# Patient Record
Sex: Female | Born: 1940 | Race: Black or African American | Hispanic: No | Smoking: Former smoker
Health system: Southern US, Community
[De-identification: ages and names within clinical notes are randomized; demographics above are authoritative.]

## PROBLEM LIST (undated history)

## (undated) DIAGNOSIS — R569 Unspecified convulsions: Secondary | ICD-10-CM

## (undated) DIAGNOSIS — R059 Cough, unspecified: Secondary | ICD-10-CM

## (undated) DIAGNOSIS — N183 Chronic kidney disease, stage 3 unspecified: Secondary | ICD-10-CM

## (undated) DIAGNOSIS — I10 Essential (primary) hypertension: Secondary | ICD-10-CM

## (undated) DIAGNOSIS — J189 Pneumonia, unspecified organism: Secondary | ICD-10-CM

## (undated) DIAGNOSIS — N179 Acute kidney failure, unspecified: Secondary | ICD-10-CM

## (undated) DIAGNOSIS — I639 Cerebral infarction, unspecified: Secondary | ICD-10-CM

## (undated) DIAGNOSIS — I509 Heart failure, unspecified: Secondary | ICD-10-CM

## (undated) DIAGNOSIS — G40909 Epilepsy, unspecified, not intractable, without status epilepticus: Secondary | ICD-10-CM

## (undated) DIAGNOSIS — F17200 Nicotine dependence, unspecified, uncomplicated: Secondary | ICD-10-CM

## (undated) HISTORY — DX: Acute kidney failure, unspecified: N17.9

## (undated) HISTORY — PX: NO PAST SURGERIES: SHX2092

## (undated) HISTORY — DX: Chronic kidney disease, stage 3 unspecified: N18.30

## (undated) HISTORY — DX: Pneumonia, unspecified organism: J18.9

## (undated) HISTORY — DX: Cerebral infarction, unspecified: I63.9

## (undated) HISTORY — DX: Unspecified convulsions: R56.9

## (undated) HISTORY — DX: Cough, unspecified: R05.9

---

## 2001-03-10 ENCOUNTER — Emergency Department (HOSPITAL_COMMUNITY): Admission: EM | Admit: 2001-03-10 | Discharge: 2001-03-10 | Payer: Self-pay | Admitting: Emergency Medicine

## 2005-07-20 ENCOUNTER — Emergency Department (HOSPITAL_COMMUNITY): Admission: EM | Admit: 2005-07-20 | Discharge: 2005-07-20 | Payer: Self-pay | Admitting: Emergency Medicine

## 2005-07-20 ENCOUNTER — Ambulatory Visit: Payer: Self-pay | Admitting: Family Medicine

## 2005-10-31 ENCOUNTER — Encounter: Payer: Self-pay | Admitting: Internal Medicine

## 2005-10-31 ENCOUNTER — Inpatient Hospital Stay (HOSPITAL_COMMUNITY): Admission: EM | Admit: 2005-10-31 | Discharge: 2005-11-03 | Payer: Self-pay | Admitting: Internal Medicine

## 2005-11-01 ENCOUNTER — Encounter (INDEPENDENT_AMBULATORY_CARE_PROVIDER_SITE_OTHER): Payer: Self-pay | Admitting: Interventional Cardiology

## 2005-11-01 ENCOUNTER — Encounter: Payer: Self-pay | Admitting: Vascular Surgery

## 2007-06-28 ENCOUNTER — Other Ambulatory Visit: Admission: RE | Admit: 2007-06-28 | Discharge: 2007-06-28 | Payer: Self-pay | Admitting: Family Medicine

## 2008-06-10 ENCOUNTER — Inpatient Hospital Stay (HOSPITAL_COMMUNITY): Admission: EM | Admit: 2008-06-10 | Discharge: 2008-06-11 | Payer: Self-pay | Admitting: Emergency Medicine

## 2008-06-10 ENCOUNTER — Emergency Department (HOSPITAL_COMMUNITY): Admission: EM | Admit: 2008-06-10 | Discharge: 2008-06-10 | Payer: Self-pay | Admitting: Family Medicine

## 2010-05-11 LAB — CBC
HCT: 32.5 % — ABNORMAL LOW (ref 36.0–46.0)
HCT: 37.8 % (ref 36.0–46.0)
Hemoglobin: 11.2 g/dL — ABNORMAL LOW (ref 12.0–15.0)
Hemoglobin: 12.9 g/dL (ref 12.0–15.0)
MCHC: 34.2 g/dL (ref 30.0–36.0)
MCHC: 34.5 g/dL (ref 30.0–36.0)
MCV: 96.5 fL (ref 78.0–100.0)
MCV: 97.1 fL (ref 78.0–100.0)
Platelets: 250 10*3/uL (ref 150–400)
Platelets: 272 10*3/uL (ref 150–400)
RBC: 3.35 MIL/uL — ABNORMAL LOW (ref 3.87–5.11)
RBC: 3.92 MIL/uL (ref 3.87–5.11)
RDW: 16.7 % — ABNORMAL HIGH (ref 11.5–15.5)
RDW: 16.8 % — ABNORMAL HIGH (ref 11.5–15.5)
WBC: 5.7 10*3/uL (ref 4.0–10.5)
WBC: 7.1 10*3/uL (ref 4.0–10.5)

## 2010-05-11 LAB — DIFFERENTIAL
Basophils Absolute: 0 10*3/uL (ref 0.0–0.1)
Basophils Relative: 1 % (ref 0–1)
Eosinophils Absolute: 0.3 10*3/uL (ref 0.0–0.7)
Eosinophils Relative: 5 % (ref 0–5)
Lymphocytes Relative: 19 % (ref 12–46)
Lymphs Abs: 1.1 10*3/uL (ref 0.7–4.0)
Monocytes Absolute: 0.5 10*3/uL (ref 0.1–1.0)
Monocytes Relative: 8 % (ref 3–12)
Neutro Abs: 3.9 10*3/uL (ref 1.7–7.7)
Neutrophils Relative %: 68 % (ref 43–77)

## 2010-05-11 LAB — BASIC METABOLIC PANEL
BUN: 24 mg/dL — ABNORMAL HIGH (ref 6–23)
CO2: 28 mEq/L (ref 19–32)
Calcium: 9.1 mg/dL (ref 8.4–10.5)
Chloride: 102 mEq/L (ref 96–112)
Creatinine, Ser: 1.07 mg/dL (ref 0.4–1.2)
GFR calc Af Amer: >60 mL/min (ref >60)
GFR calc non Af Amer: 51 mL/min — ABNORMAL LOW (ref >60)
Glucose, Bld: 90 mg/dL (ref 70–99)
Potassium: 4.6 mEq/L (ref 3.5–5.1)
Sodium: 137 mEq/L (ref 135–145)

## 2010-05-11 LAB — LIPASE, BLOOD: Lipase: 36 U/L (ref 11–59)

## 2010-05-11 LAB — POCT I-STAT, CHEM 8
BUN: 26 mg/dL — ABNORMAL HIGH (ref 6–23)
Calcium, Ion: 1.03 mmol/L — ABNORMAL LOW (ref 1.12–1.32)
Chloride: 108 mEq/L (ref 96–112)
Creatinine, Ser: 1.2 mg/dL (ref 0.4–1.2)
Glucose, Bld: 84 mg/dL (ref 70–99)
HCT: 42 % (ref 36.0–46.0)
Hemoglobin: 14.3 g/dL (ref 12.0–15.0)
Potassium: 5.1 mEq/L (ref 3.5–5.1)
Sodium: 136 mEq/L (ref 135–145)
TCO2: 21 mmol/L (ref 0–100)

## 2010-05-11 LAB — POCT CARDIAC MARKERS
CKMB, poc: 2.3 ng/mL (ref 1.0–8.0)
Myoglobin, poc: 146 ng/mL (ref 12–200)
Troponin i, poc: <0.05 ng/mL (ref 0.00–0.09)

## 2010-06-15 NOTE — Discharge Summary (Signed)
[name], [name]               ACCOUNT NO.:  [id number]   MEDICAL RECORD NO.:  [id number]          PATIENT TYPE:  INP   LOCATION:  4743                         FACILITY:  [facility]   PHYSICIAN:  Corky Crafts, MDDATE OF BIRTH:  19-Aug-1940   DATE OF ADMISSION:  06/10/2008  DATE OF DISCHARGE:  06/11/2008                               DISCHARGE SUMMARY   DISCHARGE DIAGNOSES:  1. Chest pain, noncardiac with normal coronaries.  2. Hypertension.  3. Long-term medication use.  4. History of ovarian cyst.  5. History of prior heart surgery as a child.  6. Smoking, smoking cessation counseling.   HOSPITAL COURSE:  [name] is a 70 year old female who awoke yesterday  with severe substernal chest pain and pressure and nausea.  She waited  several hours and went to Urgent Care around 11:30 a.m.  She was then  transferred to [facility].  Her EKG showed anterior Q-waves with ST-  segment elevation in leads V3-V2.  This was worrisome for late  presenting STEMI.  Q-waves were present.  She was taken then emergently  to the Cardiac Catheterization Lab where she had no significant coronary  artery disease.  Her LV function was normal.  She had no abdominal  aortic aneurysm and her hemodynamics were normal.  This was felt to be  noncardiac in nature.  She was kept in the hospital overnight.   LABORATORY DATA:  Her lab studies showed a sodium of 137, potassium 4.6,  BUN 24, creatinine 1.07, and glucose 90.   DISCHARGE MEDICATIONS:  She is to resume her lisinopril as prior to  admission and baby aspirin 81 mg a day.   DISCHARGE INSTRUCTIONS:  Remain on the low-sodium heart-healthy diet.  Clean the groin site gently with soap and water.  Increase her activity  slowly.  No lifting over 10 pounds for 1 week.  No driving for 2 days.  Follow up with Dr. Eldridge Dace on July 02, 2008 at 3:30 p.m.      Guy Franco, P.A.      Corky Crafts, MD  Electronically Signed    LB/MEDQ  D:   06/11/2008  T:  06/12/2008  Job:  [id number]

## 2010-06-15 NOTE — H&P (Signed)
[name], [name]               ACCOUNT NO.:  [id number]   MEDICAL RECORD NO.:  [id number]           PATIENT TYPE:   LOCATION:                                 FACILITY:   PHYSICIAN:  Corky Crafts, MD     DATE OF BIRTH:   DATE OF ADMISSION:  DATE OF DISCHARGE:                              HISTORY & PHYSICAL   PRIMARY CARE PHYSICIAN:  Brett Canales A. Daub, MD   CHIEF COMPLAINT:  Chest discomfort with EKG changes.   HISTORY OF PRESENT ILLNESS:  The patient is a 70 year old woman who woke  up this morning about 4 a.m. with severe chest pressure and nausea.  She  waited several hours despite severe pain and went to an Urgent Care  Clinic around 11:30 this morning.  She was transferred to the [facility]  Emergency Room.  There was a question of ST elevation in the anterior  leads with Q-waves and code STEMI was activated.  She still having about  1/10 chest pain just prior to the catheterization.   PAST MEDICAL HISTORY:  Hypertension.   PAST SURGICAL HISTORY:  Ovarian cyst, prior heart surgery as a child.   ALLERGIES:  No known drug allergies.   MEDICATIONS:  Lisinopril and aspirin 325 mg day.   SOCIAL HISTORY:  She smokes a half-pack per day, but smoked much more in  the past.  She rarely drinks alcohol.  She does drink beer.  She lives  alone.  She works at [employer].   FAMILY HISTORY:  No coronary artery disease.   REVIEW OF SYSTEMS:  Significant for the chest pain and nausea as  mentioned above.  She also has leg cramps on a regular basis.  No recent  swelling, shortness of breath, or syncope.  All other systems negative.   PHYSICAL EXAMINATION:  VITAL SIGNS:  Blood pressure is 155/100, heart  rate of 72.  GENERAL:  She is awake and alert in no apparent distress.  HEAD:  Normocephalic, atraumatic.  EYES:  Extraocular movements intact.  NECK:  No JVD.  CARDIOVASCULAR:  Regular rate and rhythm.  LUNGS:  No audible wheezing.  ABDOMEN:  Soft, nontender.  EXTREMITIES:  2+  right femoral pulse.  No edema.  NEURO:  No focal deficits.  SKIN:  No rash.  PSYCH:  Normal mood and affect.   ECG shows possible right atrial enlargement, anterior Q-waves with poor  R-wave progression, 1-mm ST elevation in leads V2 and V3, prolonged QT  interval.   ASSESSMENT:  A 70 year old with chest pain and EKG changes worrisome for  acute myocardial infarction.   PLAN:  The risks and benefits of cardiac cath were briefly explained to  the patient and she is agreeable to the procedure.  Her course will be  determined by the results of the catheterization regardless she should  stop smoking.  We will continue lisinopril to control her blood  pressure.      Corky Crafts, MD  Electronically Signed     JSV/MEDQ  D:  06/10/2008  T:  06/11/2008  Job:  [id number]

## 2010-06-15 NOTE — Cardiovascular Report (Signed)
[name], [name]               ACCOUNT NO.:  [id number]   MEDICAL RECORD NO.:  [id number]          PATIENT TYPE:  INP   LOCATION:  4743                         FACILITY:  [facility]   PHYSICIAN:  Corky Crafts, MDDATE OF BIRTH:  March 02, 1940   DATE OF PROCEDURE:  DATE OF DISCHARGE:                            CARDIAC CATHETERIZATION   PROCEDURES PERFORMED:  Left heart catheterization, left ventriculogram,  coronary angiogram, abdominal aortogram.   OPERATOR:  Corky Crafts, MD   INDICATIONS:  Suspect anterior STEMI.   PROCEDURE NARRATIVE:  The patient was brought emergently to the cath lab  for angiography.  Her right groin was prepped and draped in the usual  sterile fashion.  A 6-French sheath was placed into the right femoral  artery using the modified Seldinger technique.  Right coronary artery  angiography was performed using a JR-4 pigtail catheter.  The catheter  was advanced to the vessel ostium under fluoroscopic guidance.  Digital  angiography was performed using hand injection of contrast.  Left  coronary artery angiography was performed using JL-4 pigtail catheter.  Catheter was advanced to the vessel ostium under fluoroscopic guidance.  Digital angiography was performed in multiple projections using hand  injection of contrast.  Pigtail catheter was advanced to the ascending  aorta and across the aortic valve under fluoroscopic guidance.  The  catheter was used for a power injection to image the left ventricle.  The catheter was pulled back under continuous hemodynamic pressure  monitoring.  Catheter was then withdrawn to the abdominal aorta and a  power injection of contrast was performed.  The sheath will be removed  using manual compression.   FINDINGS:  1. The right coronary artery is a small nondominant vessel but is      patent.  2. The left main artery is widely patent.  3. The left circumflex is a large-dominant vessel with mild  irregularities.  The OM-1 and OM-2 are small vessels which are      widely patent.  The OM-3 is a medium-sized vessel which is widely      patent.  4. The left anterior descending is a large vessel which wraps around      the apex.  There are mild irregularities in the mid vessel.  There      is a large first diagonal which is angiographically normal.  5. The left ventriculogram shows normal ventricular function with an      LVEF of 55%.  6. Left ventricular pressure 94/0 with an LVEDP of 5 mmHg.  Aortic      pressure 91/57 with a median aortic pressure of 72 mmHg.  7. The abdominal aortogram showed no abdominal aortic aneurysm.  There      is a single left renal artery which is patent.  There is a single      right renal artery which had a 25% proximal stenosis.   IMPRESSION:  1. No significant coronary artery disease.  2. Normal ventricular function.  3. No abdominal aortic aneurysm.  4. Normal hemodynamics.   RECOMMENDATIONS:  We will watch the patient overnight.  If there are no  complications, we will plan to discharge her in the morning.  She needs  aggressive risk factor modification including smoking cessation, blood  pressure control, and lipid control.  Additional history obtained after  the procedure showed that the patient had some type of childhood heart  surgery that may explain why she has a slightly abnormal EKG.      Corky Crafts, MD  Electronically Signed     JSV/MEDQ  D:  06/10/2008  T:  06/11/2008  Job:  [id number]

## 2010-06-18 NOTE — H&P (Signed)
NAMEKIP, KAUTZMAN               ACCOUNT NO.:  [id number]   MEDICAL RECORD NO.:  [id number]          PATIENT TYPE:  INP   LOCATION:  0101                         FACILITY:  [facility]   PHYSICIAN:  Melissa L. Ladona Ridgel, MD  DATE OF BIRTH:  1940-03-11   DATE OF ADMISSION:  10/31/2005  DATE OF DISCHARGE:                                HISTORY & PHYSICAL   CHIEF COMPLAINT:  I could not write and I was dropping stuff.   PRIMARY CARE PHYSICIAN:  Dr. Pollie Friar over at the [facility]gent [facility] on [address].   HISTORY OF PRESENT ILLNESS:  The patient is a 70 year old, African-American  female with a past medical history for hypertension.  She is left-handed.  She states that yesterday evening, she does not recall what time it was, but  she noticed that she was dropping things with her right hand.  She states  that she did not think much of it until she went to bed and, when she got up  this morning, and went to write out her bills, she was unable to write  clearly.  She knew that could mean a stroke and went to the Urgent Care for  evaluation and was requested to come to the emergency room for high blood  pressure.   REVIEW OF SYSTEMS:  The patient says she has had no fever or chills, nausea,  vomiting, or diarrhea, although she has had no pathological weight loss.  She has never had these symptoms before.  All other review of systems were  negative.   PAST MEDICAL HISTORY:  Hypertension.   PAST SURGICAL HISTORY:  Heart surgery at the age of 4.   SOCIAL HISTORY:  She smokes about a pack of cigarettes over a week.  She  drinks a beer every now and again, every week or so.  She denies any illicit  drug use.  She works as a Glass blower/designer in a Secretary/administrator.   FAMILY HISTORY:  Mom is unknown.  Dad is deceased; he had no medical  illnesses that she could recall.  She has 3 children and is divorced.   ALLERGIES:  No known drug allergies.   MEDICATIONS:  Hydrochlorothiazide, which was discontinued in  June because  her potassium was low.  She is currently not taking any medications.   VITAL SIGNS:  Temperature is 97.3.  Blood pressure is 151/92.  Pulse 78.  Respirations 20.  Saturation 98%.  GENERAL:  This is a frail, moderately nourished, African-American female in  no acute distress.  She is normocephalic, atraumatic.  Pupils equal round and reactive to light.  Extraocular muscles are intact.  Mucous membranes are moist.  NECK:  Supple.  There is no JVD.  No lymph nodes.  No carotid bruits.  CHEST:  Clear to auscultation.  There are no rhonchi, rales, or wheezes.  CARDIOVASCULAR:  Regular rate rhythm.  Positive S1, S2.  No S3, S4.  No  murmurs, rubs, or gallops.  ABDOMEN:  Soft, nontender, nondistended with positive bowel sounds.  EXTREMITIES:  Showed no clubbing, cyanosis, or edema.  She did experience  some weakness with keeping her right hand on her shoulder.  She was,  however, able to push it actively out.  The left arm showed pushing  difficulties but she was able to keep that arm up.  The patient had no ocular defects.  She does have some sensory dysesthesia  over her right face and some right facial droop.  Power is otherwise 5/5.  DTRs are 2.  Plantars are downgoing bilaterally.   PERTINENT LABORATORY VALUES:  White count is 4.1, hemoglobin 13.4,  hematocrit 39.4, and platelets of 348.  Her sodium is 132, potassium 4.4,  chloride 97, CO2 27. BUN is 12.  Creatinine is 1.2 and glucose is 94.  Urinalysis is negative.   ASSESSMENT AND PLAN:  A 70 year old, African-American female with a past  medical history for hypertension who states that she used to be on  hydrochlorothiazide but this was discontinued in June.  The patient was in  her usual state of health until last p.m. when she noticed that she was  dropping things, mainly with her right hand, but then this morning, she woke  up and was unable to sign her checks with her left hand.  The patient is  also noted to have  a right facial droop.   1. Cardiovascular hypertension, poorly controlled.  We are going to start      her on an angiotensin-converting enzyme inhibitor and put her on      aspirin.  We will obtain a MRI of the brain to look at the distribution      of possible stroke.  We will do carotid ultrasounds and a 2-D      echocardiogram.  We also will check stroke workup, labs with      homocystine, fasting lipid panel, thyroid study, hemoglobin A1c.  2. Pulmonary factors.  Cessation counseling for her tobacco abuse.  3. Gastrointestinal.  She has no current issues.  4. Genitourinary.  She has a negative urinalysis with no complaints.  5. Endocrine.  Check hemoglobin A1c and a TSH.  6. Deep venous thrombosis prophylaxis.  This will be initially with      ambulation.  If she stays for a protracted course, we can add Lovenox.      Melissa L. Ladona Ridgel, MD  Electronically Signed     MLT/MEDQ  D:  10/31/2005  T:  10/31/2005  Job:  [id number]   cc:   Denzil Magnuson  Fax: [phone]

## 2010-06-18 NOTE — Discharge Summary (Signed)
NAMESHERELL, [name]               ACCOUNT NO.:  [id number]   MEDICAL RECORD NO.:  [id number]          PATIENT TYPE:  INP   LOCATION:  3703                         FACILITY:  [facility]   PHYSICIAN:  Jackie Plum, M.D.DATE OF BIRTH:  08-12-1940   DATE OF ADMISSION:  10/31/2005  DATE OF DISCHARGE:  11/03/2005                                 DISCHARGE SUMMARY   DISCHARGE DIAGNOSES:  1. Cerebrovascular accident.  2. Uncontrolled hypertension.  3. Cigarette smoking.  4. Hyperhomocysteinemia.   DIAGNOSTIC WORKUP:  Of note, MRI and MRA of the brain done, indicated acute  internal capsule and corona radiata stroke.  Carotid Doppler did not reveal  any ICA stenosis.  Two-D echocardiogram revealed EF of 50% without any  cardiac emboli.  Homocysteine level was marginally elevated and Foltx was  started.   Patient was evaluated by PT and OT and found not to need any acute  inpatient.  Patient was also evaluated by cigarette smoking cessation team  and Taylor indicated the willingness to stop smoking cigarette on her own.  Lipid panel was done in the hospital and tested for total cholesterol of  167, HDL of 84, LDL of 55, triglycerides of 90.  TSH was 1.160.  Homocysteine level was 16.2.   DISCHARGE LABS:  WBC count 5.2, hemoglobin 12.5, hematocrit 36.8, MCV 100.6,  platelet count 299.  Sodium 138, potassium 4.0, chloride 105, CO2 23,  glucose 91, BUN 17, creatinine 1.2, calcium 9.7, hemoglobin A1c 5.4%.   CONSULTATIONS:  Patient was seen by stroke team.   PROCEDURE:  Not applicable.   CONDITION ON DISCHARGE:  Improved, satisfactory.   REASON FOR ADMISSION:  Acute CVA.   The patient presented with clumsiness of the left hand, making it difficult  for her to write.  On admission, by Dr. Ladona Ridgel, the patient was said to have  some weakness with her right hand.  There was also to be some sensory  dysphagia over her right face with some right facial droop.  Otherwise,  power was intact.   Her chemistries were unremarkable.  Further evaluation  with a CT scan was negative for acute stroke and MRI confirmed that.  Cholesterol workup was done, which showed the above results.  The patient  stroke is to due to complaints of her hypertension, which is uncontrolled,  as well as cigarette smoking and received expedious discussions and  counseling in this regard.  The patient had a 2D echocardiogram, which did  not reveal any ___________ emboli.  Taylor does not have any cardiac stenosis.   Taylor is going to be discharged on aspirin and Foltx.  We have discharged her  on Prinivil and HCTZ and Taylor will need followup of her blood pressure.  On  rounds, the patient denied any fevers or chills, nausea or vomiting.  Taylor  had been seen by occupational therapy and did very well and has very limited  difficulties with her hand.  Her BP was  105/91.  Pulse 78.  Respirations 20.  Temp 98.9.  O2 saturation of 100% on  room air.  We have added HCTZ  to her medication regimen and Taylor will need to  check up on her blood pressure at [facility] and follow up with her PCP in a  couple of weeks.  The patient was discharged in stable, satisfactory  condition.      Jackie Plum, M.D.  Electronically Signed     GO/MEDQ  D:  11/03/2005  T:  11/03/2005  Job:  [id number]

## 2010-06-18 NOTE — Discharge Summary (Signed)
NAMEBERNADETT, [name]               ACCOUNT NO.:  [id number]   MEDICAL RECORD NO.:  [id number]          PATIENT TYPE:  EMS   LOCATION:  MAJO                         FACILITY:  [facility]   PHYSICIAN:  Broadus John T. Pickard II, MDDATE OF BIRTH:  1940-04-27   DATE OF ADMISSION:  DATE OF DISCHARGE:  07/20/2005                                 DISCHARGE SUMMARY   CHIEF COMPLAINT:  Hyponatremia, weakness, question Addisonian crisis.   HISTORY OF PRESENT ILLNESS:  The patient is a 70 year old African-American  female who [facility] with right-sided musculoskeletal  pain about 1 week ago.  She had an x-ray negative for rib fracture, but a  BMET shows sodium of 123.  She was asked to come back for a repeat BMET but  never did.  Today at work she felt numbness and tingling in her right shin  and calf and also said that she did not feel right and was somewhat confused  and disoriented.  Apparently she was not confused or disoriented to get in  her car to drive over to [facility].  She was evaluated there and  due to her symptoms and laboratory findings there was concern for possible  Addisonian crisis as well as the hyperpigmentation in her face.  She was  sent to the ED.  She was still complaining of right lower rib pain that was  reproduced with palpation of the rib.  She denies pleuritic chest pain,  cough or shortness of breath.  She also denies nausea, vomiting or diarrhea.  She does report poor p.o. intake over the last few days.  In the ED she was  found to have a creatinine of 1.5 and some mildly decreased skin turgor and  dark dry mucous membranes.   PAST MEDICAL HISTORY:  1.  Hypertension.  2.  Tobacco dependence.  3.  Leg cramps.   PAST SURGICAL HISTORY:  Ovarian cyst, removed 30 years ago.   ALLERGIES:  APPARENTLY SHE GETS A RASH WITH ACE INHIBITORS.  HOWEVER, SHE  HAS LISINOPRIL LISTED WITH HER MEDICINES.   MEDICATIONS:  1.  Hydrochlorothiazide 25 mg  daily.  2.  Ultram 50 mg daily started last week.  3.  Lisinopril 10 mg daily.  She is carrying this bottle with her.   SOCIAL HISTORY:  She is divorced.  She works in a [employer]. She has 3 grown  children.  She has a 30+ pack year history of tobacco, occasional alcohol  use but no drug history.   FAMILY HISTORY:  Her father died in his 72's with heart disease.  Mother is  deceased of unknown reasons.   PHYSICAL EXAMINATION:  VITAL SIGNS: Temperature 97.1, pulse 68, blood  pressure 163/95, respirations 20, saturation 98% on room air.  GENERAL: A thin elderly African-American female in no apparent distress.  She is awake, alert and oriented x3. HEENT:  Pupils are equal, round and  reactive to light.  EOMI.  Mucous membranes are somewhat dry.  NECK:  No lymphadenopathy.  No thyromegaly.  No JVD.  No bruits.  CARDIOVASCULAR:  Regular rate,  no murmurs.  PULMONARY:  Clear to auscultation bilaterally.  CHEST:  She has pain to the right inferior lateral ribs with palpation that  is easily reproducible.  ABDOMEN: Soft, non-tender, not distended, positive bowel sounds.  No  guarding, no rebound.  EXTREMITIES:  No cyanosis, clubbing or edema.  NEUROLOGIC:  Cranial nerves 2 through 12  are intact.  Extremity strength  5/5.  Normal reflexes.  Normal sensation in upper and lower extremities.  SKIN:  She has a hypopigmented area in the nasolabial folds on the tip of  her nose.  No hyperpigmentation in her palmar creases.   LABORATORY DATA:  Sodium 127, potassium 4.4, chloride 92, bicarbonate 26,  BUN 26, creatinine 1.5, glucose 93.  LFT's are within normal limits.  CBC:  Hemoglobin 12.2, white blood cell count 4.3, platelets 301.  Urinalysis does  reveal a low specific gravity of 1.004 but is otherwise normal.  TSH,  Cortisol, serum osmolality, urine osmolality, urine sodium and urine  creatinine are all pending at the time of discharge.   Chest x-ray shows nothing acute with mild scarring in  the left base.  Abdominal x-ray is normal with no acute findings.   ASSESSMENT:  A 70 year old African-American female with the following:  1.  Hyponatremia.  Does not appear consistent with Addison's at this time      given she has a normal potassium and has been hypertensive.  She does      appear somewhat dehydrated.  However, her specific gravity does not      coincide with this.  We are going to instruct her to hold her      hydrochlorothiazide as well as her lisinopril for now.  She was given 1      liter of normal saline in the ER and was feeling better.  We will need      to follow up her laboratory data, and she will see Dr. Cleta Alberts as an      outpatient in the next week.  2.  Right rib pain.  Chest x-ray is negative.  This appears to be      musculoskeletal in nature.  She has been given Tramadol in the past.      She was given a prescription for Flexeril 5 mg, #15 with 1 refill to use      for relaxation at night.  3.  Possible acute renal failure.  Again we were holding her      hydrochlorothiazide and giving her fluids. She has been instructed to      avoid all NSAID's, and we are holding her ACE inhibitor as well, and      this will also need to be followed up as an outpatient.  4.  Hypertension.  Again, we are holding her blood pressure meds.  She is      hypertensive in the ED.  This will need to be followed up.  We would      consider Norvasc as a first line agent for her blood pressure if she is      unable to tolerate these other agents.  5.  Lower extremity cramps.  Unknown etiology.  They seem to be resolved      right now.  Her potassium is normal right now as well, but that it      likely playing a roll.  This could also be due to restless leg syndrome.   Dr. McDiarmid was present for evaluation and assessment of  the patient and  is in agreement with the assessment and plan.      Broadus John T. Pamalee Leyden, MD    WTP/MEDQ  D:  07/20/2005  T:  07/20/2005  Job:   [id number]   cc:   Attention:  Dr. Ellis [facility]  [address]

## 2012-04-16 ENCOUNTER — Emergency Department (HOSPITAL_COMMUNITY): Payer: Medicare Other

## 2012-04-16 ENCOUNTER — Encounter (HOSPITAL_COMMUNITY): Payer: Self-pay | Admitting: Emergency Medicine

## 2012-04-16 ENCOUNTER — Emergency Department (HOSPITAL_COMMUNITY)
Admission: EM | Admit: 2012-04-16 | Discharge: 2012-04-16 | Disposition: A | Discharge status: Home / Self Care | Payer: Medicare Other | Attending: Emergency Medicine | Admitting: Emergency Medicine

## 2012-04-16 DIAGNOSIS — I10 Essential (primary) hypertension: Secondary | ICD-10-CM

## 2012-04-16 DIAGNOSIS — S22009A Unspecified fracture of unspecified thoracic vertebra, initial encounter for closed fracture: Secondary | ICD-10-CM | POA: Insufficient documentation

## 2012-04-16 DIAGNOSIS — Y929 Unspecified place or not applicable: Secondary | ICD-10-CM | POA: Insufficient documentation

## 2012-04-16 DIAGNOSIS — Z7982 Long term (current) use of aspirin: Secondary | ICD-10-CM | POA: Insufficient documentation

## 2012-04-16 DIAGNOSIS — S22000A Wedge compression fracture of unspecified thoracic vertebra, initial encounter for closed fracture: Secondary | ICD-10-CM

## 2012-04-16 DIAGNOSIS — Z9889 Other specified postprocedural states: Secondary | ICD-10-CM | POA: Insufficient documentation

## 2012-04-16 DIAGNOSIS — M549 Dorsalgia, unspecified: Secondary | ICD-10-CM | POA: Insufficient documentation

## 2012-04-16 DIAGNOSIS — X58XXXA Exposure to other specified factors, initial encounter: Secondary | ICD-10-CM | POA: Insufficient documentation

## 2012-04-16 DIAGNOSIS — Y939 Activity, unspecified: Secondary | ICD-10-CM | POA: Insufficient documentation

## 2012-04-16 DIAGNOSIS — Z79899 Other long term (current) drug therapy: Secondary | ICD-10-CM | POA: Insufficient documentation

## 2012-04-16 DIAGNOSIS — I252 Old myocardial infarction: Secondary | ICD-10-CM | POA: Insufficient documentation

## 2012-04-16 HISTORY — DX: Essential (primary) hypertension: I10

## 2012-04-16 LAB — COMPREHENSIVE METABOLIC PANEL
ALT: 26 U/L (ref 0–35)
AST: 34 U/L (ref 0–37)
Albumin: 4.6 g/dL (ref 3.5–5.2)
Alkaline Phosphatase: 97 U/L (ref 39–117)
BUN: 20 mg/dL (ref 6–23)
CO2: 23 mEq/L (ref 19–32)
Calcium: 10.1 mg/dL (ref 8.4–10.5)
Chloride: 94 mEq/L — ABNORMAL LOW (ref 96–112)
Creatinine, Ser: 1.23 mg/dL — ABNORMAL HIGH (ref 0.50–1.10)
GFR calc Af Amer: 51 mL/min — ABNORMAL LOW (ref >90)
GFR calc non Af Amer: 44 mL/min — ABNORMAL LOW (ref >90)
Glucose, Bld: 109 mg/dL — ABNORMAL HIGH (ref 70–99)
Potassium: 4.6 mEq/L (ref 3.5–5.1)
Sodium: 131 mEq/L — ABNORMAL LOW (ref 135–145)
Total Bilirubin: 0.5 mg/dL (ref 0.3–1.2)
Total Protein: 9.2 g/dL — ABNORMAL HIGH (ref 6.0–8.3)

## 2012-04-16 LAB — POCT I-STAT, CHEM 8
BUN: 22 mg/dL (ref 6–23)
Calcium, Ion: 1.05 mmol/L — ABNORMAL LOW (ref 1.13–1.30)
Chloride: 103 mEq/L (ref 96–112)
Creatinine, Ser: 1.4 mg/dL — ABNORMAL HIGH (ref 0.50–1.10)
Glucose, Bld: 111 mg/dL — ABNORMAL HIGH (ref 70–99)
HCT: 48 % — ABNORMAL HIGH (ref 36.0–46.0)
Hemoglobin: 16.3 g/dL — ABNORMAL HIGH (ref 12.0–15.0)
Potassium: 4.6 mEq/L (ref 3.5–5.1)
Sodium: 131 mEq/L — ABNORMAL LOW (ref 135–145)
TCO2: 22 mmol/L (ref 0–100)

## 2012-04-16 LAB — URINALYSIS, ROUTINE W REFLEX MICROSCOPIC
Bilirubin Urine: NEGATIVE
Glucose, UA: NEGATIVE mg/dL
Ketones, ur: NEGATIVE mg/dL
Leukocytes, UA: NEGATIVE
Nitrite: NEGATIVE
Protein, ur: 100 mg/dL — AB
Specific Gravity, Urine: 1.012 (ref 1.005–1.030)
Urobilinogen, UA: 0.2 mg/dL (ref 0.0–1.0)
pH: 5.5 (ref 5.0–8.0)

## 2012-04-16 LAB — CBC WITH DIFFERENTIAL/PLATELET
Basophils Absolute: 0 10*3/uL (ref 0.0–0.1)
Basophils Relative: 0 % (ref 0–1)
Eosinophils Absolute: 0 10*3/uL (ref 0.0–0.7)
Eosinophils Relative: 0 % (ref 0–5)
HCT: 40.8 % (ref 36.0–46.0)
Hemoglobin: 13.8 g/dL (ref 12.0–15.0)
Lymphocytes Relative: 7 % — ABNORMAL LOW (ref 12–46)
Lymphs Abs: 0.6 10*3/uL — ABNORMAL LOW (ref 0.7–4.0)
MCH: 31.4 pg (ref 26.0–34.0)
MCHC: 33.8 g/dL (ref 30.0–36.0)
MCV: 92.7 fL (ref 78.0–100.0)
Monocytes Absolute: 0.5 10*3/uL (ref 0.1–1.0)
Monocytes Relative: 6 % (ref 3–12)
Neutro Abs: 6.8 10*3/uL (ref 1.7–7.7)
Neutrophils Relative %: 87 % — ABNORMAL HIGH (ref 43–77)
Platelets: 257 10*3/uL (ref 150–400)
RBC: 4.4 MIL/uL (ref 3.87–5.11)
RDW: 15 % (ref 11.5–15.5)
WBC: 7.9 10*3/uL (ref 4.0–10.5)

## 2012-04-16 LAB — URINE MICROSCOPIC-ADD ON

## 2012-04-16 LAB — TROPONIN I
Troponin I: <0.3 ng/mL (ref <0.30)
Troponin I: <0.3 ng/mL (ref <0.30)

## 2012-04-16 LAB — RAPID URINE DRUG SCREEN, HOSP PERFORMED
Amphetamines: NOT DETECTED
Barbiturates: NOT DETECTED
Benzodiazepines: NOT DETECTED
Cocaine: NOT DETECTED
Opiates: NOT DETECTED
Tetrahydrocannabinol: NOT DETECTED

## 2012-04-16 LAB — D-DIMER, QUANTITATIVE: D-Dimer, Quant: 3.87 ug/mL-FEU — ABNORMAL HIGH (ref 0.00–0.48)

## 2012-04-16 MED ORDER — MORPHINE SULFATE 4 MG/ML IJ SOLN
4.0000 mg | Freq: Once | INTRAMUSCULAR | Status: AC
  Administered 2012-04-16: 4 mg via INTRAVENOUS
  Filled 2012-04-16: qty 1

## 2012-04-16 MED ORDER — GI COCKTAIL ~~LOC~~
30.0000 mL | Freq: Once | ORAL | Status: AC
  Administered 2012-04-16: 30 mL via ORAL
  Filled 2012-04-16: qty 30

## 2012-04-16 MED ORDER — AMLODIPINE BESYLATE 5 MG PO TABS
5.0000 mg | ORAL_TABLET | Freq: Every day | ORAL | Status: DC | PRN
  Administered 2012-04-16: 5 mg via ORAL
  Filled 2012-04-16: qty 1

## 2012-04-16 MED ORDER — ONDANSETRON HCL 4 MG/2ML IJ SOLN
4.0000 mg | Freq: Once | INTRAMUSCULAR | Status: AC
  Administered 2012-04-16: 4 mg via INTRAVENOUS
  Filled 2012-04-16: qty 2

## 2012-04-16 MED ORDER — OXYCODONE-ACETAMINOPHEN 5-325 MG PO TABS: 2.0000 | ORAL_TABLET | ORAL | Status: DC | PRN

## 2012-04-16 MED ORDER — OXYCODONE-ACETAMINOPHEN 5-325 MG PO TABS
2.0000 | ORAL_TABLET | Freq: Once | ORAL | Status: AC
  Administered 2012-04-16: 2 via ORAL
  Filled 2012-04-16: qty 2

## 2012-04-16 MED ORDER — AMLODIPINE BESYLATE 5 MG PO TABS: 5.0000 mg | ORAL_TABLET | Freq: Every day | ORAL | Status: DC | PRN

## 2012-04-16 MED ORDER — IOHEXOL 350 MG/ML SOLN
100.0000 mL | Freq: Once | INTRAVENOUS | Status: AC | PRN
  Administered 2012-04-16: 100 mL via INTRAVENOUS

## 2012-04-16 MED ORDER — LABETALOL HCL 5 MG/ML IV SOLN
20.0000 mg | Freq: Once | INTRAVENOUS | Status: AC
  Administered 2012-04-16: 20 mg via INTRAVENOUS
  Filled 2012-04-16: qty 4

## 2012-04-16 NOTE — ED Notes (Signed)
[phone] Leonette Most friend for ride.

## 2012-04-16 NOTE — ED Provider Notes (Signed)
History     CSN: [id number]  Arrival date & time 04/16/12  7829   First MD Initiated Contact with Patient 04/16/12 [phone]      Chief Complaint  Patient presents with  . Chest Pain    (Consider location/radiation/quality/duration/timing/severity/associated sxs/prior treatment) HPI Comments: Patient presents with substernal chest pain has been constant since 1 AM. It radiates to her mid back. It is worse with palpation and worse with breathing. She has not had pain like this in the past. She denies any nausea, vomiting, shortness of breath or diaphoresis. She reports cardiac surgery as a child and thinks she had an MI several years ago. Denies any other medical problems. Denies any fevers, chills, leg pain or swelling.  The history is provided by the EMS personnel and the patient. The history is limited by the condition of the patient.    Past Medical History  Diagnosis Date  . MI (myocardial infarction)   . Hypertension     No past surgical history on file.  No family history on file.  History  Substance Use Topics  . Smoking status: Not on file  . Smokeless tobacco: Not on file  . Alcohol Use: Not on file    OB History   Grav Para Term Preterm Abortions TAB SAB Ect Mult Living                  Review of Systems  Constitutional: Negative for fever, activity change and appetite change.  HENT: Negative for congestion and rhinorrhea.   Respiratory: Positive for chest tightness. Negative for cough and shortness of breath.   Cardiovascular: Positive for chest pain.  Gastrointestinal: Negative for nausea, vomiting and abdominal pain.  Genitourinary: Negative for dysuria.  Musculoskeletal: Positive for back pain.  Skin: Negative for wound.  Neurological: Negative for dizziness, weakness and headaches.  A complete 10 system review of systems was obtained and all systems are negative except as noted in the HPI and PMH.    Allergies  Review of patient's allergies indicates  no known allergies.  Home Medications   Current Outpatient Rx  Name  Route  Sig  Dispense  Refill  . aspirin EC 81 MG tablet   Oral   Take 81 mg by mouth daily.         Marland Kitchen amLODipine (NORVASC) 5 MG tablet   Oral   Take 1 tablet (5 mg total) by mouth daily as needed (for blood pressure).   30 tablet   0   . oxyCODONE-acetaminophen (PERCOCET/ROXICET) 5-325 MG per tablet   Oral   Take 2 tablets by mouth every 4 (four) hours as needed for pain.   15 tablet   0     BP 169/88  Pulse 64  Resp 16  SpO2 96%  Physical Exam  Constitutional: She is oriented to person, place, and time. She appears well-developed and well-nourished.  Uncomfortable  HENT:  Head: Normocephalic and atraumatic.  Mouth/Throat: Oropharynx is clear and moist. No oropharyngeal exudate.  Eyes: Conjunctivae and EOM are normal. Pupils are equal, round, and reactive to light.  Neck: Normal range of motion. Neck supple.  Cardiovascular: Normal rate, regular rhythm and normal heart sounds.   No murmur heard. Equal femoral, DP and PT pulses  Pulmonary/Chest: Effort normal and breath sounds normal. No respiratory distress. She exhibits tenderness.  Abdominal: Soft. There is no tenderness. There is no rebound and no guarding.  Musculoskeletal: Normal range of motion. She exhibits tenderness.  Paraspinal thoracic back  tenderness and midline tenderness  5/5 strength in bilateral lower extremities. Ankle plantar and dorsiflexion intact. Great toe extension intact bilaterally. +2 DP and PT pulses. +2 patellar reflexes bilaterally. Normal gait.   Neurological: She is alert and oriented to person, place, and time. No cranial nerve deficit. She exhibits normal muscle tone. Coordination normal.  Equal grip strength bilaterally.  Skin: Skin is warm.    ED Course  Procedures (including critical care time)  Labs Reviewed  CBC WITH DIFFERENTIAL - Abnormal; Notable for the following:    Neutrophils Relative 87 (*)     Lymphocytes Relative 7 (*)    Lymphs Abs 0.6 (*)    All other components within normal limits  COMPREHENSIVE METABOLIC PANEL - Abnormal; Notable for the following:    Sodium 131 (*)    Chloride 94 (*)    Glucose, Bld 109 (*)    Creatinine, Ser 1.23 (*)    Total Protein 9.2 (*)    GFR calc non Af Amer 44 (*)    GFR calc Af Amer 51 (*)    All other components within normal limits  URINALYSIS, ROUTINE W REFLEX MICROSCOPIC - Abnormal; Notable for the following:    Hgb urine dipstick SMALL (*)    Protein, ur 100 (*)    All other components within normal limits  D-DIMER, QUANTITATIVE - Abnormal; Notable for the following:    D-Dimer, Quant 3.87 (*)    All other components within normal limits  POCT I-STAT, CHEM 8 - Abnormal; Notable for the following:    Sodium 131 (*)    Creatinine, Ser 1.40 (*)    Glucose, Bld 111 (*)    Calcium, Ion 1.05 (*)    Hemoglobin 16.3 (*)    HCT 48.0 (*)    All other components within normal limits  TROPONIN I  URINE RAPID DRUG SCREEN (HOSP PERFORMED)  URINE MICROSCOPIC-ADD ON  TROPONIN I   Dg Chest 2 View  04/16/2012  *RADIOLOGY REPORT*  Clinical Data: Chest pain.  Short of breath.  Weakness.  CHEST - 2 VIEW  Comparison: 06/10/2008  Findings: Mild cardiomegaly and ectasia thoracic aorta are stable. Decreased lung volumes are seen, however both lungs are clear.  No evidence of pleural effusion.  No mass or lymphadenopathy identified.  IMPRESSION: Stable mild cardiomegaly and aortic ectasia.  No active lung disease.   Original Report Authenticated By: Myles Rosenthal, M.D.    Ct Angio Chest Aortic Dissect W &/or W/o  04/16/2012  *RADIOLOGY REPORT*  Clinical Data:  Right chest pain, mid back pain  CT ANGIOGRAPHY CHEST, ABDOMEN AND PELVIS  Technique:  Multidetector CT imaging through the chest, abdomen and pelvis was performed using the standard protocol during bolus administration of intravenous contrast.  Multiplanar reconstructed images including MIPs were  obtained and reviewed to evaluate the vascular anatomy.  Contrast: , OMNIPAQUE IOHEXOL 350 MG/ML SOLN  Comparison:  Chest radiographs dated 04/16/2012  CTA CHEST  Findings:  No evidence of intramural hematoma.  No evidence of thoracic aortic aneurysm or dissection.  Mild atherosclerotic calcifications of the aortic arch.  No evidence of pulmonary embolism.  Evaluation of the lung parenchyma is constrained by respiratory motion.  Dependent atelectasis in the bilateral lower lobes.  Mild linear scarring versus atelectasis in the left lower lobe. No pleural effusion or pneumothorax.  The visualized thyroid is grossly unremarkable.  The heart is normal in size.  No pericardial effusion.  No suspicious mediastinal, hilar, or axillary lymphadenopathy.  Mild superior endplate  compression deformity at T6 (sagittal image 34), age indeterminate, possibly acute.  Very mild superior endplate changes at T7.   Review of the MIP images confirms the above findings.  IMPRESSION: No evidence of thoracic aortic aneurysm or dissection.  No evidence of pulmonary embolism.  Mild superior endplate compression deformity at T6, age indeterminate, possibly acute.  CTA ABDOMEN AND PELVIS  Findings:  No evidence of abdominal aortic aneurysm or dissection. Atherosclerotic calcifications of the abdominal aorta and branch vessels, including at the origin of the SMA (series 5/image 129). Celiac artery, SMA, and IMA are patent.  Liver, spleen, pancreas, and adrenal glands are unremarkable.  Gallbladder is within normal limits.  No intrahepatic or extrahepatic ductal dilatation.  Kidneys are grossly unremarkable.  No hydronephrosis.  No evidence of bowel obstruction.  No abdominopelvic ascites.  No suspicious abdominopelvic lymphadenopathy.  Uterus is notable for a probable small calcified fibroid in the right fundus (series 5/image 243).  No adnexal masses.  Bladder is within normal limits.  Very mild degenerative changes of the lumbar spine.    Review of the MIP images confirms the above findings.  IMPRESSION: No evidence of abdominal aortic aneurysm or dissection.   Original Report Authenticated By: Charline Bills, M.D.    Ct Angio Abd/pel W/ And/or W/o  04/16/2012  *RADIOLOGY REPORT*  Clinical Data:  Right chest pain, mid back pain  CT ANGIOGRAPHY CHEST, ABDOMEN AND PELVIS  Technique:  Multidetector CT imaging through the chest, abdomen and pelvis was performed using the standard protocol during bolus administration of intravenous contrast.  Multiplanar reconstructed images including MIPs were obtained and reviewed to evaluate the vascular anatomy.  Contrast: , OMNIPAQUE IOHEXOL 350 MG/ML SOLN  Comparison:  Chest radiographs dated 04/16/2012  CTA CHEST  Findings:  No evidence of intramural hematoma.  No evidence of thoracic aortic aneurysm or dissection.  Mild atherosclerotic calcifications of the aortic arch.  No evidence of pulmonary embolism.  Evaluation of the lung parenchyma is constrained by respiratory motion.  Dependent atelectasis in the bilateral lower lobes.  Mild linear scarring versus atelectasis in the left lower lobe. No pleural effusion or pneumothorax.  The visualized thyroid is grossly unremarkable.  The heart is normal in size.  No pericardial effusion.  No suspicious mediastinal, hilar, or axillary lymphadenopathy.  Mild superior endplate compression deformity at T6 (sagittal image 21), age indeterminate, possibly acute.  Very mild superior endplate changes at T7.   Review of the MIP images confirms the above findings.  IMPRESSION: No evidence of thoracic aortic aneurysm or dissection.  No evidence of pulmonary embolism.  Mild superior endplate compression deformity at T6, age indeterminate, possibly acute.  CTA ABDOMEN AND PELVIS  Findings:  No evidence of abdominal aortic aneurysm or dissection. Atherosclerotic calcifications of the abdominal aorta and branch vessels, including at the origin of the SMA (series 5/image  129). Celiac artery, SMA, and IMA are patent.  Liver, spleen, pancreas, and adrenal glands are unremarkable.  Gallbladder is within normal limits.  No intrahepatic or extrahepatic ductal dilatation.  Kidneys are grossly unremarkable.  No hydronephrosis.  No evidence of bowel obstruction.  No abdominopelvic ascites.  No suspicious abdominopelvic lymphadenopathy.  Uterus is notable for a probable small calcified fibroid in the right fundus (series 5/image 243).  No adnexal masses.  Bladder is within normal limits.  Very mild degenerative changes of the lumbar spine.   Review of the MIP images confirms the above findings.  IMPRESSION: No evidence of abdominal aortic aneurysm or dissection.  Original Report Authenticated By: Charline Bills, M.D.      1. Thoracic compression fracture, closed, initial encounter   2. Hypertension       MDM  Acute onset of chest pain and rates to the back of his been constant for 7 hours. Somewhat reproducible. EKG unchanged from previous. Concern for aortic dissection given hypertension description of pain.  Chest x-ray clear with ectatic aorta. CT angiogram of chest shows no evidence of aortic dissection or aneurysm. No evidence of pulmonary embolism.  Blood pressure has improved with treatment. Patient's pain is improved as well with morphine GI cocktail. Clean catheterization in 2010. D/w Dr. Mayford Knife of [facility] cardiology who agrees with outpatient followup and blood pressure control. Delta troponin negative.  BP improved to 160s at discharge. Compliance with medications stressed.  Compressions fracture noted on CT which may be source of patient's pain.     Date: 04/16/2012  Rate: 81  Rhythm: normal sinus rhythm  QRS Axis: left  Intervals: normal  ST/T Wave abnormalities: normal  Conduction Disutrbances:none  Narrative Interpretation: poor R wave progression, 1 mm STE in v2, v3, unchanged from 2010 EKG per Dr. Hoyle Barr dictation.  Old EKG Reviewed: none  available    Glynn Octave, MD 04/16/12 [phone]

## 2012-04-16 NOTE — ED Notes (Signed)
Per ems- pt reports woke up at 1am with cramping cp. Currently R sided cp and mid back pain. Difficult to move around due to pain. Pain increases with breathing. Hx of MI. ems administered 324 asa. 12 lead unremarkable. Decreased lung sounds on right. r- 24. 98% RA. bp- 140/110. Denies nv, sob. Pain 7/10.

## 2012-04-27 ENCOUNTER — Other Ambulatory Visit: Payer: Self-pay | Admitting: Family Medicine

## 2012-04-27 DIAGNOSIS — M549 Dorsalgia, unspecified: Secondary | ICD-10-CM

## 2012-05-01 ENCOUNTER — Ambulatory Visit
Admission: RE | Admit: 2012-05-01 | Discharge: 2012-05-01 | Disposition: A | Discharge status: Home / Self Care | Payer: Medicare Other | Source: Ambulatory Visit | Attending: Family Medicine | Admitting: Family Medicine

## 2012-05-01 DIAGNOSIS — M549 Dorsalgia, unspecified: Secondary | ICD-10-CM

## 2012-12-28 ENCOUNTER — Inpatient Hospital Stay (HOSPITAL_COMMUNITY): Payer: Medicare Other

## 2012-12-28 ENCOUNTER — Encounter (HOSPITAL_COMMUNITY): Payer: Self-pay | Admitting: Emergency Medicine

## 2012-12-28 ENCOUNTER — Emergency Department (HOSPITAL_COMMUNITY): Payer: Medicare Other

## 2012-12-28 ENCOUNTER — Inpatient Hospital Stay (HOSPITAL_COMMUNITY)
Admission: EM | Admit: 2012-12-28 | Discharge: 2013-01-04 | DRG: 065 | Disposition: A | Discharge status: Rehabilitation Facility | Payer: Medicare Other | Attending: Internal Medicine | Admitting: Internal Medicine

## 2012-12-28 DIAGNOSIS — G40909 Epilepsy, unspecified, not intractable, without status epilepticus: Secondary | ICD-10-CM | POA: Diagnosis present

## 2012-12-28 DIAGNOSIS — I6529 Occlusion and stenosis of unspecified carotid artery: Secondary | ICD-10-CM | POA: Diagnosis present

## 2012-12-28 DIAGNOSIS — E872 Acidosis, unspecified: Secondary | ICD-10-CM

## 2012-12-28 DIAGNOSIS — I1310 Hypertensive heart and chronic kidney disease without heart failure, with stage 1 through stage 4 chronic kidney disease, or unspecified chronic kidney disease: Secondary | ICD-10-CM

## 2012-12-28 DIAGNOSIS — R569 Unspecified convulsions: Secondary | ICD-10-CM

## 2012-12-28 DIAGNOSIS — Z8673 Personal history of transient ischemic attack (TIA), and cerebral infarction without residual deficits: Secondary | ICD-10-CM

## 2012-12-28 DIAGNOSIS — I6350 Cerebral infarction due to unspecified occlusion or stenosis of unspecified cerebral artery: Secondary | ICD-10-CM

## 2012-12-28 DIAGNOSIS — I639 Cerebral infarction, unspecified: Secondary | ICD-10-CM

## 2012-12-28 DIAGNOSIS — G9340 Encephalopathy, unspecified: Secondary | ICD-10-CM

## 2012-12-28 DIAGNOSIS — G8190 Hemiplegia, unspecified affecting unspecified side: Secondary | ICD-10-CM | POA: Diagnosis present

## 2012-12-28 DIAGNOSIS — Z7982 Long term (current) use of aspirin: Secondary | ICD-10-CM

## 2012-12-28 DIAGNOSIS — I428 Other cardiomyopathies: Secondary | ICD-10-CM | POA: Diagnosis present

## 2012-12-28 DIAGNOSIS — I10 Essential (primary) hypertension: Secondary | ICD-10-CM

## 2012-12-28 DIAGNOSIS — I252 Old myocardial infarction: Secondary | ICD-10-CM

## 2012-12-28 DIAGNOSIS — K5900 Constipation, unspecified: Secondary | ICD-10-CM | POA: Diagnosis present

## 2012-12-28 DIAGNOSIS — N179 Acute kidney failure, unspecified: Secondary | ICD-10-CM

## 2012-12-28 HISTORY — DX: Hypertensive heart and chronic kidney disease without heart failure, with stage 1 through stage 4 chronic kidney disease, or unspecified chronic kidney disease: I13.10

## 2012-12-28 HISTORY — DX: Encephalopathy, unspecified: G93.40

## 2012-12-28 HISTORY — DX: Acidosis, unspecified: E87.20

## 2012-12-28 HISTORY — DX: Acidosis: E87.2

## 2012-12-28 HISTORY — DX: Unspecified convulsions: R56.9

## 2012-12-28 HISTORY — DX: Acute kidney failure, unspecified: N17.9

## 2012-12-28 LAB — CBC
HCT: 43.2 % (ref 36.0–46.0)
Hemoglobin: 14.4 g/dL (ref 12.0–15.0)
MCH: 33.8 pg (ref 26.0–34.0)
MCHC: 33.3 g/dL (ref 30.0–36.0)
MCV: 101.4 fL — ABNORMAL HIGH (ref 78.0–100.0)
Platelets: 281 10*3/uL (ref 150–400)
RBC: 4.26 MIL/uL (ref 3.87–5.11)
RDW: 16.3 % — ABNORMAL HIGH (ref 11.5–15.5)
WBC: 10.4 10*3/uL (ref 4.0–10.5)

## 2012-12-28 LAB — POCT I-STAT 3, ART BLOOD GAS (G3+)
Acid-base deficit: 4 mmol/L — ABNORMAL HIGH (ref 0.0–2.0)
Bicarbonate: 21.9 mEq/L (ref 20.0–24.0)
O2 Saturation: 99 %
TCO2: 23 mmol/L (ref 0–100)
pCO2 arterial: 40.8 mmHg (ref 35.0–45.0)
pH, Arterial: 7.339 — ABNORMAL LOW (ref 7.350–7.450)
pO2, Arterial: 142 mmHg — ABNORMAL HIGH (ref 80.0–100.0)

## 2012-12-28 LAB — CBC WITH DIFFERENTIAL/PLATELET
Basophils Absolute: 0 10*3/uL (ref 0.0–0.1)
Basophils Relative: 0 % (ref 0–1)
Eosinophils Absolute: 0.3 10*3/uL (ref 0.0–0.7)
Eosinophils Relative: 4 % (ref 0–5)
HCT: 40.7 % (ref 36.0–46.0)
Hemoglobin: 13.5 g/dL (ref 12.0–15.0)
Lymphocytes Relative: 35 % (ref 12–46)
Lymphs Abs: 3.1 10*3/uL (ref 0.7–4.0)
MCH: 33 pg (ref 26.0–34.0)
MCHC: 33.2 g/dL (ref 30.0–36.0)
MCV: 99.5 fL (ref 78.0–100.0)
Monocytes Absolute: 1 10*3/uL (ref 0.1–1.0)
Monocytes Relative: 12 % (ref 3–12)
Neutro Abs: 4.4 10*3/uL (ref 1.7–7.7)
Neutrophils Relative %: 50 % (ref 43–77)
Platelets: 284 10*3/uL (ref 150–400)
RBC: 4.09 MIL/uL (ref 3.87–5.11)
RDW: 16.1 % — ABNORMAL HIGH (ref 11.5–15.5)
WBC: 8.9 10*3/uL (ref 4.0–10.5)

## 2012-12-28 LAB — BASIC METABOLIC PANEL
BUN: 22 mg/dL (ref 6–23)
BUN: 17 mg/dL (ref 6–23)
CO2: 20 mEq/L (ref 19–32)
CO2: 18 mEq/L — ABNORMAL LOW (ref 19–32)
Calcium: 9.3 mg/dL (ref 8.4–10.5)
Calcium: 8.9 mg/dL (ref 8.4–10.5)
Chloride: 98 mEq/L (ref 96–112)
Chloride: 100 mEq/L (ref 96–112)
Creatinine, Ser: 1.16 mg/dL — ABNORMAL HIGH (ref 0.50–1.10)
Creatinine, Ser: 1.14 mg/dL — ABNORMAL HIGH (ref 0.50–1.10)
GFR calc Af Amer: 54 mL/min — ABNORMAL LOW (ref >90)
GFR calc Af Amer: 55 mL/min — ABNORMAL LOW (ref >90)
GFR calc non Af Amer: 47 mL/min — ABNORMAL LOW (ref >90)
GFR calc non Af Amer: 48 mL/min — ABNORMAL LOW (ref >90)
Glucose, Bld: 83 mg/dL (ref 70–99)
Glucose, Bld: 95 mg/dL (ref 70–99)
Potassium: 5.4 mEq/L — ABNORMAL HIGH (ref 3.5–5.1)
Potassium: 4.7 mEq/L (ref 3.5–5.1)
Sodium: 133 mEq/L — ABNORMAL LOW (ref 135–145)
Sodium: 134 mEq/L — ABNORMAL LOW (ref 135–145)

## 2012-12-28 LAB — COMPREHENSIVE METABOLIC PANEL
ALT: 18 U/L (ref 0–35)
AST: 28 U/L (ref 0–37)
Albumin: 4.3 g/dL (ref 3.5–5.2)
Alkaline Phosphatase: 95 U/L (ref 39–117)
BUN: 23 mg/dL (ref 6–23)
CO2: 15 mEq/L — ABNORMAL LOW (ref 19–32)
Calcium: 10 mg/dL (ref 8.4–10.5)
Chloride: 94 mEq/L — ABNORMAL LOW (ref 96–112)
Creatinine, Ser: 1.37 mg/dL — ABNORMAL HIGH (ref 0.50–1.10)
GFR calc Af Amer: 44 mL/min — ABNORMAL LOW (ref >90)
GFR calc non Af Amer: 38 mL/min — ABNORMAL LOW (ref >90)
Glucose, Bld: 140 mg/dL — ABNORMAL HIGH (ref 70–99)
Potassium: 3.5 mEq/L (ref 3.5–5.1)
Sodium: 136 mEq/L (ref 135–145)
Total Bilirubin: 0.5 mg/dL (ref 0.3–1.2)
Total Protein: 8.4 g/dL — ABNORMAL HIGH (ref 6.0–8.3)

## 2012-12-28 LAB — ETHANOL: Alcohol, Ethyl (B): <11 mg/dL (ref 0–11)

## 2012-12-28 LAB — POCT I-STAT TROPONIN I: Troponin i, poc: 0.01 ng/mL (ref 0.00–0.08)

## 2012-12-28 LAB — POCT I-STAT, CHEM 8
BUN: 23 mg/dL (ref 6–23)
Calcium, Ion: 1.23 mmol/L (ref 1.13–1.30)
Chloride: 102 mEq/L (ref 96–112)
Creatinine, Ser: 1.6 mg/dL — ABNORMAL HIGH (ref 0.50–1.10)
Glucose, Bld: 141 mg/dL — ABNORMAL HIGH (ref 70–99)
HCT: 49 % — ABNORMAL HIGH (ref 36.0–46.0)
Hemoglobin: 16.7 g/dL — ABNORMAL HIGH (ref 12.0–15.0)
Potassium: 3.6 mEq/L (ref 3.5–5.1)
Sodium: 139 mEq/L (ref 135–145)
TCO2: 17 mmol/L (ref 0–100)

## 2012-12-28 LAB — RAPID URINE DRUG SCREEN, HOSP PERFORMED
Amphetamines: NOT DETECTED
Barbiturates: NOT DETECTED
Benzodiazepines: NOT DETECTED
Cocaine: NOT DETECTED
Opiates: NOT DETECTED
Tetrahydrocannabinol: NOT DETECTED

## 2012-12-28 LAB — URINALYSIS, ROUTINE W REFLEX MICROSCOPIC
Bilirubin Urine: NEGATIVE
Glucose, UA: NEGATIVE mg/dL
Hgb urine dipstick: NEGATIVE
Ketones, ur: NEGATIVE mg/dL
Leukocytes, UA: NEGATIVE
Nitrite: NEGATIVE
Protein, ur: 100 mg/dL — AB
Specific Gravity, Urine: 1.013 (ref 1.005–1.030)
Urobilinogen, UA: 0.2 mg/dL (ref 0.0–1.0)
pH: 5.5 (ref 5.0–8.0)

## 2012-12-28 LAB — URINE MICROSCOPIC-ADD ON

## 2012-12-28 LAB — CREATININE, SERUM
Creatinine, Ser: 1.4 mg/dL — ABNORMAL HIGH (ref 0.50–1.10)
GFR calc Af Amer: 43 mL/min — ABNORMAL LOW (ref >90)
GFR calc non Af Amer: 37 mL/min — ABNORMAL LOW (ref >90)

## 2012-12-28 LAB — LACTIC ACID, PLASMA: Lactic Acid, Venous: 1.3 mmol/L (ref 0.5–2.2)

## 2012-12-28 LAB — MRSA PCR SCREENING: MRSA by PCR: NEGATIVE

## 2012-12-28 MED ORDER — SODIUM CHLORIDE 0.9 % IV SOLN
20.0000 mg/kg | Freq: Once | INTRAVENOUS | Status: AC
  Administered 2012-12-28: 800 mg via INTRAVENOUS
  Filled 2012-12-28: qty 16

## 2012-12-28 MED ORDER — ONDANSETRON HCL 4 MG/2ML IJ SOLN: 4.0000 mg | Freq: Four times a day (QID) | INTRAMUSCULAR | Status: DC | PRN

## 2012-12-28 MED ORDER — SODIUM CHLORIDE 0.9 % IV SOLN
INTRAVENOUS | Status: DC
  Administered 2012-12-28 – 2012-12-30 (×3): via INTRAVENOUS

## 2012-12-28 MED ORDER — ENOXAPARIN SODIUM 30 MG/0.3ML ~~LOC~~ SOLN
30.0000 mg | SUBCUTANEOUS | Status: DC
Start: 2012-12-28 — End: 2013-01-02
  Administered 2012-12-28 – 2013-01-01 (×5): 30 mg via SUBCUTANEOUS
  Filled 2012-12-28 (×6): qty 0.3

## 2012-12-28 MED ORDER — SODIUM CHLORIDE 0.9 % IV SOLN
1000.0000 mg | INTRAVENOUS | Status: AC
  Administered 2012-12-28: 1000 mg via INTRAVENOUS
  Filled 2012-12-28: qty 10

## 2012-12-28 MED ORDER — PHENYTOIN SODIUM 50 MG/ML IJ SOLN
100.0000 mg | Freq: Three times a day (TID) | INTRAMUSCULAR | Status: DC
  Administered 2012-12-29 – 2013-01-02 (×13): 100 mg via INTRAVENOUS
  Filled 2012-12-28 (×16): qty 2

## 2012-12-28 MED ORDER — BIOTENE DRY MOUTH MT LIQD
15.0000 mL | OROMUCOSAL | Status: DC
  Administered 2012-12-29 – 2013-01-04 (×28): 15 mL via OROMUCOSAL

## 2012-12-28 MED ORDER — HYDROCODONE-ACETAMINOPHEN 5-325 MG PO TABS
1.0000 | ORAL_TABLET | ORAL | Status: DC | PRN
  Administered 2012-12-29 – 2012-12-30 (×3): 2 via ORAL
  Administered 2012-12-31: 1 via ORAL
  Administered 2012-12-31: 2 via ORAL
  Administered 2013-01-01: 1 via ORAL
  Filled 2012-12-28 (×2): qty 2
  Filled 2012-12-28 (×2): qty 1
  Filled 2012-12-28 (×2): qty 2

## 2012-12-28 MED ORDER — ASPIRIN 300 MG RE SUPP
300.0000 mg | Freq: Every day | RECTAL | Status: DC
  Administered 2012-12-28 – 2012-12-29 (×2): 300 mg via RECTAL
  Filled 2012-12-28 (×3): qty 1

## 2012-12-28 MED ORDER — LORAZEPAM 2 MG/ML IJ SOLN
1.0000 mg | Freq: Once | INTRAMUSCULAR | Status: AC
  Administered 2012-12-28: 1 mg via INTRAVENOUS

## 2012-12-28 MED ORDER — SODIUM CHLORIDE 0.9 % IV SOLN
1000.0000 mg | Freq: Two times a day (BID) | INTRAVENOUS | Status: DC
  Administered 2012-12-29 – 2013-01-02 (×9): 1000 mg via INTRAVENOUS
  Filled 2012-12-28 (×11): qty 10

## 2012-12-28 MED ORDER — ONDANSETRON HCL 4 MG PO TABS: 4.0000 mg | ORAL_TABLET | Freq: Four times a day (QID) | ORAL | Status: DC | PRN

## 2012-12-28 MED ORDER — SODIUM CHLORIDE 0.9 % IV SOLN
500.0000 mg | Freq: Two times a day (BID) | INTRAVENOUS | Status: DC
  Administered 2012-12-28: 500 mg via INTRAVENOUS
  Filled 2012-12-28 (×2): qty 5

## 2012-12-28 MED ORDER — HYDRALAZINE HCL 20 MG/ML IJ SOLN
5.0000 mg | Freq: Four times a day (QID) | INTRAMUSCULAR | Status: DC | PRN
  Administered 2012-12-28: 5 mg via INTRAVENOUS
  Administered 2012-12-29: 13:00:00 via INTRAVENOUS
  Filled 2012-12-28 (×2): qty 1

## 2012-12-28 MED ORDER — LORAZEPAM 2 MG/ML IJ SOLN
1.0000 mg | Freq: Once | INTRAMUSCULAR | Status: AC
  Administered 2012-12-28: 1 mg via INTRAVENOUS
  Filled 2012-12-28: qty 1

## 2012-12-28 NOTE — Consult Note (Signed)
Reason for Consult: Generalized seizure.  HPI:                                                                                                                                          [name] is an 72 y.o. female with a history of previous stroke, hypertension and myocardial infarction who was brought to the emergency room following a witnessed generalized seizure at home. Patient has no previous history of seizure activity. Her only complaint within the last week has been back pain. No recurrence of seizure activity has been noted since arriving in the emergency room. CT scan of her head showed no acute intracranial abnormality. She has remained obtunded for the most part. She has been been afebrile. Laboratory studies were unremarkable, including electrolytes. Urine drug screen is pending. Blood glucose was 140. She has not been febrile.  Past Medical History  Diagnosis Date  . MI (myocardial infarction)   . Hypertension     History reviewed. No pertinent past surgical history.  No family history on file.  Social History:  reports that she has never smoked. She does not have any smokeless tobacco history on file. She reports that she does not drink alcohol or use illicit drugs.  No Known Allergies  MEDICATIONS:                                                                                                                     I have reviewed the patient's current medications.   ROS:                                                                                                                                       History obtained from child  General ROS: negative for - chills, fatigue, fever, night sweats, weight gain or  weight loss Psychological ROS: negative for - behavioral disorder, hallucinations, memory difficulties, mood swings or suicidal ideation Ophthalmic ROS: negative for - blurry vision, double vision, eye pain or loss of vision ENT ROS: negative for - epistaxis,  nasal discharge, oral lesions, sore throat, tinnitus or vertigo Allergy and Immunology ROS: negative for - hives or itchy/watery eyes Hematological and Lymphatic ROS: negative for - bleeding problems, bruising or swollen lymph nodes Endocrine ROS: negative for - galactorrhea, hair pattern changes, polydipsia/polyuria or temperature intolerance Respiratory ROS: negative for - cough, hemoptysis, shortness of breath or wheezing Cardiovascular ROS: negative for - chest pain, dyspnea on exertion, edema or irregular heartbeat Gastrointestinal ROS: negative for - abdominal pain, diarrhea, hematemesis, nausea/vomiting or stool incontinence Genito-Urinary ROS: negative for - dysuria, hematuria, incontinence or urinary frequency/urgency Musculoskeletal ROS: Back pain over the past one week Neurological ROS: as noted in HPI Dermatological ROS: negative for rash and skin lesion changes   Blood pressure 129/86, pulse 107, temperature 98.1 F (36.7 C), temperature source Rectal, resp. rate 19, SpO2 93.00%.   Neurologic Examination:                                                                                                      Patient was stuporous and only minimally arousable. She did not follow any verbal commands. She appeared to be in no acute distress. Pupils were 2 mm and equal and reacted minimally to light. Extraocular movements were intact with oculocephalic maneuvers. No facial weakness was noted. Muscle tone was flaccid throughout. Patient had no purposeful movements of her extremities. There was no abnormal posturing. Deep tendon reflexes were 1+ and symmetrical. Plantar responses were flexor bilaterally.  No results found for this basename: cbc, bmp, coags, chol, tri, ldl, hga1c    Results for orders placed during the hospital encounter of 12/28/12 (from the past 48 hour(s))  POCT I-STAT, CHEM 8     Status: Abnormal   Collection Time    12/28/12  1:55 AM      Result Value Range    Sodium 139  135 - 145 mEq/L   Potassium 3.6  3.5 - 5.1 mEq/L   Chloride 102  96 - 112 mEq/L   BUN 23  6 - 23 mg/dL   Creatinine, Ser 1.61 (*) 0.50 - 1.10 mg/dL   Glucose, Bld 096 (*) 70 - 99 mg/dL   Calcium, Ion 0.45  4.09 - 1.30 mmol/L   TCO2 17  0 - 100 mmol/L   Hemoglobin 16.7 (*) 12.0 - 15.0 g/dL   HCT 81.1 (*) 91.4 - 78.2 %  POCT I-STAT TROPONIN I     Status: None   Collection Time    12/28/12  1:57 AM      Result Value Range   Troponin i, poc 0.01  0.00 - 0.08 ng/mL   Comment 3            Comment: Due to the release kinetics of cTnI,     a negative result within the first hours     of the onset of symptoms  does not rule out     myocardial infarction with certainty.     If myocardial infarction is still suspected,     repeat the test at appropriate intervals.  CBC WITH DIFFERENTIAL     Status: Abnormal   Collection Time    12/28/12  2:17 AM      Result Value Range   WBC 8.9  4.0 - 10.5 K/uL   RBC 4.09  3.87 - 5.11 MIL/uL   Hemoglobin 13.5  12.0 - 15.0 g/dL   Comment: DELTA CHECK NOTED     REPEATED TO VERIFY   HCT 40.7  36.0 - 46.0 %   MCV 99.5  78.0 - 100.0 fL   MCH 33.0  26.0 - 34.0 pg   MCHC 33.2  30.0 - 36.0 g/dL   RDW 16.1 (*) 09.6 - 04.5 %   Platelets 284  150 - 400 K/uL   Neutrophils Relative % 50  43 - 77 %   Neutro Abs 4.4  1.7 - 7.7 K/uL   Lymphocytes Relative 35  12 - 46 %   Lymphs Abs 3.1  0.7 - 4.0 K/uL   Monocytes Relative 12  3 - 12 %   Monocytes Absolute 1.0  0.1 - 1.0 K/uL   Eosinophils Relative 4  0 - 5 %   Eosinophils Absolute 0.3  0.0 - 0.7 K/uL   Basophils Relative 0  0 - 1 %   Basophils Absolute 0.0  0.0 - 0.1 K/uL  COMPREHENSIVE METABOLIC PANEL     Status: Abnormal   Collection Time    12/28/12  2:17 AM      Result Value Range   Sodium 136  135 - 145 mEq/L   Potassium 3.5  3.5 - 5.1 mEq/L   Chloride 94 (*) 96 - 112 mEq/L   Comment: DELTA CHECK NOTED   CO2 15 (*) 19 - 32 mEq/L   Glucose, Bld 140 (*) 70 - 99 mg/dL   BUN 23  6 - 23  mg/dL   Creatinine, Ser 4.09 (*) 0.50 - 1.10 mg/dL   Calcium 81.1  8.4 - 91.4 mg/dL   Total Protein 8.4 (*) 6.0 - 8.3 g/dL   Albumin 4.3  3.5 - 5.2 g/dL   AST 28  0 - 37 U/L   ALT 18  0 - 35 U/L   Alkaline Phosphatase 95  39 - 117 U/L   Total Bilirubin 0.5  0.3 - 1.2 mg/dL   GFR calc non Af Amer 38 (*) >90 mL/min   GFR calc Af Amer 44 (*) >90 mL/min   Comment: (NOTE)     The eGFR has been calculated using the CKD EPI equation.     This calculation has not been validated in all clinical situations.     eGFR's persistently <90 mL/min signify possible Chronic Kidney     Disease.  ETHANOL     Status: None   Collection Time    12/28/12  2:17 AM      Result Value Range   Alcohol, Ethyl (B) <11  0 - 11 mg/dL   Comment:            LOWEST DETECTABLE LIMIT FOR     SERUM ALCOHOL IS 11 mg/dL     FOR MEDICAL PURPOSES ONLY    Ct Head Wo Contrast  12/28/2012   CLINICAL DATA:  Seizures.  EXAM: CT HEAD WITHOUT CONTRAST  TECHNIQUE: Contiguous axial images were obtained from the base of the skull  through the vertex without intravenous contrast.  COMPARISON:  CT head and MRI brain 10/31/2005  FINDINGS: Mild cerebral atrophy. No ventricular dilatation. Patchy low-attenuation changes in the deep white matter consistent with small vessel ischemia. No mass effect or midline shift. No abnormal extra-axial fluid collections. Gray-white matter junctions are distinct. Basal cisterns are not effaced. No evidence of acute intracranial hemorrhage. No depressed skull fractures. Opacification of the left sphenoid sinus, similar to previous study. Vascular calcifications. No significant changes since prior study.  IMPRESSION: No acute intracranial abnormalities. Mild chronic atrophy and small vessel ischemic change. Opacification of sphenoid sinus.   Electronically Signed   By: Burman Nieves M.D.   On: 12/28/2012 03:17   Dg Chest Port 1 View  12/28/2012   CLINICAL DATA:  Seizures.  EXAM: PORTABLE CHEST - 1 VIEW   COMPARISON:  04/16/2012  FINDINGS: Mild cardiac enlargement with normal pulmonary vascularity. Linear atelectasis in the lung bases. No focal consolidation. No blunting of costophrenic angles. No pneumothorax. Old right rib fracture.  IMPRESSION: Mild cardiac enlargement.  Atelectasis in the lung bases.   Electronically Signed   By: Burman Nieves M.D.   On: 12/28/2012 02:45   Assessment/Plan: 72 year old lady with a history of hypertension, myocardial infarction and previous stroke, presenting with generalized seizure with no history of previous seizure activity. Etiology for seizure activity is unclear. CT scan showed no acute intracranial abnormality. Recurrent stroke, however, cannot be ruled out at this point. Patient's obtunded status most likely manifestation of postictal state.  Recommendations: 1. MRI of the brain without contrast rule out recurrent acute ischemic stroke. 2. EEG, routine adult study. 3. No long-term anticonvulsant medication unless patient has a recurrent witnessed generalized seizure, or EEG shows indications of increased risk for recurrent seizure activity.  We will continue to follow this patient with you.  C.R. Roseanne Reno, MD [facility] [phone]  12/28/2012, 3:50 AM

## 2012-12-28 NOTE — ED Notes (Signed)
Patient back from MRI and placed back on cardiac monitor, patient in NAD at this time,

## 2012-12-28 NOTE — Care Management Note (Signed)
    Page 1 of 1   12/28/2012     2:42:01 PM   CARE MANAGEMENT NOTE 12/28/2012  Patient:  EVALYSE, STROOPE   Account Number:  [id number]  Date Initiated:  12/28/2012  Documentation initiated by:  Junius Creamer  Subjective/Objective Assessment:   adm w seizure     Action/Plan:   lives alone   Anticipated DC Date:     Anticipated DC Plan:           Choice offered to / List presented to:             Status of service:   Medicare Important Message given?   (If response is "NO", the following Medicare IM given date fields will be blank) Date Medicare IM given:   Date Additional Medicare IM given:    Discharge Disposition:    Per UR Regulation:  Reviewed for med. necessity/level of care/duration of stay  If discussed at Long Length of Stay Meetings, dates discussed:    Comments:

## 2012-12-28 NOTE — H&P (Signed)
[facility] History and Physical  [name] WUJ:[id number] DOB: 06-Sep-1940 DOA: 12/28/2012  Referring physician: Dr Loretha Stapler.  PCP: Default, Provider, MD  Specialists: Dr Roseanne Reno.  Chief Complaint: AMS, seizure.   HPI: [name] is a 72 y.o. female with PMH significant for hypertension, MI who was brought to ED by family member with AMS. History obtain from family. Patient unable to provide history due to AMS. Patient become confuse, not following command, weak. On her way to ED friend witness generalize muscle movement. The patient pass out. During initial evaluation by ED physician patient had eye deviation, and was not responsive. At that time ativan was give. Subsequently patient regain coherence over several hours. Later In the ED patient was notice to have localized movement of her mouth. She received second dose of  ativan and Keppra.  Patient appears sedated. She open eyes to loud voice.   Review of Systems: Negative, except as per HPI.   Past Medical History  Diagnosis Date  . MI (myocardial infarction)   . Hypertension    History reviewed. No pertinent past surgical history. Social History:  reports that she has never smoked. She does not have any smokeless tobacco history on file. She reports that she does not drink alcohol or use illicit drugs.   No Known Allergies Family History; Unable to obtain from patient.   Prior to Admission medications   Medication Sig Start Date End Date Taking? Authorizing Provider  aspirin EC 81 MG tablet Take 81 mg by mouth daily.   Yes Historical Provider, MD  amLODipine (NORVASC) 5 MG tablet Take 1 tablet (5 mg total) by mouth daily as needed (for blood pressure). 04/16/12   Glynn Octave, MD   Physical Exam: Filed Vitals:   12/28/12 0530  BP: 144/90  Pulse: 99  Temp:   Resp: 22   General Appearance:    Sedated, obtunded, respond to stimuli and voice.   Head:    Normocephalic, without obvious abnormality, atraumatic   Eyes:    PERLA.   Ears:    Normal TM's and external ear canals, both ears  Nose:   Nares normal, septum midline, mucosa normal, no drainage    or sinus tenderness  Throat:  unable to evaluate. Does not open mouth.   Neck:   Supple, symmetrical, trachea midline, no adenopathy;    thyroid:  no enlargement/tenderness/nodules; no carotid   bruit or JVD     Lungs:     Clear to auscultation bilaterally, respirations unlabored  Chest Wall:    No tenderness or deformity   Heart:    Regular rate and rhythm, S1 and S2 normal, systolic  murmur, rub or gallop     Abdomen:     Soft, non-tender, bowel sounds active all four quadrants,    no masses, no organomegaly        Extremities:   Extremities normal, atraumatic, no cyanosis or edema  Pulses:   2+ and symmetric all extremities  Skin:   Skin color, texture, turgor normal, no rashes or lesions     Neurologic:  limited neuro exam. Patient obtunded, open eyes to loud voice and stimuli. Produce resistance to movement of upper extremities.       Labs on Admission:  Basic Metabolic Panel:  Recent Labs Lab 12/28/12 0155 12/28/12 0217  NA 139 136  K 3.6 3.5  CL 102 94*  CO2  --  15*  GLUCOSE 141* 140*  BUN 23 23  CREATININE 1.60* 1.37*  CALCIUM  --  10.0   Liver Function Tests:  Recent Labs Lab 12/28/12 0217  AST 28  ALT 18  ALKPHOS 95  BILITOT 0.5  PROT 8.4*  ALBUMIN 4.3   No results found for this basename: LIPASE, AMYLASE,  in the last 168 hours No results found for this basename: AMMONIA,  in the last 168 hours CBC:  Recent Labs Lab 12/28/12 0155 12/28/12 0217  WBC  --  8.9  NEUTROABS  --  4.4  HGB 16.7* 13.5  HCT 49.0* 40.7  MCV  --  99.5  PLT  --  284   Cardiac Enzymes: No results found for this basename: CKTOTAL, CKMB, CKMBINDEX, TROPONINI,  in the last 168 hours  BNP (last 3 results) No results found for this basename: PROBNP,  in the last 8760 hours CBG: No results found for this basename: GLUCAP,  in  the last 168 hours  Radiological Exams on Admission: Ct Head Wo Contrast  12/28/2012   CLINICAL DATA:  Seizures.  EXAM: CT HEAD WITHOUT CONTRAST  TECHNIQUE: Contiguous axial images were obtained from the base of the skull through the vertex without intravenous contrast.  COMPARISON:  CT head and MRI brain 10/31/2005  FINDINGS: Mild cerebral atrophy. No ventricular dilatation. Patchy low-attenuation changes in the deep white matter consistent with small vessel ischemia. No mass effect or midline shift. No abnormal extra-axial fluid collections. Gray-white matter junctions are distinct. Basal cisterns are not effaced. No evidence of acute intracranial hemorrhage. No depressed skull fractures. Opacification of the left sphenoid sinus, similar to previous study. Vascular calcifications. No significant changes since prior study.  IMPRESSION: No acute intracranial abnormalities. Mild chronic atrophy and small vessel ischemic change. Opacification of sphenoid sinus.   Electronically Signed   By: Burman Nieves M.D.   On: 12/28/2012 03:17   Dg Chest Port 1 View  12/28/2012   CLINICAL DATA:  Seizures.  EXAM: PORTABLE CHEST - 1 VIEW  COMPARISON:  04/16/2012  FINDINGS: Mild cardiac enlargement with normal pulmonary vascularity. Linear atelectasis in the lung bases. No focal consolidation. No blunting of costophrenic angles. No pneumothorax. Old right rib fracture.  IMPRESSION: Mild cardiac enlargement.  Atelectasis in the lung bases.   Electronically Signed   By: Burman Nieves M.D.   On: 12/28/2012 02:45    EKG: Independently reviewed. Sinus Tachycardia  Assessment/Plan Active Problems:   Generalized seizure   Hypertension   Seizure   Metabolic acidosis   Encephalopathy   Acute renal failure  1-Encephalopathy/AMS: in setting of seizure and ativan. Admit to step down unit for better monitor. MRI to rule out stroke. UDS negative.  2-Seizure: had a second episode in the ED. Neuro recommend start  patient on  Keppra. EEG. MRI to rule out stroke.  3-Metabolic acidosis; IV fluids. Does not appears septic. Check lactic acid. Repeat b-met this afternoon. Check ABG.  4-Acute renal insufficiency: cr on admission at 1.6. Will continue with IV fluids. Repeat renal function.  5-Hypertension: hold BP medications.  6-DVT prophylaxis: Lovenox.   Code Status: Full Code.  Family Communication: care discussed with family at bedside.  Disposition Plan: admit to inpatient.   Time spent: 75 minutes.   Suhayb Anzalone [facility] Pager [phone]  If 7PM-7AM, please contact night-coverage [email or web address] Password [facility] 12/28/2012, 7:39 AM

## 2012-12-28 NOTE — ED Provider Notes (Signed)
CSN: [id number]     Arrival date & time 12/28/12  0147 History   First MD Initiated Contact with Patient 12/28/12 0222     Chief Complaint  Patient presents with  . Seizures   (Consider location/radiation/quality/duration/timing/severity/associated sxs/prior Treatment) Patient is a 72 y.o. female presenting with seizures.  Seizures Seizure activity on arrival: yes   Seizure type:  Grand mal Preceding symptoms comment:  Confusion Episode characteristics: confusion, eye deviation, generalized shaking and unresponsiveness   Postictal symptoms: confusion and somnolence   Severity:  Severe Duration:  10 minutes Timing:  Once Progression:  Unchanged Context: not fever   History of seizures: no     Past Medical History  Diagnosis Date  . MI (myocardial infarction)   . Hypertension    History reviewed. No pertinent past surgical history. No family history on file. History  Substance Use Topics  . Smoking status: Never Smoker   . Smokeless tobacco: Not on file  . Alcohol Use: No   OB History   Grav Para Term Preterm Abortions TAB SAB Ect Mult Living                 Review of Systems  Unable to perform ROS: Mental status change  Neurological: Positive for seizures.    Allergies  Review of patient's allergies indicates no known allergies.  Home Medications   Current Outpatient Rx  Name  Route  Sig  Dispense  Refill  . amLODipine (NORVASC) 5 MG tablet   Oral   Take 1 tablet (5 mg total) by mouth daily as needed (for blood pressure).   30 tablet   0   . aspirin EC 81 MG tablet   Oral   Take 81 mg by mouth daily.         Marland Kitchen oxyCODONE-acetaminophen (PERCOCET/ROXICET) 5-325 MG per tablet   Oral   Take 2 tablets by mouth every 4 (four) hours as needed for pain.   15 tablet   0    BP 117/78  Pulse 106  Temp(Src) 98.1 F (36.7 C) (Rectal)  Resp 16  SpO2 100% Physical Exam  Nursing note and vitals reviewed. Constitutional: She appears well-developed and  well-nourished. No distress.  HENT:  Head: Normocephalic and atraumatic.  Mouth/Throat: Oropharynx is clear and moist.  Eyes: Conjunctivae are normal. Pupils are equal, round, and reactive to light. No scleral icterus.  Neck: Neck supple.  Cardiovascular: Normal rate, regular rhythm, normal heart sounds and intact distal pulses.   No murmur heard. Pulmonary/Chest: Effort normal and breath sounds normal. No stridor. No respiratory distress. She has no rales.  Abdominal: Soft. Bowel sounds are normal. She exhibits no distension. There is no tenderness.  Musculoskeletal: Normal range of motion.  Neurological: She is unresponsive. GCS eye subscore is 4. GCS verbal subscore is 4.  Eyes deviated to the right  Skin: Skin is warm and dry. No rash noted.  Psychiatric: She has a normal mood and affect. Her behavior is normal.    ED Course  Procedures (including critical care time) Labs Review Labs Reviewed  POCT I-STAT, CHEM 8 - Abnormal; Notable for the following:    Creatinine, Ser 1.60 (*)    Glucose, Bld 141 (*)    Hemoglobin 16.7 (*)    HCT 49.0 (*)    All other components within normal limits  POCT I-STAT TROPONIN I   Imaging Review Ct Head Wo Contrast  12/28/2012   CLINICAL DATA:  Seizures.  EXAM: CT HEAD WITHOUT CONTRAST  TECHNIQUE: Contiguous axial images were obtained from the base of the skull through the vertex without intravenous contrast.  COMPARISON:  CT head and MRI brain 10/31/2005  FINDINGS: Mild cerebral atrophy. No ventricular dilatation. Patchy low-attenuation changes in the deep white matter consistent with small vessel ischemia. No mass effect or midline shift. No abnormal extra-axial fluid collections. Gray-white matter junctions are distinct. Basal cisterns are not effaced. No evidence of acute intracranial hemorrhage. No depressed skull fractures. Opacification of the left sphenoid sinus, similar to previous study. Vascular calcifications. No significant changes since  prior study.  IMPRESSION: No acute intracranial abnormalities. Mild chronic atrophy and small vessel ischemic change. Opacification of sphenoid sinus.   Electronically Signed   By: Burman Nieves M.D.   On: 12/28/2012 03:17   Dg Chest Port 1 View  12/28/2012   CLINICAL DATA:  Seizures.  EXAM: PORTABLE CHEST - 1 VIEW  COMPARISON:  04/16/2012  FINDINGS: Mild cardiac enlargement with normal pulmonary vascularity. Linear atelectasis in the lung bases. No focal consolidation. No blunting of costophrenic angles. No pneumothorax. Old right rib fracture.  IMPRESSION: Mild cardiac enlargement.  Atelectasis in the lung bases.   Electronically Signed   By: Burman Nieves M.D.   On: 12/28/2012 02:45  All radiology studies independently viewed by me.     EKG Interpretation   None       MDM   1. Generalized seizure   2. Hypertension    72 year old female presenting with seizure activity.  She does not have a history of seizures. Her boyfriend reports that she was acting abnormally prior to developing a seizure. He describes that she was not responding to her like she normally does, had generalized weakness, but was alert and had no focal neuro deficits.  He helped her to the car to bring her to the hospital.  En route, she began having a generalized tonic clonic seizure.  On my initial eval, she did not have clonic activity, but had eye deviation and was not responsive.  Given ativan which resolved seizure.  She slowly regained coherence over several hours.  Dr. Roseanne Reno (neurology) evaluated her and felt that she would benefit from admission and MRI/EEG.  On my re-eval, pt had become much more responsive, but still had delayed responses.  She also had right sided mouth twitching, which had not been present earlier.  This was thought to likely to be a focal seizure, so she was given ativan and loaded with keppra, with resolution of twitching.  Of note, her boyfriend said she had been having similar mouth  twitching off and on for over a month.  She will be admitted to the stepdown unit for further workup.    Candyce Churn, MD 12/28/12 [phone]

## 2012-12-28 NOTE — ED Notes (Signed)
Per family patient began seizing at home. Duration of seizure is undetermined. Patient does not have a history of seizure.

## 2012-12-28 NOTE — Progress Notes (Signed)
Seen by colleague Dr. Roseanne Reno this morning. Has continued to have right arm shaking and weakness throughout the day. MRI reveals right sided infarct.   She has woken up and is talking, but has some right sided weakness and intermittently has right arm shaking. She states taht she feels it is doing somewhat better though.   With borderline creatinine, would be hesitant to continue to increase keppra(got 1500mg  this morning) and therefore will add dilantin with load.   1) Dilantin load 20 mg/kg x 1 2) Increase keppra to 1000mg  BID(given that she received 1000mg  this am) 3) Will continue to follow.

## 2012-12-28 NOTE — ED Notes (Signed)
Neurologist at bedside. 

## 2012-12-28 NOTE — ED Notes (Signed)
Please call Mickie Bail with any changes [phone]

## 2012-12-28 NOTE — Progress Notes (Signed)
EEG Completed; Results Pending  

## 2012-12-28 NOTE — Procedures (Signed)
History: 72 year old female who presented with seizures.  Background: The background consists of mild generalized irregular slow activity with more normal activity superimposed. P3, O1 and at times P7 demonstrated persistent polymorphic delta throughout the recording. There is one run of sharply contoured activity is brief occurring in the same distribution. Though no clear allusion was seen, this does have a start and a stop.   Photic stimulation: Physiologic driving is not performed  EEG Abnormalities: 1) irregular left parietal-occipital delta activity 2) Left parieto-occipital sharp waves  Clinical Interpretation: This EEG is consistent with a potential area for epileptogenicity in the left parietal occipital region as well as dysfunction of this area. There is a brief run of sharply contoured activity which could represent a breif seizure, though that is not definite by this recording as there was no clear evolution.   Ritta Slot, MD [facility] [phone]  If 7pm- 7am, please page neurology on call at [phone].

## 2012-12-28 NOTE — Progress Notes (Signed)
Requested by ED for family support. Upon arrival, learned that patient was driven to ED by her partner, [name], and had likely been having seizures. Patient's friend, [name], was sitting in trauma hallway. He spoke very fast and fidgeted a lot, seemingly anxious. Charlie left when patient's daughter arrived. Chaplain escorted daughter to consultation room B and then to trauma bay A to be with her mother. She asked chaplain to pray and expressed that it was difficult to see her mother in hospital. Chaplain provided emotional and spiritual care, crisis support, prayer, acted as Print production planner, and provided nourishment. Followed up with daughter and friend several times during night and early morning. Both expressed thanks for chaplain support.   Guy Sandifer [place], [place] [phone]

## 2012-12-29 ENCOUNTER — Inpatient Hospital Stay (HOSPITAL_COMMUNITY): Payer: Medicare Other

## 2012-12-29 DIAGNOSIS — N179 Acute kidney failure, unspecified: Secondary | ICD-10-CM

## 2012-12-29 DIAGNOSIS — E872 Acidosis: Secondary | ICD-10-CM

## 2012-12-29 DIAGNOSIS — I059 Rheumatic mitral valve disease, unspecified: Secondary | ICD-10-CM

## 2012-12-29 DIAGNOSIS — R569 Unspecified convulsions: Secondary | ICD-10-CM

## 2012-12-29 DIAGNOSIS — G9340 Encephalopathy, unspecified: Secondary | ICD-10-CM

## 2012-12-29 LAB — COMPREHENSIVE METABOLIC PANEL
ALT: 11 U/L (ref 0–35)
AST: 20 U/L (ref 0–37)
Albumin: 3.2 g/dL — ABNORMAL LOW (ref 3.5–5.2)
Alkaline Phosphatase: 72 U/L (ref 39–117)
BUN: 15 mg/dL (ref 6–23)
CO2: 18 mEq/L — ABNORMAL LOW (ref 19–32)
Calcium: 8.6 mg/dL (ref 8.4–10.5)
Chloride: 102 mEq/L (ref 96–112)
Creatinine, Ser: 1.11 mg/dL — ABNORMAL HIGH (ref 0.50–1.10)
GFR calc Af Amer: 57 mL/min — ABNORMAL LOW (ref >90)
GFR calc non Af Amer: 49 mL/min — ABNORMAL LOW (ref >90)
Glucose, Bld: 106 mg/dL — ABNORMAL HIGH (ref 70–99)
Potassium: 4.6 mEq/L (ref 3.5–5.1)
Sodium: 135 mEq/L (ref 135–145)
Total Bilirubin: 0.7 mg/dL (ref 0.3–1.2)
Total Protein: 6.9 g/dL (ref 6.0–8.3)

## 2012-12-29 LAB — LIPASE, BLOOD: Lipase: 18 U/L (ref 11–59)

## 2012-12-29 LAB — CBC
HCT: 39.4 % (ref 36.0–46.0)
Hemoglobin: 13.3 g/dL (ref 12.0–15.0)
MCH: 32.7 pg (ref 26.0–34.0)
MCHC: 33.8 g/dL (ref 30.0–36.0)
MCV: 96.8 fL (ref 78.0–100.0)
Platelets: 225 10*3/uL (ref 150–400)
RBC: 4.07 MIL/uL (ref 3.87–5.11)
RDW: 16.1 % — ABNORMAL HIGH (ref 11.5–15.5)
WBC: 7.6 10*3/uL (ref 4.0–10.5)

## 2012-12-29 LAB — TROPONIN I: Troponin I: <0.3 ng/mL (ref <0.30)

## 2012-12-29 LAB — HEMOGLOBIN A1C
Hgb A1c MFr Bld: 5.2 % (ref <5.7)
Mean Plasma Glucose: 103 mg/dL (ref <117)

## 2012-12-29 LAB — LACTIC ACID, PLASMA: Lactic Acid, Venous: 1.4 mmol/L (ref 0.5–2.2)

## 2012-12-29 LAB — PHENYTOIN LEVEL, TOTAL: Phenytoin Lvl: 20.5 ug/mL — ABNORMAL HIGH (ref 10.0–20.0)

## 2012-12-29 LAB — TSH: TSH: 0.837 u[IU]/mL (ref 0.350–4.500)

## 2012-12-29 MED ORDER — PANTOPRAZOLE SODIUM 40 MG IV SOLR
40.0000 mg | Freq: Two times a day (BID) | INTRAVENOUS | Status: DC
  Administered 2012-12-29 – 2013-01-04 (×13): 40 mg via INTRAVENOUS
  Filled 2012-12-29 (×14): qty 40

## 2012-12-29 MED ORDER — SODIUM CHLORIDE 0.9 % IV SOLN
100.0000 mg | Freq: Two times a day (BID) | INTRAVENOUS | Status: DC
  Administered 2012-12-29: 100 mg via INTRAVENOUS
  Filled 2012-12-29 (×2): qty 10

## 2012-12-29 MED ORDER — SODIUM CHLORIDE 0.9 % IV SOLN
100.0000 mg | Freq: Two times a day (BID) | INTRAVENOUS | Status: DC
  Administered 2012-12-30 – 2012-12-31 (×3): 100 mg via INTRAVENOUS
  Filled 2012-12-29 (×5): qty 10

## 2012-12-29 NOTE — Progress Notes (Signed)
  Echocardiogram 2D Echocardiogram has been performed.  [name] 12/29/2012, 12:06 PM

## 2012-12-29 NOTE — Progress Notes (Signed)
Pt continues to c/o of abd pain, dr Sunnie Nielsen notified.

## 2012-12-29 NOTE — Progress Notes (Signed)
VASCULAR LAB PRELIMINARY  PRELIMINARY  PRELIMINARY  PRELIMINARY  Carotid Dopplers completed.    Preliminary report:  1-39% ICA stenosis.  Vertebral artery flow is antegrade.  Kaladin Noseworthy, RVT 12/29/2012, 10:14 AM

## 2012-12-29 NOTE — Progress Notes (Signed)
Subjective: More awake, thoughh complaining of abdominal pain and back pain.  Exam: Filed Vitals:   12/29/12 0700  BP: 165/103  Pulse: 111  Temp:   Resp: 20   Gen: In bed, NAD MS: Awake, alert, able to identify her companion, she is able to come in that she is at [facility] cone. 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 She continues to have intermittent seizures affecting the right arm and leg.   Recommendations: 1) continue Keppra 1000 g twice a day 2) continue Dilantin 100 mg 3 times a day 3) check Dilantin level today, if therapeutic then will add third AED 4) asa for stroke 5) carotid dopplers, echo, LDL 6) will need therapy evals once seizures better controlled.    Ritta Slot, MD [facility] [phone]  If 7pm- 7am, please page neurology on call at [phone].

## 2012-12-29 NOTE — Progress Notes (Signed)
[facility] PROGRESS NOTE  [name] ZOX:[id number] DOB: 1940-11-02 DOA: 12/28/2012 PCP: Default, Provider, MD  Assessment/Plan: 1-Seizure Disorder: In setting of stroke and chronic hemorrhage. Started on Kepra and dilantin. EEG: irregular left parietal-occipital delta activity, Left parieto-occipital sharp waves.   2-Acute 1 cm right basal ganglia infarct; stroke work up in process. Continue with aspirin. Neuro following. Need PT, OT.   3-Hypertension: PRN hydralazine for SBP more than 190, diastolic more than 100.  4-Metabolic acidosis. Lactic acid was normal. Continue with IV fluids. Improved.  5-Abdominal Pain: epigastric tenderness. Start IV protonix. Check KUB. Check lipase and repeat lactic acid. Need to be cautious with opioids to avoid oversedation.  6-Acute renal insufficiency: cr on admission at 1.6. Will continue with IV fluids. Cr has decreases 1.1. 7-Encephalopathy/AMS; in setting of stroke and seizures.  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  9-Sinus tachycardia; HR 100. Monitor. Check TSH.    Code Status: full code.  Family Communication: care discussed with daughter who was at bedside.  Disposition Plan: remain in the step down.    Consultants:  Neurology.   Procedures:  ECHO;  Doppler:    Antibiotics:  none  HPI/Subjective: Patient wake up to answer question. She is complaining of abdominal pain, 10/10. She has had this abdominal pain before not this severe.   Objective: Filed Vitals:   12/29/12 0700  BP: 165/103  Pulse: 111  Temp:   Resp: 20    Intake/Output Summary (Last 24 hours) at 12/29/12 0821 Last data filed at 12/29/12 0600  Gross per 24 hour  Intake   2531 ml  Output    200 ml  Net   2331 ml   Filed Weights   12/28/12 1915 12/29/12 0500  Weight: 40 kg (88 lb 2.9 oz) 40.7 kg (89 lb 11.6 oz)    Exam:   General:  Patient wake up to  answer question. No acute distress.   Cardiovascular: S 1, S 2 RRR  Respiratory: CTA  Abdomen: BS present, soft, epigastric tenderness. NR  Musculoskeletal: SCD, no edema.   Neuro: not oriented to place, recognized daughter. Right side hemiparesis. Able to move left side.   Data Reviewed: Basic Metabolic Panel:  Recent Labs Lab 12/28/12 0155 12/28/12 0217 12/28/12 0956 12/28/12 1953 12/29/12 0430  NA 139 136 133* 134* 135  K 3.6 3.5 5.4* 4.7 4.6  CL 102 94* 98 100 102  CO2  --  15* 20 18* 18*  GLUCOSE 141* 140* 83 95 106*  BUN 23 23 22 17 15   CREATININE 1.60* 1.37*  1.40* 1.16* 1.14* 1.11*  CALCIUM  --  10.0 9.3 8.9 8.6   Liver Function Tests:  Recent Labs Lab 12/28/12 0217 12/29/12 0430  AST 28 20  ALT 18 11  ALKPHOS 95 72  BILITOT 0.5 0.7  PROT 8.4* 6.9  ALBUMIN 4.3 3.2*   No results found for this basename: LIPASE, AMYLASE,  in the last 168 hours No results found for this basename: AMMONIA,  in the last 168 hours CBC:  Recent Labs Lab 12/28/12 0155 12/28/12 0217 12/29/12 0430  WBC  --  8.9  10.4 7.6  NEUTROABS  --  4.4  --   HGB 16.7* 13.5  14.4 13.3  HCT 49.0* 40.7  43.2 39.4  MCV  --  99.5  101.4* 96.8  PLT  --  284  281 225   Cardiac Enzymes:  Recent Labs Lab 12/29/12 0430  TROPONINI <0.30   BNP (last 3 results) No results found for this basename: PROBNP,  in the last 8760 hours CBG: No results found for this basename: GLUCAP,  in the last 168 hours  Recent Results (from the past 240 hour(s))  MRSA PCR SCREENING     Status: None   Collection Time    12/28/12  1:43 PM      Result Value Range Status   MRSA by PCR NEGATIVE  NEGATIVE Final   Comment:            The GeneXpert MRSA Assay (FDA     approved for NASAL specimens     only), is one component of a     comprehensive MRSA colonization     surveillance program. It is not     intended to diagnose MRSA     infection nor to guide or     monitor treatment for     MRSA  infections.     Studies: Ct Head Wo Contrast  12/28/2012   CLINICAL DATA:  Seizures.  EXAM: CT HEAD WITHOUT CONTRAST  TECHNIQUE: Contiguous axial images were obtained from the base of the skull through the vertex without intravenous contrast.  COMPARISON:  CT head and MRI brain 10/31/2005  FINDINGS: Mild cerebral atrophy. No ventricular dilatation. Patchy low-attenuation changes in the deep white matter consistent with small vessel ischemia. No mass effect or midline shift. No abnormal extra-axial fluid collections. Gray-white matter junctions are distinct. Basal cisterns are not effaced. No evidence of acute intracranial hemorrhage. No depressed skull fractures. Opacification of the left sphenoid sinus, similar to previous study. Vascular calcifications. No significant changes since prior study.  IMPRESSION: No acute intracranial abnormalities. Mild chronic atrophy and small vessel ischemic change. Opacification of sphenoid sinus.   Electronically Signed   By: Burman Nieves M.D.   On: 12/28/2012 03:17   Mr Brain Wo Contrast  12/28/2012   CLINICAL DATA:  Seizure.  EXAM: MRI HEAD WITHOUT CONTRAST  TECHNIQUE: Multiplanar, multiecho pulse sequences of the brain and surrounding structures were obtained without intravenous contrast.  COMPARISON:  Head CT 12/28/2012 and brain MRI 10/31/2005  FINDINGS: Images are moderately degraded by motion artifact. There is a 1.1 cm focus of restricted diffusion involving the right basal ganglia, consistent with acute infarct. There is no other evidence of acute infarct. Patchy T2 hyperintensities within the subcortical and deep cerebral white matter have increased from the prior exam and are compatible with moderate chronic small vessel ischemic disease. Focus of susceptibility artifact in the posterior left cingulate gyrus/left splenium of the corpus callosum appears new from the prior study and is compatible with interval, nonacute hemorrhage. Dedicated thin section  imaging of the medial temporal lobes is limited by motion artifact without gross the hippocampal abnormality identified. There is no evidence of mass, midline shift, or extra-axial fluid collection. Major intracranial flow voids are unremarkable. Prior left cataract surgery is noted. Visualized paranasal sinuses are clear.  IMPRESSION: 1. 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 2. Moderate chronic small vessel ischemic disease, increased from prior. 3. Interval, chronic hemorrhage involving the posterior left cingulate gyrus/left splenium of the corpus callosum.   Electronically Signed   By: Sebastian Ache   On: 12/28/2012 10:23   Dg Chest Port 1 View  12/28/2012   CLINICAL DATA:  Seizures.  EXAM: PORTABLE CHEST - 1 VIEW  COMPARISON:  04/16/2012  FINDINGS: Mild cardiac enlargement with normal pulmonary vascularity. Linear atelectasis in the lung bases. No  focal consolidation. No blunting of costophrenic angles. No pneumothorax. Old right rib fracture.  IMPRESSION: Mild cardiac enlargement.  Atelectasis in the lung bases.   Electronically Signed   By: Burman Nieves M.D.   On: 12/28/2012 02:45    Scheduled Meds: . antiseptic oral rinse  15 mL Mouth Rinse Q4H  . aspirin  300 mg Rectal Daily  . enoxaparin (LOVENOX) injection  30 mg Subcutaneous Q24H  . levETIRAcetam  1,000 mg Intravenous Q12H  . phenytoin (DILANTIN) IV  100 mg Intravenous Q8H   Continuous Infusions: . sodium chloride 125 mL/hr at 12/29/12 0030    Active Problems:   Generalized seizure   Hypertension   Seizure   Metabolic acidosis   Encephalopathy   Acute renal failure    Time spent: 35 minutes.     REGALADO,BELKYS  [facility] Pager [phone]. If 7PM-7AM, please contact night-coverage at [email or web address], password [facility] 12/29/2012, 8:21 AM  LOS: 1 day

## 2012-12-29 NOTE — Progress Notes (Signed)
Pt c/o abdl and back pain notified M lynch.

## 2012-12-30 LAB — BASIC METABOLIC PANEL
BUN: 17 mg/dL (ref 6–23)
CO2: 17 mEq/L — ABNORMAL LOW (ref 19–32)
Calcium: 8.8 mg/dL (ref 8.4–10.5)
Chloride: 103 mEq/L (ref 96–112)
Creatinine, Ser: 1.34 mg/dL — ABNORMAL HIGH (ref 0.50–1.10)
GFR calc Af Amer: 45 mL/min — ABNORMAL LOW (ref >90)
GFR calc non Af Amer: 39 mL/min — ABNORMAL LOW (ref >90)
Glucose, Bld: 72 mg/dL (ref 70–99)
Potassium: 4.3 mEq/L (ref 3.5–5.1)
Sodium: 136 mEq/L (ref 135–145)

## 2012-12-30 LAB — LIPID PANEL
Cholesterol: 141 mg/dL (ref 0–200)
HDL: 75 mg/dL (ref >39)
LDL Cholesterol: 40 mg/dL (ref 0–99)
Total CHOL/HDL Ratio: 1.9 RATIO
Triglycerides: 128 mg/dL (ref <150)
VLDL: 26 mg/dL (ref 0–40)

## 2012-12-30 LAB — CBC
HCT: 35.6 % — ABNORMAL LOW (ref 36.0–46.0)
Hemoglobin: 11.9 g/dL — ABNORMAL LOW (ref 12.0–15.0)
MCH: 32.3 pg (ref 26.0–34.0)
MCHC: 33.4 g/dL (ref 30.0–36.0)
MCV: 96.7 fL (ref 78.0–100.0)
Platelets: 216 10*3/uL (ref 150–400)
RBC: 3.68 MIL/uL — ABNORMAL LOW (ref 3.87–5.11)
RDW: 16.4 % — ABNORMAL HIGH (ref 11.5–15.5)
WBC: 5.9 10*3/uL (ref 4.0–10.5)

## 2012-12-30 MED ORDER — CARVEDILOL 3.125 MG PO TABS
3.1250 mg | ORAL_TABLET | Freq: Two times a day (BID) | ORAL | Status: DC
  Administered 2012-12-30 – 2013-01-04 (×10): 3.125 mg via ORAL
  Filled 2012-12-30 (×14): qty 1

## 2012-12-30 MED ORDER — ASPIRIN EC 325 MG PO TBEC
325.0000 mg | DELAYED_RELEASE_TABLET | Freq: Every day | ORAL | Status: DC
  Administered 2012-12-30 – 2013-01-04 (×6): 325 mg via ORAL
  Filled 2012-12-30 (×6): qty 1

## 2012-12-30 MED ORDER — SODIUM BICARBONATE 650 MG PO TABS
650.0000 mg | ORAL_TABLET | Freq: Two times a day (BID) | ORAL | Status: DC
  Administered 2012-12-30 – 2012-12-31 (×3): 650 mg via ORAL
  Filled 2012-12-30 (×5): qty 1

## 2012-12-30 NOTE — Progress Notes (Signed)
12/30/2012 transfer from 2 heart to 6east at 1100. She is alert, oriented to person, place unsure of time. Patient is weak on right side because of CVA during this admission. Patient skin is dry, but nom breakdown noted. Patient is weak because of stroke  And she is 2 person assist from bed to chair. Patient was place on telemetry and was place on bed alarm. [employer] systems engineer.

## 2012-12-30 NOTE — Progress Notes (Signed)
Subjective: No further seizures  Exam: Filed Vitals:   12/30/12 0800  BP: 159/90  Pulse: 108  Temp: 98.5 F (36.9 C)  Resp: 16   Gen: In bed, NAD MS: Awake, Alert, oriented to [place], btu gets year wrong.  WU:JWJXB, VFF, EOMI Motor: 4/5 on the right, full strength on the left.  Sensory:intact to temp   Impression: 72 yo F presenting with status epilepticus with a likely focus of chronic hemorrhage in the left temporal region. Echo with mildly low EF, but I do not think that this was a source. Dopplers are negative. LDL 40, ok. A1C.  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  Recommendations: 1) continue current AEDs 2) continue ASA  3) Will continue to follow, likely will need PT   Ritta Slot, MD [facility] [phone]  If 7pm- 7am, please page neurology on call at [phone].

## 2012-12-30 NOTE — Progress Notes (Signed)
[facility] PROGRESS NOTE  [name] ZOX:[id number] DOB: Nov 29, 1940 DOA: 12/28/2012 PCP: Default, Provider, MD  Assessment/Plan: 1-Seizure Disorder: In setting of stroke and chronic hemorrhage. Continue with  Kepra and dilantin. EEG: irregular left parietal-occipital delta activity, Left parieto-occipital sharp waves. Patient alert, following command.   2-Acute 1 cm right basal ganglia infarct; ECHO with Ef 35 to 40 %. No need for TEE per neurology recommendation. Carotid doppler: Preliminary report: 1-39% ICA stenosis. Vertebral artery flow is antegrade. LDL 40, Hb A-1c; 5.2 -Continue with aspirin.  -PT, OT , speech consulted.   3-Hypertension: PRN hydralazine for SBP more than 190, diastolic more than 100.   4-Metabolic acidosis. Lactic acid was normal. Start sodium bicarb. Repeat B-met in am.    5-Abdominal Pain: resolved.  Continue with protonix. KUB with Mildly prominent loops of colon in the right lower quadrant,  possibly reflecting adynamic colonic ileus. Pain has resolved. Monitor clinically. Start diet if pass swallow evaluation.  Lipase normal. Lactic acid normal.   6-Acute renal insufficiency: cr on admission at 1.6. Decrease IV fluids due to cardiomyopathy. Cr has decrease to 1.3.   7-Encephalopathy/AMS; in setting of stroke and seizures.   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   9-Sinus tachycardia; HR 100. Monitor.  TSH at 0.83. Start coreg due to cardiomyopathy.   10-Cardiomyopathy EF 35 to 40%, last Ef per Cath 2010 was normal at 55 %. I will start coreg. I will start ACE when renal function allows it. Will discussed results with primary cardiologist Dr Eldridge Dace.   Code Status: full code.  Family Communication: care discussed with patient.  Disposition Plan: Transfer to telemetry.    Consultants:  Neurology.   Procedures:  ECHO;Left ventricle: The cavity size was  normal. Systolic function was moderately reduced. The estimated ejection fraction was in the range of 35% to 40%. Diffuse hypokinesis. - Mitral valve: Mild regurgitation. - Pulmonary arteries: PA peak pressure: 48mm Hg (S).   Doppler: Preliminary report: 1-39% ICA stenosis. Vertebral artery flow is antegrade.   Antibiotics:  none  HPI/Subjective: Patient alert this morning. Oriented to place and person. She was having generalized pain yesterday. Denies pain today.  She denies abdominal pain today. She is passing gas.   Objective: Filed Vitals:   12/30/12 0600  BP: 142/80  Pulse: 95  Temp:   Resp: 17    Intake/Output Summary (Last 24 hours) at 12/30/12 0716 Last data filed at 12/30/12 0400  Gross per 24 hour  Intake 1723.75 ml  Output      0 ml  Net 1723.75 ml   Filed Weights   12/28/12 1915 12/29/12 0500 12/30/12 0400  Weight: 40 kg (88 lb 2.9 oz) 40.7 kg (89 lb 11.6 oz) 42.9 kg (94 lb 9.2 oz)    Exam:   General:  Patient alert, answering questions.   Cardiovascular: S 1, S 2 RRR  Respiratory: CTA  Abdomen: BS present, soft, no tenderness, no rigidity.   Musculoskeletal: SCD, no edema.   Neuro: right side hemiparesis improving. Alert and oriented.   Data Reviewed: Basic Metabolic Panel:  Recent Labs Lab 12/28/12 0155 12/28/12 0217 12/28/12 0956 12/28/12 1953 12/29/12 0430  NA 139 136 133* 134* 135  K 3.6 3.5 5.4* 4.7 4.6  CL 102 94* 98 100 102  CO2  --  15* 20 18* 18*  GLUCOSE 141* 140* 83 95 106*  BUN 23 23 22 17 15   CREATININE 1.60* 1.37*  1.40* 1.16* 1.14* 1.11*  CALCIUM  --  10.0 9.3 8.9 8.6   Liver Function Tests:  Recent Labs Lab 12/28/12 0217 12/29/12 0430  AST 28 20  ALT 18 11  ALKPHOS 95 72  BILITOT 0.5 0.7  PROT 8.4* 6.9  ALBUMIN 4.3 3.2*    Recent Labs Lab 12/29/12 0900  LIPASE 18   No results found for this basename: AMMONIA,  in the last 168 hours CBC:  Recent Labs Lab 12/28/12 0155 12/28/12 0217  12/29/12 0430 12/30/12 0530  WBC  --  8.9  10.4 7.6 5.9  NEUTROABS  --  4.4  --   --   HGB 16.7* 13.5  14.4 13.3 11.9*  HCT 49.0* 40.7  43.2 39.4 35.6*  MCV  --  99.5  101.4* 96.8 96.7  PLT  --  284  281 225 216   Cardiac Enzymes:  Recent Labs Lab 12/29/12 0430  TROPONINI <0.30   BNP (last 3 results) No results found for this basename: PROBNP,  in the last 8760 hours CBG: No results found for this basename: GLUCAP,  in the last 168 hours  Recent Results (from the past 240 hour(s))  MRSA PCR SCREENING     Status: None   Collection Time    12/28/12  1:43 PM      Result Value Range Status   MRSA by PCR NEGATIVE  NEGATIVE Final   Comment:            The GeneXpert MRSA Assay (FDA     approved for NASAL specimens     only), is one component of a     comprehensive MRSA colonization     surveillance program. It is not     intended to diagnose MRSA     infection nor to guide or     monitor treatment for     MRSA infections.     Studies: Mr Brain Wo Contrast  12/28/2012   CLINICAL DATA:  Seizure.  EXAM: MRI HEAD WITHOUT CONTRAST  TECHNIQUE: Multiplanar, multiecho pulse sequences of the brain and surrounding structures were obtained without intravenous contrast.  COMPARISON:  Head CT 12/28/2012 and brain MRI 10/31/2005  FINDINGS: Images are moderately degraded by motion artifact. There is a 1.1 cm focus of restricted diffusion involving the right basal ganglia, consistent with acute infarct. There is no other evidence of acute infarct. Patchy T2 hyperintensities within the subcortical and deep cerebral white matter have increased from the prior exam and are compatible with moderate chronic small vessel ischemic disease. Focus of susceptibility artifact in the posterior left cingulate gyrus/left splenium of the corpus callosum appears new from the prior study and is compatible with interval, nonacute hemorrhage. Dedicated thin section imaging of the medial temporal lobes is  limited by motion artifact without gross the hippocampal abnormality identified. There is no evidence of mass, midline shift, or extra-axial fluid collection. Major intracranial flow voids are unremarkable. Prior left cataract surgery is noted. Visualized paranasal sinuses are clear.  IMPRESSION: 1. 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 2. Moderate chronic small vessel ischemic disease, increased from prior. 3. Interval, chronic hemorrhage involving the posterior left cingulate gyrus/left splenium of the corpus callosum.   Electronically Signed   By: Sebastian Ache   On: 12/28/2012 10:23   Dg Abd Portable 1v  12/29/2012   CLINICAL DATA:  Abdominal pain, epigastric tenderness  EXAM: PORTABLE ABDOMEN - 1 VIEW  COMPARISON:  CT abdomen pelvis dated 04/16/2012  FINDINGS: Mildly prominent  loops of gas and stool fluid colon in the right lower abdomen, possibly reflecting adynamic colonic ileus, but nonobstructive.  Mild lumbar levoscoliosis.  IMPRESSION: Mildly prominent loops of colon in the right lower quadrant, possibly reflecting adynamic colonic ileus.  No evidence of bowel obstruction.   Electronically Signed   By: Charline Bills M.D.   On: 12/29/2012 15:39    Scheduled Meds: . antiseptic oral rinse  15 mL Mouth Rinse Q4H  . aspirin  300 mg Rectal Daily  . enoxaparin (LOVENOX) injection  30 mg Subcutaneous Q24H  . lacosamide (VIMPAT) IV  100 mg Intravenous Q12H  . levETIRAcetam  1,000 mg Intravenous Q12H  . pantoprazole (PROTONIX) IV  40 mg Intravenous Q12H  . phenytoin (DILANTIN) IV  100 mg Intravenous Q8H   Continuous Infusions: . sodium chloride 50 mL/hr at 12/29/12 1800    Active Problems:   Generalized seizure   Hypertension   Seizure   Metabolic acidosis   Encephalopathy   Acute renal failure    Time spent: 35 minutes.     [name]  [facility] Pager [phone]. If 7PM-7AM, please contact night-coverage at [email or web address], password [facility] 12/30/2012, 7:16 AM  LOS:  2 days

## 2012-12-30 NOTE — Evaluation (Signed)
Physical Therapy Evaluation Patient Details Name: [name] MRN: [id number] DOB: 24-Sep-1940 Today's Date: 12/30/2012 Time: 3086-5784 PT Time Calculation (min): 25 min  PT Assessment / Plan / Recommendation History of Present Illness  72 yo F presenting with status epilepticus with a likely focus of chronic hemorrhage in the left temporal region and Right basal ganglia infarct  Clinical Impression  Pt pleasant but confused with right hemiparesis, impaired balance and safety awareness. Pt was previously independent and living alone but family states they could provide 24hr assist as needed. Will follow acutely to maximize strength, balance, function, safety and gait to decrease burden of care. Recommend OOB daily with nursing with RLE blocked for safety. End of session pt drifting to sleep and required cueing to stay awake in order to eat. Pt incontinent on arrival and assist of RN for pericare in standing. Pt with decreased light touch right side and decreased proprioception.     PT Assessment  Patient needs continued PT services    Follow Up Recommendations  CIR;Supervision/Assistance - 24 hour    Does the patient have the potential to tolerate intense rehabilitation      Barriers to Discharge        Equipment Recommendations  Other (comment) (to be determined )    Recommendations for Other Services OT consult;Rehab consult   Frequency Min 4X/week    Precautions / Restrictions Precautions Precautions: Fall Precaution Comments: right hemiparesis   Pertinent Vitals/Pain No pain     Mobility  Bed Mobility Bed Mobility: Sitting - Scoot to Edge of Bed;Rolling Left;Left Sidelying to Sit Rolling Left: 3: Mod assist;With rail Left Sidelying to Sit: 3: Mod assist;HOB elevated;With rails Sitting - Scoot to Edge of Bed: 3: Mod assist Details for Bed Mobility Assistance: cueing with hand over hand assist to use RUE to reach for rail, assist to elevate trunk from surface and use of  pad for reciprocal scooting to edge of bed. Pt with left lean EOB with assist to maintain balance Transfers Transfers: Sit to Stand;Stand to Sit;Stand Pivot Transfers Sit to Stand: 3: Mod assist;From bed;From chair/3-in-1 Stand to Sit: 3: Mod assist;To chair/3-in-1 Stand Pivot Transfers: 3: Mod assist Details for Transfer Assistance: RLE blocked throughout with cueing for hand placement, anterior translation and safety with transfer as pt unable to weight shift to right and only limited advancement of RLE. Stood a second time from chair and attempted standing marching but pt unable. Mod assist to scoot back with assist of pad Ambulation/Gait Ambulation/Gait Assistance: Not tested (comment) Modified Rankin (Stroke Patients Only) Pre-Morbid Rankin Score: No symptoms Modified Rankin: Severe disability    Exercises     PT Diagnosis: Abnormality of gait;Hemiplegia non-dominant side;Altered mental status  PT Problem List: Decreased strength;Decreased cognition;Decreased knowledge of use of DME;Decreased activity tolerance;Decreased safety awareness;Decreased balance;Decreased mobility;Decreased coordination PT Treatment Interventions: Gait training;DME instruction;Balance training;Neuromuscular re-education;Cognitive remediation;Functional mobility training;Patient/family education;Therapeutic activities;Therapeutic exercise     PT Goals(Current goals can be found in the care plan section) Acute Rehab PT Goals Patient Stated Goal: return home PT Goal Formulation: With patient/family Time For Goal Achievement: 01/13/13 Potential to Achieve Goals: Fair  Visit Information  Last PT Received On: 12/30/12 Assistance Needed: +1 History of Present Illness: 71 yo F presenting with status epilepticus with a likely focus of chronic hemorrhage in the left temporal region and Right basal ganglia infarct       Prior Functioning  Home Living Family/patient expects to be discharged to:: Private  residence Living Arrangements: Alone Available  Help at Discharge: Family;Available 24 hours/day Type of Home: House Home Layout: One level Home Equipment: None Prior Function Level of Independence: Independent Communication Communication: No difficulties Dominant Hand: Left    Cognition  Cognition Arousal/Alertness: Awake/alert Behavior During Therapy: Flat affect Overall Cognitive Status: Impaired/Different from baseline Area of Impairment: Memory;Orientation;Following commands;Problem solving;Safety/judgement Orientation Level: Time Memory: Decreased short-term memory;Decreased recall of precautions Following Commands: Follows one step commands with increased time;Follows one step commands consistently Safety/Judgement: Decreased awareness of deficits Problem Solving: Slow processing;Decreased initiation;Difficulty sequencing;Requires verbal cues;Requires tactile cues General Comments: Pt with difficulty recalling family members in room, Pt unaware of incontinent bladder on arrival, pt unaware of right sided deficits and even after mobility stating she would be able to return home alone    Extremity/Trunk Assessment Upper Extremity Assessment Upper Extremity Assessment: Defer to OT evaluation Lower Extremity Assessment Lower Extremity Assessment: RLE deficits/detail;LLE deficits/detail RLE Deficits / Details: pt with 2/5 strength hip flexion, knee extension and dorsiflexion RLE Sensation: decreased light touch RLE Coordination: decreased gross motor;decreased fine motor LLE Deficits / Details: 3+/5 hip flexion, knee extension and dorsiflexion Cervical / Trunk Assessment Cervical / Trunk Assessment: Normal   Balance Balance Balance Assessed: Yes Static Sitting Balance Static Sitting - Balance Support: Bilateral upper extremity supported;Feet supported Static Sitting - Level of Assistance: 4: Min assist Static Sitting - Comment/# of Minutes: 3 Static Standing Balance Static  Standing - Balance Support: Bilateral upper extremity supported Static Standing - Level of Assistance: 4: Min assist Static Standing - Comment/# of Minutes: 1  End of Session PT - End of Session Activity Tolerance: Patient tolerated treatment well Patient left: in chair;with call bell/phone within reach;with nursing/sitter in room;with family/visitor present Nurse Communication: Mobility status;Precautions  GP     Toney Sang Beth 12/30/2012, 1:32 PM Delaney Meigs, PT [phone]

## 2012-12-30 NOTE — Evaluation (Signed)
Clinical/Bedside Swallow Evaluation Patient Details  Name: [name] MRN: [id number] Date of Birth: [birth year]  Today's Date: 12/30/2012 Time: 4098-1191 SLP Time Calculation (min): 16 min  Past Medical History:  Past Medical History  Diagnosis Date  . MI (myocardial infarction)   . Hypertension    Past Surgical History: History reviewed. No pertinent past surgical history. HPI:  72 yo female adm to [facility] with ? seizure, AMS. 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  BSE ordered.    Assessment / Plan / Recommendation Clinical Impression  Pt presents with functional oropharyngeal swallow ability based on clinical evaluation.  No s/s of aspiration with all po observed, peaches, graham cracker and water.  Pt without focal CN deficits and denies dysphagia hx.  SLP educated pt to aspiration precautions and clinical indications of aspiration/dysphagia that would indicate need for further testing.   Rec regular/ chopped meats due to right arm weakness/thin.  All education completed,SLP to sign off.      Aspiration Risk  Mild    Diet Recommendation Regular;Thin liquid   Liquid Administration via: Cup;Straw Medication Administration: Whole meds with liquid Supervision: Patient able to self feed Compensations: Slow rate;Small sips/bites Postural Changes and/or Swallow Maneuvers: Seated upright 90 degrees    Other  Recommendations Oral Care Recommendations: Oral care BID   Follow Up Recommendations       Frequency and Duration        Pertinent Vitals/Pain Afebrile, decreased    SLP Swallow Goals     Swallow Study Prior Functional Status   see hhx    General Date of Onset: 12/30/12 HPI: 72 yo female adm to [facility] with ? seizure, AMS. 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  BSE ordered.  Type of Study: Bedside swallow evaluation Diet Prior to this Study: NPO Temperature Spikes Noted: No Respiratory Status: Room air Behavior/Cognition: Alert;Cooperative;Pleasant mood Oral Cavity -  Dentition: Dentures, top;Adequate natural dentition Self-Feeding Abilities: Able to feed self (pt is left handed) Patient Positioning: Upright in bed Baseline Vocal Quality: Clear Volitional Cough: Strong Volitional Swallow: Able to elicit    Oral/Motor/Sensory Function Overall Oral Motor/Sensory Function: Appears within functional limits for tasks assessed   Ice Chips Ice chips: Not tested   Thin Liquid Thin Liquid: Within functional limits Presentation: Self Fed;Spoon    Nectar Thick Nectar Thick Liquid: Not tested   Honey Thick Honey Thick Liquid: Not tested   Puree Puree: Not tested   Solid   GO    Solid: Within functional limits Presentation: Self Fed;Spoon Other Comments: peaches, graham crackers       Donavan Burnet, MS [facility] SLP [phone]

## 2012-12-31 LAB — BASIC METABOLIC PANEL
BUN: 14 mg/dL (ref 6–23)
CO2: 22 mEq/L (ref 19–32)
Calcium: 9.5 mg/dL (ref 8.4–10.5)
Chloride: 98 mEq/L (ref 96–112)
Creatinine, Ser: 1.04 mg/dL (ref 0.50–1.10)
GFR calc Af Amer: 62 mL/min — ABNORMAL LOW (ref >90)
GFR calc non Af Amer: 53 mL/min — ABNORMAL LOW (ref >90)
Glucose, Bld: 109 mg/dL — ABNORMAL HIGH (ref 70–99)
Potassium: 3.9 mEq/L (ref 3.5–5.1)
Sodium: 137 mEq/L (ref 135–145)

## 2012-12-31 MED ORDER — SODIUM CHLORIDE 0.9 % IV SOLN
150.0000 mg | Freq: Two times a day (BID) | INTRAVENOUS | Status: DC
  Administered 2012-12-31 – 2013-01-01 (×2): 150 mg via INTRAVENOUS
  Filled 2012-12-31 (×3): qty 15

## 2012-12-31 NOTE — Progress Notes (Signed)
Physical Therapy Treatment Patient Details Name: [name] MRN: [id number] DOB: February 16, 1940 Today's Date: 12/31/2012 Time: 1005-1029 PT Time Calculation (min): 24 min  PT Assessment / Plan / Recommendation  History of Present Illness 72 yo F presenting with status epilepticus with a likely focus of chronic hemorrhage in the left temporal region and Right basal ganglia infarct   PT Comments   Pt able to attempt ambulating today. Required 2 person handheld (A) to maintain balance and (A) with facilitating weight shifting. Pt has difficulty WB through Rt LE with transfers and mobility; Rt LE tends to buckle throughout unless blocked. Pt much more alert today; seemed to perseverate on back pain. Family present and providing good caregiver support. Pt continues to be great candidate for CIR due to caregiver support and PLOF. Will cont to follow per POC.   Follow Up Recommendations  CIR;Supervision/Assistance - 24 hour     Does the patient have the potential to tolerate intense rehabilitation     Barriers to Discharge        Equipment Recommendations  Other (comment)    Recommendations for Other Services OT consult;Rehab consult  Frequency Min 4X/week   Progress towards PT Goals Progress towards PT goals: Progressing toward goals  Plan Current plan remains appropriate    Precautions / Restrictions Precautions Precautions: Fall Precaution Comments: right hemiparesis Restrictions Weight Bearing Restrictions: No   Pertinent Vitals/Pain C/o back pain; did not rate. Pt medicated with oral pain medicine by RN. patient repositioned for comfort in chair.      Mobility  Bed Mobility Bed Mobility: Not assessed Details for Bed Mobility Assistance: pt sitting EOB with OT Transfers Transfers: Sit to Stand;Stand to Sit;Stand Pivot Transfers Sit to Stand: 3: Mod assist;From bed;With upper extremity assist Stand to Sit: 3: Mod assist;To chair/3-in-1 Stand Pivot Transfers: 3: Mod  assist Details for Transfer Assistance: pt with increased difficulty achieving upright standing position secondary to Rt sided weakness; Rt LE blocked throughout transfers to prevent buckling; required (A) to maintain balance and maintain standing; pt tends to lean to Rt   Ambulation/Gait Ambulation/Gait Assistance: 1: +2 Total assist Ambulation/Gait: Patient Percentage: 60% Ambulation Distance (Feet): 4 Feet Assistive device: 2 person hand held assist Ambulation/Gait Assistance Details: pt able to advance LEs minimally but with Rt LE continuously buckling; pt with narrow BOS and requires 2 person (A) to maintain balance and (A) facilitating weightshift. pt returned to bed and then performed SPT to chair for safety Gait Pattern: Right flexed knee in stance;Narrow base of support;Trunk flexed;Shuffle;Decreased hip/knee flexion - right;Decreased stride length Gait velocity: very decreased  Stairs: No Wheelchair Mobility Wheelchair Mobility: No Modified Rankin (Stroke Patients Only) Pre-Morbid Rankin Score: No symptoms Modified Rankin: Severe disability    Exercises General Exercises - Lower Extremity Ankle Circles/Pumps: AROM;Both;10 reps;Supine;Other (comment);AAROM (pt with decr ROM and strength in Rt LE )   PT Diagnosis:    PT Problem List:   PT Treatment Interventions:     PT Goals (current goals can now be found in the care plan section) Acute Rehab PT Goals Patient Stated Goal: return home PT Goal Formulation: With patient/family Time For Goal Achievement: 01/13/13 Potential to Achieve Goals: Fair  Visit Information  Last PT Received On: 12/31/12 Assistance Needed: +1 PT/OT Co-Evaluation/Treatment: Yes History of Present Illness: 72 yo F presenting with status epilepticus with a likely focus of chronic hemorrhage in the left temporal region and Right basal ganglia infarct    Subjective Data  Subjective: upon arrival; pt sitting  up EOB with OT; pt agreeable to attempt  ambulation. pt c/o back pain; RN present to medicate Patient Stated Goal: return home   Cognition  Cognition Arousal/Alertness: Awake/alert Behavior During Therapy: Flat affect Overall Cognitive Status: Impaired/Different from baseline Area of Impairment: Memory;Orientation;Following commands;Problem solving;Safety/judgement Orientation Level: Time Memory: Decreased short-term memory;Decreased recall of precautions Following Commands: Follows one step commands with increased time;Follows one step commands consistently Safety/Judgement: Decreased awareness of deficits Problem Solving: Slow processing;Decreased initiation;Difficulty sequencing;Requires verbal cues;Requires tactile cues General Comments: pt unaware of Rt sided weakness; perseverated on back pain; pt was able to recall proper button to push for nursing; unable to complete task when cued to do so; unsure if this is decreased processing vs. generalized weakness     Balance  Balance Balance Assessed: Yes Static Sitting Balance Static Sitting - Balance Support: Feet supported;Bilateral upper extremity supported Static Sitting - Level of Assistance: 4: Min assist;5: Stand by assistance Static Sitting - Comment/# of Minutes: pt was able to progress to SBA when Lt side of body was braced against foot of bed; pt requires bracing at all times to maintain sitting position at EOB; c/o back pain sitting EOB; tolerated sitting ~10 min with PT present   End of Session PT - End of Session Equipment Utilized During Treatment: Gait belt Activity Tolerance: Patient tolerated treatment well Patient left: in chair;with call bell/phone within reach;with family/visitor present Nurse Communication: Mobility status   GP     [place], Gross. PT [phone] 12/31/2012, 11:32 AM

## 2012-12-31 NOTE — Evaluation (Signed)
Occupational Therapy Evaluation Patient Details Name: [name] MRN: [id number] DOB: 08/24/1940 Today's Date: 12/31/2012 Time: 1308-6578 OT Time Calculation (min): 37 min  OT Assessment / Plan / Recommendation History of present illness 72 yo F presenting with status epilepticus with a likely focus of chronic hemorrhage in the left temporal region and Right basal ganglia infarct   Clinical Impression   Pt demos decline in function with ADLs and ADL mobility safety with decreased strength, balance, endurance and R UE ROM/function. Pt requires 2 person A for standing tasks and 1 person A for dynamic sitting tasks at EOB. Pt would benefit from acute OT services to address impairments to increase level of function and safety. Pt would be excellent candidate for CIR to help restore PLOF after acute care d/c    OT Assessment  Patient needs continued OT Services    Follow Up Recommendations  CIR;Supervision/Assistance - 24 hour    Barriers to Discharge Decreased caregiver support pt lives at home alone  Equipment Recommendations  Other (comment) (TBD)    Recommendations for Other Services Rehab consult  Frequency  Min 2X/week    Precautions / Restrictions Precautions Precautions: Fall Precaution Comments: right hemiparesis Restrictions Weight Bearing Restrictions: No   Pertinent Vitals/Pain Did not rate but stated that her back hurt during sup - sit to EOB    ADL  Grooming: Performed;Wash/dry hands;Wash/dry face;Moderate assistance Where Assessed - Grooming: Supported sitting Upper Body Bathing: Maximal assistance Lower Body Bathing: +1 Total assistance Upper Body Dressing: Maximal assistance Lower Body Dressing: +1 Total assistance Toilet Transfer: Simulated;Moderate assistance Toilet Transfer Method: Sit to stand Toileting - Clothing Manipulation and Hygiene: +1 Total assistance Where Assessed - Toileting Clothing Manipulation and Hygiene: Standing Tub/Shower Transfer  Method: Not assessed Equipment Used: Gait belt Transfers/Ambulation Related to ADLs: pt with increased difficulty achieving upright standing position secondary to Rt sided weakness; Rt LE blocked throughout transfers to prevent buckling; required (A) to maintain balance and maintain standing; pt tends to lean to Rt   ADL Comments: pt reluctant o initiate ADLs, slow processing    OT Diagnosis: Cognitive deficits;Generalized weakness;Hemiplegia non-dominant side  OT Problem List: Decreased strength;Decreased knowledge of use of DME or AE;Decreased knowledge of precautions;Decreased range of motion;Decreased activity tolerance;Decreased cognition;Impaired UE functional use;Pain;Decreased safety awareness;Impaired balance (sitting and/or standing) OT Treatment Interventions: Self-care/ADL training;Therapeutic exercise;Patient/family education;Neuromuscular education;Balance training;Therapeutic activities;DME and/or AE instruction   OT Goals(Current goals can be found in the care plan section) Acute Rehab OT Goals Patient Stated Goal: return home OT Goal Formulation: With patient Time For Goal Achievement: 01/07/13 Potential to Achieve Goals: Good ADL Goals Pt Will Perform Grooming: with min assist;sitting Pt Will Perform Upper Body Bathing: with mod assist;sitting Pt Will Perform Lower Body Bathing: with max assist;with mod assist;sitting/lateral leans Pt Will Perform Upper Body Dressing: with mod assist;sitting Pt Will Transfer to Toilet: with min assist;bedside commode Additional ADL Goal #1: Pt will increase sitting balance/tolerance to min A for support for ADLs and grooming tasks Additional ADL Goal #2: P will perform/tolerate AA/PROM of R UE in all planes while seated   Visit Information  Last OT Received On: 12/31/12 Assistance Needed: +1 History of Present Illness: 72 yo F presenting with status epilepticus with a likely focus of chronic hemorrhage in the left temporal region and  Right basal ganglia infarct       Prior Functioning     Home Living Family/patient expects to be discharged to:: Private residence Living Arrangements: Alone Available Help at Discharge: Family;Available  24 hours/day Type of Home: House Home Layout: One level Home Equipment: None Prior Function Level of Independence: Independent Communication Communication: No difficulties Dominant Hand: Left         Vision/Perception Vision - History Baseline Vision: Wears glasses all the time Patient Visual Report: No change from baseline Perception Perception: Within Functional Limits   Cognition  Cognition Arousal/Alertness: Awake/alert Behavior During Therapy: Flat affect Overall Cognitive Status: Impaired/Different from baseline Area of Impairment: Memory;Orientation;Following commands;Problem solving;Safety/judgement Orientation Level: Time Memory: Decreased short-term memory;Decreased recall of precautions Following Commands: Follows one step commands with increased time;Follows one step commands consistently Safety/Judgement: Decreased awareness of deficits Problem Solving: Slow processing;Decreased initiation;Difficulty sequencing;Requires verbal cues;Requires tactile cues General Comments: pt unaware of Rt sided weakness; perseverated on back pain; pt was able to recall proper button to push for nursing; unable to complete task when cued to do so; unsure if this is decreased processing vs. generalized weakness     Extremity/Trunk Assessment Upper Extremity Assessment Upper Extremity Assessment: Overall WFL for tasks assessed;Generalized weakness;RUE deficits/detail RUE Deficits / Details: hemiparesis, weakness. 2/5 grip strength, unable to assess elbow/shoulder RUE Coordination: decreased fine motor;decreased gross motor Lower Extremity Assessment Lower Extremity Assessment: Defer to PT evaluation Cervical / Trunk Assessment Cervical / Trunk Assessment: Normal      Mobility Bed Mobility Bed Mobility: Supine to Sit;Sitting - Scoot to Edge of Bed Supine to Sit: HOB elevated;2: Max assist Sitting - Scoot to [place] of Bed: 2: Max assist Details for Bed Mobility Assistance: pt reluctant to participtate in bed mobility to sit EOB abd would say "ok" when asked to participate but pt not initiating any movement Transfers Transfers: Sit to Stand;Stand to Sit Sit to Stand: 3: Mod assist;From bed;With upper extremity assist Stand to Sit: 3: Mod assist;To chair/3-in-1 Details for Transfer Assistance: pt with increased difficulty achieving upright standing position secondary to Rt sided weakness; Rt LE blocked throughout transfers to prevent buckling; required (A) to maintain balance and maintain standing; pt tends to lean to Rt       Exercise General Exercises - Lower Extremity Ankle Circles/Pumps: AROM;Both;10 reps;Supine;Other (comment);AAROM (pt with decr ROM and strength in Rt LE )   Balance Balance Balance Assessed: Yes Static Sitting Balance Static Sitting - Balance Support: Feet supported;Bilateral upper extremity supported Static Sitting - Level of Assistance: 4: Min assist;5: Stand by assistance Static Sitting - Comment/# of Minutes: pt was able to progress to SBA when Lt side of body was braced against foot of bed; pt requires bracing at all times to maintain sitting position at EOB; c/o back pain sitting EOB; tolerated sitting ~10 min with PT present  Dynamic Sitting Balance Dynamic Sitting - Balance Support: Left upper extremity supported;During functional activity;Feet unsupported Dynamic Sitting - Level of Assistance: 3: Mod assist;2: Max assist   End of Session OT - End of Session Equipment Utilized During Treatment: Gait belt Activity Tolerance: Other (comment);Patient limited by fatigue (slow processing of instructions, agitation) Patient left: in chair;with call bell/phone within reach;with family/visitor present  GO     Galen Manila 12/31/2012, 2:07 PM

## 2012-12-31 NOTE — Progress Notes (Signed)
Rehab Admissions Coordinator Note:  Patient was screened by [name] for appropriateness for an Inpatient Acute Rehab Consult.  At this time, we are recommending Inpatient Rehab consult.  [name] 12/31/2012, 9:02 AM  I can be reached at [phone].

## 2012-12-31 NOTE — Progress Notes (Signed)
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 Continue with  Kepra and dilantin. EEG: irregular left parietal-occipital delta activity, Left parieto-occipital sharp waves.  2-Acute 1 cm right basal ganglia infarct; ECHO with Ef 35 to 40 %. No need for TEE per neurology recommendation. Carotid doppler: Preliminary report: 1-39% ICA stenosis. Vertebral artery flow is antegrade. LDL 40, Hb A-1c; 5.2 -Continue with aspirin.  -CIR consulted.   3-Hypertension: Continue with coreg. Will consider start low dose lisinopril.  PRN hydralazine for SBP more than 190, diastolic more than 100.   4-Metabolic acidosis. Lactic acid was normal. Start sodium bicarb. Resolved.   5-Abdominal Pain: resolved.  Continue with protonix. KUB with Mildly prominent loops of colon in the right lower quadrant,  possibly reflecting adynamic colonic ileus. Pain has resolved. Monitor clinically. Lipase normal. Lactic acid normal.  Tolerating diet.   6-Acute renal insufficiency: cr on admission at 1.6. Decrease IV fluids due to cardiomyopathy. Cr has decrease to 1.04.   7-Encephalopathy/AMS; in setting of stroke and seizures. Improving.   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   9-Sinus tachycardia; HR 100. Monitor.  TSH at 0.83.  coreg due to cardiomyopathy.   10-Cardiomyopathy EF 35 to 40%, last Ef per Cath 2010 was normal at 55 %. Continue with coreg. I will start ACE when renal function allows it. Needs to follow up with Dr Eldridge Dace in 1 week.   Code Status: full code.  Family Communication: care discussed with patient.  Disposition Plan: CIR consulted.    Consultants:  Neurology.   Procedures:  ECHO;Left ventricle: The cavity size was normal. Systolic function was  moderately reduced. The estimated ejection fraction was in the range of 35% to 40%. Diffuse hypokinesis. - Mitral valve: Mild regurgitation. - Pulmonary arteries: PA peak pressure: 48mm Hg (S).   Doppler: Preliminary report: 1-39% ICA stenosis. Vertebral artery flow is antegrade.   Antibiotics:  none  HPI/Subjective: Patient sitting in chair was able to work with PT. She is sleepy but wake up to answer question. Fall sleep in between conversation.  She is answering questions. She just received a dose of Vicodin. She was complaining of pain prior working with PT.   Objective: Filed Vitals:   12/31/12 1019  BP: 149/94  Pulse: 108  Temp: 98.2 F (36.8 C)  Resp: 20    Intake/Output Summary (Last 24 hours) at 12/31/12 1318 Last data filed at 12/30/12 1900  Gross per 24 hour  Intake    265 ml  Output      0 ml  Net    265 ml   Filed Weights   12/29/12 0500 12/30/12 0400 12/30/12 2024  Weight: 40.7 kg (89 lb 11.6 oz) 42.9 kg (94 lb 9.2 oz) 42.23 kg (93 lb 1.6 oz)    Exam:   General:  Patient alert, answering questions.   Cardiovascular: S 1, S 2 RRR  Respiratory: CTA  Abdomen: BS present, soft, no tenderness, no rigidity.   Musculoskeletal: SCD, no edema.   Neuro: right side hemiparesis improving. Alert and oriented.   Data Reviewed: Basic Metabolic Panel:  Recent Labs Lab 12/28/12 0956 12/28/12 1953 12/29/12 0430 12/30/12 0530 12/31/12 0800  NA 133* 134* 135 136 137  K 5.4* 4.7 4.6 4.3 3.9  CL 98 100 102 103 98  CO2 20 18* 18* 17* 22  GLUCOSE 83 95 106* 72 109*  BUN 22 17 15 17 14   CREATININE 1.16* 1.14* 1.11* 1.34* 1.04  CALCIUM 9.3 8.9 8.6 8.8 9.5   Liver Function Tests:  Recent Labs Lab 12/28/12 0217 12/29/12 0430  AST 28 20  ALT 18 11  ALKPHOS 95 72  BILITOT 0.5 0.7  PROT 8.4* 6.9  ALBUMIN 4.3 3.2*    Recent Labs Lab 12/29/12 0900  LIPASE 18   No results found for this basename: AMMONIA,  in the last 168 hours CBC:  Recent  Labs Lab 12/28/12 0155 12/28/12 0217 12/29/12 0430 12/30/12 0530  WBC  --  8.9  10.4 7.6 5.9  NEUTROABS  --  4.4  --   --   HGB 16.7* 13.5  14.4 13.3 11.9*  HCT 49.0* 40.7  43.2 39.4 35.6*  MCV  --  99.5  101.4* 96.8 96.7  PLT  --  284  281 225 216   Cardiac Enzymes:  Recent Labs Lab 12/29/12 0430  TROPONINI <0.30   BNP (last 3 results) No results found for this basename: PROBNP,  in the last 8760 hours CBG: No results found for this basename: GLUCAP,  in the last 168 hours  Recent Results (from the past 240 hour(s))  MRSA PCR SCREENING     Status: None   Collection Time    12/28/12  1:43 PM      Result Value Range Status   MRSA by PCR NEGATIVE  NEGATIVE Final   Comment:            The GeneXpert MRSA Assay (FDA     approved for NASAL specimens     only), is one component of a     comprehensive MRSA colonization     surveillance program. It is not     intended to diagnose MRSA     infection nor to guide or     monitor treatment for     MRSA infections.     Studies: Dg Abd Portable 1v  12/29/2012   CLINICAL DATA:  Abdominal pain, epigastric tenderness  EXAM: PORTABLE ABDOMEN - 1 VIEW  COMPARISON:  CT abdomen pelvis dated 04/16/2012  FINDINGS: Mildly prominent loops of gas and stool fluid colon in the right lower abdomen, possibly reflecting adynamic colonic ileus, but nonobstructive.  Mild lumbar levoscoliosis.  IMPRESSION: Mildly prominent loops of colon in the right lower quadrant, possibly reflecting adynamic colonic ileus.  No evidence of bowel obstruction.   Electronically Signed   By: Charline Bills M.D.   On: 12/29/2012 15:39    Scheduled Meds: . antiseptic oral rinse  15 mL Mouth Rinse Q4H  . aspirin EC  325 mg Oral Daily  . carvedilol  3.125 mg Oral BID WC  . enoxaparin (LOVENOX) injection  30 mg Subcutaneous Q24H  . lacosamide (VIMPAT) IV  150 mg Intravenous Q12H  . levETIRAcetam  1,000 mg Intravenous Q12H  . pantoprazole (PROTONIX) IV  40 mg  Intravenous Q12H  . phenytoin (DILANTIN) IV  100 mg Intravenous Q8H  . sodium bicarbonate  650 mg Oral BID   Continuous Infusions: . sodium chloride 20 mL/hr at 12/30/12 1421    Active Problems:   Generalized seizure   Hypertension   Seizure   Metabolic acidosis   Encephalopathy   Acute renal failure    Time spent: 30 minutes.     REGALADO,BELKYS  [facility] Pager [phone]. If 7PM-7AM, please contact night-coverage at [email or web address], password [facility] 12/31/2012, 1:18 PM  LOS:  3 days

## 2012-12-31 NOTE — Progress Notes (Signed)
Subjective: Slightly more confused today  Exam: Filed Vitals:   12/31/12 1019  BP: 149/94  Pulse: 108  Temp: 98.2 F (36.8 C)  Resp: 20   Gen: In bed, NAD MS: Awake, Sleepy. Thinks she is at home ZO:XWRUE, VFF Motor: does not use her right arm as well as her left, 4/5 but appears weaker than yesterday.  Sensory:responds to touch in all 4 ext.   Impression: 72 yo F presenting with status epilepticus with a likely focus of chronic hemorrhage in the left temporal region. Echo with mildly low EF, but I do not think that this was a source. Dopplers are negative. LDL 40, ok. A1C. 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  Her right hemiparesis is slightly worse, but whether this is due to sedation or unwitnessed seizure I am not sure. I would favor increasing lacosamide slightly.  Recommendations: 1) Continue current Keppra and dilantin 2) Increase vimpat to 150mg  BID  Ritta Slot, MD [facility] [phone]  If 7pm- 7am, please page neurology on call at [phone].

## 2013-01-01 ENCOUNTER — Inpatient Hospital Stay (HOSPITAL_COMMUNITY): Payer: Medicare Other

## 2013-01-01 DIAGNOSIS — I6330 Cerebral infarction due to thrombosis of unspecified cerebral artery: Secondary | ICD-10-CM

## 2013-01-01 DIAGNOSIS — E872 Acidosis, unspecified: Secondary | ICD-10-CM

## 2013-01-01 DIAGNOSIS — R569 Unspecified convulsions: Secondary | ICD-10-CM

## 2013-01-01 LAB — BASIC METABOLIC PANEL
BUN: 22 mg/dL (ref 6–23)
CO2: 24 mEq/L (ref 19–32)
Calcium: 8.9 mg/dL (ref 8.4–10.5)
Chloride: 100 mEq/L (ref 96–112)
Creatinine, Ser: 1.48 mg/dL — ABNORMAL HIGH (ref 0.50–1.10)
GFR calc Af Amer: 40 mL/min — ABNORMAL LOW (ref >90)
GFR calc non Af Amer: 35 mL/min — ABNORMAL LOW (ref >90)
Glucose, Bld: 105 mg/dL — ABNORMAL HIGH (ref 70–99)
Potassium: 4.1 mEq/L (ref 3.5–5.1)
Sodium: 137 mEq/L (ref 135–145)

## 2013-01-01 LAB — PHENYTOIN LEVEL, TOTAL: Phenytoin Lvl: 19.2 ug/mL (ref 10.0–20.0)

## 2013-01-01 MED ORDER — SODIUM CHLORIDE 0.9 % IV SOLN
200.0000 mg | Freq: Two times a day (BID) | INTRAVENOUS | Status: DC
  Administered 2013-01-01 – 2013-01-02 (×2): 200 mg via INTRAVENOUS
  Filled 2013-01-01 (×4): qty 20

## 2013-01-01 MED ORDER — SODIUM CHLORIDE 0.9 % IV SOLN: INTRAVENOUS | Status: DC

## 2013-01-01 NOTE — Progress Notes (Signed)
Utilization review completed.  

## 2013-01-01 NOTE — Progress Notes (Signed)
Subjective: Improved from yesterady, but still not ack to where she was two days ago  Exam: Filed Vitals:   01/01/13 0740  BP: 118/81  Pulse: 93  Temp: 97.9 F (36.6 C)  Resp: 18   Gen: In bed, NAD MS: Awake, alert, knows that she is at [place].  WG:NFAOZ, VFF Motor: does not use her right arm as well as her left, 4/5 but appears weaker than yesterday. She also has to be physically prompted to use it.  Sensory:responds to touch in all 4 ext.   Impression: 72 yo F presenting with status epilepticus with a likely focus of chronic hemorrhage in the left temporal region. She was also found to have a RIGHT thalamic infarct as well as a RIGTH hemiparesis. Echo with mildly low EF, but I do not think that this was a source. Dopplers are negative. LDL 40, ok. A1C. 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  I continue to suspect that she may be having intermittent seizures that are unwitnessed, will increase vimpat.   Recommendations: 1) Continue current Keppra and dilantin 2) Increase vimpat to 200mg  BID 3) If no improvement by tomorrow, consider repeat EEG.   Ritta Slot, MD [facility] [phone]  If 7pm- 7am, please page neurology on call at [phone].

## 2013-01-01 NOTE — Progress Notes (Signed)
Occupational Therapy Treatment Patient Details Name: [name] MRN: [id number] DOB: 16-Dec-1940 Today's Date: 01/01/2013 Time: 0865-7846 OT Time Calculation (min): 25 min  OT Assessment / Plan / Recommendation  History of present illness 72 yo F presenting with status epilepticus with a likely focus of chronic hemorrhage in the left temporal region and Right basal ganglia infarct   OT comments  Pt more alert and agreeable today. Initially pt demonstrate difficulty attending to tasks so pt positioned in upright position in recliner and instructed to scoot forward on chair to work on sitting balance/tolerance. Pt participating more with R UE, however still weak with decreased AROM. Pt making progress with functional goals and should continue with acute OT services to address impairments to increase level of function and safety  Follow Up Recommendations  CIR;Supervision/Assistance - 24 hour    Barriers to Discharge   pt lives at home alone    Equipment Recommendations   TBD   Recommendations for Other Services Rehab consult  Frequency Min 2X/week   Progress towards OT Goals Progress towards OT goals: Progressing toward goals  Plan Discharge plan remains appropriate    Precautions / Restrictions Precautions Precautions: Fall Precaution Comments: right hemiparesis Restrictions Weight Bearing Restrictions: No   Pertinent Vitals/Pain Did not rate, c/o abdominal pain, nrsg aware    ADL  Grooming: Performed;Wash/dry hands;Wash/dry face;Minimal assistance Where Assessed - Grooming: Supported sitting Upper Body Bathing: Moderate assistance;Maximal assistance Where Assessed - Upper Body Bathing: Supported sitting Where Assessed - Lower Body Bathing: Supported sitting Upper Body Dressing: Performed;Moderate assistance;Maximal assistance Toilet Transfer: Performed;Moderate assistance Toilet Transfer Method: Sit to stand Toilet Transfer Equipment: Bedside commode Toileting - Clothing  Manipulation and Hygiene: +1 Total assistance Where Assessed - Toileting Clothing Manipulation and Hygiene: Standing Transfers/Ambulation Related to ADLs: pt required increased time to initiate sit - stand due to c/o abdominal pain, difficulty with upright standing positon due to R side weakness/decreased balance    OT Diagnosis:    OT Problem List:   OT Treatment Interventions:     OT Goals(current goals can now be found in the care plan section) Acute Rehab OT Goals Patient Stated Goal: return home  Visit Information  Last OT Received On: 01/01/13 Assistance Needed: +1 History of Present Illness: 72 yo F presenting with status epilepticus with a likely focus of chronic hemorrhage in the left temporal region and Right basal ganglia infarct    Subjective Data      Prior Functioning       Cognition  Cognition Arousal/Alertness: Awake/alert Behavior During Therapy: WFL for tasks assessed/performed Overall Cognitive Status: Impaired/Different from baseline Area of Impairment: Attention;Following commands Memory: Decreased short-term memory;Decreased recall of precautions Following Commands: Follows one step commands with increased time;Follows one step commands consistently Problem Solving: Slow processing;Decreased initiation;Difficulty sequencing;Requires verbal cues;Requires tactile cues    Mobility  Bed Mobility Bed Mobility: Not assessed Details for Bed Mobility Assistance: pt up in recliner Transfers Transfers: Sit to Stand;Stand to Sit Sit to Stand: From chair/3-in-1;With upper extremity assist;With armrests Stand to Sit: To chair/3-in-1;With upper extremity assist;With armrests Details for Transfer Assistance: pt required increased time to initiate sit - stand due to c/o abdominal pain, difficulty with upright standing positon due to R side weakness/decreased balance    Exercises  Other Exercises Other Exercises: AA/PROM of R UE in all planes x 10, pt demos decreased  difficulty wiht elbow flexion, grasping today   Balance Static Sitting Balance Static Sitting - Balance Support: Feet supported;Bilateral upper extremity supported;Left  upper extremity supported Static Sitting - Level of Assistance: 4: Min assist;5: Stand by assistance Dynamic Sitting Balance Dynamic Sitting - Balance Support: Left upper extremity supported;During functional activity;Feet unsupported Dynamic Sitting - Level of Assistance: 3: Mod assist   End of Session OT - End of Session Equipment Utilized During Treatment: Gait belt;Other (comment) (BSC) Activity Tolerance: Patient limited by fatigue Patient left: in chair;with call bell/phone within reach;with family/visitor present  GO     Galen Manila 01/01/2013, 1:26 PM

## 2013-01-01 NOTE — Progress Notes (Signed)
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 Continue with  Kepra and dilantin. EEG: irregular left parietal-occipital delta activity, Left parieto-occipital sharp waves. Patient had worsening of right side weakness. She was started on Vimpat. Neurology following.   2-Acute 1 cm right basal ganglia infarct; ECHO with Ef 35 to 40 %. No need for TEE per neurology recommendation. Carotid doppler: Preliminary report: 1-39% ICA stenosis. Vertebral artery flow is antegrade. LDL 40, Hb A-1c; 5.2 -Continue with aspirin.  -CIR consulted.   3-Hypertension: Continue with coreg. Will consider start low dose lisinopril if renal function allows it.  PRN hydralazine for SBP more than 190, diastolic more than 100.   4-Metabolic acidosis. Lactic acid was normal. Start sodium bicarb. Resolved.   5-Abdominal Pain: initially resolved.  Continue with protonix. KUB 11-29  with Mildly prominent loops of colon in the right lower quadrant, possibly reflecting adynamic colonic ileus.  Lipase normal. Lactic acid normal.  Tolerating diet. Patient with recurrence of abdominal pain 12-02. Will repeat KUB. Might need laxatives.   6-Acute renal insufficiency: cr on admission at 1.6. Resume IV low rate. Cr mildly increase today.  7-Encephalopathy/AMS; in setting of stroke and seizures. Improving.   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   9-Sinus tachycardia; HR 100. Monitor.  TSH at 0.83.  coreg due to cardiomyopathy.   10-Cardiomyopathy EF 35 to 40%, last Ef per Cath 2010 was normal at 55 %. Continue with coreg. I will start ACE when renal function allows it. Needs to follow up with Dr Eldridge Dace in 1 week.   Code Status: full code.  Family Communication: care discussed with  patient.  Disposition Plan: CIR consulted.    Consultants:  Neurology.   Procedures:  ECHO;Left ventricle: The cavity size was normal. Systolic function was moderately reduced. The estimated ejection fraction was in the range of 35% to 40%. Diffuse hypokinesis. - Mitral valve: Mild regurgitation. - Pulmonary arteries: PA peak pressure: 48mm Hg (S).   Doppler: Preliminary report: 1-39% ICA stenosis. Vertebral artery flow is antegrade.   Antibiotics:  none  HPI/Subjective: Patient  complaining  Of abdominal pain today. No BM in 4 days. No nausea, or vomiting.   Objective: Filed Vitals:   01/01/13 0740  BP: 118/81  Pulse: 93  Temp: 97.9 F (36.6 C)  Resp: 18    Intake/Output Summary (Last 24 hours) at 01/01/13 1214 Last data filed at 01/01/13 0840  Gross per 24 hour  Intake    630 ml  Output      0 ml  Net    630 ml   Filed Weights   12/30/12 0400 12/30/12 2024 12/31/12 2109  Weight: 42.9 kg (94 lb 9.2 oz) 42.23 kg (93 lb 1.6 oz) 41.459 kg (91 lb 6.4 oz)    Exam:   General:  Patient alert, answering questions.   Cardiovascular: S 1, S 2 RRR  Respiratory: CTA  Abdomen: BS present, soft,mild  tenderness, no rigidity.   Musculoskeletal: SCD, no edema.   Neuro: right side hemiparesis . Marland Kitchen Alert and oriented.   Data Reviewed: Basic Metabolic Panel:  Recent Labs Lab 12/28/12 1953 12/29/12 0430 12/30/12 0530 12/31/12 0800 01/01/13 0530  NA 134* 135 136 137 137  K 4.7 4.6 4.3 3.9 4.1  CL 100 102 103 98 100  CO2 18* 18* 17* 22 24  GLUCOSE 95 106* 72 109* 105*  BUN 17 15 17 14 22   CREATININE 1.14* 1.11* 1.34* 1.04 1.48*  CALCIUM 8.9 8.6 8.8 9.5 8.9   Liver Function Tests:  Recent Labs Lab 12/28/12 0217 12/29/12 0430  AST 28 20  ALT 18 11  ALKPHOS 95 72  BILITOT 0.5 0.7  PROT 8.4* 6.9  ALBUMIN 4.3 3.2*    Recent Labs Lab 12/29/12 0900  LIPASE 18   No results found for this basename: AMMONIA,  in the last 168 hours CBC:  Recent  Labs Lab 12/28/12 0155 12/28/12 0217 12/29/12 0430 12/30/12 0530  WBC  --  8.9  10.4 7.6 5.9  NEUTROABS  --  4.4  --   --   HGB 16.7* 13.5  14.4 13.3 11.9*  HCT 49.0* 40.7  43.2 39.4 35.6*  MCV  --  99.5  101.4* 96.8 96.7  PLT  --  284  281 225 216   Cardiac Enzymes:  Recent Labs Lab 12/29/12 0430  TROPONINI <0.30   BNP (last 3 results) No results found for this basename: PROBNP,  in the last 8760 hours CBG: No results found for this basename: GLUCAP,  in the last 168 hours  Recent Results (from the past 240 hour(s))  MRSA PCR SCREENING     Status: None   Collection Time    12/28/12  1:43 PM      Result Value Range Status   MRSA by PCR NEGATIVE  NEGATIVE Final   Comment:            The GeneXpert MRSA Assay (FDA     approved for NASAL specimens     only), is one component of a     comprehensive MRSA colonization     surveillance program. It is not     intended to diagnose MRSA     infection nor to guide or     monitor treatment for     MRSA infections.     Studies: No results found.  Scheduled Meds: . antiseptic oral rinse  15 mL Mouth Rinse Q4H  . aspirin EC  325 mg Oral Daily  . carvedilol  3.125 mg Oral BID WC  . enoxaparin (LOVENOX) injection  30 mg Subcutaneous Q24H  . lacosamide (VIMPAT) IV  200 mg Intravenous Q12H  . levETIRAcetam  1,000 mg Intravenous Q12H  . pantoprazole (PROTONIX) IV  40 mg Intravenous Q12H  . phenytoin (DILANTIN) IV  100 mg Intravenous Q8H   Continuous Infusions: . sodium chloride      Active Problems:   Generalized seizure   Hypertension   Seizure   Metabolic acidosis   Encephalopathy   Acute renal failure    Time spent: 30 minutes.     Cian Costanzo  [facility] Pager [phone]. If 7PM-7AM, please contact night-coverage at [email or web address], password [facility] 01/01/2013, 12:14 PM  LOS: 4 days

## 2013-01-01 NOTE — Consult Note (Signed)
Physical Medicine and Rehabilitation Consult Reason for Consult: CVA/seizure disorder Referring Physician: [facility]   HPI: [name] is a 72 y.o. right-handed female with history of hypertension as well as myocardial infarction maintained on aspirin therapy. Independent prior to admission living alone and driving. Admitted 12/28/2012 with altered mental status as well as seizure. MRI of the brain showed acute 1 cm right basal ganglia infarction as well as chronic hemorrhage left temporal region. Echocardiogram with ejection fraction of 40% with diffuse hypokinesis. Carotid Dopplers with no ICA stenosis. EEG consistent with a potential area for epileptogenicity in the left parietal-occipital region. Patient loaded with Keppra/Dilantin as well as Vimpat. Maintained on aspirin for CVA prophylaxis as well as the addition of subcutaneous Lovenox for DVT prophylaxis. Maintained on a regular consistency diet. Physical occupational therapy evaluations completed an ongoing with recommendations of physical medicine rehabilitation consult to consider inpatient rehabilitation services   Review of Systems  Musculoskeletal: Positive for joint pain and myalgias.  All other systems reviewed and are negative.   Past Medical History  Diagnosis Date  . MI (myocardial infarction)   . Hypertension    History reviewed. No pertinent past surgical history. No family history on file. Social History:  reports that she has never smoked. She does not have any smokeless tobacco history on file. She reports that she does not drink alcohol or use illicit drugs. Allergies: No Known Allergies Medications Prior to Admission  Medication Sig Dispense Refill  . alendronate (FOSAMAX) 70 MG tablet Take 70 mg by mouth once a week. Take with a full glass of water on an empty stomach.      Marland Kitchen amLODipine (NORVASC) 10 MG tablet Take 10 mg by mouth daily.      Marland Kitchen aspirin 325 MG tablet Take 325 mg by mouth daily.        Home: Home  Living Family/patient expects to be discharged to:: Private residence Living Arrangements: Alone Available Help at Discharge: Family;Available 24 hours/day Type of Home: House Home Layout: One level Home Equipment: None  Functional History:   Functional Status:  Mobility: Bed Mobility Bed Mobility: Supine to Sit;Sitting - Scoot to Edge of Bed Rolling Left: 3: Mod assist;With rail Left Sidelying to Sit: 3: Mod assist;HOB elevated;With rails Supine to Sit: HOB elevated;2: Max assist Sitting - Scoot to [place] of Bed: 2: Max assist Transfers Transfers: Sit to Stand;Stand to [employer] Transfers Sit to Stand: 3: Mod assist;From bed;With upper extremity assist Stand to Sit: 3: Mod assist;To chair/3-in-1 Stand Pivot Transfers: 3: Mod assist Ambulation/Gait Ambulation/Gait Assistance: 1: +2 Total assist Ambulation/Gait: Patient Percentage: 60% Ambulation Distance (Feet): 4 Feet Assistive device: 2 person hand held assist Ambulation/Gait Assistance Details: pt able to advance LEs minimally but with Rt LE continuously buckling; pt with narrow BOS and requires 2 person (A) to maintain balance and (A) facilitating weightshift. pt returned to bed and then performed SPT to chair for safety Gait Pattern: Right flexed knee in stance;Narrow base of support;Trunk flexed;Shuffle;Decreased hip/knee flexion - right;Decreased stride length Gait velocity: very decreased  Stairs: No Wheelchair Mobility Wheelchair Mobility: No  ADL: ADL Grooming: Performed;Wash/dry hands;Wash/dry face;Moderate assistance Where Assessed - Grooming: Supported sitting Upper Body Bathing: Maximal assistance Lower Body Bathing: +1 Total assistance Upper Body Dressing: Maximal assistance Lower Body Dressing: +1 Total assistance Toilet Transfer: Simulated;Moderate assistance Toilet Transfer Method: Sit to stand Tub/Shower Transfer Method: Not assessed Equipment Used: Gait belt Transfers/Ambulation Related to ADLs:  pt with increased difficulty achieving upright standing position secondary to  Rt sided weakness; Rt LE blocked throughout transfers to prevent buckling; required (A) to maintain balance and maintain standing; pt tends to lean to Rt   ADL Comments: pt reluctant o initiate ADLs, slow processing  Cognition: Cognition Overall Cognitive Status: Impaired/Different from baseline Orientation Level: Oriented to person;Oriented to place;Oriented to situation Cognition Arousal/Alertness: Awake/alert Behavior During Therapy: Flat affect Overall Cognitive Status: Impaired/Different from baseline Area of Impairment: Memory;Orientation;Following commands;Problem solving;Safety/judgement Orientation Level: Time Memory: Decreased short-term memory;Decreased recall of precautions Following Commands: Follows one step commands with increased time;Follows one step commands consistently Safety/Judgement: Decreased awareness of deficits Problem Solving: Slow processing;Decreased initiation;Difficulty sequencing;Requires verbal cues;Requires tactile cues General Comments: pt unaware of Rt sided weakness; perseverated on back pain; pt was able to recall proper button to push for nursing; unable to complete task when cued to do so; unsure if this is decreased processing vs. generalized weakness   Blood pressure 118/81, pulse 96, temperature 98.8 F (37.1 C), temperature source Oral, resp. rate 18, height 5' (1.524 m), weight 41.459 kg (91 lb 6.4 oz), SpO2 95.00%. Physical Exam  Vitals reviewed. Constitutional: She is oriented to person, place, and time.  HENT:  Head: Normocephalic.  Eyes: EOM are normal.  Neck: Normal range of motion. Neck supple. No thyromegaly present.  Cardiovascular: Normal rate and regular rhythm.   Respiratory: Effort normal and breath sounds normal. No respiratory distress.  GI: Soft. Bowel sounds are normal. She exhibits no distension.  Neurological: She is alert and oriented to person,  place, and time.  Followed simple commands. Mild left facial weakness. Speech slightly dysarthric. Has more difficulties initiating with the right arm and leg. Difficult to assess strength but grossly 2+ to 3+/5 RUE and RLE. LUE and LLE are 4/5 and she moves these more spontaneously. In general very restless. Is able to sustain attention for 10-15 seconds.   Skin: Skin is warm and dry.  Psychiatric:  Flat    Results for orders placed during the hospital encounter of 12/28/12 (from the past 24 hour(s))  BASIC METABOLIC PANEL     Status: Abnormal   Collection Time    12/31/12  8:00 AM      Result Value Range   Sodium 137  135 - 145 mEq/L   Potassium 3.9  3.5 - 5.1 mEq/L   Chloride 98  96 - 112 mEq/L   CO2 22  19 - 32 mEq/L   Glucose, Bld 109 (*) 70 - 99 mg/dL   BUN 14  6 - 23 mg/dL   Creatinine, Ser 4.09  0.50 - 1.10 mg/dL   Calcium 9.5  8.4 - 81.1 mg/dL   GFR calc non Af Amer 53 (*) >90 mL/min   GFR calc Af Amer 62 (*) >90 mL/min   No results found.  Assessment/Plan: Diagnosis: right BG infarct, subacute left temporal hemorrhage,  1. Does the need for close, 24 hr/day medical supervision in concert with the patient's rehab needs make it unreasonable for this patient to be served in a less intensive setting? Yes 2. Co-Morbidities requiring supervision/potential complications: 780.39 3. Due to bladder management, bowel management, safety, skin/wound care, disease management, medication administration, pain management and patient education, does the patient require 24 hr/day rehab nursing? Yes 4. Does the patient require coordinated care of a physician, rehab nurse, PT (1-2 hrs/day, 5 days/week), OT (1-2 hrs/day, 5 days/week) and SLP (1-2 hrs/day, 5 days/week) to address physical and functional deficits in the context of the above medical diagnosis(es)? Yes Addressing deficits in  the following areas: balance, endurance, locomotion, strength, transferring, bowel/bladder control, bathing,  dressing, feeding, grooming, toileting, cognition, speech, language, swallowing and psychosocial support 5. Can the patient actively participate in an intensive therapy program of at least 3 hrs of therapy per day at least 5 days per week? Yes 6. The potential for patient to make measurable gains while on inpatient rehab is excellent 7. Anticipated functional outcomes upon discharge from inpatient rehab are supervision to minimal assist with PT, supervision to minimal assist with OT, supervision to mod I with SLP. 8. Estimated rehab length of stay to reach the above functional goals is: 12-18 days 9. Does the patient have adequate social supports to accommodate these discharge functional goals? Yes? 10. Anticipated D/C setting: Home 11. Anticipated post D/C treatments: HH therapy 12. Overall Rehab/Functional Prognosis: excellent  RECOMMENDATIONS: This patient's condition is appropriate for continued rehabilitative care in the following setting: CIR Patient has agreed to participate in recommended program. Yes Note that insurance prior authorization may be required for reimbursement for recommended care.  Comment: Rehab RN to follow up. ?dispo   Ranelle Oyster, MD, [place] [facility]     01/01/2013

## 2013-01-01 NOTE — Progress Notes (Signed)
I will begin insurance approval for a possible inpt rehab admission pending approval, family assistance at home and bed availability. [phone]

## 2013-01-02 DIAGNOSIS — I6350 Cerebral infarction due to unspecified occlusion or stenosis of unspecified cerebral artery: Principal | ICD-10-CM

## 2013-01-02 LAB — CBC
HCT: 36.1 % (ref 36.0–46.0)
Hemoglobin: 12.2 g/dL (ref 12.0–15.0)
MCH: 32.5 pg (ref 26.0–34.0)
MCHC: 33.8 g/dL (ref 30.0–36.0)
MCV: 96.3 fL (ref 78.0–100.0)
Platelets: 224 10*3/uL (ref 150–400)
RBC: 3.75 MIL/uL — ABNORMAL LOW (ref 3.87–5.11)
RDW: 16 % — ABNORMAL HIGH (ref 11.5–15.5)
WBC: 7.4 10*3/uL (ref 4.0–10.5)

## 2013-01-02 LAB — BASIC METABOLIC PANEL
BUN: 21 mg/dL (ref 6–23)
CO2: 22 mEq/L (ref 19–32)
Calcium: 8.5 mg/dL (ref 8.4–10.5)
Chloride: 105 mEq/L (ref 96–112)
Creatinine, Ser: 1.29 mg/dL — ABNORMAL HIGH (ref 0.50–1.10)
GFR calc Af Amer: 47 mL/min — ABNORMAL LOW (ref >90)
GFR calc non Af Amer: 41 mL/min — ABNORMAL LOW (ref >90)
Glucose, Bld: 99 mg/dL (ref 70–99)
Potassium: 4.1 mEq/L (ref 3.5–5.1)
Sodium: 139 mEq/L (ref 135–145)

## 2013-01-02 MED ORDER — ENOXAPARIN SODIUM 30 MG/0.3ML ~~LOC~~ SOLN
20.0000 mg | SUBCUTANEOUS | Status: DC
  Administered 2013-01-03 – 2013-01-04 (×2): 20 mg via SUBCUTANEOUS
  Filled 2013-01-02 (×5): qty 0.2

## 2013-01-02 MED ORDER — OXYCODONE-ACETAMINOPHEN 5-325 MG PO TABS
1.0000 | ORAL_TABLET | Freq: Once | ORAL | Status: AC
  Administered 2013-01-02: 1 via ORAL
  Filled 2013-01-02: qty 1

## 2013-01-02 MED ORDER — PHENYTOIN 50 MG PO CHEW
100.0000 mg | CHEWABLE_TABLET | Freq: Three times a day (TID) | ORAL | Status: DC
  Administered 2013-01-02 – 2013-01-03 (×3): 100 mg via ORAL
  Filled 2013-01-02 (×6): qty 2

## 2013-01-02 MED ORDER — LACOSAMIDE 200 MG PO TABS
200.0000 mg | ORAL_TABLET | Freq: Two times a day (BID) | ORAL | Status: DC
  Administered 2013-01-02 – 2013-01-03 (×2): 200 mg via ORAL
  Filled 2013-01-02 (×2): qty 1

## 2013-01-02 MED ORDER — LEVETIRACETAM 500 MG PO TABS
1000.0000 mg | ORAL_TABLET | Freq: Two times a day (BID) | ORAL | Status: DC
  Administered 2013-01-02 – 2013-01-04 (×4): 1000 mg via ORAL
  Filled 2013-01-02 (×6): qty 2

## 2013-01-02 MED ORDER — PHENYTOIN 50 MG PO CHEW
100.0000 mg | CHEWABLE_TABLET | Freq: Three times a day (TID) | ORAL | Status: DC
  Filled 2013-01-02 (×3): qty 2

## 2013-01-02 MED ORDER — SENNA 8.6 MG PO TABS
1.0000 | ORAL_TABLET | Freq: Every day | ORAL | Status: DC
  Administered 2013-01-02 – 2013-01-04 (×3): 8.6 mg via ORAL
  Filled 2013-01-02 (×3): qty 1

## 2013-01-02 MED ORDER — POLYETHYLENE GLYCOL 3350 17 G PO PACK
17.0000 g | PACK | Freq: Every day | ORAL | Status: DC
  Administered 2013-01-02 – 2013-01-04 (×3): 17 g via ORAL
  Filled 2013-01-02 (×3): qty 1

## 2013-01-02 MED ORDER — DOCUSATE SODIUM 100 MG PO CAPS
100.0000 mg | ORAL_CAPSULE | Freq: Two times a day (BID) | ORAL | Status: DC
  Administered 2013-01-02 – 2013-01-04 (×5): 100 mg via ORAL
  Filled 2013-01-02 (×6): qty 1

## 2013-01-02 NOTE — Progress Notes (Signed)
Tap water enema given. Pt only tolerated 50% of the bag, before pt was unable to hold anymore. Pt sat on bedside commode for 5 minutes, but was unable to have a BM. Will notify RN and continue to monitor.

## 2013-01-02 NOTE — Progress Notes (Signed)
I have left a message for pt's daughter to call me to discuss what type of assistance pt has avaialble to her once she is discharged. Inpt rehab venue vs SNF pending that discussion. [phone]

## 2013-01-02 NOTE — Progress Notes (Signed)
NEURO HOSPITALIST PROGRESS NOTE   SUBJECTIVE:                                                                                                                        She id feeling much better and right arm has improved strength.   OBJECTIVE:                                                                                                                           Vital signs in last 24 hours: Temp:  [98.1 F (36.7 C)-99.1 F (37.3 C)] 99.1 F (37.3 C) (12/03 0900) Pulse Rate:  [94-119] 110 (12/03 0900) Resp:  [18-19] 19 (12/03 0900) BP: (114-133)/(74-94) 129/88 mmHg (12/03 0900) SpO2:  [93 %-100 %] 94 % (12/03 0900) Weight:  [41.459 kg (91 lb 6.4 oz)] 41.459 kg (91 lb 6.4 oz) (12/02 2110)  Intake/Output from previous day: 12/02 0701 - 12/03 0700 In: 1140 [P.O.:720; IV Piggyback:420] Out: -  Intake/Output this shift: Total I/O In: 120 [P.O.:120] Out: -  Nutritional status: Cardiac  Past Medical History  Diagnosis Date  . MI (myocardial infarction)   . Hypertension      Neurologic Exam:   Mental Status: Alert, oriented, thought content appropriate.  Speech fluent without evidence of aphasia.  Able to follow 3 step commands without difficulty. Cranial Nerves: II: Discs flat bilaterally; Visual fields grossly normal, pupils equal, round, reactive to light and accommodation III,IV, VI: ptosis not present, extra-ocular motions intact bilaterally V,VII: smile symmetric, facial light touch sensation normal bilaterally VIII: hearing normal bilaterally IX,X: gag reflex present XI: bilateral shoulder shrug XII: midline tongue extension without atrophy or fasciculations  Motor: Right : Upper extremity   5/5    Left:     Upper extremity   4+/5  Lower extremity   5/5     Lower extremity   5/5 Tone and bulk:normal tone throughout; no atrophy noted Sensory: Pinprick and light touch intact throughout, bilaterally Deep Tendon Reflexes:  Right:  Upper Extremity   Left: Upper extremity   biceps (C-5 to C-6) 2/4   biceps (C-5 to C-6) 2/4 tricep (C7) 2/4    triceps (C7) 2/4 Brachioradialis (C6) 2/4  Brachioradialis (C6) 2/4  Lower Extremity Lower  Extremity  quadriceps (L-2 to L-4) 2/4   quadriceps (L-2 to L-4) 2/4 Achilles (S1) 1/4   Achilles (S1) 1/4  Plantars: Right: downgoing   Left: downgoing Cerebellar: normal finger-to-nose,  normal heel-to-shin test CV: pulses palpable throughout   Lab Results: Lab Results  Component Value Date/Time   CHOL 141 12/30/2012  5:30 AM   Lipid Panel No results found for this basename: CHOL, TRIG, HDL, CHOLHDL, VLDL, LDLCALC,  in the last 72 hours  Studies/Results: Dg Abd 1 View  01/01/2013   CLINICAL DATA:  Abdominal pain.  Constipation.  EXAM: ABDOMEN - 1 VIEW  COMPARISON:  Radiograph dated 12/29/2012  FINDINGS: There is air throughout the colon with some stool in the ascending and hepatic flexure regions. There is no fecal impaction. No dilated small bowel.  Osseous structures are normal.  IMPRESSION: Prominent gas in the nondistended colon.  No fecal impaction.   Electronically Signed   By: Geanie Cooley M.D.   On: 01/01/2013 18:32    MEDICATIONS                                                                                                                        Scheduled: . antiseptic oral rinse  15 mL Mouth Rinse Q4H  . aspirin EC  325 mg Oral Daily  . carvedilol  3.125 mg Oral BID WC  . docusate sodium  100 mg Oral BID  . enoxaparin (LOVENOX) injection  30 mg Subcutaneous Q24H  . lacosamide  200 mg Oral BID  . levETIRAcetam  1,000 mg Oral BID  . pantoprazole (PROTONIX) IV  40 mg Intravenous Q12H  . phenytoin  100 mg Oral Q8H  . polyethylene glycol  17 g Oral Daily  . senna  1 tablet Oral Daily    ASSESSMENT/PLAN:                                                                                                             Seizures: No further seizures and increased right UE  strength .  Recommend continue current AED regime.  Follow up as out patient ([facility] or [facility] as a out patient. )  Neurology S/O  Assessment and plan discussed with with attending physician and they are in agreement.    Felicie Morn PA-C [facility] [phone]  01/02/2013, 1:29 PM

## 2013-01-02 NOTE — Progress Notes (Signed)
[facility] PROGRESS NOTE  Almina Schul ONG:[id number] DOB: 11-17-1940 DOA: 12/28/2012 PCP: Default, Provider, MD  Assessment/Plan: 72 y.o. female with PMH significant for hypertension, MI who was brought to ED by family member with AMS. History obtain from family. Patient unable to provide history due to AMS. Patient become confuse, not following command, weak. On her way to ED friend witness generalize muscle movement.  1. 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 Continue with Kepra and dilantin. EEG: irregular left parietal-occipital delta activity, Left parieto-occipital sharp waves. Patient had worsening of right side weakness. She was started on Vimpat. Neurology following. changed meds PO  2. 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 No need for TEE per neurology recommendation. Carotid doppler: Preliminary report: 1-39% ICA stenosis. Vertebral artery flow is antegrade. LDL 40, Hb A-1c; 5.2  -Continue with aspirin. CIR consulted.   3. Hypertension: Continue with coreg. Will consider start low dose lisinopril if renal function allows it. PRN hydralazine for SBP more than 190, diastolic more than 100.   4. Metabolic acidosis. Lactic acid was normal. Resolved.   5. Abdominal Pain: initially resolved. Continue with protonix. KUB 11-29 with Mildly prominent loops of colon in the right lower quadrant, possibly reflecting adynamic colonic ileus.  Lipase normal. Lactic acid normal.  Tolerating diet. Patient with recurrence of abdominal pain 12-02. Will repeat KUB. Might need laxatives.   6. Acute renal insufficiency: cr on admission at 1.6. Resume IV low rate. Cr mildly increase today.   7. Encephalopathy/AMS; in setting of stroke and seizures. Improving.   8. 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   9. Sinus tachycardia; HR 100. Monitor. TSH at 0.83. coreg due  to cardiomyopathy.   10. Cardiomyopathy EF 35 to 40%, last Ef per Cath 2010 was normal at 55 %. Continue with coreg. I will start ACE when renal function allows it. Needs to follow up with Dr Eldridge Dace in 1 week.      Code Status: full Family Communication: none at the bedside (indicate person spoken with, relationship, and if by phone, the number) Disposition Plan: pend CIR eval   Consultants:  Neurology   Procedures: ECHO;Left ventricle: The cavity size was normal. Systolic function was moderately reduced. The estimated ejection fraction was in the range of 35% to 40%. Diffuse hypokinesis. - Mitral valve: Mild regurgitation. - Pulmonary arteries: PA peak pressure: 48mm Hg (S).  Doppler: Preliminary report: 1-39% ICA stenosis. Vertebral artery flow is antegrade.   Antibiotics:  None  (indicate start date, and stop date if known)  HPI/Subjective: alert  Objective: Filed Vitals:   01/02/13 0415  BP: 114/74  Pulse: 94  Temp: 98.1 F (36.7 C)  Resp: 18    Intake/Output Summary (Last 24 hours) at 01/02/13 0950 Last data filed at 01/02/13 0606  Gross per 24 hour  Intake   1020 ml  Output      0 ml  Net   1020 ml   Filed Weights   12/30/12 2024 12/31/12 2109 01/01/13 2110  Weight: 42.23 kg (93 lb 1.6 oz) 41.459 kg (91 lb 6.4 oz) 41.459 kg (91 lb 6.4 oz)    Exam:   General:  alert  Cardiovascular: s1,s2 rrr  Respiratory: CTA BL  Abdomen: soft, nt, nd   Musculoskeletal: no LE edema   Data Reviewed: Basic Metabolic Panel:  Recent Labs Lab 12/29/12 0430 12/30/12 0530 12/31/12 0800 01/01/13 0530  01/02/13 0535  NA 135 136 137 137 139  K 4.6 4.3 3.9 4.1 4.1  CL 102 103 98 100 105  CO2 18* 17* 22 24 22   GLUCOSE 106* 72 109* 105* 99  BUN 15 17 14 22 21   CREATININE 1.11* 1.34* 1.04 1.48* 1.29*  CALCIUM 8.6 8.8 9.5 8.9 8.5   Liver Function Tests:  Recent Labs Lab 12/28/12 0217 12/29/12 0430  AST 28 20  ALT 18 11  ALKPHOS 95 72  BILITOT 0.5  0.7  PROT 8.4* 6.9  ALBUMIN 4.3 3.2*    Recent Labs Lab 12/29/12 0900  LIPASE 18   No results found for this basename: AMMONIA,  in the last 168 hours CBC:  Recent Labs Lab 12/28/12 0155 12/28/12 0217 12/29/12 0430 12/30/12 0530 01/02/13 0535  WBC  --  8.9  10.4 7.6 5.9 7.4  NEUTROABS  --  4.4  --   --   --   HGB 16.7* 13.5  14.4 13.3 11.9* 12.2  HCT 49.0* 40.7  43.2 39.4 35.6* 36.1  MCV  --  99.5  101.4* 96.8 96.7 96.3  PLT  --  284  281 225 216 224   Cardiac Enzymes:  Recent Labs Lab 12/29/12 0430  TROPONINI <0.30   BNP (last 3 results) No results found for this basename: PROBNP,  in the last 8760 hours CBG: No results found for this basename: GLUCAP,  in the last 168 hours  Recent Results (from the past 240 hour(s))  MRSA PCR SCREENING     Status: None   Collection Time    12/28/12  1:43 PM      Result Value Range Status   MRSA by PCR NEGATIVE  NEGATIVE Final   Comment:            The GeneXpert MRSA Assay (FDA     approved for NASAL specimens     only), is one component of a     comprehensive MRSA colonization     surveillance program. It is not     intended to diagnose MRSA     infection nor to guide or     monitor treatment for     MRSA infections.     Studies: Dg Abd 1 View  01/01/2013   CLINICAL DATA:  Abdominal pain.  Constipation.  EXAM: ABDOMEN - 1 VIEW  COMPARISON:  Radiograph dated 12/29/2012  FINDINGS: There is air throughout the colon with some stool in the ascending and hepatic flexure regions. There is no fecal impaction. No dilated small bowel.  Osseous structures are normal.  IMPRESSION: Prominent gas in the nondistended colon.  No fecal impaction.   Electronically Signed   By: Geanie Cooley M.D.   On: 01/01/2013 18:32    Scheduled Meds: . antiseptic oral rinse  15 mL Mouth Rinse Q4H  . aspirin EC  325 mg Oral Daily  . carvedilol  3.125 mg Oral BID WC  . enoxaparin (LOVENOX) injection  30 mg Subcutaneous Q24H  . lacosamide  (VIMPAT) IV  200 mg Intravenous Q12H  . levETIRAcetam  1,000 mg Intravenous Q12H  . pantoprazole (PROTONIX) IV  40 mg Intravenous Q12H  . phenytoin (DILANTIN) IV  100 mg Intravenous Q8H   Continuous Infusions: . sodium chloride      Active Problems:   Generalized seizure   Hypertension   Seizure   Metabolic acidosis   Encephalopathy   Acute renal failure    Time spent: >35 minutes  Esperanza Sheets  [facility] Pager [phone]. If 7PM-7AM, please contact night-coverage at [email or web address], password [facility] 01/02/2013, 9:50 AM  LOS: 5 days

## 2013-01-02 NOTE — Progress Notes (Addendum)
PT Treatment 01/01/13 2100  PT Visit Information  Last PT Received On 01/01/13  Assistance Needed +1  History of Present Illness pt limited by back and abdominal pain today  Cognition  Arousal/Alertness Awake/alert  Behavior During Therapy [facility] for tasks assessed/performed  Overall Cognitive Status Impaired/Different from baseline  Area of Impairment Attention;Following commands  Memory Decreased short-term memory;Decreased recall of precautions  Following Commands Follows one step commands with increased time;Follows one step commands consistently  Problem Solving Slow processing;Decreased initiation;Difficulty sequencing;Requires verbal cues;Requires tactile cues  General Comments pt attempted to participate with PT, lilmited by back pain and kept eyes closed during treatment  Bed Mobility  Bed Mobility Rolling Left;Rolling Right;Supine to Sit;Sit to Supine  Rolling Right 3: Mod assist;With rail  Rolling Left 3: Mod assist;With rail  Left Sidelying to Sit 3: Mod assist;HOB elevated;With rails  Supine to Sit HOB elevated;2: Max assist  Details for Bed Mobility Assistance pt c/o back pain with mobiilty  Transfers  Transfers Not assessed  Ambulation/Gait  Ambulation/Gait Assistance Not tested (comment)  Balance  Balance Assessed Yes  Static Sitting Balance  Static Sitting - Balance Support Bilateral upper extremity supported;Feet supported  Static Sitting - Level of Assistance 5: Stand by assistance  Static Sitting - Comment/# of Minutes Pt limited by pain   General Exercises - Lower Extremity  Ankle Circles/Pumps Both;10 reps;AAROM  Short Arc Quad AROM;Both;10 reps;Supine  Heel Slides AAROM;Both;10 reps;Supine  Hip ABduction/ADduction AAROM;Both;10 reps;Supine  PT - End of Session  Activity Tolerance Patient limited by pain  Patient left in bed;with call bell/phone within reach  Nurse Communication Mobility status  PT Plan  PT Frequency Min 4X/week  PT Recommendation  PT  equipment Rolling walker with 5" wheels  PT Time Calculation  PT Start Time 1541  PT Stop Time 1604  PT Time Calculation (min) 23 min    Treatment session conducted by Ebony Hail, PT   Marella Bile PT supervisor pulled into note)

## 2013-01-03 MED ORDER — SENNA 8.6 MG PO TABS: 1.0000 | ORAL_TABLET | Freq: Every day | ORAL | Status: DC

## 2013-01-03 MED ORDER — PHENYTOIN 50 MG PO CHEW: 100.0000 mg | CHEWABLE_TABLET | Freq: Three times a day (TID) | ORAL | Status: DC

## 2013-01-03 MED ORDER — POLYETHYLENE GLYCOL 3350 17 G PO PACK: 17.0000 g | PACK | Freq: Every day | ORAL | Status: DC

## 2013-01-03 MED ORDER — DSS 100 MG PO CAPS: 100.0000 mg | ORAL_CAPSULE | Freq: Two times a day (BID) | ORAL | Status: DC

## 2013-01-03 MED ORDER — BISACODYL 10 MG RE SUPP
10.0000 mg | Freq: Once | RECTAL | Status: AC
  Administered 2013-01-03: 10 mg via RECTAL
  Filled 2013-01-03: qty 1

## 2013-01-03 MED ORDER — LACOSAMIDE 200 MG PO TABS: 200.0000 mg | ORAL_TABLET | Freq: Two times a day (BID) | ORAL | Status: DC

## 2013-01-03 MED ORDER — LEVETIRACETAM 1000 MG PO TABS: 1000.0000 mg | ORAL_TABLET | Freq: Two times a day (BID) | ORAL | Status: DC

## 2013-01-03 MED ORDER — PHENYTOIN 50 MG PO CHEW
100.0000 mg | CHEWABLE_TABLET | Freq: Every day | ORAL | Status: DC
  Administered 2013-01-04: 100 mg via ORAL
  Filled 2013-01-03: qty 2

## 2013-01-03 MED ORDER — PHENYTOIN 50 MG PO CHEW: 100.0000 mg | CHEWABLE_TABLET | Freq: Every day | ORAL | Status: DC

## 2013-01-03 MED ORDER — LACOSAMIDE 200 MG PO TABS: 100.0000 mg | ORAL_TABLET | Freq: Two times a day (BID) | ORAL | Status: DC

## 2013-01-03 MED ORDER — LACOSAMIDE 200 MG PO TABS
100.0000 mg | ORAL_TABLET | Freq: Two times a day (BID) | ORAL | Status: DC
  Administered 2013-01-03 – 2013-01-04 (×2): 100 mg via ORAL
  Filled 2013-01-03 (×2): qty 1

## 2013-01-03 MED ORDER — CARVEDILOL 3.125 MG PO TABS: 3.1250 mg | ORAL_TABLET | Freq: Two times a day (BID) | ORAL | Status: DC

## 2013-01-03 NOTE — Progress Notes (Addendum)
Patient likely not have 24/7 care at home, Await discussion with daughter. Likely SNF. I will follow up with daughter today. [phone] I just spoke with daughter by phone, she will discuss with her brother and call me back today. [phone]

## 2013-01-03 NOTE — Care Management Note (Signed)
   CARE MANAGEMENT NOTE 01/03/2013  Patient:  [name], [name]   Account Number:  [id number]  Date Initiated:  12/28/2012  Documentation initiated by:  Junius Creamer  Subjective/Objective Assessment:   adm w seizure     Action/Plan:   lives alone  01/03/2013 Plan for CIR vs SNF for short term rehab, still awaiting insurance approval.   Anticipated DC Date:     Anticipated DC Plan:  IP REHAB FACILITY         Choice offered to / List presented to:             Status of service:   Medicare Important Message given?   (If response is "NO", the following Medicare IM given date fields will be blank) Date Medicare IM given:   Date Additional Medicare IM given:    Discharge Disposition:    Per UR Regulation:  Reviewed for med. necessity/level of care/duration of stay  If discussed at Long Length of Stay Meetings, dates discussed:    Comments:  01/01/2013  [address] RN, [place] [phone] Patient evaluated by CIR.

## 2013-01-03 NOTE — Discharge Summary (Addendum)
Physician Discharge Summary  Lyrica Mcclarty UJW:[id number] DOB: [birth year] DOA: 12/28/2012  PCP: Default, Provider, MD  Admit date: 12/28/2012 Discharge date: 01/03/2013  Time spent: >35 minutes  Recommendations for Outpatient Follow-up:  F/u with cardiology next week F/u with neurologist in 1-2 weeks F/u with PCP as needed in 1 week   Discharge Diagnoses:  Active Problems:   Generalized seizure   Hypertension   Seizure   Metabolic acidosis   Encephalopathy   Acute renal failure   Discharge Condition: stable   Diet recommendation: heart healthy   Filed Weights   12/31/12 2109 01/01/13 2110 01/02/13 2030  Weight: 41.459 kg (91 lb 6.4 oz) 41.459 kg (91 lb 6.4 oz) 43.591 kg (96 lb 1.6 oz)    History of present illness:  72 y.o. female with PMH significant for hypertension, MI who was brought to ED by family member with AMS. History obtain from family. Patient unable to provide history due to AMS. Patient become confuse, not following command, weak. On her way to ED friend witness generalize muscle movement.   Hospital Course:  1. 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 EEG: irregular left parietal-occipital delta activity, Left parieto-occipital sharp waves. On keppra, dilantin, Vimpat. Neurology following. decrease the dose due to drowsiness;  F/u with neurologist outpatient in 1-2 week for further dose adjustment if needed  2. 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 No need for TEE per neurology recommendation. Carotid doppler: Preliminary report: 1-39% ICA stenosis. Vertebral artery flow is antegrade. LDL 40, Hb A-1c; 5.2  -Continue with aspirin. need rehab, SW involved   3. Hypertension: Continue with coreg. Will consider start low dose lisinopril if renal function allows it.  4. Metabolic acidosis. Lactic acid was normal. Resolved.  5. Abdominal Pain: consi. Continue with protonix. KUB  11-29 with Mildly prominent loops of colon in the right lower quadrant, possibly reflecting adynamic colonic ileus. lipase normal. Lactic acid normal.  Tolerating diet. Ambulate; Started laxatives, titrate as needed; exam no s/s of acute abdomen;   6. Acute renal insufficiency: improved  7. Encephalopathy/AMS; in setting of stroke and seizures. Improving.  8. Sinus tachycardia; HR 100. Monitor. TSH at 0.83. coreg due to cardiomyopathy.  10. Cardiomyopathy EF 35 to 40%, last Ef per Cath 2010 was normal at 55 %. Continue with coreg. Clinically euvolemic; CXR no edema; no diuretics at this time; no ACE due to AKI:  no chest pain; f/u Dr Eldridge Dace in 1 week.   Procedures:  CT head (i.e. Studies not automatically included, echos, thoracentesis, etc; not x-rays)  Consultations:  neurology  Discharge Exam: Filed Vitals:   01/03/13 1000  BP: 126/89  Pulse: 109  Temp: 98.4 F (36.9 C)  Resp: 18    General: alert, confused at baseline  Cardiovascular: s1,s2 rrr Respiratory: CTA BL  Discharge Instructions  Discharge Orders   Future Orders Complete By Expires   Diet - low sodium heart healthy  As directed    Discharge instructions  As directed    Comments:     Please follow up with neurologist in 1-2 weeks Please follow up with cardiologist next week   Increase activity slowly  As directed        Medication List         alendronate 70 MG tablet  Commonly known as:  FOSAMAX  Take 70 mg by mouth once a week. Take with a full glass of  water on an empty stomach.     amLODipine 10 MG tablet  Commonly known as:  NORVASC  Take 10 mg by mouth daily.     aspirin 325 MG tablet  Take 325 mg by mouth daily.     carvedilol 3.125 MG tablet  Commonly known as:  COREG  Take 1 tablet (3.125 mg total) by mouth 2 (two) times daily with a meal.     DSS 100 MG Caps  Take 100 mg by mouth 2 (two) times daily.     lacosamide 200 MG Tabs tablet  Commonly known as:  VIMPAT  Take 0.5  tablets (100 mg total) by mouth 2 (two) times daily.     levETIRAcetam 1000 MG tablet  Commonly known as:  KEPPRA  Take 1 tablet (1,000 mg total) by mouth 2 (two) times daily.     phenytoin 50 MG tablet  Commonly known as:  DILANTIN  Chew 2 tablets (100 mg total) by mouth daily.     polyethylene glycol packet  Commonly known as:  MIRALAX / GLYCOLAX  Take 17 g by mouth daily.     senna 8.6 MG Tabs tablet  Commonly known as:  SENOKOT  Take 1 tablet (8.6 mg total) by mouth daily.       No Known Allergies     Follow-up Information   Follow up with Corky Crafts., MD In 1 week.   Specialty:  Cardiology   Contact information:   [address] [place] [address] [phone]       Follow up with Default, Provider, MD In 1 week.   Contact information:   [address] [place] [address] [phone]       Follow up with Amada Jupiter, MCNEILL, MD In 1 week.   Specialty:  Neurology   Contact information:   [address] [place] [address] [phone]        The results of significant diagnostics from this hospitalization (including imaging, microbiology, ancillary and laboratory) are listed below for reference.    Significant Diagnostic Studies: Dg Abd 1 View  01/01/2013   CLINICAL DATA:  Abdominal pain.  Constipation.  EXAM: ABDOMEN - 1 VIEW  COMPARISON:  Radiograph dated 12/29/2012  FINDINGS: There is air throughout the colon with some stool in the ascending and hepatic flexure regions. There is no fecal impaction. No dilated small bowel.  Osseous structures are normal.  IMPRESSION: Prominent gas in the nondistended colon.  No fecal impaction.   Electronically Signed   By: Geanie Cooley M.D.   On: 01/01/2013 18:32   Ct Head Wo Contrast  12/28/2012   CLINICAL DATA:  Seizures.  EXAM: CT HEAD WITHOUT CONTRAST  TECHNIQUE: Contiguous axial images were obtained from the base of the skull through the vertex without intravenous contrast.   COMPARISON:  CT head and MRI brain 10/31/2005  FINDINGS: Mild cerebral atrophy. No ventricular dilatation. Patchy low-attenuation changes in the deep white matter consistent with small vessel ischemia. No mass effect or midline shift. No abnormal extra-axial fluid collections. Gray-white matter junctions are distinct. Basal cisterns are not effaced. No evidence of acute intracranial hemorrhage. No depressed skull fractures. Opacification of the left sphenoid sinus, similar to previous study. Vascular calcifications. No significant changes since prior study.  IMPRESSION: No acute intracranial abnormalities. Mild chronic atrophy and small vessel ischemic change. Opacification of sphenoid sinus.   Electronically Signed   By: Burman Nieves M.D.   On: 12/28/2012 03:17  Mr Brain Wo Contrast  12/28/2012   CLINICAL DATA:  Seizure.  EXAM: MRI HEAD WITHOUT CONTRAST  TECHNIQUE: Multiplanar, multiecho pulse sequences of the brain and surrounding structures were obtained without intravenous contrast.  COMPARISON:  Head CT 12/28/2012 and brain MRI 10/31/2005  FINDINGS: Images are moderately degraded by motion artifact. There is a 1.1 cm focus of restricted diffusion involving the right basal ganglia, consistent with acute infarct. There is no other evidence of acute infarct. Patchy T2 hyperintensities within the subcortical and deep cerebral white matter have increased from the prior exam and are compatible with moderate chronic small vessel ischemic disease. Focus of susceptibility artifact in the posterior left cingulate gyrus/left splenium of the corpus callosum appears new from the prior study and is compatible with interval, nonacute hemorrhage. Dedicated thin section imaging of the medial temporal lobes is limited by motion artifact without gross the hippocampal abnormality identified. There is no evidence of mass, midline shift, or extra-axial fluid collection. Major intracranial flow voids are unremarkable. Prior  left cataract surgery is noted. Visualized paranasal sinuses are clear.  IMPRESSION: 1. 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 2. Moderate chronic small vessel ischemic disease, increased from prior. 3. Interval, chronic hemorrhage involving the posterior left cingulate gyrus/left splenium of the corpus callosum.   Electronically Signed   By: Sebastian Ache   On: 12/28/2012 10:23   Dg Chest Port 1 View  12/28/2012   CLINICAL DATA:  Seizures.  EXAM: PORTABLE CHEST - 1 VIEW  COMPARISON:  04/16/2012  FINDINGS: Mild cardiac enlargement with normal pulmonary vascularity. Linear atelectasis in the lung bases. No focal consolidation. No blunting of costophrenic angles. No pneumothorax. Old right rib fracture.  IMPRESSION: Mild cardiac enlargement.  Atelectasis in the lung bases.   Electronically Signed   By: Burman Nieves M.D.   On: 12/28/2012 02:45   Dg Abd Portable 1v  12/29/2012   CLINICAL DATA:  Abdominal pain, epigastric tenderness  EXAM: PORTABLE ABDOMEN - 1 VIEW  COMPARISON:  CT abdomen pelvis dated 04/16/2012  FINDINGS: Mildly prominent loops of gas and stool fluid colon in the right lower abdomen, possibly reflecting adynamic colonic ileus, but nonobstructive.  Mild lumbar levoscoliosis.  IMPRESSION: Mildly prominent loops of colon in the right lower quadrant, possibly reflecting adynamic colonic ileus.  No evidence of bowel obstruction.   Electronically Signed   By: Charline Bills M.D.   On: 12/29/2012 15:39    Microbiology: Recent Results (from the past 240 hour(s))  MRSA PCR SCREENING     Status: None   Collection Time    12/28/12  1:43 PM      Result Value Range Status   MRSA by PCR NEGATIVE  NEGATIVE Final   Comment:            The GeneXpert MRSA Assay (FDA     approved for NASAL specimens     only), is one component of a     comprehensive MRSA colonization     surveillance program. It is not     intended to diagnose MRSA     infection nor to guide or     monitor treatment for      MRSA infections.     Labs: Basic Metabolic Panel:  Recent Labs Lab 12/29/12 0430 12/30/12 0530 12/31/12 0800 01/01/13 0530 01/02/13 0535  NA 135 136 137 137 139  K 4.6 4.3 3.9 4.1 4.1  CL 102 103 98 100 105  CO2 18* 17* 22 24 22  GLUCOSE 106* 72 109* 105* 99  BUN 15 17 14 22 21   CREATININE 1.11* 1.34* 1.04 1.48* 1.29*  CALCIUM 8.6 8.8 9.5 8.9 8.5   Liver Function Tests:  Recent Labs Lab 12/28/12 0217 12/29/12 0430  AST 28 20  ALT 18 11  ALKPHOS 95 72  BILITOT 0.5 0.7  PROT 8.4* 6.9  ALBUMIN 4.3 3.2*    Recent Labs Lab 12/29/12 0900  LIPASE 18   No results found for this basename: AMMONIA,  in the last 168 hours CBC:  Recent Labs Lab 12/28/12 0155 12/28/12 0217 12/29/12 0430 12/30/12 0530 01/02/13 0535  WBC  --  8.9  10.4 7.6 5.9 7.4  NEUTROABS  --  4.4  --   --   --   HGB 16.7* 13.5  14.4 13.3 11.9* 12.2  HCT 49.0* 40.7  43.2 39.4 35.6* 36.1  MCV  --  99.5  101.4* 96.8 96.7 96.3  PLT  --  284  281 225 216 224   Cardiac Enzymes:  Recent Labs Lab 12/29/12 0430  TROPONINI <0.30   BNP: BNP (last 3 results) No results found for this basename: PROBNP,  in the last 8760 hours CBG: No results found for this basename: GLUCAP,  in the last 168 hours     Signed:  Jonette Mate N  [facility] 01/03/2013, 11:06 AM

## 2013-01-03 NOTE — Progress Notes (Signed)
Physical Therapy Treatment Patient Details Name: [name] MRN: [id number] DOB: Mar 31, 1940 Today's Date: 01/03/2013 Time: 4098-1191 PT Time Calculation (min): 16 min  PT Assessment / Plan / Recommendation  History of Present Illness pt limited by back and abdominal pain today   PT Comments   Pt highly motivated to return home. Was agreeable to therapy. Able to transfer to chair with max (A) and max cueing. Pt with constant lateral lean to left; correctable with tactile and verbal cues. Continues to be great candidate for CIR. Son present and providing great support.   Follow Up Recommendations  CIR;Supervision/Assistance - 24 hour     Does the patient have the potential to tolerate intense rehabilitation     Barriers to Discharge        Equipment Recommendations  Rolling walker with 5" wheels    Recommendations for Other Services OT consult;Rehab consult  Frequency Min 4X/week   Progress towards PT Goals Progress towards PT goals: Progressing toward goals  Plan Current plan remains appropriate    Precautions / Restrictions Precautions Precautions: Fall Precaution Comments: right hemiparesis Restrictions Weight Bearing Restrictions: No   Pertinent Vitals/Pain C/o pain in back; did not rate pain. patient repositioned for comfort     Mobility  Bed Mobility Bed Mobility: Supine to Sit;Sitting - Scoot to Edge of Bed Supine to Sit: 2: Max assist;HOB elevated;With rails Sitting - Scoot to Edge of Bed: 2: Max assist Details for Bed Mobility Assistance: use of draw pad to bring hips and trunk to sitting position at EOB; pt able to minimaly advance LEs to EOB; required (A) to bring all the way to floor; max cues for sequencing  Transfers Transfers: Sit to Stand;Stand to Sit;Stand Pivot Transfers Sit to Stand: 2: Max assist;From bed;From elevated surface Stand to Sit: 2: Max assist;To chair/3-in-1;With upper extremity assist;With armrests Stand Pivot Transfers: 2: Max  assist;From elevated surface Details for Transfer Assistance: required blocking of bil knees to maintain balance; pt was able to WB through LEs minimally; required (A) to maintain balance and facilitate weightshift into chair; pt attempted to sit prior to reaching chair; requires max cues for sequencing  Ambulation/Gait Ambulation/Gait Assistance: Not tested (comment) Stairs: No Wheelchair Mobility Wheelchair Mobility: No Modified Rankin (Stroke Patients Only) Pre-Morbid Rankin Score: No symptoms Modified Rankin: Severe disability         PT Diagnosis:    PT Problem List:   PT Treatment Interventions:     PT Goals (current goals can now be found in the care plan section) Acute Rehab PT Goals Patient Stated Goal: go home PT Goal Formulation: With patient/family Time For Goal Achievement: 01/13/13 Potential to Achieve Goals: Fair  Visit Information  Last PT Received On: 01/03/13 Assistance Needed: +2 (for ambulation) History of Present Illness: pt limited by back and abdominal pain today    Subjective Data  Subjective: pt lying supine with son and famiy present; pt agreeable to perform OOB activities. states "i want to go home"  Patient Stated Goal: go home   Cognition  Cognition Arousal/Alertness: Awake/alert Behavior During Therapy: WFL for tasks assessed/performed Overall Cognitive Status: Impaired/Different from baseline Area of Impairment: Safety/judgement;Problem solving;Attention Orientation Level: Disoriented to;Time;Situation Safety/Judgement: Decreased awareness of deficits;Decreased awareness of safety Problem Solving: Slow processing;Decreased initiation;Difficulty sequencing;Requires verbal cues;Requires tactile cues    Balance  Balance Balance Assessed: Yes Static Sitting Balance Static Sitting - Balance Support: Bilateral upper extremity supported;Feet supported Static Sitting - Level of Assistance: 5: Stand by assistance;4: Min assist Static Sitting -  Comment/# of Minutes: at times pt requires (A) to return to midline; leans to left; tolerated sitting ~ 6 min prior to transfer   End of Session PT - End of Session Equipment Utilized During Treatment: Gait belt Activity Tolerance: Patient tolerated treatment well Patient left: in chair;with call bell/phone within reach;with family/visitor present Nurse Communication: Mobility status   GP     Donell Sievert, [place] [phone] 01/03/2013, 3:50 PM

## 2013-01-03 NOTE — Progress Notes (Addendum)
I met with pt' son, [name], at bedside. He plans for pt to come to live with he and his family once discharged from inpt rehab. I will begin insurance approval with [employer] medicare for a possible admission tomorrow. I have updated SW. [phone]

## 2013-01-04 ENCOUNTER — Inpatient Hospital Stay (HOSPITAL_COMMUNITY): Payer: Medicare Other

## 2013-01-04 ENCOUNTER — Inpatient Hospital Stay (HOSPITAL_COMMUNITY)
Admission: RE | Admit: 2013-01-04 | Discharge: 2013-01-15 | DRG: 945 | Disposition: A | Discharge status: Home Health | Payer: Medicare Other | Source: Intra-hospital | Attending: Physical Medicine & Rehabilitation | Admitting: Physical Medicine & Rehabilitation

## 2013-01-04 DIAGNOSIS — K5900 Constipation, unspecified: Secondary | ICD-10-CM | POA: Diagnosis present

## 2013-01-04 DIAGNOSIS — I6330 Cerebral infarction due to thrombosis of unspecified cerebral artery: Secondary | ICD-10-CM | POA: Diagnosis present

## 2013-01-04 DIAGNOSIS — I252 Old myocardial infarction: Secondary | ICD-10-CM

## 2013-01-04 DIAGNOSIS — K560 Paralytic ileus: Secondary | ICD-10-CM | POA: Diagnosis present

## 2013-01-04 DIAGNOSIS — I1310 Hypertensive heart and chronic kidney disease without heart failure, with stage 1 through stage 4 chronic kidney disease, or unspecified chronic kidney disease: Secondary | ICD-10-CM | POA: Diagnosis present

## 2013-01-04 DIAGNOSIS — R4182 Altered mental status, unspecified: Secondary | ICD-10-CM | POA: Diagnosis present

## 2013-01-04 DIAGNOSIS — R5381 Other malaise: Secondary | ICD-10-CM | POA: Diagnosis present

## 2013-01-04 DIAGNOSIS — Z7982 Long term (current) use of aspirin: Secondary | ICD-10-CM

## 2013-01-04 DIAGNOSIS — R569 Unspecified convulsions: Secondary | ICD-10-CM | POA: Diagnosis present

## 2013-01-04 DIAGNOSIS — R64 Cachexia: Secondary | ICD-10-CM | POA: Diagnosis present

## 2013-01-04 DIAGNOSIS — Z5189 Encounter for other specified aftercare: Principal | ICD-10-CM

## 2013-01-04 DIAGNOSIS — I639 Cerebral infarction, unspecified: Secondary | ICD-10-CM

## 2013-01-04 DIAGNOSIS — I10 Essential (primary) hypertension: Secondary | ICD-10-CM | POA: Diagnosis present

## 2013-01-04 DIAGNOSIS — N179 Acute kidney failure, unspecified: Secondary | ICD-10-CM | POA: Diagnosis present

## 2013-01-04 DIAGNOSIS — Z8673 Personal history of transient ischemic attack (TIA), and cerebral infarction without residual deficits: Secondary | ICD-10-CM

## 2013-01-04 DIAGNOSIS — G40909 Epilepsy, unspecified, not intractable, without status epilepticus: Secondary | ICD-10-CM | POA: Diagnosis present

## 2013-01-04 HISTORY — DX: Personal history of transient ischemic attack (TIA), and cerebral infarction without residual deficits: Z86.73

## 2013-01-04 LAB — CREATININE, SERUM
Creatinine, Ser: 1.2 mg/dL — ABNORMAL HIGH (ref 0.50–1.10)
GFR calc Af Amer: 52 mL/min — ABNORMAL LOW (ref >90)
GFR calc non Af Amer: 45 mL/min — ABNORMAL LOW (ref >90)

## 2013-01-04 MED ORDER — PANTOPRAZOLE SODIUM 40 MG PO TBEC
40.0000 mg | DELAYED_RELEASE_TABLET | Freq: Every day | ORAL | Status: DC
  Administered 2013-01-05 – 2013-01-15 (×10): 40 mg via ORAL
  Filled 2013-01-04 (×11): qty 1

## 2013-01-04 MED ORDER — ASPIRIN EC 325 MG PO TBEC
325.0000 mg | DELAYED_RELEASE_TABLET | Freq: Every day | ORAL | Status: DC
  Administered 2013-01-05 – 2013-01-15 (×11): 325 mg via ORAL
  Filled 2013-01-04 (×12): qty 1

## 2013-01-04 MED ORDER — CARVEDILOL 3.125 MG PO TABS
3.1250 mg | ORAL_TABLET | Freq: Two times a day (BID) | ORAL | Status: DC
  Administered 2013-01-04 – 2013-01-15 (×22): 3.125 mg via ORAL
  Filled 2013-01-04 (×24): qty 1

## 2013-01-04 MED ORDER — LEVETIRACETAM 500 MG PO TABS
1000.0000 mg | ORAL_TABLET | Freq: Two times a day (BID) | ORAL | Status: DC
  Administered 2013-01-04 – 2013-01-15 (×22): 1000 mg via ORAL
  Filled 2013-01-04 (×24): qty 2

## 2013-01-04 MED ORDER — LEVOFLOXACIN 500 MG PO TABS
500.0000 mg | ORAL_TABLET | ORAL | Status: DC
  Administered 2013-01-04 – 2013-01-08 (×5): 500 mg via ORAL
  Filled 2013-01-04 (×6): qty 1

## 2013-01-04 MED ORDER — GUAIFENESIN-DM 100-10 MG/5ML PO SYRP: 5.0000 mL | ORAL_SOLUTION | Freq: Four times a day (QID) | ORAL | Status: DC | PRN

## 2013-01-04 MED ORDER — TRAZODONE HCL 50 MG PO TABS
25.0000 mg | ORAL_TABLET | Freq: Every evening | ORAL | Status: DC | PRN
  Administered 2013-01-04: 25 mg via ORAL
  Administered 2013-01-05: 50 mg via ORAL
  Filled 2013-01-04 (×2): qty 1

## 2013-01-04 MED ORDER — ACETAMINOPHEN 325 MG PO TABS
325.0000 mg | ORAL_TABLET | ORAL | Status: DC | PRN
  Administered 2013-01-04 – 2013-01-07 (×3): 650 mg via ORAL
  Filled 2013-01-04 (×5): qty 2

## 2013-01-04 MED ORDER — ENOXAPARIN SODIUM 30 MG/0.3ML ~~LOC~~ SOLN
30.0000 mg | SUBCUTANEOUS | Status: DC
  Administered 2013-01-05 – 2013-01-14 (×10): 30 mg via SUBCUTANEOUS
  Filled 2013-01-04 (×11): qty 0.3

## 2013-01-04 MED ORDER — ONDANSETRON HCL 4 MG/2ML IJ SOLN: 4.0000 mg | Freq: Four times a day (QID) | INTRAMUSCULAR | Status: DC | PRN

## 2013-01-04 MED ORDER — POLYETHYLENE GLYCOL 3350 17 G PO PACK
17.0000 g | PACK | Freq: Two times a day (BID) | ORAL | Status: DC
  Administered 2013-01-04 – 2013-01-15 (×22): 17 g via ORAL
  Filled 2013-01-04 (×25): qty 1

## 2013-01-04 MED ORDER — BISACODYL 10 MG RE SUPP
10.0000 mg | Freq: Every day | RECTAL | Status: DC | PRN
  Administered 2013-01-05: 10 mg via RECTAL
  Filled 2013-01-04: qty 1

## 2013-01-04 MED ORDER — ALUM & MAG HYDROXIDE-SIMETH 200-200-20 MG/5ML PO SUSP
30.0000 mL | ORAL | Status: DC | PRN
  Filled 2013-01-04: qty 30

## 2013-01-04 MED ORDER — ONDANSETRON HCL 4 MG PO TABS: 4.0000 mg | ORAL_TABLET | Freq: Four times a day (QID) | ORAL | Status: DC | PRN

## 2013-01-04 MED ORDER — LACOSAMIDE 50 MG PO TABS
100.0000 mg | ORAL_TABLET | Freq: Two times a day (BID) | ORAL | Status: DC
  Administered 2013-01-04 – 2013-01-15 (×22): 100 mg via ORAL
  Filled 2013-01-04 (×24): qty 2

## 2013-01-04 MED ORDER — PHENYTOIN 50 MG PO CHEW
100.0000 mg | CHEWABLE_TABLET | Freq: Every day | ORAL | Status: DC
  Administered 2013-01-05 – 2013-01-09 (×5): 100 mg via ORAL
  Filled 2013-01-04 (×6): qty 2

## 2013-01-04 MED ORDER — METHOCARBAMOL 500 MG PO TABS
500.0000 mg | ORAL_TABLET | Freq: Four times a day (QID) | ORAL | Status: DC | PRN
  Administered 2013-01-10: 500 mg via ORAL
  Filled 2013-01-04: qty 1

## 2013-01-04 MED ORDER — FLEET ENEMA 7-19 GM/118ML RE ENEM: 1.0000 | ENEMA | Freq: Once | RECTAL | Status: AC | PRN

## 2013-01-04 NOTE — Progress Notes (Signed)
Pt transferred to Rehab from 6E-24. Pt and family orientated to unit. Diagnostic specific information provided. Rehab therapy schedule and expectations explained.

## 2013-01-04 NOTE — Progress Notes (Signed)
I have insurance approval to admit pt to inpt rehab today. I have contacted Dr. York Spaniel and will arrange. [phone]

## 2013-01-04 NOTE — H&P (View-Only) (Signed)
Physical Medicine and Rehabilitation Admission H&P    Chief Complaint  Patient presents with  . Seizures   HPI: [name] is a 72 y.o. right-handed female with history of CVA, hypertension as well as myocardial infarction maintained on aspirin therapy. Independent prior to admission living alone and driving. Admitted 12/28/2012 with altered mental status as well as seizure. MRI of the brain showed acute 1 cm right basal ganglia infarction as well as chronic hemorrhage left temporal region. Echocardiogram with ejection fraction of 40% with diffuse hypokinesis. Carotid Dopplers with no ICA stenosis. EEG consistent with a potential area for epileptogenicity in the left parietal-occipital region. Patient loaded with Keppra/Dilantin as well as Vimpat.  She has had issues with ileus and constipation --no results with enema. Dilantin dose decreased due to elevated levels.  Patient with right sided weakness with balance deficits as well as BLE instability. Therapy reports coginitive deficits with decreased memory and delayed processing.   ROS  Past Medical History  Diagnosis Date  . MI (myocardial infarction)   . Hypertension    History reviewed. No pertinent past surgical history.  No family history on file.  Social History: Lives alone but son plans on moving in past discharge. reports that she has never smoked. She does not have any smokeless tobacco history on file. She reports that she does not drink alcohol or use illicit drugs.  Allergies: No Known Allergies  Medications Prior to Admission  Medication Sig Dispense Refill  . alendronate (FOSAMAX) 70 MG tablet Take 70 mg by mouth once a week. Take with a full glass of water on an empty stomach.      Marland Kitchen amLODipine (NORVASC) 10 MG tablet Take 10 mg by mouth daily.      Marland Kitchen aspirin 325 MG tablet Take 325 mg by mouth daily.        Home: Home Living Family/patient expects to be discharged to:: Private residence Living Arrangements:  Alone Available Help at Discharge: Family;Available 24 hours/day Type of Home: House Home Layout: One level Home Equipment: None   Functional History:    Functional Status:  Mobility: Bed Mobility Bed Mobility: Supine to Sit;Sitting - Scoot to Edge of Bed Rolling Right: 3: Mod assist;With rail Rolling Left: 3: Mod assist;With rail Left Sidelying to Sit: 3: Mod assist;HOB elevated;With rails Supine to Sit: 2: Max assist;HOB elevated;With rails Sitting - Scoot to Edge of Bed: 2: Max assist Transfers Transfers: Sit to Stand;Stand to [employer] Transfers Sit to Stand: 2: Max assist;From bed;From elevated surface Stand to Sit: 2: Max assist;To chair/3-in-1;With upper extremity assist;With armrests Stand Pivot Transfers: 2: Max assist;From elevated surface Ambulation/Gait Ambulation/Gait Assistance: Not tested (comment) Ambulation/Gait: Patient Percentage: 60% Ambulation Distance (Feet): 4 Feet Assistive device: 2 person hand held assist Ambulation/Gait Assistance Details: pt able to advance LEs minimally but with Rt LE continuously buckling; pt with narrow BOS and requires 2 person (A) to maintain balance and (A) facilitating weightshift. pt returned to bed and then performed SPT to chair for safety Gait Pattern: Right flexed knee in stance;Narrow base of support;Trunk flexed;Shuffle;Decreased hip/knee flexion - right;Decreased stride length Gait velocity: very decreased  Stairs: No Wheelchair Mobility Wheelchair Mobility: No  ADL: ADL Grooming: Performed;Wash/dry hands;Wash/dry face;Minimal assistance Where Assessed - Grooming: Supported sitting Upper Body Bathing: Moderate assistance;Maximal assistance Where Assessed - Upper Body Bathing: Supported sitting Lower Body Bathing: +1 Total assistance Where Assessed - Lower Body Bathing: Supported sitting Upper Body Dressing: Performed;Moderate assistance;Maximal assistance Lower Body Dressing: +1 Total assistance Toilet  Transfer: Performed;Moderate assistance Toilet Transfer Method: Sit to Barista: Bedside commode Tub/Shower Transfer Method: Not assessed Equipment Used: Gait belt Transfers/Ambulation Related to ADLs: pt required increased time to initiate sit - stand due to c/o abdominal pain, difficulty with upright standing positon due to R side weakness/decreased balance ADL Comments: pt reluctant o initiate ADLs, slow processing  Cognition: Cognition Overall Cognitive Status: Impaired/Different from baseline Orientation Level: Oriented to person;Disoriented to situation;Disoriented to place;Oriented to place Cognition Arousal/Alertness: Awake/alert Behavior During Therapy: WFL for tasks assessed/performed Overall Cognitive Status: Impaired/Different from baseline Area of Impairment: Safety/judgement;Problem solving;Attention Orientation Level: Disoriented to;Time;Situation Memory: Decreased short-term memory;Decreased recall of precautions Following Commands: Follows one step commands with increased time;Follows one step commands consistently Safety/Judgement: Decreased awareness of deficits;Decreased awareness of safety Problem Solving: Slow processing;Decreased initiation;Difficulty sequencing;Requires verbal cues;Requires tactile cues General Comments: pt attempted to participate with PT, lilmited by back pain and kept eyes closed during treatment  Physical Exam: Blood pressure 132/86, pulse 105, temperature 98.2 F (36.8 C), temperature source Oral, resp. rate 18, height 5' (1.524 m), weight 43.046 kg (94 lb 14.4 oz), SpO2 99.00%. Physical Exam  Nursing note and vitals reviewed. Constitutional: She appears listless. She appears cachectic. She is cooperative. No distress.  Frail appearing   HENT:  Head: Normocephalic and atraumatic.  Eyes: Conjunctivae are normal. Pupils are equal, round, and reactive to light.  Neck: Normal range of motion. No JVD present. No  tracheal deviation present. No thyromegaly present.  Cardiovascular: Normal rate, regular rhythm, S1 normal and S2 normal.  Exam reveals gallop.   Respiratory: No respiratory distress. She has decreased breath sounds in the left lower field. She has no wheezes. She has no rales.  Splinting with deep breathing--holding left side.   GI: Soft. Bowel sounds are normal. She exhibits no distension. There is no tenderness. There is no rebound.  Musculoskeletal: Normal range of motion. She exhibits no edema.  Lymphadenopathy:    She has no cervical adenopathy.  Neurological: She appears listless. She exhibits normal muscle tone.  Mild left central 7, minimal tongue deviation. Left delt, bic, tricep 4-, wrist/hand 4. Decreased FMC and mild ataxia of LUE. LLE 2HF, 3-KE, ankle 3+. RUE 4 to 4+, RLE 3+ prox to 4+ distally. No gross sensory findings. Has reasonable insight and awareness.   Skin: Skin is warm and dry. She is not diaphoretic.  Psychiatric: She has a normal mood and affect. Her behavior is normal. Judgment and thought content normal.    Results for orders placed during the hospital encounter of 12/28/12 (from the past 48 hour(s))  CREATININE, SERUM     Status: Abnormal   Collection Time    01/04/13  5:40 AM      Result Value Range   Creatinine, Ser 1.20 (*) 0.50 - 1.10 mg/dL   GFR calc non Af Amer 45 (*) >90 mL/min   GFR calc Af Amer 52 (*) >90 mL/min   Comment: (NOTE)     The eGFR has been calculated using the CKD EPI equation.     This calculation has not been validated in all clinical situations.     eGFR's persistently <90 mL/min signify possible Chronic Kidney     Disease.   No results found.  Post Admission Physician Evaluation: 1. Functional deficits secondary  to thrombotic right basal ganglia infarct 2. Patient is admitted to receive collaborative, interdisciplinary care between the physiatrist, rehab nursing staff, and therapy team. 3. Patient's level of medical complexity  and  substantial therapy needs in context of that medical necessity cannot be provided at a lesser intensity of care such as a SNF. 4. Patient has experienced substantial functional loss from his/her baseline which was documented above under the "Functional History" and "Functional Status" headings.  Judging by the patient's diagnosis, physical exam, and functional history, the patient has potential for functional progress which will result in measurable gains while on inpatient rehab.  These gains will be of substantial and practical use upon discharge  in facilitating mobility and self-care at the household level. 5. Physiatrist will provide 24 hour management of medical needs as well as oversight of the therapy plan/treatment and provide guidance as appropriate regarding the interaction of the two. 6. 24 hour rehab nursing will assist with bladder management, bowel management, safety, skin/wound care, disease management, medication administration, pain management and patient education  and help integrate therapy concepts, techniques,education, etc. 7. PT will assess and treat for/with: Lower extremity strength, range of motion, stamina, balance, functional mobility, safety, adaptive techniques and equipment, NMR, education.   Goals are: supervision to minimal assist. 8. OT will assess and treat for/with: ADL's, functional mobility, safety, upper extremity strength, adaptive techniques and equipment, NMR, education, stamina.   Goals are: supervision to min assist. 9. SLP will assess and treat for/with: n/a.  Goals are: n/a. 10. Case Management and Social Worker will assess and treat for psychological issues and discharge planning. 11. Team conference will be held weekly to assess progress toward goals and to determine barriers to discharge. 12. Patient will receive at least 3 hours of therapy per day at least 5 days per week. 13. ELOS: 18- 24 days      14. Prognosis:  good   Medical Problem List and  Plan: Right basal ganglia infarct and new onset of seizures 1. DVT Prophylaxis/Anticoagulation: Pharmaceutical: Lovenox 2. Pain Management: complains of left UQ pain, rib pain. ?recent fall  -utilize heating pad  -prn tylenol  -observe for now  -needs bm 3. Mood: pt flat, but this largely due to fatigue, meds, medical issues, etc. Team to provide ego support as possible to pt. Seems pretty motivated, and is feeling "more like herself" today. 4. Neuropsych: This patient is capable of making decisions on her own behalf. 5. New onset seizure: Continue Keppra, dilantin, and vimpat. Recheck dilantin level next week.  6. HTN:  Will monitor with bid checks.  7. Constipation: Will set bowel program. Repeat enema today.   Ranelle Oyster, MD, [facility] Physical Medicine & Rehabilitation   01/04/2013

## 2013-01-04 NOTE — PMR Pre-admission (Signed)
PMR Admission Coordinator Pre-Admission Assessment  Patient: [name] is an 72 y.o., female MRN: [id number] DOB: 03-05-1940 Height: 5' (152.4 cm) Weight: 43.046 kg (94 lb 14.4 oz)              Insurance Information HMO:     PPO: yes     PCP:      IPA:      80/20:      OTHER:  PRIMARY: [employer] medicare      Policy#: [id number]      Subscriber: pt CM Name: Oretha Milch      Phone#: [phone]     Fax#: [phone] Pre-Cert#: [id number]      Employer: retired Benefits:  Phone #: [phone]     Name: 12/4 Eff. Date: 07/31/12     Deduct: none      Out of Pocket Max: $4500      Life Max: none CIR: $295 per day days 1 to 5      SNF: $25 per day days 1-20; $152 per day days 21-47; no copay days 48-100 Outpatient: $40 copay per visit     Co-Pay: no visit limit Home Health: 100%      Co-Pay: none DME: 80%     Co-Pay: 20% Providers: in network  SECONDARY: none        Emergency Contact Information Contact Information   Name Relation Home Work Mobile   [place] Son   [phone]   Heckert,Latoya Daughter  [phone]     Current Medical History  Patient Admitting Diagnosis: right BG infarct, subacute left temporal hemorrhage  History of Present Illness:[name] is a 72 y.o. right-handed female with history of hypertension as well as myocardial infarction maintained on aspirin therapy. Independent prior to admission living alone and driving. Admitted 12/28/2012 with altered mental status as well as seizure. MRI of the brain showed acute 1 cm right basal ganglia infarction as well as chronic hemorrhage left temporal region. Echocardiogram with ejection fraction of 40% with diffuse hypokinesis. Carotid Dopplers with no ICA stenosis. EEG consistent with a potential area for epileptogenicity in the left parietal-occipital region. Patient loaded with Keppra/Dilantin as well as Vimpat. Maintained on aspirin for CVA prophylaxis as well as the addition of subcutaneous Lovenox  for DVT prophylaxis. Maintained on a regular consistency diet.    Total: 5 NIH    Past Medical History  Past Medical History  Diagnosis Date  . MI (myocardial infarction)   . Hypertension     Family History  family history is not on file.  Prior Rehab/Hospitalizations: none  Current Medications  Current facility-administered medications:0.9 %  sodium chloride infusion, , Intravenous, Continuous, Belkys A Regalado, MD;  antiseptic oral rinse (BIOTENE) solution 15 mL, 15 mL, Mouth Rinse, Q4H, Belkys A Regalado, MD, 15 mL at 01/04/13 0820;  aspirin EC tablet 325 mg, 325 mg, Oral, Daily, Ritta Slot, MD, 325 mg at 01/04/13 1100;  carvedilol (COREG) tablet 3.125 mg, 3.125 mg, Oral, BID WC, Belkys A Regalado, MD, 3.125 mg at 01/04/13 0819 docusate sodium (COLACE) capsule 100 mg, 100 mg, Oral, BID, Esperanza Sheets, MD, 100 mg at 01/04/13 1100;  enoxaparin (LOVENOX) injection 20 mg, 20 mg, Subcutaneous, Q24H, Esperanza Sheets, MD, 20 mg at 01/03/13 1722;  hydrALAZINE (APRESOLINE) injection 5 mg, 5 mg, Intravenous, Q6H PRN, Belkys A Regalado, MD;  lacosamide (VIMPAT) tablet 100 mg, 100 mg, Oral, BID, Esperanza Sheets, MD, 100 mg at 01/04/13 1101 levETIRAcetam (KEPPRA) tablet 1,000 mg, 1,000 mg, Oral, BID,  Esperanza Sheets, MD, 1,000 mg at 01/04/13 1100;  ondansetron (ZOFRAN) injection 4 mg, 4 mg, Intravenous, Q6H PRN, Belkys A Regalado, MD;  ondansetron (ZOFRAN) tablet 4 mg, 4 mg, Oral, Q6H PRN, Belkys A Regalado, MD;  pantoprazole (PROTONIX) injection 40 mg, 40 mg, Intravenous, Q12H, Belkys A Regalado, MD, 40 mg at 01/04/13 1100 phenytoin (DILANTIN) tablet 100 mg, 100 mg, Oral, Daily, Esperanza Sheets, MD, 100 mg at 01/04/13 1100;  polyethylene glycol (MIRALAX / GLYCOLAX) packet 17 g, 17 g, Oral, Daily, Esperanza Sheets, MD, 17 g at 01/04/13 1101;  senna (SENOKOT) tablet 8.6 mg, 1 tablet, Oral, Daily, Esperanza Sheets, MD, 8.6 mg at 01/04/13 1100  Patients Current Diet: Cardiac Regular diet  with thin liquids  Precautions / Restrictions Precautions Precautions: Fall Precaution Comments: right hemiparesis Restrictions Weight Bearing Restrictions: No   Prior Activity Level Limited Community (1-2x/wk): active and independent and driving Journalist, newspaper / Equipment Home Assistive Devices/Equipment: None Home Equipment: None  Prior Functional Level Prior Function Level of Independence: Independent  Current Functional Level Cognition  Overall Cognitive Status: Impaired/Different from baseline Orientation Level: Oriented to person;Disoriented to situation;Disoriented to place;Oriented to place Following Commands: Follows one step commands with increased time;Follows one step commands consistently Safety/Judgement: Decreased awareness of deficits;Decreased awareness of safety General Comments: pt attempted to participate with PT, lilmited by back pain and kept eyes closed during treatment    Extremity Assessment (includes Sensation/Coordination)          ADLs  Grooming: Performed;Wash/dry hands;Wash/dry face;Minimal assistance Where Assessed - Grooming: Supported sitting Upper Body Bathing: Moderate assistance;Maximal assistance Where Assessed - Upper Body Bathing: Supported sitting Lower Body Bathing: +1 Total assistance Where Assessed - Lower Body Bathing: Supported sitting Upper Body Dressing: Performed;Moderate assistance;Maximal assistance Lower Body Dressing: +1 Total assistance Toilet Transfer: Performed;Moderate assistance Toilet Transfer Method: Sit to stand Toilet Transfer Equipment: Bedside commode Toileting - Clothing Manipulation and Hygiene: +1 Total assistance Where Assessed - Glass blower/designer Manipulation and Hygiene: Standing Tub/Shower Transfer Method: Not assessed Equipment Used: Gait belt Transfers/Ambulation Related to ADLs: pt required increased time to initiate sit - stand due to c/o abdominal pain, difficulty with upright standing  positon due to R side weakness/decreased balance ADL Comments: pt reluctant o initiate ADLs, slow processing    Mobility  Bed Mobility: Supine to Sit;Sitting - Scoot to Edge of Bed Rolling Right: 3: Mod assist;With rail Rolling Left: 3: Mod assist;With rail Left Sidelying to Sit: 3: Mod assist;HOB elevated;With rails Supine to Sit: 2: Max assist;HOB elevated;With rails Sitting - Scoot to Edge of Bed: 2: Max assist    Transfers  Transfers: Sit to Stand;Stand to [employer] Transfers Sit to Stand: 2: Max assist;From bed;From elevated surface Stand to Sit: 2: Max assist;To chair/3-in-1;With upper extremity assist;With armrests Stand Pivot Transfers: 2: Max assist;From elevated surface    Ambulation / Gait / Stairs / Wheelchair Mobility  Ambulation/Gait Ambulation/Gait Assistance: Not tested (comment) Ambulation/Gait: Patient Percentage: 60% Ambulation Distance (Feet): 4 Feet Assistive device: 2 person hand held assist Ambulation/Gait Assistance Details: pt able to advance LEs minimally but with Rt LE continuously buckling; pt with narrow BOS and requires 2 person (A) to maintain balance and (A) facilitating weightshift. pt returned to bed and then performed SPT to chair for safety Gait Pattern: Right flexed knee in stance;Narrow base of support;Trunk flexed;Shuffle;Decreased hip/knee flexion - right;Decreased stride length Gait velocity: very decreased  Stairs: No Wheelchair Mobility Wheelchair Mobility: No    Posture / Higher education careers adviser  Sitting Balance Static Sitting - Balance Support: Bilateral upper extremity supported;Feet supported Static Sitting - Level of Assistance: 5: Stand by assistance;4: Min assist Static Sitting - Comment/# of Minutes: at times pt requires (A) to return to midline; leans to left; tolerated sitting ~ 6 min prior to transfer  Dynamic Sitting Balance Dynamic Sitting - Balance Support: Left upper extremity supported;During functional activity;Feet  unsupported Dynamic Sitting - Level of Assistance: 3: Mod assist Static Standing Balance Static Standing - Balance Support: Bilateral upper extremity supported Static Standing - Level of Assistance: 4: Min assist Static Standing - Comment/# of Minutes: 1    Special needs/care consideration Bowel mgmt: continent Bladder mgmt: continent   Previous Home Environment Living Arrangements: Alone  Lives With: Alone Available Help at Discharge: Family;Available 24 hours/day Type of Home: House Home Layout: One level Bathroom Shower/Tub: Engineer, manufacturing systems: Standard Home Care Services: No  Discharge Living Setting Does the patient have any problems obtaining your medications?: No  Social/Family/Support Systems Patient Roles: Parent Contact Information: Carolene Gitto, son from [place] Anticipated Caregiver: son and his family Anticipated Caregiver's Contact Information: cell [phone] Ability/Limitations of Caregiver: son works but his wife is at home and can provide physical assist Caregiver Availability: 24/7 Discharge Plan Discussed with Primary Caregiver: Yes Is Caregiver In Agreement with Plan?: Yes Does Caregiver/Family have Issues with Lodging/Transportation while Pt is in Rehab?: No    Goals/Additional Needs Patient/Family Goal for Rehab: supervision to min assist PT and OT, supervision with SLP Expected length of stay: ELOS 12 to 18 days Pt/Family Agrees to Admission and willing to participate: Yes Program Orientation Provided & Reviewed with Pt/Caregiver Including Roles  & Responsibilities: Yes   Decrease burden of Care through IP rehab admission: n/a  Possible need for SNF placement upon discharge:son states never for SNF  Patient Condition: This patient's medical and functional status has changed since the consult dated: 01/03/13 in which the Rehabilitation Physician determined and documented that the patient's condition is appropriate for intensive  rehabilitative care in an inpatient rehabilitation facility. See "History of Present Illness" (above) for medical update. Functional changes are: overall max assist. Patient's medical and functional status update has been discussed with the Rehabilitation physician and patient remains appropriate for inpatient rehabilitation. Will admit to inpatient rehab today.  Preadmission Screen Completed By:  Clois Dupes, 01/04/2013 1:42 PM ______________________________________________________________________   Discussed status with Dr. Riley Kill on 01/04/13 at  1342 and received telephone approval for admission today.  Admission Coordinator:  Clois Dupes, [id number] Date 01/04/13.

## 2013-01-04 NOTE — H&P (Signed)
Physical Medicine and Rehabilitation Admission H&P    Chief Complaint  Patient presents with  . Seizures   HPI: [name] is a 72 y.o. right-handed female with history of CVA, hypertension as well as myocardial infarction maintained on aspirin therapy. Independent prior to admission living alone and driving. Admitted 12/28/2012 with altered mental status as well as seizure. MRI of the brain showed acute 1 cm right basal ganglia infarction as well as chronic hemorrhage left temporal region. Echocardiogram with ejection fraction of 40% with diffuse hypokinesis. Carotid Dopplers with no ICA stenosis. EEG consistent with a potential area for epileptogenicity in the left parietal-occipital region. Patient loaded with Keppra/Dilantin as well as Vimpat.  She has had issues with ileus and constipation --no results with enema. Dilantin dose decreased due to elevated levels.  Patient with right sided weakness with balance deficits as well as BLE instability. Therapy reports coginitive deficits with decreased memory and delayed processing.   ROS  Past Medical History  Diagnosis Date  . MI (myocardial infarction)   . Hypertension    History reviewed. No pertinent past surgical history.  No family history on file.  Social History: Lives alone but son plans on moving in past discharge. reports that she has never smoked. She does not have any smokeless tobacco history on file. She reports that she does not drink alcohol or use illicit drugs.  Allergies: No Known Allergies  Medications Prior to Admission  Medication Sig Dispense Refill  . alendronate (FOSAMAX) 70 MG tablet Take 70 mg by mouth once a week. Take with a full glass of water on an empty stomach.      . amLODipine (NORVASC) 10 MG tablet Take 10 mg by mouth daily.      . aspirin 325 MG tablet Take 325 mg by mouth daily.        Home: Home Living Family/patient expects to be discharged to:: Private residence Living Arrangements:  Alone Available Help at Discharge: Family;Available 24 hours/day Type of Home: House Home Layout: One level Home Equipment: None   Functional History:    Functional Status:  Mobility: Bed Mobility Bed Mobility: Supine to Sit;Sitting - Scoot to Edge of Bed Rolling Right: 3: Mod assist;With rail Rolling Left: 3: Mod assist;With rail Left Sidelying to Sit: 3: Mod assist;HOB elevated;With rails Supine to Sit: 2: Max assist;HOB elevated;With rails Sitting - Scoot to Edge of Bed: 2: Max assist Transfers Transfers: Sit to Stand;Stand to Sit;Stand Pivot Transfers Sit to Stand: 2: Max assist;From bed;From elevated surface Stand to Sit: 2: Max assist;To chair/3-in-1;With upper extremity assist;With armrests Stand Pivot Transfers: 2: Max assist;From elevated surface Ambulation/Gait Ambulation/Gait Assistance: Not tested (comment) Ambulation/Gait: Patient Percentage: 60% Ambulation Distance (Feet): 4 Feet Assistive device: 2 person hand held assist Ambulation/Gait Assistance Details: pt able to advance LEs minimally but with Rt LE continuously buckling; pt with narrow BOS and requires 2 person (A) to maintain balance and (A) facilitating weightshift. pt returned to bed and then performed SPT to chair for safety Gait Pattern: Right flexed knee in stance;Narrow base of support;Trunk flexed;Shuffle;Decreased hip/knee flexion - right;Decreased stride length Gait velocity: very decreased  Stairs: No Wheelchair Mobility Wheelchair Mobility: No  ADL: ADL Grooming: Performed;Wash/dry hands;Wash/dry face;Minimal assistance Where Assessed - Grooming: Supported sitting Upper Body Bathing: Moderate assistance;Maximal assistance Where Assessed - Upper Body Bathing: Supported sitting Lower Body Bathing: +1 Total assistance Where Assessed - Lower Body Bathing: Supported sitting Upper Body Dressing: Performed;Moderate assistance;Maximal assistance Lower Body Dressing: +1 Total assistance Toilet    Transfer: Performed;Moderate assistance Toilet Transfer Method: Sit to stand Toilet Transfer Equipment: Bedside commode Tub/Shower Transfer Method: Not assessed Equipment Used: Gait belt Transfers/Ambulation Related to ADLs: pt required increased time to initiate sit - stand due to c/o abdominal pain, difficulty with upright standing positon due to R side weakness/decreased balance ADL Comments: pt reluctant o initiate ADLs, slow processing  Cognition: Cognition Overall Cognitive Status: Impaired/Different from baseline Orientation Level: Oriented to person;Disoriented to situation;Disoriented to place;Oriented to place Cognition Arousal/Alertness: Awake/alert Behavior During Therapy: WFL for tasks assessed/performed Overall Cognitive Status: Impaired/Different from baseline Area of Impairment: Safety/judgement;Problem solving;Attention Orientation Level: Disoriented to;Time;Situation Memory: Decreased short-term memory;Decreased recall of precautions Following Commands: Follows one step commands with increased time;Follows one step commands consistently Safety/Judgement: Decreased awareness of deficits;Decreased awareness of safety Problem Solving: Slow processing;Decreased initiation;Difficulty sequencing;Requires verbal cues;Requires tactile cues General Comments: pt attempted to participate with PT, lilmited by back pain and kept eyes closed during treatment  Physical Exam: Blood pressure 132/86, pulse 105, temperature 98.2 F (36.8 C), temperature source Oral, resp. rate 18, height 5' (1.524 m), weight 43.046 kg (94 lb 14.4 oz), SpO2 99.00%. Physical Exam  Nursing note and vitals reviewed. Constitutional: She appears listless. She appears cachectic. She is cooperative. No distress.  Frail appearing   HENT:  Head: Normocephalic and atraumatic.  Eyes: Conjunctivae are normal. Pupils are equal, round, and reactive to light.  Neck: Normal range of motion. No JVD present. No  tracheal deviation present. No thyromegaly present.  Cardiovascular: Normal rate, regular rhythm, S1 normal and S2 normal.  Exam reveals gallop.   Respiratory: No respiratory distress. She has decreased breath sounds in the left lower field. She has no wheezes. She has no rales.  Splinting with deep breathing--holding left side.   GI: Soft. Bowel sounds are normal. She exhibits no distension. There is no tenderness. There is no rebound.  Musculoskeletal: Normal range of motion. She exhibits no edema.  Lymphadenopathy:    She has no cervical adenopathy.  Neurological: She appears listless. She exhibits normal muscle tone.  Mild left central 7, minimal tongue deviation. Left delt, bic, tricep 4-, wrist/hand 4. Decreased FMC and mild ataxia of LUE. LLE 2HF, 3-KE, ankle 3+. RUE 4 to 4+, RLE 3+ prox to 4+ distally. No gross sensory findings. Has reasonable insight and awareness.   Skin: Skin is warm and dry. She is not diaphoretic.  Psychiatric: She has a normal mood and affect. Her behavior is normal. Judgment and thought content normal.    Results for orders placed during the hospital encounter of 12/28/12 (from the past 48 hour(s))  CREATININE, SERUM     Status: Abnormal   Collection Time    01/04/13  5:40 AM      Result Value Range   Creatinine, Ser 1.20 (*) 0.50 - 1.10 mg/dL   GFR calc non Af Amer 45 (*) >90 mL/min   GFR calc Af Amer 52 (*) >90 mL/min   Comment: (NOTE)     The eGFR has been calculated using the CKD EPI equation.     This calculation has not been validated in all clinical situations.     eGFR's persistently <90 mL/min signify possible Chronic Kidney     Disease.   No results found.  Post Admission Physician Evaluation: 1. Functional deficits secondary  to thrombotic right basal ganglia infarct 2. Patient is admitted to receive collaborative, interdisciplinary care between the physiatrist, rehab nursing staff, and therapy team. 3. Patient's level of medical complexity  and   substantial therapy needs in context of that medical necessity cannot be provided at a lesser intensity of care such as a SNF. 4. Patient has experienced substantial functional loss from his/her baseline which was documented above under the "Functional History" and "Functional Status" headings.  Judging by the patient's diagnosis, physical exam, and functional history, the patient has potential for functional progress which will result in measurable gains while on inpatient rehab.  These gains will be of substantial and practical use upon discharge  in facilitating mobility and self-care at the household level. 5. Physiatrist will provide 24 hour management of medical needs as well as oversight of the therapy plan/treatment and provide guidance as appropriate regarding the interaction of the two. 6. 24 hour rehab nursing will assist with bladder management, bowel management, safety, skin/wound care, disease management, medication administration, pain management and patient education  and help integrate therapy concepts, techniques,education, etc. 7. PT will assess and treat for/with: Lower extremity strength, range of motion, stamina, balance, functional mobility, safety, adaptive techniques and equipment, NMR, education.   Goals are: supervision to minimal assist. 8. OT will assess and treat for/with: ADL's, functional mobility, safety, upper extremity strength, adaptive techniques and equipment, NMR, education, stamina.   Goals are: supervision to min assist. 9. SLP will assess and treat for/with: n/a.  Goals are: n/a. 10. Case Management and Social Worker will assess and treat for psychological issues and discharge planning. 11. Team conference will be held weekly to assess progress toward goals and to determine barriers to discharge. 12. Patient will receive at least 3 hours of therapy per day at least 5 days per week. 13. ELOS: 18- 24 days      14. Prognosis:  good   Medical Problem List and  Plan: Right basal ganglia infarct and new onset of seizures 1. DVT Prophylaxis/Anticoagulation: Pharmaceutical: Lovenox 2. Pain Management: complains of left UQ pain, rib pain. ?recent fall  -utilize heating pad  -prn tylenol  -observe for now  -needs bm 3. Mood: pt flat, but this largely due to fatigue, meds, medical issues, etc. Team to provide ego support as possible to pt. Seems pretty motivated, and is feeling "more like herself" today. 4. Neuropsych: This patient is capable of making decisions on her own behalf. 5. New onset seizure: Continue Keppra, dilantin, and vimpat. Recheck dilantin level next week.  6. HTN:  Will monitor with bid checks.  7. Constipation: Will set bowel program. Repeat enema today.   Zachary T. Swartz, MD, FAAPMR [facility]   01/04/2013 

## 2013-01-04 NOTE — Progress Notes (Signed)
[facility] PROGRESS NOTE  [name] WUJ:[id number] DOB: 1940/08/09 DOA: 12/28/2012 PCP: Default, Provider, MD  Assessment/Plan: 72 y.o. female with PMH significant for hypertension, MI who was brought to ED by family member with AMS. History obtain from family. Patient unable to provide history due to AMS. Patient become confuse, not following command, weak. On her way to ED friend witness generalize muscle movement.  1. 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 Continue with Kepra and dilantin. EEG: irregular left parietal-occipital delta activity, Left parieto-occipital sharp waves. Patient had worsening of right side weakness. She was started on Vimpat. Neurology following. changed meds PO, decreased the dose due to drowsiness;  2. 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 No need for TEE per neurology recommendation. Carotid doppler: Preliminary report: 1-39% ICA stenosis. Vertebral artery flow is antegrade. LDL 40, Hb A-1c; 5.2  -Continue with aspirin. CIR consulted.  3. Hypertension: Continue with coreg. Will consider start low dose lisinopril if renal function allows it. PRN hydralazine for SBP more than 190, diastolic more than 100.  4. Metabolic acidosis. Lactic acid was normal. Resolved.  5. Abdominal Pain: initially resolved. Continue with protonix. KUB 11-29 with Mildly prominent loops of colon in the right lower quadrant, possibly reflecting adynamic colonic ileus.  Lipase normal. Lactic acid normal.  Tolerating diet. Patient with recurrence of abdominal pain 12-02. Will repeat KUB. Might need laxatives.  6. Acute renal insufficiency: cr on admission at 1.6. Resume IV low rate. Cr mildly increase today.  7. Encephalopathy/AMS; in setting of stroke and seizures. Improving.  8. 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  9. Sinus tachycardia; HR 100. Monitor.  TSH at 0.83. coreg due to cardiomyopathy.  10. Cardiomyopathy EF 35 to 40%, last Ef per Cath 2010 was normal at 55 %. Continue with coreg. I will start ACE when renal function allows it. Needs to follow up with Dr Eldridge Dace in 1 week.   Awaiting for rehab placement  -SW involved   Code Status: full Family Communication: son at the bedside (indicate person spoken with, relationship, and if by phone, the number) Disposition Plan: pend CIR eval   Consultants:  Neurology   Procedures: ECHO;Left ventricle: The cavity size was normal. Systolic function was moderately reduced. The estimated ejection fraction was in the range of 35% to 40%. Diffuse hypokinesis. - Mitral valve: Mild regurgitation. - Pulmonary arteries: PA peak pressure: 48mm Hg (S).  Doppler: Preliminary report: 1-39% ICA stenosis. Vertebral artery flow is antegrade.   Antibiotics:  None  (indicate start date, and stop date if known)  HPI/Subjective: alert  Objective: Filed Vitals:   01/04/13 1000  BP: 132/86  Pulse: 105  Temp: 98.2 F (36.8 C)  Resp: 18    Intake/Output Summary (Last 24 hours) at 01/04/13 1321 Last data filed at 01/04/13 0900  Gross per 24 hour  Intake    540 ml  Output      0 ml  Net    540 ml   Filed Weights   01/01/13 2110 01/02/13 2030 01/03/13 2158  Weight: 41.459 kg (91 lb 6.4 oz) 43.591 kg (96 lb 1.6 oz) 43.046 kg (94 lb 14.4 oz)    Exam:   General:  alert  Cardiovascular: s1,s2 rrr  Respiratory: CTA BL  Abdomen: soft, nt, nd   Musculoskeletal: no LE edema   Data Reviewed: Basic Metabolic Panel:  Recent Labs Lab 12/29/12 0430  12/30/12 0530 12/31/12 0800 01/01/13 0530 01/02/13 0535 01/04/13 0540  NA 135 136 137 137 139  --   K 4.6 4.3 3.9 4.1 4.1  --   CL 102 103 98 100 105  --   CO2 18* 17* 22 24 22   --   GLUCOSE 106* 72 109* 105* 99  --   BUN 15 17 14 22 21   --   CREATININE 1.11* 1.34* 1.04 1.48* 1.29* 1.20*  CALCIUM 8.6 8.8 9.5 8.9 8.5  --     Liver Function Tests:  Recent Labs Lab 12/29/12 0430  AST 20  ALT 11  ALKPHOS 72  BILITOT 0.7  PROT 6.9  ALBUMIN 3.2*    Recent Labs Lab 12/29/12 0900  LIPASE 18   No results found for this basename: AMMONIA,  in the last 168 hours CBC:  Recent Labs Lab 12/29/12 0430 12/30/12 0530 01/02/13 0535  WBC 7.6 5.9 7.4  HGB 13.3 11.9* 12.2  HCT 39.4 35.6* 36.1  MCV 96.8 96.7 96.3  PLT 225 216 224   Cardiac Enzymes:  Recent Labs Lab 12/29/12 0430  TROPONINI <0.30   BNP (last 3 results) No results found for this basename: PROBNP,  in the last 8760 hours CBG: No results found for this basename: GLUCAP,  in the last 168 hours  Recent Results (from the past 240 hour(s))  MRSA PCR SCREENING     Status: None   Collection Time    12/28/12  1:43 PM      Result Value Range Status   MRSA by PCR NEGATIVE  NEGATIVE Final   Comment:            The GeneXpert MRSA Assay (FDA     approved for NASAL specimens     only), is one component of a     comprehensive MRSA colonization     surveillance program. It is not     intended to diagnose MRSA     infection nor to guide or     monitor treatment for     MRSA infections.     Studies: No results found.  Scheduled Meds: . antiseptic oral rinse  15 mL Mouth Rinse Q4H  . aspirin EC  325 mg Oral Daily  . carvedilol  3.125 mg Oral BID WC  . docusate sodium  100 mg Oral BID  . enoxaparin (LOVENOX) injection  20 mg Subcutaneous Q24H  . lacosamide  100 mg Oral BID  . levETIRAcetam  1,000 mg Oral BID  . pantoprazole (PROTONIX) IV  40 mg Intravenous Q12H  . phenytoin  100 mg Oral Daily  . polyethylene glycol  17 g Oral Daily  . senna  1 tablet Oral Daily   Continuous Infusions: . sodium chloride      Active Problems:   Generalized seizure   Hypertension   Seizure   Metabolic acidosis   Encephalopathy   Acute renal failure    Time spent: >35 minutes     Esperanza Sheets  [facility] Pager [phone].  If 7PM-7AM, please contact night-coverage at [email or web address], password [email or web address] 01/04/2013, 1:21 PM  LOS: 7 days

## 2013-01-04 NOTE — Interval H&P Note (Signed)
[name] was admitted today to Inpatient Rehabilitation with the diagnosis of right basal ganglia infarct.  The patient's history has been reviewed, patient examined, and there is no change in status.  Patient continues to be appropriate for intensive inpatient rehabilitation.  I have reviewed the patient's chart and labs.  Questions were answered to the patient's satisfaction.  [name] T 01/04/2013, 4:42 PM

## 2013-01-05 ENCOUNTER — Inpatient Hospital Stay (HOSPITAL_COMMUNITY): Payer: Medicare Other | Admitting: Physical Therapy

## 2013-01-05 ENCOUNTER — Inpatient Hospital Stay (HOSPITAL_COMMUNITY): Payer: Medicare Other

## 2013-01-05 ENCOUNTER — Inpatient Hospital Stay (HOSPITAL_COMMUNITY): Payer: Medicare Other | Admitting: *Deleted

## 2013-01-05 DIAGNOSIS — I6350 Cerebral infarction due to unspecified occlusion or stenosis of unspecified cerebral artery: Secondary | ICD-10-CM

## 2013-01-05 DIAGNOSIS — I10 Essential (primary) hypertension: Secondary | ICD-10-CM

## 2013-01-05 LAB — URINALYSIS, ROUTINE W REFLEX MICROSCOPIC
Bilirubin Urine: NEGATIVE
Glucose, UA: NEGATIVE mg/dL
Hgb urine dipstick: NEGATIVE
Ketones, ur: NEGATIVE mg/dL
Leukocytes, UA: NEGATIVE
Nitrite: NEGATIVE
Protein, ur: NEGATIVE mg/dL
Specific Gravity, Urine: 1.008 (ref 1.005–1.030)
Urobilinogen, UA: 0.2 mg/dL (ref 0.0–1.0)
pH: 6 (ref 5.0–8.0)

## 2013-01-05 MED ORDER — FLEET ENEMA 7-19 GM/118ML RE ENEM: 1.0000 | ENEMA | Freq: Every day | RECTAL | Status: DC | PRN

## 2013-01-05 NOTE — Evaluation (Signed)
Occupational Therapy Assessment and Plan  Patient Details  Name: [name] MRN: [id number] Date of Birth: 03/12/1940  OT Diagnosis: cognitive deficits and muscle weakness (generalized) Rehab Potential: Rehab Potential: Good ELOS: 8-10 days Today's Date: 01/05/2013 Time: 1100-1210 Time Calculation (min): 70 min  Problem List:  Patient Active Problem List   Diagnosis Date Noted  . CVA (cerebral infarction) 01/04/2013  . Generalized seizure 12/28/2012  . Hypertension 12/28/2012  . Seizure 12/28/2012  . Metabolic acidosis 12/28/2012  . Encephalopathy 12/28/2012  . Acute renal failure 12/28/2012    Past Medical History:  Past Medical History  Diagnosis Date  . MI (myocardial infarction)   . Hypertension    Past Surgical History: No past surgical history on file.  Assessment & Plan Clinical Impression:HPI: [name] is a 72 y.o. right-handed female with history of CVA, hypertension as well as myocardial infarction maintained on aspirin therapy. Independent prior to admission living alone and driving. Admitted 12/28/2012 with altered mental status as well as seizure. MRI of the brain showed acute 1 cm right basal ganglia infarction as well as chronic hemorrhage left temporal region. Echocardiogram with ejection fraction of 40% with diffuse hypokinesis. Carotid Dopplers with no ICA stenosis. EEG consistent with a potential area for epileptogenicity in the left parietal-occipital region. Patient loaded with Keppra/Dilantin as well as Vimpat. She has had issues with ileus and constipation --no results with enema. Dilantin dose decreased due to elevated levels. Patient with right sided weakness with balance deficits as well as BLE instability. Therapy reports coginitive deficits with decreased memory and delayed processing.  ROS  Past Medical History   Diagnosis  Date   .  MI (myocardial infarction)    .  Hypertension     History reviewed. No pertinent past surgical history.  No  family history on file.  Social History: Lives alone but son plans on moving in past discharge. reports that she has never smoked. She does not have any smokeless tobacco history on file. She reports that she does not drink alcohol or use illicit drugs. Patient transferred to CIR on 01/04/2013 .    Patient currently requires mod with basic self-care skills secondary to muscle weakness and decreased initiation, decreased attention, decreased awareness, decreased problem solving, decreased safety awareness, decreased memory and delayed processing.  Prior to hospitalization, patient could complete BADL with independent .  Patient will benefit from skilled intervention to increase independence with basic self-care skills prior to discharge home with care partner.  Anticipate patient will require 24 hour supervision and follow up home health.  OT - End of Session Activity Tolerance: Tolerates 10 - 20 min activity with multiple rests Endurance Deficit: Yes Endurance Deficit Description:  (pt. complained of fatigue halfway thr session) OT Assessment Rehab Potential: Good Barriers to Discharge: Decreased caregiver support Barriers to Discharge Comments:  (cognntion, decreased family support) OT Patient demonstrates impairments in the following area(s): Balance;Cognition;Endurance;Motor;Perception;Safety;Skin Integrity OT Basic ADL's Functional Problem(s): Grooming;Bathing;Dressing;Toileting OT Advanced ADL's Functional Problem(s): Simple Meal Preparation OT Transfers Functional Problem(s): Toilet;Tub/Shower OT Additional Impairment(s): Other (comment) (decreased UE strength) OT Plan OT Intensity: Minimum of 1-2 x/day, 45 to 90 minutes OT Frequency: 5 out of 7 days OT Duration/Estimated Length of Stay: 8-10 daysdays OT Treatment/Interventions: Balance/vestibular training;Cognitive remediation/compensation;Community reintegration;Discharge planning;DME/adaptive equipment instruction;Functional mobility  training;Neuromuscular re-education;Patient/family education;Psychosocial support;Self Care/advanced ADL retraining;Therapeutic Activities;Therapeutic Exercise;UE/LE Strength taining/ROM OT Basic Self-Care Anticipated Outcome(s): supervision OT Toileting Anticipated Outcome(s): supervision OT Bathroom Transfers Anticipated Outcome(s): supervision OT Recommendation Patient destination: Home Follow Up Recommendations: Skilled  nursing facility;None;24 hour supervision/assistance Equipment Recommended: To be determined   Skilled Therapeutic Intervention Addressed bed mobility, transfers to bathroom and standing balance during ADL.  Pt. Demonstrated genralized weakness with stepping response during standing.   OT Evaluation Precautions/Restrictions  Precautions Precautions: Fall Precaution Comments: right hemiparesis Restrictions Weight Bearing Restrictions: No General      Pain  none   Home Living/Prior Functioning Home Living Available Help at Discharge: Family;Available 24 hours/day Type of Home: Apartment Home Layout:  (lives in 3rd floor apt) Additional Comments: 12-14 steps  Lives With: Alone IADL History Homemaking Responsibilities: Yes Meal Prep Responsibility: Primary Laundry Responsibility: Primary Cleaning Responsibility: Primary Bill Paying/Finance Responsibility: Primary Shopping Responsibility: Primary Current License: Yes Mode of Transportation: Bus Occupation: Retired Prior Function Level of Independence: Independent with homemaking with ambulation;Independent with gait  Able to Take Stairs?: Yes Driving: Yes Leisure:  (sleeps alot) ADL ADL Grooming: Independent;Moderate assistance Where Assessed-Grooming: Standing at sink;Sitting at sink Upper Body Bathing: Moderate cueing Where Assessed-Upper Body Bathing: Sitting at sink;Standing at sink Lower Body Bathing: Moderate assistance Where Assessed-Lower Body Bathing: Sitting at sink;Standing at  sink Upper Body Dressing: Not assessed Lower Body Dressing: Not assessed Toileting: Moderate assistance Where Assessed-Toileting: Teacher, adult education: Moderate assistance Toilet Transfer Method: Stand pivot;Ambulating Acupuncturist: Engineer, technical sales: Not assessed Tub/Shower Transfer Method: Unable to assess Vision/Perception  Vision - History Baseline Vision: Wears glasses all the time Patient Visual Report: No change from baseline Vision - Assessment Eye Alignment: Within Functional Limits Vision Assessment: Vision not tested Perception Perception: Within Functional Limits Praxis Praxis: Intact  Cognition Overall Cognitive Status: Impaired/Different from baseline Arousal/Alertness: Awake/alert Orientation Level: Oriented X4 Attention: Sustained Sustained Attention: Appears intact Memory: Impaired Memory Impairment: Retrieval deficit;Decreased recall of new information;Storage deficit;Decreased short term memory Decreased Short Term Memory: Verbal basic;Functional basic Awareness: Impaired Awareness Impairment: Intellectual impairment Problem Solving: Impaired Problem Solving Impairment: Verbal basic;Functional basic Executive Function: Reasoning;Sequencing;Organizing;Decision Making;Initiating Reasoning: Impaired Reasoning Impairment: Verbal basic;Functional basic Sequencing: Impaired Sequencing Impairment: Verbal basic;Functional basic Organizing: Impaired Organizing Impairment: Verbal basic;Functional basic Decision Making: Impaired Decision Making Impairment: Verbal basic;Functional basic Initiating: Impaired Initiating Impairment: Verbal basic;Functional basic Behaviors:  (pt thinking her son was present during session) Safety/Judgment: Impaired Sensation Sensation Light Touch: Appears Intact Proprioception: Appears Intact Coordination Gross Motor Movements are Fluid and Coordinated: Yes Fine Motor Movements are Fluid and  Coordinated: Yes Motor    Mobility  Bed Mobility Bed Mobility: Rolling Right;Supine to Sit Rolling Right: 3: Mod assist;With rail Rolling Right Details: Manual facilitation for placement;Verbal cues for sequencing;Verbal cues for technique Supine to Sit: 3: Mod assist Supine to Sit Details: Manual facilitation for weight bearing;Manual facilitation for placement;Verbal cues for technique Transfers Sit to Stand: 3: Mod assist;With upper extremity assist Sit to Stand Details: Verbal cues for sequencing;Verbal cues for technique;Verbal cues for precautions/safety Stand to Sit: 4: Min assist  Trunk/Postural Assessment  Cervical Assessment Cervical Assessment: Within Functional Limits Thoracic Assessment Thoracic Assessment: Within Functional Limits Lumbar Assessment Lumbar Assessment: Within Functional Limits Postural Control Postural Control: Deficits on evaluation Protective Responses:  (decreased balance with stepping response noted)  Balance Static Sitting Balance Static Sitting - Balance Support: Bilateral upper extremity supported;Feet supported Static Sitting - Level of Assistance: 5: Stand by assistance;4: Min assist Dynamic Sitting Balance Dynamic Sitting - Balance Support: Left upper extremity supported;During functional activity;Feet unsupported Dynamic Sitting - Level of Assistance: 5: Stand by assistance Static Standing Balance Static Standing - Balance Support: Bilateral upper extremity supported Static Standing -  Level of Assistance: 4: Min assist Extremity/Trunk Assessment RUE Assessment RUE Assessment: Within Functional Limits LUE Assessment LUE Assessment: Exceptions to [facility] LUE Strength LUE Overall Strength: Deficits (4/5 MMT)     Refer to Care Plan for Long Term Goals  Recommendations for other services: None  Discharge Criteria: Patient will be discharged from OT if patient refuses treatment 3 consecutive times without medical reason, if treatment goals  not met, if there is a change in medical status, if patient makes no progress towards goals or if patient is discharged from hospital.  The above assessment, treatment plan, treatment alternatives and goals were discussed and mutually agreed upon: by patient  Humberto Seals 01/05/2013, 12:56 PM

## 2013-01-05 NOTE — Progress Notes (Signed)
Subjective/Complaints: Complains of constipation. Had low grade temp early yesterday evening. No cough, chills, sob A 12 point review of systems has been performed and if not noted above is otherwise negative.   Objective: Vital Signs: Blood pressure 127/86, pulse 100, temperature 98.3 F (36.8 C), temperature source Oral, resp. rate 18, height 5\' 2"  (1.575 m), weight 44.1 kg (97 lb 3.6 oz), SpO2 95.00%. Dg Chest 2 View  01/05/2013   CLINICAL DATA:  Pneumonia.  EXAM: CHEST  2 VIEW  COMPARISON:  01/04/2013  FINDINGS: Retrocardiac airspace opacity is improved. Right lower lobe airspace opacity is low minimally improved.  Mildly enlarged cardiopericardial silhouette, stable. Small bilateral pleural effusions. Mid thoracic compression fractures.  IMPRESSION: 1. Improved bibasilar airspace opacities compatible with improving pneumonia. Small bilateral pleural effusions persist. 2. Stable cardiomegaly.   Electronically Signed   By: [name]   On: 01/05/2013 08:15   Dg Chest 2 View  01/04/2013   CLINICAL DATA:  Fever.  Left lower rib pain.  EXAM: CHEST  2 VIEW  COMPARISON:  12/20/2012.  FINDINGS: Interval airspace opacity at both lung bases. Small bilateral pleural effusions. The lungs are mildly hyperexpanded. Stable enlarged cardiac silhouette. Diffuse osteopenia. No visible rib abnormality.  IMPRESSION: 1. Interval bibasilar pneumonia and small bilateral pleural effusions. 2. Stable cardiomegaly and mild changes of COPD.   Electronically Signed   By: Gordan Payment M.D.   On: 01/04/2013 20:38   No results found for this basename: WBC, HGB, HCT, PLT,  in the last 72 hours  Recent Labs  01/04/13 0540  CREATININE 1.20*   CBG (last 3)  No results found for this basename: GLUCAP,  in the last 72 hours  Wt Readings from Last 3 Encounters:  01/04/13 44.1 kg (97 lb 3.6 oz)  01/03/13 43.046 kg (94 lb 14.4 oz)    Physical Exam:  Nursing note and vitals reviewed.  Constitutional: She  appears listless. She appears cachectic. She is cooperative. No distress.  Frail appearing HENT:  Head: Normocephalic and atraumatic.  Eyes: Conjunctivae are normal. Pupils are equal, round, and reactive to light.  Neck: Normal range of motion. No JVD present. No tracheal deviation present. No thyromegaly present.  Cardiovascular: Normal rate, regular rhythm, S1 normal and S2 normal. Exam reveals gallop.  Respiratory: No respiratory distress. She has decreased breath sounds +/- at bases. She has no wheezes. She has no rales.  GI: Soft. Bowel sounds are normal. She exhibits no distension. There is no rebound.  Musculoskeletal: Normal range of motion. She exhibits no edema. Mild tenderness in LUQ, lower rib cage Lymphadenopathy:  She has no cervical adenopathy.  Neurological: She appears listless. She exhibits normal muscle tone. Neuro exam stable Mild left central 7, minimal tongue deviation. Left delt, bic, tricep 4-, wrist/hand 4. Decreased FMC and mild ataxia of LUE. LLE 2HF, 3-KE, ankle 3+. RUE 4 to 4+, RLE 3+ prox to 4+ distally. No gross sensory findings. Has reasonable insight and awareness.  Skin: Skin is warm and dry. She is not diaphoretic.  Psychiatric: She has a normal mood and affect. Her behavior is normal. Judgment and thought content normal.    Assessment/Plan: 1. Functional deficits secondary to thrombotic right basal ganglia infarct which require 3+ hours per day of interdisciplinary therapy in a comprehensive inpatient rehab setting. Physiatrist is providing close team supervision and 24 hour management of active medical problems listed below. Physiatrist and rehab team continue to assess barriers to discharge/monitor patient progress toward functional and medical  goals. FIM:                   Comprehension Comprehension Mode: Auditory Comprehension: 5-Understands complex 90% of the time/Cues < 10% of the time  Expression Expression Mode: Verbal Expression:  5-Expresses complex 90% of the time/cues < 10% of the time  Social Interaction Social Interaction: 4-Interacts appropriately 75 - 89% of the time - Needs redirection for appropriate language or to initiate interaction.  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 DVT Prophylaxis/Anticoagulation: Pharmaceutical: Lovenox  2. Pain Management: complains of left UQ pain, rib pain. ?recent fall  -heating pad  -prn tylenol  3. Mood: pt flat, but this largely due to fatigue, meds, medical issues, etc. Team to provide ego support as possible to pt. Seems pretty motivated, and is feeling "more like herself" today.  4. Neuropsych: This patient is capable of making decisions on her own behalf.  5. New onset seizure: Continue Keppra, dilantin, and vimpat. Recheck dilantin level next week.  6. HTN: Will monitor with bid checks.  7. Constipation: Will set bowel program. Suppository today, enema if needed 8. ?bibasilar pneumonia/ low grade temp---I wasn't overly impressed with cxr read as pneumonia--today's cxr already shows "improvement" after one dose of levaquin last night  -will continue abx for now  -encourage IS  -ua neg, ucx pending  LOS (Days) 1 A FACE TO FACE EVALUATION WAS PERFORMED  SWARTZ,ZACHARY T 01/05/2013 8:21 AM

## 2013-01-05 NOTE — Progress Notes (Signed)
Physical Therapy Session Note  Patient Details  Name: [name] MRN: [id number] Date of Birth: [birth year]  Today's Date: 01/05/2013 Time: 1401-1431 Time Calculation (min): 30 min  Short Term Goals: Week 1:  PT Short Term Goal 1 (Week 1): Pt will roll R/L with side rail and min A. PT Short Term Goal 2 (Week 1): Pt will transfer supine to edge of bed, edge of bed to supine with min A.  PT Short Term Goal 3 (Week 1): Pt will perform stand pivot transfers with min A.  PT Short Term Goal 4 (Week 1): Pt will ambulate about 150 feet with LRAD and min A.  PT Short Term Goal 5 (Week 1): Pt will ascend/descend 12-14 steps with 1 rail and min A.   Skilled Therapeutic Interventions/Progress Updates:  Pt ascended.descended 2 stairs with B rails and min to mod A with verbal cues. Pt ambulated with rolling walker and min A for 150 feet with step through gait pattern, verbal cues for safety. Pt transferred w/c to edge of bed with mod A and verbal cues. Pt transferred edge of bed to supine with mod A.    Therapy Documentation Precautions:  Precautions Precautions: Fall Precaution Comments: right hemiparesis Restrictions Weight Bearing Restrictions: No General: Amount of Missed PT Time (min): 15 Minutes Missed Time Reason: Patient fatigue  Pain: Generalized c/o discomfort during treatment.   Mobility: Bed Mobility Bed Mobility: Supine to Sit;Sit to Supine Supine to Sit: 3: Mod assist Supine to Sit Details: Verbal cues for technique;Verbal cues for precautions/safety;Manual facilitation for placement Sit to Supine: 3: Mod assist Sit to Supine - Details: Verbal cues for technique;Verbal cues for precautions/safety;Manual facilitation for placement Transfers Sit to Stand: 3: Mod assist Sit to Stand Details: Verbal cues for technique;Verbal cues for precautions/safety Stand Pivot Transfers: 3: Mod assist Stand Pivot Transfer Details: Verbal cues for technique;Verbal cues for  precautions/safety Locomotion : Ambulation Ambulation: Yes Ambulation/Gait Assistance: 3: Mod assist Stairs / Additional Locomotion Stairs: Yes Stairs Assistance: 3: Mod assist Stair Management Technique: Two rails Number of Stairs: 2 Wheelchair Mobility Wheelchair Mobility: Yes Wheelchair Assistance: 2: Max assist Distance: 25 feet  Trunk/Postural Assessment : Cervical Assessment Cervical Assessment: Within Functional Limits Thoracic Assessment Thoracic Assessment: Within Functional Limits Lumbar Assessment Lumbar Assessment: Within Functional Limits Postural Control Postural Control: Deficits on evaluation  Balance: Balance Balance Assessed: No Static Sitting Balance Static Sitting - Balance Support: Bilateral upper extremity supported;Feet unsupported Static Sitting - Level of Assistance: 5: Stand by assistance Static Standing Balance Static Standing - Balance Support: Bilateral upper extremity supported Static Standing - Level of Assistance: 3: Mod assist;4: Min assist  See FIM for current functional status  Therapy/Group: Individual Therapy  Rayford Halsted 01/05/2013, 4:20 PM

## 2013-01-05 NOTE — Progress Notes (Signed)
Physical Therapy Note  Patient Details  Name: Briya Lookabaugh MRN: [id number] Date of Birth: [birth year] Today's Date: 01/05/2013  1555-1620 (25 minutes) individual Pain: pt reports unrated intermittent back pain with movement/premedicated Focus of treatment: Therapeutic exercise focused on activity tolerance Treatment: Pt in bed upon arrival; supine to sit mod assist; transfers min assist sit to stand / min assist stand/turn; Nustep Level 4 x 10 minutes with encouragement to continue; gait to room min assist (hand hold on left) 120 feet ; sit to supine mod assist bilateral LEs.; bed alarm activated.    Najeh Credit,JIM 01/05/2013, 4:01 PM

## 2013-01-05 NOTE — Evaluation (Signed)
Physical Therapy Assessment and Plan  Patient Details  Name: [name] MRN: [id number] Date of Birth: 1940-10-21  PT Diagnosis: Abnormality of gait, Difficulty walking and Muscle weakness Rehab Potential: Good ELOS: 8 to 10 days   Today's Date: 01/05/2013 Time: 1300-1400 Time Calculation (min): 60 min  Problem List:  Patient Active Problem List   Diagnosis Date Noted  . CVA (cerebral infarction) 01/04/2013  . Generalized seizure 12/28/2012  . Hypertension 12/28/2012  . Seizure 12/28/2012  . Metabolic acidosis 12/28/2012  . Encephalopathy 12/28/2012  . Acute renal failure 12/28/2012    Past Medical History:  Past Medical History  Diagnosis Date  . MI (myocardial infarction)   . Hypertension    Past Surgical History: No past surgical history on file.  Assessment & Plan Clinical Impression: Pt is a 72 y.o. right-handed female with history of CVA, hypertension as well as myocardial infarction maintained on aspirin therapy. Independent prior to admission living alone and driving. Admitted 12/28/2012 with altered mental status as well as seizure. MRI of the brain showed acute 1 cm right basal ganglia infarction as well as chronic hemorrhage left temporal region. Echocardiogram with ejection fraction of 40% with diffuse hypokinesis. Carotid Dopplers with no ICA stenosis. EEG consistent with a potential area for epileptogenicity in the left parietal-occipital region. Patient loaded with Keppra/Dilantin as well as Vimpat. She has had issues with ileus and constipation --no results with enema. Dilantin dose decreased due to elevated levels. Patient with right sided weakness with balance deficits as well as BLE instability.   Patient transferred to CIR on 01/04/2013 .   Patient currently requires mod with mobility secondary to muscle weakness, decreased cardiorespiratoy endurance and decreased coordination.  Prior to hospitalization, patient was independent  with mobility and lived with  Alone in a Apartment home.    Patient will benefit from skilled PT intervention to maximize safe functional mobility, minimize fall risk and decrease caregiver burden for planned discharge home with 24 hour supervision.  Anticipate patient will benefit from follow up HH at discharge.  PT - End of Session Activity Tolerance: Tolerates 30+ min activity with multiple rests Endurance Deficit: Yes PT Assessment Rehab Potential: Good Barriers to Discharge: Inaccessible home environment PT Patient demonstrates impairments in the following area(s): Balance;Endurance;Motor;Safety;Perception PT Transfers Functional Problem(s): Bed Mobility;Bed to Chair;Car PT Locomotion Functional Problem(s): Ambulation;Stairs PT Plan PT Intensity: Minimum of 1-2 x/day ,45 to 90 minutes PT Frequency: 5 out of 7 days PT Duration Estimated Length of Stay: 8 to 10 days PT Treatment/Interventions: Warden/ranger;Ambulation/gait training;Discharge planning;DME/adaptive equipment instruction;Functional mobility training;Patient/family education;Neuromuscular re-education;Splinting/orthotics;Stair training;UE/LE Strength taining/ROM;UE/LE Coordination activities;Therapeutic Activities;Therapeutic Exercise PT Transfers Anticipated Outcome(s): S PT Locomotion Anticipated Outcome(s): S for ambulation with LRAD, min guard for stairs PT Recommendation Follow Up Recommendations: Home health PT Patient destination: Home Equipment Recommended: Rolling walker with 5" wheels;Wheelchair (measurements)  Skilled Therapeutic Intervention Pt was seen bedside in the pm. PT evaluation was completed and treatment plan initiated. Pt ambulated with rolling walker x 3 for 15 feet with min to mod A and verbal cues for technique. Following evaluation discussed with pt's daughter, who explained that at discharge pt will be moving in with either the son or the daughter. Pt was issued and w/c and rolling walker.   PT  Evaluation Precautions/Restrictions Precautions Precautions: Fall Precaution Comments: right hemiparesis Restrictions Weight Bearing Restrictions: No General  Pain: Generalized c/o discomfort throughout evaluation.    Home Living/Prior Functioning Home Living Available Help at Discharge: Family;Available 24 hours/day Additional Comments: Discussed  with daughter, pt will either move in with daughter or son, daughter lives in apt with 12-14 steps to enter with rail, son lives in home with 4 steps to enter with rail, daughter and son with take turns at discharge, daughter is not sure of exact discharge location at this time  Prior to hospitalization the pt was living alone, I with mobility.  Prior Function Level of Independence: Independent with basic ADLs;Independent with gait;Independent with transfers  Able to Take Stairs?: Yes Driving: Yes Vocation: Retired Optometrist - History Baseline Vision: Wears glasses all the time Perception Perception: Within Functional Limits  Cognition Overall Cognitive Status: Impaired/Different from baseline Arousal/Alertness: Awake/alert Memory: Impaired Memory Impairment: Decreased short term memory;Retrieval deficit;Decreased recall of new information Awareness: Impaired Problem Solving: Impaired Reasoning: Impaired Decision Making: Impaired Safety/Judgment: Impaired Sensation Sensation Light Touch: Impaired by gross assessment Additional Comments: impaired to light touch B LEs below mid calf Motor     Mobility Bed Mobility Bed Mobility: Supine to Sit;Sit to Supine Supine to Sit: 3: Mod assist Supine to Sit Details: Verbal cues for technique;Verbal cues for precautions/safety;Manual facilitation for placement Sit to Supine: 3: Mod assist Sit to Supine - Details: Verbal cues for technique;Verbal cues for precautions/safety;Manual facilitation for placement Transfers Sit to Stand: 3: Mod assist Sit to Stand Details:  Verbal cues for technique;Verbal cues for precautions/safety Stand Pivot Transfers: 3: Mod assist Stand Pivot Transfer Details: Verbal cues for technique;Verbal cues for precautions/safety Locomotion  Ambulation Ambulation: Yes Ambulation/Gait Assistance: 3: Mod assist Stairs / Additional Locomotion Stairs: Yes Stairs Assistance: 3: Mod assist Stair Management Technique: Two rails Number of Stairs: 2 Wheelchair Mobility Wheelchair Mobility: Yes Wheelchair Assistance: 2: Max assist Distance: 25 feet  Trunk/Postural Assessment  Cervical Assessment Cervical Assessment: Within Functional Limits Thoracic Assessment Thoracic Assessment: Within Functional Limits Lumbar Assessment Lumbar Assessment: Within Functional Limits Postural Control Postural Control: Deficits on evaluation  Balance Balance Balance Assessed: No Static Sitting Balance Static Sitting - Balance Support: Bilateral upper extremity supported;Feet unsupported Static Sitting - Level of Assistance: 5: Stand by assistance Static Standing Balance Static Standing - Balance Support: Bilateral upper extremity supported Static Standing - Level of Assistance: 3: Mod assist;4: Min assist Extremity Assessment B UEs as per OT evaluation.    RLE Assessment RLE Assessment: Exceptions to [facility] RLE AROM (degrees) Overall AROM Right Lower Extremity: Deficits;Due to decreased strength RLE Strength RLE Overall Strength: Deficits RLE Overall Strength Comments: overall strength 2+/5-3-/5 LLE Assessment LLE Assessment: Exceptions to WFL LLE AROM (degrees) Overall AROM Left Lower Extremity: Deficits;Due to decreased strength LLE Strength LLE Overall Strength: Deficits LLE Overall Strength Comments: grossly 3-/5  FIM:  FIM - Bed/Chair Transfer Bed/Chair Transfer: 3: Supine > Sit: Mod A (lifting assist/Pt. 50-74%/lift 2 legs;3: Sit > Supine: Mod A (lifting assist/Pt. 50-74%/lift 2 legs);3: Bed > Chair or W/C: Mod A (lift or lower  assist);3: Chair or W/C > Bed: Mod A (lift or lower assist) FIM - Locomotion: Wheelchair Distance: 25 feet Locomotion: Wheelchair: 1: Travels less than 50 ft with maximal assistance (Pt: 25 - 49%) FIM - Locomotion: Ambulation Locomotion: Ambulation Assistive Devices: Other (comment) (without assistive device, hand in hand) Ambulation/Gait Assistance: 3: Mod assist Locomotion: Ambulation: 1: Travels less than 50 ft with moderate assistance (Pt: 50 - 74%) FIM - Locomotion: Stairs Locomotion: Building control surveyor: Hand rail - 2 Locomotion: Stairs: 1: Up and Down < 4 stairs with moderate assistance (Pt: 50 - 74%)   Refer to Care Plan for Long Term  Goals  Recommendations for other services: None  Discharge Criteria: Patient will be discharged from PT if patient refuses treatment 3 consecutive times without medical reason, if treatment goals not met, if there is a change in medical status, if patient makes no progress towards goals or if patient is discharged from hospital.  The above assessment, treatment plan, treatment alternatives and goals were discussed and mutually agreed upon: by patient  Rayford Halsted 01/05/2013, 3:57 PM

## 2013-01-06 ENCOUNTER — Inpatient Hospital Stay (HOSPITAL_COMMUNITY): Payer: Medicare Other | Admitting: *Deleted

## 2013-01-06 LAB — URINE CULTURE
Colony Count: NO GROWTH
Culture: NO GROWTH

## 2013-01-06 NOTE — Progress Notes (Signed)
Teaching for incentive s.. patient up to 500  Encouraging use

## 2013-01-06 NOTE — Progress Notes (Signed)
Subjective/Complaints: Feeling better. Denies pain. No sob or cough. Had bm yesterday A 12 point review of systems has been performed and if not noted above is otherwise negative.   Objective: Vital Signs: Blood pressure 121/82, pulse 97, temperature 98.1 F (36.7 C), temperature source Oral, resp. rate 16, height 5\' 2"  (1.575 m), weight 43.8 kg (96 lb 9 oz), SpO2 96.00%. Dg Chest 2 View  01/05/2013   CLINICAL DATA:  Pneumonia.  EXAM: CHEST  2 VIEW  COMPARISON:  01/04/2013  FINDINGS: Retrocardiac airspace opacity is improved. Right lower lobe airspace opacity is low minimally improved.  Mildly enlarged cardiopericardial silhouette, stable. Small bilateral pleural effusions. Mid thoracic compression fractures.  IMPRESSION: 1. Improved bibasilar airspace opacities compatible with improving pneumonia. Small bilateral pleural effusions persist. 2. Stable cardiomegaly.   Electronically Signed   By: [name]   On: 01/05/2013 08:15   Dg Chest 2 View  01/04/2013   CLINICAL DATA:  Fever.  Left lower rib pain.  EXAM: CHEST  2 VIEW  COMPARISON:  12/20/2012.  FINDINGS: Interval airspace opacity at both lung bases. Small bilateral pleural effusions. The lungs are mildly hyperexpanded. Stable enlarged cardiac silhouette. Diffuse osteopenia. No visible rib abnormality.  IMPRESSION: 1. Interval bibasilar pneumonia and small bilateral pleural effusions. 2. Stable cardiomegaly and mild changes of COPD.   Electronically Signed   By: Gordan Payment M.D.   On: 01/04/2013 20:38   No results found for this basename: WBC, HGB, HCT, PLT,  in the last 72 hours  Recent Labs  01/04/13 0540  CREATININE 1.20*   CBG (last 3)  No results found for this basename: GLUCAP,  in the last 72 hours  Wt Readings from Last 3 Encounters:  01/06/13 43.8 kg (96 lb 9 oz)  01/03/13 43.046 kg (94 lb 14.4 oz)    Physical Exam:  Nursing note and vitals reviewed.  Constitutional: She appears listless. She appears  cachectic. She is cooperative. No distress.  Frail appearing HENT:  Head: Normocephalic and atraumatic.  Eyes: Conjunctivae are normal. Pupils are equal, round, and reactive to light.  Neck: Normal range of motion. No JVD present. No tracheal deviation present. No thyromegaly present.  Cardiovascular: Normal rate, regular rhythm, S1 normal and S2 normal. Exam reveals gallop.  Respiratory: No respiratory distress. She has normal breath sounds. She has no wheezes. She has no rales.  GI: Soft. Bowel sounds are normal. She exhibits no distension. There is no rebound.  Musculoskeletal: Normal range of motion. She exhibits no edema. Minimal tenderness in LUQ, lower rib cage Lymphadenopathy:  She has no cervical adenopathy.  Neurological: She appears alert and appropriate. She exhibits normal muscle tone. Neuro exam stable Mild left central 7, minimal tongue deviation. Left delt, bic, tricep 4-, wrist/hand 4. Decreased FMC and mild ataxia of LUE. LLE 2HF, 3-KE, ankle 3+. RUE 4 to 4+, RLE 3+ prox to 4+ distally. No gross sensory findings. Has reasonable insight and awareness.  Skin: Skin is warm and dry. She is not diaphoretic.  Psychiatric: She has a normal mood and affect. Her behavior is normal. Judgment and thought content normal.    Assessment/Plan: 1. Functional deficits secondary to thrombotic right basal ganglia infarct which require 3+ hours per day of interdisciplinary therapy in a comprehensive inpatient rehab setting. Physiatrist is providing close team supervision and 24 hour management of active medical problems listed below. Physiatrist and rehab team continue to assess barriers to discharge/monitor patient progress toward functional and medical goals. FIM:  FIM - Toileting Toileting Assistive Devices: Grab bar or rail for support     FIM - Games developer Transfer: 3: Supine > Sit: Mod A (lifting assist/Pt. 50-74%/lift 2 legs;3: Sit > Supine: Mod A (lifting  assist/Pt. 50-74%/lift 2 legs);3: Bed > Chair or W/C: Mod A (lift or lower assist);3: Chair or W/C > Bed: Mod A (lift or lower assist)  FIM - Locomotion: Wheelchair Distance: 25 feet Locomotion: Wheelchair: 1: Travels less than 50 ft with maximal assistance (Pt: 25 - 49%) FIM - Locomotion: Ambulation Locomotion: Ambulation Assistive Devices: Other (comment) (without assistive device, hand in hand) Ambulation/Gait Assistance: 3: Mod assist Locomotion: Ambulation: 1: Travels less than 50 ft with moderate assistance (Pt: 50 - 74%)  Comprehension Comprehension Mode: Auditory Comprehension: 5-Understands basic 90% of the time/requires cueing < 10% of the time  Expression Expression Mode: Verbal Expression: 5-Expresses basic 90% of the time/requires cueing < 10% of the time.  Social Interaction Social Interaction: 4-Interacts appropriately 75 - 89% of the time - Needs redirection for appropriate language or to initiate interaction.  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 DVT Prophylaxis/Anticoagulation: Pharmaceutical: Lovenox  2. Pain Management: complains of left UQ pain, rib pain. ?recent fall --feeling better this am -heating pad  -prn tylenol  3. Mood: pt flat, but this largely due to fatigue, meds, medical issues, etc. Team to provide ego support as possible to pt. Seems pretty motivated, and is feeling "more like herself" today.  4. Neuropsych: This patient is capable of making decisions on her own behalf.  5. New onset seizure: Continue Keppra, dilantin, and vimpat. Recheck dilantin level next week.  6. HTN: Will monitor with bid checks.  7. Constipation: Will set bowel program. Suppository today, enema if needed 8. ?bibasilar pneumonia/ low  grade temp---I wasn't overly impressed with cxr read as pneumonia--afebrile   -Saturday's cxr with "improvement" already  -will continue abx for now  -encourage IS  -ua neg, ucx still pending  LOS (Days) 2 A FACE TO FACE EVALUATION WAS PERFORMED  SWARTZ,ZACHARY T 01/06/2013 8:04 AM

## 2013-01-06 NOTE — Progress Notes (Signed)
Occupational Therapy Note  Patient Details  Name: Aveen Stansel MRN: [id number] Date of Birth: 11-11-1940 Today's Date: 01/06/2013  Time:  1245-1345  (60 min) Pain:left arm aches; no pain number given Individual session  Ambulated to shower and transferred with minimal assist.  Bathed entire body but needed minimal assist for standing balance while washing peri area.  Pt. Needed no assist with either UE during bathing.  Stood for 3-4 minutes while bathing with one UE holding to bar and OT close supervision.   Ambulated out to sink and dressed.  Needed cues to don LUE into sleeve; which she completely left it out of shirt.  Took increased time at end of session to don pants due to fatigue  (mod assist).  Pt transferred to bed with min assist.  Did lateral scoots to head of bed with minimal assist and went from sit to supine with mod assist.  Remained in bed with bed alarm on and call bell,phone within reach.  Daughter, Mallory Shirk in room.  She reported that pt would live some with her and some with her brother after d/c.       Humberto Seals 01/06/2013, 2:00 PM

## 2013-01-07 ENCOUNTER — Inpatient Hospital Stay (HOSPITAL_COMMUNITY): Payer: Medicare Other | Admitting: Physical Therapy

## 2013-01-07 ENCOUNTER — Inpatient Hospital Stay (HOSPITAL_COMMUNITY): Payer: Medicare Other

## 2013-01-07 DIAGNOSIS — I6350 Cerebral infarction due to unspecified occlusion or stenosis of unspecified cerebral artery: Secondary | ICD-10-CM

## 2013-01-07 DIAGNOSIS — I10 Essential (primary) hypertension: Secondary | ICD-10-CM

## 2013-01-07 LAB — COMPREHENSIVE METABOLIC PANEL
ALT: 23 U/L (ref 0–35)
AST: 29 U/L (ref 0–37)
Albumin: 2.8 g/dL — ABNORMAL LOW (ref 3.5–5.2)
Alkaline Phosphatase: 87 U/L (ref 39–117)
BUN: 19 mg/dL (ref 6–23)
CO2: 21 mEq/L (ref 19–32)
Calcium: 9 mg/dL (ref 8.4–10.5)
Chloride: 101 mEq/L (ref 96–112)
Creatinine, Ser: 1.36 mg/dL — ABNORMAL HIGH (ref 0.50–1.10)
GFR calc Af Amer: 45 mL/min — ABNORMAL LOW (ref >90)
GFR calc non Af Amer: 38 mL/min — ABNORMAL LOW (ref >90)
Glucose, Bld: 101 mg/dL — ABNORMAL HIGH (ref 70–99)
Potassium: 4.4 mEq/L (ref 3.5–5.1)
Sodium: 135 mEq/L (ref 135–145)
Total Bilirubin: 0.3 mg/dL (ref 0.3–1.2)
Total Protein: 6.8 g/dL (ref 6.0–8.3)

## 2013-01-07 LAB — CBC WITH DIFFERENTIAL/PLATELET
Basophils Absolute: 0 10*3/uL (ref 0.0–0.1)
Basophils Relative: 0 % (ref 0–1)
Eosinophils Absolute: 0.1 10*3/uL (ref 0.0–0.7)
Eosinophils Relative: 2 % (ref 0–5)
HCT: 36.1 % (ref 36.0–46.0)
Hemoglobin: 12.4 g/dL (ref 12.0–15.0)
Lymphocytes Relative: 20 % (ref 12–46)
Lymphs Abs: 1.3 10*3/uL (ref 0.7–4.0)
MCH: 32.7 pg (ref 26.0–34.0)
MCHC: 34.3 g/dL (ref 30.0–36.0)
MCV: 95.3 fL (ref 78.0–100.0)
Monocytes Absolute: 0.8 10*3/uL (ref 0.1–1.0)
Monocytes Relative: 12 % (ref 3–12)
Neutro Abs: 4.4 10*3/uL (ref 1.7–7.7)
Neutrophils Relative %: 66 % (ref 43–77)
Platelets: 457 10*3/uL — ABNORMAL HIGH (ref 150–400)
RBC: 3.79 MIL/uL — ABNORMAL LOW (ref 3.87–5.11)
RDW: 15.5 % (ref 11.5–15.5)
WBC: 6.7 10*3/uL (ref 4.0–10.5)

## 2013-01-07 MED ORDER — MUSCLE RUB 10-15 % EX CREA
TOPICAL_CREAM | CUTANEOUS | Status: DC | PRN
  Filled 2013-01-07: qty 85

## 2013-01-07 MED ORDER — TROLAMINE SALICYLATE 10 % EX CREA
TOPICAL_CREAM | Freq: Two times a day (BID) | CUTANEOUS | Status: DC | PRN
Start: 2013-01-07 — End: 2013-01-07

## 2013-01-07 NOTE — Progress Notes (Signed)
Social Work Assessment and Plan Social Work Assessment and Plan  Patient Details  Name: [name] MRN: [id number] Date of Birth: August 29, 1940  Today's Date: 01/07/2013  Problem List:  Patient Active Problem List   Diagnosis Date Noted  . CVA (cerebral infarction) 01/04/2013  . Generalized seizure 12/28/2012  . Hypertension 12/28/2012  . Seizure 12/28/2012  . Metabolic acidosis 12/28/2012  . Encephalopathy 12/28/2012  . Acute renal failure 12/28/2012   Past Medical History:  Past Medical History  Diagnosis Date  . MI (myocardial infarction)   . Hypertension    Past Surgical History: No past surgical history on file. Social History:  reports that she has never smoked. She does not have any smokeless tobacco history on file. She reports that she does not drink alcohol or use illicit drugs.  Family / Support Systems Marital Status: Single Patient Roles: Parent Children: Sterling Big [phone] Other Supports: Fonnie Mu  [phone]  [phone] Anticipated Caregiver: Son and daughter Ability/Limitations of Caregiver: They between themselves will make sure pt has 24 hour care Caregiver Availability: 24/7 Family Dynamics: Close knit family who will work around their schedules to make sure pt has what she needs.  They are very caring about their Mother and will make sure she ahs what she needs.  Very dedicated and committed to her.  Social History Preferred language: English Religion: Methodist Cultural Background: No issues Education: [employer] Read: Yes Write: Yes Employment Status: Retired Fish farm manager Issues: No issues Guardian/Conservator: None-according to MD pt is capable of making her own decisions while here.  Her children have been here with her since she came into the hospital   Abuse/Neglect Physical Abuse: Denies Verbal Abuse: Denies Sexual Abuse: Denies Exploitation of patient/patient's resources:  Denies Self-Neglect: Denies  Emotional Status Pt's affect, behavior adn adjustment status: Pt is motivated and makes jokes about being in the hospital.  She finds humor in life according to her children this is not new.  She finds the positive in every situation.  She is motivated to improve and wants to do well here.  her children are very supportive and will asssit if needed Recent Psychosocial Issues: Other medical issues-was healthy according to pt Pyschiatric History: No issues or history deferred depression screeen due to pt feels she is coping well and will let me know if something changes.  Will monitor her while here and see if need to intervene.She does appear to be coping appropriately. Substance Abuse History: No issues  Patient / Family Perceptions, Expectations & Goals Pt/Family understanding of illness & functional limitations: Pt and children have a good understanding of her stroke and deficits.  They have been here to observe and talk with MD so feel have a good understanidng of Mom's treatment plan.  They will try to have someone here with her while she is here. 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 referrals recommended: Support group (specify) (CVA Support group)  Discharge Planning Living Arrangements: Alone Support Systems: Children;Other relatives;Friends/neighbors;Church/faith community Type of Residence: Private residence Insurance Resources: Media planner (specify)  Investment banker, operational) Financial Resources: Restaurant manager, fast food  Screen Referred: No Living Expenses: Rent Money Management: Patient Does the patient have any problems obtaining your medications?: No Home Management: Self Patient/Family Preliminary Plans: Plans to go to [place] where her son and daughter live then hopefully come back home when ready.  Children are very supportive and involved.  Daughter to take FMLA to be with at discharge. Social Work Anticipated Follow Up Needs: HH/OP;Support Group  Clinical Impression Pleasant funny lady who was making jokes about her bruised arms and getting caught getting up unattended.  Her son and daughter are here and supportive and will be involved in her discharge. Plan is to go to daughter's house in [place] when discharged, son and daughter in-law also live in [place].  Will work toward discharge.  Lucy Chris 01/07/2013, 1:36 PM

## 2013-01-07 NOTE — Plan of Care (Signed)
Problem: RH SAFETY Goal: RH STG ADHERE TO SAFETY PRECAUTIONS W/ASSISTANCE/DEVICE STG Adhere to Safety Precautions With Assistance/Device. Supervision  Outcome: Progressing No unsafe behavior noted     

## 2013-01-07 NOTE — Progress Notes (Signed)
Using IS when reminded. Only getting to 500 and needs reinforcement. Had BM this am. Continues to c/o RLQ pain that she states she has had since Friday. Pt states moved from left side. Medicated and heat used as needed. Cont to monitor.

## 2013-01-07 NOTE — Plan of Care (Signed)
Problem: RH SKIN INTEGRITY Goal: RH STG SKIN FREE OF INFECTION/BREAKDOWN Outcome: Progressing Fissure on admission. Applying protective measures

## 2013-01-07 NOTE — Plan of Care (Signed)
Problem: RH BOWEL ELIMINATION Goal: RH STG MANAGE BOWEL W/MEDICATION W/ASSISTANCE STG Manage Bowel with Medication with Assistance. Mod I Outcome: Progressing No incontinent episode reported     

## 2013-01-07 NOTE — Progress Notes (Signed)
Physical Therapy Session Note  Patient Details  Name: [name] MRN: [id number] Date of Birth: [birth year]  Today's Date: 01/07/2013 Time: 1130-1200 Time Calculation (min): 30 min   Skilled Therapeutic Interventions/Progress Updates:    Pt c/o R hip pain, RN aware.  Gait with RW with close supervision/min guard 175' x 2.  Pt c/o fatigue but able to participate with multiple rest breaks.  BERG balance test performed, pt scored 26/56, high risk for falls. Pt understands importance of using AD at this time for safety and balance.  Therapy Documentation  Balance: Standardized Balance Assessment Standardized Balance Assessment: Berg Balance Test Berg Balance Test Sit to Stand: Able to stand using hands after several tries Standing Unsupported: Able to stand 2 minutes with supervision Sitting with Back Unsupported but Feet Supported on Floor or Stool: Able to sit safely and securely 2 minutes Stand to Sit: Uses backs of legs against chair to control descent Transfers: Able to transfer with verbal cueing and /or supervision Standing Unsupported with Eyes Closed: Able to stand 10 seconds with supervision Standing Ubsupported with Feet Together: Needs help to attain position but able to stand for 30 seconds with feet together From Standing, Reach Forward with Outstretched Arm: Reaches forward but needs supervision From Standing Position, Pick up Object from Floor: Able to pick up shoe, needs supervision From Standing Position, Turn to Look Behind Over each Shoulder: Turn sideways only but maintains balance Turn 360 Degrees: Needs close supervision or verbal cueing Standing Unsupported, Alternately Place Feet on Step/Stool: Able to complete >2 steps/needs minimal assist Standing Unsupported, One Foot in Front: Needs help to step but can hold 15 seconds Standing on One Leg: Unable to try or needs assist to prevent fall Total Score: 26  See FIM for current functional status  Therapy/Group:  Individual Therapy  Tishanna Dunford 01/07/2013, 1:53 PM

## 2013-01-07 NOTE — Progress Notes (Signed)
Subjective/Complaints: Stomach pain, urinary freq but no dysuria. No prior problems like this at home A 12 point review of systems has been performed and if not noted above is otherwise negative.    Objective: Vital Signs: Blood pressure 134/91, pulse 91, temperature 97.8 F (36.6 C), temperature source Oral, resp. rate 18, height 5\' 2"  (1.575 m), weight 43.4 kg (95 lb 10.9 oz), SpO2 94.00%. Dg Chest 2 View  01/05/2013   CLINICAL DATA:  Pneumonia.  EXAM: CHEST  2 VIEW  COMPARISON:  01/04/2013  FINDINGS: Retrocardiac airspace opacity is improved. Right lower lobe airspace opacity is low minimally improved.  Mildly enlarged cardiopericardial silhouette, stable. Small bilateral pleural effusions. Mid thoracic compression fractures.  IMPRESSION: 1. Improved bibasilar airspace opacities compatible with improving pneumonia. Small bilateral pleural effusions persist. 2. Stable cardiomegaly.   Electronically Signed   By: [name]   On: 01/05/2013 08:15    Recent Labs  01/07/13 0540  WBC 6.7  HGB 12.4  HCT 36.1  PLT 457*   No results found for this basename: NA, K, CL, CO, GLUCOSE, BUN, CREATININE, CALCIUM,  in the last 72 hours CBG (last 3)  No results found for this basename: GLUCAP,  in the last 72 hours  Wt Readings from Last 3 Encounters:  01/07/13 43.4 kg (95 lb 10.9 oz)  01/03/13 43.046 kg (94 lb 14.4 oz)    Physical Exam:  Nursing note and vitals reviewed.  Constitutional: She appears listless. She appears cachectic. She is cooperative. No distress.  Frail appearing HENT:  Head: Normocephalic and atraumatic.  Eyes: Conjunctivae are normal. Pupils are equal, round, and reactive to light.  Neck: Normal range of motion. No JVD present. No tracheal deviation present. No thyromegaly present.  Cardiovascular: Normal rate, regular rhythm, S1 normal and S2 normal. Exam reveals gallop.  Respiratory: No respiratory distress. Lungs clear She has no wheezes. She has no  rales.  GI: Soft. Bowel sounds are normal. She exhibits no distension. There is no rebound. NT Musculoskeletal: Normal range of motion. She exhibits no edema. Mild tenderness in LUQ, lower rib cage Lymphadenopathy:  She has no cervical adenopathy.  Neurological: She appears listless. She exhibits normal muscle tone. Neuro exam stable Mild left central 7, minimal tongue deviation. Left delt, bic, tricep 4-, wrist/hand 4. Decreased FMC and mild ataxia of LUE. LLE 2HF, 3-KE, ankle 3+. RUE 4 to 4+, RLE 3+ prox to 4+ distally. No gross sensory findings. Has reasonable insight and awareness.  Skin: Skin is warm and dry. She is not diaphoretic.  Psychiatric: She has a normal mood and affect. Her behavior is normal. Judgment and thought content normal.  Tenderness left side low back  Assessment/Plan: 1. Functional deficits secondary to thrombotic right basal ganglia infarct which require 3+ hours per day of interdisciplinary therapy in a comprehensive inpatient rehab setting. Physiatrist is providing close team supervision and 24 hour management of active medical problems listed below. Physiatrist and rehab team continue to assess barriers to discharge/monitor patient progress toward functional and medical goals. FIM: FIM - Bathing Bathing Steps Patient Completed: Chest;Right Arm;Left Arm;Abdomen;Front perineal area;Buttocks;Right upper leg;Left upper leg Bathing: 4: Min-Patient completes 8-9 39f 10 parts or 75+ percent  FIM - Upper Body Dressing/Undressing Upper body dressing/undressing steps patient completed: Thread/unthread right sleeve of pullover shirt/dresss;Put head through opening of pull over shirt/dress;Pull shirt over trunk Upper body dressing/undressing: 4: Min-Patient completed 75 plus % of tasks FIM - Lower Body Dressing/Undressing Lower body dressing/undressing steps patient completed: Thread/unthread  left pants leg;Pull pants up/down Lower body dressing/undressing: 2: Max-Patient  completed 25-49% of tasks  FIM - Hotel manager Devices: Grab bar or rail for support     FIM - Banker Devices: Bed rails Bed/Chair Transfer: 4: Chair or W/C > Bed: Min A (steadying Pt. > 75%);3: Sit > Supine: Mod A (lifting assist/Pt. 50-74%/lift 2 legs)  FIM - Locomotion: Wheelchair Distance: 25 feet Locomotion: Wheelchair: 1: Travels less than 50 ft with maximal assistance (Pt: 25 - 49%) FIM - Locomotion: Ambulation Locomotion: Ambulation Assistive Devices: Other (comment) (without assistive device, hand in hand) Ambulation/Gait Assistance: 3: Mod assist Locomotion: Ambulation: 1: Travels less than 50 ft with moderate assistance (Pt: 50 - 74%)  Comprehension Comprehension Mode: Auditory Comprehension: 5-Follows basic conversation/direction: With extra time/assistive device  Expression Expression Mode: Verbal Expression: 5-Expresses basic 90% of the time/requires cueing < 10% of the time.  Social Interaction Social Interaction: 4-Interacts appropriately 75 - 89% of the time - Needs redirection for appropriate language or to initiate interaction.  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 DVT Prophylaxis/Anticoagulation: Pharmaceutical: Lovenox  2. Pain Management: complains of left UQ pain, rib pain. ?recent fall  -heating pad  -prn tylenol  3. Mood: pt flat, but this largely due to fatigue, meds, medical issues, etc. Team to provide ego support as possible to pt. Seems pretty motivated, and is feeling "more like herself" today.  4. Neuropsych: This patient is capable of making decisions on her own behalf.  5. New onset seizure: Continue Keppra, dilantin, and vimpat. Recheck dilantin  level next week.  6. HTN: Will monitor with bid checks.  7. Constipation: Will set bowel program. Suppository today, enema if needed 8. LBP Kpad and sports cream mobilize  LOS (Days) 3 A FACE TO FACE EVALUATION WAS PERFORMED  [name] 01/07/2013 7:09 AM

## 2013-01-07 NOTE — Plan of Care (Signed)
Problem: RH BOWEL ELIMINATION Goal: RH STG MANAGE BOWEL WITH ASSISTANCE STG Manage Bowel with Assistance. Mod I  Outcome: Progressing No incontinent episode reported this shift

## 2013-01-07 NOTE — IPOC Note (Signed)
Overall Plan of Care [facility]) Patient Details Name: [name] MRN: [id number] DOB: [birth year]  Admitting Diagnosis: CVA  Hospital Problems: Principal Problem:   CVA (cerebral infarction) Active Problems:   Hypertension   Seizure   Acute renal failure     Functional Problem List: Nursing Bladder;Pain;Safety;Bowel  PT Balance;Endurance;Motor;Safety;Perception  OT Balance;Cognition;Endurance;Motor;Perception;Safety;Skin Integrity  SLP    TR         Basic ADL's: OT Grooming;Bathing;Dressing;Toileting     Advanced  ADL's: OT Simple Meal Preparation     Transfers: PT Bed Mobility;Bed to Chair;Car  OT Toilet;Tub/Shower     Locomotion: PT Ambulation;Stairs     Additional Impairments: OT Other (comment) (decreased UE strength)  SLP        TR      Anticipated Outcomes Item Anticipated Outcome  Self Feeding    Swallowing      Basic self-care  supervision  Toileting  supervision   Bathroom Transfers supervision  Bowel/Bladder  Mod I  Transfers  S  Locomotion  S for ambulation with LRAD, min guard for stairs  Communication     Cognition     Pain  <3  Safety/Judgment  Supervision   Therapy Plan: PT Intensity: Minimum of 1-2 x/day ,45 to 90 minutes PT Frequency: 5 out of 7 days PT Duration Estimated Length of Stay: 8 to 10 days OT Intensity: Minimum of 1-2 x/day, 45 to 90 minutes OT Frequency: 5 out of 7 days OT Duration/Estimated Length of Stay: 8-10 daysdays         Team Interventions: Nursing Interventions Bladder Management;Bowel Management;Skin Care/Wound Management;Pain Management;Disease Management/Prevention  PT interventions Balance/vestibular training;Ambulation/gait training;Discharge planning;DME/adaptive equipment instruction;Functional mobility training;Patient/family education;Neuromuscular re-education;Splinting/orthotics;Stair training;UE/LE Strength taining/ROM;UE/LE Coordination activities;Therapeutic Activities;Therapeutic  Exercise  OT Interventions Balance/vestibular training;Cognitive remediation/compensation;Community reintegration;Discharge planning;DME/adaptive equipment instruction;Functional mobility training;Neuromuscular re-education;Patient/family education;Psychosocial support;Self Care/advanced ADL retraining;Therapeutic Activities;Therapeutic Exercise;UE/LE Strength taining/ROM  SLP Interventions    TR Interventions    SW/CM Interventions      Team Discharge Planning: Destination: PT-Home ,OT- Home , SLP-  Projected Follow-up: PT-Home health PT, OT-  Skilled nursing facility;None;24 hour supervision/assistance, SLP-  Projected Equipment Needs: PT-Rolling walker with 5" wheels;Wheelchair (measurements), OT- To be determined, SLP-  Equipment Details: PT- , OT-  Patient/family involved in discharge planning: PT- Patient;Family member/caregiver,  OT-Patient, SLP-   MD ELOS: 7-9 Medical Rehab Prognosis:  Excellent Assessment: 72 y.o. right-handed female with history of CVA, hypertension as well as myocardial infarction maintained on aspirin therapy. Independent prior to admission living alone and driving. Admitted 12/28/2012 with altered mental status as well as seizure. MRI of the brain showed acute 1 cm right basal ganglia infarction as well as chronic hemorrhage left temporal region. Echocardiogram with ejection fraction of 40% with diffuse hypokinesis. Carotid Dopplers with no ICA stenosis. EEG consistent with a potential area for epileptogenicity in the left parietal-occipital region. Patient loaded with Keppra/Dilantin as well as Vimpat. She has had issues with ileus and constipation --no results with enema. Dilantin dose decreased due to elevated levels  Now requiring 24/7 Rehab RN,MD, as well as CIR level PT, OT and SLP.  Treatment team will focus on ADLs and mobility with goals set at Sup    See Team Conference Notes for weekly updates to the plan of care

## 2013-01-07 NOTE — Care Management Note (Signed)
[facility]patient [facility] Individual Statement of Services  Patient Name:  [name]  Date:  01/07/2013  Welcome to the [facility]patient [facility].  Our goal is to provide you with an individualized program based on your diagnosis and situation, designed to meet your specific needs.  With this comprehensive rehabilitation program, you will be expected to participate in at least 3 hours of rehabilitation therapies Monday-Friday, with modified therapy programming on the weekends.  Your rehabilitation program will include the following services:  Physical Therapy (PT), Occupational Therapy (OT), Speech Therapy (ST), 24 hour per day rehabilitation nursing, Case Management (Social Worker), Rehabilitation Medicine, Nutrition Services and Pharmacy Services  Weekly team conferences will be held on Wednesday to discuss your progress.  Your Social Worker will talk with you frequently to get your input and to update you on team discussions.  Team conferences with you and your family in attendance may also be held.  Expected length of stay: 8-10 days Overall anticipated outcome: supervision with set up  Depending on your progress and recovery, your program may change. Your Social Worker will coordinate services and will keep you informed of any changes. Your Social Worker's name and contact numbers are listed  below.  The following services may also be recommended but are not provided by the Inpatient Rehabilitation Center:   Driving Evaluations  Home Health Rehabiltiation Services  Outpatient Rehabilitation Services    Arrangements will be made to provide these services after discharge if needed.  Arrangements include referral to agencies that provide these services.  Your insurance has been verified to be:  [employer]-Medicare Your primary doctor is:  DR. Renato Gails  Pertinent information will be shared with your doctor and your insurance company.  Social Worker:  Dossie Der, [phone]  or [phone]  Information discussed with and copy given to patient by: Lucy Chris, 01/07/2013, 1:20 PM

## 2013-01-07 NOTE — Evaluation (Signed)
Speech Language Pathology Assessment and Plan  Patient Details  Name: [name] MRN: [id number] Date of Birth: 1940-11-23  SLP Diagnosis: Cognitive Impairments  Rehab Potential: Good ELOS: 7-10 days   Today's Date: 01/07/2013 Time: 7846-9629 Time Calculation (min): 60 min  Problem List:  Patient Active Problem List   Diagnosis Date Noted  . CVA (cerebral infarction) 01/04/2013  . Generalized seizure 12/28/2012  . Hypertension 12/28/2012  . Seizure 12/28/2012  . Metabolic acidosis 12/28/2012  . Encephalopathy 12/28/2012  . Acute renal failure 12/28/2012   Past Medical History:  Past Medical History  Diagnosis Date  . MI (myocardial infarction)   . Hypertension    Past Surgical History: No past surgical history on file.  Assessment / Plan / Recommendation Clinical Impression  Pt is a 72 y.o. right-handed female with history of CVA, hypertension as well as myocardial infarction maintained on aspirin therapy. Independent prior to admission living alone and driving. Admitted 12/28/2012 with altered mental status as well as seizure. MRI of the brain showed acute 1 cm right basal ganglia infarction as well as chronic hemorrhage left temporal region. Echocardiogram with ejection fraction of 40% with diffuse hypokinesis. Carotid Dopplers with no ICA stenosis. EEG consistent with a potential area for epileptogenicity in the left parietal-occipital region. Patient loaded with Keppra/Dilantin as well as Vimpat. She has had issues with ileus and constipation --no results with enema. Dilantin dose decreased due to elevated levels. Patient with right sided weakness with balance deficits as well as BLE instability. Therapy reports cognitive deficits with decreased memory and delayed processing. Pt was admitted to CIR 12/5 with SLP cognitive-linguistic eval completed 12/8. Pt demonstrated decreased intellectual awareness, recall of new information, and basic problem solving as well as mildly  delayed processing time. Speech was intelligible at the conversational level and family reports that it is returned to baseline. Pt will benefit from SLP services to maximize functional independence prior to discharge home.    SLP Assessment  Patient will need skilled Speech Lanaguage Pathology Services during CIR admission    Recommendations  Patient destination: Home Follow up Recommendations: Home Health SLP;24 hour supervision/assistance Equipment Recommended: None recommended by SLP    SLP Frequency 5 out of 7 days   SLP Treatment/Interventions Cognitive remediation/compensation;Cueing hierarchy;Environmental controls;Functional tasks;Internal/external aids;Medication managment;Patient/family education    Pain Pain Assessment Pain Assessment: 0-10 Pain Score: 9  Pain Type: Acute pain Pain Location: Hip Pain Orientation: Right Pain Descriptors / Indicators: Aching Pain Frequency: Intermittent Pain Onset: Gradual Patients Stated Pain Goal: 3 Pain Intervention(s): Medication (See eMAR);Repositioned;Emotional support Multiple Pain Sites: No Prior Functioning Cognitive/Linguistic Baseline: Within functional limits Type of Home: Apartment  Lives With: Alone Available Help at Discharge: Family;Available 24 hours/day Vocation: Retired  Teacher, music Term Goals: Week 1: SLP Short Term Goal 1 (Week 1): Pt will demonstrate intellectual awareness of at least 1 cognitive and 1 physical impairment with MIn cues SLP Short Term Goal 2 (Week 1): Pt will demonstrate adequate medication management with use of external aid with Min cues SLP Short Term Goal 3 (Week 1): Pt will demonstrate basic problem solving during functional task with Min cues SLP Short Term Goal 4 (Week 1): Pt will utilize external memory aids to recall new and daily information with Min cues  See FIM for current functional status Refer to Care Plan for Long Term Goals  Recommendations for other services: None  Discharge  Criteria: Patient will be discharged from SLP if patient refuses treatment 3 consecutive times without medical reason, if  treatment goals not met, if there is a change in medical status, if patient makes no progress towards goals or if patient is discharged from hospital.  The above assessment, treatment plan, treatment alternatives and goals were discussed and mutually agreed upon: by patient and by family   Maxcine Ham, M.A. CCC-SLP [phone]   Maxcine Ham 01/07/2013, 4:23 PM

## 2013-01-07 NOTE — Progress Notes (Addendum)
Physical Therapy Note  Patient Details  Name: [name] MRN: [id number] Date of Birth: December 15, 1940 Today's Date: 01/07/2013 Pain R hip rated 9/10;  RN dispensed med during tx  [id number], 40 min individual therapy  Bed>< w/c with min assist stand pivot.  Scooting laterally on bed with VCs; supine > sit with mod assist for L LE and trunk.  neuromuscular re-education via demo, VCS, visual cues for bil hand use for w/c propulsion, alternating knee ext in sitting, and bil hand use to lock/unlock brakes of w/c.  Pt demonstrated R inattention and delayed processing,  encountering obstacles on R when propelling.  Pt unable to attempt gait due to R hip pain.  Pt requested going back to bed at end of session.  Positioned pt in supine with R hip supported, pillow under bil calves for comfort.  All needs placed within reach and bed alarm set.  See FIM for details  Aranza Geddes 01/07/2013, 1:50 PM

## 2013-01-07 NOTE — Progress Notes (Signed)
Occupational Therapy Session Note  Patient Details  Name: [name] MRN: [id number] Date of Birth: [birth year]  Today's Date: 01/07/2013 Time: 1030-1130 Time Calculation (min): 60 min  Short Term Goals: Week 1:  OT Short Term Goal 1 (Week 1): Pt. will be supervion with bathing OT Short Term Goal 2 (Week 1): Pt. will maintained staning balance during bathing sith superviswion OT Short Term Goal 3 (Week 1): Pt. will dress LB with minimal assist OT Short Term Goal 4 (Week 1): Pt. will transfer to toilet with supervision  Skilled Therapeutic Interventions:   ADL-retraining with emphasis on sustained standing balance during bathing, improved lower body dressing, and improved performance with toileting.   Patient was greeted with her daughter at her bedside while she remained supine in bed.   Patient was oriented to task of bathing and dressing.  With min cues patient transferred from bed to toilet using RW with steadying assist.   Patient required min assist with toileting for thoroughness with hygiene after BM but completed bathing with only supervision assist to manage shower head while she alternated standing and sitting  to bathe upper and lower body.   Patient required steadying assist during standing and hand guidance to locate grab bar.   Patient ambulated back to bed to dress at edge of bed but her endurance waned after donning t-shirt and pants and therefore she required mod assist to don socks and shoes.  Patient recovered while resting at edge of bed and complied to direction to ambulate using RW from her room to RN station and back.  Patient completed task with close supervision and verbal cues for direction.   Patient left in her room with daughter present awaiting planned physical therapy session to follow.  Therapy Documentation Precautions:  Precautions Precautions: Fall Precaution Comments: right hemiparesis Restrictions Weight Bearing Restrictions: No  Pain: Pain  Assessment Pain Score: 0-No pain  ADL:  See FIM for current functional status  Therapy/Group: Individual Therapy  Yerik Zeringue 01/07/2013, 1:05 PM

## 2013-01-07 NOTE — Progress Notes (Signed)
Patient information reviewed and entered into eRehab system by Icelyn Navarrete, RN, CRRN, PPS Coordinator.  Information including medical coding and functional independence measure will be reviewed and updated through discharge.     Per nursing patient was given "Data Collection Information Summary for Patients in Inpatient Rehabilitation Facilities with attached "Privacy Act Statement-Health Care Records" upon admission.  

## 2013-01-08 ENCOUNTER — Inpatient Hospital Stay (HOSPITAL_COMMUNITY): Payer: Medicare Other | Admitting: *Deleted

## 2013-01-08 ENCOUNTER — Inpatient Hospital Stay (HOSPITAL_COMMUNITY): Payer: Medicare Other

## 2013-01-08 ENCOUNTER — Inpatient Hospital Stay (HOSPITAL_COMMUNITY): Payer: Medicare Other | Admitting: Occupational Therapy

## 2013-01-08 DIAGNOSIS — I639 Cerebral infarction, unspecified: Secondary | ICD-10-CM

## 2013-01-08 DIAGNOSIS — I10 Essential (primary) hypertension: Secondary | ICD-10-CM

## 2013-01-08 DIAGNOSIS — I6381 Other cerebral infarction due to occlusion or stenosis of small artery: Secondary | ICD-10-CM

## 2013-01-08 DIAGNOSIS — I6350 Cerebral infarction due to unspecified occlusion or stenosis of unspecified cerebral artery: Secondary | ICD-10-CM

## 2013-01-08 HISTORY — DX: Other cerebral infarction due to occlusion or stenosis of small artery: I63.81

## 2013-01-08 HISTORY — DX: Cerebral infarction, unspecified: I63.9

## 2013-01-08 LAB — PHENYTOIN LEVEL, TOTAL: Phenytoin Lvl: 2.5 ug/mL — ABNORMAL LOW (ref 10.0–20.0)

## 2013-01-08 NOTE — Progress Notes (Signed)
Subjective/Complaints: Slept ok no c/os Son from [place] coming in today or tomorrow No prior problems like this at home A 12 point review of systems has been performed and if not noted above is otherwise negative.    Objective: Vital Signs: Blood pressure 126/82, pulse 88, temperature 98.1 F (36.7 C), temperature source Oral, resp. rate 16, height 5\' 2"  (1.575 m), weight 45.1 kg (99 lb 6.8 oz), SpO2 100.00%. No results found.  Recent Labs  01/07/13 0540  WBC 6.7  HGB 12.4  HCT 36.1  PLT 457*    Recent Labs  01/07/13 0540  NA 135  K 4.4  CL 101  GLUCOSE 101*  BUN 19  CREATININE 1.36*  CALCIUM 9.0   CBG (last 3)  No results found for this basename: GLUCAP,  in the last 72 hours  Wt Readings from Last 3 Encounters:  01/08/13 45.1 kg (99 lb 6.8 oz)  01/03/13 43.046 kg (94 lb 14.4 oz)    Physical Exam:  Nursing note and vitals reviewed.  Constitutional: She appears listless. She appears cachectic. She is cooperative. No distress.  Frail appearing HENT:  Head: Normocephalic and atraumatic.  Eyes: Conjunctivae are normal. Pupils are equal, round, and reactive to light.  Neck: Normal range of motion. No JVD present. No tracheal deviation present. No thyromegaly present.  Cardiovascular: Normal rate, regular rhythm, S1 normal and S2 normal. Exam reveals gallop.  Respiratory: No respiratory distress. Lungs clear She has no wheezes. She has no rales.  GI: Soft. Bowel sounds are normal. She exhibits no distension. There is no rebound. NT Musculoskeletal: Normal range of motion. She exhibits no edema. Mild tenderness in LUQ, lower rib cage Lymphadenopathy:  She has no cervical adenopathy.  Neurological: She appears listless. She exhibits normal muscle tone. Neuro exam stable Mild left central 7, minimal tongue deviation. Left delt, bic, tricep 4-, wrist/hand 4. Decreased FMC and mild ataxia of LUE. LLE 4HF, 4-KE, ankle 3+. RUE 4 to 4+, RLE 4+ prox and 4+ distally. No  gross sensory findings. Has reasonable insight and awareness.  Skin: Skin is warm and dry. She is not diaphoretic.  Psychiatric: She has a normal mood and affect. Her behavior is normal. Judgment and thought content normal.  Tenderness left side low back  Assessment/Plan: 1. Functional deficits secondary to thrombotic right basal ganglia infarct which require 3+ hours per day of interdisciplinary therapy in a comprehensive inpatient rehab setting. Physiatrist is providing close team supervision and 24 hour management of active medical problems listed below. Physiatrist and rehab team continue to assess barriers to discharge/monitor patient progress toward functional and medical goals. FIM: FIM - Bathing Bathing Steps Patient Completed: Chest;Right Arm;Left Arm;Abdomen;Front perineal area;Buttocks;Right upper leg;Left upper leg Bathing: 4: Min-Patient completes 8-9 38f 10 parts or 75+ percent  FIM - Upper Body Dressing/Undressing Upper body dressing/undressing steps patient completed: Thread/unthread right sleeve of pullover shirt/dresss;Put head through opening of pull over shirt/dress;Pull shirt over trunk Upper body dressing/undressing: 4: Min-Patient completed 75 plus % of tasks FIM - Lower Body Dressing/Undressing Lower body dressing/undressing steps patient completed: Thread/unthread left pants leg;Pull pants up/down Lower body dressing/undressing: 2: Max-Patient completed 25-49% of tasks  FIM - Hotel manager Devices: Grab bar or rail for support     FIM - Banker Devices: Bed rails Bed/Chair Transfer: 3: Supine > Sit: Mod A (lifting assist/Pt. 50-74%/lift 2 legs;4: Sit > Supine: Min A (steadying pt. > 75%/lift 1 leg)  FIM - Locomotion: Wheelchair Distance: 75  Locomotion: Wheelchair: 2: Travels 50 - 149 ft with minimal assistance (Pt.>75%) FIM - Locomotion: Ambulation Locomotion: Ambulation Assistive Devices: Other  (comment) (without assistive device, hand in hand) Ambulation/Gait Assistance: 3: Mod assist Locomotion: Ambulation: 0: Activity did not occur  Comprehension Comprehension Mode: Auditory Comprehension: 5-Understands complex 90% of the time/Cues < 10% of the time  Expression Expression Mode: Verbal Expression: 5-Expresses complex 90% of the time/cues < 10% of the time  Social Interaction Social Interaction: 4-Interacts appropriately 75 - 89% of the time - Needs redirection for appropriate language or to initiate interaction.  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 DVT Prophylaxis/Anticoagulation: Pharmaceutical: Lovenox  2. Pain Management: complains of left UQ pain, rib pain. ?recent fall  -heating pad  -prn tylenol  3. Mood: pt flat, but this largely due to fatigue, meds, medical issues, etc. Team to provide ego support as possible to pt. Seems pretty motivated, and is feeling "more like herself" today.  4. Neuropsych: This patient is capable of making decisions on her own behalf.  5. New onset seizure: Continue Keppra, dilantin, and vimpat. Recheck dilantin level  6. HTN: Will monitor with bid checks.  7. Constipation: Will set bowel program. Suppository today, enema if needed 8. LBP Kpad and sports cream mobilize  LOS (Days) 4 A FACE TO FACE EVALUATION WAS PERFORMED  Erick Colace 01/08/2013 7:44 AM

## 2013-01-08 NOTE — Progress Notes (Signed)
Physical Therapy Session Note  Patient Details  Name: [name] MRN: [id number] Date of Birth: September 03, 1940  Today's Date: 01/08/2013 Time: 0907-1000 and 14:15-15:00 ( ) Time Calculation (min): 53 min  Short Term Goals: Week 1:  PT Short Term Goal 1 (Week 1): Pt will roll R/L with side rail and min A. PT Short Term Goal 2 (Week 1): Pt will transfer supine to edge of bed, edge of bed to supine with min A.  PT Short Term Goal 3 (Week 1): Pt will perform stand pivot transfers with min A.  PT Short Term Goal 4 (Week 1): Pt will ambulate about 150 feet with LRAD and min A.  PT Short Term Goal 5 (Week 1): Pt will ascend/descend 12-14 steps with 1 rail and min A.   Skilled Therapeutic Interventions/Progress Updates:  1:2 Tx focused on gait with/wihtout device, dynamic balance, and functional transfer training. Pt resting in bed upon arrival, feeling much better today with minimal hip pain. Supine>sit with S and logrolling, cues for technique. Nice performance.  Transfers with min A/S throughout tx with cues for efficiency and safety.  Gait in room with RW and min A. Toilet transfer [employer].  Gait in controlled setting x175' with x1 with RW and x1 with no device, both with min A for balance. Pt needing cues for focusing on task and paying attention to LE placement. Pt with narrow BOS and unsteadiness during turns.   Static and dynamic balance tasks including:  Serial sit<>stand without UE support, cone taps with/without UE support, lateral walking, marching in place.   2:2 Pt resting in bed, agreeable to PT. Tx focused on transfers, gait with RW, stairs, and therex for gen strengthening and activity tolerance.  Supine<>sit with S logrolling.  Transfers with min-guard A. Pt needing min A during in-room navigation to collect shoes, due to general unsteadiness and lack of foot clearance during steps.  Gait in controlled and carpeted setting 1x200' and 1x175' with RW and min-guard A. Pt  needing cues for increased step width, heel strike, and bil foot clearance. Pt able to adjust gait with cues nicely, but not maintain.  Steps with 1 rail x15 and min-guard with cues for sequence.  Nustep x74min with bil UE and LE. Level 4 with goal of 60spm, but pt only able to achieve ~40.  Pt reports some hip pain after this task, but was relieved following gait. Provided hot pack to reduce return of pain.  Pt wanting to return to bed end of tx. Discussed falls prevention, home set-up, and general progress towards goals during multiple seated rest breaks.       Therapy Documentation Precautions:  Precautions Precautions: Fall Precaution Comments: right hemiparesis Restrictions Weight Bearing Restrictions: No   Vital Signs: Therapy Vitals Pulse Rate: 94 BP: 116/73 mmHg Patient Position, if appropriate: Sitting Pain: Pain Assessment Pain Assessment: 0-10 Pain Score: 4  Pain Type: Acute pain Pain Location: Hip Pain Orientation: Right Pain Descriptors / Indicators: Aching Pain Intervention(s): Repositioned    Locomotion : Ambulation Ambulation/Gait Assistance: 4: Min assist   See FIM for current functional status  Therapy/Group: Individual Therapy Clydene Laming, PT, DPT  01/08/2013, 10:56 AM

## 2013-01-08 NOTE — Progress Notes (Signed)
Speech Language Pathology Daily Session Note  Patient Details  Name: [name] MRN: [id number] Date of Birth: 1940/10/26  Today's Date: 01/08/2013 Time: 0830-0900 Time Calculation (min): 30 min  Short Term Goals: Week 1: SLP Short Term Goal 1 (Week 1): Pt will demonstrate intellectual awareness of at least 1 cognitive and 1 physical impairment with MIn cues SLP Short Term Goal 2 (Week 1): Pt will demonstrate adequate medication management with use of external aid with Min cues SLP Short Term Goal 3 (Week 1): Pt will demonstrate basic problem solving during functional task with Min cues SLP Short Term Goal 4 (Week 1): Pt will utilize external memory aids to recall new and daily information with Min cues  Skilled Therapeutic Interventions: Treatment focused on cognitive goals. SLP facilitated session with Min cues for recall of events from previous day, including the types of therapy she is receiving. Pt demonstrated intellectual awareness of physical impairment with supervision level question cues. Pt participated in structured money management task with supervision level verbal cues. Continue plan of care.   FIM:  Comprehension Comprehension Mode: Auditory Comprehension: 5-Follows basic conversation/direction: With no assist Expression Expression Mode: Verbal Expression: 5-Expresses basic needs/ideas: With no assist Social Interaction Social Interaction: 5-Interacts appropriately 90% of the time - Needs monitoring or encouragement for participation or interaction. Problem Solving Problem Solving: 5-Solves basic 90% of the time/requires cueing < 10% of the time Memory Memory: 5-Recognizes or recalls 90% of the time/requires cueing < 10% of the time  Pain Pain Assessment Pain Assessment: 0-10 Pain Score: 5  Pain Type: Acute pain Pain Location: Rib cage Pain Orientation: Right Pain Descriptors / Indicators: Aching Pain Intervention(s): RN made  aware;Repositioned  Therapy/Group: Individual Therapy   Maxcine Ham, M.A. CCC-SLP [phone]   Maxcine Ham 01/08/2013, 12:27 PM

## 2013-01-08 NOTE — Progress Notes (Signed)
Occupational Therapy Session Note  Patient Details  Name: [name] MRN: [id number] Date of Birth: 05-20-1940  Today's Date: 01/08/2013 Time: 1005-1105 Time Calculation (min): 60 min  Short Term Goals: Week 1:  OT Short Term Goal 1 (Week 1): Pt. will be supervion with bathing OT Short Term Goal 2 (Week 1): Pt. will maintained staning balance during bathing sith superviswion OT Short Term Goal 3 (Week 1): Pt. will dress LB with minimal assist OT Short Term Goal 4 (Week 1): Pt. will transfer to toilet with supervision  Skilled Therapeutic Interventions/Progress Updates:      Pt seen for BADL retraining of toileting, bathing and dressing with a focus on dynamic balance, activity tolerance, and left side awareness. Pt resting in bed and needed encouragement to get OOB. She ambulated to toilet and toileted with steady assist and then exited bathroom to bathe at sink. Pt was able to stand at sink for over 20 minutes to bathe. She tends to be slow moving, methodical, and slight perseveration noted.  She sat in W/c to doff old pants and don new ones with min assist for L leg. She began to c/o pain in R lower ribs only when bending forward, and she became too fatigued to donn socks and shoes herself.  Pt also worked on a few standing balance exercises with reaching arms overhead and to the side.  Decreased proprioception of LUE. Pt requested to lie down. Pt settled in bed with bed alarm and call light/ phone in reach.  Therapy Documentation Precautions:  Precautions Precautions: Fall Precaution Comments: right hemiparesis Restrictions Weight Bearing Restrictions: No Vital Signs: Therapy Vitals Pulse Rate: 94 BP: 116/73 mmHg Patient Position, if appropriate: Sitting Pain: Pain Assessment Pain Assessment: 0-10 Pain Score: 5  Pain Type: Acute pain Pain Location: Rib cage Pain Orientation: Right Pain Descriptors / Indicators: Aching Pain Intervention(s): RN made  aware;Repositioned ADL: See FIM for current functional status  Therapy/Group: Individual Therapy  SAGUIER,JULIA 01/08/2013, 11:15 AM

## 2013-01-09 ENCOUNTER — Inpatient Hospital Stay (HOSPITAL_COMMUNITY): Payer: Medicare Other

## 2013-01-09 ENCOUNTER — Inpatient Hospital Stay (HOSPITAL_COMMUNITY): Payer: Medicare Other | Admitting: Occupational Therapy

## 2013-01-09 LAB — PHENYTOIN LEVEL, TOTAL: Phenytoin Lvl: <2.5 ug/mL — ABNORMAL LOW (ref 10.0–20.0)

## 2013-01-09 MED ORDER — PHENYTOIN 50 MG PO CHEW
100.0000 mg | CHEWABLE_TABLET | Freq: Two times a day (BID) | ORAL | Status: DC
  Administered 2013-01-09 – 2013-01-15 (×12): 100 mg via ORAL
  Filled 2013-01-09 (×14): qty 2

## 2013-01-09 MED ORDER — LEVOFLOXACIN 250 MG PO TABS
250.0000 mg | ORAL_TABLET | ORAL | Status: DC
  Filled 2013-01-09: qty 1

## 2013-01-09 MED ORDER — ACETAMINOPHEN 325 MG PO TABS
650.0000 mg | ORAL_TABLET | Freq: Two times a day (BID) | ORAL | Status: DC
  Administered 2013-01-09 – 2013-01-15 (×12): 650 mg via ORAL
  Filled 2013-01-09 (×12): qty 2

## 2013-01-09 MED ORDER — LEVOFLOXACIN 250 MG PO TABS
250.0000 mg | ORAL_TABLET | ORAL | Status: AC
  Administered 2013-01-09 – 2013-01-10 (×2): 250 mg via ORAL
  Filled 2013-01-09 (×2): qty 1

## 2013-01-09 NOTE — Progress Notes (Signed)
Occupational Therapy Session Note  Patient Details  Name: [name] MRN: [id number] Date of Birth: 03-20-1940  Today's Date: 01/09/2013 Time: 0905-1005 Time Calculation (min): 60 min  Short Term Goals: Week 1:  OT Short Term Goal 1 (Week 1): Pt. will be supervion with bathing OT Short Term Goal 2 (Week 1): Pt. will maintained staning balance during bathing sith superviswion OT Short Term Goal 3 (Week 1): Pt. will dress LB with minimal assist OT Short Term Goal 4 (Week 1): Pt. will transfer to toilet with supervision  Skilled Therapeutic Interventions/Progress Updates:      Pt seen for BADL retraining of toileting, bathing, and dressing with a focus on activity tolerance, balance, left side awareness.  Pt demonstrated a great deal of progress with endurance, focusing on tasks, speed of movement, and balance.  She was able to complete all of her self care and transfers with close supervision. She did not require cues for left side awareness today.  Pt stated she is anxious to leave and will have supervision at her daughters house. Pt does have a LTG of toileting with mod I so she will continue to work towards that. Pt resting in w/c at end of session with call light in reach.  Therapy Documentation Precautions:  Precautions Precautions: Fall Precaution Comments: right hemiparesis Restrictions Weight Bearing Restrictions: No    Vital Signs: Therapy Vitals Pulse Rate: 88 BP: 150/79 mmHg Pain: Pain Assessment Pain Assessment: No/denies pain ADL: See FIM for current functional status  Therapy/Group: Individual Therapy  Sylva Overley 01/09/2013, 10:05 AM

## 2013-01-09 NOTE — Patient Care Conference (Signed)
Inpatient RehabilitationTeam Conference and Plan of Care Update Date: 01/09/2013   Time: 11:15 Am    Patient Name: [name]      Medical Record Number: [id number]  Date of Birth: 1940-12-28 Sex: Female         Room/Bed: 4W06C/4W06C-01 Payor Info: Payor: Advertising copywriter MEDICARE / Plan: [employer] MEDICARE COMPLETE / Product Type: *No Product type* /    Admitting Diagnosis: CVA  Admit Date/Time:  01/04/2013  4:21 PM Admission Comments: No comment available   Primary Diagnosis:  CVA (cerebral infarction) Principal Problem: CVA (cerebral infarction)  Patient Active Problem List   Diagnosis Date Noted  . Basal ganglia infarction 01/08/2013  . CVA (cerebral infarction) 01/04/2013  . Generalized seizure 12/28/2012  . Hypertension 12/28/2012  . Seizure 12/28/2012  . Metabolic acidosis 12/28/2012  . Encephalopathy 12/28/2012  . Acute renal failure 12/28/2012    Expected Discharge Date: Expected Discharge Date: 01/15/13  Team Members Present: Physician leading conference: Dr. Claudette Laws Social Worker Present: Dossie Der, LCSW Nurse Present: Other (comment) Earnestine Mealing Avrgno-RN) PT Present: Wanda Plump, PT OT Present: Bretta Bang, Carollee Sires, OT SLP Present: Maxcine Ham, SLP PPS Coordinator present : Tora Duck, RN, CRRN     Current Status/Progress Goal Weekly Team Focus  Medical   poor awareness of deficit, no safety issues  maintain safety  encourage activity   Bowel/Bladder   continent of bowel and bladder modified indepenent with toileting  continent of bowel and bladder  up to bathroom/BSC with all toileting needs   Swallow/Nutrition/ Hydration     [facility]        ADL's   as of 01/09/13, supervision with all self care of shower, dress, toilet, groom, and ADL transfers  supervision overall except for mod I with toileting and grooming  ADL retraining, dynamic balance, left side awareness, pt education   Mobility   S bed mobility, Min A transfers and gait  with RW, min A stairs with 1 rail  S transfers, gait, and Min A stairs   Static and dynamic balance, gait training, gen strengthening, stairs - "hip" pain improved Tuesday   Communication     [facility]        Safety/Cognition/ Behavioral Observations  Min-Mod cues for recall and mildly complex problem solving  Min  complex problem solving, utilization of external aids   Pain   scheduled tylenol 650 mg po BID  3 or less on scale of 1-10      Skin   intact  no new breakdown while on rehab         *See Care Plan and progress notes for long and short-term goals.  Barriers to Discharge: poor motivation    Possible Resolutions to Barriers:  cont rehab, educate caregivers    Discharge Planning/Teaching Needs:  Home with daughter who is taking a FMLA to provide the supervision she needs.      Team Discussion:  Goals supervision level and will have daughter with her-FMLA Bump dilantin level up.  Still tends to lose her balance and has safety issues.    Revisions to Treatment Plan:  NO issues   Continued Need for Acute Rehabilitation Level of Care: The patient requires daily medical management by a physician with specialized training in physical medicine and rehabilitation for the following conditions: Daily direction of a multidisciplinary physical rehabilitation program to ensure safe treatment while eliciting the highest outcome that is of practical value to the patient.: Yes Daily analysis of laboratory values and/or radiology reports  with any subsequent need for medication adjustment of medical intervention for : Neurological problems  [name], Lemar Livings 01/10/2013, 1:17 PM

## 2013-01-09 NOTE — Progress Notes (Signed)
Speech Language Pathology Daily Session Note  Patient Details  Name: [name] MRN: [id number] Date of Birth: 12/24/1940  Today's Date: 01/09/2013 Time: 1000-1030 Time Calculation (min): 30 min  Short Term Goals: Week 1: SLP Short Term Goal 1 (Week 1): Pt will demonstrate intellectual awareness of at least 1 cognitive and 1 physical impairment with MIn cues SLP Short Term Goal 2 (Week 1): Pt will demonstrate adequate medication management with use of external aid with Min cues SLP Short Term Goal 3 (Week 1): Pt will demonstrate basic problem solving during functional task with Min cues SLP Short Term Goal 4 (Week 1): Pt will utilize external memory aids to recall new and daily information with Min cues  Skilled Therapeutic Interventions: Treatment focused on cognitive goals. SLP facilitated session with checkbook balancing task. Pt completed ~50% of task with Supervision level verbal cues for accuracy. Pt was unable to recall what therapy appointments she had for the remainder of the day, however utilized her schedule with Supervision question cues. Continue plan of care.   FIM:  Comprehension Comprehension Mode: Auditory Comprehension: 5-Follows basic conversation/direction: With no assist Expression Expression Mode: Verbal Expression: 5-Expresses basic needs/ideas: With no assist Social Interaction Social Interaction: 5-Interacts appropriately 90% of the time - Needs monitoring or encouragement for participation or interaction. Problem Solving Problem Solving: 5-Solves basic 90% of the time/requires cueing < 10% of the time Memory Memory: 5-Recognizes or recalls 90% of the time/requires cueing < 10% of the time  Pain  No/denies pain  Therapy/Group: Individual Therapy   Maxcine Ham, M.A. CCC-SLP [phone]   Maxcine Ham 01/09/2013, 4:20 PM

## 2013-01-09 NOTE — Progress Notes (Signed)
Subjective/Complaints: Son from [place] coming in today A 12 point review of systems has been performed and if not noted above is otherwise negative.    Objective: Vital Signs: Blood pressure 150/79, pulse 88, temperature 98 F (36.7 C), temperature source Oral, resp. rate 17, height 5\' 2"  (1.575 m), weight 41.1 kg (90 lb 9.7 oz), SpO2 97.00%. No results found.  Recent Labs  01/07/13 0540  WBC 6.7  HGB 12.4  HCT 36.1  PLT 457*    Recent Labs  01/07/13 0540  NA 135  K 4.4  CL 101  GLUCOSE 101*  BUN 19  CREATININE 1.36*  CALCIUM 9.0   CBG (last 3)  No results found for this basename: GLUCAP,  in the last 72 hours  Wt Readings from Last 3 Encounters:  01/09/13 41.1 kg (90 lb 9.7 oz)  01/03/13 43.046 kg (94 lb 14.4 oz)    Physical Exam:  Nursing note and vitals reviewed.  Constitutional: She appears listless. She appears cachectic. She is cooperative. No distress.  Frail appearing HENT:  Head: Normocephalic and atraumatic.  Eyes: Conjunctivae are normal. Pupils are equal, round, and reactive to light.  Neck: Normal range of motion. No JVD present. No tracheal deviation present. No thyromegaly present.  Cardiovascular: Normal rate, regular rhythm, S1 normal and S2 normal. Exam reveals gallop.  Respiratory: No respiratory distress. Lungs clear She has no wheezes. She has no rales.  GI: Soft. Bowel sounds are normal. She exhibits no distension. There is no rebound. NT Musculoskeletal: Normal range of motion. She exhibits no edema. Mild tenderness in LUQ, lower rib cage Lymphadenopathy:  She has no cervical adenopathy.  Neurological: Mild left central 7,  Left delt, bic, tricep 4+, wrist/hand 4. Decreased FMC and mild ataxia of LUE. LLE 4+HF, 4+KE, ankle 4-. RUE 4 to 4+, RLE 4+ prox and 4+ distally. No gross sensory findings. Has reasonable insight and awareness.  Skin: Skin is warm and dry. She is not diaphoretic.  Psychiatric: She has a normal mood and affect.  Her behavior is normal. Judgment and thought content normal.  Tenderness left side low back  Assessment/Plan: 1. Functional deficits secondary to thrombotic right basal ganglia infarct which require 3+ hours per day of interdisciplinary therapy in a comprehensive inpatient rehab setting. Physiatrist is providing close team supervision and 24 hour management of active medical problems listed below. Physiatrist and rehab team continue to assess barriers to discharge/monitor patient progress toward functional and medical goals. Team conference today please see physician documentation under team conference tab, met with team face-to-face to discuss problems,progress, and goals. Formulized individual treatment plan based on medical history, underlying problem and comorbidities. FIM: FIM - Bathing Bathing Steps Patient Completed: Chest;Right Arm;Left Arm;Abdomen;Front perineal area;Buttocks;Right upper leg;Left upper leg;Right lower leg (including foot);Left lower leg (including foot) Bathing: 4: Steadying assist  FIM - Upper Body Dressing/Undressing Upper body dressing/undressing steps patient completed: Thread/unthread right sleeve of pullover shirt/dresss;Put head through opening of pull over shirt/dress;Pull shirt over trunk;Thread/unthread left sleeve of pullover shirt/dress Upper body dressing/undressing: 5: Supervision: Safety issues/verbal cues FIM - Lower Body Dressing/Undressing Lower body dressing/undressing steps patient completed: Thread/unthread right underwear leg;Thread/unthread left underwear leg;Pull underwear up/down;Thread/unthread right pants leg;Pull pants up/down Lower body dressing/undressing: 3: Mod-Patient completed 50-74% of tasks  FIM - Toileting Toileting steps completed by patient: Adjust clothing prior to toileting;Performs perineal hygiene;Adjust clothing after toileting Toileting Assistive Devices: Grab bar or rail for support Toileting: 4: Steadying assist  FIM -  Diplomatic Services operational officer  Devices: Grab bars;Walker Toilet Transfers: 4-To toilet/BSC: Min A (steadying Pt. > 75%);4-From toilet/BSC: Min A (steadying Pt. > 75%)  FIM - Bed/Chair Transfer Bed/Chair Transfer Assistive Devices: Arm rests;Walker Bed/Chair Transfer: 5: Supine > Sit: Supervision (verbal cues/safety issues);5: Bed > Chair or W/C: Supervision (verbal cues/safety issues);4: Chair or W/C > Bed: Min A (steadying Pt. > 75%)  FIM - Locomotion: Wheelchair Distance: 75 Locomotion: Wheelchair: 0: Activity did not occur FIM - Locomotion: Ambulation Locomotion: Ambulation Assistive Devices: Designer, industrial/product Ambulation/Gait Assistance: 4: Min guard Locomotion: Ambulation: 4: Travels 150 ft or more with minimal assistance (Pt.>75%)  Comprehension Comprehension Mode: Auditory Comprehension: 5-Follows basic conversation/direction: With no assist  Expression Expression Mode: Verbal Expression: 5-Expresses basic needs/ideas: With no assist  Social Interaction Social Interaction: 5-Interacts appropriately 90% of the time - Needs monitoring or encouragement for participation or interaction.  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 DVT Prophylaxis/Anticoagulation: Pharmaceutical: Lovenox  2. Pain Management: complains of left UQ pain, rib pain. ?recent fall  -heating pad  -prn tylenol  3. Mood: pt flat, but this largely due to fatigue, meds, medical issues, etc. Team to provide ego support as possible to pt. Seems pretty motivated, and is feeling "more like herself" today.  4. Neuropsych: This patient is capable of making decisions on her own behalf.  5. New onset seizure: Continue Keppra, dilantin, and vimpat. Recheck dilantin  level  6. HTN: Will monitor with bid checks.  7. Constipation: Will set bowel program.had BM yest pm                       8. LBP Kpad and sports cream mobilize, no c/o today  LOS (Days) 5 A FACE TO FACE EVALUATION WAS PERFORMED  Mariane Burpee E 01/09/2013 8:04 AM

## 2013-01-09 NOTE — Progress Notes (Signed)
Social Work Patient ID: [name], female   DOB: 07/24/1940, 72 y.o.   MRN: [id number] Met with pt and son to inform of team conference goals-supervision level and discharge date 12/16.  She is pleased with her progress and wants to go home Quickly.  Daughter to be back on Friday and is taking FMLA to be there with pt.  Will discuss with both discharge needs and follow up.

## 2013-01-09 NOTE — Progress Notes (Signed)
Physical Therapy Note  Patient Details  Name: [name] MRN: [id number] Date of Birth: 30-Apr-1940 Today's Date: 01/09/2013 1120-1205, 45 min individual therapy  Nephew observed. He stated pt will d/c to her son's house, which is a single level house, with 3-4 STE; family plans to install rails.  Pain R hip/pelvis area 5/10; RN dispensed pain meds during therapy.  Therapist discussed pain with Pam, PA.  Pain seems to be muscular; pt is tender over ASIS and iliac crest and has pain with movement.  Tx focused on neuromuscular re-education :  Rolling L and sitting up, min assist.  Extra time due to pain.    trunk shortening/lengthening/rotatiing in sitting when reaching with either hand within BOS.  Reaching limited by pain.  Gait without AD x 200' including turns with min guard assist.  Distance limited by fatigue and R side pain.  Pt somewhat unsteady throughout walk, tactile cues for wt shift.  For activity tolerance:  W/c propulsion using bil LEs x 75' with supervision.  Pt returned to room, all needs placed within reach.  Family present.  [name] 01/09/2013, 11:48 AM

## 2013-01-10 ENCOUNTER — Inpatient Hospital Stay (HOSPITAL_COMMUNITY): Payer: Medicare Other | Admitting: Occupational Therapy

## 2013-01-10 ENCOUNTER — Inpatient Hospital Stay (HOSPITAL_COMMUNITY): Payer: Medicare Other

## 2013-01-10 DIAGNOSIS — I10 Essential (primary) hypertension: Secondary | ICD-10-CM

## 2013-01-10 DIAGNOSIS — I6350 Cerebral infarction due to unspecified occlusion or stenosis of unspecified cerebral artery: Secondary | ICD-10-CM

## 2013-01-10 NOTE — Progress Notes (Signed)
Subjective/Complaints: Discussed D/C date 12/16, pt is pleased R Hip nl A 12 point review of systems has been performed and if not noted above is otherwise negative.    Objective: Vital Signs: Blood pressure 128/91, pulse 92, temperature 98.2 F (36.8 C), temperature source Oral, resp. rate 16, height 5\' 2"  (1.575 m), weight 41.2 kg (90 lb 13.3 oz), SpO2 98.00%. Dg Hip Complete Right  01/09/2013   CLINICAL DATA:  Pain and difficulty weight-bearing  EXAM: RIGHT HIP - COMPLETE 2+ VIEW  COMPARISON:  None  FINDINGS: There is no evidence of hip fracture or dislocation. There is no evidence of arthropathy or other focal bone abnormality.  IMPRESSION: Negative.   Electronically Signed   By: Signa Kell M.D.   On: 01/09/2013 15:26   No results found for this basename: WBC, HGB, HCT, PLT,  in the last 72 hours No results found for this basename: NA, K, CL, CO, GLUCOSE, BUN, CREATININE, CALCIUM,  in the last 72 hours CBG (last 3)  No results found for this basename: GLUCAP,  in the last 72 hours  Wt Readings from Last 3 Encounters:  01/10/13 41.2 kg (90 lb 13.3 oz)  01/03/13 43.046 kg (94 lb 14.4 oz)    Physical Exam:  Nursing note and vitals reviewed.  Constitutional: She appears listless. She appears cachectic. She is cooperative. No distress.  Frail appearing HENT:  Head: Normocephalic and atraumatic.  Eyes: Conjunctivae are normal. Pupils are equal, round, and reactive to light.  Neck: Normal range of motion. No JVD present. No tracheal deviation present. No thyromegaly present.  Cardiovascular: Normal rate, regular rhythm, S1 normal and S2 normal. Exam reveals gallop.  Respiratory: No respiratory distress. Lungs clear She has no wheezes. She has no rales.  GI: Soft. Bowel sounds are normal. She exhibits no distension. There is no rebound. NT Musculoskeletal: Normal range of motion. She exhibits no edema. Mild tenderness in LUQ, lower rib cage Lymphadenopathy:  She has no  cervical adenopathy.  Neurological: resolved left central 7,  Left delt, bic, tricep 4+, wrist/hand 4. Decreased FMC and mild ataxia of LUE. LLE 4+HF, 4+KE, ankle 4-. RUE 4 to 4+, RLE 4+ prox and 4+ distally. No gross sensory findings. Has reasonable insight and awareness.  Skin: Skin is warm and dry. She is not diaphoretic.  Psychiatric: She has a normal mood and affect. Her behavior is normal. Judgment and thought content normal.  Tenderness left side low back  Assessment/Plan: 1. Functional deficits secondary to thrombotic right basal ganglia infarct which require 3+ hours per day of interdisciplinary therapy in a comprehensive inpatient rehab setting. Physiatrist is providing close team supervision and 24 hour management of active medical problems listed below. Physiatrist and rehab team continue to assess barriers to discharge/monitor patient progress toward functional and medical goals.  FIM: FIM - Bathing Bathing Steps Patient Completed: Chest;Right Arm;Left Arm;Abdomen;Front perineal area;Buttocks;Right upper leg;Left upper leg;Right lower leg (including foot);Left lower leg (including foot) Bathing: 5: Supervision: Safety issues/verbal cues  FIM - Upper Body Dressing/Undressing Upper body dressing/undressing steps patient completed: Thread/unthread right sleeve of pullover shirt/dresss;Put head through opening of pull over shirt/dress;Pull shirt over trunk;Thread/unthread left sleeve of pullover shirt/dress Upper body dressing/undressing: 5: Supervision: Safety issues/verbal cues FIM - Lower Body Dressing/Undressing Lower body dressing/undressing steps patient completed: Thread/unthread right underwear leg;Thread/unthread left underwear leg;Pull underwear up/down;Thread/unthread right pants leg;Pull pants up/down;Thread/unthread left pants leg;Don/Doff right sock;Don/Doff left sock;Don/Doff right shoe;Don/Doff left shoe;Fasten/unfasten right shoe;Fasten/unfasten left shoe Lower body  dressing/undressing: 5: Supervision:  Safety issues/verbal cues  FIM - Toileting Toileting steps completed by patient: Adjust clothing prior to toileting;Performs perineal hygiene;Adjust clothing after toileting Toileting Assistive Devices: Grab bar or rail for support Toileting: 5: Supervision: Safety issues/verbal cues  FIM - Diplomatic Services operational officer Devices: Grab bars;Walker Toilet Transfers: 5-From toilet/BSC: Supervision (verbal cues/safety issues)  FIM - Banker Devices: Arm rests;Walker Bed/Chair Transfer: 5: Supine > Sit: Supervision (verbal cues/safety issues);5: Bed > Chair or W/C: Supervision (verbal cues/safety issues)  FIM - Locomotion: Wheelchair Distance: 75 Locomotion: Wheelchair: 0: Activity did not occur FIM - Locomotion: Ambulation Locomotion: Ambulation Assistive Devices: Designer, industrial/product Ambulation/Gait Assistance: 4: Min guard Locomotion: Ambulation: 4: Travels 150 ft or more with minimal assistance (Pt.>75%)  Comprehension Comprehension Mode: Auditory Comprehension: 5-Follows basic conversation/direction: With no assist  Expression Expression Mode: Verbal Expression: 5-Expresses basic needs/ideas: With no assist  Social Interaction Social Interaction: 5-Interacts appropriately 90% of the time - Needs monitoring or encouragement for participation or interaction.  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 DVT Prophylaxis/Anticoagulation: Pharmaceutical: Lovenox  2. Pain Management: complains of left UQ pain, rib pain. ?recent fall  -heating pad  -prn tylenol  3. Mood: pt flat, but this largely due to fatigue, meds, medical issues, etc. Team to provide ego  support as possible to pt. Seems pretty motivated, and is feeling "more like herself" today.  4. Neuropsych: This patient is capable of making decisions on her own behalf.  5. New onset seizure: Continue Keppra, dilantin, and vimpat. Recheck dilantin level  6. HTN: Will monitor with bid checks.  7. Constipation: Will set bowel program.had BM yest pm                       8. LBP Kpad and sports cream mobilize, no c/o today  LOS (Days) 6 A FACE TO FACE EVALUATION WAS PERFORMED  Erick Colace 01/10/2013 8:43 AM

## 2013-01-10 NOTE — Progress Notes (Signed)
Speech Language Pathology Daily Session Note  Patient Details  Name: [name] MRN: [id number] Date of Birth: [birth year]  Today's Date: 01/10/2013 Time: 1045-1130 Time Calculation (min): 45 min  Short Term Goals: Week 1: SLP Short Term Goal 1 (Week 1): Pt will demonstrate intellectual awareness of at least 1 cognitive and 1 physical impairment with MIn cues SLP Short Term Goal 2 (Week 1): Pt will demonstrate adequate medication management with use of external aid with Min cues SLP Short Term Goal 3 (Week 1): Pt will demonstrate basic problem solving during functional task with Min cues SLP Short Term Goal 4 (Week 1): Pt will utilize external memory aids to recall new and daily information with Min cues  Skilled Therapeutic Interventions: Treatment focused on cognitive goals. SLP facilitated session with Supervision level cues for use of schedule to recall daily events. Pt completed remainder of checkbook balancing task with supervision level verbal cues for accuracy. SLP assisted with generation of medication list, as pt was unable to recall any of her current medications. Continue plan of care.   FIM:  Comprehension Comprehension Mode: Auditory Comprehension: 5-Follows basic conversation/direction: With no assist Expression Expression Mode: Verbal Expression: 5-Expresses basic needs/ideas: With no assist Social Interaction Social Interaction: 5-Interacts appropriately 90% of the time - Needs monitoring or encouragement for participation or interaction. Problem Solving Problem Solving: 5-Solves basic 90% of the time/requires cueing < 10% of the time Memory Memory: 5-Recognizes or recalls 90% of the time/requires cueing < 10% of the time  Pain Pain Assessment Pain Assessment: 0-10 Pain Score: 0-No pain  Therapy/Group: Individual Therapy   Maxcine Ham, M.A. CCC-SLP [phone]   Maxcine Ham 01/10/2013, 4:15 PM

## 2013-01-10 NOTE — Progress Notes (Signed)
Physical Therapy Note  Patient Details  Name: [name] MRN: [id number] Date of Birth: 11/25/1940 Today's Date: 01/10/2013  No pain reported today.  0805-0900, 60 min 1340-1405, 25 min  individual therapy  tx 1:  focused on forced use during dynamic sitting and standing balance, w/c propulsion  for neuromuscular re-education:  Rolling L and sitting up with supervision, no cues to remember technique.  Pt sat bedside and donned socks and shoes slowly with supervision. Basic transfer with supervision.  W/c propulsion using bil UE s or bil LEs x 100' with supervision, limited by activity tolerance.  Dynamic standing balance while folding laundry x 3.5 minutes without LOB or fatigue.  Gait without AD x 150' on level tile with min guard asssit during turns, S on straight path.  Velocity on 30 m timed test = 14 seconds,    Gait up/down 4 steps with 1 rail, supervision, leading with R foot up and down, step to method.  No knee buckling. Up curb and up /down ramp with min guard assist; down curb with mod assist for LOB backwards with delayed and inadequate balance reactions (shoulder elevation only noted).  Simulated car transfer with supervision.  Balance retraining standing on wedge and during external perturbations.  Pt demonstrated no ankle or hip strategy initially with posterior external perturbations; after standing on wedge x 1 minutes, these were improved.  Returned to room; pt left in w/c with all needs within reach.  tx 2:  neuromuscular re-education as above for :  gait without AD on level tile and carpet x 150' Dynamic standing activity of pulling out armchair, scooting up t/from table.  Pt initially demonstrated unsafe technique for this, but improved after 1 demo, VCs.  Fall Prevention I exs without weights, using hallway railing for support.  Pt exhausted; requested return to bed, but agreed to sit up for dinner. Bed mobility with supervision, no cues  All needs left  within reach and bed alarm set.  Friend visiting.   Dilcia Rybarczyk 01/10/2013, 7:56 AM

## 2013-01-10 NOTE — Progress Notes (Signed)
Occupational Therapy Session Note  Patient Details  Name: [name] MRN: [id number] Date of Birth: [birth year]  Today's Date: 01/10/2013 Time: 0900-1005 Time Calculation (min): 65 min  Short Term Goals: No short term goals set  Skilled Therapeutic Interventions/Progress Updates:    Pt seen for BADL retraining of toileting and dressing with a focus on safe mobility, dynamic balance and activity tolerance.  Pt declined a shower today as she showered yesterday. Pt was able to ambulate around room with walker with supervision and without walker with steady assist.  Pt was able to complete all self care with supervision, except for assist with socks as her right abdominal area was hurting when she attempted to bend over.  She needed safety cues to stand closer to towel bar as she was removing the washcloths from the bar as pt was leaning forward over hips too far.  Pt demonstrated a great deal of progress with activity tolerance and balance, until the last 10 minutes of the session when her hip began to hurt. A friend of hers arrived and expressed concern about her pain and changes in her personality of talking less, appearing distressed.  Informed PA of abdominal/ hip pain. Pt resting in w/c with call light at end of session.  Therapy Documentation Precautions:  Precautions Precautions: Fall Precaution Comments: right hemiparesis Restrictions Weight Bearing Restrictions: No   Pain: Pain Assessment Pain Assessment: 0-10 Pain Score: 8  Pain Type: Acute pain Pain Location: Abdomen Pain Orientation: Right Pain Descriptors / Indicators: Aching Pain Onset: Sudden Pain Intervention(s): RN made aware ADL: See FIM for current functional status  Therapy/Group: Individual Therapy  [name] 01/10/2013, 12:04 PM

## 2013-01-11 ENCOUNTER — Inpatient Hospital Stay (HOSPITAL_COMMUNITY): Payer: Medicare Other | Admitting: Occupational Therapy

## 2013-01-11 ENCOUNTER — Inpatient Hospital Stay (HOSPITAL_COMMUNITY): Payer: Medicare Other | Admitting: Physical Therapy

## 2013-01-11 ENCOUNTER — Inpatient Hospital Stay (HOSPITAL_COMMUNITY): Payer: Medicare Other | Admitting: Speech Pathology

## 2013-01-11 ENCOUNTER — Inpatient Hospital Stay (HOSPITAL_COMMUNITY): Payer: Medicare Other | Admitting: *Deleted

## 2013-01-11 DIAGNOSIS — G8110 Spastic hemiplegia affecting unspecified side: Secondary | ICD-10-CM

## 2013-01-11 LAB — CREATININE, SERUM
Creatinine, Ser: 1.34 mg/dL — ABNORMAL HIGH (ref 0.50–1.10)
GFR calc Af Amer: 45 mL/min — ABNORMAL LOW (ref >90)
GFR calc non Af Amer: 39 mL/min — ABNORMAL LOW (ref >90)

## 2013-01-11 NOTE — Progress Notes (Signed)
Physical Therapy Note  Patient Details  Name: [name] MRN: [id number] Date of Birth: May 14, 1940 Today's Date: 01/11/2013  Time: 1400-1425 25 minutes  1:1 No c/o pain.  Pt performed gait training in controlled and home environment with and without SPC.  Pt requires min A with and without cane due to LE weakness, "staggering" gait.  Pt able to gait multiple attempts up to 150'.  Obstacle negotiation and training in home environment with min A, good safety awareness, decreased awareness of LOB.  Pt requires min A for LOB with distractions and divided attention tasks.  Floor transfer/fall recovery with supervision. Pt educated on safety with falls, pt expresses understanding.   Kindle Strohmeier 01/11/2013, 2:26 PM

## 2013-01-11 NOTE — Progress Notes (Signed)
Speech Language Pathology Daily Session Note  Patient Details  Name: [name] MRN: [id number] Date of Birth: 1940-04-05  Today's Date: 01/11/2013 Time: 1130-1200 Time Calculation (min): 30 min  Short Term Goals: Week 1: SLP Short Term Goal 1 (Week 1): Pt will demonstrate intellectual awareness of at least 1 cognitive and 1 physical impairment with MIn cues SLP Short Term Goal 2 (Week 1): Pt will demonstrate adequate medication management with use of external aid with Min cues SLP Short Term Goal 3 (Week 1): Pt will demonstrate basic problem solving during functional task with Min cues SLP Short Term Goal 4 (Week 1): Pt will utilize external memory aids to recall new and daily information with Min cues  Skilled Therapeutic Interventions: Skilled treatment session focused on addressing cognitive goals. SLP facilitated session with Supervision level cues for use of schedule to recall daily events and Mod faded to Min cues to use external aid to recall current medications.  Pt required Mod assist to demonstrate mental flexibility with old versus new medications and to verbally problem solve frequency of medications according to written aid. Continue plan of care.   FIM:  Comprehension Comprehension Mode: Auditory Comprehension: 5-Follows basic conversation/direction: With no assist Expression Expression Mode: Verbal Expression: 5-Expresses basic needs/ideas: With no assist Social Interaction Social Interaction: 5-Interacts appropriately 90% of the time - Needs monitoring or encouragement for participation or interaction. Problem Solving Problem Solving: 4-Solves basic 75 - 89% of the time/requires cueing 10 - 24% of the time Memory Memory: 4-Recognizes or recalls 75 - 89% of the time/requires cueing 10 - 24% of the time  Pain Pain Assessment Pain Assessment: No/denies pain Pain Score: 0-No pain  Therapy/Group: Individual Therapy  Charlane Ferretti.,  CCC-SLP [phone]  Ahrianna Siglin 01/11/2013, 12:25 PM

## 2013-01-11 NOTE — Progress Notes (Signed)
Physical Therapy Weekly Progress Note  Patient Details  Name: [name] MRN: [id number] Date of Birth: September 26, 1940  Today's Date: 01/11/2013 Time: 10:22- 11:20 ( ) and 15:00-15:30 ( )  Patient has met 5 of 5 short term goals.  Pt has made excellent progress with R-sided strength, balance, and activity tolerance and demonstrates generally good safety awareness. Berg Balance score improved from 26/56 to 47/56, still indicating moderate risk of falls, but great improvement. Pending increased training with [facility] and family ed, pt will be abel to leave early next week.   Patient continues to demonstrate the following deficits: Decreased R-sided sustained muscle activation, timing and coordination, and therefore will continue to benefit from skilled PT intervention to enhance overall performance with activity tolerance, balance and coordination.  Patient progressing toward long term goals..  Continue plan of care.  PT Short Term Goals Week 1:  PT Short Term Goal 1 (Week 1): Pt will roll R/L with side rail and min A. PT Short Term Goal 1 - Progress (Week 1): Met PT Short Term Goal 2 (Week 1): Pt will transfer supine to edge of bed, edge of bed to supine with min A.  PT Short Term Goal 2 - Progress (Week 1): Met PT Short Term Goal 3 (Week 1): Pt will perform stand pivot transfers with min A.  PT Short Term Goal 3 - Progress (Week 1): Met PT Short Term Goal 4 (Week 1): Pt will ambulate about 150 feet with LRAD and min A.  PT Short Term Goal 4 - Progress (Week 1): Met PT Short Term Goal 5 (Week 1): Pt will ascend/descend 12-14 steps with 1 rail and min A.  PT Short Term Goal 5 - Progress (Week 1): Met Week 2: STG=LTG   Skilled Therapeutic Interventions/Progress Updates:  1:2 Pt up in recliner, very fatigued from not sleeping well. Tx focused on gait with no device and SPC, dynamic balance, and flight of stairs.  Gait in busy environment 1x175' with no device and S/Min A due to LOB with  very narrow BOS. PT able to increase stance width, but drifts back to narrow steps. Gait with SPC following instruction for gait pattern 2x100, 2x150' with min-guard>>close S and indep with recovering from minor LOB. Pt with improved BOS with cane and cues, most comfortable in R hand. Repeated Berg assessment and educated pt on results, including functional implications.  Two flight of stairs (20 total) with 1 rail and SPC with min-guard A with cues for sequence.  Car transfer with S and demonstration on technique.  Pt left in recliner with all needs in reach.   2:2 Tx focused on community mobility with SPC and balance training.   Pt participated in community gait and mobility 1x > 800' with SPC and close S over varying and uneven surfaces. Pt able to navigate busy settings and tight spaces with close S and cues for generally being careful of foot placement. Pt had 1 LOB but was able to recover independently. Discussed typical community access and trouble shooting possible difficulties.   Performed weighted ball toss to rebounder on firm and on foam pad surfaces to increase ankle strategies and recover more quickly from perterbations. Pt performed x20 each condition with min-guard A and was able to retrieve ball from floor with close S.   Pt left in recliner with all needs in reach.         Therapy Documentation Precautions:  Precautions Precautions: Fall Precaution Comments: right hemiparesis Restrictions Weight Bearing Restrictions: No General:  Vital Signs: Therapy Vitals Pulse Rate: 88 BP: 128/80 mmHg Pain: Pain Assessment Pain Assessment: No/denies pain Pain Score: 0-No pain     Balance: Standardized Balance Assessment Standardized Balance Assessment: Berg Balance Test Berg Balance Test Sit to Stand: Able to stand  independently using hands Standing Unsupported: Able to stand safely 2 minutes Sitting with Back Unsupported but Feet Supported on Floor or Stool: Able to  sit safely and securely 2 minutes Stand to Sit: Sits safely with minimal use of hands Transfers: Able to transfer safely, minor use of hands Standing Unsupported with Eyes Closed: Able to stand 10 seconds with supervision Standing Ubsupported with Feet Together: Able to place feet together independently and stand for 1 minute with supervision From Standing, Reach Forward with Outstretched Arm: Can reach confidently >25 cm (10") From Standing Position, Pick up Object from Floor: Able to pick up shoe, needs supervision From Standing Position, Turn to Look Behind Over each Shoulder: Looks behind from both sides and weight shifts well Turn 360 Degrees: Able to turn 360 degrees safely in 4 seconds or less Standing Unsupported, Alternately Place Feet on Step/Stool: Able to stand independently and complete 8 steps >20 seconds Standing Unsupported, One Foot in Front: Able to plae foot ahead of the other independently and hold 30 seconds Standing on One Leg: Tries to lift leg/unable to hold 3 seconds but remains standing independently Total Score: 47  See FIM for current functional status  Therapy/Group: Individual Luan Moore, PT, DPT   01/11/2013, 4:11 PM

## 2013-01-11 NOTE — Progress Notes (Signed)
Subjective/Complaints: Slept on and off after 1 am, this is her usual pattern at home, cont of bladder, calls appropriately for assistance A 12 point review of systems has been performed and if not noted above is otherwise negative.    Objective: Vital Signs: Blood pressure 133/89, pulse 91, temperature 98.2 F (36.8 C), temperature source Oral, resp. rate 17, height 5\' 2"  (1.575 m), weight 44 kg (97 lb), SpO2 99.00%. Dg Hip Complete Right  01/09/2013   CLINICAL DATA:  Pain and difficulty weight-bearing  EXAM: RIGHT HIP - COMPLETE 2+ VIEW  COMPARISON:  None  FINDINGS: There is no evidence of hip fracture or dislocation. There is no evidence of arthropathy or other focal bone abnormality.  IMPRESSION: Negative.   Electronically Signed   By: Signa Kell M.D.   On: 01/09/2013 15:26   No results found for this basename: WBC, HGB, HCT, PLT,  in the last 72 hours No results found for this basename: NA, K, CL, CO, GLUCOSE, BUN, CREATININE, CALCIUM,  in the last 72 hours CBG (last 3)  No results found for this basename: GLUCAP,  in the last 72 hours  Wt Readings from Last 3 Encounters:  01/11/13 44 kg (97 lb)  01/03/13 43.046 kg (94 lb 14.4 oz)    Physical Exam:  Nursing note and vitals reviewed.  Constitutional: She appears listless. She appears cachectic. She is cooperative. No distress.  Frail appearing HENT:  Head: Normocephalic and atraumatic.  Eyes: Conjunctivae are normal. Pupils are equal, round, and reactive to light.  Neck: Normal range of motion. No JVD present. No tracheal deviation present. No thyromegaly present.  Cardiovascular: Normal rate, regular rhythm, S1 normal and S2 normal. Exam reveals gallop.  Respiratory: No respiratory distress. Lungs clear She has no wheezes. She has no rales.  GI: Soft. Bowel sounds are normal. She exhibits no distension. There is no rebound. NT Musculoskeletal: Normal range of motion. She exhibits no edema. Mild tenderness in LUQ, lower  rib cage Lymphadenopathy:  She has no cervical adenopathy.  Neurological: resolved left central 7,  Left delt, bic, tricep 4+, wrist/hand 4. Decreased FMC and mild ataxia of LUE. LLE 4+HF, 4+KE, ankle 4-. RUE 4 to 4+, RLE 4+ prox and 4+ distally. No gross sensory findings. Has reasonable insight and awareness.  Skin: Skin is warm and dry. She is not diaphoretic.  Psychiatric: She has a normal mood and affect. Her behavior is normal. Judgment and thought content normal.  Tenderness left side low back  Assessment/Plan: 1. Functional deficits secondary to thrombotic right basal ganglia infarct which require 3+ hours per day of interdisciplinary therapy in a comprehensive inpatient rehab setting. Physiatrist is providing close team supervision and 24 hour management of active medical problems listed below. Physiatrist and rehab team continue to assess barriers to discharge/monitor patient progress toward functional and medical goals.  FIM: FIM - Bathing Bathing Steps Patient Completed: Chest;Right Arm;Left Arm;Abdomen;Front perineal area;Buttocks;Right upper leg;Left upper leg;Right lower leg (including foot);Left lower leg (including foot) Bathing: 5: Supervision: Safety issues/verbal cues  FIM - Upper Body Dressing/Undressing Upper body dressing/undressing steps patient completed: Thread/unthread right sleeve of pullover shirt/dresss;Put head through opening of pull over shirt/dress;Pull shirt over trunk;Thread/unthread left sleeve of pullover shirt/dress Upper body dressing/undressing: 5: Supervision: Safety issues/verbal cues FIM - Lower Body Dressing/Undressing Lower body dressing/undressing steps patient completed: Thread/unthread right underwear leg;Thread/unthread left underwear leg;Pull underwear up/down;Thread/unthread right pants leg;Pull pants up/down;Thread/unthread left pants leg Lower body dressing/undressing: 4: Min-Patient completed 75 plus % of tasks  FIM -  Toileting Toileting steps completed by patient: Adjust clothing prior to toileting;Performs perineal hygiene;Adjust clothing after toileting Toileting Assistive Devices: Grab bar or rail for support Toileting: 5: Supervision: Safety issues/verbal cues  FIM - Diplomatic Services operational officer Devices: Grab bars;Walker Toilet Transfers: 5-To toilet/BSC: Supervision (verbal cues/safety issues);5-From toilet/BSC: Supervision (verbal cues/safety issues)  FIM - Press photographer Assistive Devices: Arm rests;Walker Bed/Chair Transfer: 5: Supine > Sit: Supervision (verbal cues/safety issues);5: Bed > Chair or W/C: Supervision (verbal cues/safety issues)  FIM - Locomotion: Wheelchair Distance: 100 Locomotion: Wheelchair: 2: Travels 50 - 149 ft with supervision, cueing or coaxing FIM - Locomotion: Ambulation Locomotion: Ambulation Assistive Devices: Other (comment) (no AD) Ambulation/Gait Assistance: 4: Min guard Locomotion: Ambulation: 4: Travels 150 ft or more with minimal assistance (Pt.>75%)  Comprehension Comprehension Mode: Auditory Comprehension: 7-Follows complex conversation/direction: With no assist  Expression Expression Mode: Verbal Expression: 5-Expresses basic needs/ideas: With no assist  Social Interaction Social Interaction: 5-Interacts appropriately 90% of the time - Needs monitoring or encouragement for participation or interaction.  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 DVT Prophylaxis/Anticoagulation: Pharmaceutical: Lovenox  2. Pain Management: complains of left UQ pain, rib pain. ?recent fall  -heating pad  -prn tylenol  3. Mood: pt flat, but this largely due to fatigue, meds, medical issues,  etc. Team to provide ego support as possible to pt. Seems pretty motivated, and is feeling "more like herself" today.  4. Neuropsych: This patient is capable of making decisions on her own behalf.  5. New onset seizure: Continue Keppra, dilantin, and vimpat. Recheck dilantin level  6. HTN: Will monitor with bid checks.  7. Constipation: Will set bowel program.had BM yest pm                       8. LBP Kpad and sports cream mobilize, no c/o today  LOS (Days) 7 A FACE TO FACE EVALUATION WAS PERFORMED  Erick Colace 01/11/2013 7:58 AM

## 2013-01-11 NOTE — Progress Notes (Signed)
Occupational Therapy Session Note  Patient Details  Name: [name] MRN: [id number] Date of Birth: September 28, 1940  Today's Date: 01/11/2013 Time: 0900-1000 Time Calculation (min): 60 min  Short Term Goals: No short term goals set  Skilled Therapeutic Interventions/Progress Updates:      Pt seen for BADL retraining of toileting, bathing at shower level, and dressing with a focus on dynamic standing balance, mobility without the RW, and safety awareness. Pt was able to ambulate around her room to gather her supplies from cabinets, closets, and drawers, access toilet and shower, and complete all self care with supervision only.  She did not need any cuing today for safety.  Her next few sessions prior to discharge need to focus on IADL activities of simple meal prep and housekeeping.  Pt resting in recliner with call light in reach.  May be able to go home on Monday versus Tuesday after family training.   Therapy Documentation Precautions:  Precautions Precautions: Fall Precaution Comments: right hemiparesis Restrictions Weight Bearing Restrictions: No    Vital Signs: Therapy Vitals Pulse Rate: 88 BP: 128/80 mmHg Pain: Pain Assessment Pain Assessment: No/denies pain Pain Score: 0-No pain ADL: See FIM for current functional status  Therapy/Group: Individual Therapy  Ermalee Mealy 01/11/2013, 10:00 AM

## 2013-01-12 ENCOUNTER — Inpatient Hospital Stay (HOSPITAL_COMMUNITY): Payer: Medicare Other | Admitting: Occupational Therapy

## 2013-01-12 MED ORDER — ENSURE COMPLETE PO LIQD
237.0000 mL | Freq: Every day | ORAL | Status: DC
  Administered 2013-01-12 – 2013-01-14 (×3): 237 mL via ORAL

## 2013-01-12 NOTE — Progress Notes (Signed)
Subjective/Complaints: Slept till 5a, went to BR and then slept till 8a Daughter is visiting A 12 point review of systems has been performed and if not noted above is otherwise negative.    Objective: Vital Signs: Blood pressure 127/72, pulse 89, temperature 98.1 F (36.7 C), temperature source Oral, resp. rate 18, height 5\' 2"  (1.575 m), weight 43.8 kg (96 lb 9 oz), SpO2 100.00%. No results found. No results found for this basename: WBC, HGB, HCT, PLT,  in the last 72 hours  Recent Labs  01/11/13 0725  CREATININE 1.34*   CBG (last 3)  No results found for this basename: GLUCAP,  in the last 72 hours  Wt Readings from Last 3 Encounters:  01/12/13 43.8 kg (96 lb 9 oz)  01/03/13 43.046 kg (94 lb 14.4 oz)    Physical Exam:  Nursing note and vitals reviewed.  Constitutional: She appears listless. She appears cachectic. She is cooperative. No distress.  Frail appearing HENT:  Head: Normocephalic and atraumatic.  Eyes: Conjunctivae are normal. Pupils are equal, round, and reactive to light.  Neck: Normal range of motion. No JVD present. No tracheal deviation present. No thyromegaly present.  Cardiovascular: Normal rate, regular rhythm, S1 normal and S2 normal. Exam reveals gallop.  Respiratory: No respiratory distress. Lungs clear She has no wheezes. She has no rales.  GI: Soft. Bowel sounds are normal. She exhibits no distension. There is no rebound. NT Musculoskeletal: Normal range of motion. She exhibits no edema. Mild tenderness in LUQ, lower rib cage Lymphadenopathy:  She has no cervical adenopathy.  Neurological: resolved left central 7,  Left delt, bic, tricep 4+, wrist/hand 4. Decreased FMC and mild ataxia of LUE. LLE 4+HF, 4+KE, ankle 4-. RUE 4 to 4+, RLE 4+ prox and 4+ distally. No gross sensory findings. Has reasonable insight and awareness.  Skin: Skin is warm and dry. She is not diaphoretic.  Psychiatric: She has a normal mood and affect. Her behavior is normal.  Judgment and thought content normal.  Tenderness left side low back  Assessment/Plan: 1. Functional deficits secondary to thrombotic right basal ganglia infarct which require 3+ hours per day of interdisciplinary therapy in a comprehensive inpatient rehab setting. Physiatrist is providing close team supervision and 24 hour management of active medical problems listed below. Physiatrist and rehab team continue to assess barriers to discharge/monitor patient progress toward functional and medical goals.  FIM: FIM - Bathing Bathing Steps Patient Completed: Chest;Right Arm;Left Arm;Abdomen;Front perineal area;Buttocks;Right upper leg;Left upper leg;Right lower leg (including foot);Left lower leg (including foot) Bathing: 5: Supervision: Safety issues/verbal cues  FIM - Upper Body Dressing/Undressing Upper body dressing/undressing steps patient completed: Thread/unthread right sleeve of pullover shirt/dresss;Put head through opening of pull over shirt/dress;Pull shirt over trunk;Thread/unthread left sleeve of pullover shirt/dress Upper body dressing/undressing: 5: Supervision: Safety issues/verbal cues FIM - Lower Body Dressing/Undressing Lower body dressing/undressing steps patient completed: Thread/unthread right underwear leg;Thread/unthread left underwear leg;Pull underwear up/down;Thread/unthread right pants leg;Pull pants up/down;Thread/unthread left pants leg;Don/Doff left sock;Don/Doff right shoe;Don/Doff right sock;Fasten/unfasten left shoe;Fasten/unfasten right shoe;Don/Doff left shoe Lower body dressing/undressing: 5: Supervision: Safety issues/verbal cues  FIM - Toileting Toileting steps completed by patient: Adjust clothing prior to toileting;Performs perineal hygiene;Adjust clothing after toileting Toileting Assistive Devices: Grab bar or rail for support Toileting: 5: Supervision: Safety issues/verbal cues  FIM - Diplomatic Services operational officer Devices: Grab  bars;Walker Toilet Transfers: 5-To toilet/BSC: Supervision (verbal cues/safety issues);5-From toilet/BSC: Supervision (verbal cues/safety issues)  FIM - Press photographer Assistive Devices: Arm rests;Dan Humphreys  Bed/Chair Transfer: 5: Supine > Sit: Supervision (verbal cues/safety issues);5: Sit > Supine: Supervision (verbal cues/safety issues)  FIM - Locomotion: Wheelchair Distance: 100 Locomotion: Wheelchair: 0: Activity did not occur FIM - Locomotion: Ambulation Locomotion: Ambulation Assistive Devices: Emergency planning/management officer Ambulation/Gait Assistance: 5: Supervision Locomotion: Ambulation: 5: Travels 150 ft or more with supervision/safety issues  Comprehension Comprehension Mode: Auditory Comprehension: 5-Follows basic conversation/direction: With no assist  Expression Expression Mode: Verbal Expression: 5-Expresses basic needs/ideas: With no assist  Social Interaction Social Interaction: 5-Interacts appropriately 90% of the time - Needs monitoring or encouragement for participation or interaction.  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 DVT Prophylaxis/Anticoagulation: Pharmaceutical: Lovenox  2. Pain Management: no pain c/os today -heating pad  -prn tylenol  3. Mood: pt flat, but this largely due to fatigue, meds, medical issues, etc. Team to provide ego support as possible to pt. Seems pretty motivated,Looking forward to D/C 4. Neuropsych: This patient is capable of making decisions on her own behalf.  5. New onset seizure: Continue Keppra, dilantin, and vimpat. Recheck dilantin level  6. HTN: Will monitor with bid checks.  7. Constipation: Will set bowel program.had BM yest pm                       8. LBP  Kpad and sports cream mobilize, no c/o today  LOS (Days) 8 A FACE TO FACE EVALUATION WAS PERFORMED  Erick Colace 01/12/2013 9:33 AM

## 2013-01-12 NOTE — Progress Notes (Signed)
Occupational Therapy Session Note  Patient Details  Name: Mauria Asquith MRN: [id number] Date of Birth: 13-Aug-1940  Today's Date: 01/12/2013 Time: 8295-6213 Time Calculation (min): 51 min  Skilled Therapeutic Interventions/Progress Updates:    Patient seen this pm for 1:1 OT session.  Patient reports getting up this morning and taking herself to the shower and getting dressed without assistance.  Patient encouraged to call for assistance until approved for modified independence.  Patient ambulated to ADL apartment with and without single point cane and supervision.  Patient challenged to walk faster, slower, turn head to identify objects left and right.  Patient without loss of balance.  Patient stripped full size bed, and remade bed with supervision.  Patient able to carry heavy, bulky bedspread across the room with both hands, without loss of balance, taking care to not trip.  Patient able to retrieve items from floor, use both hands to stuff pillowcases on to pillows, walk frequently around bed with no more than supervision assist.  Patient having memory problems identified by family.    Therapy Documentation Precautions:  Precautions Precautions: Fall Precaution Comments: right hemiparesis Restrictions Weight Bearing Restrictions: No   Pain:  Reports no pain    See FIM for current functional status  Therapy/Group: Individual Therapy  Collier Salina 01/12/2013, 2:52 PM

## 2013-01-12 NOTE — Progress Notes (Signed)
INITIAL NUTRITION ASSESSMENT  DOCUMENTATION CODES Per approved criteria  -Underweight   INTERVENTION:  Ensure Complete daily (350 kcals, 13 gm protein per 8 fl oz bottle) RD to follow for nutrition care plan  NUTRITION DIAGNOSIS: Increased nutrient needs related to rehabilitation as evidenced by estimated nutrition needs  Goal: Pt to meet >/= 90% of their estimated nutrition needs   Monitor:  PO & supplemental intake, weight, labs, I/O's  Reason for Assessment: Malnutrition Screening Tool Report  72 y.o. female  Admitting Dx: CVA (cerebral infarction)  ASSESSMENT: Patient with history of CVA, HTN and MI; admitted 12/28/2012 with AMS and seizure; MRI of brain showed acute right basal ganglia infarction as well as chronic hemorrhage left temporal region; admitted to [facility] 12/5.  Patient finishing up with PT upon visit; states her appetite is good; no recent weight loss; although she meets underweight criteria she reports she's always been "small framed;" PO intake variable at 50-75% per flowsheet records; she feels she would benefit from an Ensure supplement daily -- will order.  Height: Ht Readings from Last 1 Encounters:  01/04/13 5\' 2"  (1.575 m)    Weight: Wt Readings from Last 1 Encounters:  01/12/13 96 lb 9 oz (43.8 kg)    Ideal Body Weight: 110 lb  % Ideal Body Weight: 87%  Wt Readings from Last 10 Encounters:  01/12/13 96 lb 9 oz (43.8 kg)  01/03/13 94 lb 14.4 oz (43.046 kg)    Usual Body Weight: 94 lb  % Usual Body Weight: 102%  BMI:  Body mass index is 17.66 kg/(m^2).  Estimated Nutritional Needs: Kcal: 1300-1500 Protein: 60-70 gm Fluid: >/= 1.5 L  Skin: Intact  Diet Order: Cardiac  EDUCATION NEEDS: -No education needs identified at this time   Intake/Output Summary (Last 24 hours) at 01/12/13 1418 Last data filed at 01/11/13 1848  Gross per 24 hour  Intake    120 ml  Output      0 ml  Net    120 ml    Labs:   Recent Labs Lab  01/07/13 0540 01/11/13 0725  NA 135  --   K 4.4  --   CL 101  --   CO2 21  --   BUN 19  --   CREATININE 1.36* 1.34*  CALCIUM 9.0  --   GLUCOSE 101*  --     Scheduled Meds: . acetaminophen  650 mg Oral BID  . aspirin EC  325 mg Oral Daily  . carvedilol  3.125 mg Oral BID WC  . enoxaparin (LOVENOX) injection  30 mg Subcutaneous Q24H  . lacosamide  100 mg Oral BID  . levETIRAcetam  1,000 mg Oral BID  . pantoprazole  40 mg Oral Daily  . phenytoin  100 mg Oral BID  . polyethylene glycol  17 g Oral BID    Continuous Infusions:   Past Medical History  Diagnosis Date  . MI (myocardial infarction)   . Hypertension     No past surgical history on file.  Maureen Chatters, RD, LDN Pager #: [phone] After-Hours Pager #: [phone]

## 2013-01-13 ENCOUNTER — Inpatient Hospital Stay (HOSPITAL_COMMUNITY): Payer: Medicare Other | Admitting: Physical Therapy

## 2013-01-13 NOTE — Progress Notes (Signed)
Subjective/Complaints: Slept till 5a, went to [place] and then slept till 8a Daughter is visiting A 12 point review of systems has been performed and if not noted above is otherwise negative.    Objective: Vital Signs: Blood pressure 131/94, pulse 93, temperature 98.2 F (36.8 C), temperature source Oral, resp. rate 18, height 5\' 2"  (1.575 m), weight 45.3 kg (99 lb 13.9 oz), SpO2 97.00%. No results found. No results found for this basename: WBC, HGB, HCT, PLT,  in the last 72 hours  Recent Labs  01/11/13 0725  CREATININE 1.34*   CBG (last 3)  No results found for this basename: GLUCAP,  in the last 72 hours  Wt Readings from Last 3 Encounters:  01/13/13 45.3 kg (99 lb 13.9 oz)  01/03/13 43.046 kg (94 lb 14.4 oz)    Physical Exam:  Nursing note and vitals reviewed.  Constitutional: She appears listless. She appears cachectic. She is cooperative. No distress.  Frail appearing HENT:  Head: Normocephalic and atraumatic.  Eyes: Conjunctivae are normal. Pupils are equal, round, and reactive to light.  Neck: Normal range of motion. No JVD present. No tracheal deviation present. No thyromegaly present.  Cardiovascular: Normal rate, regular rhythm, S1 normal and S2 normal. Exam reveals gallop.  Respiratory: No respiratory distress. Lungs clear She has no wheezes. She has no rales.  GI: Soft. Bowel sounds are normal. She exhibits no distension. There is no rebound. NT Musculoskeletal: Normal range of motion. She exhibits no edema. Mild tenderness in LUQ, lower rib cage Lymphadenopathy:  She has no cervical adenopathy.  Neurological: resolved left central 7,  Left delt, bic, tricep 4+, wrist/hand 4. Decreased FMC and mild ataxia of LUE. LLE 4+HF, 4+KE, ankle 4-. RUE 4 to 4+, RLE 4+ prox and 4+ distally. No gross sensory findings. Has reasonable insight and awareness.  Skin: Skin is warm and dry. She is not diaphoretic.  Psychiatric: She has a normal mood and affect. Her behavior is  normal. Judgment and thought content normal.  Tenderness left side low back  Assessment/Plan: 1. Functional deficits secondary to thrombotic right basal ganglia infarct which require 3+ hours per day of interdisciplinary therapy in a comprehensive inpatient rehab setting. Physiatrist is providing close team supervision and 24 hour management of active medical problems listed below. Physiatrist and rehab team continue to assess barriers to discharge/monitor patient progress toward functional and medical goals.  FIM: FIM - Bathing Bathing Steps Patient Completed: Chest;Right Arm;Left Arm;Abdomen;Front perineal area;Buttocks;Right upper leg;Left upper leg;Right lower leg (including foot);Left lower leg (including foot) Bathing: 5: Supervision: Safety issues/verbal cues  FIM - Upper Body Dressing/Undressing Upper body dressing/undressing steps patient completed: Thread/unthread right sleeve of pullover shirt/dresss;Put head through opening of pull over shirt/dress;Pull shirt over trunk;Thread/unthread left sleeve of pullover shirt/dress Upper body dressing/undressing: 5: Supervision: Safety issues/verbal cues FIM - Lower Body Dressing/Undressing Lower body dressing/undressing steps patient completed: Thread/unthread right underwear leg;Thread/unthread left underwear leg;Pull underwear up/down;Thread/unthread right pants leg;Pull pants up/down;Thread/unthread left pants leg;Don/Doff left sock;Don/Doff right shoe;Don/Doff right sock;Fasten/unfasten left shoe;Fasten/unfasten right shoe;Don/Doff left shoe Lower body dressing/undressing: 5: Supervision: Safety issues/verbal cues  FIM - Toileting Toileting steps completed by patient: Adjust clothing prior to toileting;Performs perineal hygiene;Adjust clothing after toileting Toileting Assistive Devices: Grab bar or rail for support Toileting: 5: Supervision: Safety issues/verbal cues  FIM - Diplomatic Services operational officer Devices: Grab  bars;Walker Toilet Transfers: 5-To toilet/BSC: Supervision (verbal cues/safety issues);5-From toilet/BSC: Supervision (verbal cues/safety issues)  FIM - Press photographer Assistive Devices: Arm rests;Dan Humphreys  Bed/Chair Transfer: 5: Supine > Sit: Supervision (verbal cues/safety issues);5: Sit > Supine: Supervision (verbal cues/safety issues)  FIM - Locomotion: Wheelchair Distance: 100 Locomotion: Wheelchair: 0: Activity did not occur FIM - Locomotion: Ambulation Locomotion: Ambulation Assistive Devices: Emergency planning/management officer Ambulation/Gait Assistance: 5: Supervision Locomotion: Ambulation: 5: Travels 150 ft or more with supervision/safety issues  Comprehension Comprehension Mode: Auditory Comprehension: 5-Follows basic conversation/direction: With no assist  Expression Expression Mode: Verbal Expression: 5-Expresses basic needs/ideas: With no assist  Social Interaction Social Interaction: 5-Interacts appropriately 90% of the time - Needs monitoring or encouragement for participation or interaction.  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 DVT Prophylaxis/Anticoagulation: Pharmaceutical: Lovenox  2. Pain Management: no pain c/os today -heating pad  -prn tylenol  3. Mood: pt flat, but this largely due to fatigue, meds, medical issues, etc. Team to provide ego support as possible to pt. Seems pretty motivated,Looking forward to D/C 4. Neuropsych: This patient is capable of making decisions on her own behalf.  5. New onset seizure: Continue Keppra, dilantin, and vimpat. Recheck dilantin level low, dose increased 6. HTN: Will monitor with bid checks.  7. Constipation: Will set bowel program.had BM yest pm                        8. LBP Kpad and sports cream mobilize, no c/o today  LOS (Days) 9 A FACE TO FACE EVALUATION WAS PERFORMED  Claudette Laws E 01/13/2013 9:19 AM

## 2013-01-13 NOTE — Progress Notes (Signed)
Physical Therapy Session Note  Patient Details  Name: [name] MRN: [id number] Date of Birth: 1940/06/21  Today's Date: 01/13/2013 Time: 1300-1400 Time Calculation (min): 60 min  Short Term Goals: Week 1:  PT Short Term Goal 1 (Week 1): Pt will roll R/L with side rail and min A. PT Short Term Goal 1 - Progress (Week 1): Met PT Short Term Goal 2 (Week 1): Pt will transfer supine to edge of bed, edge of bed to supine with min A.  PT Short Term Goal 2 - Progress (Week 1): Met PT Short Term Goal 3 (Week 1): Pt will perform stand pivot transfers with min A.  PT Short Term Goal 3 - Progress (Week 1): Met PT Short Term Goal 4 (Week 1): Pt will ambulate about 150 feet with LRAD and min A.  PT Short Term Goal 4 - Progress (Week 1): Met PT Short Term Goal 5 (Week 1): Pt will ascend/descend 12-14 steps with 1 rail and min A.  PT Short Term Goal 5 - Progress (Week 1): Met  Skilled Therapeutic Interventions/Progress Updates:  Pt was seen bedside in the pm. Pt transferred supine to edge of bed with side rail and S. Pt transferred sit to stand with st cane and S. Pt ambulated about 200 feet with min guard to S with occasional verbal cues. Pt ascended/descended 1 4" curb x 2 with st cane and min guard to S and verbal cues. Pt ascended/descended 11 stairs x2 with st cane and R rail with min guard and verbal cues. Performed cone taps and cross over cone taps for LE strengthening, weighting and unilateral stance. Pt required occasional min guard and verbal cues during ambulation with directions and in tight spaces.   Therapy Documentation Precautions:  Precautions Precautions: Fall Precaution Comments: right hemiparesis Restrictions Weight Bearing Restrictions: No General:   Pain: No c/o pain.    Locomotion : Ambulation Ambulation/Gait Assistance: 4: Min guard;5: Supervision   See FIM for current functional status  Therapy/Group: Individual Therapy  Rayford Halsted 01/13/2013, 3:36  PM

## 2013-01-14 ENCOUNTER — Inpatient Hospital Stay (HOSPITAL_COMMUNITY): Payer: Medicare Other | Admitting: Physical Therapy

## 2013-01-14 ENCOUNTER — Inpatient Hospital Stay (HOSPITAL_COMMUNITY): Payer: Medicare Other | Admitting: *Deleted

## 2013-01-14 ENCOUNTER — Inpatient Hospital Stay (HOSPITAL_COMMUNITY): Payer: Medicare Other

## 2013-01-14 DIAGNOSIS — G8110 Spastic hemiplegia affecting unspecified side: Secondary | ICD-10-CM

## 2013-01-14 DIAGNOSIS — I10 Essential (primary) hypertension: Secondary | ICD-10-CM

## 2013-01-14 DIAGNOSIS — I6350 Cerebral infarction due to unspecified occlusion or stenosis of unspecified cerebral artery: Secondary | ICD-10-CM

## 2013-01-14 NOTE — Progress Notes (Signed)
Occupational Therapy Discharge Summary  Patient Details  Name: Kyriana Yankee MRN: [id number] Date of Birth: [birth year]  Today's Date: 01/14/2013   Patient has met 11 of 11 long term goals due to improved activity tolerance, functional use of  LEFT upper extremity, improved attention, improved awareness and improved coordination.  Patient to discharge at overall Supervision level.  Patient's care partner requires assistance to provide the necessary physical and cognitive assistance at discharge.    Reasons goals not met:  Pt is at supervision level due to cognition.  All goals met.    Recommendation:  Patient will benefit from ongoing skilled OT services in home health setting to continue to advance functional skills in the area of iADL.  Equipment: tub bench  Reasons for discharge: treatment goals met  Patient/family agrees with progress made and goals achieved: Yes  OT Discharge Precautions/Restrictions  Precautions Precautions: Fall Precaution Comments: right hemiparesis Restrictions Weight Bearing Restrictions: No General   Vital Signs Therapy Vitals Pulse Rate: 98 Resp: 19 BP: 156/89 mmHg Patient Position, if appropriate: Sitting Oxygen Therapy SpO2: 98 % O2 Device: None (Room air) Pain Pain Assessment Pain Score: 0-No pain ADL ADL Grooming: Independent;Moderate assistance Where Assessed-Grooming: Standing at sink;Sitting at sink Upper Body Bathing: Moderate cueing Where Assessed-Upper Body Bathing: Sitting at sink;Standing at sink Lower Body Bathing: Moderate assistance Where Assessed-Lower Body Bathing: Sitting at sink;Standing at sink Upper Body Dressing: Not assessed Lower Body Dressing: Not assessed Toileting: Moderate assistance Where Assessed-Toileting: Teacher, adult education: Moderate assistance Toilet Transfer Method: Stand pivot;Ambulating Acupuncturist: Engineer, technical sales: Not assessed Tub/Shower Transfer Method: Unable  to assess Vision/Perception  Vision - History Baseline Vision: Wears glasses all the time Patient Visual Report: No change from baseline Vision - Assessment Eye Alignment: Within Functional Limits Vision Assessment: Vision not tested Perception Perception: Within Functional Limits Praxis Praxis: Intact  Cognition Overall Cognitive Status: Within Functional Limits for tasks assessed Arousal/Alertness: Awake/alert Orientation Level: Oriented X4 Attention: Sustained Sustained Attention: Appears intact Memory: Impaired Memory Impairment: Decreased recall of new information Decreased Short Term Memory: Verbal basic;Functional basic Awareness: Appears intact Awareness Impairment: Intellectual impairment Problem Solving: Impaired Problem Solving Impairment: Verbal basic;Functional basic Executive Function: Sequencing;Organizing Reasoning: Appears intact Reasoning Impairment: Verbal basic;Functional basic Sequencing: Appears intact Sequencing Impairment: Verbal basic;Functional basic Organizing: Appears intact Organizing Impairment: Verbal basic;Functional basic Decision Making: Impaired Decision Making Impairment: Verbal basic;Functional basic Initiating: Appears intact Initiating Impairment: Verbal basic;Functional basic Safety/Judgment: Impaired Sensation Sensation Light Touch: Appears Intact Proprioception: Appears Intact Coordination Gross Motor Movements are Fluid and Coordinated: Yes Motor  Motor Motor: Within Functional Limits Mobility  Transfers Sit to Stand: 6: Modified independent (Device/Increase time) Stand to Sit: 6: Modified independent (Device/Increase time)  Trunk/Postural Assessment  Cervical Assessment Cervical Assessment: Within Functional Limits Thoracic Assessment Thoracic Assessment: Within Functional Limits Lumbar Assessment Lumbar Assessment: Within Functional Limits Postural Control Postural Control: Within Functional Limits Protective  Responses: delayed balance reactions  Balance Balance Balance Assessed: No Static Sitting Balance Static Sitting - Balance Support: No upper extremity supported;Feet supported Static Sitting - Level of Assistance: 7: Independent Dynamic Sitting Balance Dynamic Sitting - Balance Support: Right upper extremity supported Dynamic Sitting - Level of Assistance: 6: Modified independent (Device/Increase time) Static Standing Balance Static Standing - Balance Support: During functional activity Extremity/Trunk Assessment RUE Assessment RUE Assessment: Within Functional Limits LUE Assessment LUE Assessment: Exceptions to [facility] LUE Strength LUE Overall Strength: Deficits LUE Overall Strength Comments:  (Strength = 4/5)  See FIM for current functional status  Humberto Seals 01/14/2013, 1:58 PM

## 2013-01-14 NOTE — Progress Notes (Signed)
Patient refused to take a night bath Patient stated "I already took a bathis morning and I'm going home tomorrow". Denis any pain. Will continue to monitor.

## 2013-01-14 NOTE — Progress Notes (Signed)
Subjective/Complaints: No new issues, slept a lot yesterday. Looking fwd to therapy today and D/C in am A 12 point review of systems has been performed and if not noted above is otherwise negative.    Objective: Vital Signs: Blood pressure 131/87, pulse 90, temperature 98.5 F (36.9 C), temperature source Oral, resp. rate 18, height 5\' 2"  (1.575 m), weight 44.9 kg (98 lb 15.8 oz), SpO2 95.00%. No results found. No results found for this basename: WBC, HGB, HCT, PLT,  in the last 72 hours  Recent Labs  01/11/13 0725  CREATININE 1.34*   CBG (last 3)  No results found for this basename: GLUCAP,  in the last 72 hours  Wt Readings from Last 3 Encounters:  01/14/13 44.9 kg (98 lb 15.8 oz)  01/03/13 43.046 kg (94 lb 14.4 oz)    Physical Exam:  Nursing note and vitals reviewed.  Constitutional: She appears listless. She appears cachectic. She is cooperative. No distress.  Frail appearing HENT:  Head: Normocephalic and atraumatic.  Eyes: Conjunctivae are normal. Pupils are equal, round, and reactive to light.  Neck: Normal range of motion. No JVD present. No tracheal deviation present. No thyromegaly present.  Cardiovascular: Normal rate, regular rhythm, S1 normal and S2 normal. Exam reveals gallop.  Respiratory: No respiratory distress. Lungs clear She has no wheezes. She has no rales.  GI: Soft. Bowel sounds are normal. She exhibits no distension. There is no rebound. NT Musculoskeletal: Normal range of motion. She exhibits no edema. Mild tenderness in LUQ, lower rib cage Lymphadenopathy:  She has no cervical adenopathy.  Neurological: resolved left central 7,  Left delt, bic, tricep 4+, wrist/hand 4. Decreased FMC and mild ataxia of LUE. LLE 4+HF, 4+KE, ankle 4-. RUE 4 to 4+, RLE 4+ prox and 4+ distally. No gross sensory findings. Has reasonable insight and awareness.  Skin: Skin is warm and dry. She is not diaphoretic.  Psychiatric: She has a normal mood and affect. Her  behavior is normal. Judgment and thought content normal.  Tenderness left side low back  Assessment/Plan: 1. Functional deficits secondary to thrombotic right basal ganglia infarct which require 3+ hours per day of interdisciplinary therapy in a comprehensive inpatient rehab setting. Physiatrist is providing close team supervision and 24 hour management of active medical problems listed below. Physiatrist and rehab team continue to assess barriers to discharge/monitor patient progress toward functional and medical goals. Should be ready for D/C 12/16  FIM: FIM - Bathing Bathing Steps Patient Completed: Chest;Right Arm;Left Arm;Abdomen;Front perineal area;Buttocks;Right upper leg;Left upper leg;Right lower leg (including foot);Left lower leg (including foot) Bathing: 5: Supervision: Safety issues/verbal cues  FIM - Upper Body Dressing/Undressing Upper body dressing/undressing steps patient completed: Thread/unthread right sleeve of pullover shirt/dresss;Put head through opening of pull over shirt/dress;Pull shirt over trunk;Thread/unthread left sleeve of pullover shirt/dress Upper body dressing/undressing: 5: Supervision: Safety issues/verbal cues FIM - Lower Body Dressing/Undressing Lower body dressing/undressing steps patient completed: Thread/unthread right underwear leg;Thread/unthread left underwear leg;Pull underwear up/down;Thread/unthread right pants leg;Pull pants up/down;Thread/unthread left pants leg;Don/Doff left sock;Don/Doff right shoe;Don/Doff right sock;Fasten/unfasten left shoe;Fasten/unfasten right shoe;Don/Doff left shoe Lower body dressing/undressing: 5: Supervision: Safety issues/verbal cues  FIM - Toileting Toileting steps completed by patient: Adjust clothing prior to toileting;Performs perineal hygiene;Adjust clothing after toileting Toileting Assistive Devices: Grab bar or rail for support Toileting: 5: Supervision: Safety issues/verbal cues  FIM - Ambulance person Devices: Grab bars;Walker Toilet Transfers: 5-To toilet/BSC: Supervision (verbal cues/safety issues);5-Set-up assist to: Apply orthosis/W/C setup  FIM -  Bed/Chair Transport planner Devices: Arm rests;Bed rails Bed/Chair Transfer: 5: Supine > Sit: Supervision (verbal cues/safety issues);5: Sit > Supine: Supervision (verbal cues/safety issues);5: Chair or W/C > Bed: Supervision (verbal cues/safety issues);5: Bed > Chair or W/C: Supervision (verbal cues/safety issues)  FIM - Locomotion: Wheelchair Distance: 100 Locomotion: Wheelchair: 0: Activity did not occur FIM - Locomotion: Ambulation Locomotion: Ambulation Assistive Devices: Emergency planning/management officer Ambulation/Gait Assistance: 4: Min guard;5: Supervision Locomotion: Ambulation: 4: Travels 150 ft or more with minimal assistance (Pt.>75%)  Comprehension Comprehension Mode: Auditory Comprehension: 5-Understands complex 90% of the time/Cues < 10% of the time  Expression Expression Mode: Verbal Expression: 5-Expresses complex 90% of the time/cues < 10% of the time  Social Interaction Social Interaction: 4-Interacts appropriately 75 - 89% of the time - Needs redirection for appropriate language or to initiate interaction.  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 DVT Prophylaxis/Anticoagulation: Pharmaceutical: Lovenox  2. Pain Management: no pain c/os today -heating pad  -prn tylenol  3. Mood: brighter etc. Team to provide ego support as possible to pt. Seems pretty motivated,Looking forward to D/C 4. Neuropsych: This patient is capable of making decisions on her own behalf.  5. New onset seizure: Continue Keppra, dilantin, and  vimpat. Recheck dilantin level low, dose increased 6. HTN: Will monitor with bid checks.  7. Constipation: Will set bowel program.had BM yest pm                       8. LBP Kpad and sports cream mobilize, no c/o today  LOS (Days) 10 A FACE TO FACE EVALUATION WAS PERFORMED  Erick Colace 01/14/2013 6:53 AM

## 2013-01-14 NOTE — Progress Notes (Signed)
Speech Language Pathology Discharge Summary & Final Treatment Note  Patient Details  Name: [name] MRN: [id number] Date of Birth: September 27, 1940  Today's Date: 01/14/2013 Time: 4098-1191 Time Calculation (min): 45 min  Skilled Therapeutic Intervention: Treatment focused on education, with no family present for session. SLP facilitated session with education regarding current cognitive functioning with handouts provided. Pt demonstrated intellectual awareness of physical and cognitive impairments with Min question cues. Pt reviewed memory strategies with Min cues for verbal problem solving related to implementation of strategies upon discharge home. All questions were answered.  Patient has met 3 of 3 long term goals.  Patient to discharge at [facility] level.  Reasons goals not met: N/A   Clinical Impression/Discharge Summary: Pt has met 3 out of 3 LTGs during this reporting period, making functional gains in cognitive function. Pt is discharging at an overall Min A level for utilization of external memory aids, basic to mildly complex problem solving, and intellectual awareness. Pt is scheduled to discharge with 24/7 supervision from daughter/son, however no family has been present for education. Handouts have been provided. Pt is scheduled to discharge 12/16, and it is recommended that she continue to receive [facility] SLP services to maximize functional independence.   Care Partner:     Type of Caregiver Assistance: Cognitive  Recommendation:  Home Health SLP;24 hour supervision/assistance  Rationale for SLP Follow Up: Maximize cognitive function and independence;Reduce caregiver burden   Equipment:   None recommended by SLP  Reasons for discharge: Treatment goals met;Discharged from hospital   Patient/Family Agrees with Progress Made and Goals Achieved: Yes   See FIM for current functional status   Maxcine Ham, M.A. CCC-SLP [phone]   Maxcine Ham 01/14/2013, 4:35  PM

## 2013-01-14 NOTE — Progress Notes (Signed)
  Occupational Therapy Note   Patient Details  Name: [name] MRN: [id number] Date of Birth: 02/26/1940 Today's Date: 01/14/2013  Time: 0900-1000  ((60 min) Pain: none Individual session  Engaged in bathing and dressing at shower level.  Ambulate to closet and gathered clothes with supervision  Pt ambulated into shower stall and sat on shower seat.   Pt. Stood to bath lower body with supervision.  Dressed with supervision and cues for clothes orientation.  Ambulated to sink and brushed teeth and combed hair   Pt. Ambulated to wc to apply lotion and don socks/shoes.   Left pt in wc with  call bell,phone within reach.      lEdwards, Carolynn Comment 01/14/2013, 9:44 AM

## 2013-01-14 NOTE — Progress Notes (Signed)
Social Work Patient ID: [name], female   DOB: May 03, 1940, 72 y.o.   MRN: [id number] Met with pt and spoke with daughter via telephone to discuss follow up therapies and DME.  Pt is walking 150 ft and PT feels no wheelchair is necessary. Pt reports: " I will not use one either."  Daughter had wanted one for home.  Both agree to cane and tub seat, will order and have here for discharge tomorrow. Will refer to home health and then transition to Op therapies.  Pt is ready to go home and her boyfriend is coming to get her tomorrow and daughter will be here Wed.

## 2013-01-14 NOTE — Progress Notes (Signed)
Physical Therapy Note  Patient Details  Name: [name] MRN: [id number] Date of Birth: December 06, 1940 Today's Date: 01/14/2013  Time 1: 728-822 54 minutes  1:1 no c/o pain.  Pt states she is ready to go home! Gait with SPC in controlled environment and home environment with obstacle negotiation, side and backward stepping with supervision, pt able to correct small LOB with backward stepping.  Stair negotiation with 1 handrail and SPC with close supervision, curb step training with supervision, cuing for sequencing.  Standing balance training on foam with tap ups, bend and reach activity with min A with and without SPC.  Walking with ball toss, bounce for balance with delayed reaction time but improved balance reactions, close supervision-min A.  Pt with good activity tolerance and improved balance and gait.  Time 2: 1300-1326 26 minutes  1:1 No c/o pain.  Pt performed and reviewed [place] HEP for balance and strengthening.  Pt able to perform with 2# weights B LEs.  Pt given handout and expresses understanding of HEP.  Pt very excited for d/c tomorrow.   [name] 01/14/2013, 8:23 AM

## 2013-01-15 MED ORDER — CARVEDILOL 3.125 MG PO TABS: 3.1250 mg | ORAL_TABLET | Freq: Two times a day (BID) | ORAL | Status: DC

## 2013-01-15 MED ORDER — PHENYTOIN 50 MG PO CHEW: 100.0000 mg | CHEWABLE_TABLET | Freq: Every day | ORAL | Status: DC

## 2013-01-15 MED ORDER — LACOSAMIDE 200 MG PO TABS: 100.0000 mg | ORAL_TABLET | Freq: Two times a day (BID) | ORAL | Status: DC

## 2013-01-15 MED ORDER — LEVETIRACETAM 1000 MG PO TABS: 1000.0000 mg | ORAL_TABLET | Freq: Two times a day (BID) | ORAL | Status: DC

## 2013-01-15 NOTE — Progress Notes (Signed)
Pt discharge home with friend and significant other. Discharge instructions provided by Marissa Nestle, PA. All questions answered, pt verbalized understanding. Pt escorted off unit in w/c with personal belonging by [place], NT.

## 2013-01-15 NOTE — Progress Notes (Signed)
Social Work Discharge Note Discharge Note  The overall goal for the admission was met for:   Discharge location: Yes-HOME WITH DAUGHTER IN [place]-24 HR SUPERVISION LEVEL  Length of Stay: Yes-11 DAYS  Discharge activity level: Yes-SUPERVISION LEVEL  Home/community participation: Yes  Services provided included: MD, RD, PT, OT, SLP, RN, Pharmacy and Neuropsych  Financial Services: Private Insurance: [facility]  Follow-up services arranged: Home Health: [facility]-PT,OT, DME: [facility] and Patient/Family has no preference for HH/DME agencies  Comments (or additional information):FAMILY EDUCATION COMPLETED AND BOTH FELT READY FOR DISCHARGE.  Patient/Family verbalized understanding of follow-up arrangements: Yes  Individual responsible for coordination of the follow-up plan: SELF & LATOYA-DAUGHTER  Confirmed correct DME delivered: Lucy Chris 01/15/2013    Lucy Chris

## 2013-01-15 NOTE — Progress Notes (Signed)
Physical Therapy Discharge Summary  Patient Details  Name: [name] MRN: [id number] Date of Birth: [birth year]  Today's Date: 01/15/2013  Patient has met 7 of 7 long term goals due to improved activity tolerance, improved balance, improved postural control, increased strength and improved coordination.  Patient to discharge at an ambulatory level Supervision.   Patient's care partner is independent to provide the necessary cognitive assistance at discharge.   Recommendation:  Patient will benefit from ongoing skilled PT services in home health setting to continue to advance safe functional mobility, address ongoing impairments in strength, balance, coordination, and minimize fall risk.  Equipment: SPC  Reasons for discharge: treatment goals met and discharge from hospital  Patient/family agrees with progress made and goals achieved: Yes  PT Discharge Precautions/Restrictions Precautions Precautions: Fall Restrictions Weight Bearing Restrictions: No Vital Signs   Pain   Vision/Perception  Vision - History Baseline Vision: Wears glasses all the time Patient Visual Report: No change from baseline Vision - Assessment Eye Alignment: Within Functional Limits Perception Perception: Within Functional Limits Praxis Praxis: Intact  Cognition Overall Cognitive Status: Impaired/Different from baseline Memory: Impaired Awareness: Impaired Awareness Impairment: Emergent impairment Problem Solving: Impaired Safety/Judgment: Impaired Sensation Sensation Light Touch: Not tested (Pt DCd from hospital ) Coordination Gross Motor Movements are Fluid and Coordinated: Not tested (Pt DCd from hospital ) Fine Motor Movements are Fluid and Coordinated: Not tested (Pt DCd from hospital ) Motor  Motor Motor: Within Functional Limits  Mobility Bed Mobility Supine to Sit: 5: Supervision Sit to Supine: 5: Supervision Transfers Stand Pivot Transfers: 5: Supervision Locomotion   Ambulation Ambulation: Yes Ambulation/Gait Assistance: 5: Supervision Ambulation Distance (Feet): 200 Feet Assistive device: Straight cane Stairs / Additional Locomotion Stairs: Yes Stairs Assistance: 5: Supervision Stairs Assistance Details (indicate cue type and reason): Sequence cues Stair Management Technique: One rail Left;Step to pattern;Forwards Number of Stairs: 20 Height of Stairs: 6 Curb: 5: Supervision Wheelchair Mobility Wheelchair Mobility: No  Trunk/Postural Assessment  Cervical Assessment Cervical Assessment: Within Functional Limits Thoracic Assessment Thoracic Assessment: Within Functional Limits Lumbar Assessment Lumbar Assessment: Within Functional Limits Postural Control Postural Control: Within Functional Limits Protective Responses: delayed balance reactions  Balance Static Sitting Balance Static Sitting - Balance Support: No upper extremity supported;Feet supported Static Sitting - Level of Assistance: 7: Independent Dynamic Sitting Balance Dynamic Sitting - Balance Support: Right upper extremity supported Dynamic Sitting - Level of Assistance: 6: Modified independent (Device/Increase time) Static Standing Balance Static Standing - Balance Support: During functional activity Static Standing - Level of Assistance: 5: Stand by assistance Extremity Assessment  RUE Assessment RUE Assessment: Within Functional Limits LUE Assessment LUE Assessment: Exceptions to [facility] LUE Strength LUE Overall Strength: Deficits (4/5 MMT) LUE Overall Strength Comments: 4/5 MMT RLE Assessment RLE Assessment: Not tested RLE Strength RLE Overall Strength: Unable to assess RLE Overall Strength Comments: UTA due to pt DC'd from hosiptal LLE Assessment LLE Assessment: Not tested LLE AROM (degrees) Overall AROM Left Lower Extremity: Unable to assess (due to pt DCd from hospital) LLE Strength LLE Overall Strength: Unable to assess (DUe to pt DCd from hospital)  See FIM  for current functional status  Deisi Salonga Virl Cagey, PT, DPT  01/15/2013, 4:26 PM

## 2013-01-15 NOTE — Discharge Summary (Signed)
Physician Discharge Summary  Patient ID: [name] MRN: [id number] DOB/AGE: 1940-12-09 72 y.o.  Admit date: 01/04/2013 Discharge date: 01/15/2013  Discharge Diagnoses:  Principal Problem:   CVA (cerebral infarction) Active Problems:   Hypertension   Seizure   Acute renal failure   Basal ganglia infarction   Discharged Condition: Stable.   Significant Diagnostic Studies:  Dg Chest 2 View  01/05/2013   CLINICAL DATA:  Pneumonia.  EXAM: CHEST  2 VIEW  COMPARISON:  01/04/2013  FINDINGS: Retrocardiac airspace opacity is improved. Right lower lobe airspace opacity is low minimally improved.  Mildly enlarged cardiopericardial silhouette, stable. Small bilateral pleural effusions. Mid thoracic compression fractures.  IMPRESSION: 1. Improved bibasilar airspace opacities compatible with improving pneumonia. Small bilateral pleural effusions persist. 2. Stable cardiomegaly.   Electronically Signed   By: [name]   On: 01/05/2013 08:15   RIGHT HIP - COMPLETE 2+ VIEW   COMPARISON: None  FINDINGS:  There is no evidence of hip fracture or dislocation. There is no  evidence of arthropathy or other focal bone abnormality.  IMPRESSION:  Negative.  Electronically Signed  By: Signa Kell M.D.  On: 01/09/2013 15:26   Labs:  Basic Metabolic Panel:     Component Value Date/Time   NA 135 01/07/2013 0540   K 4.4 01/07/2013 0540   CL 101 01/07/2013 0540   CO2 21 01/07/2013 0540   GLUCOSE 101* 01/07/2013 0540   BUN 19 01/07/2013 0540   CREATININE 1.34* 01/11/2013 0725   CALCIUM 9.0 01/07/2013 0540   GFRNONAA 39* 01/11/2013 0725   GFRAA 45* 01/11/2013 0725      CBC:    Component Value Date/Time   WBC 6.7 01/07/2013 0540   RBC 3.79* 01/07/2013 0540   HGB 12.4 01/07/2013 0540   HCT 36.1 01/07/2013 0540   PLT 457* 01/07/2013 0540   MCV 95.3 01/07/2013 0540   MCH 32.7 01/07/2013 0540   MCHC 34.3 01/07/2013 0540   RDW 15.5 01/07/2013 0540   LYMPHSABS 1.3 01/07/2013 0540   MONOABS  0.8 01/07/2013 0540   EOSABS 0.1 01/07/2013 0540   BASOSABS 0.0 01/07/2013 0540     CBG: No results found for this basename: GLUCAP,  in the last 168 hours  Brief HPI:   [name] is a 72 y.o. right-handed female with history of CVA, hypertension as well as MI who was admitted 12/28/2012 with altered mental status as well as status epilepticus. MRI of the brain showed acute 1 cm right basal ganglia infarction as well as chronic hemorrhage left temporal region.  EEG consistent with a potential area for epileptogenicity in the left parietal-occipital region. Patient loaded with Keppra/Dilantin as well as Vimpat. Patient with resultant right sided weakness with balance deficits as well as decreased memory and delayed processing.  Hospital Course: [name] was admitted to rehab 01/04/2013 for inpatient therapies to consist of PT, ST and OT at least three hours five days a week. Past admission physiatrist, therapy team and rehab RN have worked together to provide customized collaborative inpatient rehab. Blood pressures have been reasonably controlled. Follow up labs showed mild renal insufficiency and this has been stable. Dilantin levels were rechecked on 01/08/13 after being held for a week and was noted to be <2.5--albumin levels low at 2.8. Dilantin was increased to 100 mg bid and levels need to be rechecked by primary MD in 1-2 weeks. Patient has been seizure free during this stay. She has had complaints of right hip pain with reports of  decreased weight bearing.  X rays done show no acute abnormality. She has made good progress and is at supervision level overall. She will continue to receive follow up Home PT, OT, [facility] past [facility].    Rehab course: During patient's stay in rehab weekly team conferences were held to monitor patient's progress, set goals and discuss barriers to discharge.she has had improved activity tolerance, improved balance, improved postural control,  increased strength and improved coordination. She requires supervision for ADL tasks as well as mobility. She continues to require min assist for basic to mildly complex problem solving as well as intellectual awareness of deficits.   Disposition:  Home  Diet: Heart Healthy.   Special Instructions: 1. Needs dilantin level checked in next 1-2 weeks.  2. No driving till seizure free for a year.    Future Appointments Provider Department Dept Phone   02/15/2013 3:30 PM Erick Colace[phone]       Medication List    STOP taking these medications       amLODipine 10 MG tablet  Commonly known as:  NORVASC      TAKE these medications       alendronate 70 MG tablet  Commonly known as:  FOSAMAX  Take 70 mg by mouth once a week. Take with a full glass of water on an empty stomach.     aspirin 325 MG tablet  Take 325 mg by mouth daily.     carvedilol 3.125 MG tablet  Commonly known as:  COREG  Take 1 tablet (3.125 mg total) by mouth 2 (two) times daily with a meal.     DSS 100 MG Caps  Take 100 mg by mouth 2 (two) times daily.     lacosamide 200 MG Tabs tablet  Commonly known as:  VIMPAT  Take 0.5 tablets (100 mg total) by mouth 2 (two) times daily.     levETIRAcetam 1000 MG tablet  Commonly known as:  KEPPRA  Take 1 tablet (1,000 mg total) by mouth 2 (two) times daily.     phenytoin 50 MG tablet  Commonly known as:  DILANTIN  Chew 2 tablets (100 mg total) by mouth twice a day.     polyethylene glycol packet  Commonly known as:  MIRALAX / GLYCOLAX  Take 17 g by mouth daily.     senna 8.6 MG Tabs tablet  Commonly known as:  SENOKOT  Take 1 tablet (8.6 mg total) by mouth daily.           Follow-up Information   Follow up with Erick Colace, MD On 02/15/2013. (Be there at 3 pm  for 3:30 pm  appointment)    Specialty:  Physical Medicine and Rehabilitation   Contact information:   [address] [place] [address] [phone]       Follow up with Gates Rigg, MD. Call today. (for follow up in 6 weeks. )    Specialties:  Neurology, Radiology   Contact information:   [address] [place] [address] [phone]       Follow up with [name], MD. (NEEDS TO TALK WIHT BILLING OFFICE BEOFRE APPT CAN BE [facility] AWARE AND WILL PURSUE)    Specialty:  Family Medicine   Contact information:   [address] [place] [address] [phone]       Follow up with Corky Crafts., MD. Call today. (for follow  up on cardiac issues. )    Specialty:  Cardiology   Contact information:   [address] [place] [address] [phone]       Signed: Jacquelynn Cree 01/15/2013, 12:24 PM

## 2013-01-15 NOTE — Progress Notes (Signed)
Subjective/Complaints:Slept well no C/CsA 12 point review of systems has been performed and if not noted above is otherwise negative.    Objective: Vital Signs: Blood pressure 111/72, pulse 97, temperature 97.6 F (36.4 C), temperature source Oral, resp. rate 18, height 5\' 2"  (1.575 m), weight 44.4 kg (97 lb 14.2 oz), SpO2 97.00%. No results found. No results found for this basename: WBC, HGB, HCT, PLT,  in the last 72 hours No results found for this basename: NA, K, CL, CO, GLUCOSE, BUN, CREATININE, CALCIUM,  in the last 72 hours CBG (last 3)  No results found for this basename: GLUCAP,  in the last 72 hours  Wt Readings from Last 3 Encounters:  01/15/13 44.4 kg (97 lb 14.2 oz)  01/03/13 43.046 kg (94 lb 14.4 oz)    Physical Exam:  Nursing note and vitals reviewed.  Constitutional: She appears listless. She appears cachectic. She is cooperative. No distress.  Frail appearing HENT:  Head: Normocephalic and atraumatic.  Eyes: Conjunctivae are normal. Pupils are equal, round, and reactive to light.  Neck: Normal range of motion. No JVD present. No tracheal deviation present. No thyromegaly present.  Cardiovascular: Normal rate, regular rhythm, S1 normal and S2 normal.   Respiratory: No respiratory distress. Lungs clear She has no wheezes. She has no rales.  GI: Soft. Bowel sounds are normal. She exhibits no distension. There is no rebound. NT Musculoskeletal: Normal range of motion. She exhibits no edema. Mild tenderness in LUQ, lower rib cage Lymphadenopathy:  She has no cervical adenopathy.  Neurological: resolved left central 7,  Left delt, bic, tricep 4+, wrist/hand 4. Decreased FMC and mild ataxia of LUE. LLE 4+HF, 4+KE, ankle 4-. RUE 4 to 4+, RLE 4+ prox and 4+ distally. No gross sensory findings. Has reasonable insight and awareness.  Skin: Skin is warm and dry. She is not diaphoretic.  Psychiatric: She has a normal mood and affect. Her behavior is normal. Judgment and  thought content normal.  Tenderness left side low back  Assessment/Plan: 1. Functional deficits secondary to thrombotic right basal ganglia infarct which require 3+ hours per day of interdisciplinary therapy in a comprehensive inpatient rehab setting. Stable for D/C today F/u PCP in 1-2 weeks F/u PM&R 3 weeks, although this may be an issue since pt moving to [place] See D/C summary See D/C instructions  FIM: FIM - Bathing Bathing Steps Patient Completed: Chest;Right Arm;Left Arm;Abdomen;Front perineal area;Buttocks;Right upper leg;Left upper leg;Right lower leg (including foot);Left lower leg (including foot) Bathing: 5: Supervision: Safety issues/verbal cues  FIM - Upper Body Dressing/Undressing Upper body dressing/undressing steps patient completed: Thread/unthread right sleeve of pullover shirt/dresss;Put head through opening of pull over shirt/dress;Pull shirt over trunk;Thread/unthread left sleeve of pullover shirt/dress Upper body dressing/undressing: 5: Supervision: Safety issues/verbal cues FIM - Lower Body Dressing/Undressing Lower body dressing/undressing steps patient completed: Thread/unthread right underwear leg;Thread/unthread left underwear leg;Pull underwear up/down;Thread/unthread right pants leg;Pull pants up/down;Thread/unthread left pants leg;Don/Doff left sock;Don/Doff right shoe;Don/Doff right sock;Fasten/unfasten left shoe;Fasten/unfasten right shoe;Don/Doff left shoe Lower body dressing/undressing: 5: Supervision: Safety issues/verbal cues  FIM - Toileting Toileting steps completed by patient: Adjust clothing prior to toileting;Performs perineal hygiene;Adjust clothing after toileting Toileting Assistive Devices: Grab bar or rail for support Toileting: 5: Supervision: Safety issues/verbal cues  FIM - Diplomatic Services operational officer Devices: Cane;Grab bars Toilet Transfers: 5-To toilet/BSC: Supervision (verbal cues/safety issues);5-Set-up assist to:  Apply orthosis/W/C setup  FIM - Bed/Chair Transfer Bed/Chair Transfer Assistive Devices: Arm rests;Bed rails Bed/Chair Transfer: 5: Chair or W/C >  Bed: Supervision (verbal cues/safety issues);5: Bed > Chair or W/C: Supervision (verbal cues/safety issues)  FIM - Locomotion: Wheelchair Distance: 100 Locomotion: Wheelchair: 0: Activity did not occur FIM - Locomotion: Ambulation Locomotion: Ambulation Assistive Devices: Emergency planning/management officer Ambulation/Gait Assistance: 4: Min guard;5: Supervision Locomotion: Ambulation: 5: Travels 150 ft or more with supervision/safety issues  Comprehension Comprehension Mode: Auditory Comprehension: 5-Understands complex 90% of the time/Cues < 10% of the time  Expression Expression Mode: Verbal Expression: 5-Expresses complex 90% of the time/cues < 10% of the time  Social Interaction Social Interaction: 5-Interacts appropriately 90% of the time - Needs monitoring or encouragement for participation or interaction.  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 DVT Prophylaxis/Anticoagulation: Pharmaceutical: Lovenox  2. Pain Management: no pain c/os today -heating pad  -prn tylenol  3. Mood: brighter etc. Team to provide ego support as possible to pt. Seems pretty motivated,Looking forward to D/C 4. Neuropsych: This patient is capable of making decisions on her own behalf.  5. New onset seizure: Continue Keppra, dilantin, and vimpat. Recheck dilantin level low, dose increased 6. HTN: Will monitor with bid checks.  7. Constipation: Will set bowel program.had BM yest pm                       8. LBP Kpad and sports cream mobilize, no c/o today  LOS (Days) 11 A FACE TO FACE EVALUATION WAS PERFORMED  Erick Colace 01/15/2013 6:53 AM

## 2013-02-01 ENCOUNTER — Telehealth: Payer: Self-pay

## 2013-02-01 NOTE — Telephone Encounter (Signed)
May add nsg, will need appt with me next avail

## 2013-02-01 NOTE — Telephone Encounter (Signed)
Stacy(OT @ [facility]) is requesting a verbal order to eval and treat to add skilled nursing for patient. Is this okay?  FYI:: [facility] will also be transferring patient's care back to the [place] area due to patient moving this weekend. Patient has completed OT. And has a couple session of PT left.

## 2013-02-01 NOTE — Telephone Encounter (Signed)
Contacted Stacy @ [facility] to give the verbal okay to eval and treat for skilled nursing. Patient has a follow up appt with Dr. Wynn Banker on 02/15/13.

## 2013-02-11 ENCOUNTER — Encounter (HOSPITAL_COMMUNITY): Payer: Self-pay | Admitting: Emergency Medicine

## 2013-02-11 ENCOUNTER — Emergency Department (HOSPITAL_COMMUNITY): Payer: Medicare HMO

## 2013-02-11 ENCOUNTER — Inpatient Hospital Stay (HOSPITAL_COMMUNITY)
Admission: EM | Admit: 2013-02-11 | Discharge: 2013-02-14 | DRG: 152 | Disposition: A | Discharge status: Home / Self Care | Payer: Medicare HMO | Attending: Internal Medicine | Admitting: Internal Medicine

## 2013-02-11 DIAGNOSIS — N179 Acute kidney failure, unspecified: Secondary | ICD-10-CM

## 2013-02-11 DIAGNOSIS — R569 Unspecified convulsions: Secondary | ICD-10-CM

## 2013-02-11 DIAGNOSIS — R7402 Elevation of levels of lactic acid dehydrogenase (LDH): Secondary | ICD-10-CM | POA: Diagnosis present

## 2013-02-11 DIAGNOSIS — G9340 Encephalopathy, unspecified: Secondary | ICD-10-CM | POA: Diagnosis present

## 2013-02-11 DIAGNOSIS — I10 Essential (primary) hypertension: Secondary | ICD-10-CM

## 2013-02-11 DIAGNOSIS — J101 Influenza due to other identified influenza virus with other respiratory manifestations: Secondary | ICD-10-CM

## 2013-02-11 DIAGNOSIS — I639 Cerebral infarction, unspecified: Secondary | ICD-10-CM

## 2013-02-11 DIAGNOSIS — E43 Unspecified severe protein-calorie malnutrition: Secondary | ICD-10-CM

## 2013-02-11 DIAGNOSIS — Z681 Body mass index (BMI) 19 or less, adult: Secondary | ICD-10-CM | POA: Diagnosis not present

## 2013-02-11 DIAGNOSIS — R7401 Elevation of levels of liver transaminase levels: Secondary | ICD-10-CM | POA: Diagnosis present

## 2013-02-11 DIAGNOSIS — Z7982 Long term (current) use of aspirin: Secondary | ICD-10-CM

## 2013-02-11 DIAGNOSIS — K838 Other specified diseases of biliary tract: Secondary | ICD-10-CM

## 2013-02-11 DIAGNOSIS — K7460 Unspecified cirrhosis of liver: Secondary | ICD-10-CM | POA: Diagnosis present

## 2013-02-11 DIAGNOSIS — G9341 Metabolic encephalopathy: Secondary | ICD-10-CM | POA: Diagnosis present

## 2013-02-11 DIAGNOSIS — I252 Old myocardial infarction: Secondary | ICD-10-CM

## 2013-02-11 DIAGNOSIS — E872 Acidosis, unspecified: Secondary | ICD-10-CM

## 2013-02-11 DIAGNOSIS — R740 Nonspecific elevation of levels of transaminase and lactic acid dehydrogenase [LDH]: Secondary | ICD-10-CM

## 2013-02-11 DIAGNOSIS — R4182 Altered mental status, unspecified: Secondary | ICD-10-CM | POA: Diagnosis present

## 2013-02-11 DIAGNOSIS — E875 Hyperkalemia: Secondary | ICD-10-CM

## 2013-02-11 DIAGNOSIS — I6381 Other cerebral infarction due to occlusion or stenosis of small artery: Secondary | ICD-10-CM

## 2013-02-11 DIAGNOSIS — Z8673 Personal history of transient ischemic attack (TIA), and cerebral infarction without residual deficits: Secondary | ICD-10-CM

## 2013-02-11 DIAGNOSIS — E871 Hypo-osmolality and hyponatremia: Secondary | ICD-10-CM

## 2013-02-11 DIAGNOSIS — J111 Influenza due to unidentified influenza virus with other respiratory manifestations: Secondary | ICD-10-CM | POA: Diagnosis present

## 2013-02-11 DIAGNOSIS — J189 Pneumonia, unspecified organism: Secondary | ICD-10-CM

## 2013-02-11 DIAGNOSIS — F17200 Nicotine dependence, unspecified, uncomplicated: Secondary | ICD-10-CM | POA: Diagnosis present

## 2013-02-11 DIAGNOSIS — R748 Abnormal levels of other serum enzymes: Secondary | ICD-10-CM

## 2013-02-11 DIAGNOSIS — E860 Dehydration: Secondary | ICD-10-CM

## 2013-02-11 DIAGNOSIS — I1310 Hypertensive heart and chronic kidney disease without heart failure, with stage 1 through stage 4 chronic kidney disease, or unspecified chronic kidney disease: Secondary | ICD-10-CM | POA: Diagnosis present

## 2013-02-11 DIAGNOSIS — I5020 Unspecified systolic (congestive) heart failure: Secondary | ICD-10-CM | POA: Diagnosis present

## 2013-02-11 DIAGNOSIS — N19 Unspecified kidney failure: Secondary | ICD-10-CM

## 2013-02-11 DIAGNOSIS — R6889 Other general symptoms and signs: Secondary | ICD-10-CM

## 2013-02-11 HISTORY — DX: Influenza due to other identified influenza virus with other respiratory manifestations: J10.1

## 2013-02-11 HISTORY — DX: Abnormal levels of other serum enzymes: R74.8

## 2013-02-11 HISTORY — DX: Hyperkalemia: E87.5

## 2013-02-11 HISTORY — DX: Hypo-osmolality and hyponatremia: E87.1

## 2013-02-11 LAB — CBC WITH DIFFERENTIAL/PLATELET
Basophils Absolute: 0 10*3/uL (ref 0.0–0.1)
Basophils Relative: 0 % (ref 0–1)
Eosinophils Absolute: 0 10*3/uL (ref 0.0–0.7)
Eosinophils Relative: 0 % (ref 0–5)
HCT: 30.1 % — ABNORMAL LOW (ref 36.0–46.0)
Hemoglobin: 10.6 g/dL — ABNORMAL LOW (ref 12.0–15.0)
Lymphocytes Relative: 11 % — ABNORMAL LOW (ref 12–46)
Lymphs Abs: 1.4 10*3/uL (ref 0.7–4.0)
MCH: 32.9 pg (ref 26.0–34.0)
MCHC: 35.2 g/dL (ref 30.0–36.0)
MCV: 93.5 fL (ref 78.0–100.0)
Monocytes Absolute: 1.1 10*3/uL — ABNORMAL HIGH (ref 0.1–1.0)
Monocytes Relative: 8 % (ref 3–12)
Neutro Abs: 10.2 10*3/uL — ABNORMAL HIGH (ref 1.7–7.7)
Neutrophils Relative %: 80 % — ABNORMAL HIGH (ref 43–77)
Platelets: 342 10*3/uL (ref 150–400)
RBC: 3.22 MIL/uL — ABNORMAL LOW (ref 3.87–5.11)
RDW: 15.4 % (ref 11.5–15.5)
WBC: 12.7 10*3/uL — ABNORMAL HIGH (ref 4.0–10.5)

## 2013-02-11 LAB — URINALYSIS W MICROSCOPIC + REFLEX CULTURE
Glucose, UA: NEGATIVE mg/dL
Hgb urine dipstick: NEGATIVE
Ketones, ur: 15 mg/dL — AB
Nitrite: NEGATIVE
Protein, ur: >300 mg/dL — AB
Specific Gravity, Urine: 1.023 (ref 1.005–1.030)
Urobilinogen, UA: 1 mg/dL (ref 0.0–1.0)
pH: 5 (ref 5.0–8.0)

## 2013-02-11 LAB — RAPID URINE DRUG SCREEN, HOSP PERFORMED
Amphetamines: NOT DETECTED
Barbiturates: NOT DETECTED
Benzodiazepines: NOT DETECTED
Cocaine: NOT DETECTED
Opiates: NOT DETECTED
Tetrahydrocannabinol: NOT DETECTED

## 2013-02-11 LAB — POCT I-STAT TROPONIN I: Troponin i, poc: 0.1 ng/mL (ref 0.00–0.08)

## 2013-02-11 LAB — COMPREHENSIVE METABOLIC PANEL
ALT: 229 U/L — ABNORMAL HIGH (ref 0–35)
AST: 252 U/L — ABNORMAL HIGH (ref 0–37)
Albumin: 3.7 g/dL (ref 3.5–5.2)
Alkaline Phosphatase: 120 U/L — ABNORMAL HIGH (ref 39–117)
BUN: 50 mg/dL — ABNORMAL HIGH (ref 6–23)
CO2: 16 mEq/L — ABNORMAL LOW (ref 19–32)
Calcium: 9 mg/dL (ref 8.4–10.5)
Chloride: 89 mEq/L — ABNORMAL LOW (ref 96–112)
Creatinine, Ser: 2.15 mg/dL — ABNORMAL HIGH (ref 0.50–1.10)
GFR calc Af Amer: 26 mL/min — ABNORMAL LOW (ref >90)
GFR calc non Af Amer: 22 mL/min — ABNORMAL LOW (ref >90)
Glucose, Bld: 133 mg/dL — ABNORMAL HIGH (ref 70–99)
Potassium: 6.3 mEq/L — ABNORMAL HIGH (ref 3.7–5.3)
Sodium: 129 mEq/L — ABNORMAL LOW (ref 137–147)
Total Bilirubin: 0.4 mg/dL (ref 0.3–1.2)
Total Protein: 7.2 g/dL (ref 6.0–8.3)

## 2013-02-11 LAB — ETHANOL: Alcohol, Ethyl (B): <11 mg/dL (ref 0–11)

## 2013-02-11 LAB — AMMONIA: Ammonia: 31 umol/L (ref 11–60)

## 2013-02-11 LAB — CG4 I-STAT (LACTIC ACID): Lactic Acid, Venous: 4.88 mmol/L — ABNORMAL HIGH (ref 0.5–2.2)

## 2013-02-11 LAB — TROPONIN I: Troponin I: <0.3 ng/mL (ref <0.30)

## 2013-02-11 LAB — LIPASE, BLOOD: Lipase: 38 U/L (ref 11–59)

## 2013-02-11 LAB — PHENYTOIN LEVEL, TOTAL: Phenytoin Lvl: 3.7 ug/mL — ABNORMAL LOW (ref 10.0–20.0)

## 2013-02-11 LAB — CK: Total CK: 120 U/L (ref 7–177)

## 2013-02-11 MED ORDER — SODIUM POLYSTYRENE SULFONATE 15 GM/60ML PO SUSP
30.0000 g | Freq: Once | ORAL | Status: AC
  Administered 2013-02-11: 30 g via ORAL
  Filled 2013-02-11: qty 120

## 2013-02-11 MED ORDER — DEXTROSE 5 % IV SOLN
1.0000 g | Freq: Three times a day (TID) | INTRAVENOUS | Status: DC
  Filled 2013-02-11 (×2): qty 1

## 2013-02-11 MED ORDER — DEXTROSE 5 % IV SOLN
500.0000 mg | INTRAVENOUS | Status: DC
  Administered 2013-02-11: 500 mg via INTRAVENOUS
  Filled 2013-02-11 (×2): qty 0.5

## 2013-02-11 MED ORDER — SODIUM CHLORIDE 0.9 % IV BOLUS (SEPSIS)
500.0000 mL | Freq: Once | INTRAVENOUS | Status: AC
  Administered 2013-02-11: 500 mL via INTRAVENOUS

## 2013-02-11 MED ORDER — ONDANSETRON HCL 4 MG/2ML IJ SOLN
4.0000 mg | Freq: Once | INTRAMUSCULAR | Status: AC
  Administered 2013-02-11: 4 mg via INTRAVENOUS
  Filled 2013-02-11: qty 2

## 2013-02-11 MED ORDER — SODIUM CHLORIDE 0.9 % IV SOLN
Freq: Once | INTRAVENOUS | Status: AC
  Administered 2013-02-11: 21:00:00 via INTRAVENOUS

## 2013-02-11 MED ORDER — VANCOMYCIN HCL 500 MG IV SOLR: 500.0000 mg | INTRAVENOUS | Status: DC

## 2013-02-11 MED ORDER — VANCOMYCIN HCL IN DEXTROSE 1-5 GM/200ML-% IV SOLN
1000.0000 mg | Freq: Once | INTRAVENOUS | Status: AC
  Administered 2013-02-11: 1000 mg via INTRAVENOUS
  Filled 2013-02-11: qty 200

## 2013-02-11 NOTE — ED Notes (Signed)
A&O, NAD

## 2013-02-11 NOTE — ED Notes (Signed)
Patient's significant other came in and claims that patient "had a seizure on Thanksgiving and I don't want her to have one again.  She is confused.".  I discussed acuity with another charge to ensure ok to leave 3.

## 2013-02-11 NOTE — ED Notes (Signed)
Pt sleeping, O2 dropped to 81-85%, placed 3L [place]. Pts O2 now 95%. PA notified.

## 2013-02-11 NOTE — ED Notes (Signed)
Report attempted x 1

## 2013-02-11 NOTE — Progress Notes (Signed)
ANTIBIOTIC CONSULT NOTE - INITIAL  Pharmacy Consult for vancomycin Indication: rule out pneumonia  No Known Allergies  Patient Measurements:   Adjusted Body Weight:   Vital Signs: Temp: 97.1 F (36.2 C) (01/12 1755) Temp src: Rectal (01/12 1755) BP: 124/87 mmHg (01/12 1755) Pulse Rate: 87 (01/12 1755) Intake/Output from previous day:   Intake/Output from this shift:    Labs:  Recent Labs  02/11/13 1640  WBC 12.7*  HGB 10.6*  PLT 342  CREATININE 2.15*   The CrCl is unknown because both a height and weight (above a minimum accepted value) are required for this calculation. No results found for this basename: VANCOTROUGH, VANCOPEAK, VANCORANDOM, GENTTROUGH, GENTPEAK, GENTRANDOM, TOBRATROUGH, TOBRAPEAK, TOBRARND, AMIKACINPEAK, AMIKACINTROU, AMIKACIN,  in the last 72 hours   Microbiology: No results found for this or any previous visit (from the past 720 hour(s)).  Medical History: Past Medical History  Diagnosis Date  . MI (myocardial infarction)   . Hypertension     Medications:  Anti-infectives   Start     Dose/Rate Route Frequency Ordered Stop   02/13/13 2000  vancomycin (VANCOCIN) 500 mg in sodium chloride 0.9 % 100 mL IVPB     500 mg 100 mL/hr over 60 Minutes Intravenous Every 48 hours 02/11/13 1913     02/11/13 2200  ceFEPIme (MAXIPIME) 1 g in dextrose 5 % 50 mL IVPB  Status:  Discontinued     1 g 100 mL/hr over 30 Minutes Intravenous 3 times per day 02/11/13 1905 02/11/13 1911   02/11/13 2000  ceFEPIme (MAXIPIME) 500 mg in dextrose 5 % 50 mL IVPB     500 mg 100 mL/hr over 30 Minutes Intravenous Every 24 hours 02/11/13 1913     02/11/13 1915  vancomycin (VANCOCIN) IVPB 1000 mg/200 mL premix     1,000 mg 200 mL/hr over 60 Minutes Intravenous  Once 02/11/13 1913       Assessment: 72 yof presented to the ED with generalized weakness and body aches. To start empiric vancomycin + cefepime. Pt is afebrile and WBC is elevated at 12.7. Scr is elevated at 2.15.  Due to patients small size and age, her estimated CrCl <64ml/min.   Vanc 1/12>> Cefepime 1/12>>  Goal of Therapy:  Vancomycin trough level 15-20 mcg/ml  Plan:  1. Change cefepime to 500mg  IV Q24H 2. Vancomycin 1gm IV x 1 then 500mg  IV Q48H 3. F/u renal fxn, C&S, clinical status and trough at [facility], Drake Leach 02/11/2013,7:15 PM

## 2013-02-11 NOTE — ED Notes (Signed)
Phlebotomy at bedside.

## 2013-02-11 NOTE — H&P (Signed)
PCP: none   Chief Complaint:  confusion  HPI: [name] is a 73 y.o. female   has a past medical history of MI (myocardial infarction) and Hypertension.   Presented with  Family states for the past 3 days she had dry cough, Chills, and body aches.  denies any sore throat, some nausea and vomiting. Poor PO intake. She started to be confused yesterday but family were not able to talk her into coming in finally today they brought her to ER by ambulance. In ER she was found to be oriented to self only, CXR worrisome for bilateral infiltrates, Lactate of 4.88 and  Na 129 and Cr of 2.15, Her troponin was initially elevated to 0.1 but repeat was wnl. She had some vague body aches in the begining and maybe some chest pain but not currently.  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 She has been on Seizure medications and family states she have not had any repeat events.    Review of Systems:    Pertinent positives include: chills, fatigue,  abdominal pain, nausea, vomiting,  shortness of breath at  restnon-productive cough, confusion  Constitutional:  No weight loss, night sweats, Fevers, weight loss  HEENT:  No headaches, Difficulty swallowing,Tooth/dental problems,Sore throat,  No sneezing, itching, ear ache, nasal congestion, post nasal drip,  Cardio-vascular:  No chest pain, Orthopnea, PND, anasarca, dizziness, palpitations.no Bilateral lower extremity swelling  GI:  No heartburn, indigestion,diarrhea, change in bowel habits, loss of appetite, melena, blood in stool, hematemesis Resp:  no. No dyspnea on exertion, No excess mucus, no productive cough, No  No coughing up of blood.No change in color of mucus.No wheezing. Skin:  no rash or lesions. No jaundice GU:  no dysuria, change in color of urine, no urgency or frequency. No straining to urinate.  No flank pain.  Musculoskeletal:  No joint pain or no joint swelling. No decreased range of motion.  No back pain.  Psych:  No change in mood or affect. No depression or anxiety. No memory loss.  Neuro: no localizing neurological complaints, no tingling, no weakness, no double vision, no gait abnormality, no slurred speech, no   Otherwise ROS are negative except for above, 10 systems were reviewed  Past Medical History: Past Medical History  Diagnosis Date  . MI (myocardial infarction)   . Hypertension    History reviewed. No pertinent past surgical history.   Medications: Prior to Admission medications   Medication Sig Start Date End Date Taking? Authorizing Provider  aspirin 325 MG tablet Take 325 mg by mouth every morning.    Yes Historical Provider, MD  carvedilol (COREG) 3.125 MG tablet Take 1 tablet (3.125 mg total) by mouth 2 (two) times daily with a meal. 01/15/13  Yes Evlyn KannerPamela S Love, PA-C  lacosamide (VIMPAT) 200 MG TABS tablet Take 0.5 tablets (100 mg total) by mouth 2 (two) times daily. 01/15/13  Yes Evlyn KannerPamela S Love, PA-C  levETIRAcetam (KEPPRA) 1000 MG tablet Take 1 tablet (1,000 mg total) by mouth 2 (two) times daily. 01/15/13  Yes Evlyn KannerPamela S Love, PA-C  phenytoin (DILANTIN) 50 MG tablet Chew 2 tablets (100 mg total) by mouth daily. 01/15/13  Yes Evlyn KannerPamela S Love, PA-C    Allergies:  No Known Allergies  Social History:  Ambulatory walker  Or cane Lives at   Home alone but has friends   reports that she has been smoking Cigarettes.  She has been smoking about 0.00 packs per day.  She does not have any smokeless tobacco history on file. She reports that she drinks alcohol. She reports that she does not use illicit drugs.   Family History: family history includes Anemia in her daughter; Cancer in her father.    Physical Exam: Patient Vitals for the past 24 hrs:  BP Temp Temp src Pulse Resp SpO2  02/11/13 1933 128/94 mmHg - - 89 17 93 %  02/11/13 1755 124/87 mmHg 97.1 F (36.2 C) Rectal 87 20 97 %  02/11/13 1745 124/87 mmHg - - 83 16 99 %  02/11/13 1715 125/86 mmHg - -  85 26 99 %  02/11/13 1505 103/74 mmHg 97.6 F (36.4 C) Oral 84 18 100 %  02/11/13 1215 111/82 mmHg 97.1 F (36.2 C) Oral 80 16 100 %    1. General:  in No Acute distress 2. Psychological:  but not Oriented 3. Head/ENT:    Dry Mucous Membranes                          Head Non traumatic, neck supple                          Normal  Dentition 4. SKIN:   decreased Skin turgor,  Skin clean Dry and intact no rash 5. Heart: Regular rate and rhythm no Murmur, Rub or gallop 6. Lungs:  no wheezes some crackles worse at the bases   7. Abdomen: Soft, non-tender, Non distended 8. Lower extremities: no clubbing, cyanosis, or edema 9. Neurologically Grossly intact, not fully cooperative eczema appears to have slightly diminished grip on the left 10. MSK: Normal range of motion  body mass index is unknown because there is no weight on file.   Labs on Admission:   Recent Labs  02/11/13 1640  NA 129*  K 6.3*  CL 89*  CO2 16*  GLUCOSE 133*  BUN 50*  CREATININE 2.15*  CALCIUM 9.0    Recent Labs  02/11/13 1640  AST 252*  ALT 229*  ALKPHOS 120*  BILITOT 0.4  PROT 7.2  ALBUMIN 3.7    Recent Labs  02/11/13 1640  LIPASE 38    Recent Labs  02/11/13 1640  WBC 12.7*  NEUTROABS 10.2*  HGB 10.6*  HCT 30.1*  MCV 93.5  PLT 342    Recent Labs  02/11/13 1937  TROPONINI <0.30   No results found for this basename: TSH, T4TOTAL, FREET3, T3FREE, THYROIDAB,  in the last 72 hours No results found for this basename: VITAMINB12, FOLATE, FERRITIN, TIBC, IRON, RETICCTPCT,  in the last 72 hours Lab Results  Component Value Date   HGBA1C 5.2 12/29/2012    The CrCl is unknown because both a height and weight (above a minimum accepted value) are required for this calculation. ABG    Component Value Date/Time   PHART 7.339* 12/28/2012 0838   HCO3 21.9 12/28/2012 0838   TCO2 23 12/28/2012 0838   ACIDBASEDEF 4.0* 12/28/2012 0838   O2SAT 99.0 12/28/2012 0838     Lab Results   Component Value Date   DDIMER 3.87* 04/16/2012     Other results:  I have pearsonaly reviewed this: ECG REPORT  Rate:84  Rhythm: 1st degree block, intraventricular delay ST&T Change: no ischemic changes  UA no evidence of infection   Cultures:    Component Value Date/Time   SDES URINE, CLEAN CATCH 01/05/2013 0707   SPECREQUEST NONE 01/05/2013 1117  CULT  Value: NO GROWTH Performed at [facility] 01/05/2013 1610   REPTSTATUS 01/06/2013 FINAL 01/05/2013 0707       Radiological Exams on Admission: Dg Chest 2 View  02/11/2013   CLINICAL DATA:  Left-sided chest pain  EXAM: CHEST  2 VIEW  COMPARISON:  Prior radiograph from 01/05/2013  FINDINGS: Moderate cardiomegaly is similar as compared to prior exam.  Lungs are normally inflated. Bilateral pleural effusions, left greater than right, are present. There are associated bibasilar patchy and linear opacities, which may reflect atelectasis or possibly infiltrates. No overt pulmonary edema. No pneumothorax.  Osseous structures are unchanged.  IMPRESSION: 1. Persistent small bilateral pleural effusions, left greater than right. 2. Patchy bibasilar opacities, likely atelectasis, however, possible developing infiltrates could also be considered in the correct clinical setting. 3. Stable cardiomegaly without pulmonary edema.   Electronically Signed   By: Rise Mu M.D.   On: 02/11/2013 18:47   Ct Head Wo Contrast  02/11/2013   CLINICAL DATA:  Weakness and body aches. Nausea and vomiting. Altered mental status.  EXAM: CT HEAD WITHOUT CONTRAST  TECHNIQUE: Contiguous axial images were obtained from the base of the skull through the vertex without intravenous contrast.  COMPARISON:  01/27/2013.  FINDINGS: No mass lesion, mass effect, midline shift, hydrocephalus, hemorrhage. No acute territorial cortical ischemia/infarct. Atrophy and chronic ischemic white matter disease is present. Old right basal ganglia infarct seen on prior MRI.  Posterior fossa structures are within normal limits. The calvarium is intact. Intracranial atherosclerosis.  IMPRESSION: Atrophy and chronic ischemic white matter disease with old right basal ganglia lacunar infarct. No acute intracranial abnormality.   Electronically Signed   By: Andreas Newport M.D.   On: 02/11/2013 18:17    Chart has been reviewed  Assessment/Plan  73 year old female with past history of CVA he acute mental status changes, flulike illness, chest x-ray worrisome for pneumonia, hyponatremia and acute renal failure  Present on Admission:  . Encephalopathy - multifactorial, patient appears to be dehydrated and hyponatremic. She has likely a viral illness. Also has history of recent CVA which presented also with intermittent confusion. Will admit for workup given multiple abnormal labs we'll monitor and step down. Given somewhat similar presentation of prior CVA we'll obtain MRI  . Hypertension -continue home medication currently normotensive  . Acute renal failure - patient appears to be dehydrated give IV fluids obtain urine electrolytes  . Dehydration give IV fluids  . Flu-like symptoms - obtain influenza PCR start on Tamiflu  . Hyperkalemia - EKG did not show any peaked T waves, patient was given kayexalate in ER. As well as IV fluids. Will repeat in a few hours  . Hyponatremia - likely secondary to dehydration will give IV fluids follow sodium   . HCAP (healthcare-associated pneumonia) - chest x-ray worrisome for pneumonia will cover broadly given recent admission due to CVA atient did not endorseright pleural effusion and basilar collapse/consolidation  . Liver enzyme elevation - family denied any use of alcohol. Patient did not endorse any abdominal discomfort. We'll obtain ultrasound to evaluate ammonia level within normal limits. No evidence of hypertension. Elevated lactic acid no evidence of hypotension or shock. No abdominal discomfort which would indicate  hypoperfusion. We'll repeat give aggressive  IV fluids watch in Step down  Prophylaxis:  Lovenox, Protonix  CODE STATUS: FULL CODE  Other plan as per orders.  I have spent a total of 65 min on this admission extra time has been taken to complexity of admission  Ainslie Mazurek 02/11/2013, 8:40 PM

## 2013-02-11 NOTE — ED Notes (Signed)
Lab results reported to EDP. 

## 2013-02-11 NOTE — ED Notes (Signed)
Lactic Acid reported to [employer]

## 2013-02-11 NOTE — ED Notes (Signed)
Admitting physician at bedside

## 2013-02-11 NOTE — ED Notes (Signed)
Pt significant other reports pt confusion worse than normal past 2-3 days but daughter states confusion is normal. Daughter states here today for c/o dry cough, generalized body aches with weakness, n/v x 2-3 days. Emesis x 1 last night.  Pt presently denies nausea or any pain.

## 2013-02-11 NOTE — ED Notes (Signed)
Patient states generalized weakness and body aches.   Patient states N/V today, but also states she was drinking alcohol earlier today.  Patient complains of LUQ pain.

## 2013-02-11 NOTE — ED Notes (Signed)
Patients family asked for patient to be reevaluated that she is feeling worse.   No vomiting while in waiting.

## 2013-02-11 NOTE — ED Provider Notes (Signed)
CSN: [id number]     Arrival date & time 02/11/13  1218 History   First MD Initiated Contact with Patient 02/11/13 1629     Chief Complaint  Patient presents with  . Generalized Body Aches  . Nausea  . Emesis   (Consider location/radiation/quality/duration/timing/severity/associated sxs/prior Treatment) HPI [name] is a 73 y.o. female who was brought in by her family with complaint of altered mental status. Patient had a recent stroke in October of the past year, and has been in the hospital followed by rehabilitation since. According to the family when she suffered her last CVA, she had similar presentation with altered mental status which was followed by right-sided weakness. Patient's family who are here with her private area poor historians. They state that she developed upper respiratory symptoms and cough approximately 4 days ago. They state that she has had decreased appetite, increased generalized weakness in the same time period. She has had intermittent confusion for unknown time. Today patient is confused, not recognizing her family members, does not know the year, day of the week, who the president is. Although this is similar to her presentation in the past, normally patient is able to take care of herself and carry on a conversation. Upon initial presentation to emergency department patient told the nurse at triage that she had nausea vomiting and has been drinking alcohol this morning. Apparently according to the family this was not true. Patient does not drink alcohol. Patient has also been complaining of nausea and abdominal pain, however she currently denies any symptoms.  Past Medical History  Diagnosis Date  . MI (myocardial infarction)   . Hypertension    History reviewed. No pertinent past surgical history. No family history on file. History  Substance Use Topics  . Smoking status: Current Every Day Smoker    Types: Cigarettes  . Smokeless tobacco: Not on file  .  Alcohol Use: Yes   OB History   Grav Para Term Preterm Abortions TAB SAB Ect Mult Living                 Review of Systems  Unable to perform ROS: Mental status change    Allergies  Review of patient's allergies indicates no known allergies.  Home Medications   Current Outpatient Rx  Name  Route  Sig  Dispense  Refill  . aspirin 325 MG tablet   Oral   Take 325 mg by mouth every morning.          . carvedilol (COREG) 3.125 MG tablet   Oral   Take 1 tablet (3.125 mg total) by mouth 2 (two) times daily with a meal.   60 tablet   1   . lacosamide (VIMPAT) 200 MG TABS tablet   Oral   Take 0.5 tablets (100 mg total) by mouth 2 (two) times daily.   30 tablet   1   . levETIRAcetam (KEPPRA) 1000 MG tablet   Oral   Take 1 tablet (1,000 mg total) by mouth 2 (two) times daily.   60 tablet   1   . phenytoin (DILANTIN) 50 MG tablet   Oral   Chew 2 tablets (100 mg total) by mouth daily.   120 tablet   1    BP 103/74  Pulse 84  Temp(Src) 97.6 F (36.4 C) (Oral)  Resp 18  SpO2 100% Physical Exam  Nursing note and vitals reviewed. Constitutional: She appears well-developed and well-nourished. No distress.  HENT:  Head: Normocephalic.  Oral  mucosa dry  Eyes: Conjunctivae are normal.  Pupils pin point at 1-29mm, reactive  Neck: Neck supple.  Cardiovascular: Normal rate, regular rhythm and normal heart sounds.   Pulmonary/Chest: Effort normal and breath sounds normal. No respiratory distress. She has no wheezes. She has no rales.  Abdominal: Soft. Bowel sounds are normal. She exhibits no distension. There is no tenderness. There is no rebound.  Musculoskeletal: She exhibits no edema.  Neurological: She is alert.  Oriented to self only. 5/5 and equal upper and lower extremity strength bilaterally. Equal grip strength bilaterally. Normal finger to nose and heel to shin. No pronator drift.    Skin: Skin is warm and dry.  Psychiatric: She has a normal mood and affect.  Her behavior is normal.    ED Course  Procedures (including critical care time) Labs Review Labs Reviewed  CBC WITH DIFFERENTIAL - Abnormal; Notable for the following:    WBC 12.7 (*)    RBC 3.22 (*)    Hemoglobin 10.6 (*)    HCT 30.1 (*)    Neutrophils Relative % 80 (*)    Neutro Abs 10.2 (*)    Lymphocytes Relative 11 (*)    Monocytes Absolute 1.1 (*)    All other components within normal limits  COMPREHENSIVE METABOLIC PANEL - Abnormal; Notable for the following:    Sodium 129 (*)    Potassium 6.3 (*)    Chloride 89 (*)    CO2 16 (*)    Glucose, Bld 133 (*)    BUN 50 (*)    Creatinine, Ser 2.15 (*)    AST 252 (*)    ALT 229 (*)    Alkaline Phosphatase 120 (*)    GFR calc non Af Amer 22 (*)    GFR calc Af Amer 26 (*)    All other components within normal limits  URINALYSIS W MICROSCOPIC + REFLEX CULTURE - Abnormal; Notable for the following:    APPearance CLOUDY (*)    Bilirubin Urine SMALL (*)    Ketones, ur 15 (*)    Protein, ur >300 (*)    Leukocytes, UA TRACE (*)    Bacteria, UA FEW (*)    All other components within normal limits  PHENYTOIN LEVEL, TOTAL - Abnormal; Notable for the following:    Phenytoin Lvl 3.7 (*)    All other components within normal limits  POCT I-STAT TROPONIN I - Abnormal; Notable for the following:    Troponin i, poc 0.10 (*)    All other components within normal limits  CG4 I-STAT (LACTIC ACID) - Abnormal; Notable for the following:    Lactic Acid, Venous 4.88 (*)    All other components within normal limits  CULTURE, BLOOD (ROUTINE X 2)  CULTURE, BLOOD (ROUTINE X 2)  LIPASE, BLOOD  ETHANOL  AMMONIA  URINE RAPID DRUG SCREEN (HOSP PERFORMED)  TROPONIN I  CK  INFLUENZA PANEL BY PCR (TYPE A & B, H1N1)   Imaging Review Dg Chest 2 View  02/11/2013   CLINICAL DATA:  Left-sided chest pain  EXAM: CHEST  2 VIEW  COMPARISON:  Prior radiograph from 01/05/2013  FINDINGS: Moderate cardiomegaly is similar as compared to prior exam.  Lungs  are normally inflated. Bilateral pleural effusions, left greater than right, are present. There are associated bibasilar patchy and linear opacities, which may reflect atelectasis or possibly infiltrates. No overt pulmonary edema. No pneumothorax.  Osseous structures are unchanged.  IMPRESSION: 1. Persistent small bilateral pleural effusions, left greater than right. 2.  Patchy bibasilar opacities, likely atelectasis, however, possible developing infiltrates could also be considered in the correct clinical setting. 3. Stable cardiomegaly without pulmonary edema.   Electronically Signed   By: Rise MuBenjamin  McClintock M.D.   On: 02/11/2013 18:47   Ct Head Wo Contrast  02/11/2013   CLINICAL DATA:  Weakness and body aches. Nausea and vomiting. Altered mental status.  EXAM: CT HEAD WITHOUT CONTRAST  TECHNIQUE: Contiguous axial images were obtained from the base of the skull through the vertex without intravenous contrast.  COMPARISON:  01/27/2013.  FINDINGS: No mass lesion, mass effect, midline shift, hydrocephalus, hemorrhage. No acute territorial cortical ischemia/infarct. Atrophy and chronic ischemic white matter disease is present. Old right basal ganglia infarct seen on prior MRI. Posterior fossa structures are within normal limits. The calvarium is intact. Intracranial atherosclerosis.  IMPRESSION: Atrophy and chronic ischemic white matter disease with old right basal ganglia lacunar infarct. No acute intracranial abnormality.   Electronically Signed   By: Andreas NewportGeoffrey  Lamke M.D.   On: 02/11/2013 18:17    EKG Interpretation    Date/Time:  Monday February 11 2013 15:00:39 EST Ventricular Rate:  84 PR Interval:  226 QRS Duration: 136 QT Interval:  464 QTC Calculation: 548 R Axis:   -133 Text Interpretation:  Sinus rhythm with 1st degree A-V block Biatrial enlargement Left bundle branch block Confirmed by KOHUT  MD, STEPHEN ([id number]) on 02/11/2013 5:56:37 PM            MDM   1. Dehydration   2.  Healthcare-associated pneumonia   3. Renal failure   4. Hyperkalemia   5. Encephalopathy   6. HCAP (healthcare-associated pneumonia)   7. Liver enzyme elevation     4:45 PM Patient in emergency department with altered mental status, weakness. History of CVA, myocardial infarction, hypertension, seizures. Patient is only  oriented to self. She is in no distress and denies any complaints. Will get lab work, CT head, chest x-ray.   7:55 PM Patient's lactic acid elevated at 4.88, her white blood count is elevated at 12.7, her potassium is 6.3, sodium 129, glucose 133, AST and ALT elevated today. I have ordered her some IV fluids, lecture was given for hyperkalemia. No EKG changes for hyperkalemia noted. Her troponins bumped to 0.1, EKG unremarkable. I do not think this is related to ACS. Patient generally appears to be dehydrated on exam, her creatinine is 2.15 which is bone from her usual, BUN is 50. Chest x-ray showed possible early pneumonia. Patient has been coughing. Given the lab findings and chest x-ray, will start on HCAP Antibiotics given recent hospital admission. Will call triad for admission.   Filed Vitals:   02/11/13 2030 02/11/13 2130 02/11/13 2200 02/11/13 2245  BP: 124/87 126/82 130/89   Pulse: 87 88 88   Temp:      TempSrc:      Resp: 24 23 21    SpO2: 94% 96% 95% 95%      Lottie Musselatyana A Mahati Vajda, PA-C 02/11/13 2309

## 2013-02-12 ENCOUNTER — Inpatient Hospital Stay (HOSPITAL_COMMUNITY): Payer: Medicare HMO

## 2013-02-12 DIAGNOSIS — R32 Unspecified urinary incontinence: Secondary | ICD-10-CM

## 2013-02-12 DIAGNOSIS — E860 Dehydration: Secondary | ICD-10-CM

## 2013-02-12 DIAGNOSIS — K838 Other specified diseases of biliary tract: Secondary | ICD-10-CM

## 2013-02-12 DIAGNOSIS — N179 Acute kidney failure, unspecified: Secondary | ICD-10-CM

## 2013-02-12 DIAGNOSIS — M6281 Muscle weakness (generalized): Secondary | ICD-10-CM

## 2013-02-12 DIAGNOSIS — G40909 Epilepsy, unspecified, not intractable, without status epilepticus: Secondary | ICD-10-CM

## 2013-02-12 DIAGNOSIS — R6889 Other general symptoms and signs: Secondary | ICD-10-CM

## 2013-02-12 DIAGNOSIS — I69998 Other sequelae following unspecified cerebrovascular disease: Secondary | ICD-10-CM

## 2013-02-12 DIAGNOSIS — E871 Hypo-osmolality and hyponatremia: Secondary | ICD-10-CM

## 2013-02-12 HISTORY — DX: Other specified diseases of biliary tract: K83.8

## 2013-02-12 LAB — HEMOGLOBIN A1C
Hgb A1c MFr Bld: 6 % — ABNORMAL HIGH (ref <5.7)
Mean Plasma Glucose: 126 mg/dL — ABNORMAL HIGH (ref <117)

## 2013-02-12 LAB — COMPREHENSIVE METABOLIC PANEL
ALT: 257 U/L — ABNORMAL HIGH (ref 0–35)
AST: 258 U/L — ABNORMAL HIGH (ref 0–37)
Albumin: 3.8 g/dL (ref 3.5–5.2)
Alkaline Phosphatase: 141 U/L — ABNORMAL HIGH (ref 39–117)
BUN: 53 mg/dL — ABNORMAL HIGH (ref 6–23)
CO2: 18 mEq/L — ABNORMAL LOW (ref 19–32)
Calcium: 8.5 mg/dL (ref 8.4–10.5)
Chloride: 93 mEq/L — ABNORMAL LOW (ref 96–112)
Creatinine, Ser: 2.03 mg/dL — ABNORMAL HIGH (ref 0.50–1.10)
GFR calc Af Amer: 27 mL/min — ABNORMAL LOW (ref >90)
GFR calc non Af Amer: 24 mL/min — ABNORMAL LOW (ref >90)
Glucose, Bld: 110 mg/dL — ABNORMAL HIGH (ref 70–99)
Potassium: 5 mEq/L (ref 3.7–5.3)
Sodium: 134 mEq/L — ABNORMAL LOW (ref 137–147)
Total Bilirubin: 0.5 mg/dL (ref 0.3–1.2)
Total Protein: 7.4 g/dL (ref 6.0–8.3)

## 2013-02-12 LAB — CBC WITH DIFFERENTIAL/PLATELET
Basophils Absolute: 0 10*3/uL (ref 0.0–0.1)
Basophils Relative: 0 % (ref 0–1)
Eosinophils Absolute: 0 10*3/uL (ref 0.0–0.7)
Eosinophils Relative: 0 % (ref 0–5)
HCT: 32.7 % — ABNORMAL LOW (ref 36.0–46.0)
Hemoglobin: 11.7 g/dL — ABNORMAL LOW (ref 12.0–15.0)
Lymphocytes Relative: 10 % — ABNORMAL LOW (ref 12–46)
Lymphs Abs: 1.2 10*3/uL (ref 0.7–4.0)
MCH: 32.9 pg (ref 26.0–34.0)
MCHC: 35.8 g/dL (ref 30.0–36.0)
MCV: 91.9 fL (ref 78.0–100.0)
Monocytes Absolute: 0.7 10*3/uL (ref 0.1–1.0)
Monocytes Relative: 6 % (ref 3–12)
Neutro Abs: 10.1 10*3/uL — ABNORMAL HIGH (ref 1.7–7.7)
Neutrophils Relative %: 84 % — ABNORMAL HIGH (ref 43–77)
Platelets: 315 10*3/uL (ref 150–400)
RBC: 3.56 MIL/uL — ABNORMAL LOW (ref 3.87–5.11)
RDW: 16.1 % — ABNORMAL HIGH (ref 11.5–15.5)
WBC: 12 10*3/uL — ABNORMAL HIGH (ref 4.0–10.5)

## 2013-02-12 LAB — URINE MICROSCOPIC-ADD ON

## 2013-02-12 LAB — LIPID PANEL
Cholesterol: 145 mg/dL (ref 0–200)
HDL: 52 mg/dL (ref >39)
LDL Cholesterol: 66 mg/dL (ref 0–99)
Total CHOL/HDL Ratio: 2.8 RATIO
Triglycerides: 136 mg/dL (ref <150)
VLDL: 27 mg/dL (ref 0–40)

## 2013-02-12 LAB — URINALYSIS, ROUTINE W REFLEX MICROSCOPIC
Bilirubin Urine: NEGATIVE
Glucose, UA: NEGATIVE mg/dL
Ketones, ur: NEGATIVE mg/dL
Nitrite: NEGATIVE
Protein, ur: 100 mg/dL — AB
Specific Gravity, Urine: 1.022 (ref 1.005–1.030)
Urobilinogen, UA: 0.2 mg/dL (ref 0.0–1.0)
pH: 5 (ref 5.0–8.0)

## 2013-02-12 LAB — TROPONIN I
Troponin I: <0.3 ng/mL (ref <0.30)
Troponin I: <0.3 ng/mL (ref <0.30)
Troponin I: <0.3 ng/mL (ref <0.30)

## 2013-02-12 LAB — BASIC METABOLIC PANEL
BUN: 54 mg/dL — ABNORMAL HIGH (ref 6–23)
BUN: 54 mg/dL — ABNORMAL HIGH (ref 6–23)
CO2: 17 mEq/L — ABNORMAL LOW (ref 19–32)
CO2: 17 mEq/L — ABNORMAL LOW (ref 19–32)
Calcium: 7.8 mg/dL — ABNORMAL LOW (ref 8.4–10.5)
Calcium: 7.6 mg/dL — ABNORMAL LOW (ref 8.4–10.5)
Chloride: 95 mEq/L — ABNORMAL LOW (ref 96–112)
Chloride: 96 mEq/L (ref 96–112)
Creatinine, Ser: 2.01 mg/dL — ABNORMAL HIGH (ref 0.50–1.10)
Creatinine, Ser: 1.98 mg/dL — ABNORMAL HIGH (ref 0.50–1.10)
GFR calc Af Amer: 28 mL/min — ABNORMAL LOW (ref >90)
GFR calc Af Amer: 28 mL/min — ABNORMAL LOW (ref >90)
GFR calc non Af Amer: 24 mL/min — ABNORMAL LOW (ref >90)
GFR calc non Af Amer: 24 mL/min — ABNORMAL LOW (ref >90)
Glucose, Bld: 128 mg/dL — ABNORMAL HIGH (ref 70–99)
Glucose, Bld: 117 mg/dL — ABNORMAL HIGH (ref 70–99)
Potassium: 4.1 mEq/L (ref 3.7–5.3)
Potassium: 4.5 mEq/L (ref 3.7–5.3)
Sodium: 134 mEq/L — ABNORMAL LOW (ref 137–147)
Sodium: 133 mEq/L — ABNORMAL LOW (ref 137–147)

## 2013-02-12 LAB — ACETAMINOPHEN LEVEL: Acetaminophen (Tylenol), Serum: <15 ug/mL (ref 10–30)

## 2013-02-12 LAB — CREATININE, URINE, RANDOM: Creatinine, Urine: 127.16 mg/dL

## 2013-02-12 LAB — RESPIRATORY VIRUS PANEL
Adenovirus: NOT DETECTED
Influenza A H1: NOT DETECTED
Influenza A H3: DETECTED — AB
Influenza A: DETECTED — AB
Influenza B: NOT DETECTED
Metapneumovirus: NOT DETECTED
Parainfluenza 1: NOT DETECTED
Parainfluenza 2: NOT DETECTED
Parainfluenza 3: NOT DETECTED
Respiratory Syncytial Virus A: NOT DETECTED
Respiratory Syncytial Virus B: NOT DETECTED
Rhinovirus: NOT DETECTED

## 2013-02-12 LAB — INFLUENZA PANEL BY PCR (TYPE A & B)
H1N1 flu by pcr: NOT DETECTED
Influenza A By PCR: NEGATIVE
Influenza B By PCR: NEGATIVE

## 2013-02-12 LAB — GLUCOSE, CAPILLARY
Glucose-Capillary: 109 mg/dL — ABNORMAL HIGH (ref 70–99)
Glucose-Capillary: 111 mg/dL — ABNORMAL HIGH (ref 70–99)
Glucose-Capillary: 119 mg/dL — ABNORMAL HIGH (ref 70–99)
Glucose-Capillary: 156 mg/dL — ABNORMAL HIGH (ref 70–99)

## 2013-02-12 LAB — OSMOLALITY, URINE: Osmolality, Ur: 461 mOsm/kg (ref 390–1090)

## 2013-02-12 LAB — HEPATITIS PANEL, ACUTE
HCV Ab: NEGATIVE
Hep A IgM: NONREACTIVE
Hep B C IgM: NONREACTIVE
Hepatitis B Surface Ag: NEGATIVE

## 2013-02-12 LAB — MRSA PCR SCREENING: MRSA by PCR: NEGATIVE

## 2013-02-12 LAB — STREP PNEUMONIAE URINARY ANTIGEN: Strep Pneumo Urinary Antigen: NEGATIVE

## 2013-02-12 LAB — SODIUM, URINE, RANDOM: Sodium, Ur: 20 mEq/L

## 2013-02-12 LAB — LACTIC ACID, PLASMA: Lactic Acid, Venous: 3 mmol/L — ABNORMAL HIGH (ref 0.5–2.2)

## 2013-02-12 MED ORDER — SODIUM CHLORIDE 0.9 % IV SOLN
INTRAVENOUS | Status: DC
  Administered 2013-02-12 – 2013-02-14 (×4): via INTRAVENOUS

## 2013-02-12 MED ORDER — ACETAMINOPHEN 325 MG PO TABS
650.0000 mg | ORAL_TABLET | ORAL | Status: DC | PRN
  Administered 2013-02-14 (×2): 650 mg via ORAL
  Filled 2013-02-12 (×2): qty 2

## 2013-02-12 MED ORDER — PHENYTOIN 50 MG PO CHEW
100.0000 mg | CHEWABLE_TABLET | Freq: Every day | ORAL | Status: DC
  Administered 2013-02-12 – 2013-02-14 (×3): 100 mg via ORAL
  Filled 2013-02-12 (×3): qty 2

## 2013-02-12 MED ORDER — PANTOPRAZOLE SODIUM 40 MG PO TBEC
40.0000 mg | DELAYED_RELEASE_TABLET | Freq: Every day | ORAL | Status: DC
  Administered 2013-02-12 – 2013-02-14 (×3): 40 mg via ORAL
  Filled 2013-02-12 (×2): qty 1

## 2013-02-12 MED ORDER — ENSURE COMPLETE PO LIQD
237.0000 mL | Freq: Three times a day (TID) | ORAL | Status: DC
  Administered 2013-02-13 – 2013-02-14 (×5): 237 mL via ORAL

## 2013-02-12 MED ORDER — CARVEDILOL 3.125 MG PO TABS
3.1250 mg | ORAL_TABLET | Freq: Two times a day (BID) | ORAL | Status: DC
  Administered 2013-02-12 – 2013-02-14 (×6): 3.125 mg via ORAL
  Filled 2013-02-12 (×7): qty 1

## 2013-02-12 MED ORDER — LACOSAMIDE 200 MG PO TABS
100.0000 mg | ORAL_TABLET | Freq: Two times a day (BID) | ORAL | Status: DC
Start: 2013-02-12 — End: 2013-02-14
  Administered 2013-02-12 – 2013-02-13 (×4): 100 mg via ORAL
  Filled 2013-02-12 (×5): qty 1

## 2013-02-12 MED ORDER — LEVETIRACETAM 500 MG PO TABS
1000.0000 mg | ORAL_TABLET | Freq: Two times a day (BID) | ORAL | Status: DC
  Administered 2013-02-12 – 2013-02-14 (×6): 1000 mg via ORAL
  Filled 2013-02-12 (×6): qty 2

## 2013-02-12 MED ORDER — ASPIRIN 325 MG PO TABS
325.0000 mg | ORAL_TABLET | Freq: Every morning | ORAL | Status: DC
  Administered 2013-02-12 – 2013-02-14 (×3): 325 mg via ORAL
  Filled 2013-02-12 (×3): qty 1

## 2013-02-12 MED ORDER — OSELTAMIVIR PHOSPHATE 30 MG PO CAPS
30.0000 mg | ORAL_CAPSULE | Freq: Every day | ORAL | Status: DC
  Filled 2013-02-12: qty 1

## 2013-02-12 NOTE — Progress Notes (Signed)
INITIAL NUTRITION ASSESSMENT  Pt meets criteria for SEVERE MALNUTRITION in the context of acute illness as evidenced by moderate fat and muscle mass loss and suspected intake of <50% over the past 5 days.  DOCUMENTATION CODES Per approved criteria  -Severe malnutrition in the context of acute illness or injury -Underweight   INTERVENTION:  1. Ensure complete PO TID with 350 kcals and 13 grams of protein per 8 fl oz. bottle.  2. RD to continue to follow the nutrition care plan  NUTRITION DIAGNOSIS: Inadequate oral intake related to illness and pain as evidenced by meal completion of 25%.   Goal: Pt to meet >/= 90% of estimated needs.  Monitor:  PO intake, oral supplement intake, weight trends, labs  Reason for Assessment: Pt identified as underweight with a BMI of 17.  73 y.o. female  Admitting Dx: <principal problem not specified>  ASSESSMENT: Pt has hx of hypertension and MI. Pt presented with confusion, chills and body aches and some nausea and vomiting. Pt reports poor PO intake and appetite. Pt reports usually body weight of 97 lbs. Pt reports drinking 3-4 ensures a day and does like it. Usual meals for the pt contains about 2 meals a day. Per family member usual food intake includes breakfast (toast followed by an ensure) and then usually 2-3 more ensures throughout the day with a couple of bites of food in between. Pt lives alone. Pt has a meal completion of 25% for breakfast and lunch. Pt reports no issues with chewing and consuming her food. Pt is willing to take ensure complete while admitted.  Nutrition Focused Physical Exam:  Subcutaneous Fat:  Orbital Region: N/A Upper Arm Region: Mild-moderate depletion Thoracic and Lumbar Region: N/A  Muscle:  Temple Region: N/A Clavicle Bone Region: Mild-moderate depletion Clavicle and Acromion Bone Region: N/A Scapular Bone Region: Mild-moderate depletion Dorsal Hand: N/A Patellar Region: N/A Anterior Thigh Region:  N/A Posterior Calf Region: N/A  Edema: none  Height: Ht Readings from Last 1 Encounters:  02/12/13 5\' 2"  (1.575 m)    Weight: Wt Readings from Last 1 Encounters:  02/12/13 93 lb 7.6 oz (42.4 kg)    Ideal Body Weight: 110 lbs  % Ideal Body Weight: 85%  Wt Readings from Last 10 Encounters:  02/12/13 93 lb 7.6 oz (42.4 kg)  01/15/13 97 lb 14.2 oz (44.4 kg)  01/03/13 94 lb 14.4 oz (43.046 kg)    Usual Body Weight: 97 lbs  % Usual Body Weight: 96%  BMI:  Body mass index is 17.09 kg/(m^2). underweight  Estimated Nutritional Needs: Kcal: 1250-1350 Protein: 50-60 grams/day Fluid: >1.5 L/day  Skin: no issues noted  Diet Order: Carb Control Meal completion is 25% at breakfast and lunch  EDUCATION NEEDS: -No education needs identified at this time   Intake/Output Summary (Last 24 hours) at 02/12/13 1405 Last data filed at 02/12/13 1200  Gross per 24 hour  Intake 1626.67 ml  Output      0 ml  Net 1626.67 ml    Last BM: 1/13  Labs:   Recent Labs Lab 02/11/13 1640 02/12/13 0300 02/12/13 0930  NA 129* 134* 134*  K 6.3* 5.0 4.1  CL 89* 93* 95*  CO2 16* 18* 17*  BUN 50* 53* 54*  CREATININE 2.15* 2.03* 2.01*  CALCIUM 9.0 8.5 7.8*  GLUCOSE 133* 110* 128*    CBG (last 3)   Recent Labs  02/12/13 0824 02/12/13 1231  GLUCAP 109* 111*    Scheduled Meds: . aspirin  325 mg Oral q morning - 10a  . carvedilol  3.125 mg Oral BID WC  . ceFEPime (MAXIPIME) IV  500 mg Intravenous Q24H  . lacosamide  100 mg Oral BID  . levETIRAcetam  1,000 mg Oral BID  . pantoprazole  40 mg Oral Q1200  . phenytoin  100 mg Oral Daily  . [START ON 02/13/2013] vancomycin  500 mg Intravenous Q48H    Continuous Infusions: . sodium chloride 75 mL/hr at 02/12/13 1124    Past Medical History  Diagnosis Date  . MI (myocardial infarction)   . Hypertension     History reviewed. No pertinent past surgical history.  Marijean NiemannStephanie La Dietetic Student Pager: [phone]  I agree  with the above information and made appropriate revisions. Jarold MottoSamantha Taitum Alms MS, RD, LDN Pager: [phone] After-hours pager: [phone]

## 2013-02-12 NOTE — Progress Notes (Signed)
Utilization review completed.  

## 2013-02-12 NOTE — Progress Notes (Deleted)
ANTIBIOTIC CONSULT NOTE - INITIAL  Pharmacy Consult for vancomcyin Indication: pneumonia  No Known Allergies  Patient Measurements: Height: 5\' 2"  (157.5 cm) Weight: 93 lb 7.6 oz (42.4 kg) IBW/kg (Calculated) : 50.1 Adjusted Body Weight:   Vital Signs: Temp: 97.5 F (36.4 C) (01/13 1500) Temp src: Oral (01/13 1500) BP: 124/75 mmHg (01/13 1540) Pulse Rate: 85 (01/13 1540) Intake/Output from previous day: 01/12 0701 - 01/13 0700 In: 666.7 [I.V.:666.7] Out: -  Intake/Output from this shift: Total I/O In: 1350 [P.O.:480; I.V.:870] Out: -   Labs:  Recent Labs  02/11/13 1640 02/12/13 0300 02/12/13 0911 02/12/13 0930 02/12/13 1400  WBC 12.7* 12.0*  --   --   --   HGB 10.6* 11.7*  --   --   --   PLT 342 315  --   --   --   LABCREA  --   --  127.16  --   --   CREATININE 2.15* 2.03*  --  2.01* 1.98*   Estimated Creatinine Clearance: 17.7 ml/min (by C-G formula based on Cr of 1.98). No results found for this basename: VANCOTROUGH, Leodis Binet, VANCORANDOM, GENTTROUGH, GENTPEAK, GENTRANDOM, TOBRATROUGH, TOBRAPEAK, TOBRARND, AMIKACINPEAK, AMIKACINTROU, AMIKACIN,  in the last 72 hours   Microbiology: Recent Results (from the past 720 hour(s))  MRSA PCR SCREENING     Status: None   Collection Time    02/12/13  6:00 AM      Result Value Range Status   MRSA by PCR NEGATIVE  NEGATIVE Final   Comment:            The GeneXpert MRSA Assay (FDA     approved for NASAL specimens     only), is one component of a     comprehensive MRSA colonization     surveillance program. It is not     intended to diagnose MRSA     infection nor to guide or     monitor treatment for     MRSA infections.    Medical History: Past Medical History  Diagnosis Date  . MI (myocardial infarction)   . Hypertension     Medications:  Scheduled:  . aspirin  325 mg Oral q morning - 10a  . carvedilol  3.125 mg Oral BID WC  . feeding supplement (ENSURE COMPLETE)  237 mL Oral TID BM  . lacosamide   100 mg Oral BID  . levETIRAcetam  1,000 mg Oral BID  . pantoprazole  40 mg Oral Q1200  . phenytoin  100 mg Oral Daily   Infusions:  . sodium chloride 75 mL/hr at 02/12/13 1124   Assessment: 73 yo female with pneumonia will be continued on vancomycin therapy.  Patient got one dose of vancomycin 1g iv at 2109 on 02/11/13.  CrCl ~17 due to acute renal failure.  Goal of Therapy:  Vancomycin trough level 15-20 mcg/ml  Plan:  1) Start vancomycin 500mg  iv q48h, 1st dose on 02/13/13 at 2100. 2) Follow cx and sensitivity 3) Monitor renal function and check vancomycin trough when it's appropriate.  Stefany Starace, Tsz-Yin 02/12/2013,4:28 PM

## 2013-02-12 NOTE — Progress Notes (Signed)
Pt. Transferred from 3S, was placed on Tele, vital signs stable, call bell within reach and resting comfortably. Laurette Villescas, [place] Scrape

## 2013-02-12 NOTE — Progress Notes (Signed)
Patient transported via bed by Jennette Kettle, RN and Misbah, NT to [phone]. Patient's friend of the family at bedside, patient's son notified of patient moving to new bed. Report previously given to Huntley Dec, RN on 6E at 2055.

## 2013-02-12 NOTE — Progress Notes (Signed)
[facility] Progress Note [place] TEAM 1 - Stepdown/ICU TEAM   Oliver BarreAngela Baham UJW:[id number] DOB: 10/11/1940 DOA: 02/11/2013 PCP: Default, Provider, MD  Brief narrative: 73 y/o female with PMH of CVA, seizure, MI and HTN admitted with cough, chills, body aches and confusion. Noted to have acute renal failure, elevated Lactic acid, and LFTs in there ER.  CXR suggestive of b/l basilar atelectasis vs PNA Ultrasound of the liver performed for elevated LFTs reveals pneumobilia with changes consistent with cirrhosis.   Subjective: Feels weak. No GI symptoms. Dry cough persists.   Assessment/Plan: Principal Problem:   Flu-like symptoms - Influenza negative therefore, Tamiflu discontinued - suspect that this is likely a viral illness and less likely pneumonia - will d/c Vanc and Cefepime and follow clinically  Active Problems:   Encephalopathy - appears to be improving    Acute renal failure/  Dehydration - due to poor PO intake and a few episodes of vomiting as outpt -cont to hydrate but watch carefully as she has systolic CHF    Liver enzyme elevation/ Pneumobilia - suspect she may have PBC vs autoimmune hepatitis vs Primary sclerosing cholangitis  -  viral hepatitis panel negative - appreciate GI eval- MRI and further serologies ordered to evaluate   Hyperkalemia - resolved-     Hyponatremia - due to dehydration? -cont to follow with hydration    Hypertension - cont Coreg  Seizure  - cont Keppra, Dilantin and Vimpat   Code Status: Full code Family Communication: daughter at bedside Disposition Plan: to med/surg floor- PT eval- has 24 hr caretaker at home and will likely go home  Consultants: GI  Procedures: none  Antibiotics: Vanc 1/12 Cefepime 1/12  DVT prophylaxis: SCDs  Objective: Blood pressure 124/75, pulse 85, temperature 97.5 F (36.4 C), temperature source Oral, resp. rate 22, height 5\' 2"  (1.575 m), weight 42.4 kg (93 lb 7.6 oz), SpO2  99.00%.  Intake/Output Summary (Last 24 hours) at 02/12/13 1617 Last data filed at 02/12/13 1600  Gross per 24 hour  Intake 2016.67 ml  Output      0 ml  Net 2016.67 ml     Exam: General: No acute respiratory distress Lungs: Clear to auscultation bilaterally without wheezes or crackles Cardiovascular: Regular rate and rhythm without murmur gallop or rub normal S1 and S2 Abdomen: Mild tenderness in RUQ, nondistended, soft, bowel sounds positive, no rebound, no ascites, no appreciable mass Extremities: No significant cyanosis, clubbing, or edema bilateral lower extremities  Data Reviewed: Basic Metabolic Panel:  Recent Labs Lab 02/11/13 1640 02/12/13 0300 02/12/13 0930 02/12/13 1400  NA 129* 134* 134* 133*  K 6.3* 5.0 4.1 4.5  CL 89* 93* 95* 96  CO2 16* 18* 17* 17*  GLUCOSE 133* 110* 128* 117*  BUN 50* 53* 54* 54*  CREATININE 2.15* 2.03* 2.01* 1.98*  CALCIUM 9.0 8.5 7.8* 7.6*   Liver Function Tests:  Recent Labs Lab 02/11/13 1640 02/12/13 0300  AST 252* 258*  ALT 229* 257*  ALKPHOS 120* 141*  BILITOT 0.4 0.5  PROT 7.2 7.4  ALBUMIN 3.7 3.8    Recent Labs Lab 02/11/13 1640  LIPASE 38    Recent Labs Lab 02/11/13 1937  AMMONIA 31   CBC:  Recent Labs Lab 02/11/13 1640 02/12/13 0300  WBC 12.7* 12.0*  NEUTROABS 10.2* 10.1*  HGB 10.6* 11.7*  HCT 30.1* 32.7*  MCV 93.5 91.9  PLT 342 315   Cardiac Enzymes:  Recent Labs Lab 02/11/13 1937 02/11/13 2130 02/12/13 0240 02/12/13 0930  02/12/13 1430  CKTOTAL  --  120  --   --   --   TROPONINI <0.30  --  <0.30 <0.30 <0.30   BNP (last 3 results) No results found for this basename: PROBNP,  in the last 8760 hours CBG:  Recent Labs Lab 02/12/13 0824 02/12/13 1231  GLUCAP 109* 111*    Recent Results (from the past 240 hour(s))  MRSA PCR SCREENING     Status: None   Collection Time    02/12/13  6:00 AM      Result Value Range Status   MRSA by PCR NEGATIVE  NEGATIVE Final   Comment:             The GeneXpert MRSA Assay (FDA     approved for NASAL specimens     only), is one component of a     comprehensive MRSA colonization     surveillance program. It is not     intended to diagnose MRSA     infection nor to guide or     monitor treatment for     MRSA infections.     Studies:  Recent x-ray studies have been reviewed in detail by the Attending Physician  Scheduled Meds:  Scheduled Meds: . aspirin  325 mg Oral q morning - 10a  . carvedilol  3.125 mg Oral BID WC  . ceFEPime (MAXIPIME) IV  500 mg Intravenous Q24H  . lacosamide  100 mg Oral BID  . levETIRAcetam  1,000 mg Oral BID  . pantoprazole  40 mg Oral Q1200  . phenytoin  100 mg Oral Daily  . [START ON 02/13/2013] vancomycin  500 mg Intravenous Q48H   Continuous Infusions: . sodium chloride 75 mL/hr at 02/12/13 1124    Time spent on care of this patient: >35 min   Calvert Cantor, MD  [facility] Office  [phone] Pager - Text Page per Loretha Stapler as per below:  On-Call/Text Page:      [email or web address]      password [email or web address]  If 7PM-7AM, please contact night-coverage [email or web address] Password [email or web address] 02/12/2013, 4:17 PM   LOS: 1 day

## 2013-02-12 NOTE — Evaluation (Signed)
Physical Therapy Evaluation Patient Details Name: [name] MRN: [id number] DOB: Aug 15, 1940 Today's Date: 02/12/2013 Time: [id number] PT Time Calculation (min): 23 min  PT Assessment / Plan / Recommendation History of Present Illness  Pt admitted with flu like symptoms and altered mental status  Clinical Impression  Per daughter report sounds like patient is functioning near baseline. Pt does demo generalized weakness and balance impairment. Daughter aware pt  Requires 24/7 supervision for safe d/c home and has family/friends that will con't to provide 24/7 assist.    PT Assessment  Patient needs continued PT services    Follow Up Recommendations  Home health PT;Supervision/Assistance - 24 hour    Does the patient have the potential to tolerate intense rehabilitation      Barriers to Discharge        Equipment Recommendations  None recommended by PT    Recommendations for Other Services     Frequency Min 3X/week    Precautions / Restrictions Precautions Precautions: Fall Precaution Comments: droplet to rule out flu Restrictions Weight Bearing Restrictions: No   Pertinent Vitals/Pain Denies pain      Mobility  Bed Mobility Overal bed mobility: Needs Assistance Bed Mobility: Supine to Sit Supine to sit: Min assist General bed mobility comments: minA for guidance to complete task Transfers Overall transfer level: Needs assistance Transfers: Sit to/from Stand Sit to Stand: Min assist General transfer comment: minA to guide patient through transfer Ambulation/Gait Ambulation/Gait assistance: Min assist Ambulation Distance (Feet): 30 Feet Assistive device: 1 person hand held assist Gait Pattern/deviations: Step-through pattern;Narrow base of support Gait velocity: slow General Gait Details: pt unsteady with narrow base of support and bilat LE internally rotated    Exercises     PT Diagnosis: Difficulty walking;Generalized weakness  PT Problem List: Decreased  strength;Decreased activity tolerance;Decreased balance;Decreased mobility PT Treatment Interventions: DME instruction;Gait training;Stair training;Functional mobility training;Therapeutic activities;Therapeutic exercise;Neuromuscular re-education     PT Goals(Current goals can be found in the care plan section) Acute Rehab PT Goals PT Goal Formulation: With patient/family Time For Goal Achievement: 02/26/13 Potential to Achieve Goals: Good  Visit Information  Last PT Received On: 02/12/13 Assistance Needed: +1 History of Present Illness: Pt admitted with flu like symptoms and altered mental status       Prior Functioning  Home Living Family/patient expects to be discharged to:: Private residence Living Arrangements: Alone Available Help at Discharge: Family;Available 24 hours/day;Friend(s) Type of Home: House Home Access: Stairs to enter Entergy CorporationEntrance Stairs-Number of Steps: 2 Entrance Stairs-Rails: None Home Layout: One level Home Equipment: Cane - single point Additional Comments: daughter provided PLOF due to pt being poor historian. Prior Function Level of Independence: Independent with assistive device(s) Comments: used a cane, daughter reports supervision for cooking but independent with dressing/bathing.  Communication Communication: No difficulties Dominant Hand: Left    Cognition  Cognition Arousal/Alertness: Lethargic Behavior During Therapy: WFL for tasks assessed/performed    Extremity/Trunk Assessment Upper Extremity Assessment Upper Extremity Assessment: Generalized weakness Lower Extremity Assessment Lower Extremity Assessment: Generalized weakness Cervical / Trunk Assessment Cervical / Trunk Assessment: Normal   Balance Balance Overall balance assessment: Needs assistance Sitting balance-Leahy Scale: Fair General Comments General comments (skin integrity, edema, etc.): pt groggy requiring minA to maintain balance for safety  End of Session PT - End of  Session Equipment Utilized During Treatment: Gait belt Activity Tolerance: Patient tolerated treatment well Patient left: in chair;with call bell/phone within reach Nurse Communication: Mobility status  GP     [name], [name] 02/12/2013,  9:37 AM  [name], PT, DPT Pager #: [phone] Office #: [phone]

## 2013-02-12 NOTE — Consult Note (Addendum)
Referring Provider: Dr. Butler Denmark Primary Care Physician:  Default, Provider, MD   Reason for Consultation:  Elevated LFTs  HPI: [name] is a 73 y.o. female being seen for a consult due to elevated LFTs with AST 258, ALT 257, ALP 141. TB normal. She was admitted for confusion, chills, cough and body aches and poor appetite. Hx of CVA and seizure last November. She currently is oriented to person and place and initially says it is "2008" but then changes it after being asked again to 2014. Her friend is at the bedside and no family available at this time. She denies any abdominal pain, nausea, or vomiting. She is sleeping upon my arrival and difficult to arouse.    Results for IVANELLE, KEESLER (MRN [id number]) as of 02/12/2013 15:51  Ref. Range 02/12/2013 03:00  Alkaline Phosphatase Latest Range: 39-117 U/L 141 (H)  Albumin Latest Range: 3.5-5.2 g/dL 3.8  AST Latest Range: 0-37 U/L 258 (H)  ALT Latest Range: 0-35 U/L 257 (H)  Total Protein Latest Range: 6.0-8.3 g/dL 7.4  Total Bilirubin Latest Range: 0.3-1.2 mg/dL 0.5     Past Medical History  Diagnosis Date  . MI (myocardial infarction)   . Hypertension     History reviewed. No pertinent past surgical history.  Prior to Admission medications   Medication Sig Start Date End Date Taking? Authorizing Provider  aspirin 325 MG tablet Take 325 mg by mouth every morning.    Yes Historical Provider, MD  carvedilol (COREG) 3.125 MG tablet Take 1 tablet (3.125 mg total) by mouth 2 (two) times daily with a meal. 01/15/13  Yes Evlyn Kanner Love, PA-C  lacosamide (VIMPAT) 200 MG TABS tablet Take 0.5 tablets (100 mg total) by mouth 2 (two) times daily. 01/15/13  Yes Evlyn Kanner Love, PA-C  levETIRAcetam (KEPPRA) 1000 MG tablet Take 1 tablet (1,000 mg total) by mouth 2 (two) times daily. 01/15/13  Yes Evlyn Kanner Love, PA-C  phenytoin (DILANTIN) 50 MG tablet Chew 2 tablets (100 mg total) by mouth daily. 01/15/13  Yes Evlyn Kanner Love, PA-C    Scheduled  Meds: . aspirin  325 mg Oral q morning - 10a  . carvedilol  3.125 mg Oral BID WC  . ceFEPime (MAXIPIME) IV  500 mg Intravenous Q24H  . lacosamide  100 mg Oral BID  . levETIRAcetam  1,000 mg Oral BID  . pantoprazole  40 mg Oral Q1200  . phenytoin  100 mg Oral Daily  . [START ON 02/13/2013] vancomycin  500 mg Intravenous Q48H   Continuous Infusions: . sodium chloride 75 mL/hr at 02/12/13 1124   PRN Meds:.acetaminophen  Allergies as of 02/11/2013  . (No Known Allergies)    Family History  Problem Relation Age of Onset  . Cancer Father   . Anemia Daughter     History   Social History  . Marital Status: Single    Spouse Name: N/A    Number of Children: N/A  . Years of Education: N/A   Occupational History  . Not on file.   Social History Main Topics  . Smoking status: Current Every Day Smoker    Types: Cigarettes  . Smokeless tobacco: Not on file  . Alcohol Use: Yes     Comment: occasional  . Drug Use: No  . Sexual Activity: Not on file   Other Topics Concern  . Not on file   Social History Narrative  . No narrative on file    Review of Systems: All negative from GI standpoint  except as stated above in HPI.  Physical Exam: Vital signs: Filed Vitals:   02/12/13 1232  BP: 119/82  Pulse: 87  Temp: 97.4 F (36.3 C)  Resp: 21   Last BM Date: 02/12/13 General:   Lethargic, thin, no acute distress Lungs:  Coarse breath sounds Heart:  Regular rate and rhythm; no murmurs, clicks, rubs,  or gallops. Abdomen: diffusely tender with guarding, soft, nondistended, +BS  Rectal:  Deferred Ext: no edema Neuro: oriented X 3; no asterixis  GI:  Lab Results:  Recent Labs  02/11/13 1640 02/12/13 0300  WBC 12.7* 12.0*  HGB 10.6* 11.7*  HCT 30.1* 32.7*  PLT 342 315   BMET  Recent Labs  02/12/13 0300 02/12/13 0930 02/12/13 1400  NA 134* 134* 133*  K 5.0 4.1 4.5  CL 93* 95* 96  CO2 18* 17* 17*  GLUCOSE 110* 128* 117*  BUN 53* 54* 54*  CREATININE 2.03*  2.01* 1.98*  CALCIUM 8.5 7.8* 7.6*   LFT  Recent Labs  02/12/13 0300  PROT 7.4  ALBUMIN 3.8  AST 258*  ALT 257*  ALKPHOS 141*  BILITOT 0.5   PT/INR No results found for this basename: LABPROT, INR,  in the last 72 hours   Studies/Results: Dg Chest 2 View  02/11/2013   CLINICAL DATA:  Left-sided chest pain  EXAM: CHEST  2 VIEW  COMPARISON:  Prior radiograph from 01/05/2013  FINDINGS: Moderate cardiomegaly is similar as compared to prior exam.  Lungs are normally inflated. Bilateral pleural effusions, left greater than right, are present. There are associated bibasilar patchy and linear opacities, which may reflect atelectasis or possibly infiltrates. No overt pulmonary edema. No pneumothorax.  Osseous structures are unchanged.  IMPRESSION: 1. Persistent small bilateral pleural effusions, left greater than right. 2. Patchy bibasilar opacities, likely atelectasis, however, possible developing infiltrates could also be considered in the correct clinical setting. 3. Stable cardiomegaly without pulmonary edema.   Electronically Signed   By: Rise Mu M.D.   On: 02/11/2013 18:47   Ct Head Wo Contrast  02/11/2013   CLINICAL DATA:  Weakness and body aches. Nausea and vomiting. Altered mental status.  EXAM: CT HEAD WITHOUT CONTRAST  TECHNIQUE: Contiguous axial images were obtained from the base of the skull through the vertex without intravenous contrast.  COMPARISON:  01/27/2013.  FINDINGS: No mass lesion, mass effect, midline shift, hydrocephalus, hemorrhage. No acute territorial cortical ischemia/infarct. Atrophy and chronic ischemic white matter disease is present. Old right basal ganglia infarct seen on prior MRI. Posterior fossa structures are within normal limits. The calvarium is intact. Intracranial atherosclerosis.  IMPRESSION: Atrophy and chronic ischemic white matter disease with old right basal ganglia lacunar infarct. No acute intracranial abnormality.   Electronically Signed    By: Andreas Newport M.D.   On: 02/11/2013 18:17   US Abdomen Complete  02/12/2013   CLINICAL DATA:  Renal failure and abnormal hepatic function studies.  EXAM: ULTRASOUND ABDOMEN COMPLETE  COMPARISON:  None.  FINDINGS: Gallbladder:  The gallbladder is adequately distended with no evidence of stones, wall thickening, or pericholecystic fluid. There is no positive sonographic Murphy's sign.  Common bile duct:  Diameter: 3.9 mm in diameter which is normal. No abnormal intraluminal echoes are demonstrated.  Liver:  The liver demonstrates normal contour and echogenicity. There are increased echoes however associated with the bowel ducts without significant distal shadowing. While this could reflect some air within the biliary tree fibrosis associated with the bile ducts cannot be excluded. No discrete  hepatic parenchymal mass is demonstrated. There is a to and fro portal venous flow pattern which may reflect increased portal venous pressures due to hepatic parenchymal disease.  IVC:  No abnormality visualized.  Pancreas:  Visualized portion unremarkable.  Spleen:  The spleen exhibits normal echotexture and measures 5.3 cm in greatest dimension.  Right Kidney:  Length: 9.1 cm. The echotexture of the renal cortex on the right is approximately equal to that of the adjacent liver. No hydronephrosis or parenchymal mass is demonstrated.  Left Kidney:  Length: 9.6 cm. The cortical echotexture of the left kidney is subjectively increased. There is no hydronephrosis on the left.  Abdominal aorta:  The abdominal aorta exhibits mild failure to taper of its caliber with maximal diameter 2.6 cm demonstrated below the kidneys.  Other findings:  There are bilateral pleural effusions.  Ascites is present.  IMPRESSION: 1. There are abnormal intrahepatic echoes associated with the bile ducts which may reflect pneumobilia or could reflect fibrotic change associated with the wall of the bile ducts. In addition there are findings that may  reflect increased portal venous pressure due to a to-and-fro type portal venous flow direction on Doppler interrogation. 2. The gallbladder exhibits no evidence of stones. The common bile duct is not abnormally dilated. 3. The echotexture of both kidneys is increased suggesting medical renal disease. No hydronephrosis is demonstrated. 4. There is atherosclerotic failure to taper the caliber of the abdominal aorta with maximal diameter demonstrated distally of 2.6 cm. 5. There is ascites and there are bilateral pleural effusions. 6. Follow-up MRI of the abdomen may be a useful next diagnostic imaging step.   Electronically Signed   By: David  [place]azil[place]   On: 02/12/2013 08:21    Impression/Plan: 73 yo with elevated transaminases and changes in the intrahepatic bile ducts on U/S of unclear etiology. Viral hepatitis panel negative. Outpt meds (seizure meds) could be causing cholestasis.  Question autoimmune hepatitis vs. Primary biliary cirrhosis vs. Primary sclerosing cholangitis. Doubt biliary stones. Needs a CT vs MRI and per radiology recs will order MRI of abdomen as the next step in addition to autoimmune and viral serologies.     LOS: 1 day   Natsha Guidry C.  02/12/2013, 3:47 PM

## 2013-02-13 ENCOUNTER — Inpatient Hospital Stay (HOSPITAL_COMMUNITY): Payer: Medicare HMO

## 2013-02-13 DIAGNOSIS — E43 Unspecified severe protein-calorie malnutrition: Secondary | ICD-10-CM | POA: Insufficient documentation

## 2013-02-13 LAB — LEGIONELLA ANTIGEN, URINE: Legionella Antigen, Urine: NEGATIVE

## 2013-02-13 LAB — BASIC METABOLIC PANEL
BUN: 51 mg/dL — ABNORMAL HIGH (ref 6–23)
CO2: 16 mEq/L — ABNORMAL LOW (ref 19–32)
Calcium: 7.5 mg/dL — ABNORMAL LOW (ref 8.4–10.5)
Chloride: 98 mEq/L (ref 96–112)
Creatinine, Ser: 1.78 mg/dL — ABNORMAL HIGH (ref 0.50–1.10)
GFR calc Af Amer: 32 mL/min — ABNORMAL LOW (ref >90)
GFR calc non Af Amer: 28 mL/min — ABNORMAL LOW (ref >90)
Glucose, Bld: 87 mg/dL (ref 70–99)
Potassium: 3.9 mEq/L (ref 3.7–5.3)
Sodium: 132 mEq/L — ABNORMAL LOW (ref 137–147)

## 2013-02-13 LAB — ANA: Anti Nuclear Antibody(ANA): NEGATIVE

## 2013-02-13 LAB — CBC
HCT: 27.6 % — ABNORMAL LOW (ref 36.0–46.0)
Hemoglobin: 9.9 g/dL — ABNORMAL LOW (ref 12.0–15.0)
MCH: 33.6 pg (ref 26.0–34.0)
MCHC: 35.9 g/dL (ref 30.0–36.0)
MCV: 93.6 fL (ref 78.0–100.0)
Platelets: 255 10*3/uL (ref 150–400)
RBC: 2.95 MIL/uL — ABNORMAL LOW (ref 3.87–5.11)
RDW: 16.4 % — ABNORMAL HIGH (ref 11.5–15.5)
WBC: 10.3 10*3/uL (ref 4.0–10.5)

## 2013-02-13 LAB — GLUCOSE, CAPILLARY
Glucose-Capillary: 99 mg/dL (ref 70–99)
Glucose-Capillary: 109 mg/dL — ABNORMAL HIGH (ref 70–99)
Glucose-Capillary: 195 mg/dL — ABNORMAL HIGH (ref 70–99)
Glucose-Capillary: 185 mg/dL — ABNORMAL HIGH (ref 70–99)

## 2013-02-13 LAB — ANTI-SMOOTH MUSCLE ANTIBODY, IGG: F-Actin IgG: 6 U (ref <20)

## 2013-02-13 LAB — HEPATIC FUNCTION PANEL
ALT: 176 U/L — ABNORMAL HIGH (ref 0–35)
AST: 127 U/L — ABNORMAL HIGH (ref 0–37)
Albumin: 2.8 g/dL — ABNORMAL LOW (ref 3.5–5.2)
Alkaline Phosphatase: 127 U/L — ABNORMAL HIGH (ref 39–117)
Bilirubin, Direct: <0.2 mg/dL (ref 0.0–0.3)
Total Bilirubin: 0.2 mg/dL — ABNORMAL LOW (ref 0.3–1.2)
Total Protein: 5.7 g/dL — ABNORMAL LOW (ref 6.0–8.3)

## 2013-02-13 MED ORDER — OSELTAMIVIR PHOSPHATE 75 MG PO CAPS: 75.0000 mg | ORAL_CAPSULE | Freq: Two times a day (BID) | ORAL | Status: DC

## 2013-02-13 MED ORDER — OSELTAMIVIR PHOSPHATE 30 MG PO CAPS
30.0000 mg | ORAL_CAPSULE | Freq: Every day | ORAL | Status: DC
  Administered 2013-02-13 – 2013-02-14 (×2): 30 mg via ORAL
  Filled 2013-02-13 (×3): qty 1

## 2013-02-13 MED ORDER — GADOBENATE DIMEGLUMINE 529 MG/ML IV SOLN
5.0000 mL | Freq: Once | INTRAVENOUS | Status: AC
  Administered 2013-02-13: 3 mL via INTRAVENOUS

## 2013-02-13 NOTE — Progress Notes (Signed)
OT Cancellation Note  Patient Details Name: [name] MRN: [id number] DOB: 1941/01/04   Cancelled Treatment:    Reason Eval/Treat Not Completed: Patient at procedure or test/ unavailable. Pt off floor for MRI, will re attempt later today as time allows  Galen Manila 02/13/2013, 11:32 AM

## 2013-02-13 NOTE — Progress Notes (Signed)
Patient ID: [name], female   DOB: August 15, 1940, 73 y.o.   MRN: [id number]  Patient on droplet precautions now for + Influenza A. MRI/MRCP noted.   Results for CORRA, BENCH (MRN [id number]) as of 02/13/2013 14:37  Ref. Range 02/12/2013 03:00 02/12/2013 09:30 02/12/2013 14:00 02/12/2013 14:30 02/13/2013 04:15 02/13/2013 08:00  Alkaline Phosphatase Latest Range: 39-117 U/L 141 (H)     127 (H)  Albumin Latest Range: 3.5-5.2 g/dL 3.8     2.8 (L)  AST Latest Range: 0-37 U/L 258 (H)     127 (H)  ALT Latest Range: 0-35 U/L 257 (H)     176 (H)  Total Protein Latest Range: 6.0-8.3 g/dL 7.4     5.7 (L)  Bilirubin, Direct Latest Range: 0.0-0.3 mg/dL      <6.0  Indirect Bilirubin Latest Range: 0.3-0.9 mg/dL      NOT CALCULATED  Total Bilirubin Latest Range: 0.3-1.2 mg/dL 0.5     0.2 (L)    LFTs improving. MRI negative for intrahepatic or extraheptic dilation. Suspect elevated LFTs due to cholestasis from meds in the setting of her respiratory illness.  Would follow LFTs daily or every other day. No role for ERCP or EUS. Will sign off. Call if questions.

## 2013-02-13 NOTE — Evaluation (Signed)
Occupational Therapy Evaluation Patient Details Name: [name] MRN: [id number] DOB: 1940/09/27 Today's Date: 02/13/2013 Time: 4235-3614 OT Time Calculation (min): 22 min  OT Assessment / Plan / Recommendation History of present illness Pt admitted with flu like symptoms and altered mental status   Clinical Impression   Pt doing well and is at set up /sup level with UB ADLs and sup/mion guard A level with ADL mobility safety. No further acute OT services indicated at this time, pt to continue with acute PT services for functional mobility safety    OT Assessment  Patient does not need any further OT services    Follow Up Recommendations  No OT follow up;Supervision - Intermittent    Barriers to Discharge  none    Equipment Recommendations  None recommended by OT    Recommendations for Other Services    Frequency       Precautions / Restrictions Precautions Precautions: Fall Precaution Comments: droplet to rule out flu Restrictions Weight Bearing Restrictions: No   Pertinent Vitals/Pain No c/o pain    ADL  Grooming: Performed;Wash/dry hands;Wash/dry face;Supervision/safety;Min guard Where Assessed - Grooming: Unsupported standing Upper Body Bathing: Simulated;Supervision/safety;Set up Where Assessed - Upper Body Bathing: Unsupported sitting Lower Body Bathing: Simulated;Supervision/safety;Set up;Min guard Where Assessed - Lower Body Bathing: Unsupported standing Upper Body Dressing: Performed;Supervision/safety;Set up Where Assessed - Upper Body Dressing: Unsupported sitting Lower Body Dressing: Performed;Supervision/safety;Set up;Min guard Where Assessed - Lower Body Dressing: Unsupported sitting;Unsupported sit to stand Toilet Transfer: Performed;Supervision/safety;Min guard Statistician Method: Sit to Barista: Education officer, environmental and Hygiene: Performed;Supervision/safety;Min guard Where Assessed - International aid/development worker and Hygiene: Standing Tub/Shower Transfer: Supervision/safety;Min guard Web designer Method: Science writer: Grab bars;Walk in shower Equipment Used: Gait belt Transfers/Ambulation Related to ADLs: sup - min guard A for safety ADL Comments: min guard A during standing    OT Diagnosis:    OT Problem List:   OT Treatment Interventions:     OT Goals(Current goals can be found in the care plan section) Acute Rehab OT Goals Patient Stated Goal: to get out of here and go home  Visit Information  Last OT Received On: 02/13/13 Reason Eval/Treat Not Completed: Patient at procedure or test/ unavailable History of Present Illness: Pt admitted with flu like symptoms and altered mental status       Prior Functioning     Home Living Family/patient expects to be discharged to:: Private residence Living Arrangements: Alone Available Help at Discharge: Family;Available 24 hours/day;Friend(s) Type of Home: House Home Access: Stairs to enter [employer] of Steps: 2 Entrance Stairs-Rails: None Home Layout: One level Home Equipment: Cane - single point Additional Comments: daughter provided PLOF due to pt being poor historian. Prior Function Level of Independence: Independent with assistive device(s) Comments: used a cane, daughter reports supervision for cooking but independent with dressing/bathing.  Communication Communication: No difficulties Dominant Hand: Left         Vision/Perception Vision - History Baseline Vision: Wears glasses only for reading Perception Perception: Within Functional Limits   Cognition  Cognition Arousal/Alertness: Awake/alert Behavior During Therapy: WFL for tasks assessed/performed Overall Cognitive Status: Within Functional Limits for tasks assessed    Extremity/Trunk Assessment Upper Extremity Assessment Upper Extremity Assessment: Overall WFL for tasks assessed;Generalized  weakness Lower Extremity Assessment Lower Extremity Assessment: Defer to PT evaluation Cervical / Trunk Assessment Cervical / Trunk Assessment: Normal     Mobility Bed Mobility Overal bed mobility: Needs Assistance Bed Mobility: Supine  to Sit Supine to sit: Min guard Transfers Overall transfer level: Needs assistance Transfers: Sit to/from Stand Sit to Stand: Min guard     Exercise     Balance Balance Overall balance assessment: Needs assistance Sitting balance-Leahy Scale: Good Standing balance-Leahy Scale: Fair   End of Session OT - End of Session Equipment Utilized During Treatment: Gait belt Activity Tolerance: Patient tolerated treatment well Patient left: in bed;with family/visitor present;with call bell/phone within reach;with nursing/sitter in room  GO     Galen ManilaSpencer, Spiro Ausborn Jeanette 02/13/2013, 2:17 PM

## 2013-02-13 NOTE — Progress Notes (Signed)
[facility] Progress Note   [name] ZCH:[id number] DOB: Feb 20, 1940 DOA: 02/11/2013 PCP: Default, Provider, MD  Brief narrative: 73 y/o female with PMH of CVA, seizure, MI and HTN admitted with cough, chills, body aches and confusion. Noted to have acute renal failure, elevated Lactic acid, and LFTs in there ER.  CXR suggestive of b/l basilar atelectasis vs PNA Ultrasound of the liver performed for elevated LFTs reveals pneumobilia with changes consistent with cirrhosis.   Subjective: Feels weak. No GI symptoms. Dry cough persists.   Assessment/Plan: Influenza A - InfluenzaPCR negative but subsequent respiratory virus panel is positive for Flu A. -Will resume tamiflu.     Acute Metabolic Encephalopathy - Appears to have resolved at this point.    Acute renal failure/  Dehydration - due to poor PO intake and a few episodes of vomiting as outpt -Cr continues to improve.   Transaminitis - Appreciate GI input. -Serologies for PBC and PSC sent. -MRI pending as well.  Hyperkalemia - resolved-     Hyponatremia - due to dehydration? -cont to follow with hydration    Hypertension - cont Coreg  Seizure  - cont Keppra, Dilantin and Vimpat   Code Status: Full code Family Communication: patient only Disposition Plan: to be determined. Will request PT/OT evals.  Consultants: GI  Procedures: none  Antibiotics: None  DVT prophylaxis: SCDs  Objective: Blood pressure 115/96, pulse 87, temperature 97.9 F (36.6 C), temperature source Oral, resp. rate 18, height 5' 2"  (1.575 m), weight 44.77 kg (98 lb 11.2 oz), SpO2 92.00%.  Intake/Output Summary (Last 24 hours) at 02/13/13 1259 Last data filed at 02/13/13 1013  Gross per 24 hour  Intake 1446.25 ml  Output      0 ml  Net 1446.25 ml     Exam: General: No acute respiratory distress Lungs: Clear to auscultation bilaterally without wheezes or crackles Cardiovascular: Regular rate and rhythm without  murmur gallop or rub normal S1 and S2 Abdomen: Mild tenderness in RUQ, nondistended, soft, bowel sounds positive, no rebound, no ascites, no appreciable mass Extremities: No significant cyanosis, clubbing, or edema bilateral lower extremities  Data Reviewed: Basic Metabolic Panel:  Recent Labs Lab 02/11/13 1640 02/12/13 0300 02/12/13 0930 02/12/13 1400 02/13/13 0415  NA 129* 134* 134* 133* 132*  K 6.3* 5.0 4.1 4.5 3.9  CL 89* 93* 95* 96 98  CO2 16* 18* 17* 17* 16*  GLUCOSE 133* 110* 128* 117* 87  BUN 50* 53* 54* 54* 51*  CREATININE 2.15* 2.03* 2.01* 1.98* 1.78*  CALCIUM 9.0 8.5 7.8* 7.6* 7.5*   Liver Function Tests:  Recent Labs Lab 02/11/13 1640 02/12/13 0300 02/13/13 0800  AST 252* 258* 127*  ALT 229* 257* 176*  ALKPHOS 120* 141* 127*  BILITOT 0.4 0.5 0.2*  PROT 7.2 7.4 5.7*  ALBUMIN 3.7 3.8 2.8*    Recent Labs Lab 02/11/13 1640  LIPASE 38    Recent Labs Lab 02/11/13 1937  AMMONIA 31   CBC:  Recent Labs Lab 02/11/13 1640 02/12/13 0300 02/13/13 0415  WBC 12.7* 12.0* 10.3  NEUTROABS 10.2* 10.1*  --   HGB 10.6* 11.7* 9.9*  HCT 30.1* 32.7* 27.6*  MCV 93.5 91.9 93.6  PLT 342 315 255   Cardiac Enzymes:  Recent Labs Lab 02/11/13 1937 02/11/13 2130 02/12/13 0240 02/12/13 0930 02/12/13 1430  CKTOTAL  --  120  --   --   --   TROPONINI <0.30  --  <0.30 <0.30 <0.30   BNP (last 3 results) No  results found for this basename: PROBNP,  in the last 8760 hours CBG:  Recent Labs Lab 02/12/13 1231 02/12/13 1634 02/12/13 2234 02/13/13 0751 02/13/13 1248  GLUCAP 111* 119* 156* 99 109*    Recent Results (from the past 240 hour(s))  CULTURE, BLOOD (ROUTINE X 2)     Status: None   Collection Time    02/11/13  8:30 PM      Result Value Range Status   Specimen Description BLOOD HAND LEFT   Final   Special Requests BOTTLES DRAWN AEROBIC ONLY 1CC   Final   Culture  Setup Time     Final   Value: 02/12/2013 00:23     Performed at [facility]   Culture     Final   Value:        BLOOD CULTURE RECEIVED NO GROWTH TO DATE CULTURE WILL BE HELD FOR 5 DAYS BEFORE ISSUING A FINAL NEGATIVE REPORT     Performed at [facility]   Report Status PENDING   Incomplete  CULTURE, BLOOD (ROUTINE X 2)     Status: None   Collection Time    02/11/13  8:35 PM      Result Value Range Status   Specimen Description BLOOD HAND RIGHT   Final   Special Requests BOTTLES DRAWN AEROBIC ONLY 1CC   Final   Culture  Setup Time     Final   Value: 02/12/2013 00:23     Performed at [facility]   Culture     Final   Value:        BLOOD CULTURE RECEIVED NO GROWTH TO DATE CULTURE WILL BE HELD FOR 5 DAYS BEFORE ISSUING A FINAL NEGATIVE REPORT     Performed at [facility]   Report Status PENDING   Incomplete  RESPIRATORY VIRUS PANEL     Status: Abnormal   Collection Time    02/12/13  5:58 AM      Result Value Range Status   Source - RVPAN NASAL SWAB   Corrected   Comment: CORRECTED ON 01/13 AT 1756: PREVIOUSLY REPORTED AS NASAL SWAB   Respiratory Syncytial Virus A NOT DETECTED   Final   Respiratory Syncytial Virus B NOT DETECTED   Final   Influenza A DETECTED (*)  Final   Influenza B NOT DETECTED   Final   Parainfluenza 1 NOT DETECTED   Final   Parainfluenza 2 NOT DETECTED   Final   Parainfluenza 3 NOT DETECTED   Final   Metapneumovirus NOT DETECTED   Final   Rhinovirus NOT DETECTED   Final   Adenovirus NOT DETECTED   Final   Influenza A H1 NOT DETECTED   Final   Influenza A H3 DETECTED (*)  Final   Comment: (NOTE)           Normal Reference Range for each Analyte: NOT DETECTED     Testing performed using the Luminex xTAG Respiratory Viral Panel test     kit.     This test was developed and its performance characteristics determined     by [facility]. It has not been cleared or approved by the [place]     Food and Drug Administration. This test is used for clinical purposes.     It should not be regarded as  investigational or for research. This     laboratory is certified under the [place] (CLIA) as qualified  to perform high complexity     clinical laboratory testing.     Performed at [facility] PCR SCREENING     Status: None   Collection Time    02/12/13  6:00 AM      Result Value Range Status   MRSA by PCR NEGATIVE  NEGATIVE Final   Comment:            The GeneXpert MRSA Assay (FDA     approved for NASAL specimens     only), is one component of a     comprehensive MRSA colonization     surveillance program. It is not     intended to diagnose MRSA     infection nor to guide or     monitor treatment for     MRSA infections.      Scheduled Meds:  Scheduled Meds: . aspirin  325 mg Oral q morning - 10a  . carvedilol  3.125 mg Oral BID WC  . feeding supplement (ENSURE COMPLETE)  237 mL Oral TID BM  . lacosamide  100 mg Oral BID  . levETIRAcetam  1,000 mg Oral BID  . oseltamivir  30 mg Oral Daily  . pantoprazole  40 mg Oral Q1200  . phenytoin  100 mg Oral Daily   Continuous Infusions: . sodium chloride 75 mL/hr at 02/13/13 0902    Time spent on care of this patient: 35 min   Lelon Frohlich, MD Pager: [place]  [phone]   If 7PM-7AM, please contact night-coverage [email or web address] Password [email or web address] 02/13/2013, 12:59 PM   LOS: 2 days

## 2013-02-14 DIAGNOSIS — I10 Essential (primary) hypertension: Secondary | ICD-10-CM

## 2013-02-14 DIAGNOSIS — E875 Hyperkalemia: Secondary | ICD-10-CM

## 2013-02-14 DIAGNOSIS — J111 Influenza due to unidentified influenza virus with other respiratory manifestations: Principal | ICD-10-CM

## 2013-02-14 LAB — CBC
HCT: 27.7 % — ABNORMAL LOW (ref 36.0–46.0)
Hemoglobin: 9.5 g/dL — ABNORMAL LOW (ref 12.0–15.0)
MCH: 33.1 pg (ref 26.0–34.0)
MCHC: 34.3 g/dL (ref 30.0–36.0)
MCV: 96.5 fL (ref 78.0–100.0)
Platelets: 265 10*3/uL (ref 150–400)
RBC: 2.87 MIL/uL — ABNORMAL LOW (ref 3.87–5.11)
RDW: 16.5 % — ABNORMAL HIGH (ref 11.5–15.5)
WBC: 10.5 10*3/uL (ref 4.0–10.5)

## 2013-02-14 LAB — GLUCOSE, CAPILLARY
Glucose-Capillary: 105 mg/dL — ABNORMAL HIGH (ref 70–99)
Glucose-Capillary: 108 mg/dL — ABNORMAL HIGH (ref 70–99)
Glucose-Capillary: 163 mg/dL — ABNORMAL HIGH (ref 70–99)

## 2013-02-14 LAB — COMPREHENSIVE METABOLIC PANEL
ALT: 190 U/L — ABNORMAL HIGH (ref 0–35)
AST: 127 U/L — ABNORMAL HIGH (ref 0–37)
Albumin: 2.9 g/dL — ABNORMAL LOW (ref 3.5–5.2)
Alkaline Phosphatase: 143 U/L — ABNORMAL HIGH (ref 39–117)
BUN: 39 mg/dL — ABNORMAL HIGH (ref 6–23)
CO2: 19 mEq/L (ref 19–32)
Calcium: 7.8 mg/dL — ABNORMAL LOW (ref 8.4–10.5)
Chloride: 102 mEq/L (ref 96–112)
Creatinine, Ser: 1.27 mg/dL — ABNORMAL HIGH (ref 0.50–1.10)
GFR calc Af Amer: 48 mL/min — ABNORMAL LOW (ref >90)
GFR calc non Af Amer: 42 mL/min — ABNORMAL LOW (ref >90)
Glucose, Bld: 107 mg/dL — ABNORMAL HIGH (ref 70–99)
Potassium: 4.2 mEq/L (ref 3.7–5.3)
Sodium: 136 mEq/L — ABNORMAL LOW (ref 137–147)
Total Bilirubin: 0.2 mg/dL — ABNORMAL LOW (ref 0.3–1.2)
Total Protein: 5.7 g/dL — ABNORMAL LOW (ref 6.0–8.3)

## 2013-02-14 LAB — MITOCHONDRIAL ANTIBODIES: Mitochondrial M2 Ab, IgG: 0.35 (ref <0.91)

## 2013-02-14 MED ORDER — DM-GUAIFENESIN ER 30-600 MG PO TB12: 1.0000 | ORAL_TABLET | Freq: Two times a day (BID) | ORAL | Status: DC

## 2013-02-14 MED ORDER — DM-GUAIFENESIN ER 30-600 MG PO TB12
1.0000 | ORAL_TABLET | Freq: Two times a day (BID) | ORAL | Status: DC
  Administered 2013-02-14: 1 via ORAL
  Filled 2013-02-14 (×3): qty 1

## 2013-02-14 MED ORDER — LACOSAMIDE 200 MG PO TABS
100.0000 mg | ORAL_TABLET | Freq: Two times a day (BID) | ORAL | Status: DC
  Administered 2013-02-14: 100 mg via ORAL
  Filled 2013-02-14: qty 1

## 2013-02-14 MED ORDER — LACOSAMIDE 50 MG PO TABS
100.0000 mg | ORAL_TABLET | Freq: Two times a day (BID) | ORAL | Status: DC
  Administered 2013-02-14: 100 mg via ORAL
  Filled 2013-02-14: qty 2

## 2013-02-14 MED ORDER — OSELTAMIVIR PHOSPHATE 30 MG PO CAPS: 30.0000 mg | ORAL_CAPSULE | Freq: Every day | ORAL | Status: DC

## 2013-02-14 NOTE — Clinical Documentation Improvement (Signed)
Presents with Influenza A with respiratory manifestations, metabolic encephalopathy, severe protein calorie malnutrition. Pneumonia documented, but then antibiotics discontinued shortly after admission with likely viral illness documented in 1/13 progress note. Pneumonia still listed on Problem list.  Please clarify if Pneumonia: ruled in or ruled out.  Thank You, Shellee Milo ,RN Clinical Documentation Specialist:  [phone]  [facility]- Health Information Management Cell: [phone]

## 2013-02-14 NOTE — Progress Notes (Addendum)
   CARE MANAGEMENT NOTE 02/14/2013  Patient:  [name], [name]   Account Number:  [id number]  Date Initiated:  02/12/2013  Documentation initiated by:  Donn Pierini  Subjective/Objective Assessment:   Pt admitted with AMS/Encephalopathy - hyperkalemia, hyponatremia, ARF, HCAP     Action/Plan:   PTA pt was at home with family - had 24/7 care/assistance at home and pt had recently d/c'd from [facility]-   Anticipated DC Date:  02/15/2013   Anticipated DC Plan:  HOME W HOME HEALTH SERVICES      DC Planning Services  CM consult      [facility] Choice  HOME HEALTH   Choice offered to / List presented to:  C-1 Patient   DME arranged  3-N-1      DME agency  [facility]     HH arranged  HH-2 PT     HH-aide HH agency  [facility]   Status of service:  Completed, signed off Medicare Important Message given?   (If response is "NO", the following Medicare IM given date fields will be blank) Date Medicare IM given:   Date Additional Medicare IM given:    Discharge Disposition:  HOME W HOME HEALTH SERVICES  Per UR Regulation:  Reviewed for med. necessity/level of care/duration of stay  If discussed at Long Length of Stay Meetings, dates discussed:    Comments:  02/14/13 14:10 CM spoke with pt for choice.  Pt chooses [facility] for HHPT.  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  Referral for HHPT made to [facility] for HHPT and aide.  Call made to [facility] for DME 3n1 which will be delivered to pt's home. Christoper Allegra accepted referral and per request, this CM faxed referral request (facesheet and DME order) to [phone]. Address and contact number verified with pt.  No other CM needs communicated.  Freddy Jaksch, BSN, CM [phone].

## 2013-02-14 NOTE — Progress Notes (Signed)
Patient's afternoon VIMPAT dose of 100mg  (which was administered at 13:24), was tubed directly to [facility] from the pharmacy in the form of a 200mg  tablet. The remaining 100mg  (half the tablet) was wasted, witnessed by myself and Aquilla Solian, RN.

## 2013-02-14 NOTE — Discharge Summary (Signed)
Physician Discharge Summary  [name] WHQ:[id number] DOB: 06-18-1940 DOA: 02/11/2013  PCP: Default, Provider, MD  Admit date: 02/11/2013 Discharge date: 02/14/2013  Time spent: 45 minutes  Recommendations for Outpatient Follow-up:  -Will be discharged home today with [facility] therapies. -Advised to follow up with PCP in 5 days to have her LFTs rechecked.   Discharge Diagnoses:  Principal Problem:   Influenza A Active Problems:   Hypertension   Encephalopathy   Acute renal failure   Dehydration   Hyperkalemia   Hyponatremia   CAP (community acquired pneumonia)   HCAP (healthcare-associated pneumonia)   Liver enzyme elevation   Pneumobilia   Protein-calorie malnutrition, severe   Discharge Condition: Stable and improved  Filed Weights   02/12/13 0030 02/12/13 2158 02/13/13 2033  Weight: 42.4 kg (93 lb 7.6 oz) 44.77 kg (98 lb 11.2 oz) 45.224 kg (99 lb 11.2 oz)    History of present illness:  [name] is a 73 y.o. female whohas a past medical history of MI (myocardial infarction) and Hypertension, who presented with: Family states for the past 3 days she had dry cough, Chills, and body aches.  denies any sore throat, some nausea and vomiting. Poor PO intake. She started to be confused yesterday but family were not able to talk her into coming in finally today they brought her to ER by ambulance. In ER she was found to be oriented to self only, CXR worrisome for bilateral infiltrates, Lactate of 4.88 and Na 129 and Cr of 2.15,  Her troponin was initially elevated to 0.1 but repeat was wnl. She had some vague body aches in the begining and maybe some chest pain but not currently.  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 She has been on Seizure medications and family states she have not had any repeat events. Hospitalist admission was requested.   Hospital Course:   Influenza A  - Influenza PCR negative but subsequent respiratory virus panel  is positive for Flu A.  -Will resume tamiflu; has 3 days remaining on DC. -Antibiotics for CAP were discontinued, as her symptoms were attributed to her viral illness.   Acute Metabolic Encephalopathy  - Appears to have resolved at this point.   Acute renal failure/ Dehydration  - due to poor PO intake and a few episodes of vomiting as outpt  -Cr continues to improve and is 1.27 on DC.  Transaminitis  - Appreciate GI input.  -MRI without evidence for intra or extrahepatic ductal dilatation. -GI believes elevated LFTs due to cholestasis from meds in the setting of her respiratory illness. -Will need to have her LFTs rechecked in about 5 days at time of hospital follow up.  Hyperkalemia  - resolved-  Hyponatremia  - due to dehydration. -Resolved. -Na 136 on DC.  Hypertension  - cont Coreg   Seizure  - cont Keppra, Dilantin and Vimpat   Procedures:  None   Consultations:  GI, Dr. Michail Sermon  Discharge Instructions  Discharge Orders   Future Orders Complete By Expires   Diet - low sodium heart healthy  As directed    Discontinue IV  As directed    Increase activity slowly  As directed        Medication List         aspirin 325 MG tablet  Take 325 mg by mouth every morning.     carvedilol 3.125 MG tablet  Commonly known as:  COREG  Take 1 tablet (3.125 mg  total) by mouth 2 (two) times daily with a meal.     dextromethorphan-guaiFENesin 30-600 MG per 12 hr tablet  Commonly known as:  MUCINEX DM  Take 1 tablet by mouth 2 (two) times daily.     lacosamide 200 MG Tabs tablet  Commonly known as:  VIMPAT  Take 0.5 tablets (100 mg total) by mouth 2 (two) times daily.     levETIRAcetam 1000 MG tablet  Commonly known as:  KEPPRA  Take 1 tablet (1,000 mg total) by mouth 2 (two) times daily.     oseltamivir 30 MG capsule  Commonly known as:  TAMIFLU  Take 1 capsule (30 mg total) by mouth daily. For 3 more days     phenytoin 50 MG tablet  Commonly known as:   DILANTIN  Chew 2 tablets (100 mg total) by mouth daily.       No Known Allergies     Follow-up Information   Schedule an appointment as soon as possible for a visit in 5 days to follow up. (With you regular provider)        The results of significant diagnostics from this hospitalization (including imaging, microbiology, ancillary and laboratory) are listed below for reference.    Significant Diagnostic Studies: Dg Chest 2 View  02/11/2013   CLINICAL DATA:  Left-sided chest pain  EXAM: CHEST  2 VIEW  COMPARISON:  Prior radiograph from 01/05/2013  FINDINGS: Moderate cardiomegaly is similar as compared to prior exam.  Lungs are normally inflated. Bilateral pleural effusions, left greater than right, are present. There are associated bibasilar patchy and linear opacities, which may reflect atelectasis or possibly infiltrates. No overt pulmonary edema. No pneumothorax.  Osseous structures are unchanged.  IMPRESSION: 1. Persistent small bilateral pleural effusions, left greater than right. 2. Patchy bibasilar opacities, likely atelectasis, however, possible developing infiltrates could also be considered in the correct clinical setting. 3. Stable cardiomegaly without pulmonary edema.   Electronically Signed   By: Jeannine Boga M.D.   On: 02/11/2013 18:47   Ct Head Wo Contrast  02/11/2013   CLINICAL DATA:  Weakness and body aches. Nausea and vomiting. Altered mental status.  EXAM: CT HEAD WITHOUT CONTRAST  TECHNIQUE: Contiguous axial images were obtained from the base of the skull through the vertex without intravenous contrast.  COMPARISON:  01/27/2013.  FINDINGS: No mass lesion, mass effect, midline shift, hydrocephalus, hemorrhage. No acute territorial cortical ischemia/infarct. Atrophy and chronic ischemic white matter disease is present. Old right basal ganglia infarct seen on prior MRI. Posterior fossa structures are within normal limits. The calvarium is intact. Intracranial  atherosclerosis.  IMPRESSION: Atrophy and chronic ischemic white matter disease with old right basal ganglia lacunar infarct. No acute intracranial abnormality.   Electronically Signed   By: Dereck Ligas M.D.   On: 02/11/2013 18:17   US Abdomen Complete  02/12/2013   CLINICAL DATA:  Renal failure and abnormal hepatic function studies.  EXAM: ULTRASOUND ABDOMEN COMPLETE  COMPARISON:  None.  FINDINGS: Gallbladder:  The gallbladder is adequately distended with no evidence of stones, wall thickening, or pericholecystic fluid. There is no positive sonographic Murphy's sign.  Common bile duct:  Diameter: 3.9 mm in diameter which is normal. No abnormal intraluminal echoes are demonstrated.  Liver:  The liver demonstrates normal contour and echogenicity. There are increased echoes however associated with the bowel ducts without significant distal shadowing. While this could reflect some air within the biliary tree fibrosis associated with the bile ducts cannot be excluded. No discrete  hepatic parenchymal mass is demonstrated. There is a to and fro portal venous flow pattern which may reflect increased portal venous pressures due to hepatic parenchymal disease.  IVC:  No abnormality visualized.  Pancreas:  Visualized portion unremarkable.  Spleen:  The spleen exhibits normal echotexture and measures 5.3 cm in greatest dimension.  Right Kidney:  Length: 9.1 cm. The echotexture of the renal cortex on the right is approximately equal to that of the adjacent liver. No hydronephrosis or parenchymal mass is demonstrated.  Left Kidney:  Length: 9.6 cm. The cortical echotexture of the left kidney is subjectively increased. There is no hydronephrosis on the left.  Abdominal aorta:  The abdominal aorta exhibits mild failure to taper of its caliber with maximal diameter 2.6 cm demonstrated below the kidneys.  Other findings:  There are bilateral pleural effusions.  Ascites is present.  IMPRESSION: 1. There are abnormal  intrahepatic echoes associated with the bile ducts which may reflect pneumobilia or could reflect fibrotic change associated with the wall of the bile ducts. In addition there are findings that may reflect increased portal venous pressure due to a to-and-fro type portal venous flow direction on Doppler interrogation. 2. The gallbladder exhibits no evidence of stones. The common bile duct is not abnormally dilated. 3. The echotexture of both kidneys is increased suggesting medical renal disease. No hydronephrosis is demonstrated. 4. There is atherosclerotic failure to taper the caliber of the abdominal aorta with maximal diameter demonstrated distally of 2.6 cm. 5. There is ascites and there are bilateral pleural effusions. 6. Follow-up MRI of the abdomen may be a useful next diagnostic imaging step.   Electronically Signed   By: David  Martinique   On: 02/12/2013 08:21   Mr Abd W Cm/mrcp  02/13/2013   CLINICAL DATA:  Cough with chills body aches and fusions. Elevated LFTs with abnormal liver ultrasound.  EXAM: MRI ABDOMEN WITHOUT AND WITH CONTRAST  TECHNIQUE: Multiplanar multisequence MR imaging of the abdomen was performed both before and after the administration of intravenous contrast.  CONTRAST:  13m MULTIHANCE GADOBENATE DIMEGLUMINE 529 MG/ML IV SOLN  COMPARISON:  Ultrasound from 02/12/2013  FINDINGS: Bilateral pleural effusions with collapse/ consolidation in the lower lobes seen in the lower chest.  Evaluation of the abdomen is markedly limited by the patient's chronic cough and difficulty with reproducible breath holding.  Within the limitation of motion artifact, no focal parenchymal abnormality is evident within the liver. There is no intra or extrahepatic biliary duct dilatation. The main portal vein is patent. There is flow signal in the right and left portal vein. The portal venous structures do opacify the after IV contrast administration. .  There is no discernible mass lesion within the pancreas. The  main pancreatic duct is nondilated.  There is no evidence for splenomegaly. No focal abnormality is identified in the spleen. The stomach, duodenum, adrenal glands, and kidneys are unremarkable.  No gallstones. There is a question of gallbladder wall thickening/edema on today's MRI  No abnormal cyst marrow signal within the visualized skeleton. Small volume perihepatic ascites is evident. There is some fluid around the spleen. High signal intensity assisted with the portal triads in the liver likely related to a degree of periportal edema.  IMPRESSION: Liver assessment markedly limited by the patient's chronic cough and inability to reproducibly breath hold. There is likely periportal edema. No biliary dilatation is evident. There is no MR evidence for occlusive portal venous thrombus.  Question gallbladder wall thickening/edema. No stones are seen within the  lumen of the gallbladder. The gallbladder wall changes may be related to systemic disease given the associated ascites and pleural effusions.  Bilateral lung collapse/consolidation and pleural effusions within the visualized chest.   Electronically Signed   By: Misty Stanley M.D.   On: 02/13/2013 13:36    Microbiology: Recent Results (from the past 240 hour(s))  CULTURE, BLOOD (ROUTINE X 2)     Status: None   Collection Time    02/11/13  8:30 PM      Result Value Range Status   Specimen Description BLOOD HAND LEFT   Final   Special Requests BOTTLES DRAWN AEROBIC ONLY 1CC   Final   Culture  Setup Time     Final   Value: 02/12/2013 00:23     Performed at [facility]   Culture     Final   Value:        BLOOD CULTURE RECEIVED NO GROWTH TO DATE CULTURE WILL BE HELD FOR 5 DAYS BEFORE ISSUING A FINAL NEGATIVE REPORT     Performed at [facility]   Report Status PENDING   Incomplete  CULTURE, BLOOD (ROUTINE X 2)     Status: None   Collection Time    02/11/13  8:35 PM      Result Value Range Status   Specimen Description BLOOD HAND  RIGHT   Final   Special Requests BOTTLES DRAWN AEROBIC ONLY 1CC   Final   Culture  Setup Time     Final   Value: 02/12/2013 00:23     Performed at [facility]   Culture     Final   Value:        BLOOD CULTURE RECEIVED NO GROWTH TO DATE CULTURE WILL BE HELD FOR 5 DAYS BEFORE ISSUING A FINAL NEGATIVE REPORT     Performed at [facility]   Report Status PENDING   Incomplete  RESPIRATORY VIRUS PANEL     Status: Abnormal   Collection Time    02/12/13  5:58 AM      Result Value Range Status   Source - RVPAN NASAL SWAB   Corrected   Comment: CORRECTED ON 01/13 AT 1756: PREVIOUSLY REPORTED AS NASAL SWAB   Respiratory Syncytial Virus A NOT DETECTED   Final   Respiratory Syncytial Virus B NOT DETECTED   Final   Influenza A DETECTED (*)  Final   Influenza B NOT DETECTED   Final   Parainfluenza 1 NOT DETECTED   Final   Parainfluenza 2 NOT DETECTED   Final   Parainfluenza 3 NOT DETECTED   Final   Metapneumovirus NOT DETECTED   Final   Rhinovirus NOT DETECTED   Final   Adenovirus NOT DETECTED   Final   Influenza A H1 NOT DETECTED   Final   Influenza A H3 DETECTED (*)  Final   Comment: (NOTE)           Normal Reference Range for each Analyte: NOT DETECTED     Testing performed using the Luminex xTAG Respiratory Viral Panel test     kit.     This test was developed and its performance characteristics determined     by [facility]. It has not been cleared or approved by the [place]     Food and Drug Administration. This test is used for clinical purposes.     It should not be regarded as investigational or for research. This     laboratory is certified under  the Clinical Laboratory Improvement     Amendments of 1988 (CLIA) as qualified to perform high complexity     clinical laboratory testing.     Performed at [place] PCR SCREENING     Status: None   Collection Time    02/12/13  6:00 AM      Result Value Range Status   MRSA by PCR NEGATIVE  NEGATIVE  Final   Comment:            The GeneXpert MRSA Assay (FDA     approved for NASAL specimens     only), is one component of a     comprehensive MRSA colonization     surveillance program. It is not     intended to diagnose MRSA     infection nor to guide or     monitor treatment for     MRSA infections.     Labs: Basic Metabolic Panel:  Recent Labs Lab 02/12/13 0300 02/12/13 0930 02/12/13 1400 02/13/13 0415 02/14/13 0436  NA 134* 134* 133* 132* 136*  K 5.0 4.1 4.5 3.9 4.2  CL 93* 95* 96 98 102  CO2 18* 17* 17* 16* 19  GLUCOSE 110* 128* 117* 87 107*  BUN 53* 54* 54* 51* 39*  CREATININE 2.03* 2.01* 1.98* 1.78* 1.27*  CALCIUM 8.5 7.8* 7.6* 7.5* 7.8*   Liver Function Tests:  Recent Labs Lab 02/11/13 1640 02/12/13 0300 02/13/13 0800 02/14/13 0436  AST 252* 258* 127* 127*  ALT 229* 257* 176* 190*  ALKPHOS 120* 141* 127* 143*  BILITOT 0.4 0.5 0.2* 0.2*  PROT 7.2 7.4 5.7* 5.7*  ALBUMIN 3.7 3.8 2.8* 2.9*    Recent Labs Lab 02/11/13 1640  LIPASE 38    Recent Labs Lab 02/11/13 1937  AMMONIA 31   CBC:  Recent Labs Lab 02/11/13 1640 02/12/13 0300 02/13/13 0415 02/14/13 0436  WBC 12.7* 12.0* 10.3 10.5  NEUTROABS 10.2* 10.1*  --   --   HGB 10.6* 11.7* 9.9* 9.5*  HCT 30.1* 32.7* 27.6* 27.7*  MCV 93.5 91.9 93.6 96.5  PLT 342 315 255 265   Cardiac Enzymes:  Recent Labs Lab 02/11/13 1937 02/11/13 2130 02/12/13 0240 02/12/13 0930 02/12/13 1430  CKTOTAL  --  120  --   --   --   TROPONINI <0.30  --  <0.30 <0.30 <0.30   BNP: BNP (last 3 results) No results found for this basename: PROBNP,  in the last 8760 hours CBG:  Recent Labs Lab 02/13/13 1248 02/13/13 1736 02/13/13 2147 02/14/13 0808 02/14/13 1144  GLUCAP 109* 195* 185* 105* 108*       Signed:  HERNANDEZ ACOSTA,ESTELA  [facility] Pager: [phone] 02/14/2013, 1:32 PM

## 2013-02-14 NOTE — Progress Notes (Signed)
Patient discharged to home. Patient AVS reviewed with patient and patient's friend/caregiver Samantha and patient's significant other. Also reviewed home health contact information with patient's daughter over the telephone. Patient and caregiver verbalized understanding of medications and follow-up appointments.  Patient remains stable; no signs or symptoms of distress.  Patient educated to return to the ER in cases of SOB, dizziness, fever, chest pain, or fainting.

## 2013-02-15 ENCOUNTER — Ambulatory Visit: Payer: Medicare Other | Admitting: Diagnostic Neuroimaging

## 2013-02-15 ENCOUNTER — Inpatient Hospital Stay: Payer: Medicare Other | Admitting: Physical Medicine & Rehabilitation

## 2013-02-15 NOTE — ED Provider Notes (Signed)
Medical screening examination/treatment/procedure(s) were performed by non-physician practitioner and as supervising physician I was immediately available for consultation/collaboration.  EKG Interpretation    Date/Time:  Monday February 11 2013 15:00:39 EST Ventricular Rate:  84 PR Interval:  226 QRS Duration: 136 QT Interval:  464 QTC Calculation: 548 R Axis:   -133 Text Interpretation:  Sinus rhythm with 1st degree A-V block Biatrial enlargement Left bundle branch block Confirmed by Juleen China  MD, Aivy Akter ([id number]) on 02/11/2013 5:56:37 PM             Raeford Razor, MD 02/15/13 1434

## 2013-02-18 ENCOUNTER — Telehealth: Payer: Self-pay

## 2013-02-18 LAB — CULTURE, BLOOD (ROUTINE X 2)
Culture: NO GROWTH
Culture: NO GROWTH

## 2013-02-18 NOTE — Telephone Encounter (Signed)
Patient's son(Robert) would like someone to call him back. He has questions about the amount of medication being prescribed to the patient for seizures.

## 2013-02-19 ENCOUNTER — Ambulatory Visit
Admission: RE | Admit: 2013-02-19 | Discharge: 2013-02-19 | Disposition: A | Discharge status: Home / Self Care | Payer: Medicare HMO | Source: Ambulatory Visit | Attending: Physical Medicine & Rehabilitation | Admitting: Physical Medicine & Rehabilitation

## 2013-02-19 ENCOUNTER — Encounter: Payer: Medicare HMO | Attending: Physical Medicine & Rehabilitation

## 2013-02-19 ENCOUNTER — Ambulatory Visit (HOSPITAL_BASED_OUTPATIENT_CLINIC_OR_DEPARTMENT_OTHER): Payer: Medicare HMO | Admitting: Physical Medicine & Rehabilitation

## 2013-02-19 ENCOUNTER — Encounter: Payer: Self-pay | Admitting: Physical Medicine & Rehabilitation

## 2013-02-19 ENCOUNTER — Other Ambulatory Visit: Payer: Self-pay | Admitting: Gastroenterology

## 2013-02-19 VITALS — BP 119/82 | HR 94 | Resp 16 | Ht 62.0 in | Wt 103.0 lb

## 2013-02-19 DIAGNOSIS — M545 Low back pain, unspecified: Secondary | ICD-10-CM

## 2013-02-19 DIAGNOSIS — I69993 Ataxia following unspecified cerebrovascular disease: Secondary | ICD-10-CM

## 2013-02-19 DIAGNOSIS — I6350 Cerebral infarction due to unspecified occlusion or stenosis of unspecified cerebral artery: Secondary | ICD-10-CM

## 2013-02-19 DIAGNOSIS — R748 Abnormal levels of other serum enzymes: Secondary | ICD-10-CM

## 2013-02-19 DIAGNOSIS — R279 Unspecified lack of coordination: Secondary | ICD-10-CM | POA: Insufficient documentation

## 2013-02-19 DIAGNOSIS — I252 Old myocardial infarction: Secondary | ICD-10-CM | POA: Insufficient documentation

## 2013-02-19 DIAGNOSIS — F17200 Nicotine dependence, unspecified, uncomplicated: Secondary | ICD-10-CM | POA: Insufficient documentation

## 2013-02-19 DIAGNOSIS — I10 Essential (primary) hypertension: Secondary | ICD-10-CM | POA: Insufficient documentation

## 2013-02-19 DIAGNOSIS — I69998 Other sequelae following unspecified cerebrovascular disease: Secondary | ICD-10-CM | POA: Insufficient documentation

## 2013-02-19 DIAGNOSIS — I639 Cerebral infarction, unspecified: Secondary | ICD-10-CM

## 2013-02-19 DIAGNOSIS — R109 Unspecified abdominal pain: Secondary | ICD-10-CM

## 2013-02-19 HISTORY — DX: Ataxia following unspecified cerebrovascular disease: I69.993

## 2013-02-19 NOTE — Patient Instructions (Signed)
Heating pad 20-30 minutes every 2 hours as needed  Avoid Tylenol and ibuprofen and Aleve  Try icy hot cream

## 2013-02-19 NOTE — Progress Notes (Signed)
Subjective:    Patient ID: [name], female    DOB: May 05, 1940, 73 y.o.   MRN: [id number] [name] is a 73 y.o. right-handed female with history of CVA, hypertension as well as MI who was admitted 12/28/2012 with altered mental status as well as status epilepticus. MRI of the brain showed acute 1 cm right basal ganglia infarction as well as chronic hemorrhage left temporal region. EEG consistent with a potential area for epileptogenicity in the left parietal-occipital region. Patient loaded with Keppra/Dilantin as well as Vimpat CIR Admit date: 01/04/2013  Discharge date: 01/15/2013  HPI Seen by PCP yesterday, no changes in Meds Will see GI for follow up of elevated liver enzymes  No falls since D/C  [facility] 3 day hospitalization discharged on 02/14/2013 Home health PT and OT, at home with family to supervise around the clock   Pain Inventory Average Pain 8 Pain Right Now 10 My pain is sharp, dull and aching  In the last 24 hours, has pain interfered with the following? General activity 7 Relation with others 7 Enjoyment of life 7 What TIME of day is your pain at its worst? night Sleep (in general) Fair  Pain is worse with: walking, bending, sitting, inactivity, standing and some activites Pain improves with: n/a Relief from Meds: n/a  Mobility walk with assistance use a cane ability to climb steps?  yes do you drive?  yes Do you have any goals in this area?  no  Function not employed: date last employed .  Neuro/Psych bladder control problems bowel control problems weakness trouble walking dizziness  Prior Studies Any changes since last visit?  no  Physicians involved in your care Any changes since last visit?  no   Family History  Problem Relation Age of Onset  . Cancer Father   . Anemia Daughter    History   Social History  . Marital Status: Single    Spouse Name: N/A    Number of Children:  N/A  . Years of Education: N/A   Social History Main Topics  . Smoking status: Current Every Day Smoker    Types: Cigarettes  . Smokeless tobacco: None  . Alcohol Use: Yes     Comment: occasional  . Drug Use: No  . Sexual Activity: None   Other Topics Concern  . None   Social History Narrative  . None   History reviewed. No pertinent past surgical history. Past Medical History  Diagnosis Date  . MI (myocardial infarction)   . Hypertension   . Stroke    BP 119/82  Pulse 94  Resp 16  Ht 5\' 2"  (1.575 m)  Wt 103 lb (46.72 kg)  BMI 18.83 kg/m2  SpO2 97%     Review of Systems  Musculoskeletal: Positive for back pain and gait problem.  Neurological: Positive for dizziness and weakness.  All other systems reviewed and are negative.       Objective:   Physical Exam  Nursing note and vitals reviewed. Constitutional: She is oriented to person, place, and time. She appears well-developed and well-nourished.  HENT:  Head: Normocephalic and atraumatic.  Eyes: Conjunctivae and EOM are normal. Pupils are equal, round, and reactive to light.  Neck: Normal range of motion.  Neurological: She is alert and oriented to person, place, and time. She has normal strength. No cranial nerve deficit or sensory deficit. Gait abnormal.  No dysmetria finger nose finger No fine motor deficit noted  on finger to thumb opposition  Psychiatric: She has a normal mood and affect.          Assessment & Plan:  1.  R Basal Ganglia infarct with  Residual balance impairment but no residual gross motor weakness Cont  HHPT.OT  2.  Back pain lumbar subacute, no trauma, no red flags but duration is >2 mo in elderly pt, exam unremarkable, will check Xray, advise heat 20-30 min QID, Counter irritant cream.  No acetaminophen or NSAID secondary to elevated LFTs  RTC 1 mo

## 2013-02-21 NOTE — Telephone Encounter (Signed)
Tried to contact patients son regarding his call, not available.

## 2013-02-24 ENCOUNTER — Encounter (HOSPITAL_COMMUNITY): Payer: Self-pay | Admitting: Emergency Medicine

## 2013-02-24 ENCOUNTER — Emergency Department (HOSPITAL_COMMUNITY): Payer: Medicare HMO

## 2013-02-24 ENCOUNTER — Inpatient Hospital Stay (HOSPITAL_COMMUNITY)
Admission: EM | Admit: 2013-02-24 | Discharge: 2013-02-28 | DRG: 291 | Disposition: A | Discharge status: Home Health | Payer: Medicare HMO | Attending: Internal Medicine | Admitting: Internal Medicine

## 2013-02-24 DIAGNOSIS — I252 Old myocardial infarction: Secondary | ICD-10-CM

## 2013-02-24 DIAGNOSIS — E8770 Fluid overload, unspecified: Secondary | ICD-10-CM | POA: Insufficient documentation

## 2013-02-24 DIAGNOSIS — I5041 Acute combined systolic (congestive) and diastolic (congestive) heart failure: Secondary | ICD-10-CM | POA: Diagnosis present

## 2013-02-24 DIAGNOSIS — Z79899 Other long term (current) drug therapy: Secondary | ICD-10-CM

## 2013-02-24 DIAGNOSIS — R635 Abnormal weight gain: Secondary | ICD-10-CM

## 2013-02-24 DIAGNOSIS — R569 Unspecified convulsions: Secondary | ICD-10-CM

## 2013-02-24 DIAGNOSIS — J189 Pneumonia, unspecified organism: Secondary | ICD-10-CM

## 2013-02-24 DIAGNOSIS — I10 Essential (primary) hypertension: Secondary | ICD-10-CM

## 2013-02-24 DIAGNOSIS — Z681 Body mass index (BMI) 19 or less, adult: Secondary | ICD-10-CM

## 2013-02-24 DIAGNOSIS — G40909 Epilepsy, unspecified, not intractable, without status epilepticus: Secondary | ICD-10-CM

## 2013-02-24 DIAGNOSIS — E43 Unspecified severe protein-calorie malnutrition: Secondary | ICD-10-CM

## 2013-02-24 DIAGNOSIS — R0600 Dyspnea, unspecified: Secondary | ICD-10-CM

## 2013-02-24 DIAGNOSIS — R0781 Pleurodynia: Secondary | ICD-10-CM | POA: Diagnosis present

## 2013-02-24 DIAGNOSIS — Z23 Encounter for immunization: Secondary | ICD-10-CM

## 2013-02-24 DIAGNOSIS — R109 Unspecified abdominal pain: Secondary | ICD-10-CM

## 2013-02-24 DIAGNOSIS — Z8673 Personal history of transient ischemic attack (TIA), and cerebral infarction without residual deficits: Secondary | ICD-10-CM

## 2013-02-24 DIAGNOSIS — R739 Hyperglycemia, unspecified: Secondary | ICD-10-CM | POA: Diagnosis present

## 2013-02-24 DIAGNOSIS — I1310 Hypertensive heart and chronic kidney disease without heart failure, with stage 1 through stage 4 chronic kidney disease, or unspecified chronic kidney disease: Secondary | ICD-10-CM | POA: Diagnosis present

## 2013-02-24 DIAGNOSIS — Z7982 Long term (current) use of aspirin: Secondary | ICD-10-CM

## 2013-02-24 DIAGNOSIS — N189 Chronic kidney disease, unspecified: Secondary | ICD-10-CM | POA: Diagnosis present

## 2013-02-24 DIAGNOSIS — I509 Heart failure, unspecified: Secondary | ICD-10-CM

## 2013-02-24 DIAGNOSIS — N179 Acute kidney failure, unspecified: Secondary | ICD-10-CM

## 2013-02-24 DIAGNOSIS — F17200 Nicotine dependence, unspecified, uncomplicated: Secondary | ICD-10-CM

## 2013-02-24 DIAGNOSIS — I130 Hypertensive heart and chronic kidney disease with heart failure and stage 1 through stage 4 chronic kidney disease, or unspecified chronic kidney disease: Principal | ICD-10-CM | POA: Diagnosis present

## 2013-02-24 DIAGNOSIS — E871 Hypo-osmolality and hyponatremia: Secondary | ICD-10-CM

## 2013-02-24 DIAGNOSIS — K5900 Constipation, unspecified: Secondary | ICD-10-CM

## 2013-02-24 DIAGNOSIS — R748 Abnormal levels of other serum enzymes: Secondary | ICD-10-CM

## 2013-02-24 DIAGNOSIS — R0989 Other specified symptoms and signs involving the circulatory and respiratory systems: Secondary | ICD-10-CM

## 2013-02-24 DIAGNOSIS — E875 Hyperkalemia: Secondary | ICD-10-CM

## 2013-02-24 DIAGNOSIS — R0609 Other forms of dyspnea: Secondary | ICD-10-CM

## 2013-02-24 DIAGNOSIS — R7309 Other abnormal glucose: Secondary | ICD-10-CM

## 2013-02-24 HISTORY — DX: Heart failure, unspecified: I50.9

## 2013-02-24 HISTORY — DX: Pleurodynia: R07.81

## 2013-02-24 HISTORY — DX: Fluid overload, unspecified: E87.70

## 2013-02-24 HISTORY — DX: Dyspnea, unspecified: R06.00

## 2013-02-24 HISTORY — DX: Constipation, unspecified: K59.00

## 2013-02-24 HISTORY — DX: Acute combined systolic (congestive) and diastolic (congestive) heart failure: I50.41

## 2013-02-24 HISTORY — DX: Epilepsy, unspecified, not intractable, without status epilepticus: G40.909

## 2013-02-24 HISTORY — DX: Nicotine dependence, unspecified, uncomplicated: F17.200

## 2013-02-24 HISTORY — DX: Abnormal weight gain: R63.5

## 2013-02-24 HISTORY — DX: Unspecified abdominal pain: R10.9

## 2013-02-24 HISTORY — DX: Hyperglycemia, unspecified: R73.9

## 2013-02-24 LAB — CBC WITH DIFFERENTIAL/PLATELET
Basophils Absolute: 0 10*3/uL (ref 0.0–0.1)
Basophils Relative: 0 % (ref 0–1)
Eosinophils Absolute: 0.1 10*3/uL (ref 0.0–0.7)
Eosinophils Relative: 1 % (ref 0–5)
HCT: 36.7 % (ref 36.0–46.0)
Hemoglobin: 12.6 g/dL (ref 12.0–15.0)
Lymphocytes Relative: 18 % (ref 12–46)
Lymphs Abs: 1.9 10*3/uL (ref 0.7–4.0)
MCH: 34 pg (ref 26.0–34.0)
MCHC: 34.3 g/dL (ref 30.0–36.0)
MCV: 98.9 fL (ref 78.0–100.0)
Monocytes Absolute: 0.8 10*3/uL (ref 0.1–1.0)
Monocytes Relative: 8 % (ref 3–12)
Neutro Abs: 7.6 10*3/uL (ref 1.7–7.7)
Neutrophils Relative %: 73 % (ref 43–77)
Platelets: 467 10*3/uL — ABNORMAL HIGH (ref 150–400)
RBC: 3.71 MIL/uL — ABNORMAL LOW (ref 3.87–5.11)
RDW: 18.8 % — ABNORMAL HIGH (ref 11.5–15.5)
WBC: 10.4 10*3/uL (ref 4.0–10.5)

## 2013-02-24 LAB — COMPREHENSIVE METABOLIC PANEL
ALT: 161 U/L — ABNORMAL HIGH (ref 0–35)
AST: 70 U/L — ABNORMAL HIGH (ref 0–37)
Albumin: 3.3 g/dL — ABNORMAL LOW (ref 3.5–5.2)
Alkaline Phosphatase: 275 U/L — ABNORMAL HIGH (ref 39–117)
BUN: 11 mg/dL (ref 6–23)
CO2: 16 mEq/L — ABNORMAL LOW (ref 19–32)
Calcium: 8.4 mg/dL (ref 8.4–10.5)
Chloride: 95 mEq/L — ABNORMAL LOW (ref 96–112)
Creatinine, Ser: 0.97 mg/dL (ref 0.50–1.10)
GFR calc Af Amer: 67 mL/min — ABNORMAL LOW (ref >90)
GFR calc non Af Amer: 58 mL/min — ABNORMAL LOW (ref >90)
Glucose, Bld: 172 mg/dL — ABNORMAL HIGH (ref 70–99)
Potassium: 4.7 mEq/L (ref 3.7–5.3)
Sodium: 129 mEq/L — ABNORMAL LOW (ref 137–147)
Total Bilirubin: 0.6 mg/dL (ref 0.3–1.2)
Total Protein: 6.9 g/dL (ref 6.0–8.3)

## 2013-02-24 LAB — CBC
HCT: 36.7 % (ref 36.0–46.0)
Hemoglobin: 12.8 g/dL (ref 12.0–15.0)
MCH: 34.6 pg — ABNORMAL HIGH (ref 26.0–34.0)
MCHC: 34.9 g/dL (ref 30.0–36.0)
MCV: 99.2 fL (ref 78.0–100.0)
Platelets: 443 10*3/uL — ABNORMAL HIGH (ref 150–400)
RBC: 3.7 MIL/uL — ABNORMAL LOW (ref 3.87–5.11)
RDW: 18.6 % — ABNORMAL HIGH (ref 11.5–15.5)
WBC: 8.6 10*3/uL (ref 4.0–10.5)

## 2013-02-24 LAB — GLUCOSE, CAPILLARY
Glucose-Capillary: 103 mg/dL — ABNORMAL HIGH (ref 70–99)
Glucose-Capillary: 116 mg/dL — ABNORMAL HIGH (ref 70–99)
Glucose-Capillary: 130 mg/dL — ABNORMAL HIGH (ref 70–99)

## 2013-02-24 LAB — HIV ANTIBODY (ROUTINE TESTING W REFLEX): HIV: NONREACTIVE

## 2013-02-24 LAB — CREATININE, SERUM
Creatinine, Ser: 1.08 mg/dL (ref 0.50–1.10)
GFR calc Af Amer: 59 mL/min — ABNORMAL LOW (ref >90)
GFR calc non Af Amer: 51 mL/min — ABNORMAL LOW (ref >90)

## 2013-02-24 LAB — HEMOGLOBIN A1C
Hgb A1c MFr Bld: 5.9 % — ABNORMAL HIGH (ref <5.7)
Mean Plasma Glucose: 123 mg/dL — ABNORMAL HIGH (ref <117)

## 2013-02-24 LAB — POCT I-STAT TROPONIN I: Troponin i, poc: 0.02 ng/mL (ref 0.00–0.08)

## 2013-02-24 LAB — TROPONIN I
Troponin I: <0.3 ng/mL (ref <0.30)
Troponin I: <0.3 ng/mL (ref <0.30)

## 2013-02-24 LAB — PRO B NATRIURETIC PEPTIDE: Pro B Natriuretic peptide (BNP): 31608 pg/mL — ABNORMAL HIGH (ref 0–125)

## 2013-02-24 LAB — MAGNESIUM: Magnesium: 1.9 mg/dL (ref 1.5–2.5)

## 2013-02-24 LAB — TSH: TSH: 2.529 u[IU]/mL (ref 0.350–4.500)

## 2013-02-24 MED ORDER — FUROSEMIDE 10 MG/ML IJ SOLN
40.0000 mg | Freq: Once | INTRAMUSCULAR | Status: AC
  Administered 2013-02-24: 40 mg via INTRAVENOUS
  Filled 2013-02-24: qty 4

## 2013-02-24 MED ORDER — PHENYTOIN 50 MG PO CHEW
100.0000 mg | CHEWABLE_TABLET | Freq: Every day | ORAL | Status: DC
  Administered 2013-02-24 – 2013-02-28 (×5): 100 mg via ORAL
  Filled 2013-02-24 (×5): qty 2

## 2013-02-24 MED ORDER — DEXTROSE 5 % IV SOLN
1.0000 g | INTRAVENOUS | Status: DC
  Administered 2013-02-24 – 2013-02-25 (×2): 1 g via INTRAVENOUS
  Filled 2013-02-24 (×3): qty 1

## 2013-02-24 MED ORDER — OXYCODONE HCL 5 MG PO TABS
5.0000 mg | ORAL_TABLET | Freq: Four times a day (QID) | ORAL | Status: DC | PRN
  Administered 2013-02-24: 5 mg via ORAL
  Filled 2013-02-24: qty 1

## 2013-02-24 MED ORDER — SODIUM CHLORIDE 0.9 % IJ SOLN
3.0000 mL | Freq: Two times a day (BID) | INTRAMUSCULAR | Status: DC
Start: 2013-02-24 — End: 2013-02-28
  Administered 2013-02-24 (×2): 3 mL via INTRAVENOUS
  Administered 2013-02-25: 10:00:00 via INTRAVENOUS
  Administered 2013-02-26 – 2013-02-27 (×4): 3 mL via INTRAVENOUS

## 2013-02-24 MED ORDER — DEXTROSE 5 % IV SOLN: 1.0000 g | Freq: Three times a day (TID) | INTRAVENOUS | Status: DC

## 2013-02-24 MED ORDER — ONDANSETRON HCL 4 MG/2ML IJ SOLN
4.0000 mg | Freq: Once | INTRAMUSCULAR | Status: AC
  Administered 2013-02-24: 4 mg via INTRAVENOUS
  Filled 2013-02-24: qty 2

## 2013-02-24 MED ORDER — FUROSEMIDE 10 MG/ML IJ SOLN
40.0000 mg | Freq: Two times a day (BID) | INTRAMUSCULAR | Status: DC
  Administered 2013-02-24 (×2): 40 mg via INTRAVENOUS
  Filled 2013-02-24 (×2): qty 4

## 2013-02-24 MED ORDER — HEPARIN SODIUM (PORCINE) 5000 UNIT/ML IJ SOLN: 5000.0000 [IU] | Freq: Three times a day (TID) | INTRAMUSCULAR | Status: DC

## 2013-02-24 MED ORDER — BISACODYL 10 MG RE SUPP: 10.0000 mg | Freq: Every day | RECTAL | Status: DC | PRN

## 2013-02-24 MED ORDER — LACOSAMIDE 50 MG PO TABS
100.0000 mg | ORAL_TABLET | Freq: Two times a day (BID) | ORAL | Status: DC
  Administered 2013-02-24 – 2013-02-28 (×9): 100 mg via ORAL
  Filled 2013-02-24 (×9): qty 2

## 2013-02-24 MED ORDER — SODIUM CHLORIDE 0.9 % IV SOLN: 250.0000 mL | INTRAVENOUS | Status: DC | PRN

## 2013-02-24 MED ORDER — LEVETIRACETAM 500 MG PO TABS
1000.0000 mg | ORAL_TABLET | Freq: Two times a day (BID) | ORAL | Status: DC
  Administered 2013-02-24 – 2013-02-28 (×9): 1000 mg via ORAL
  Filled 2013-02-24 (×10): qty 2

## 2013-02-24 MED ORDER — ASPIRIN 325 MG PO TABS
325.0000 mg | ORAL_TABLET | Freq: Every morning | ORAL | Status: DC
  Administered 2013-02-24 – 2013-02-28 (×5): 325 mg via ORAL
  Filled 2013-02-24 (×5): qty 1

## 2013-02-24 MED ORDER — RAMIPRIL 1.25 MG PO CAPS
1.2500 mg | ORAL_CAPSULE | Freq: Every day | ORAL | Status: DC
  Filled 2013-02-24: qty 1

## 2013-02-24 MED ORDER — SENNOSIDES-DOCUSATE SODIUM 8.6-50 MG PO TABS
1.0000 | ORAL_TABLET | Freq: Two times a day (BID) | ORAL | Status: DC
  Administered 2013-02-24 – 2013-02-28 (×7): 1 via ORAL
  Filled 2013-02-24 (×10): qty 1

## 2013-02-24 MED ORDER — CARVEDILOL 3.125 MG PO TABS
3.1250 mg | ORAL_TABLET | Freq: Two times a day (BID) | ORAL | Status: DC
  Administered 2013-02-24 – 2013-02-28 (×9): 3.125 mg via ORAL
  Filled 2013-02-24 (×11): qty 1

## 2013-02-24 MED ORDER — VANCOMYCIN HCL IN DEXTROSE 750-5 MG/150ML-% IV SOLN
750.0000 mg | INTRAVENOUS | Status: DC
  Administered 2013-02-24 – 2013-02-25 (×2): 750 mg via INTRAVENOUS
  Filled 2013-02-24 (×4): qty 150

## 2013-02-24 MED ORDER — HEPARIN SODIUM (PORCINE) 5000 UNIT/ML IJ SOLN
5000.0000 [IU] | Freq: Three times a day (TID) | INTRAMUSCULAR | Status: DC
  Administered 2013-02-24 – 2013-02-28 (×12): 5000 [IU] via SUBCUTANEOUS
  Filled 2013-02-24 (×15): qty 1

## 2013-02-24 MED ORDER — ONDANSETRON HCL 4 MG/2ML IJ SOLN
4.0000 mg | Freq: Four times a day (QID) | INTRAMUSCULAR | Status: DC | PRN
  Administered 2013-02-28: 4 mg via INTRAVENOUS
  Filled 2013-02-24: qty 2

## 2013-02-24 MED ORDER — POTASSIUM CHLORIDE CRYS ER 20 MEQ PO TBCR
40.0000 meq | EXTENDED_RELEASE_TABLET | Freq: Two times a day (BID) | ORAL | Status: DC
  Administered 2013-02-24 (×2): 40 meq via ORAL
  Filled 2013-02-24 (×4): qty 2

## 2013-02-24 MED ORDER — SODIUM CHLORIDE 0.9 % IJ SOLN: 3.0000 mL | INTRAMUSCULAR | Status: DC | PRN

## 2013-02-24 NOTE — ED Provider Notes (Signed)
CSN: [id number]     Arrival date & time 02/24/13  0430 History   First MD Initiated Contact with Patient 02/24/13 0448     Chief Complaint  Patient presents with  . Constipation  . Nausea   (Consider location/radiation/quality/duration/timing/severity/associated sxs/prior Treatment) The history is provided by the patient and a relative.   73 year-old female is brought to the ED because of constipation and dyspnea. She has been constipated for about the last week and was noted to develop dyspnea tonight. There's been nausea and she has vomited several times. There's been no fever, chills, sweats. His she's not had any abdominal pain he denies any chest pain. She has had a cough over the last week which they have been treating with over-the-counter dextromethorphan. Of note, she had been discharged from the hospital about one day prior to onset of constipation. She had been admitted for influenza.  Past Medical History  Diagnosis Date  . MI (myocardial infarction)   . Hypertension   . Stroke    History reviewed. No pertinent past surgical history. Family History  Problem Relation Age of Onset  . Cancer Father   . Anemia Daughter    History  Substance Use Topics  . Smoking status: Current Every Day Smoker    Types: Cigarettes  . Smokeless tobacco: Not on file  . Alcohol Use: Yes     Comment: occasional   OB History   Grav Para Term Preterm Abortions TAB SAB Ect Mult Living                 Review of Systems  All other systems reviewed and are negative.    Allergies  Review of patient's allergies indicates no known allergies.  Home Medications   Current Outpatient Rx  Name  Route  Sig  Dispense  Refill  . aspirin 325 MG tablet   Oral   Take 325 mg by mouth every morning.          . carvedilol (COREG) 3.125 MG tablet   Oral   Take 1 tablet (3.125 mg total) by mouth 2 (two) times daily with a meal.   60 tablet   1   . dextromethorphan-guaiFENesin (MUCINEX DM)  30-600 MG per 12 hr tablet   Oral   Take 1 tablet by mouth 2 (two) times daily.         Marland Kitchen lacosamide (VIMPAT) 200 MG TABS tablet   Oral   Take 0.5 tablets (100 mg total) by mouth 2 (two) times daily.   30 tablet   1   . levETIRAcetam (KEPPRA) 1000 MG tablet   Oral   Take 1 tablet (1,000 mg total) by mouth 2 (two) times daily.   60 tablet   1   . oseltamivir (TAMIFLU) 30 MG capsule   Oral   Take 1 capsule (30 mg total) by mouth daily. For 3 more days   3 capsule   0   . phenytoin (DILANTIN) 50 MG tablet   Oral   Chew 2 tablets (100 mg total) by mouth daily.   120 tablet   1    BP 108/77  Pulse 98  Temp(Src) 98.6 F (37 C) (Oral)  Resp 20  Ht 5\' 2"  (1.575 m)  Wt 104 lb 8 oz (47.4 kg)  BMI 19.11 kg/m2  SpO2 96% Physical Exam  Nursing note and vitals reviewed.  73 year old female, resting comfortably and in no acute distress. Vital signs are normal. Oxygen saturation is  96%, which is normal. Head is normocephalic and atraumatic. PERRLA, EOMI. Oropharynx is clear. Neck is nontender and supple without adenopathy. JVD is present at 90. Back is nontender and there is no CVA tenderness. 2+ presacral edema is present. Lungs have faint bibasilar rales. Chest is nontender. Heart has regular rate and rhythm without murmur. Abdomen is soft, flat, nontender without masses or hepatosplenomegaly and peristalsis is normoactive. Extremities have no cyanosis or edema, full range of motion is present. Skin is warm and dry without rash. Neurologic: Mental status is normal, cranial nerves are intact, there are no motor or sensory deficits.  ED Course  Procedures (including critical care time) Labs Review Results for orders placed during the hospital encounter of 02/24/13  COMPREHENSIVE METABOLIC PANEL      Result Value Range   Sodium 129 (*) 137 - 147 mEq/L   Potassium 4.7  3.7 - 5.3 mEq/L   Chloride 95 (*) 96 - 112 mEq/L   CO2 16 (*) 19 - 32 mEq/L   Glucose, Bld 172 (*) 70  - 99 mg/dL   BUN 11  6 - 23 mg/dL   Creatinine, Ser 1.610.97  0.50 - 1.10 mg/dL   Calcium 8.4  8.4 - 09.610.5 mg/dL   Total Protein 6.9  6.0 - 8.3 g/dL   Albumin 3.3 (*) 3.5 - 5.2 g/dL   AST 70 (*) 0 - 37 U/L   ALT 161 (*) 0 - 35 U/L   Alkaline Phosphatase 275 (*) 39 - 117 U/L   Total Bilirubin 0.6  0.3 - 1.2 mg/dL   GFR calc non Af Amer 58 (*) >90 mL/min   GFR calc Af Amer 67 (*) >90 mL/min  CBC WITH DIFFERENTIAL      Result Value Range   WBC 10.4  4.0 - 10.5 K/uL   RBC 3.71 (*) 3.87 - 5.11 MIL/uL   Hemoglobin 12.6  12.0 - 15.0 g/dL   HCT 04.536.7  40.936.0 - 81.146.0 %   MCV 98.9  78.0 - 100.0 fL   MCH 34.0  26.0 - 34.0 pg   MCHC 34.3  30.0 - 36.0 g/dL   RDW 91.418.8 (*) 78.211.5 - 95.615.5 %   Platelets 467 (*) 150 - 400 K/uL   Neutrophils Relative % 73  43 - 77 %   Neutro Abs 7.6  1.7 - 7.7 K/uL   Lymphocytes Relative 18  12 - 46 %   Lymphs Abs 1.9  0.7 - 4.0 K/uL   Monocytes Relative 8  3 - 12 %   Monocytes Absolute 0.8  0.1 - 1.0 K/uL   Eosinophils Relative 1  0 - 5 %   Eosinophils Absolute 0.1  0.0 - 0.7 K/uL   Basophils Relative 0  0 - 1 %   Basophils Absolute 0.0  0.0 - 0.1 K/uL  PRO B NATRIURETIC PEPTIDE      Result Value Range   Pro B Natriuretic peptide (BNP) 31608.0 (*) 0 - 125 pg/mL  POCT I-STAT TROPONIN I      Result Value Range   Troponin i, poc 0.02  0.00 - 0.08 ng/mL   Comment 3            Imaging Review Dg Chest 2 View  02/24/2013   CLINICAL DATA:  Constipation.  Nausea and cough.  EXAM: CHEST  2 VIEW  COMPARISON:  Chest radiograph performed 02/11/2013  FINDINGS: The lungs are well-aerated. Bibasilar airspace opacification raises concern for multifocal pneumonia. No definite pleural effusion or pneumothorax  is seen. The appearance is less typical for interstitial edema.  The heart is mildly enlarged. Pulmonary vascularity is at the upper limits of normal. No acute osseous abnormalities are seen.  IMPRESSION: 1. Bibasilar airspace opacification raises concern for multifocal pneumonia.  The appearance is less typical for pulmonary edema. 2. Mild cardiomegaly.   Electronically Signed   By: Roanna Raider M.D.   On: 02/24/2013 06:04   Dg Abd 1 View  02/24/2013   CLINICAL DATA:  Constipation and nausea.  EXAM: ABDOMEN - 1 VIEW  COMPARISON:  Abdominal radiograph performed 01/01/2013  FINDINGS: The visualized bowel gas pattern is unremarkable. Scattered air filled loops of colon are seen; no abnormal dilatation of small bowel loops is seen to suggest small bowel obstruction. No free intra-abdominal air is identified, though evaluation for free air is limited on a single supine view.  The visualized osseous structures are within normal limits; the sacroiliac joints are unremarkable in appearance.  IMPRESSION: Unremarkable bowel gas pattern; no free intra-abdominal air seen. No radiographic evidence for significant constipation.   Electronically Signed   By: Roanna Raider M.D.   On: 02/24/2013 06:05   Images viewed by me.  EKG Interpretation    Date/Time:  Sunday February 24 2013 04:41:30 EST Ventricular Rate:  106 PR Interval:  167 QRS Duration: 127 QT Interval:  385 QTC Calculation: 511 R Axis:   -125 Text Interpretation:  Sinus tachycardia Left bundle branch block When compared with ECG of 02/12/2013, QT has shortened Confirmed by Preston Fleeting  MD, Ivory Bail ([id number]) on 02/24/2013 4:50:18 AM            MDM   1. CHF (congestive heart failure)   2. Constipation   3. Hyponatremia   4. Elevated liver enzymes    Constipation. Dyspnea which is worrisome for CHF given her peripheral edema and neck vein distention. Chest x-ray will be obtained as well as abdominal x-ray and screening labs. Old records are reviewed and she had been admitted for pneumonia, influenza, and acute renal failure.  BNP has come back significantly elevated consistent with CHF. She's given a dose of furosemide and will need to be admitted since she does not have a history of CHF. I reviewed the chest x-ray and  although radiologist is concern for multifocal pneumonia, the rest of her clinical picture is not that of pneumonia. There is no fever, no cough, and there are other physical findings are strongly suggestive of CHF. Case is discussed with Dr. Julian Reil of [facility] who agrees to admit the patient.  Dione Booze, MD 02/25/13 [phone]

## 2013-02-24 NOTE — H&P (Signed)
History and Physical Examination  [name]:[id number] DOB: [birth year] DOA: 02/24/2013  Referring physician: Preston Fleeting PCP: Areatha Keas, MD   Chief Complaint: shortness of breath   HPI: [name] is a 73 y.o. female smoker with a past medical history significant for congestive heart failure (EF 35% December 20, 2012) reportedly rolled into the emergency department via wheelchair with family members with severe shortness of breath, moaning grunting and unable to speak in complete sentences because of shortness of breath.  The patient reports that she's been coughing profusely over the past several days.  She was recently discharged approximately one week ago with an influenza A. infection that was treated with Tamiflu.  The patient reports that when she arrived home she continued to cough and shortness of breath got progressively worse.  She reports that she began experiencing deep pain in the chest with coughing and inspiration.  She also reports swelling in the feet and abdomen.  A couple of days ago she began to experience mild abdominal pain and constipation.  The patient reports that she's having very hard stools.  The patient also reports that she has had some chills.  The patient has been taking over-the-counter cough suppressant medication with no improvement.  In the emergency department the patient was evaluated with a chest x-ray that was suspicious for pneumonia.  The patient also had a marked elevation of BNP.  She had gained 10 pounds in the last 10 days.  She was given a dose of Lasix and hospitalization was requested for further evaluation and management.  The patient has a history as epilepsy but that has remained stable.  The patient is also a smoker.  Past Medical History Past Medical History  Diagnosis Date  . MI (myocardial infarction)   . Hypertension   . Stroke   . Congestive heart failure (CHF)     EF 35% Nov 2014  . Epilepsy   . Smoker     Past Surgical  History History reviewed. No pertinent past surgical history.  Home Meds: Prior to Admission medications   Medication Sig Start Date End Date Taking? Authorizing Provider  aspirin 325 MG tablet Take 325 mg by mouth every morning.    Yes Historical Provider, MD  carvedilol (COREG) 3.125 MG tablet Take 1 tablet (3.125 mg total) by mouth 2 (two) times daily with a meal. 01/15/13  Yes Evlyn Kanner Love, PA-C  dextromethorphan-guaiFENesin (MUCINEX DM) 30-600 MG per 12 hr tablet Take 1 tablet by mouth 2 (two) times daily. 02/14/13  Yes Estela Isaiah Blakes, MD  lacosamide (VIMPAT) 200 MG TABS tablet Take 0.5 tablets (100 mg total) by mouth 2 (two) times daily. 01/15/13  Yes Evlyn Kanner Love, PA-C  levETIRAcetam (KEPPRA) 1000 MG tablet Take 1 tablet (1,000 mg total) by mouth 2 (two) times daily. 01/15/13  Yes Evlyn Kanner Love, PA-C  phenytoin (DILANTIN) 50 MG tablet Chew 2 tablets (100 mg total) by mouth daily. 01/15/13  Yes Evlyn Kanner Love, PA-C  oseltamivir (TAMIFLU) 30 MG capsule Take 1 capsule (30 mg total) by mouth daily. For 3 more days 02/14/13   Henderson Cloud, MD   Allergies: Review of patient's allergies indicates no known allergies.  Social History:  History   Social History  . Marital Status: Single    Spouse Name: N/A    Number of Children: N/A  . Years of Education: N/A   Occupational History  . Not on file.   Social History Main Topics  .  Smoking status: Current Every Day Smoker    Types: Cigarettes  . Smokeless tobacco: Not on file  . Alcohol Use: Yes     Comment: occasional  . Drug Use: No  . Sexual Activity: Not on file   Other Topics Concern  . Not on file   Social History Narrative  . No narrative on file   Family History:  Family History  Problem Relation Age of Onset  . Cancer Father   . Anemia Daughter    Review of Systems:  Pertinent positives include: chills, fatigue, abdominal pain,constipation, shortness of breath at rest, non-productive cough,  weight gain Constitutional: No weight loss, night sweats, Fevers,  HEENT: No headaches, Difficulty swallowing,Tooth/dental problems,Sore throat,  No sneezing, itching, ear ache, nasal congestion, post nasal drip,  Cardio-vascular: No chest pain, Orthopnea, PND, anasarca, dizziness, palpitations. Positive Bilateral lower extremity swelling  GI: No heartburn, indigestion,diarrhea, positive change in bowel habits with constipation, loss of appetite, melena, blood in stool, hematemesis  Resp: significant dyspnea, excess mucus, no productive cough, No No coughing up of blood.No change in color of mucus.No wheezing.  Skin: no rash or lesions. No jaundice  GU: no dysuria, change in color of urine, no urgency or frequency. No straining to urinate. No flank pain.  Musculoskeletal: No joint pain or no joint swelling. No decreased range of motion. No back pain.  Psych: No change in mood or affect. No depression or anxiety. No memory loss.  Neuro: no localizing neurological complaints, no tingling, no weakness, no double vision, no gait abnormality, no slurred speech, no  Otherwise ROS are negative except for above, 10 systems were reviewed  Physical Exam: Blood pressure 117/91, pulse 112, resp. rate 23, SpO2 98.00%. 73 year old female, resting and in no acute distress.  Head is normocephalic and atraumatic. PERRLA, EOMI. Oropharynx is clear.  Neck is nontender and supple without adenopathy. Mild JVD  Lungs: crackles heard bibasilar, upper airway congestion noise, rare rales heard.  Chest is tender in sternal area with palpation.  Heart has regular rate and rhythm without murmur.  Abdomen is soft, flat, mild epigastric tenderness without masses or hepatosplenomegaly, no guarding, no rebound TTP Extremities have no cyanosis or edema, full range of motion is present.  Back is nontender and there is no CVA tenderness.   Skin is warm and dry without rash.  Neurologic: Mental status is normal, cranial  nerves are intact, there are no motor or sensory deficits.  Lab  And Imaging results:  Results for orders placed during the hospital encounter of 02/24/13 (from the past 24 hour(s))  COMPREHENSIVE METABOLIC PANEL     Status: Abnormal   Collection Time    02/24/13  4:59 AM      Result Value Range   Sodium 129 (*) 137 - 147 mEq/L   Potassium 4.7  3.7 - 5.3 mEq/L   Chloride 95 (*) 96 - 112 mEq/L   CO2 16 (*) 19 - 32 mEq/L   Glucose, Bld 172 (*) 70 - 99 mg/dL   BUN 11  6 - 23 mg/dL   Creatinine, Ser 7.82  0.50 - 1.10 mg/dL   Calcium 8.4  8.4 - 95.6 mg/dL   Total Protein 6.9  6.0 - 8.3 g/dL   Albumin 3.3 (*) 3.5 - 5.2 g/dL   AST 70 (*) 0 - 37 U/L   ALT 161 (*) 0 - 35 U/L   Alkaline Phosphatase 275 (*) 39 - 117 U/L   Total Bilirubin 0.6  0.3 - 1.2 mg/dL   GFR calc non Af Amer 58 (*) >90 mL/min   GFR calc Af Amer 67 (*) >90 mL/min  CBC WITH DIFFERENTIAL     Status: Abnormal   Collection Time    02/24/13  4:59 AM      Result Value Range   WBC 10.4  4.0 - 10.5 K/uL   RBC 3.71 (*) 3.87 - 5.11 MIL/uL   Hemoglobin 12.6  12.0 - 15.0 g/dL   HCT 16.136.7  09.636.0 - 04.546.0 %   MCV 98.9  78.0 - 100.0 fL   MCH 34.0  26.0 - 34.0 pg   MCHC 34.3  30.0 - 36.0 g/dL   RDW 40.918.8 (*) 81.111.5 - 91.415.5 %   Platelets 467 (*) 150 - 400 K/uL   Neutrophils Relative % 73  43 - 77 %   Neutro Abs 7.6  1.7 - 7.7 K/uL   Lymphocytes Relative 18  12 - 46 %   Lymphs Abs 1.9  0.7 - 4.0 K/uL   Monocytes Relative 8  3 - 12 %   Monocytes Absolute 0.8  0.1 - 1.0 K/uL   Eosinophils Relative 1  0 - 5 %   Eosinophils Absolute 0.1  0.0 - 0.7 K/uL   Basophils Relative 0  0 - 1 %   Basophils Absolute 0.0  0.0 - 0.1 K/uL  PRO B NATRIURETIC PEPTIDE     Status: Abnormal   Collection Time    02/24/13  4:59 AM      Result Value Range   Pro B Natriuretic peptide (BNP) 31608.0 (*) 0 - 125 pg/mL  POCT I-STAT TROPONIN I     Status: None   Collection Time    02/24/13  5:18 AM      Result Value Range   Troponin i, poc 0.02  0.00 - 0.08  ng/mL   Comment 3            Impression / Plan     Acute exacerbation of CHF (congestive heart failure) - the patient appears volume overloaded with a 10 pound weight gain since 02/11/13,  significant shortness of breath, with an elevated BNP consistent with CHF.  Will continue IV diuresis, monitor intake output, monitor daily weights, low sodium diet, monitor electrolytes closely, admit to telemetry for monitoring.  Hypertension - blood pressures well controlled continue carvedilol daily    Hyponatremia - this likely related to volume overload, will monitor closely with daily BMP checks  HCAP (healthcare-associated pneumonia) - because patient was recently hospitalized and now having significant clinical symptoms and chest x-ray findings suggesting pneumonia we'll start IV antibiotics to cover nosocomial pathogens  Liver enzyme elevation - liver enzymes have improved since her recent discharge.  She was seen by GI during the last hospitalization and they thought that her LFT elevation was transient related to the acute infection and illness.  We'll continue to monitor LFTs.  Protein-calorie malnutrition, severe - the patient has had a 10 pound weight gain over the last 2 weeks, however I think this is more related to volume overload related to her congestive heart failure.  Will consult dietitian.    Epilepsy- has been stable and controlled on current seizure medications, will continue    Smoker - will provide nicotine patch and tobacco cessation counseling    Volume overload - management as above    Hyperglycemia - check hemoglobin A1c, patient has no known history of diabetes mellitus and this likely is stress hyperglycemia  Dyspnea- provide supplemental oxygen as needed and nebulizer treatments, patient likely has underlying COPD given her smoking history    Constipation - abdominal x-ray negative for severe constipation, will provide laxatives as needed    Abdominal pain- likely  related to constipation, mild symptoms, will follow with serial exams, x-ray as above    Pleuritic chest pain - patient has had significant coughing spells over the past 2 weeks, will provide medication for pain and discomfort as needed  Family Communication: I spoke with her son by telephone and updated him on patient's condition CODE STATUS: Full Disposition: Telemetry  Standley Dakins MD [facility] [place], [place] [phone] 02/24/2013, 8:18 AM

## 2013-02-24 NOTE — ED Notes (Signed)
Pt rolled into ED registration via w/c by family, pt sob, moaning/grunting, unable to speak in complete sentences, c/o sob, answering questions in short 2 word answers, taken straight to room A6, also c/o unable to have a BM, assisted in transfer to stretcher from w/c, generally weak.

## 2013-02-24 NOTE — ED Notes (Signed)
Attempted report 

## 2013-02-24 NOTE — ED Notes (Signed)
Nauseated since yesterday.  She just got over the flu. She states, "constipated since yesterday."

## 2013-02-24 NOTE — Progress Notes (Signed)
ANTIBIOTIC CONSULT NOTE - INITIAL  Pharmacy Consult for Vancomycin Indication: rule out pneumonia  No Known Allergies  Patient Measurements: Height: _0  (157.5 cm) Weight: 103 lb 9.9 oz (47 kg) IBW/kg (Calculated) : 50.1  Vital Signs: Temp: 98 F (36.7 C) (01/25 0905) Temp src: Oral (01/25 0905) BP: 138/98 mmHg (01/25 0905) Pulse Rate: 107 (01/25 0905) Intake/Output from previous day:   Intake/Output from this shift:    Labs:  Recent Labs  02/24/13 0459  WBC 10.4  HGB 12.6  PLT 467*  CREATININE 0.97   Estimated Creatinine Clearance: 40 ml/min (by C-G formula based on Cr of 0.97). No results found for this basename: VANCOTROUGH, Corlis Leak, VANCORANDOM, [place], GENTPEAK, GENTRANDOM, TOBRATROUGH, TOBRAPEAK, TOBRARND, AMIKACINPEAK, AMIKACINTROU, AMIKACIN,  in the last 72 hours   Microbiology: Recent Results (from the past 720 hour(s))  CULTURE, BLOOD (ROUTINE X 2)     Status: None   Collection Time    02/11/13  8:30 PM      Result Value Range Status   Specimen Description BLOOD HAND LEFT   Final   Special Requests BOTTLES DRAWN AEROBIC ONLY [place]   Final   Culture  Setup Time     Final   Value: 02/12/2013 00:23     Performed at [facility]   Culture     Final   Value: NO GROWTH 5 DAYS     Performed at [facility]   Report Status 02/18/2013 FINAL   Final  CULTURE, BLOOD (ROUTINE X 2)     Status: None   Collection Time    02/11/13  8:35 PM      Result Value Range Status   Specimen Description BLOOD HAND RIGHT   Final   Special Requests BOTTLES DRAWN AEROBIC ONLY 1CC   Final   Culture  Setup Time     Final   Value: 02/12/2013 00:23     Performed at [facility]   Culture     Final   Value: NO GROWTH 5 DAYS     Performed at [facility]   Report Status 02/18/2013 FINAL   Final  RESPIRATORY VIRUS PANEL     Status: Abnormal   Collection Time    02/12/13  5:58 AM      Result Value Range Status   Source - RVPAN NASAL SWAB    Corrected   Comment: CORRECTED ON 01/13 AT 1756: PREVIOUSLY REPORTED AS NASAL SWAB   Respiratory Syncytial Virus A NOT DETECTED   Final   Respiratory Syncytial Virus B NOT DETECTED   Final   Influenza A DETECTED (*)  Final   Influenza B NOT DETECTED   Final   Parainfluenza 1 NOT DETECTED   Final   Parainfluenza 2 NOT DETECTED   Final   Parainfluenza 3 NOT DETECTED   Final   Metapneumovirus NOT DETECTED   Final   Rhinovirus NOT DETECTED   Final   Adenovirus NOT DETECTED   Final   Influenza A H1 NOT DETECTED   Final   Influenza A H3 DETECTED (*)  Final   Comment: (NOTE)           Normal Reference Range for each Analyte: NOT DETECTED     Testing performed using the Luminex xTAG Respiratory Viral Panel test     kit.     This test was developed and its performance characteristics determined     by [facility]. It has not been cleared or approved by  the [place]     Food and Drug Administration. This test is used for clinical purposes.     It should not be regarded as investigational or for research. This     laboratory is certified under the [place] (CLIA) as qualified to perform high complexity     clinical laboratory testing.     Performed at [facility] PCR SCREENING     Status: None   Collection Time    02/12/13  6:00 AM      Result Value Range Status   MRSA by PCR NEGATIVE  NEGATIVE Final   Comment:            The GeneXpert MRSA Assay (FDA     approved for NASAL specimens     only), is one component of a     comprehensive MRSA colonization     surveillance program. It is not     intended to diagnose MRSA     infection nor to guide or     monitor treatment for     MRSA infections.    Medical History: Past Medical History  Diagnosis Date  . MI (myocardial infarction)   . Hypertension   . Stroke   . Congestive heart failure (CHF)     EF 35% Nov 2014  . Epilepsy   . Smoker     Medications:   Prescriptions prior to admission  Medication Sig Dispense Refill  . aspirin 325 MG tablet Take 325 mg by mouth every morning.       . carvedilol (COREG) 3.125 MG tablet Take 1 tablet (3.125 mg total) by mouth 2 (two) times daily with a meal.  60 tablet  1  . dextromethorphan-guaiFENesin (MUCINEX DM) 30-600 MG per 12 hr tablet Take 1 tablet by mouth 2 (two) times daily.      Marland Kitchen lacosamide (VIMPAT) 200 MG TABS tablet Take 0.5 tablets (100 mg total) by mouth 2 (two) times daily.  30 tablet  1  . levETIRAcetam (KEPPRA) 1000 MG tablet Take 1 tablet (1,000 mg total) by mouth 2 (two) times daily.  60 tablet  1  . phenytoin (DILANTIN) 50 MG tablet Chew 2 tablets (100 mg total) by mouth daily.  120 tablet  1  . oseltamivir (TAMIFLU) 30 MG capsule Take 1 capsule (30 mg total) by mouth daily. For 3 more days  3 capsule  0    Assessment: 73 YO F recently discharged after treatment for flu admitted 02/24/2013 with SOB, CXR suspicious for PNA but patient has HF and has gained 10lb in last 10 days.  Pharmacy consulted to dose vancomycin  PMH: CAD, HF (EF 35%), HTN, Stroke, Epilepsy, smoker  Goal of Therapy:  Vancomycin trough level 15-20 mcg/ml  Plan:  1. Vancomycin 750 mg IV q24h 2. Change cefepime to 1 g IV q24h 3. Follow up SCr, UOP, cultures, clinical course and adjust as clinically indicated.     Thank you for allowing pharmacy to be a part of this patients care team.  Rowe Robert Pharm.D., BCPS Clinical Pharmacist 02/24/2013 10:03 AM Pager: [phone] Phone: [phone]

## 2013-02-25 ENCOUNTER — Inpatient Hospital Stay (HOSPITAL_COMMUNITY): Payer: Medicare HMO

## 2013-02-25 DIAGNOSIS — J189 Pneumonia, unspecified organism: Secondary | ICD-10-CM

## 2013-02-25 DIAGNOSIS — N179 Acute kidney failure, unspecified: Secondary | ICD-10-CM

## 2013-02-25 DIAGNOSIS — R748 Abnormal levels of other serum enzymes: Secondary | ICD-10-CM

## 2013-02-25 DIAGNOSIS — I509 Heart failure, unspecified: Secondary | ICD-10-CM

## 2013-02-25 LAB — COMPREHENSIVE METABOLIC PANEL
ALT: 134 U/L — ABNORMAL HIGH (ref 0–35)
AST: 62 U/L — ABNORMAL HIGH (ref 0–37)
Albumin: 2.8 g/dL — ABNORMAL LOW (ref 3.5–5.2)
Alkaline Phosphatase: 258 U/L — ABNORMAL HIGH (ref 39–117)
BUN: 15 mg/dL (ref 6–23)
CO2: 19 mEq/L (ref 19–32)
Calcium: 8.3 mg/dL — ABNORMAL LOW (ref 8.4–10.5)
Chloride: 98 mEq/L (ref 96–112)
Creatinine, Ser: 1.48 mg/dL — ABNORMAL HIGH (ref 0.50–1.10)
GFR calc Af Amer: 40 mL/min — ABNORMAL LOW (ref >90)
GFR calc non Af Amer: 35 mL/min — ABNORMAL LOW (ref >90)
Glucose, Bld: 102 mg/dL — ABNORMAL HIGH (ref 70–99)
Potassium: 6.9 mEq/L (ref 3.7–5.3)
Sodium: 131 mEq/L — ABNORMAL LOW (ref 137–147)
Total Bilirubin: 0.5 mg/dL (ref 0.3–1.2)
Total Protein: 6.1 g/dL (ref 6.0–8.3)

## 2013-02-25 LAB — CK: Total CK: 42 U/L (ref 7–177)

## 2013-02-25 LAB — CBC WITH DIFFERENTIAL/PLATELET
Basophils Absolute: 0 10*3/uL (ref 0.0–0.1)
Basophils Relative: 0 % (ref 0–1)
Eosinophils Absolute: 0.1 10*3/uL (ref 0.0–0.7)
Eosinophils Relative: 1 % (ref 0–5)
HCT: 32.3 % — ABNORMAL LOW (ref 36.0–46.0)
Hemoglobin: 11.2 g/dL — ABNORMAL LOW (ref 12.0–15.0)
Lymphocytes Relative: 18 % (ref 12–46)
Lymphs Abs: 1.5 10*3/uL (ref 0.7–4.0)
MCH: 34.1 pg — ABNORMAL HIGH (ref 26.0–34.0)
MCHC: 34.7 g/dL (ref 30.0–36.0)
MCV: 98.5 fL (ref 78.0–100.0)
Monocytes Absolute: 0.7 10*3/uL (ref 0.1–1.0)
Monocytes Relative: 8 % (ref 3–12)
Neutro Abs: 5.9 10*3/uL (ref 1.7–7.7)
Neutrophils Relative %: 73 % (ref 43–77)
Platelets: 409 10*3/uL — ABNORMAL HIGH (ref 150–400)
RBC: 3.28 MIL/uL — ABNORMAL LOW (ref 3.87–5.11)
RDW: 18.5 % — ABNORMAL HIGH (ref 11.5–15.5)
WBC: 8.2 10*3/uL (ref 4.0–10.5)

## 2013-02-25 LAB — PRO B NATRIURETIC PEPTIDE: Pro B Natriuretic peptide (BNP): 30027 pg/mL — ABNORMAL HIGH (ref 0–125)

## 2013-02-25 LAB — BASIC METABOLIC PANEL
BUN: 16 mg/dL (ref 6–23)
CO2: 12 mEq/L — ABNORMAL LOW (ref 19–32)
Calcium: 8.7 mg/dL (ref 8.4–10.5)
Chloride: 95 mEq/L — ABNORMAL LOW (ref 96–112)
Creatinine, Ser: 1.33 mg/dL — ABNORMAL HIGH (ref 0.50–1.10)
GFR calc Af Amer: 46 mL/min — ABNORMAL LOW (ref >90)
GFR calc non Af Amer: 39 mL/min — ABNORMAL LOW (ref >90)
Glucose, Bld: 77 mg/dL (ref 70–99)
Potassium: 6.1 mEq/L — ABNORMAL HIGH (ref 3.7–5.3)
Sodium: 127 mEq/L — ABNORMAL LOW (ref 137–147)

## 2013-02-25 LAB — GLUCOSE, CAPILLARY
Glucose-Capillary: 115 mg/dL — ABNORMAL HIGH (ref 70–99)
Glucose-Capillary: 115 mg/dL — ABNORMAL HIGH (ref 70–99)
Glucose-Capillary: 103 mg/dL — ABNORMAL HIGH (ref 70–99)
Glucose-Capillary: 117 mg/dL — ABNORMAL HIGH (ref 70–99)

## 2013-02-25 LAB — MAGNESIUM: Magnesium: 1.8 mg/dL (ref 1.5–2.5)

## 2013-02-25 LAB — OSMOLALITY: Osmolality: 424 mOsm/kg — ABNORMAL HIGH (ref 275–300)

## 2013-02-25 LAB — CLOSTRIDIUM DIFFICILE BY PCR: Toxigenic C. Difficile by PCR: NEGATIVE

## 2013-02-25 LAB — TROPONIN I: Troponin I: <0.3 ng/mL (ref <0.30)

## 2013-02-25 LAB — LACTIC ACID, PLASMA: Lactic Acid, Venous: 2.7 mmol/L — ABNORMAL HIGH (ref 0.5–2.2)

## 2013-02-25 MED ORDER — SODIUM POLYSTYRENE SULFONATE 15 GM/60ML PO SUSP
15.0000 g | Freq: Once | ORAL | Status: AC
  Administered 2013-02-25: 15 g via ORAL
  Filled 2013-02-25: qty 60

## 2013-02-25 MED ORDER — SODIUM CHLORIDE 0.9 % IV SOLN
1.0000 g | Freq: Once | INTRAVENOUS | Status: AC
  Administered 2013-02-25: 1 g via INTRAVENOUS
  Filled 2013-02-25 (×3): qty 10

## 2013-02-25 MED ORDER — METOLAZONE 2.5 MG PO TABS
2.5000 mg | ORAL_TABLET | Freq: Once | ORAL | Status: AC
  Administered 2013-02-25: 2.5 mg via ORAL
  Filled 2013-02-25: qty 1

## 2013-02-25 MED ORDER — FUROSEMIDE 10 MG/ML IJ SOLN
4.0000 mg/h | INTRAMUSCULAR | Status: DC
  Administered 2013-02-25: 8 mg/h via INTRAVENOUS
  Administered 2013-02-26 – 2013-02-27 (×2): 4 mg/h via INTRAVENOUS
  Filled 2013-02-25 (×3): qty 25

## 2013-02-25 MED ORDER — SODIUM CHLORIDE 0.9 % IJ SOLN
10.0000 mL | INTRAMUSCULAR | Status: DC | PRN
  Administered 2013-02-27: 10 mL

## 2013-02-25 MED ORDER — SODIUM BICARBONATE 650 MG PO TABS
1300.0000 mg | ORAL_TABLET | Freq: Four times a day (QID) | ORAL | Status: DC
  Administered 2013-02-25: 1300 mg via ORAL
  Filled 2013-02-25 (×6): qty 2

## 2013-02-25 MED ORDER — DEXTROSE 50 % IV SOLN
1.0000 | Freq: Once | INTRAVENOUS | Status: AC
  Administered 2013-02-25: 50 mL via INTRAVENOUS
  Filled 2013-02-25: qty 50

## 2013-02-25 MED ORDER — INSULIN ASPART 100 UNIT/ML ~~LOC~~ SOLN
10.0000 [IU] | Freq: Once | SUBCUTANEOUS | Status: AC
  Administered 2013-02-25: 10 [IU] via SUBCUTANEOUS

## 2013-02-25 MED ORDER — ENSURE PUDDING PO PUDG
1.0000 | Freq: Three times a day (TID) | ORAL | Status: DC
  Administered 2013-02-25 – 2013-02-27 (×3): 1 via ORAL

## 2013-02-25 MED ORDER — FUROSEMIDE 10 MG/ML IJ SOLN
60.0000 mg | Freq: Once | INTRAMUSCULAR | Status: AC
  Administered 2013-02-25: 60 mg via INTRAVENOUS

## 2013-02-25 NOTE — Progress Notes (Signed)
[facility] PROGRESS NOTE Interim History: 73 y.o. female smoker with a past medical history significant for congestive heart failure (EF 35% December 20, 2012) reportedly rolled into the emergency department via wheelchair with family members with severe shortness of breath, moaning grunting and unable to speak in complete sentences because of shortness of breath. The patient reports that she's been coughing profusely over the past several days. She was recently discharged approximately one week ago with an influenza A. infection that was treated with Tamiflu. The patient reports that when she arrived home she continued to cough and shortness of breath got progressively worse. She reports that she began experiencing deep pain in the chest with coughing and inspiration, BNP > 20k, AKI and PNA.  Filed Weights   02/24/13 0905 02/25/13 0531  Weight: 47 kg (103 lb 9.9 oz) 47.4 kg (104 lb 8 oz)        Intake/Output Summary (Last 24 hours) at 02/25/13 0741 Last data filed at 02/25/13 0542  Gross per 24 hour  Intake    243 ml  Output    150 ml  Net     93 ml     Assessment/Plan: Acute combined systolic and diastolic heart failure: - On IV lasix ggt and low dose metolazone, previous weight 93-96 lb in 2014.On admission 104 Kg. - Sat > 96% on RA. Massive JVD. Trace lower extremity edema.  - Daily weight, fluid restriction. - b-me tin afternoon. - cont to hold coreg due to severe heart failure.  Hyperkalemia: - EKG showed peaked T wave QRS > 120. - Give IV calcium gluconate, insulin, D5 and lasix IV. - Repeat EKG. Give kayexalate.  HCAP (healthcare-associated pneumonia): - On IV vanc and cefepime started on 1.25.2014. - Afebrile.  AKI: - due to systolic heart failure. ? Cardiorenal syndrome. - cont IV lasix. - b-met in am, baseline Cr 1.0-1.2.  Transaminates: - ? Passive congestion due to heart failure. -recheck on 1.28.2014.  Constipation - resolved with  kayexalate.  Epilepsy - cont Keppra no seizure activity.  Protein-calorie malnutrition, severe - ensure TID.   Hyponatremia - due to heart failure. - cont diuresis. b-met in after noon.  Hypertension: - BP boderline in am, now improved.    Code Status: full Family Communication: none  Disposition Plan: inpatient   Consultants:  none  Procedures: ECHO: 11.29.2014: estimated ejection fraction was in the range of 35% to 40%. Diffuse hypokinesis.   Antibiotics:  Vanc and cefepime 1.26.2014  HPI/Subjective: She relates she feels much better.  Objective: Filed Vitals:   02/24/13 2038 02/24/13 2100 02/24/13 2210 02/25/13 0531  BP: 93/63 106/74 106/74 108/77  Pulse: 102 95  98  Temp: 97.5 F (36.4 C)   98.6 F (37 C)  TempSrc: Oral   Oral  Resp: 20   20  Height:      Weight:    47.4 kg (104 lb 8 oz)  SpO2: 91%   96%     Exam:  General: Alert, awake, oriented x3, in no acute distress.  HEENT: No bruits, no goiter.  Heart: Regular rate and rhythm, without murmurs, rubs, gallops.  Lungs: Good air movement, bilateral air movement.  Abdomen: Soft, nontender, nondistended, positive bowel sounds.  Neuro: Grossly intact, nonfocal.   Data Reviewed: Basic Metabolic Panel:  Recent Labs Lab 02/24/13 0459 02/24/13 1050 02/25/13 0427  NA 129*  --  131*  K 4.7  --  6.9*  CL 95*  --  98  CO2 16*  --  19  GLUCOSE 172*  --  102*  BUN 11  --  15  CREATININE 0.97 1.08 1.48*  CALCIUM 8.4  --  8.3*  MG  --  1.9 1.8   Liver Function Tests:  Recent Labs Lab 02/24/13 0459 02/25/13 0427  AST 70* 62*  ALT 161* 134*  ALKPHOS 275* 258*  BILITOT 0.6 0.5  PROT 6.9 6.1  ALBUMIN 3.3* 2.8*   No results found for this basename: LIPASE, AMYLASE,  in the last 168 hours No results found for this basename: AMMONIA,  in the last 168 hours CBC:  Recent Labs Lab 02/24/13 0459 02/24/13 1050 02/25/13 0427  WBC 10.4 8.6 8.2  NEUTROABS 7.6  --  5.9  HGB 12.6 12.8  11.2*  HCT 36.7 36.7 32.3*  MCV 98.9 99.2 98.5  PLT 467* 443* 409*   Cardiac Enzymes:  Recent Labs Lab 02/24/13 1050 02/24/13 1708 02/24/13 2355  TROPONINI <0.30 <0.30 <0.30   BNP (last 3 results)  Recent Labs  02/24/13 0459 02/25/13 0427  PROBNP 31608.0* 30027.0*   CBG:  Recent Labs Lab 02/24/13 1120 02/24/13 1651 02/24/13 2124 02/25/13 0703  GLUCAP 103* 116* 130* 115*    Recent Results (from the past 240 hour(s))  CULTURE, BLOOD (ROUTINE X 2)     Status: None   Collection Time    02/24/13 10:50 AM      Result Value Range Status   Specimen Description BLOOD LEFT ARM   Final   Special Requests BOTTLES DRAWN AEROBIC ONLY 10CC   Final   Culture  Setup Time     Final   Value: 02/24/2013 16:43     Performed at [facility]   Culture     Final   Value:        BLOOD CULTURE RECEIVED NO GROWTH TO DATE CULTURE WILL BE HELD FOR 5 DAYS BEFORE ISSUING A FINAL NEGATIVE REPORT     Performed at [facility]   Report Status PENDING   Incomplete  CULTURE, BLOOD (ROUTINE X 2)     Status: None   Collection Time    02/24/13 11:15 AM      Result Value Range Status   Specimen Description BLOOD RIGHT ARM   Final   Special Requests BOTTLES DRAWN AEROBIC ONLY 2CC   Final   Culture  Setup Time     Final   Value: 02/24/2013 16:43     Performed at [facility]   Culture     Final   Value:        BLOOD CULTURE RECEIVED NO GROWTH TO DATE CULTURE WILL BE HELD FOR 5 DAYS BEFORE ISSUING A FINAL NEGATIVE REPORT     Performed at [facility]   Report Status PENDING   Incomplete     Studies: Dg Chest 2 View  02/24/2013   CLINICAL DATA:  Constipation.  Nausea and cough.  EXAM: CHEST  2 VIEW  COMPARISON:  Chest radiograph performed 02/11/2013  FINDINGS: The lungs are well-aerated. Bibasilar airspace opacification raises concern for multifocal pneumonia. No definite pleural effusion or pneumothorax is seen. The appearance is less typical for interstitial  edema.  The heart is mildly enlarged. Pulmonary vascularity is at the upper limits of normal. No acute osseous abnormalities are seen.  IMPRESSION: 1. Bibasilar airspace opacification raises concern for multifocal pneumonia. The appearance is less typical for pulmonary edema. 2. Mild cardiomegaly.   Electronically Signed   By: Garald Balding M.D.   On:  02/24/2013 06:04   Dg Abd 1 View  02/24/2013   CLINICAL DATA:  Constipation and nausea.  EXAM: ABDOMEN - 1 VIEW  COMPARISON:  Abdominal radiograph performed 01/01/2013  FINDINGS: The visualized bowel gas pattern is unremarkable. Scattered air filled loops of colon are seen; no abnormal dilatation of small bowel loops is seen to suggest small bowel obstruction. No free intra-abdominal air is identified, though evaluation for free air is limited on a single supine view.  The visualized osseous structures are within normal limits; the sacroiliac joints are unremarkable in appearance.  IMPRESSION: Unremarkable bowel gas pattern; no free intra-abdominal air seen. No radiographic evidence for significant constipation.   Electronically Signed   By: Garald Balding M.D.   On: 02/24/2013 06:05    Scheduled Meds: . aspirin  325 mg Oral q morning - 10a  . carvedilol  3.125 mg Oral BID WC  . ceFEPime (MAXIPIME) IV  1 g Intravenous Q24H  . furosemide  40 mg Intravenous Q12H  . heparin  5,000 Units Subcutaneous Q8H  . lacosamide  100 mg Oral BID  . levETIRAcetam  1,000 mg Oral BID  . phenytoin  100 mg Oral Daily  . potassium chloride  40 mEq Oral BID  . ramipril  1.25 mg Oral Daily  . senna-docusate  1 tablet Oral BID  . sodium chloride  3 mL Intravenous Q12H  . vancomycin  750 mg Intravenous Q24H   Continuous Infusions:    Charlynne Cousins  [facility] Pager [phone] If 8PM-8AM, please contact night-coverage at [email or web address], password [facility] 02/25/2013, 7:41 AM  LOS: 1 day

## 2013-02-25 NOTE — Progress Notes (Signed)
Peripherally Inserted Central Catheter/Midline Placement  The IV Nurse has discussed with the patient and/or persons authorized to consent for the patient, the purpose of this procedure and the potential benefits and risks involved with this procedure.  The benefits include less needle sticks, lab draws from the catheter and patient may be discharged home with the catheter.  Risks include, but not limited to, infection, bleeding, blood clot (thrombus formation), and puncture of an artery; nerve damage and irregular heat beat.  Alternatives to this procedure were also discussed.  PICC/Midline Placement Documentation  PICC / Midline Double Lumen 02/25/13 PICC Right Basilic 35 cm 1 cm (Active)  Indication for Insertion or Continuance of Line Poor Vasculature-patient has had multiple peripheral attempts or PIVs lasting less than 24 hours 02/25/2013 12:00 PM  Exposed Catheter (cm) 1 cm 02/25/2013 12:00 PM  Dressing Change Due 03/04/13 02/25/2013 12:00 PM       Stacie Glaze Horton 02/25/2013, 12:56 PM

## 2013-02-25 NOTE — Progress Notes (Signed)
5F urinary catheter placed successfully on first attempt. Patient tolerated it well.

## 2013-02-25 NOTE — Progress Notes (Signed)
PA notified about serum osmolality of 424, no new orders given at this time, will continue to monitor

## 2013-02-25 NOTE — Clinical Documentation Improvement (Signed)
PLEASE CLARIFY TYPE & ACUITY OF CHF  Possible Clinical Conditions?  Chronic Systolic Congestive Heart Failure Chronic Diastolic Congestive Heart Failure Chronic Systolic & Diastolic Congestive Heart Failure Acute Systolic Congestive Heart Failure Acute Diastolic Congestive Heart Failure Acute Systolic & Diastolic Congestive Heart Failure Acute on Chronic Systolic Congestive Heart Failure Acute on Chronic Diastolic Congestive Heart Failure Acute on Chronic Systolic & Diastolic Congestive Heart Failure Other Condition Cannot Clinically Determine  Supporting Information:(as per notes) " Pt has CHF"  Thank You, Elonda Husky, CCDS  Clinical Documentation Specialist:  [phone]   Cell=[phone] [place]- [facility] Information Management

## 2013-02-25 NOTE — Progress Notes (Signed)
Report given to receiving RN. Patient sleeping in bed. Patient is stable with no signs or symptoms of distress. 

## 2013-02-26 DIAGNOSIS — N179 Acute kidney failure, unspecified: Secondary | ICD-10-CM

## 2013-02-26 DIAGNOSIS — I5041 Acute combined systolic (congestive) and diastolic (congestive) heart failure: Secondary | ICD-10-CM

## 2013-02-26 LAB — BASIC METABOLIC PANEL
BUN: 16 mg/dL (ref 6–23)
CO2: 24 mEq/L (ref 19–32)
Calcium: 8.3 mg/dL — ABNORMAL LOW (ref 8.4–10.5)
Chloride: 95 mEq/L — ABNORMAL LOW (ref 96–112)
Creatinine, Ser: 1.44 mg/dL — ABNORMAL HIGH (ref 0.50–1.10)
GFR calc Af Amer: 42 mL/min — ABNORMAL LOW (ref >90)
GFR calc non Af Amer: 36 mL/min — ABNORMAL LOW (ref >90)
Glucose, Bld: 101 mg/dL — ABNORMAL HIGH (ref 70–99)
Potassium: 3.6 mEq/L — ABNORMAL LOW (ref 3.7–5.3)
Sodium: 134 mEq/L — ABNORMAL LOW (ref 137–147)

## 2013-02-26 LAB — URINALYSIS, ROUTINE W REFLEX MICROSCOPIC
Bilirubin Urine: NEGATIVE
Glucose, UA: NEGATIVE mg/dL
Ketones, ur: NEGATIVE mg/dL
Nitrite: NEGATIVE
Protein, ur: NEGATIVE mg/dL
Specific Gravity, Urine: 1.009 (ref 1.005–1.030)
Urobilinogen, UA: 0.2 mg/dL (ref 0.0–1.0)
pH: 5 (ref 5.0–8.0)

## 2013-02-26 LAB — URINE MICROSCOPIC-ADD ON

## 2013-02-26 LAB — NA AND K (SODIUM & POTASSIUM), RAND UR
Potassium Urine: 8 mEq/L
Sodium, Ur: 114 mEq/L

## 2013-02-26 LAB — OSMOLALITY, URINE: Osmolality, Ur: 256 mOsm/kg — ABNORMAL LOW (ref 390–1090)

## 2013-02-26 LAB — STREP PNEUMONIAE URINARY ANTIGEN: Strep Pneumo Urinary Antigen: NEGATIVE

## 2013-02-26 MED ORDER — INFLUENZA VAC SPLIT QUAD 0.5 ML IM SUSP
0.5000 mL | INTRAMUSCULAR | Status: DC
  Filled 2013-02-26 (×2): qty 0.5

## 2013-02-26 MED ORDER — LEVOFLOXACIN 750 MG PO TABS: 750.0000 mg | ORAL_TABLET | Freq: Every day | ORAL | Status: DC

## 2013-02-26 MED ORDER — LEVOFLOXACIN 500 MG PO TABS
500.0000 mg | ORAL_TABLET | ORAL | Status: DC
  Administered 2013-02-28: 500 mg via ORAL
  Filled 2013-02-26: qty 1

## 2013-02-26 MED ORDER — LEVOFLOXACIN 750 MG PO TABS
750.0000 mg | ORAL_TABLET | Freq: Once | ORAL | Status: AC
  Administered 2013-02-26: 750 mg via ORAL
  Filled 2013-02-26: qty 1

## 2013-02-26 MED ORDER — PNEUMOCOCCAL VAC POLYVALENT 25 MCG/0.5ML IJ INJ
0.5000 mL | INJECTION | INTRAMUSCULAR | Status: DC
  Filled 2013-02-26 (×2): qty 0.5

## 2013-02-26 NOTE — Progress Notes (Addendum)
[facility] PROGRESS NOTE Interim History: 72 y.o. female smoker with a past medical history significant for congestive heart failure (EF 35% December 20, 2012) reportedly rolled into the emergency department via wheelchair with family members with severe shortness of breath, moaning grunting and unable to speak in complete sentences because of shortness of breath. The patient reports that she's been coughing profusely over the past several days. She was recently discharged approximately one week ago with an influenza A. infection that was treated with Tamiflu. The patient reports that when she arrived home she continued to cough and shortness of breath got progressively worse. She reports that she began experiencing deep pain in the chest with coughing and inspiration, BNP > 20k, AKI and PNA.  Filed Weights   02/24/13 0905 02/25/13 0531 02/26/13 0613  Weight: 47 kg (103 lb 9.9 oz) 47.4 kg (104 lb 8 oz) 47.7 kg (105 lb 2.6 oz)        Intake/Output Summary (Last 24 hours) at 02/26/13 0933 Last data filed at 02/26/13 0700  Gross per 24 hour  Intake 931.67 ml  Output   3551 ml  Net -2619.33 ml     Assessment/Plan: Acute combined systolic and diastolic heart failure: - Cont IV lasix ggt single dose of metolazone 1.26.2014, previous weight 93-96 lb in 2014.On admission 104 Kg. - Sat > 96% on RA. +JVD. Trace lower extremity edema.  - Daily weight, fluid restriction. - b-met daily - resume low dose coreg in am.  Hyperkalemia: - resolved due AKI - EKG showed peaked T wave QRS > 120. Was- Give IV calcium gluconate, insulin, D5 and lasix IV and Give kayexalate, with improvement on her K.  HCAP (healthcare-associated pneumonia): - On IV vanc and cefepime started on 1.25.2014. De-escalate to oral antibiotics on 1.27.2015. - Afebrile.  AKI: - due to systolic heart failure. ? Cardiorenal syndrome. - cont IV lasix ggt. - baseline Cr 1.0-1.2.  Transaminates: - ? Passive congestion due  to heart failure. - recheck on 1.28.2014.  Constipation - resolved with kayexalate.  Epilepsy - cont Keppra no seizure activity.  Protein-calorie malnutrition, severe - ensure TID.   Hyponatremia - due to heart failure. - cont diuresis. b-met in after noon.  Hypertension: - BP boderline in am, now improved.    Code Status: full Family Communication: none  Disposition Plan: inpatient   Consultants:  none  Procedures: ECHO: 11.29.2014: estimated ejection fraction was in the range of 35% to 40%. Diffuse hypokinesis.   Antibiotics:  Vanc and cefepime 1.26.2014  HPI/Subjective: Still SOB but much improve compare to admission.  Objective: Filed Vitals:   02/25/13 1443 02/25/13 1715 02/25/13 2121 02/26/13 0613  BP: 143/89 129/83 115/73 121/80  Pulse: 95 115 100 98  Temp: 98.6 F (37 C)  98 F (36.7 C) 98.1 F (36.7 C)  TempSrc: Oral  Oral Oral  Resp: 20  20 20  Height:      Weight:    47.7 kg (105 lb 2.6 oz)  SpO2: 100%  96% 98%     Exam:  General: Alert, awake, oriented x3, in no acute distress.  HEENT: No bruits, no goiter.  Heart: Regular rate and rhythm, without murmurs, rubs, gallops.  Lungs: Good air movement, bilateral air movement.  Abdomen: Soft, nontender, nondistended, positive bowel sounds.  Neuro: Grossly intact, nonfocal.   Data Reviewed: Basic Metabolic Panel:  Recent Labs Lab 02/24/13 0459 02/24/13 1050 02/25/13 0427 02/25/13 1515 02/26/13 0440  NA 129*  --  131* 127* 134*    K 4.7  --  6.9* 6.1* 3.6*  CL 95*  --  98 95* 95*  CO2 16*  --  19 12* 24  GLUCOSE 172*  --  102* 77 101*  BUN 11  --  _0 CREATININE 0.97 1.08 1.48* 1.33* 1.44*  CALCIUM 8.4  --  8.3* 8.7 8.3*  MG  --  1.9 1.8  --   --    Liver Function Tests:  Recent Labs Lab 02/24/13 0459 02/25/13 0427  AST 70* 62*  ALT 161* 134*  ALKPHOS 275* 258*  BILITOT 0.6 0.5  PROT 6.9 6.1  ALBUMIN 3.3* 2.8*   No results found for this basename: LIPASE,  AMYLASE,  in the last 168 hours No results found for this basename: AMMONIA,  in the last 168 hours CBC:  Recent Labs Lab 02/24/13 0459 02/24/13 1050 02/25/13 0427  WBC 10.4 8.6 8.2  NEUTROABS 7.6  --  5.9  HGB 12.6 12.8 11.2*  HCT 36.7 36.7 32.3*  MCV 98.9 99.2 98.5  PLT 467* 443* 409*   Cardiac Enzymes:  Recent Labs Lab 02/24/13 1050 02/24/13 1708 02/24/13 2355 02/25/13 2018  CKTOTAL  --   --   --  42  TROPONINI <0.30 <0.30 <0.30  --    BNP (last 3 results)  Recent Labs  02/24/13 0459 02/25/13 0427  PROBNP 31608.0* 30027.0*   CBG:  Recent Labs Lab 02/24/13 2124 02/25/13 0703 02/25/13 1120 02/25/13 1614 02/25/13 2126  GLUCAP 130* 115* 115* 103* 117*    Recent Results (from the past 240 hour(s))  CULTURE, BLOOD (ROUTINE X 2)     Status: None   Collection Time    02/24/13 10:50 AM      Result Value Range Status   Specimen Description BLOOD LEFT ARM   Final   Special Requests BOTTLES DRAWN AEROBIC ONLY 10CC   Final   Culture  Setup Time     Final   Value: 02/24/2013 16:43     Performed at [facility]   Culture     Final   Value:        BLOOD CULTURE RECEIVED NO GROWTH TO DATE CULTURE WILL BE HELD FOR 5 DAYS BEFORE ISSUING A FINAL NEGATIVE REPORT     Performed at [facility]   Report Status PENDING   Incomplete  CULTURE, BLOOD (ROUTINE X 2)     Status: None   Collection Time    02/24/13 11:15 AM      Result Value Range Status   Specimen Description BLOOD RIGHT ARM   Final   Special Requests BOTTLES DRAWN AEROBIC ONLY 2CC   Final   Culture  Setup Time     Final   Value: 02/24/2013 16:43     Performed at [facility]   Culture     Final   Value:        BLOOD CULTURE RECEIVED NO GROWTH TO DATE CULTURE WILL BE HELD FOR 5 DAYS BEFORE ISSUING A FINAL NEGATIVE REPORT     Performed at [facility]   Report Status PENDING   Incomplete  CLOSTRIDIUM DIFFICILE BY PCR     Status: None   Collection Time    02/25/13  3:41 PM       Result Value Range Status   C difficile by pcr NEGATIVE  NEGATIVE Final     Studies: Dg Chest 2 View  02/25/2013   CLINICAL DATA:  CHF and pneumonia, history hypertension,  MI, smoking  EXAM: CHEST  2 VIEW  COMPARISON:  02/24/2013  FINDINGS: Enlargement of cardiac silhouette.  Prominent right superior mediastinal soft tissues unchanged.  Atherosclerotic calcification aorta.  Bibasilar effusions much larger on right, increased on right since previous exam.  Increased opacification of the right lung base likely a combination of atelectasis and consolidation.  Improved aeration in left lower lobe.  Upper lungs clear.  No pneumothorax.  Bones demineralized.  IMPRESSION: Increased right pleural effusion and basilar atelectasis.  Probable persisting consolidation in right lower lobe.  Small left pleural effusion with improved aeration in left lower lobe.   Electronically Signed   By: Mark  Boles M.D.   On: 02/25/2013 08:40    Scheduled Meds: . aspirin  325 mg Oral q morning - 10a  . carvedilol  3.125 mg Oral BID WC  . feeding supplement (ENSURE)  1 Container Oral TID BM  . heparin  5,000 Units Subcutaneous Q8H  . [START ON 02/27/2013] influenza vac split quadrivalent PF  0.5 mL Intramuscular Tomorrow-1000  . lacosamide  100 mg Oral BID  . levETIRAcetam  1,000 mg Oral BID  . levofloxacin  750 mg Oral Daily  . phenytoin  100 mg Oral Daily  . [START ON 02/27/2013] pneumococcal 23 valent vaccine  0.5 mL Intramuscular Tomorrow-1000  . senna-docusate  1 tablet Oral BID  . sodium chloride  3 mL Intravenous Q12H   Continuous Infusions: . furosemide (LASIX) infusion 2 mg/hr (02/26/13 0748)     FELIZ ORTIZ, ABRAHAM  [facility] Pager [phone] If 8PM-8AM, please contact night-coverage at [email or web address], password [email or web address] 02/26/2013, 9:33 AM  LOS: 2 days        

## 2013-02-26 NOTE — Progress Notes (Signed)
ANTIBIOTIC CONSULT NOTE  Pharmacy Consult for levofloxacin Indication: rule out pneumonia  No Known Allergies  Patient Measurements: Height: _0  (157.5 cm) Weight: 105 lb 2.6 oz (47.7 kg) IBW/kg (Calculated) : 50.1  Vital Signs: Temp: 98.1 F (36.7 C) (01/27 0349) Temp src: Oral (01/27 0613) BP: 121/80 mmHg (01/27 1791) Pulse Rate: 98 (01/27 0613) Intake/Output from previous day: 01/26 0701 - 01/27 0700 In: 1051.7 [P.O.:600; I.V.:51.7; IV Piggyback:400] Out: 5056 [Urine:3950; Stool:1] Intake/Output from this shift:    Labs:  Recent Labs  02/24/13 0459 02/24/13 1050 02/25/13 0427 02/25/13 1515 02/26/13 0440  WBC 10.4 8.6 8.2  --   --   HGB 12.6 12.8 11.2*  --   --   PLT 467* 443* 409*  --   --   CREATININE 0.97 1.08 1.48* 1.33* 1.44*   Estimated Creatinine Clearance: 27.4 ml/min (by C-G formula based on Cr of 1.44).   Microbiology: Recent Results (from the past 720 hour(s))  CULTURE, BLOOD (ROUTINE X 2)     Status: None   Collection Time    02/11/13  8:30 PM      Result Value Range Status   Specimen Description BLOOD HAND LEFT   Final   Special Requests BOTTLES DRAWN AEROBIC ONLY 1CC   Final   Culture  Setup Time     Final   Value: 02/12/2013 00:23     Performed at [facility]   Culture     Final   Value: NO GROWTH 5 DAYS     Performed at [facility]   Report Status 02/18/2013 FINAL   Final  CULTURE, BLOOD (ROUTINE X 2)     Status: None   Collection Time    02/11/13  8:35 PM      Result Value Range Status   Specimen Description BLOOD HAND RIGHT   Final   Special Requests BOTTLES DRAWN AEROBIC ONLY 1CC   Final   Culture  Setup Time     Final   Value: 02/12/2013 00:23     Performed at [facility]   Culture     Final   Value: NO GROWTH 5 DAYS     Performed at [facility]   Report Status 02/18/2013 FINAL   Final  RESPIRATORY VIRUS PANEL     Status: Abnormal   Collection Time    02/12/13  5:58 AM      Result  Value Range Status   Source - RVPAN NASAL SWAB   Corrected   Comment: CORRECTED ON 01/13 AT 1756: PREVIOUSLY REPORTED AS NASAL SWAB   Respiratory Syncytial Virus A NOT DETECTED   Final   Respiratory Syncytial Virus B NOT DETECTED   Final   Influenza A DETECTED (*)  Final   Influenza B NOT DETECTED   Final   Parainfluenza 1 NOT DETECTED   Final   Parainfluenza 2 NOT DETECTED   Final   Parainfluenza 3 NOT DETECTED   Final   Metapneumovirus NOT DETECTED   Final   Rhinovirus NOT DETECTED   Final   Adenovirus NOT DETECTED   Final   Influenza A H1 NOT DETECTED   Final   Influenza A H3 DETECTED (*)  Final   Comment: (NOTE)           Normal Reference Range for each Analyte: NOT DETECTED     Testing performed using the Luminex xTAG Respiratory Viral Panel test     kit.     This test  was developed and its performance characteristics determined     by [facility]. It has not been cleared or approved by the [place]     Food and Drug Administration. This test is used for clinical purposes.     It should not be regarded as investigational or for research. This     laboratory is certified under the [place] (CLIA) as qualified to perform high complexity     clinical laboratory testing.     Performed at [facility] SCREENING     Status: None   Collection Time    02/12/13  6:00 AM      Result Value Range Status   MRSA by PCR NEGATIVE  NEGATIVE Final   Comment:            The GeneXpert MRSA Assay (FDA     approved for NASAL specimens     only), is one component of a     comprehensive MRSA colonization     surveillance program. It is not     intended to diagnose MRSA     infection nor to guide or     monitor treatment for     MRSA infections.  CULTURE, BLOOD (ROUTINE X 2)     Status: None   Collection Time    02/24/13 10:50 AM      Result Value Range Status   Specimen Description BLOOD LEFT ARM   Final   Special Requests  BOTTLES DRAWN AEROBIC ONLY 10CC   Final   Culture  Setup Time     Final   Value: 02/24/2013 16:43     Performed at [facility]   Culture     Final   Value:        BLOOD CULTURE RECEIVED NO GROWTH TO DATE CULTURE WILL BE HELD FOR 5 DAYS BEFORE ISSUING A FINAL NEGATIVE REPORT     Performed at [facility]   Report Status PENDING   Incomplete  CULTURE, BLOOD (ROUTINE X 2)     Status: None   Collection Time    02/24/13 11:15 AM      Result Value Range Status   Specimen Description BLOOD RIGHT ARM   Final   Special Requests BOTTLES DRAWN AEROBIC ONLY 2CC   Final   Culture  Setup Time     Final   Value: 02/24/2013 16:43     Performed at [facility]   Culture     Final   Value:        BLOOD CULTURE RECEIVED NO GROWTH TO DATE CULTURE WILL BE HELD FOR 5 DAYS BEFORE ISSUING A FINAL NEGATIVE REPORT     Performed at [facility]   Report Status PENDING   Incomplete  CLOSTRIDIUM DIFFICILE BY PCR     Status: None   Collection Time    02/25/13  3:41 PM      Result Value Range Status   C difficile by pcr NEGATIVE  NEGATIVE Final    Assessment: 73 YO F recently discharged after treatment for flu admitted 02/24/2013 with SOB, CXR suspicious for PNA but patient has HF and has gained 10lb in last 10 days.  Pharmacy was consulted to dose vancomycin and also for adjustment of antibiotics based on renal function. Vancomycin and cefepime d/c'd today and levofloxacin ordered. SCr 1.29m/dL, est CrCl ~287mmin. WBC 8.2, afebrile  Goal of Therapy:  Eradication of  infection  Plan:  1. Changed levofloxacin to 774m PO x1, then 5070mPO q48h based on renal function 2. Pharmacy to sign off as no other renal adjustments required. Please re-consult if needed.  Imogene Gravelle D. Commodore Bellew, PharmD, BCPS Clinical Pharmacist Pager: [phone] 6955/27/2015 9:44 AM

## 2013-02-26 NOTE — Progress Notes (Signed)
Report given to receiving RN. Patient is in bed, watching TV. Patient is stable with no signs or symptoms of distress and no verbal complaints. 

## 2013-02-26 NOTE — Progress Notes (Signed)
Patient evaluated for community based chronic disease management services with [facility] Care Management Program as a benefit of patient's  [employer]. Spoke with patient at bedside to explain [facility] Care Management services. She asked me to contact her HCPOA Shakiela Minkoff of [place].  Called son on his mobile at [phone].  Son gave verbal consent for service.  Patient will be discharging to the son's home in [place] for approximately 30 days post discharge.  His address is [address] [place], [place] [address].  Son verified that her PCP is [name].  Updated EPIC demographics with confirmed PCP.  Son requested a call back from attending MD for status update. Notified staff nurse of family's request.  Patient has a new diagnosis of CHF.  She does not have a scale and does not have an acute CHF symptom management plan.  Patient will receive a post discharge transition of care call and will be evaluated for monthly home visits for assessments and disease process education.  Son is the primary transition of care contact.  Left contact information and [facility] literature at bedside. Made Inpatient Case Manager aware that [facility] Care Management following. Of note, [facility] Care Management services does not replace or interfere with any services that are arranged by inpatient case management or social work.  For additional questions or referrals please contact Anibal Henderson BSN RN [facility] Liaison at [phone].

## 2013-02-26 NOTE — Progress Notes (Signed)
BP 97/74. MD made aware No signs or symptoms of distress or discomfort. No verbal complaints. Ok to give coreg. Will continue to monitor patient for further changes in condition.

## 2013-02-26 NOTE — Progress Notes (Signed)
The patient had one bout of confusion last night and jumped out of bed.  The bed alarm was on and the patient was reoriented.  She did not have any complaints of pain or otherwise.

## 2013-02-26 NOTE — Progress Notes (Signed)
I spoke with Ms. Triner about her Heart Failure diagnosis.  She lives alone with some help from a "friend".  She denies any difficulties getting to and from appointments. She listened as I reviewed the signs and symptoms of heart failure, importance of daily weights, fluid restriction and a low sodium diet.  I will definitely plan to see her again as I sensed today she did not want to spend a lot of time discussing her heart failure.  She will need more education as her current plan is to discharge home alone with help from her friend.  Driscilla Moats RN, BSN, PCCN--Heart Failure Navigator

## 2013-02-27 ENCOUNTER — Other Ambulatory Visit: Payer: Medicare HMO

## 2013-02-27 DIAGNOSIS — E871 Hypo-osmolality and hyponatremia: Secondary | ICD-10-CM

## 2013-02-27 DIAGNOSIS — E875 Hyperkalemia: Secondary | ICD-10-CM

## 2013-02-27 DIAGNOSIS — E43 Unspecified severe protein-calorie malnutrition: Secondary | ICD-10-CM

## 2013-02-27 LAB — LEGIONELLA ANTIGEN, URINE: Legionella Antigen, Urine: NEGATIVE

## 2013-02-27 LAB — PHENYTOIN LEVEL, FREE AND TOTAL
Phenytoin, Free: <0.5 mg/L (ref 1.0–2.0)
Phenytoin, Total: <2.5 mg/L (ref 10.0–20.0)

## 2013-02-27 LAB — HEPATIC FUNCTION PANEL
ALT: 79 U/L — ABNORMAL HIGH (ref 0–35)
AST: 35 U/L (ref 0–37)
Albumin: 2.7 g/dL — ABNORMAL LOW (ref 3.5–5.2)
Alkaline Phosphatase: 192 U/L — ABNORMAL HIGH (ref 39–117)
Bilirubin, Direct: <0.2 mg/dL (ref 0.0–0.3)
Total Bilirubin: 0.4 mg/dL (ref 0.3–1.2)
Total Protein: 6.1 g/dL (ref 6.0–8.3)

## 2013-02-27 LAB — BASIC METABOLIC PANEL
BUN: 19 mg/dL (ref 6–23)
CO2: 26 mEq/L (ref 19–32)
Calcium: 8.1 mg/dL — ABNORMAL LOW (ref 8.4–10.5)
Chloride: 98 mEq/L (ref 96–112)
Creatinine, Ser: 1.65 mg/dL — ABNORMAL HIGH (ref 0.50–1.10)
GFR calc Af Amer: 35 mL/min — ABNORMAL LOW (ref >90)
GFR calc non Af Amer: 30 mL/min — ABNORMAL LOW (ref >90)
Glucose, Bld: 96 mg/dL (ref 70–99)
Potassium: 4 mEq/L (ref 3.7–5.3)
Sodium: 139 mEq/L (ref 137–147)

## 2013-02-27 MED ORDER — FUROSEMIDE 40 MG PO TABS
60.0000 mg | ORAL_TABLET | Freq: Every day | ORAL | Status: DC
  Administered 2013-02-27 – 2013-02-28 (×2): 60 mg via ORAL
  Filled 2013-02-27 (×2): qty 1

## 2013-02-27 NOTE — Progress Notes (Signed)
Spoke with [facility] Liaison Dolores Lory today to discuss transition of care services.  Patient will be followed by [facility] for transition of care services.  In the event that the patient returns to [place] from [place].  At that time [facility] will engage her care as needed to include home visits for CHF management.  Of note, [facility] Care Management services does not replace or interfere with any services that are arranged by inpatient case management or social work.  For additional questions or referrals please contact Anibal Henderson BSN RN [facility] Liaison at [phone].

## 2013-02-27 NOTE — Progress Notes (Signed)
After speaking with the patient and the physician I understand her discharge plan is to now go home with her son and daughter in [place], [place].  I have arranged for her to follow-up at [facility] on February 5th at 1pm.  I will see her again tomorrow to give her the new patient packet that will need to be filled out prior to her appointment and to review heart failure education.  Driscilla Moats RN, BSN, PCCN--Heart Failure Navigator

## 2013-02-27 NOTE — Patient Instructions (Signed)
Please go to [facility] for follow-up appointment on 03/07/2012 at 1:00 pm with Dr. Shaune Pollack [phone] Bring all of your actual medications --A list of medications is not acceptable. Arrive 30 minutes prior to your appointment time with your new patient information filled out.

## 2013-02-27 NOTE — Progress Notes (Signed)
[facility] PROGRESS NOTE Interim History: 73 y.o. female smoker with a past medical history significant for congestive heart failure (EF 35% December 20, 2012) reportedly rolled into the emergency department via wheelchair with family members with severe shortness of breath, moaning grunting and unable to speak in complete sentences because of shortness of breath. The patient reports that she's been coughing profusely over the past several days. She was recently discharged approximately one week ago with an influenza A. infection that was treated with Tamiflu. The patient reports that when she arrived home she continued to cough and shortness of breath got progressively worse. She reports that she began experiencing deep pain in the chest with coughing and inspiration, BNP > 20k, AKI and PNA.  Filed Weights   02/25/13 0531 02/26/13 0613 02/27/13 0529  Weight: 47.4 kg (104 lb 8 oz) 47.7 kg (105 lb 2.6 oz) 42.683 kg (94 lb 1.6 oz)        Intake/Output Summary (Last 24 hours) at 02/27/13 1757 Last data filed at 02/27/13 1400  Gross per 24 hour  Intake 1120.86 ml  Output   3800 ml  Net -2679.14 ml     Assessment/Plan: Acute combined systolic and diastolic heart failure: - change lasix to PO -continue daily weight -BNP in am. - Sat > 96% on RA. No JVD. And no edema; mild crackles on exam.  - b-met daily - continue low dose coreg --no ACE/ARB at this moment due to renal failure  Hyperkalemia: - resolved due AKI - EKG showed peaked T wave QRS > 120. Was Given IV calcium gluconate, insulin, D5 and lasix IV and Give kayexalate, with improvement on her K. -no abnormalities on telemetry currently  HCAP (healthcare-associated pneumonia): -received IV vanc and cefepime started on 1.25.2014.  -transitioned now to levaquin (adjusted for kidney function) to finish treatment -no fever and WBC's WNL  Acute on chronic renal failure - due to systolic heart failure. ? Cardiorenal syndrome;  transient hypotension and diuresis. -lasix to be change to PO -follow kidney function -baseline Cr 1.0-1.2. Stage 2-3 at baseline  Transaminates: - ? Passive congestion due to heart failure. - improving; will continue to monitor  Constipation - resolved with kayexalate.  Epilepsy - cont Keppra and vimpat no seizure activity.  Protein-calorie malnutrition, severe - continue ensure TID.   Hyponatremia - due to heart failure. -resolved with diuresis -Na 139  Hypertension: - BP soft but stable -lasix now change to PO -continue b-blocker    Code Status: full Family Communication: none  Disposition Plan: discharge home in am if remains stable (go to [place] with son)   Consultants:  none  Procedures: ECHO: 11.29.2014: estimated ejection fraction was in the range of 35% to 40%. Diffuse hypokinesis.   Antibiotics:  Vanc and cefepime 1.26.2014  HPI/Subjective: Patient denies significant SOB or CP; positive scattered cough, no fever  Objective: Filed Vitals:   02/26/13 2051 02/27/13 0529 02/27/13 1300 02/27/13 1659  BP: 107/69 130/88 90/59 122/85  Pulse: 100 95 94 97  Temp: 98.2 F (36.8 C) 98.1 F (36.7 C) 98.1 F (36.7 C)   TempSrc: Oral Oral Oral   Resp: _0 Height:      Weight:  42.683 kg (94 lb 1.6 oz)    SpO2: 91% 94% 98%      Exam:  General: Alert, awake, oriented x3, in no acute distress. Afebrile and able to speak in full sentences HEENT: No bruits, no goiter.  Heart: Regular rate and rhythm,  without murmurs, rubs, gallops. No JVD Lungs: Good air movement bilateral; no wheezing and just mild crackles at bases   Abdomen: Soft, nontender, nondistended, positive bowel sounds.  Neuro: Grossly intact, nonfocal.   Data Reviewed: Basic Metabolic Panel:  Recent Labs Lab 02/24/13 0459 02/24/13 1050 02/25/13 0427 02/25/13 1515 02/26/13 0440 02/27/13 0418  NA 129*  --  131* 127* 134* 139  K 4.7  --  6.9* 6.1* 3.6* 4.0  CL 95*  --   98 95* 95* 98  CO2 16*  --  19 12* 24 26  GLUCOSE 172*  --  102* 77 101* 96  BUN 11  --  _0 CREATININE 0.97 1.08 1.48* 1.33* 1.44* 1.65*  CALCIUM 8.4  --  8.3* 8.7 8.3* 8.1*  MG  --  1.9 1.8  --   --   --    Liver Function Tests:  Recent Labs Lab 02/24/13 0459 02/25/13 0427 02/27/13 1450  AST 70* 62* 35  ALT 161* 134* 79*  ALKPHOS 275* 258* 192*  BILITOT 0.6 0.5 0.4  PROT 6.9 6.1 6.1  ALBUMIN 3.3* 2.8* 2.7*   CBC:  Recent Labs Lab 02/24/13 0459 02/24/13 1050 02/25/13 0427  WBC 10.4 8.6 8.2  NEUTROABS 7.6  --  5.9  HGB 12.6 12.8 11.2*  HCT 36.7 36.7 32.3*  MCV 98.9 99.2 98.5  PLT 467* 443* 409*   Cardiac Enzymes:  Recent Labs Lab 02/24/13 1050 02/24/13 1708 02/24/13 2355 02/25/13 2018  CKTOTAL  --   --   --  76  TROPONINI <0.30 <0.30 <0.30  --    BNP (last 3 results)  Recent Labs  02/24/13 0459 02/25/13 0427  PROBNP 31608.0* 30027.0*   CBG:  Recent Labs Lab 02/24/13 2124 02/25/13 0703 02/25/13 1120 02/25/13 1614 02/25/13 2126  GLUCAP 130* 115* 115* 103* 117*    Recent Results (from the past 240 hour(s))  CULTURE, BLOOD (ROUTINE X 2)     Status: None   Collection Time    02/24/13 10:50 AM      Result Value Range Status   Specimen Description BLOOD LEFT ARM   Final   Special Requests BOTTLES DRAWN AEROBIC ONLY 10CC   Final   Culture  Setup Time     Final   Value: 02/24/2013 16:43     Performed at [facility]   Culture     Final   Value:        BLOOD CULTURE RECEIVED NO GROWTH TO DATE CULTURE WILL BE HELD FOR 5 DAYS BEFORE ISSUING A FINAL NEGATIVE REPORT     Performed at [facility]   Report Status PENDING   Incomplete  CULTURE, BLOOD (ROUTINE X 2)     Status: None   Collection Time    02/24/13 11:15 AM      Result Value Range Status   Specimen Description BLOOD RIGHT ARM   Final   Special Requests BOTTLES DRAWN AEROBIC ONLY 2CC   Final   Culture  Setup Time     Final   Value: 02/24/2013 16:43      Performed at [facility]   Culture     Final   Value:        BLOOD CULTURE RECEIVED NO GROWTH TO DATE CULTURE WILL BE HELD FOR 5 DAYS BEFORE ISSUING A FINAL NEGATIVE REPORT     Performed at [facility]   Report Status PENDING   Incomplete  CLOSTRIDIUM DIFFICILE BY  PCR     Status: None   Collection Time    02/25/13  3:41 PM      Result Value Range Status   C difficile by pcr NEGATIVE  NEGATIVE Final     Studies: No results found.  Scheduled Meds: . aspirin  325 mg Oral q morning - 10a  . carvedilol  3.125 mg Oral BID WC  . feeding supplement (ENSURE)  1 Container Oral TID BM  . furosemide  60 mg Oral Daily  . heparin  5,000 Units Subcutaneous Q8H  . influenza vac split quadrivalent PF  0.5 mL Intramuscular Tomorrow-1000  . lacosamide  100 mg Oral BID  . levETIRAcetam  1,000 mg Oral BID  . [START ON 02/28/2013] levofloxacin  500 mg Oral Q48H  . phenytoin  100 mg Oral Daily  . pneumococcal 23 valent vaccine  0.5 mL Intramuscular Tomorrow-1000  . senna-docusate  1 tablet Oral BID  . sodium chloride  3 mL Intravenous Q12H   Continuous Infusions:    Time > 30 minutes  Ahniya Mitchum  [facility] Pager [phone] If 8PM-8AM, please contact night-coverage at [email or web address], password [facility] 02/27/2013, 5:57 PM  LOS: 3 days

## 2013-02-27 NOTE — Care Management Note (Addendum)
  Page 2 of 2   02/28/2013     2:49:27 PM   CARE MANAGEMENT NOTE 02/28/2013  Patient:  [name], [name]   Account Number:  [id number]  Date Initiated:  02/27/2013  Documentation initiated by:  Jermall Isaacson  Subjective/Objective Assessment:   Adm with CHF     Action/Plan:   Follow for disposition needs.   Anticipated DC Date:  02/28/2013   Anticipated DC Plan:  HOME/SELF CARE  In-house referral  Clinical Social Worker      DC Planning Services  CM consult      Choice offered to / List presented to:          [facility] arranged  HH-1 RN      [facility] agency  [facility]   Status of service:  Completed, signed off Medicare Important Message given?   (If response is "NO", the following Medicare IM given date fields will be blank) Date Medicare IM given:   Date Additional Medicare IM given:    Discharge Disposition:  HOME W HOME HEALTH SERVICES  Per UR Regulation:  Reviewed for med. necessity/level of care/duration of stay  If discussed at Long Length of Stay Meetings, dates discussed:    Comments:  02/28/2013 D/c to sons home today. [address] RN, BSN, [place], CCM [phone] 02/28/2013   02/27/2013 Social:  From Home alone.   Patient plans d/c to sons home for approximatly 1 month PH d/c. HCPOA Ralene Muskrat [address] [place], [place] [address] [phone] cell. PCP is [name]. DME:  No Scales Hx/o son has requested update on patients condition prior to d/c.  Son is not aware of New dx of CHF at this time. Discussed in IDT conference this am.  ( MD aware) CM Consult with patient; pt states son is not coming today and plans to come tomorrow.  MD notified and CM requested TC to son for update to meet d/c planning needs.  Hx/o DTR who may also be able to assist with D/c planning. Disposition Plan: [facility] - New to services HHS:  RN,  new diagnosis of CHF, Medication MGMT ALERT: Patient plans d/c home to son's home for approximately one month at  following address.  Please verify with patient or son prior to initial visit. HHS Provider: [facility] (elected by patient) HFC:  plans to transfer services to [facility] with goal of 1 week f/u for CHF f/u care or f/u in [facility]. [address] RN, BSN, [place], CCM [phone] 02/27/2013

## 2013-02-27 NOTE — Progress Notes (Signed)
UR completed Mahek Schlesinger K. Tawyna Pellot, RN, BSN, MSHL, CCM  02/27/2013 10:18 AM

## 2013-02-28 DIAGNOSIS — I10 Essential (primary) hypertension: Secondary | ICD-10-CM

## 2013-02-28 DIAGNOSIS — R569 Unspecified convulsions: Secondary | ICD-10-CM

## 2013-02-28 LAB — PRO B NATRIURETIC PEPTIDE: Pro B Natriuretic peptide (BNP): 16620 pg/mL — ABNORMAL HIGH (ref 0–125)

## 2013-02-28 MED ORDER — FUROSEMIDE 20 MG PO TABS: 60.0000 mg | ORAL_TABLET | Freq: Every day | ORAL | Status: DC

## 2013-02-28 MED ORDER — ENSURE PUDDING PO PUDG: 1.0000 | Freq: Three times a day (TID) | ORAL | Status: DC

## 2013-02-28 MED ORDER — LEVOFLOXACIN 500 MG PO TABS: 500.0000 mg | ORAL_TABLET | ORAL | Status: AC

## 2013-02-28 NOTE — Progress Notes (Signed)
Per MD order, PICC line removed. Cath intact at 35cm. Vaseline pressure gauze to site, pressure held x 5min. No bleeding to site. Pt instructed to keep dressing CDI x 24 hours. Avoid heavy lifting, pushing or pulling x 24 hours,  If bleeding occurs hold pressure, if bleeding does not stop contact MD or go to the ED. Pt does not have any questions. Kirsi Hugh M 

## 2013-02-28 NOTE — Progress Notes (Signed)
Pt d/c'd to home with son. D/c instruction and meds d/w son and pt. They verbalize understanding

## 2013-02-28 NOTE — Progress Notes (Signed)
I visited with Ms. majel, melo son and friend this afternoon.  I reviewed heart failure signs and symptoms, when to call the physician, daily weights, taking medications as prescribed, low sodium diet and fluid restriction.  They all verbalized understanding.  She is planning to discharge to home with her son and daughter in [place].  She has a follow-up appointment at [facility] scheduled 03/07/13 at 1pm with Dr. Shaune Pollack.  Driscilla Moats RN, BSN, PCCN--Heart Failure Navigator

## 2013-02-28 NOTE — Discharge Summary (Signed)
Physician Discharge Summary  [name] WYS:[id number] DOB: 1940/03/17 DOA: 02/24/2013  PCP: No primary provider on file.  Admit date: 02/24/2013 Discharge date: 02/28/2013  Time spent: >30 minutes  Recommendations for Outpatient Follow-up:  CMET to follow liver function, electrolytes and renal function Reassess BP and adjust medications as needed   BNP    Component Value Date/Time   PROBNP 16620.0* 02/28/2013 0524   Filed Weights   02/26/13 0613 02/27/13 0529 02/28/13 0536  Weight: 47.7 kg (105 lb 2.6 oz) 42.683 kg (94 lb 1.6 oz) 42.7 kg (94 lb 2.2 oz)    Discharge Diagnoses:  Acute combined systolic and diastolic congestive heart failure Hypertension Hyponatremia HCAP (healthcare-associated pneumonia) Liver enzyme elevation Protein-calorie malnutrition, severe Epilepsy Smoker Abdominal pain   Discharge Condition: stable and improved. Will discharge home with family care  Diet recommendation: low sodium heart healthy diet  History of present illness:  73 y.o. female smoker with a past medical history significant for congestive heart failure (EF 35% December 20, 2012) reportedly rolled into the emergency department via wheelchair with family members with severe shortness of breath, moaning grunting and unable to speak in complete sentences because of shortness of breath. The patient reports that she's been coughing profusely over the past several days. She was recently discharged approximately one week ago with an influenza A. infection that was treated with Tamiflu. The patient reports that when she arrived home she continued to cough and shortness of breath got progressively worse. She reports that she began experiencing deep pain in the chest with coughing and inspiration. She also reports swelling in the feet and abdomen. A couple of days ago she began to experience mild abdominal pain and constipation. The patient reports that she's having very hard stools. The patient  also reports that she has had some chills. The patient has been taking over-the-counter cough suppressant medication with no improvement.  In the emergency department the patient was evaluated with a chest x-ray that was suspicious for pneumonia. The patient also had a marked elevation of BNP. She had gained 10 pounds in the last 10 days. She was given a dose of Lasix and hospitalization was requested for further evaluation and management. The patient has a history as epilepsy but that has remained stable. The patient is also a smoker.   Hospital Course:  SOB due to Acute combined systolic and diastolic heart failure:  - change lasix to PO (25m Daily) -continue close weight follow up and low sodium diet as instructed -BNP 16000 range (down from 31000 on admission) - Sat > 96% on RA. No JVD,  no edema and no crackles at discharge on exam.  - b-met 1 week to follow electrolytes and renal function - continue low dose coreg  -no ACE/ARB at this moment due to renal failure  -[facility]  Hyperkalemia: - resolved; appears to be 2/2 AKI  - EKG showed peaked T wave QRS > 120. Was Given IV calcium gluconate, insulin, D5 and lasix IV and Give kayexalate, with improvement on her K.  -no further abnormalities on telemetry or EKG appreciated  HCAP (healthcare-associated pneumonia):  -received IV vanc and cefepime started on 1.25.2014.  -transitioned now to levaquin (adjusted for kidney function) to finish treatment  -no fever and WBC's WNL -good air movement and no cough.   Acute on chronic renal failure  - due to systolic heart failure. ? Cardiorenal syndrome; transient hypotension and diuresis.  -lasix  to be change to PO  -follow kidney function after discharge (1 week) -baseline Cr 1.0-1.2. Stage 2-3 at baseline  -Cr at discharge 1.6 range  Transaminates:  - ? Passive congestion due to heart failure.  - improving; will continue to monitor in  outpatient setting -AST 35 and ALT now 79; ALK phos 192  Constipation  - resolved with kayexalate. -K WNL at discharge   Epilepsy  - cont Keppra, dilantin  and vimpat no seizure activity appreciated during hospitalization   Protein-calorie malnutrition, severe  - continue ensure TID.   Hyponatremia  - due to heart failure.  -resolved with diuresis  -Na in 139 range at discharge -will need BMET in 1 week after discharge to follow electrolytes and renal function.  Tobacco abuse -cessation counseling provided -patient contemplating quitting.  Hypertension:  - BP stable  -lasix now change to PO  -continue b-blocker  -reassess in outpatient setting and adjust meds as needed   Procedures:  ECHO: 11.29.2014: estimated ejection fraction was in the range of 35% to 40%. Diffuse hypokinesis.  See below for x-ray reports    Consultations:  None   Discharge Exam: Filed Vitals:   02/28/13 0536  BP: 112/81  Pulse: 65  Temp: 97.3 F (36.3 C)  Resp: 18   General: Alert, awake, oriented x3, in no acute distress. Afebrile and able to speak in full sentences  HEENT: No bruits, no goiter.  Heart: Regular rate and rhythm, without murmurs, rubs, gallops. No JVD  Lungs: Good air movement bilateral; no wheezing and no crackles  Abdomen: Soft, nontender, nondistended, positive bowel sounds.  Neuro: Grossly intact, nonfocal.   Discharge Instructions  Discharge Orders   Future Appointments Provider Department Dept Phone   03/19/2013 11:30 AM Charlett Blake, MD [facility] [phone]   Future Orders Complete By Expires   Diet - low sodium heart healthy  As directed    Discharge instructions  As directed    Comments:     Take medications as prescribed Follow a low sodium diet (2 grams sodium max in 24 hours) Contact heart failure clinic with > 3 pounds gain overnight or > 5 pounds in 1 week Arrange follow up with PCP in 7-10 days       Medication  List    STOP taking these medications       oseltamivir 30 MG capsule  Commonly known as:  TAMIFLU      TAKE these medications       aspirin 325 MG tablet  Take 325 mg by mouth every morning.     carvedilol 3.125 MG tablet  Commonly known as:  COREG  Take 1 tablet (3.125 mg total) by mouth 2 (two) times daily with a meal.     dextromethorphan-guaiFENesin 30-600 MG per 12 hr tablet  Commonly known as:  MUCINEX DM  Take 1 tablet by mouth 2 (two) times daily.     feeding supplement (ENSURE) Pudg  Take 1 Container by mouth 3 (three) times daily between meals.     furosemide 20 MG tablet  Commonly known as:  LASIX  Take 3 tablets (60 mg total) by mouth daily.     lacosamide 200 MG Tabs tablet  Commonly known as:  VIMPAT  Take 0.5 tablets (100 mg total) by mouth 2 (two) times daily.     levETIRAcetam 1000 MG tablet  Commonly known as:  KEPPRA  Take 1 tablet (1,000 mg total) by mouth 2 (two) times daily.  levofloxacin 500 MG tablet  Commonly known as:  LEVAQUIN  Take 1 tablet (500 mg total) by mouth every other day.     phenytoin 50 MG tablet  Commonly known as:  DILANTIN  Chew 2 tablets (100 mg total) by mouth daily.       No Known Allergies    The results of significant diagnostics from this hospitalization (including imaging, microbiology, ancillary and laboratory) are listed below for reference.    Significant Diagnostic Studies: Dg Chest 2 View  02/25/2013   CLINICAL DATA:  CHF and pneumonia, history hypertension, MI, smoking  EXAM: CHEST  2 VIEW  COMPARISON:  02/24/2013  FINDINGS: Enlargement of cardiac silhouette.  Prominent right superior mediastinal soft tissues unchanged.  Atherosclerotic calcification aorta.  Bibasilar effusions much larger on right, increased on right since previous exam.  Increased opacification of the right lung base likely a combination of atelectasis and consolidation.  Improved aeration in left lower lobe.  Upper lungs clear.  No  pneumothorax.  Bones demineralized.  IMPRESSION: Increased right pleural effusion and basilar atelectasis.  Probable persisting consolidation in right lower lobe.  Small left pleural effusion with improved aeration in left lower lobe.   Electronically Signed   By: Lavonia Dana M.D.   On: 02/25/2013 08:40   Dg Chest 2 View  02/24/2013   CLINICAL DATA:  Constipation.  Nausea and cough.  EXAM: CHEST  2 VIEW  COMPARISON:  Chest radiograph performed 02/11/2013  FINDINGS: The lungs are well-aerated. Bibasilar airspace opacification raises concern for multifocal pneumonia. No definite pleural effusion or pneumothorax is seen. The appearance is less typical for interstitial edema.  The heart is mildly enlarged. Pulmonary vascularity is at the upper limits of normal. No acute osseous abnormalities are seen.  IMPRESSION: 1. Bibasilar airspace opacification raises concern for multifocal pneumonia. The appearance is less typical for pulmonary edema. 2. Mild cardiomegaly.   Electronically Signed   By: Garald Balding M.D.   On: 02/24/2013 06:04   Dg Chest 2 View  02/11/2013   CLINICAL DATA:  Left-sided chest pain  EXAM: CHEST  2 VIEW  COMPARISON:  Prior radiograph from 01/05/2013  FINDINGS: Moderate cardiomegaly is similar as compared to prior exam.  Lungs are normally inflated. Bilateral pleural effusions, left greater than right, are present. There are associated bibasilar patchy and linear opacities, which may reflect atelectasis or possibly infiltrates. No overt pulmonary edema. No pneumothorax.  Osseous structures are unchanged.  IMPRESSION: 1. Persistent small bilateral pleural effusions, left greater than right. 2. Patchy bibasilar opacities, likely atelectasis, however, possible developing infiltrates could also be considered in the correct clinical setting. 3. Stable cardiomegaly without pulmonary edema.   Electronically Signed   By: Jeannine Boga M.D.   On: 02/11/2013 18:47   Dg Lumbar Spine 2-3  Views  02/19/2013   CLINICAL DATA:  Low back pain.  EXAM: LUMBAR SPINE - 2-3 VIEW  COMPARISON:  CT CTA ABD/PEL  dated 04/16/2012  FINDINGS: Mild levoscoliosis in the lumbar spine. Moderate compression fracture through the superior endplate of L2. This is new since prior CT. Partial sacralization of the L5 vertebral body. Mild diffuse osteopenia.  IMPRESSION: Moderate compression fracture through the superior endplate of L2.  Mild levoscoliosis.   Electronically Signed   By: Rolm Baptise M.D.   On: 02/19/2013 14:57   Dg Abd 1 View  02/24/2013   CLINICAL DATA:  Constipation and nausea.  EXAM: ABDOMEN - 1 VIEW  COMPARISON:  Abdominal radiograph performed 01/01/2013  FINDINGS: The visualized bowel gas pattern is unremarkable. Scattered air filled loops of colon are seen; no abnormal dilatation of small bowel loops is seen to suggest small bowel obstruction. No free intra-abdominal air is identified, though evaluation for free air is limited on a single supine view.  The visualized osseous structures are within normal limits; the sacroiliac joints are unremarkable in appearance.  IMPRESSION: Unremarkable bowel gas pattern; no free intra-abdominal air seen. No radiographic evidence for significant constipation.   Electronically Signed   By: Garald Balding M.D.   On: 02/24/2013 06:05   Ct Head Wo Contrast  02/11/2013   CLINICAL DATA:  Weakness and body aches. Nausea and vomiting. Altered mental status.  EXAM: CT HEAD WITHOUT CONTRAST  TECHNIQUE: Contiguous axial images were obtained from the base of the skull through the vertex without intravenous contrast.  COMPARISON:  01/27/2013.  FINDINGS: No mass lesion, mass effect, midline shift, hydrocephalus, hemorrhage. No acute territorial cortical ischemia/infarct. Atrophy and chronic ischemic white matter disease is present. Old right basal ganglia infarct seen on prior MRI. Posterior fossa structures are within normal limits. The calvarium is intact. Intracranial  atherosclerosis.  IMPRESSION: Atrophy and chronic ischemic white matter disease with old right basal ganglia lacunar infarct. No acute intracranial abnormality.   Electronically Signed   By: Dereck Ligas M.D.   On: 02/11/2013 18:17   US Abdomen Complete  02/12/2013   CLINICAL DATA:  Renal failure and abnormal hepatic function studies.  EXAM: ULTRASOUND ABDOMEN COMPLETE  COMPARISON:  None.  FINDINGS: Gallbladder:  The gallbladder is adequately distended with no evidence of stones, wall thickening, or pericholecystic fluid. There is no positive sonographic Murphy's sign.  Common bile duct:  Diameter: 3.9 mm in diameter which is normal. No abnormal intraluminal echoes are demonstrated.  Liver:  The liver demonstrates normal contour and echogenicity. There are increased echoes however associated with the bowel ducts without significant distal shadowing. While this could reflect some air within the biliary tree fibrosis associated with the bile ducts cannot be excluded. No discrete hepatic parenchymal mass is demonstrated. There is a to and fro portal venous flow pattern which may reflect increased portal venous pressures due to hepatic parenchymal disease.  IVC:  No abnormality visualized.  Pancreas:  Visualized portion unremarkable.  Spleen:  The spleen exhibits normal echotexture and measures 5.3 cm in greatest dimension.  Right Kidney:  Length: 9.1 cm. The echotexture of the renal cortex on the right is approximately equal to that of the adjacent liver. No hydronephrosis or parenchymal mass is demonstrated.  Left Kidney:  Length: 9.6 cm. The cortical echotexture of the left kidney is subjectively increased. There is no hydronephrosis on the left.  Abdominal aorta:  The abdominal aorta exhibits mild failure to taper of its caliber with maximal diameter 2.6 cm demonstrated below the kidneys.  Other findings:  There are bilateral pleural effusions.  Ascites is present.  IMPRESSION: 1. There are abnormal  intrahepatic echoes associated with the bile ducts which may reflect pneumobilia or could reflect fibrotic change associated with the wall of the bile ducts. In addition there are findings that may reflect increased portal venous pressure due to a to-and-fro type portal venous flow direction on Doppler interrogation. 2. The gallbladder exhibits no evidence of stones. The common bile duct is not abnormally dilated. 3. The echotexture of both kidneys is increased suggesting medical renal disease. No hydronephrosis is demonstrated. 4. There is atherosclerotic failure to taper the caliber of the abdominal aorta with maximal  diameter demonstrated distally of 2.6 cm. 5. There is ascites and there are bilateral pleural effusions. 6. Follow-up MRI of the abdomen may be a useful next diagnostic imaging step.   Electronically Signed   By: David  Martinique   On: 02/12/2013 08:21   Mr Abd W Cm/mrcp  02/13/2013   CLINICAL DATA:  Cough with chills body aches and fusions. Elevated LFTs with abnormal liver ultrasound.  EXAM: MRI ABDOMEN WITHOUT AND WITH CONTRAST  TECHNIQUE: Multiplanar multisequence MR imaging of the abdomen was performed both before and after the administration of intravenous contrast.  CONTRAST:  61m MULTIHANCE GADOBENATE DIMEGLUMINE 529 MG/ML IV SOLN  COMPARISON:  Ultrasound from 02/12/2013  FINDINGS: Bilateral pleural effusions with collapse/ consolidation in the lower lobes seen in the lower chest.  Evaluation of the abdomen is markedly limited by the patient's chronic cough and difficulty with reproducible breath holding.  Within the limitation of motion artifact, no focal parenchymal abnormality is evident within the liver. There is no intra or extrahepatic biliary duct dilatation. The main portal vein is patent. There is flow signal in the right and left portal vein. The portal venous structures do opacify the after IV contrast administration. .  There is no discernible mass lesion within the pancreas. The  main pancreatic duct is nondilated.  There is no evidence for splenomegaly. No focal abnormality is identified in the spleen. The stomach, duodenum, adrenal glands, and kidneys are unremarkable.  No gallstones. There is a question of gallbladder wall thickening/edema on today's MRI  No abnormal cyst marrow signal within the visualized skeleton. Small volume perihepatic ascites is evident. There is some fluid around the spleen. High signal intensity assisted with the portal triads in the liver likely related to a degree of periportal edema.  IMPRESSION: Liver assessment markedly limited by the patient's chronic cough and inability to reproducibly breath hold. There is likely periportal edema. No biliary dilatation is evident. There is no MR evidence for occlusive portal venous thrombus.  Question gallbladder wall thickening/edema. No stones are seen within the lumen of the gallbladder. The gallbladder wall changes may be related to systemic disease given the associated ascites and pleural effusions.  Bilateral lung collapse/consolidation and pleural effusions within the visualized chest.   Electronically Signed   By: EMisty StanleyM.D.   On: 02/13/2013 13:36    Microbiology: Recent Results (from the past 240 hour(s))  CULTURE, BLOOD (ROUTINE X 2)     Status: None   Collection Time    02/24/13 10:50 AM      Result Value Range Status   Specimen Description BLOOD LEFT ARM   Final   Special Requests BOTTLES DRAWN AEROBIC ONLY 10CC   Final   Culture  Setup Time     Final   Value: 02/24/2013 16:43     [facility]  Culture     Final   Value:        BLOOD CULTURE RECEIVED NO GROWTH TO DATE CULTURE WILL BE HELD FOR 5 DAYS BEFORE ISSUING A FINAL NEGATIVE REPORT     Performed at [facility]  Report Status PENDING   Incomplete  CULTURE, BLOOD (ROUTINE X 2)     Status: None   Collection Time    02/24/13 11:15 AM      Result Value Range Status   Specimen Description BLOOD  RIGHT ARM   Final   Special Requests BOTTLES DRAWN AEROBIC ONLY [place]  Final   Culture  Setup Time     Final   Value: 02/24/2013 16:43     Performed at [facility]   Culture     Final   Value:        BLOOD CULTURE RECEIVED NO GROWTH TO DATE CULTURE WILL BE HELD FOR 5 DAYS BEFORE ISSUING A FINAL NEGATIVE REPORT     Performed at [facility]   Report Status PENDING   Incomplete  CLOSTRIDIUM DIFFICILE BY PCR     Status: None   Collection Time    02/25/13  3:41 PM      Result Value Range Status   C difficile by pcr NEGATIVE  NEGATIVE Final     Labs: Basic Metabolic Panel:  Recent Labs Lab 02/24/13 0459 02/24/13 1050 02/25/13 0427 02/25/13 1515 02/26/13 0440 02/27/13 0418  NA 129*  --  131* 127* 134* 139  K 4.7  --  6.9* 6.1* 3.6* 4.0  CL 95*  --  98 95* 95* 98  CO2 16*  --  19 12* 24 26  GLUCOSE 172*  --  102* 77 101* 96  BUN 11  --  _0 CREATININE 0.97 1.08 1.48* 1.33* 1.44* 1.65*  CALCIUM 8.4  --  8.3* 8.7 8.3* 8.1*  MG  --  1.9 1.8  --   --   --    Liver Function Tests:  Recent Labs Lab 02/24/13 0459 02/25/13 0427 02/27/13 1450  AST 70* 62* 35  ALT 161* 134* 79*  ALKPHOS 275* 258* 192*  BILITOT 0.6 0.5 0.4  PROT 6.9 6.1 6.1  ALBUMIN 3.3* 2.8* 2.7*   CBC:  Recent Labs Lab 02/24/13 0459 02/24/13 1050 02/25/13 0427  WBC 10.4 8.6 8.2  NEUTROABS 7.6  --  5.9  HGB 12.6 12.8 11.2*  HCT 36.7 36.7 32.3*  MCV 98.9 99.2 98.5  PLT 467* 443* 409*   Cardiac Enzymes:  Recent Labs Lab 02/24/13 1050 02/24/13 1708 02/24/13 2355 02/25/13 2018  CKTOTAL  --   --   --  90  TROPONINI <0.30 <0.30 <0.30  --    BNP: BNP (last 3 results)  Recent Labs  02/24/13 0459 02/25/13 0427 02/28/13 0524  PROBNP 31608.0* 30027.0* 16620.0*   CBG:  Recent Labs Lab 02/24/13 2124 02/25/13 0703 02/25/13 1120 02/25/13 1614 02/25/13 2126  GLUCAP 130* 115* 115* 103* 117*    Signed:  Kamiyah Kindel  [facility] 02/28/2013, 12:23  PM

## 2013-03-02 LAB — CULTURE, BLOOD (ROUTINE X 2)
Culture: NO GROWTH
Culture: NO GROWTH

## 2013-03-06 ENCOUNTER — Encounter: Payer: Self-pay | Admitting: Neurology

## 2013-03-06 ENCOUNTER — Encounter (INDEPENDENT_AMBULATORY_CARE_PROVIDER_SITE_OTHER): Payer: Self-pay

## 2013-03-06 ENCOUNTER — Ambulatory Visit (INDEPENDENT_AMBULATORY_CARE_PROVIDER_SITE_OTHER): Payer: Medicare HMO | Admitting: Neurology

## 2013-03-06 VITALS — BP 134/89 | HR 101 | Ht 59.0 in | Wt 88.0 lb

## 2013-03-06 DIAGNOSIS — G40309 Generalized idiopathic epilepsy and epileptic syndromes, not intractable, without status epilepticus: Secondary | ICD-10-CM

## 2013-03-06 NOTE — Progress Notes (Signed)
[facility] [address] [place]. [place] [address] [phone]       OFFICE CONSULT NOTE  [name] Date of Birth:  07/06/1940 Medical Record Number:  [id number]   Referring MD:  Donnalee Curry  Reason for Referral:  Seizures and stroke  HPI:  66 year lady who was admitted on 12/28/12 with altered mental status as well as recurrent seizures with partial onset. She was initially treated with Dilantin but had breakthrough seizures and subsequently Keppra was added as well as eventually intact. She had an EEG which showed potential epileptogenic focus in the left parieto-occipital region. She had right-sided weakness and balance difficulties as well as subsequently memory problems and delayed processing. MRI scan of the brain showed an acute right basal ganglia lacunar infarct. Echocardiogram showed decreased ejection fraction of 35-40% with diffuse hypokinesis and Doppler studies were unremarkable. Lipid profile was normal.She improved during the hospitalization and subsequently was transferred to inpatient rehabilitation where she made slow steady improvement. She was eventually discharged home. The patient is able to walk with a walker still has some residual left-sided weakness which is old from her previous stroke. She however complains of feeling tired and having a lot of fatigue as well as dizziness and the family feels is related to is her seizure medications. She is currently on Dilantin 100 mg at night, Keppra 1000 mg twice daily and was vimpat 100 twice daily. She has no prior history of preceding seizures. She has not had any recurrent seizure since she left the hospital.  ROS:   14 system review of systems is positive for  difficulty walking, leg weakness, and weakness, tiredness, fatigue, dizziness, numbness, sleeping difficulties and all other systems negative  PMH:  Past Medical History  Diagnosis Date  . MI (myocardial infarction)   . Hypertension     . Stroke   . Congestive heart failure (CHF)     EF 35% Nov 2014  . Epilepsy   . Smoker     Social History:  History   Social History  . Marital Status: Single    Spouse Name: N/A    Number of Children: 3  . Years of Education: N/A   Occupational History  . Not on file.   Social History Main Topics  . Smoking status: Current Some Day Smoker    Types: Cigarettes  . Smokeless tobacco: Not on file  . Alcohol Use: Yes     Comment: occasional  . Drug Use: No  . Sexual Activity: Not on file   Other Topics Concern  . Not on file   Social History Narrative  . No narrative on file    Medications:   Current Outpatient Prescriptions on File Prior to Visit  Medication Sig Dispense Refill  . aspirin 325 MG tablet Take 325 mg by mouth every morning.       . carvedilol (COREG) 3.125 MG tablet Take 1 tablet (3.125 mg total) by mouth 2 (two) times daily with a meal.  60 tablet  1  . dextromethorphan-guaiFENesin (MUCINEX DM) 30-600 MG per 12 hr tablet Take 1 tablet by mouth 2 (two) times daily.      . feeding supplement, ENSURE, (ENSURE) PUDG Take 1 Container by mouth 3 (three) times daily between meals.    0  . furosemide (LASIX) 20 MG tablet Take 3 tablets (60 mg total) by mouth daily.  90 tablet  1  . lacosamide (VIMPAT) 200 MG TABS tablet Take 0.5 tablets (100 mg  total) by mouth 2 (two) times daily.  30 tablet  1  . levETIRAcetam (KEPPRA) 1000 MG tablet Take 1 tablet (1,000 mg total) by mouth 2 (two) times daily.  60 tablet  1  . levofloxacin (LEVAQUIN) 500 MG tablet Take 1 tablet (500 mg total) by mouth every other day.  4 tablet  0  . phenytoin (DILANTIN) 50 MG tablet Chew 2 tablets (100 mg total) by mouth daily.  120 tablet  1   No current facility-administered medications on file prior to visit.    Allergies:  No Known Allergies  Physical Exam General: well developed, well nourished, seated, in no evident distress Head: head normocephalic and atraumatic. Orohparynx  benign Neck: supple with no carotid or supraclavicular bruits Cardiovascular: regular rate and rhythm, no murmurs Musculoskeletal: no deformity Skin:  no rash/petichiae Vascular:  Normal pulses all extremities Filed Vitals:   03/06/13 1319  BP: 134/89  Pulse: 101    Neurologic Exam Mental Status: Awake and fully alert. Oriented to place and time. Recent and remote memory intact. Attention span, concentration and fund of knowledge appropriate. Mood and affect appropriate.  Cranial Nerves: Fundoscopic exam reveals sharp disc margins. Pupils equal, briskly reactive to light. Extraocular movements full without nystagmus. Visual fields full to confrontation. Hearing intact. Facial sensation intact. Face, tongue, palate moves normally and symmetrically.  Motor: Mild left hemiparesis with left grip weakness. Diminished fine finger movements on the left. Orbits right over left upper extremity. Mild left hip flexor ankle dorsiflexor weakness. Increased tone on the left side. Reflexes are brisk on the left compared to the right side Sensory.: intact to tough and pinprick and vibratory.  Coordination: Rapid alternating movements normal in all extremities. Finger-to-nose and heel-to-shin performed accurately bilaterally. Gait and Station: Arises from chair without difficulty. Stance is normal. Gait demonstrates mild circumduction on the left and uses a  Reflexes: 2+ and asymmetric brisker on the left. Toes downgoing.   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 She is having side effects from being on 3 anticonvulsants. 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 I recommend stopping Dilantin and reducing the dose of Keppra to 500 mg twice daily and continuing them packed 100 twice daily as she seems to be having a lot of sleepiness, tiredness and lethargy which may be medication side effects. Continue aspirin for stroke prevention and I counseled her to quit smoking completely. She was advised not to drive. Return for followup in 2 months with Heide GuileLynn Lam, NP.   Note: This document was prepared with digital dictation and possible smart phrase technology. Any transcriptional errors that result from this process are unintentional.

## 2013-03-06 NOTE — Patient Instructions (Signed)
I had a long discussion with the patient, son and daughter-in-law regarding her recent stroke and symptomatic seizures, discussed stroke prevention strategies, seizure prevention and answered questions. I recommend stopping Dilantin and reducing the dose of Keppra to 500 mg twice daily and continuing them packed 100 twice daily as she seems to be having a lot of sleepiness, tiredness and lethargy which may be medication side effects. Continue aspirin for stroke prevention and I counseled her to quit smoking completely. She was advised not to drive. Return for followup in 2 months with Heide Guile, NP.

## 2013-03-15 ENCOUNTER — Other Ambulatory Visit: Payer: Medicare HMO | Admitting: Radiology

## 2013-03-19 ENCOUNTER — Ambulatory Visit: Payer: Medicare HMO | Admitting: Physical Medicine & Rehabilitation

## 2013-04-10 ENCOUNTER — Telehealth: Payer: Self-pay

## 2013-04-10 ENCOUNTER — Other Ambulatory Visit: Payer: Self-pay | Admitting: Neurology

## 2013-04-10 MED ORDER — LEVETIRACETAM 500 MG PO TABS: 500.0000 mg | ORAL_TABLET | Freq: Two times a day (BID) | ORAL | Status: DC

## 2013-04-10 MED ORDER — LACOSAMIDE 100 MG PO TABS: 100.0000 mg | ORAL_TABLET | Freq: Two times a day (BID) | ORAL | Status: DC

## 2013-04-10 NOTE — Telephone Encounter (Signed)
Last OV says:  I recommend stopping Dilantin and reducing the dose of Keppra to 500 mg twice daily and continuing them packed (Vimpat) 100 twice daily as she seems to be having a lot of sleepiness, tiredness and lethargy which may be medication side effects.

## 2013-04-10 NOTE — Telephone Encounter (Signed)
Needs Levetiracetam and Vimpat filled

## 2013-04-10 NOTE — Telephone Encounter (Signed)
Patient called requesting keppra and vimpat refills.  Advised patient to contact neurologist.  Phone number was given.  Patient will call back if she has any problems.

## 2013-04-11 ENCOUNTER — Telehealth: Payer: Self-pay

## 2013-04-11 NOTE — Telephone Encounter (Signed)
Rx signed and faxed.

## 2013-04-11 NOTE — Telephone Encounter (Signed)
Patient's daughter called and stated patient needs refills on her medication. She didn't say which meds. Left patient a voicemail to return call to clinic.

## 2013-04-22 ENCOUNTER — Telehealth: Payer: Self-pay | Admitting: Neurology

## 2013-04-22 ENCOUNTER — Encounter: Payer: Medicare HMO | Attending: Physical Medicine & Rehabilitation

## 2013-04-22 ENCOUNTER — Ambulatory Visit (HOSPITAL_BASED_OUTPATIENT_CLINIC_OR_DEPARTMENT_OTHER): Payer: Medicare HMO | Admitting: Physical Medicine & Rehabilitation

## 2013-04-22 ENCOUNTER — Encounter: Payer: Self-pay | Admitting: Physical Medicine & Rehabilitation

## 2013-04-22 VITALS — HR 91 | Resp 14 | Ht 62.0 in | Wt 81.0 lb

## 2013-04-22 DIAGNOSIS — M545 Low back pain, unspecified: Secondary | ICD-10-CM | POA: Insufficient documentation

## 2013-04-22 DIAGNOSIS — R748 Abnormal levels of other serum enzymes: Secondary | ICD-10-CM | POA: Insufficient documentation

## 2013-04-22 DIAGNOSIS — R279 Unspecified lack of coordination: Secondary | ICD-10-CM | POA: Insufficient documentation

## 2013-04-22 DIAGNOSIS — I69998 Other sequelae following unspecified cerebrovascular disease: Secondary | ICD-10-CM | POA: Insufficient documentation

## 2013-04-22 DIAGNOSIS — I10 Essential (primary) hypertension: Secondary | ICD-10-CM | POA: Insufficient documentation

## 2013-04-22 DIAGNOSIS — F17200 Nicotine dependence, unspecified, uncomplicated: Secondary | ICD-10-CM | POA: Insufficient documentation

## 2013-04-22 DIAGNOSIS — I6350 Cerebral infarction due to unspecified occlusion or stenosis of unspecified cerebral artery: Secondary | ICD-10-CM

## 2013-04-22 DIAGNOSIS — I639 Cerebral infarction, unspecified: Secondary | ICD-10-CM

## 2013-04-22 DIAGNOSIS — I252 Old myocardial infarction: Secondary | ICD-10-CM | POA: Insufficient documentation

## 2013-04-22 DIAGNOSIS — I69993 Ataxia following unspecified cerebrovascular disease: Secondary | ICD-10-CM

## 2013-04-22 DIAGNOSIS — I6381 Other cerebral infarction due to occlusion or stenosis of small artery: Secondary | ICD-10-CM

## 2013-04-22 NOTE — Progress Notes (Signed)
Subjective:    Patient ID: [name], female    DOB: 09/21/1940, 73 y.o.   MRN: [id number]  HPI [name] is a 73 y.o. right-handed female with history of CVA, hypertension as well as MI who was admitted 12/28/2012 with altered mental status as well as status epilepticus. MRI of the brain showed acute 1 cm right basal ganglia infarction as well as chronic hemorrhage left temporal region. EEG consistent with a potential area for epileptogenicity in the left parietal-occipital region. Patient loaded with Keppra/Dilantin as well as Vimpat  CIR  Admit date: 01/04/2013  Discharge date: 01/15/2013 Took last dose of Keppra as well as Vimpat today  He neurology followup appointment in February, neurology refilled Keppra and Vimpat [facility] [place]  [facility]OT [facility]  [facility] Last visit with me on January 20 was complaining of low back pain but this has improved Pain Inventory Average Pain 3 Pain Right Now 1 My pain is dull  In the last 24 hours, has pain interfered with the following? General activity 0 Relation with others 1 Enjoyment of life 0 What TIME of day is your pain at its worst? night Sleep (in general) Good  Pain is worse with: walking Pain improves with: pacing activities Relief from Meds: 1  Mobility use a cane ability to climb steps?  yes do you drive?  no needs help with transfers  Function not employed: date last employed 2012 retired I need assistance with the following:  bathing and household duties  Neuro/Psych loss of taste or smell  Prior Studies Any changes since last visit?  no  Physicians involved in your care Any changes since last visit?  no   Family History  Problem Relation Age of Onset  . Cancer Father   . Anemia Daughter    History   Social History  . Marital Status: Single    Spouse Name: N/A    Number of Children: 3  . Years of Education: N/A   Social History  Main Topics  . Smoking status: Current Some Day Smoker    Types: Cigarettes  . Smokeless tobacco: None  . Alcohol Use: Yes     Comment: occasional  . Drug Use: No  . Sexual Activity: None   Other Topics Concern  . None   Social History Narrative  . None   History reviewed. No pertinent past surgical history. Past Medical History  Diagnosis Date  . MI (myocardial infarction)   . Hypertension   . Stroke   . Congestive heart failure (CHF)     EF 35% Nov 2014  . Epilepsy   . Smoker    BP   Pulse 91  Resp 14  Ht 5\' 2"  (1.575 m)  Wt 81 lb (36.741 kg)  BMI 14.81 kg/m2  SpO2 99%  Opioid Risk Score:   Fall Risk Score: Moderate Fall Risk (6-13 points) (patient educated handout declined)   Review of Systems  Constitutional: Positive for appetite change.  Gastrointestinal: Positive for constipation.  Musculoskeletal: Positive for back pain.  All other systems reviewed and are negative.       Objective:   Physical Exam No dysmetria finger-nose-finger Normal range of motion lumbar No pain to palpation lumbar paraspinal muscles Motor strength is 4/5 in the left upper extremity 5/5 in right upper extremity Positive dysdiadochokinesis rapid alternating supination pronation the left upper  Normal sensation to light touch in bilateral upper  Assessment & Plan:  1. Right basal ganglia infarct with residual balance impairment mild fine motor gross motor impairment left upper extremity Recommend outpatient PT OT  2. Back pain improved will monitor  Return to clinic 6 weeks

## 2013-04-22 NOTE — Patient Instructions (Addendum)
Return for followup in 2 months with Taylor Guile, NP. Call or stop by [facility] neurology

## 2013-04-22 NOTE — Telephone Encounter (Signed)
Pt called concerning her medication (Vimpat) 100 twice daily pt states it was not called in but she did get her Keppra. Pt did call on these prescriptions on 04/10/13 but didn't see the refill fax for her Vimpat. Please call pt once this has been called in so that she may go pick it up. Thanks

## 2013-04-22 NOTE — Telephone Encounter (Signed)
This Rx was faxed to the pharmacy on 03/12.  I called the pharmacy.  Was on hold for over 30 minutes.  They said they don;t have a patient with this name, so they didn't fill the Rx.  I called the patient back to verify pharmacy was correct and she said it was.  I have refaxed the Rx.  They will follow up with the pharmacy regarding when it will be ready.

## 2013-04-26 ENCOUNTER — Ambulatory Visit: Payer: Medicare HMO | Admitting: Physical Medicine & Rehabilitation

## 2013-05-20 ENCOUNTER — Other Ambulatory Visit (INDEPENDENT_AMBULATORY_CARE_PROVIDER_SITE_OTHER): Payer: Commercial Managed Care - HMO

## 2013-05-20 DIAGNOSIS — G40309 Generalized idiopathic epilepsy and epileptic syndromes, not intractable, without status epilepticus: Secondary | ICD-10-CM

## 2013-06-04 ENCOUNTER — Encounter: Payer: Medicare HMO | Attending: Physical Medicine & Rehabilitation

## 2013-06-04 ENCOUNTER — Ambulatory Visit: Payer: Medicare HMO | Admitting: Physical Medicine & Rehabilitation

## 2013-06-04 DIAGNOSIS — R748 Abnormal levels of other serum enzymes: Secondary | ICD-10-CM | POA: Insufficient documentation

## 2013-06-04 DIAGNOSIS — I69998 Other sequelae following unspecified cerebrovascular disease: Secondary | ICD-10-CM | POA: Insufficient documentation

## 2013-06-04 DIAGNOSIS — R279 Unspecified lack of coordination: Secondary | ICD-10-CM | POA: Insufficient documentation

## 2013-06-04 DIAGNOSIS — I10 Essential (primary) hypertension: Secondary | ICD-10-CM | POA: Insufficient documentation

## 2013-06-04 DIAGNOSIS — M545 Low back pain, unspecified: Secondary | ICD-10-CM | POA: Insufficient documentation

## 2013-06-04 DIAGNOSIS — F17200 Nicotine dependence, unspecified, uncomplicated: Secondary | ICD-10-CM | POA: Insufficient documentation

## 2013-06-04 DIAGNOSIS — I252 Old myocardial infarction: Secondary | ICD-10-CM | POA: Insufficient documentation

## 2013-06-07 ENCOUNTER — Telehealth: Payer: Self-pay | Admitting: Neurology

## 2013-06-07 NOTE — Telephone Encounter (Signed)
I called and gave her the EEG results.   Negative for seizures. She verbalized understanding.

## 2013-06-07 NOTE — Telephone Encounter (Signed)
Patient calling to get test results--please call--thank you.

## 2013-06-13 ENCOUNTER — Telehealth: Payer: Self-pay | Admitting: *Deleted

## 2013-06-13 ENCOUNTER — Other Ambulatory Visit: Payer: Self-pay | Admitting: Neurology

## 2013-06-13 MED ORDER — LEVETIRACETAM 500 MG PO TABS: 500.0000 mg | ORAL_TABLET | Freq: Two times a day (BID) | ORAL | Status: DC

## 2013-06-13 NOTE — Telephone Encounter (Signed)
Ok will do so 

## 2013-06-13 NOTE — Telephone Encounter (Signed)
Pt called, saw Dr. Nicholos Johns, pcp this am.  Noted that she should be taking keppra 500mg  po bid.  Pt has been taking 1000mg  po bid since her ED visit in January 2015.  Dr. Pearlean Brownie saw and changed dosing in 04-10-13.  Due to misunderstanding, pt stopped the dilantin but continued the keppra (she had 1000mg  tabs) when dosing should have been 500mg  po bid.  Is taking vimpat 100mg  po bid.  Needs new prescription sent to [facility] [address]  (of 500mg  tabs).  I also spoke to her caregiver, Lelon Mast to confirm.  She verbalized understanding.  FYI

## 2013-06-28 ENCOUNTER — Ambulatory Visit (HOSPITAL_COMMUNITY)
Admission: RE | Admit: 2013-06-28 | Discharge: 2013-06-28 | Disposition: A | Discharge status: Home / Self Care | Payer: Medicare HMO | Source: Ambulatory Visit | Attending: Family Medicine | Admitting: Family Medicine

## 2013-06-28 ENCOUNTER — Other Ambulatory Visit (HOSPITAL_COMMUNITY): Payer: Self-pay | Admitting: Family Medicine

## 2013-06-28 ENCOUNTER — Ambulatory Visit (HOSPITAL_COMMUNITY)
Admit: 2013-06-28 | Discharge: 2013-06-28 | Disposition: A | Discharge status: Home / Self Care | Payer: Medicare HMO | Attending: Family Medicine | Admitting: Family Medicine

## 2013-06-28 DIAGNOSIS — M79609 Pain in unspecified limb: Secondary | ICD-10-CM | POA: Insufficient documentation

## 2013-06-28 DIAGNOSIS — R52 Pain, unspecified: Secondary | ICD-10-CM

## 2013-06-28 DIAGNOSIS — M25559 Pain in unspecified hip: Secondary | ICD-10-CM | POA: Insufficient documentation

## 2013-07-08 ENCOUNTER — Telehealth: Payer: Self-pay | Admitting: Neurology

## 2013-07-08 ENCOUNTER — Encounter: Payer: Self-pay | Admitting: Neurology

## 2013-07-08 NOTE — Telephone Encounter (Signed)
Left message for patient regarding rescheduling 08/20/13 appointment per Dr. Sethi's schedule, printed and mailed letter with new appointment time.  °

## 2013-07-11 ENCOUNTER — Telehealth: Payer: Self-pay | Admitting: Neurology

## 2013-07-11 NOTE — Telephone Encounter (Signed)
Patient's caregiver Lelon Mast calling to state that patient sleeps too much and is losing too much weight, is wondering if patient can take her Keppra just at night and not during the day. Please return call and advise.

## 2013-07-15 NOTE — Telephone Encounter (Signed)
May reduce keppra to 250 mg twice daily but call back if breakthru seizures

## 2013-07-15 NOTE — Telephone Encounter (Signed)
Pt's caregiver Lelon Mast calling stating that pt is sleeping to much and losing too much weight and would like to know if pt could just take Keppra just at night. Pt takes Keppra 500 mg twice a day. Please advise

## 2013-07-15 NOTE — Telephone Encounter (Signed)
Called pt to inform per Dr. Pearlean Brownie that the pt may reduce keppra to 250 mg twice daily and to call back if break thru seizures. I advised the pt that if she has any other problems, questions or concerns to call the office. Pt verbalized understanding.

## 2013-07-19 ENCOUNTER — Ambulatory Visit: Payer: Medicare HMO | Admitting: Physical Therapy

## 2013-07-19 ENCOUNTER — Ambulatory Visit: Payer: Medicare HMO

## 2013-08-20 ENCOUNTER — Ambulatory Visit: Payer: Medicare HMO | Admitting: Neurology

## 2013-09-05 ENCOUNTER — Ambulatory Visit: Payer: Commercial Managed Care - HMO | Admitting: Neurology

## 2013-09-05 NOTE — Telephone Encounter (Signed)
Noted  

## 2013-09-17 ENCOUNTER — Encounter: Payer: Self-pay | Admitting: Neurology

## 2013-09-17 ENCOUNTER — Ambulatory Visit (INDEPENDENT_AMBULATORY_CARE_PROVIDER_SITE_OTHER): Payer: Commercial Managed Care - HMO | Admitting: Neurology

## 2013-09-17 VITALS — BP 129/80 | HR 76 | Ht 62.0 in | Wt 80.0 lb

## 2013-09-17 DIAGNOSIS — G40219 Localization-related (focal) (partial) symptomatic epilepsy and epileptic syndromes with complex partial seizures, intractable, without status epilepticus: Secondary | ICD-10-CM

## 2013-09-17 NOTE — Patient Instructions (Signed)
I had a long discussion with the patient regarding her seizures and stressed the need to be compliant with her Vimpat and Keppra in the current dosages. She she can drive again now that she has been seizure-free for more than 6 months but I recommend she limit her driving the daytime and short distances only. Advised to return for followup in 6 months with Heide Guile, NP  or call earlier if necessary

## 2013-09-17 NOTE — Progress Notes (Signed)
[facility] [address] [place]. [place] [id number] [phone]       OFFICE CONSULT NOTE  Ms. Taylor BarreAngela Debold Date of Birth:  March 16, 1940 Medical Record Number:  [id number]   Referring MD:  Donnalee CurryAdeline Viyuoh  Reason for Referral:  Seizures and stroke  HPI:  [id number] year lady who was admitted on 12/28/12 with altered mental status as well as recurrent seizures with partial onset. She was initially treated with Dilantin but had breakthrough seizures and subsequently Keppra was added as well as eventually intact. She had an EEG which showed potential epileptogenic focus in the left parieto-occipital region. She had right-sided weakness and balance difficulties as well as subsequently memory problems and delayed processing. MRI scan of the brain showed an acute right basal ganglia lacunar infarct. Echocardiogram showed decreased ejection fraction of 35-40% with diffuse hypokinesis and Doppler studies were unremarkable. Lipid profile was normal.She improved during the hospitalization and subsequently was transferred to inpatient rehabilitation where she made slow steady improvement. She was eventually discharged home. The patient is able to walk with a walker still has some residual left-sided weakness which is old from her previous stroke. She however complains of feeling tired and having a lot of fatigue as well as dizziness and the family feels is related to is her seizure medications. She is currently on Dilantin 100 mg at night, Keppra 1000 mg twice daily and was vimpat 100 twice daily. She has no prior history of preceding seizures. She has not had any recurrent seizure since she left the hospital. Update 09/17/2013 : She returns for followup of the last visit 6 months ago. She missed her followup appointment in between. She states that stopping the Dilantin and decreasing the Keppra to 500 twice daily has helped her symptoms. She is no longer dizzy or sleepy and has more energy. She has  been more active and is almost back to her prior baseline. She continues to have mild left-sided weakness but this is old from her previous stroke. She is currently taking 1 tab 100 twice daily and Keppra 500 twice daily and tolerating both medications well without any side effects. She has no new complaints today. ROS:   14 system review of systems is positive for  constipation and ear itch only and all other systems negative  PMH:  Past Medical History  Diagnosis Date  . MI (myocardial infarction)   . Hypertension   . Stroke   . Congestive heart failure (CHF)     EF 35% Nov 2014  . Epilepsy   . Smoker     Social History:  History   Social History  . Marital Status: Single    Spouse Name: N/A    Number of Children: 3  . Years of Education: N/A   Occupational History  . Not on file.   Social History Main Topics  . Smoking status: Current Some Day Smoker    Types: Cigarettes  . Smokeless tobacco: Not on file  . Alcohol Use: Yes     Comment: occasional  . Drug Use: No  . Sexual Activity: Not on file   Other Topics Concern  . Not on file   Social History Narrative  . No narrative on file    Medications:   Current Outpatient Prescriptions on File Prior to Visit  Medication Sig Dispense Refill  . aspirin 325 MG tablet Take 325 mg by mouth every morning.       . carvedilol (COREG) 3.125 MG tablet Take 1  tablet (3.125 mg total) by mouth 2 (two) times daily with a meal.  60 tablet  1  . furosemide (LASIX) 20 MG tablet Take 3 tablets (60 mg total) by mouth daily.  90 tablet  1  . levETIRAcetam (KEPPRA) 500 MG tablet Take 1 tablet (500 mg total) by mouth 2 (two) times daily.  60 tablet  5   No current facility-administered medications on file prior to visit.    Allergies:  No Known Allergies  Physical Exam General: frail elderly african amercian aldy, seated, in no evident distress Head: head normocephalic and atraumatic.   Neck: supple with no carotid or  supraclavicular bruits Cardiovascular: regular rate and rhythm, no murmurs Musculoskeletal: no deformity Skin:  no rash/petichiae Vascular:  Normal pulses all extremities Filed Vitals:   09/17/13 1049  BP: 129/80  Pulse: 76    Neurologic Exam Mental Status: Awake and fully alert. Oriented to place and time. Recent and remote memory intact. Attention span, concentration and fund of knowledge appropriate. Mood and affect appropriate.  Cranial Nerves: Fundoscopic exam reveals sharp disc margins. Pupils equal, briskly reactive to light. Extraocular movements full without nystagmus. Visual fields full to confrontation. Hearing intact. Facial sensation intact. Face, tongue, palate moves normally and symmetrically.  Motor: Mild  left grip weakness. Diminished fine finger movements on the left. Orbits right over left upper extremity. Mild left hip flexor ankle dorsiflexor weakness. Increased tone on the left side. Reflexes are brisk on the left compared to the right side Sensory.: intact to touch and pinprick and vibratory sensation.  Coordination: Rapid alternating movements normal in all extremities. Finger-to-nose and heel-to-shin performed accurately bilaterally. Gait and Station: Arises from chair without difficulty. Stance is normal. Gait demonstrates mild circumduction on the left and uses a  Reflexes: 2+ and asymmetric brisker on the left. Toes downgoing.     ASSESSMENT: 78 year lady with symptomatic seizures in November 2014 from subcortical infarct. She is having side effects from being on 3 anticonvulsants. 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 I recommend continuing Keppra 500 mg twice daily and Vimpat 100 twice daily as she seems to be tolerating them well without any significant  side effects. Continue aspirin for stroke prevention and I counseled her to quit smoking completely. She was advised now to drive as she has been seizure free more than 6 months but advised to limit her driving to day time and short distances only.. Return for followup in 6 months with Heide Guile, NP.   Note: This document was prepared with digital dictation and possible smart phrase technology. Any transcriptional errors that result from this process are unintentional.

## 2013-10-15 ENCOUNTER — Other Ambulatory Visit: Payer: Self-pay

## 2013-10-16 MED ORDER — LACOSAMIDE 100 MG PO TABS: 100.0000 mg | ORAL_TABLET | Freq: Two times a day (BID) | ORAL | Status: DC

## 2013-12-19 ENCOUNTER — Encounter: Payer: Self-pay | Admitting: Neurology

## 2014-02-03 DIAGNOSIS — D631 Anemia in chronic kidney disease: Secondary | ICD-10-CM | POA: Diagnosis not present

## 2014-02-03 DIAGNOSIS — N189 Chronic kidney disease, unspecified: Secondary | ICD-10-CM | POA: Diagnosis not present

## 2014-02-03 DIAGNOSIS — I129 Hypertensive chronic kidney disease with stage 1 through stage 4 chronic kidney disease, or unspecified chronic kidney disease: Secondary | ICD-10-CM | POA: Diagnosis not present

## 2014-02-03 DIAGNOSIS — N183 Chronic kidney disease, stage 3 (moderate): Secondary | ICD-10-CM | POA: Diagnosis not present

## 2014-02-03 DIAGNOSIS — N2581 Secondary hyperparathyroidism of renal origin: Secondary | ICD-10-CM | POA: Diagnosis not present

## 2014-02-18 DIAGNOSIS — N183 Chronic kidney disease, stage 3 (moderate): Secondary | ICD-10-CM | POA: Diagnosis not present

## 2014-02-18 DIAGNOSIS — N2581 Secondary hyperparathyroidism of renal origin: Secondary | ICD-10-CM | POA: Diagnosis not present

## 2014-03-05 ENCOUNTER — Inpatient Hospital Stay (HOSPITAL_COMMUNITY)
Admission: EM | Admit: 2014-03-05 | Discharge: 2014-03-09 | DRG: 101 | Disposition: A | Discharge status: Home Health | Payer: Commercial Managed Care - HMO | Attending: Internal Medicine | Admitting: Internal Medicine

## 2014-03-05 ENCOUNTER — Inpatient Hospital Stay (HOSPITAL_COMMUNITY): Payer: Commercial Managed Care - HMO

## 2014-03-05 ENCOUNTER — Emergency Department (HOSPITAL_COMMUNITY)
Admission: EM | Admit: 2014-03-05 | Discharge: 2014-03-05 | Disposition: A | Discharge status: Home / Self Care | Payer: Commercial Managed Care - HMO | Source: Home / Self Care | Attending: Emergency Medicine | Admitting: Emergency Medicine

## 2014-03-05 ENCOUNTER — Emergency Department (HOSPITAL_COMMUNITY): Payer: Commercial Managed Care - HMO

## 2014-03-05 ENCOUNTER — Encounter (HOSPITAL_COMMUNITY): Payer: Self-pay

## 2014-03-05 ENCOUNTER — Encounter (HOSPITAL_COMMUNITY): Payer: Self-pay | Admitting: Emergency Medicine

## 2014-03-05 DIAGNOSIS — F17210 Nicotine dependence, cigarettes, uncomplicated: Secondary | ICD-10-CM | POA: Diagnosis not present

## 2014-03-05 DIAGNOSIS — J069 Acute upper respiratory infection, unspecified: Secondary | ICD-10-CM | POA: Diagnosis present

## 2014-03-05 DIAGNOSIS — F17200 Nicotine dependence, unspecified, uncomplicated: Secondary | ICD-10-CM | POA: Diagnosis present

## 2014-03-05 DIAGNOSIS — R270 Ataxia, unspecified: Secondary | ICD-10-CM

## 2014-03-05 DIAGNOSIS — G40909 Epilepsy, unspecified, not intractable, without status epilepticus: Principal | ICD-10-CM | POA: Diagnosis present

## 2014-03-05 DIAGNOSIS — G9340 Encephalopathy, unspecified: Secondary | ICD-10-CM | POA: Diagnosis not present

## 2014-03-05 DIAGNOSIS — R0781 Pleurodynia: Secondary | ICD-10-CM | POA: Diagnosis not present

## 2014-03-05 DIAGNOSIS — I69393 Ataxia following cerebral infarction: Secondary | ICD-10-CM | POA: Diagnosis present

## 2014-03-05 DIAGNOSIS — N183 Chronic kidney disease, stage 3 (moderate): Secondary | ICD-10-CM | POA: Diagnosis present

## 2014-03-05 DIAGNOSIS — N390 Urinary tract infection, site not specified: Secondary | ICD-10-CM | POA: Diagnosis present

## 2014-03-05 DIAGNOSIS — S299XXA Unspecified injury of thorax, initial encounter: Secondary | ICD-10-CM | POA: Diagnosis not present

## 2014-03-05 DIAGNOSIS — S4992XA Unspecified injury of left shoulder and upper arm, initial encounter: Secondary | ICD-10-CM | POA: Diagnosis not present

## 2014-03-05 DIAGNOSIS — I639 Cerebral infarction, unspecified: Secondary | ICD-10-CM

## 2014-03-05 DIAGNOSIS — R404 Transient alteration of awareness: Secondary | ICD-10-CM | POA: Diagnosis not present

## 2014-03-05 DIAGNOSIS — I10 Essential (primary) hypertension: Secondary | ICD-10-CM

## 2014-03-05 DIAGNOSIS — I69993 Ataxia following unspecified cerebrovascular disease: Secondary | ICD-10-CM

## 2014-03-05 DIAGNOSIS — M6282 Rhabdomyolysis: Secondary | ICD-10-CM | POA: Diagnosis present

## 2014-03-05 DIAGNOSIS — R109 Unspecified abdominal pain: Secondary | ICD-10-CM

## 2014-03-05 DIAGNOSIS — N179 Acute kidney failure, unspecified: Secondary | ICD-10-CM | POA: Diagnosis not present

## 2014-03-05 DIAGNOSIS — I129 Hypertensive chronic kidney disease with stage 1 through stage 4 chronic kidney disease, or unspecified chronic kidney disease: Secondary | ICD-10-CM | POA: Diagnosis present

## 2014-03-05 DIAGNOSIS — Z7982 Long term (current) use of aspirin: Secondary | ICD-10-CM | POA: Diagnosis not present

## 2014-03-05 DIAGNOSIS — R82998 Other abnormal findings in urine: Secondary | ICD-10-CM

## 2014-03-05 DIAGNOSIS — Z79899 Other long term (current) drug therapy: Secondary | ICD-10-CM

## 2014-03-05 DIAGNOSIS — S0990XA Unspecified injury of head, initial encounter: Secondary | ICD-10-CM | POA: Diagnosis not present

## 2014-03-05 DIAGNOSIS — D649 Anemia, unspecified: Secondary | ICD-10-CM | POA: Diagnosis present

## 2014-03-05 DIAGNOSIS — I252 Old myocardial infarction: Secondary | ICD-10-CM

## 2014-03-05 DIAGNOSIS — Z720 Tobacco use: Secondary | ICD-10-CM | POA: Diagnosis not present

## 2014-03-05 DIAGNOSIS — R0789 Other chest pain: Secondary | ICD-10-CM

## 2014-03-05 DIAGNOSIS — R569 Unspecified convulsions: Secondary | ICD-10-CM

## 2014-03-05 DIAGNOSIS — E875 Hyperkalemia: Secondary | ICD-10-CM | POA: Diagnosis present

## 2014-03-05 DIAGNOSIS — M25511 Pain in right shoulder: Secondary | ICD-10-CM

## 2014-03-05 DIAGNOSIS — M25512 Pain in left shoulder: Secondary | ICD-10-CM | POA: Diagnosis not present

## 2014-03-05 DIAGNOSIS — W19XXXA Unspecified fall, initial encounter: Secondary | ICD-10-CM

## 2014-03-05 DIAGNOSIS — R000 Tachycardia, unspecified: Secondary | ICD-10-CM | POA: Diagnosis present

## 2014-03-05 DIAGNOSIS — I5042 Chronic combined systolic (congestive) and diastolic (congestive) heart failure: Secondary | ICD-10-CM | POA: Diagnosis not present

## 2014-03-05 DIAGNOSIS — I1310 Hypertensive heart and chronic kidney disease without heart failure, with stage 1 through stage 4 chronic kidney disease, or unspecified chronic kidney disease: Secondary | ICD-10-CM | POA: Diagnosis present

## 2014-03-05 DIAGNOSIS — Z8673 Personal history of transient ischemic attack (TIA), and cerebral infarction without residual deficits: Secondary | ICD-10-CM | POA: Diagnosis present

## 2014-03-05 DIAGNOSIS — R55 Syncope and collapse: Secondary | ICD-10-CM | POA: Diagnosis not present

## 2014-03-05 LAB — I-STAT ARTERIAL BLOOD GAS, ED
Acid-base deficit: 7 mmol/L — ABNORMAL HIGH (ref 0.0–2.0)
Bicarbonate: 17.5 mEq/L — ABNORMAL LOW (ref 20.0–24.0)
O2 Saturation: 97 %
TCO2: 18 mmol/L (ref 0–100)
pCO2 arterial: 32.4 mmHg — ABNORMAL LOW (ref 35.0–45.0)
pH, Arterial: 7.341 — ABNORMAL LOW (ref 7.350–7.450)
pO2, Arterial: 98 mmHg (ref 80.0–100.0)

## 2014-03-05 LAB — HEPATIC FUNCTION PANEL
ALT: 27 U/L (ref 0–35)
AST: 33 U/L (ref 0–37)
Albumin: 3.8 g/dL (ref 3.5–5.2)
Alkaline Phosphatase: 76 U/L (ref 39–117)
Bilirubin, Direct: <0.1 mg/dL (ref 0.0–0.5)
Total Bilirubin: 0.3 mg/dL (ref 0.3–1.2)
Total Protein: 7.6 g/dL (ref 6.0–8.3)

## 2014-03-05 LAB — URINE MICROSCOPIC-ADD ON: RBC / HPF: NONE SEEN RBC/hpf (ref <3)

## 2014-03-05 LAB — URINALYSIS, ROUTINE W REFLEX MICROSCOPIC
Bilirubin Urine: NEGATIVE
Glucose, UA: NEGATIVE mg/dL
Ketones, ur: NEGATIVE mg/dL
Nitrite: NEGATIVE
Protein, ur: 30 mg/dL — AB
Specific Gravity, Urine: 1.016 (ref 1.005–1.030)
Urobilinogen, UA: 0.2 mg/dL (ref 0.0–1.0)
pH: 5 (ref 5.0–8.0)

## 2014-03-05 LAB — BASIC METABOLIC PANEL
Anion gap: 9 (ref 5–15)
Anion gap: 21 — ABNORMAL HIGH (ref 5–15)
BUN: 44 mg/dL — ABNORMAL HIGH (ref 6–23)
BUN: 38 mg/dL — ABNORMAL HIGH (ref 6–23)
CO2: 22 mmol/L (ref 19–32)
CO2: 11 mmol/L — ABNORMAL LOW (ref 19–32)
Calcium: 9.8 mg/dL (ref 8.4–10.5)
Calcium: 9.6 mg/dL (ref 8.4–10.5)
Chloride: 110 mmol/L (ref 96–112)
Chloride: 110 mmol/L (ref 96–112)
Creatinine, Ser: 1.64 mg/dL — ABNORMAL HIGH (ref 0.50–1.10)
Creatinine, Ser: 1.58 mg/dL — ABNORMAL HIGH (ref 0.50–1.10)
GFR calc Af Amer: 35 mL/min — ABNORMAL LOW (ref >90)
GFR calc Af Amer: 37 mL/min — ABNORMAL LOW (ref >90)
GFR calc non Af Amer: 30 mL/min — ABNORMAL LOW (ref >90)
GFR calc non Af Amer: 32 mL/min — ABNORMAL LOW (ref >90)
Glucose, Bld: 107 mg/dL — ABNORMAL HIGH (ref 70–99)
Glucose, Bld: 171 mg/dL — ABNORMAL HIGH (ref 70–99)
Potassium: 5.5 mmol/L — ABNORMAL HIGH (ref 3.5–5.1)
Potassium: 4.1 mmol/L (ref 3.5–5.1)
Sodium: 141 mmol/L (ref 135–145)
Sodium: 142 mmol/L (ref 135–145)

## 2014-03-05 LAB — RAPID URINE DRUG SCREEN, HOSP PERFORMED
Amphetamines: NOT DETECTED
Barbiturates: NOT DETECTED
Benzodiazepines: NOT DETECTED
Cocaine: NOT DETECTED
Opiates: NOT DETECTED
Tetrahydrocannabinol: NOT DETECTED

## 2014-03-05 LAB — CBC WITH DIFFERENTIAL/PLATELET
Basophils Absolute: 0 10*3/uL (ref 0.0–0.1)
Basophils Relative: 0 % (ref 0–1)
Eosinophils Absolute: 0.1 10*3/uL (ref 0.0–0.7)
Eosinophils Relative: 1 % (ref 0–5)
HCT: 34.6 % — ABNORMAL LOW (ref 36.0–46.0)
Hemoglobin: 11.7 g/dL — ABNORMAL LOW (ref 12.0–15.0)
Lymphocytes Relative: 7 % — ABNORMAL LOW (ref 12–46)
Lymphs Abs: 0.7 10*3/uL (ref 0.7–4.0)
MCH: 32.3 pg (ref 26.0–34.0)
MCHC: 33.8 g/dL (ref 30.0–36.0)
MCV: 95.6 fL (ref 78.0–100.0)
Monocytes Absolute: 0.9 10*3/uL (ref 0.1–1.0)
Monocytes Relative: 9 % (ref 3–12)
Neutro Abs: 8.4 10*3/uL — ABNORMAL HIGH (ref 1.7–7.7)
Neutrophils Relative %: 83 % — ABNORMAL HIGH (ref 43–77)
Platelets: 246 10*3/uL (ref 150–400)
RBC: 3.62 MIL/uL — ABNORMAL LOW (ref 3.87–5.11)
RDW: 15 % (ref 11.5–15.5)
WBC: 10.1 10*3/uL (ref 4.0–10.5)

## 2014-03-05 LAB — ETHANOL: Alcohol, Ethyl (B): <5 mg/dL (ref 0–9)

## 2014-03-05 LAB — I-STAT CG4 LACTIC ACID, ED: Lactic Acid, Venous: 2.63 mmol/L (ref 0.5–2.0)

## 2014-03-05 MED ORDER — LORAZEPAM 2 MG/ML IJ SOLN
1.0000 mg | Freq: Once | INTRAMUSCULAR | Status: AC
  Administered 2014-03-05: 1 mg via INTRAVENOUS

## 2014-03-05 MED ORDER — LEVETIRACETAM IN NACL 1000 MG/100ML IV SOLN
1000.0000 mg | Freq: Once | INTRAVENOUS | Status: AC
  Administered 2014-03-05: 1000 mg via INTRAVENOUS
  Filled 2014-03-05: qty 100

## 2014-03-05 MED ORDER — DEXTROSE 5 % IV SOLN
1.0000 g | Freq: Once | INTRAVENOUS | Status: AC
  Administered 2014-03-05: 1 g via INTRAVENOUS
  Filled 2014-03-05: qty 10

## 2014-03-05 MED ORDER — LORAZEPAM 2 MG/ML IJ SOLN
INTRAMUSCULAR | Status: AC
  Filled 2014-03-05: qty 1

## 2014-03-05 MED ORDER — CARVEDILOL 3.125 MG PO TABS
3.1250 mg | ORAL_TABLET | Freq: Two times a day (BID) | ORAL | Status: DC
  Administered 2014-03-06 – 2014-03-09 (×7): 3.125 mg via ORAL
  Filled 2014-03-05 (×8): qty 1

## 2014-03-05 MED ORDER — HEPARIN SODIUM (PORCINE) 5000 UNIT/ML IJ SOLN
5000.0000 [IU] | Freq: Three times a day (TID) | INTRAMUSCULAR | Status: DC
  Administered 2014-03-05 – 2014-03-09 (×11): 5000 [IU] via SUBCUTANEOUS
  Filled 2014-03-05 (×12): qty 1

## 2014-03-05 MED ORDER — SODIUM CHLORIDE 0.9 % IV BOLUS (SEPSIS)
1000.0000 mL | Freq: Once | INTRAVENOUS | Status: AC
  Administered 2014-03-05: 1000 mL via INTRAVENOUS

## 2014-03-05 MED ORDER — TRAMADOL HCL 50 MG PO TABS: 50.0000 mg | ORAL_TABLET | Freq: Four times a day (QID) | ORAL | Status: DC | PRN

## 2014-03-05 MED ORDER — ASPIRIN 325 MG PO TABS
325.0000 mg | ORAL_TABLET | Freq: Every morning | ORAL | Status: DC
  Administered 2014-03-06 – 2014-03-09 (×4): 325 mg via ORAL
  Filled 2014-03-05 (×4): qty 1

## 2014-03-05 MED ORDER — LEVETIRACETAM IN NACL 750 MG/50ML IV SOLN
750.0000 mg | Freq: Two times a day (BID) | INTRAVENOUS | Status: DC
  Administered 2014-03-06 – 2014-03-07 (×4): 750 mg via INTRAVENOUS
  Filled 2014-03-05 (×7): qty 50

## 2014-03-05 MED ORDER — OXYCODONE-ACETAMINOPHEN 5-325 MG PO TABS
1.0000 | ORAL_TABLET | Freq: Once | ORAL | Status: AC
  Administered 2014-03-05: 1 via ORAL
  Filled 2014-03-05: qty 1

## 2014-03-05 MED ORDER — SODIUM CHLORIDE 0.9 % IV SOLN
INTRAVENOUS | Status: DC
  Administered 2014-03-05: 23:00:00 via INTRAVENOUS

## 2014-03-05 MED ORDER — METOPROLOL TARTRATE 1 MG/ML IV SOLN
5.0000 mg | Freq: Once | INTRAVENOUS | Status: AC
  Administered 2014-03-05: 5 mg via INTRAVENOUS
  Filled 2014-03-05: qty 5

## 2014-03-05 MED ORDER — CEPHALEXIN 500 MG PO CAPS: 500.0000 mg | ORAL_CAPSULE | Freq: Four times a day (QID) | ORAL | Status: DC

## 2014-03-05 MED ORDER — SODIUM CHLORIDE 0.9 % IV SOLN
100.0000 mg | Freq: Two times a day (BID) | INTRAVENOUS | Status: DC
  Administered 2014-03-05 – 2014-03-07 (×5): 100 mg via INTRAVENOUS
  Filled 2014-03-05 (×9): qty 10

## 2014-03-05 NOTE — ED Notes (Signed)
Pt still only withdrawing from pain. Pt otherwise unresponsive

## 2014-03-05 NOTE — ED Notes (Addendum)
Radiology called and stated that pt was having seizure like activity

## 2014-03-05 NOTE — ED Notes (Signed)
Pt escorted back to room on stretcher, pt is responsive to pain only

## 2014-03-05 NOTE — ED Notes (Signed)
Per EMS, pt fell off bed this morning and could not remember falling, pt woke up laying on the floor. Pt has pain in her left side around her flank. Pt in no acute distress on arrival and no deformities noted. Pt stated that she did not have any pain in her head and no blurred vision.

## 2014-03-05 NOTE — ED Notes (Signed)
MD made aware that pt was having seizure activity that lasted a minute...Marland KitchenMarland Kitchenthe patient back from x-ray, VSS, GCS 10, IV inserted. Shanna Cisco

## 2014-03-05 NOTE — ED Provider Notes (Signed)
CSN: [id number]     Arrival date & time 03/05/14  1813 History   First MD Initiated Contact with Patient 03/05/14 1815     Chief Complaint  Patient presents with  . Seizures     (Consider location/radiation/quality/duration/timing/severity/associated sxs/prior Treatment) HPI  74 year old female with past history of seizures, CAD, stroke, CHF, hypertension who was seen earlier today in this ED for fall after having seizure. Per family report patient had been taking her meds as instructed and had been in her usual state of health. No report of recent illness. During that visit patient had a workup consistent with screening labs showing UTI and patient also had a head CT and chest x-ray which were unremarkable. Patient was noted to have seizure-like activity while in the ED for which she was treated and reported to return to baseline.  Patient was treated for UTI and subsequently discharged. While patient was out in the waiting room she was reported by family to have another seizure lasting 1-2 minutes which was typical for her. Patient is unable to provide history at this point secondary to her condition.   Past Medical History  Diagnosis Date  . MI (myocardial infarction)   . Hypertension   . Stroke   . Congestive heart failure (CHF)     EF 35% Nov 2014  . Epilepsy   . Smoker    History reviewed. No pertinent past surgical history. Family History  Problem Relation Age of Onset  . Cancer Father   . Anemia Daughter    History  Substance Use Topics  . Smoking status: Current Some Day Smoker    Types: Cigarettes  . Smokeless tobacco: Not on file  . Alcohol Use: Yes     Comment: occasional   OB History    No data available     Review of Systems Unable to obtain secondary to patient condition   Allergies  Review of patient's allergies indicates no known allergies.  Home Medications   Prior to Admission medications   Medication Sig Start Date End Date Taking? Authorizing  Provider  aspirin 325 MG tablet Take 325 mg by mouth every morning.     Historical Provider, MD  calcitRIOL (ROCALTROL) 0.25 MCG capsule Take 0.25 mcg by mouth daily.    Historical Provider, MD  carvedilol (COREG) 3.125 MG tablet Take 1 tablet (3.125 mg total) by mouth 2 (two) times daily with a meal. 01/15/13   Evlyn Kanner Love, PA-C  cephALEXin (KEFLEX) 500 MG capsule Take 1 capsule (500 mg total) by mouth 4 (four) times daily. 03/05/14   Raeford Razor, MD  furosemide (LASIX) 20 MG tablet Take 3 tablets (60 mg total) by mouth daily. Patient not taking: Reported on 03/05/2014 02/28/13   Vassie Loll, MD  Lacosamide (VIMPAT) 100 MG TABS Take 1 tablet (100 mg total) by mouth 2 (two) times daily. 10/16/13   Delia Heady, MD  levETIRAcetam (KEPPRA) 500 MG tablet Take 1 tablet (500 mg total) by mouth 2 (two) times daily. 06/13/13   Delia Heady, MD  traMADol (ULTRAM) 50 MG tablet Take 1 tablet (50 mg total) by mouth every 6 (six) hours as needed. 03/05/14   Raeford Razor, MD   BP 150/94 mmHg  Pulse 98  Temp(Src) 98.3 F (36.8 C) (Oral)  Resp 16  Ht 5' (1.524 m)  Wt 80 lb (36.288 kg)  BMI 15.62 kg/m2  SpO2 100% Physical Exam  Constitutional: She appears well-developed and well-nourished. She appears listless. She is uncooperative. No distress.  HENT:  Head: Normocephalic and atraumatic.  Eyes: Conjunctivae are normal.  Neck: Normal range of motion.  Cardiovascular: Normal rate, regular rhythm, normal heart sounds and intact distal pulses.   No murmur heard. Pulmonary/Chest: Effort normal and breath sounds normal. No respiratory distress. She has no wheezes. She has no rales. She exhibits no tenderness.  Abdominal: Soft. Bowel sounds are normal. She exhibits no distension.  Musculoskeletal: Normal range of motion.  Neurological: She appears listless.  HDS, arousable to pain. PERRL. Pt postictal and further exam deferred.   Skin: Skin is warm and dry.  Psychiatric: She has a normal mood and affect.   Nursing note and vitals reviewed.   ED Course  Procedures (including critical care time) Labs Review Labs Reviewed  BASIC METABOLIC PANEL - Abnormal; Notable for the following:    CO2 11 (*)    Glucose, Bld 171 (*)    BUN 38 (*)    Creatinine, Ser 1.58 (*)    GFR calc non Af Amer 32 (*)    GFR calc Af Amer 37 (*)    Anion gap 21 (*)    All other components within normal limits  I-STAT CG4 LACTIC ACID, ED    Imaging Review Dg Ribs Unilateral W/chest Right  03/05/2014   CLINICAL DATA:  Right chest pain. Fall. Posterior right lower rib pain.  EXAM: RIGHT RIBS AND CHEST - 3+ VIEW  COMPARISON:  Chest x-ray 02/25/2013  FINDINGS: Heart is mildly enlarged. Lungs are clear. No effusions or pneumothorax. No acute bony abnormality. No visible rib fracture.  IMPRESSION: No acute findings.   Electronically Signed   By: Charlett Nose M.D.   On: 03/05/2014 14:25   Ct Head Wo Contrast  03/05/2014   CLINICAL DATA:  74 year old female found down. Fall, seizure. Initial encounter.  EXAM: CT HEAD WITHOUT CONTRAST  TECHNIQUE: Contiguous axial images were obtained from the base of the skull through the vertex without intravenous contrast.  COMPARISON:  Head CT without contrast 02/11/2013 and earlier.  FINDINGS: Chronic sphenoid sinusitis. Stable paranasal sinuses and mastoids. No acute osseous abnormality identified.  No acute orbit or scalp soft tissue finding identified.  Calcified atherosclerosis at the skull base. Cerebral volume not significantly changed. Chronic white matter hypodensity, maximal at the right frontal horn. No midline shift, mass effect, or evidence of intracranial mass lesion. No ventriculomegaly. No midline shift, mass effect, or evidence of intracranial mass lesion. Dural calcifications. No acute intracranial hemorrhage identified. No evidence of cortically based acute infarction identified. No suspicious intracranial vascular hyperdensity.  IMPRESSION: No acute intracranial abnormality.  Chronic white matter changes likely due to small vessel ischemia. Chronic sphenoid sinusitis.   Electronically Signed   By: Augusto Gamble M.D.   On: 03/05/2014 15:39     EKG Interpretation None      MDM   Final diagnoses:  None    Nyrobi Harnois is a 74 y.o. female with H&P as above. Patient moved to trauma bay C from lobby following d/c after having another seizure. On my arrival patient is post ictal with vital signs notable for mild tachycardia to the 110s and normal blood pressure. Patient initially only responsive to pain. Patient appears to be protecting her airway well. On repeat eval, pt opens eyes to pain, speaking, moving al 4 extremities. Repeat BMP, ABG, lactate sent at this time. Patient appears to have lactic acidosis. Discussed case with neurology given recurrent seizures. They advise loaded with Keppra and admit for observation.   Pt  seen in conjunction with Dr. Suzi Roots, MD  Ames Dura, DO [facility] Emergency Medicine Resident - PGY-2     Ames Dura, MD 03/06/14 1610  Suzi Roots, MD 03/07/14 [phone]

## 2014-03-05 NOTE — ED Notes (Signed)
Pt squeezing hand when commanded to.

## 2014-03-05 NOTE — ED Notes (Signed)
Placed pt on non rebreather 

## 2014-03-05 NOTE — ED Notes (Signed)
Pt placed on bedpan

## 2014-03-05 NOTE — ED Notes (Signed)
Patient transported to X-ray 

## 2014-03-05 NOTE — ED Notes (Signed)
MD in room.....stated that he does not want an IV.

## 2014-03-05 NOTE — ED Notes (Signed)
Padded side rails (ED stretcher only); Oxygen set-up at bedside; Suction set-up at bedside; Airway set-up throughout entire visit.

## 2014-03-05 NOTE — Consult Note (Signed)
Reason for Consult: Multiple seizures.  HPI:                                                                                                                                          [name] is an 74 y.o. female with a history of bilateral cerebral infarctions, congestive heart failure, seizure disorder, myocardial infarction and hypertension who was brought to the emergency room following a recurrent seizure earlier today. She was found to have urinary tract infection and antibiotic as prescribed. She's been taking Keppra 500 mg twice a day and Vimpat 100 mg twice a day. No changes were made in nature medications. The patient was getting dressed to leave when she had another witnessed seizure which started as a right focal motor seizure, then progress to generalization. Patient has remained lethargic. CT scan of her head showed no acute intracranial abnormality. She was given a loading dose of Keppra 1000 mg IV. No further seizure activity has occurred.   Past Medical History  Diagnosis Date  . MI (myocardial infarction)   . Hypertension   . Stroke   . Congestive heart failure (CHF)     EF 35% Nov 2014  . Epilepsy   . Smoker     History reviewed. No pertinent past surgical history.  Family History  Problem Relation Age of Onset  . Cancer Father   . Anemia Daughter     Social History:  reports that she has been smoking Cigarettes.  She does not have any smokeless tobacco history on file. She reports that she drinks alcohol. She reports that she does not use illicit drugs.  No Known Allergies  MEDICATIONS:                                                                                                                     I have reviewed the patient's current medications.   ROS:  History obtained from unobtainable from patient due to mental  status  Blood pressure 150/94, pulse 102, temperature 98.3 F (36.8 C), temperature source Oral, resp. rate 14, height 5' (1.524 m), weight 36.288 kg (80 lb), SpO2 98 %. Appearance was that of a slender elderly lady who was depressed but could be aroused briefly. HEENT: Unremarkable Neck was supple with full range of motion. Heart rate and rhythm were normal. Extremities were normal in appearance with no edema.  Neurologic Examination:                                                                                                      Respiratory pattern was normal. Patient was responsive to verbal and tactile simulation but only briefly area she had no verbal output. She was able to follow a few simple commands with use of her left upper extremity. Goals were equal and reacted normally to light. Extraocular movements were intact and conjugate with right left lateral gaze. The facial weakness was noted. Muscle tone was flaccid throughout. Patient was able to follow commands minimally with use of upper extremity; she had no involuntary movement of right upper extremity nor her extremities. Muscle tone was flaccid throughout. Deep tendon reflexes were plus and symmetrical. Plantar responses were mute bilaterally.  Lab Results  Component Value Date/Time   CHOL 145 02/12/2013 03:00 AM    Results for orders placed or performed during the hospital encounter of 03/05/14 (from the past 48 hour(s))  Basic metabolic panel     Status: Abnormal   Collection Time: 03/05/14  6:11 PM  Result Value Ref Range   Sodium 142 135 - 145 mmol/L   Potassium 4.1 3.5 - 5.1 mmol/L   Chloride 110 96 - 112 mmol/L   CO2 11 (L) 19 - 32 mmol/L   Glucose, Bld 171 (H) 70 - 99 mg/dL   BUN 38 (H) 6 - 23 mg/dL   Creatinine, Ser 1.58 (H) 0.50 - 1.10 mg/dL   Calcium 9.6 8.4 - 10.5 mg/dL   GFR calc non Af Amer 32 (L) >90 mL/min   GFR calc Af Amer 37 (L) >90 mL/min    Comment: (NOTE) The eGFR has been calculated  using the CKD EPI equation. This calculation has not been validated in all clinical situations. eGFR's persistently <90 mL/min signify possible Chronic Kidney Disease.    Anion gap 21 (H) 5 - 15    Dg Ribs Unilateral W/chest Right  03/05/2014   CLINICAL DATA:  Right chest pain. Fall. Posterior right lower rib pain.  EXAM: RIGHT RIBS AND CHEST - 3+ VIEW  COMPARISON:  Chest x-ray 02/25/2013  FINDINGS: Heart is mildly enlarged. Lungs are clear. No effusions or pneumothorax. No acute bony abnormality. No visible rib fracture.  IMPRESSION: No acute findings.   Electronically Signed   By: Rolm Baptise M.D.   On: 03/05/2014 14:25   Ct Head Wo Contrast  03/05/2014   CLINICAL DATA:  74 year old female found down. Fall, seizure. Initial encounter.  EXAM: CT HEAD WITHOUT CONTRAST  TECHNIQUE: Contiguous axial images were obtained from  the base of the skull through the vertex without intravenous contrast.  COMPARISON:  Head CT without contrast 02/11/2013 and earlier.  FINDINGS: Chronic sphenoid sinusitis. Stable paranasal sinuses and mastoids. No acute osseous abnormality identified.  No acute orbit or scalp soft tissue finding identified.  Calcified atherosclerosis at the skull base. Cerebral volume not significantly changed. Chronic white matter hypodensity, maximal at the right frontal horn. No midline shift, mass effect, or evidence of intracranial mass lesion. No ventriculomegaly. No midline shift, mass effect, or evidence of intracranial mass lesion. Dural calcifications. No acute intracranial hemorrhage identified. No evidence of cortically based acute infarction identified. No suspicious intracranial vascular hyperdensity.  IMPRESSION: No acute intracranial abnormality. Chronic white matter changes likely due to small vessel ischemia. Chronic sphenoid sinusitis.   Electronically Signed   By: Lars Pinks M.D.   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 Recurrent stroke will need to be ruled out however if asymmetry of involuntary movement persists.  Recommendations: 1. Increase Keppra maintenance dose to 750 mg every 12 hours. 2. No change in Vimpat, 100 mg twice a day. 3. MRI of the brain without contrast to rule out recurrent stroke if patient's apparent right-sided weakness persists.  We will continue to follow this patient with you.  C.R. Nicole Kindred, MD [facility] [phone]  03/05/2014, 8:16 PM

## 2014-03-05 NOTE — H&P (Signed)
[facility] History and Physical  Patient: [name]  MRN: [id number]  DOB: Mar 28, 1940  DOS: the patient was seen and examined on 03/05/2014 PCP: [name], MD  Chief Complaint: Seizure-like episode  HPI: [name] is a 74 y.o. female with Past medical history of CVA, hypertension, chronic combined CHF, epilepsy, active smoker. The patient is presenting with seizure-like episode. Patient was at her baseline until today. Today the patient fell out of the bed this morning and was not able to remember the event. Patient was on the floor and does not remember how long she has been on the floor. Patient was complaining of some pain on her left side around the flank. As per the family friend was at the bedside who is also the primary caregiver for the patient the patient has been at her baseline and has been compliant with all her medication. Does not drink any significant alcohol. Does not abuse any drugs. Actively smoking. Was not complaining of any acute etiology. While the patient was in the x-ray she had an episode of seizure-like activity after which patient quickly returned to baseline and did not have any postictal state. Patient was suspected to have a UTI and was treated with ceftriaxone and was discharged on oral Keflex. While she was in the waiting room she had an episode of seizure that lasted for 1-2 minutes. Patient was brought back to the ER. After this event the patient has remained postictal and has not been communicating significantly. Although the time of my evaluation the patient was able to respond to her name. Was able to follow command. Denying having any pain.  The patient is coming from home. And at her baseline independent for most of her ADL.  Review of Systems: as mentioned in the history of present illness.  A Comprehensive review of the other systems is negative.  Past Medical History  Diagnosis Date  . MI (myocardial infarction)   .  Hypertension   . Stroke   . Congestive heart failure (CHF)     EF 35% Nov 2014  . Epilepsy   . Smoker    History reviewed. No pertinent past surgical history. Social History:  reports that she has been smoking Cigarettes.  She does not have any smokeless tobacco history on file. She reports that she drinks alcohol. She reports that she does not use illicit drugs.  No Known Allergies  Family History  Problem Relation Age of Onset  . Cancer Father   . Anemia Daughter     Prior to Admission medications   Medication Sig Start Date End Date Taking? Authorizing Provider  aspirin 325 MG tablet Take 325 mg by mouth every morning.    Yes Historical Provider, MD  calcitRIOL (ROCALTROL) 0.25 MCG capsule Take 0.25 mcg by mouth daily.   Yes Historical Provider, MD  carvedilol (COREG) 3.125 MG tablet Take 1 tablet (3.125 mg total) by mouth 2 (two) times daily with a meal. 01/15/13  Yes Evlyn Kanner Love, PA-C  furosemide (LASIX) 20 MG tablet Take 3 tablets (60 mg total) by mouth daily. 02/28/13  Yes Vassie Loll, MD  Lacosamide (VIMPAT) 100 MG TABS Take 1 tablet (100 mg total) by mouth 2 (two) times daily. 10/16/13  Yes Delia Heady, MD  levETIRAcetam (KEPPRA) 500 MG tablet Take 1 tablet (500 mg total) by mouth 2 (two) times daily. 06/13/13  Yes Delia Heady, MD  traMADol (ULTRAM) 50 MG tablet Take 1 tablet (50 mg total) by mouth every 6 (six)  hours as needed. Patient taking differently: Take 50 mg by mouth every 6 (six) hours as needed for moderate pain.  03/05/14  Yes Raeford Razor, MD  cephALEXin (KEFLEX) 500 MG capsule Take 1 capsule (500 mg total) by mouth 4 (four) times daily. Patient not taking: Reported on 03/05/2014 03/05/14   Raeford Razor, MD    Physical Exam: Filed Vitals:   03/05/14 2011 03/05/14 2118 03/05/14 2130 03/05/14 2205  BP:  154/104 150/96 143/92  Pulse: 102 114 97 108  Temp:    98.3 F (36.8 C)  TempSrc:    Oral  Resp: Height:    5' (1.524 m)  Weight:    37.014 kg  (81 lb 9.6 oz)  SpO2: 98% 99% 100% 100%    General: Alert, Awake and Oriented to person. Appear in mild distress Eyes: PERRL ENT: Oral Mucosa clear moist. Neck: no JVD Cardiovascular: S1 and S2 Present, no Murmur, Peripheral Pulses Present Respiratory: Bilateral Air entry equal and Decreased, Clear to Auscultation, noCrackles, no wheezes Abdomen: Bowel Sound present, Soft and non tender Skin: no Rash Extremities: no Pedal edema, no calf tenderness Neurologic: Spontaneous movement of all 4 extremities, withdraws to painful stimuli bilaterally, follows command promptly. Sleepy and drowsy but arousable.  Labs on Admission:  CBC:  Recent Labs Lab 03/05/14 1547  WBC 10.1  NEUTROABS 8.4*  HGB 11.7*  HCT 34.6*  MCV 95.6  PLT 246    CMP     Component Value Date/Time   NA 142 03/05/2014 1811   K 4.1 03/05/2014 1811   CL 110 03/05/2014 1811   CO2 11* 03/05/2014 1811   GLUCOSE 171* 03/05/2014 1811   BUN 38* 03/05/2014 1811   CREATININE 1.58* 03/05/2014 1811   CALCIUM 9.6 03/05/2014 1811   PROT 7.6 03/05/2014 2254   ALBUMIN 3.8 03/05/2014 2254   AST 33 03/05/2014 2254   ALT 27 03/05/2014 2254   ALKPHOS 76 03/05/2014 2254   BILITOT 0.3 03/05/2014 2254   GFRNONAA 32* 03/05/2014 1811   GFRAA 37* 03/05/2014 1811    No results for input(s): LIPASE, AMYLASE in the last 168 hours.  No results for input(s): CKTOTAL, CKMB, CKMBINDEX, TROPONINI in the last 168 hours. BNP (last 3 results) No results for input(s): BNP in the last 8760 hours.  ProBNP (last 3 results) No results for input(s): PROBNP in the last 8760 hours.   Radiological Exams on Admission: Dg Ribs Unilateral W/chest Right  03/05/2014   CLINICAL DATA:  Right chest pain. Fall. Posterior right lower rib pain.  EXAM: RIGHT RIBS AND CHEST - 3+ VIEW  COMPARISON:  Chest x-ray 02/25/2013  FINDINGS: Heart is mildly enlarged. Lungs are clear. No effusions or pneumothorax. No acute bony abnormality. No visible rib fracture.   IMPRESSION: No acute findings.   Electronically Signed   By: Charlett Nose M.D.   On: 03/05/2014 14:25   Ct Head Wo Contrast  03/05/2014   CLINICAL DATA:  74 year old female found down. Fall, seizure. Initial encounter.  EXAM: CT HEAD WITHOUT CONTRAST  TECHNIQUE: Contiguous axial images were obtained from the base of the skull through the vertex without intravenous contrast.  COMPARISON:  Head CT without contrast 02/11/2013 and earlier.  FINDINGS: Chronic sphenoid sinusitis. Stable paranasal sinuses and mastoids. No acute osseous abnormality identified.  No acute orbit or scalp soft tissue finding identified.  Calcified atherosclerosis at the skull base. Cerebral volume not significantly changed. Chronic white matter hypodensity, maximal at the right frontal horn.  No midline shift, mass effect, or evidence of intracranial mass lesion. No ventriculomegaly. No midline shift, mass effect, or evidence of intracranial mass lesion. Dural calcifications. No acute intracranial hemorrhage identified. No evidence of cortically based acute infarction identified. No suspicious intracranial vascular hyperdensity.  IMPRESSION: No acute intracranial abnormality. Chronic white matter changes likely due to small vessel ischemia. Chronic sphenoid sinusitis.   Electronically Signed   By: Augusto Gamble M.D.   On: 03/05/2014 15:39   Dg Abd 2 Views  03/05/2014   CLINICAL DATA:  Abdominal pain. Two seizures today. History of stroke.  EXAM: ABDOMEN - 2 VIEW  COMPARISON:  Abdominal radiograph February 24, 2013  FINDINGS: The bowel gas pattern is normal. There is no evidence of free air. Phleboliths in the pelvis. No suspicious radio-opaque calculi or other significant radiographic abnormality is seen.  IMPRESSION: Normal bowel gas pattern.   Electronically Signed   By: Awilda Metro   On: 03/05/2014 22:01    EKG: Independently reviewed. normal sinus rhythm, nonspecific ST and T waves changes.  Assessment/Plan Principal Problem:    Seizure Active Problems:   Hypertension   Encephalopathy   CVA (cerebral infarction)   Ataxia, late effect of cerebrovascular disease   Smoker   Chronic combined systolic and diastolic CHF (congestive heart failure)   1. Seizure  The patient is presenting with seizure-like activity. Neurology has evaluated the patient. Recommended the patient to be loaded with Keppra and started her on a higher dose of Keppra. The patient will be admitted in the hospital on telemetry since she is hemodynamically stable. She will remain nothing by mouth. Seizure prophylaxis as well as serial neuro checks and telemetry monitoring. Continue with Keppra. Lorazepam when necessary for seizures. She did complain of some right-sided numbness initially before arrival second time. We will get an MRI depending on the evaluation in the morning.  2. Hypertension with tachycardia. Patient appears to have sinus tachycardia. UDS is clear. Gentle IV hydration. As well as using Lopressor.  3. Suspected UTI. Patient was suspected to have a UTI but did not have any UTI symptoms. Does not have any bacteriuria. At present holding off on antibiotics. She does adhere because of Crystal. We'll get an ultrasound in the morning to evaluate for stone as she was complaining of flank pain on the left.  4. Chronic kidney disease. Continue close monitoring.  5. Chronic CHF. At present does not appear volume overloaded. Continue close monitoring of ins and outs and daily weight.  Advance goals of care discussion: Full code   Consults: Neurology  DVT Prophylaxis: subcutaneous Heparin Nutrition: Nothing by mouth  Family Communication: Family friend was present at bedside, opportunity was given to ask question and all questions were answered satisfactorily at the time of interview. Disposition: Admitted to inpatient in telemetry unit.  Author: Lynden Oxford, MD [facility] Pager: [phone] 03/05/2014,  11:55 PM    If 7PM-7AM, please contact night-coverage [email or web address] Password [email or web address]

## 2014-03-05 NOTE — ED Notes (Addendum)
Attempted to call report

## 2014-03-05 NOTE — ED Notes (Signed)
Elevated CG-4 reported to Dr. Essie Christine

## 2014-03-05 NOTE — ED Notes (Signed)
Pt brought in by EMS earlier today for seizures. Pt was just discharged, but started having grand mal seizure in waiting room. Lasted approx 1 minute. Pt now post ictal.

## 2014-03-05 NOTE — ED Notes (Signed)
Pt verbalized understanding of discharge instructions and prescriptions and has no further questions.  

## 2014-03-05 NOTE — Discharge Instructions (Signed)
Epilepsy People with epilepsy have times when they shake and jerk uncontrollably (seizures). This happens when there is a sudden change in brain function. Epilepsy may have many possible causes. Anything that disturbs the normal pattern of brain cell activity can lead to seizures. HOME CARE   Follow your doctor's instructions about driving and safety during normal activities.  Get enough sleep.  Only take medicine as told by your doctor.  Avoid things that you know can cause you to have seizures (triggers).  Write down when your seizures happen and what you remember about each seizure. Write down anything you think may have caused the seizure to happen.  Tell the people you live and work with that you have seizures. Make sure they know how to help you. They should:  Cushion your head and body.  Turn you on your side.  Not restrain you.  Not place anything inside your mouth.  Call for local emergency medical help if there is any question about what has happened.  Keep all follow-up visits with your doctor. This is very important. GET HELP IF:  You get an infection or start to feel sick. You may have more seizures when you are sick.  You are having seizures more often.  Your seizure pattern is changing. GET HELP RIGHT AWAY IF:   A seizure does not stop after a few seconds or minutes.  A seizure causes you to have trouble breathing.  A seizure gives you a very bad headache.  A seizure makes you unable to speak or use a part of your body. Document Released: 11/14/2008 Document Revised: 11/07/2012 Document Reviewed: 08/29/2012 [facility] Patient Information 2015 [place], [place]. This information is not intended to replace advice given to you by your health care provider. Make sure you discuss any questions you have with your health care provider.  Urinary Tract Infection A urinary tract infection (UTI) can occur any place along the urinary tract. The tract includes the  kidneys, ureters, bladder, and urethra. A type of germ called bacteria often causes a UTI. UTIs are often helped with antibiotic medicine.  HOME CARE   If given, take antibiotics as told by your doctor. Finish them even if you start to feel better.  Drink enough fluids to keep your pee (urine) clear or pale yellow.  Avoid tea, drinks with caffeine, and bubbly (carbonated) drinks.  Pee often. Avoid holding your pee in for a long time.  Pee before and after having sex (intercourse).  Wipe from front to back after you poop (bowel movement) if you are a woman. Use each tissue only once. GET HELP RIGHT AWAY IF:   You have back pain.  You have lower belly (abdominal) pain.  You have chills.  You feel sick to your stomach (nauseous).  You throw up (vomit).  Your burning or discomfort with peeing does not go away.  You have a fever.  Your symptoms are not better in 3 days. MAKE SURE YOU:   Understand these instructions.  Will watch your condition.  Will get help right away if you are not doing well or get worse. Document Released: 07/06/2007 Document Revised: 10/12/2011 Document Reviewed: 08/18/2011 [facility] Patient Information 2015 [place], [place]. This information is not intended to replace advice given to you by your health care provider. Make sure you discuss any questions you have with your health care provider.

## 2014-03-05 NOTE — ED Notes (Signed)
Attempted to draw blood on pt, unsuccessful, phlebotomy called

## 2014-03-05 NOTE — ED Notes (Signed)
Pt withdraws from central pain. MD at bedside

## 2014-03-05 NOTE — ED Notes (Signed)
Pt becoming more alert and complaining of stomach pain.

## 2014-03-06 ENCOUNTER — Inpatient Hospital Stay (HOSPITAL_COMMUNITY): Payer: Commercial Managed Care - HMO

## 2014-03-06 DIAGNOSIS — I5042 Chronic combined systolic (congestive) and diastolic (congestive) heart failure: Secondary | ICD-10-CM | POA: Diagnosis present

## 2014-03-06 HISTORY — DX: Chronic combined systolic (congestive) and diastolic (congestive) heart failure: I50.42

## 2014-03-06 LAB — URINE CULTURE
Colony Count: >=100000
Special Requests: NORMAL

## 2014-03-06 LAB — CBC WITH DIFFERENTIAL/PLATELET
Basophils Absolute: 0 10*3/uL (ref 0.0–0.1)
Basophils Relative: 0 % (ref 0–1)
Eosinophils Absolute: 0 10*3/uL (ref 0.0–0.7)
Eosinophils Relative: 0 % (ref 0–5)
HCT: 31.3 % — ABNORMAL LOW (ref 36.0–46.0)
Hemoglobin: 10.7 g/dL — ABNORMAL LOW (ref 12.0–15.0)
Lymphocytes Relative: 13 % (ref 12–46)
Lymphs Abs: 1.3 10*3/uL (ref 0.7–4.0)
MCH: 32.1 pg (ref 26.0–34.0)
MCHC: 34.2 g/dL (ref 30.0–36.0)
MCV: 94 fL (ref 78.0–100.0)
Monocytes Absolute: 1 10*3/uL (ref 0.1–1.0)
Monocytes Relative: 10 % (ref 3–12)
Neutro Abs: 7.9 10*3/uL — ABNORMAL HIGH (ref 1.7–7.7)
Neutrophils Relative %: 77 % (ref 43–77)
Platelets: 236 10*3/uL (ref 150–400)
RBC: 3.33 MIL/uL — ABNORMAL LOW (ref 3.87–5.11)
RDW: 15.3 % (ref 11.5–15.5)
WBC: 10.3 10*3/uL (ref 4.0–10.5)

## 2014-03-06 LAB — COMPREHENSIVE METABOLIC PANEL
ALT: 25 U/L (ref 0–35)
AST: 30 U/L (ref 0–37)
Albumin: 3.8 g/dL (ref 3.5–5.2)
Alkaline Phosphatase: 71 U/L (ref 39–117)
Anion gap: 8 (ref 5–15)
BUN: 22 mg/dL (ref 6–23)
CO2: 18 mmol/L — ABNORMAL LOW (ref 19–32)
Calcium: 8.8 mg/dL (ref 8.4–10.5)
Chloride: 111 mmol/L (ref 96–112)
Creatinine, Ser: 1.25 mg/dL — ABNORMAL HIGH (ref 0.50–1.10)
GFR calc Af Amer: 49 mL/min — ABNORMAL LOW (ref >90)
GFR calc non Af Amer: 42 mL/min — ABNORMAL LOW (ref >90)
Glucose, Bld: 109 mg/dL — ABNORMAL HIGH (ref 70–99)
Potassium: 3.9 mmol/L (ref 3.5–5.1)
Sodium: 137 mmol/L (ref 135–145)
Total Bilirubin: 0.7 mg/dL (ref 0.3–1.2)
Total Protein: 7.5 g/dL (ref 6.0–8.3)

## 2014-03-06 LAB — GLUCOSE, CAPILLARY
Glucose-Capillary: 101 mg/dL — ABNORMAL HIGH (ref 70–99)
Glucose-Capillary: 119 mg/dL — ABNORMAL HIGH (ref 70–99)
Glucose-Capillary: 123 mg/dL — ABNORMAL HIGH (ref 70–99)
Glucose-Capillary: 126 mg/dL — ABNORMAL HIGH (ref 70–99)
Glucose-Capillary: 92 mg/dL (ref 70–99)

## 2014-03-06 LAB — LIPID PANEL
Cholesterol: 175 mg/dL (ref 0–200)
HDL: 68 mg/dL (ref >39)
LDL Cholesterol: 92 mg/dL (ref 0–99)
Total CHOL/HDL Ratio: 2.6 RATIO
Triglycerides: 73 mg/dL (ref <150)
VLDL: 15 mg/dL (ref 0–40)

## 2014-03-06 LAB — AMMONIA: Ammonia: 26 umol/L (ref 11–32)

## 2014-03-06 LAB — LACTIC ACID, PLASMA: Lactic Acid, Venous: 1.1 mmol/L (ref 0.5–2.0)

## 2014-03-06 LAB — TSH: TSH: 0.754 u[IU]/mL (ref 0.350–4.500)

## 2014-03-06 LAB — CK: Total CK: 1640 U/L — ABNORMAL HIGH (ref 7–177)

## 2014-03-06 MED ORDER — METOPROLOL TARTRATE 1 MG/ML IV SOLN
5.0000 mg | INTRAVENOUS | Status: DC | PRN
  Filled 2014-03-06: qty 5

## 2014-03-06 MED ORDER — HYDRALAZINE HCL 20 MG/ML IJ SOLN: 10.0000 mg | Freq: Four times a day (QID) | INTRAMUSCULAR | Status: DC | PRN

## 2014-03-06 MED ORDER — ACETAMINOPHEN 325 MG PO TABS
650.0000 mg | ORAL_TABLET | Freq: Four times a day (QID) | ORAL | Status: DC | PRN
  Administered 2014-03-06: 650 mg via ORAL
  Filled 2014-03-06: qty 2

## 2014-03-06 MED ORDER — SODIUM CHLORIDE 0.9 % IV SOLN
INTRAVENOUS | Status: AC
  Administered 2014-03-06: 16:00:00 via INTRAVENOUS

## 2014-03-06 MED ORDER — LORAZEPAM 2 MG/ML IJ SOLN: 1.0000 mg | INTRAMUSCULAR | Status: DC | PRN

## 2014-03-06 MED ORDER — MORPHINE SULFATE 2 MG/ML IJ SOLN
1.0000 mg | Freq: Once | INTRAMUSCULAR | Status: AC
  Administered 2014-03-06: 1 mg via INTRAVENOUS
  Filled 2014-03-06: qty 1

## 2014-03-06 NOTE — Progress Notes (Signed)
PT Cancellation Note  Patient Details Name: [name] MRN: [id number] DOB: [birth year]   Cancelled Treatment:    Reason Eval/Treat Not Completed: Patient declined, no reason specified On first attempt pt getting EKG. On second attempt to perform PT evaluation, pt requesting a nap first.   Will follow up next available time.   Alvie Heidelberg A 03/06/2014, 10:24 AM  Alvie Heidelberg, PT, DPT [phone]

## 2014-03-06 NOTE — Evaluation (Signed)
Speech Language Pathology Evaluation Patient Details Name: [name] MRN: [id number] DOB: 1940/11/04 Today's Date: 03/06/2014 Time: 1040-1055 SLP Time Calculation (min) (ACUTE ONLY): 15 min  Problem List:  Patient Active Problem List   Diagnosis Date Noted  . Chronic combined systolic and diastolic CHF (congestive heart failure) 03/06/2014  . CHF (congestive heart failure) 02/24/2013  . Acute combined systolic and diastolic congestive heart failure 02/24/2013  . Weight gain 02/24/2013  . Volume overload 02/24/2013  . Hyperglycemia 02/24/2013  . Dyspnea 02/24/2013  . Constipation 02/24/2013  . Abdominal pain 02/24/2013  . Pleuritic chest pain 02/24/2013  . Epilepsy   . Smoker   . 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 Protein-calorie malnutrition, severe 02/13/2013  . Pneumobilia 02/12/2013  . Influenza A 02/11/2013  . Hyperkalemia 02/11/2013  . Hyponatremia 02/11/2013  . HCAP (healthcare-associated pneumonia) 02/11/2013  . Liver enzyme elevation 02/11/2013  . Basal ganglia infarction 01/08/2013  . CVA (cerebral infarction) 01/04/2013  . Generalized seizure 12/28/2012  . Hypertension 12/28/2012  . Seizure 12/28/2012  . Metabolic acidosis 12/28/2012  . Encephalopathy 12/28/2012  . Acute renal failure 12/28/2012   Past Medical History:  Past Medical History  Diagnosis Date  . MI (myocardial infarction)   . Hypertension   . Stroke   . Congestive heart failure (CHF)     EF 35% Nov 2014  . Epilepsy   . Smoker    Past Surgical History: History reviewed. No pertinent past surgical history. HPI:  74 yo female with history of bilateral cerebral infarctions, congestive heart failure, seizure disorder, myocardial infarction and hypertension who presents with break through seizure in setting of UTI   Assessment / Plan / Recommendation Clinical Impression  Pt's current cognitive status is impacted by her decreased level of alertness, which limits her  ability to sustain attention to basic, familiar tasks. Pt was oriented to person only, with decreased recall of reorientation information provided. Hopeful for improved overall cognition as alertness improves with increased time post-seizure, although MRI still pending. Will continue to follow.    SLP Assessment  Patient needs continued Speech Lanaguage Pathology Services    Follow Up Recommendations   (tba as alertness improves)    Frequency and Duration min 2x/week  1 week   Pertinent Vitals/Pain Pain Assessment: Faces Faces Pain Scale: Hurts even more (with movement) Pain Location: right shoulder with movement Pain Intervention(s): Monitored during session;Other (comment) (MD present and aware)   SLP Goals  Patient/Family Stated Goal: wants to go home Potential to Achieve Goals (ACUTE ONLY): Good  SLP Evaluation Prior Functioning  Cognitive/Linguistic Baseline: Baseline deficits Baseline deficit details: requiring supervision per most recent SLP note from previous admission - friend present says she provides intermittent supervision  Lives With: Alone   Cognition  Overall Cognitive Status: Impaired/Different from baseline Arousal/Alertness: Lethargic Orientation Level: Oriented to person;Disoriented to time;Disoriented to situation;Disoriented to place Attention: Sustained Sustained Attention: Impaired Sustained Attention Impairment: Functional basic;Verbal basic Memory: Impaired Memory Impairment: Decreased recall of new information Awareness: Impaired Awareness Impairment: Intellectual impairment;Emergent impairment;Anticipatory impairment Problem Solving: Impaired Problem Solving Impairment: Functional basic Safety/Judgment: Impaired    Comprehension  Auditory Comprehension Overall Auditory Comprehension: Impaired Commands: Impaired One Step Basic Commands: 75-100% accurate Conversation: Simple Interfering Components: Other (comment) (lethargy)    Oral / Motor  Motor Speech Overall Motor Speech: Appears within functional limits for tasks assessed   GO     Maxcine Ham, M.A. CCC-SLP [phone]  Maxcine Ham 03/06/2014, 11:39 AM

## 2014-03-06 NOTE — Progress Notes (Signed)
Subjective: No further seizures over night.   Objective: Current vital signs: BP 158/101 mmHg  Pulse 129  Temp(Src) 99.4 F (37.4 C) (Oral)  Resp 17  Ht 5' (1.524 m)  Wt 37.014 kg (81 lb 9.6 oz)  BMI 15.94 kg/m2  SpO2 100% Vital signs in last 24 hours: Temp:  [97.6 F (36.4 C)-99.4 F (37.4 C)] 99.4 F (37.4 C) (02/04 0600) Pulse Rate:  [76-129] 129 (02/04 0600) Resp:  [12-26] 17 (02/04 0600) BP: (141-173)/(82-106) 158/101 mmHg (02/04 0600) SpO2:  [89 %-100 %] 100 % (02/04 0600) Weight:  [36.288 kg (80 lb)-37.014 kg (81 lb 9.6 oz)] 37.014 kg (81 lb 9.6 oz) (02/03 2205)  Intake/Output from previous day: 02/03 0701 - 02/04 0700 In: 1000 [I.V.:1000] Out: -  Intake/Output this shift:   Nutritional status: Diet NPO time specified  Neurologic Exam: General: Mental Status: Alert, oriented, thought content appropriate.  Speech fluent without evidence of aphasia but minimal verbal output.   Able to follow 3 step commands without difficulty. Cranial Nerves: II:  Visual fields grossly normal, pupils equal, round, reactive to light and accommodation III,IV, VI: ptosis not present, extra-ocular motions intact bilaterally V,VII: smile symmetric, facial light touch sensation normal bilaterally VIII: hearing normal bilaterally IX,X: gag reflex present XI: bilateral shoulder shrug XII: midline tongue extension without atrophy or fasciculations  Motor: Moving left UE and bilateral LE antigravity.  prefers not to move right UE due to PAIN.  Sensory: Pinprick and light touch intact throughout, bilaterally Deep Tendon Reflexes:  1+ bilateral UE and KJ no AJ  Plantars: Right: downgoing   Left: downgoing    Lab Results: Basic Metabolic Panel:  Recent Labs Lab 03/05/14 1547 03/05/14 1811 03/06/14 0606  NA 141 142 137  K 5.5* 4.1 3.9  CL 110 110 111  CO2 22 11* 18*  GLUCOSE 107* 171* 109*  BUN 44* 38* 22  CREATININE 1.64* 1.58* 1.25*  CALCIUM 9.8 9.6 8.8    Liver  Function Tests:  Recent Labs Lab 03/05/14 2254 03/06/14 0606  AST 33 30  ALT 27 25  ALKPHOS 76 71  BILITOT 0.3 0.7  PROT 7.6 7.5  ALBUMIN 3.8 3.8   No results for input(s): LIPASE, AMYLASE in the last 168 hours.  Recent Labs Lab 03/06/14 0606  AMMONIA 26    CBC:  Recent Labs Lab 03/05/14 1547 03/06/14 0606  WBC 10.1 10.3  NEUTROABS 8.4* 7.9*  HGB 11.7* 10.7*  HCT 34.6* 31.3*  MCV 95.6 94.0  PLT 246 236    Cardiac Enzymes:  Recent Labs Lab 03/06/14 0606  CKTOTAL 1640*    Lipid Panel:  Recent Labs Lab 03/06/14 0600  CHOL 175  TRIG 73  HDL 68  CHOLHDL 2.6  VLDL 15  LDLCALC 92    CBG:  Recent Labs Lab 03/06/14 0017 03/06/14 0653  GLUCAP 92 101*    Microbiology: Results for orders placed or performed during the hospital encounter of 02/24/13  Culture, blood (routine x 2) Call MD if unable to obtain prior to antibiotics being given     Status: None   Collection Time: 02/24/13 10:50 AM  Result Value Ref Range Status   Specimen Description BLOOD LEFT ARM  Final   Special Requests BOTTLES DRAWN AEROBIC ONLY 10CC  Final   Culture  Setup Time   Final    02/24/2013 16:43 Performed at [facility]   Culture   Final    NO GROWTH 5 DAYS Performed at [facility]  Report Status 03/02/2013 FINAL  Final  Culture, blood (routine x 2) Call MD if unable to obtain prior to antibiotics being given     Status: None   Collection Time: 02/24/13 11:15 AM  Result Value Ref Range Status   Specimen Description BLOOD RIGHT ARM  Final   Special Requests BOTTLES DRAWN AEROBIC ONLY 2CC  Final   Culture  Setup Time   Final    02/24/2013 16:43 Performed at [facility]   Culture   Final    NO GROWTH 5 DAYS Performed at [facility]   Report Status 03/02/2013 FINAL  Final  Clostridium Difficile by PCR     Status: None   Collection Time: 02/25/13  3:41 PM  Result Value Ref Range Status   C difficile by pcr NEGATIVE NEGATIVE  Final    Coagulation Studies: No results for input(s): LABPROT, INR in the last 72 hours.  Imaging: Dg Ribs Unilateral W/chest Right  03/05/2014   CLINICAL DATA:  Right chest pain. Fall. Posterior right lower rib pain.  EXAM: RIGHT RIBS AND CHEST - 3+ VIEW  COMPARISON:  Chest x-ray 02/25/2013  FINDINGS: Heart is mildly enlarged. Lungs are clear. No effusions or pneumothorax. No acute bony abnormality. No visible rib fracture.  IMPRESSION: No acute findings.   Electronically Signed   By: Charlett Nose M.D.   On: 03/05/2014 14:25   Ct Head Wo Contrast  03/05/2014   CLINICAL DATA:  74 year old female found down. Fall, seizure. Initial encounter.  EXAM: CT HEAD WITHOUT CONTRAST  TECHNIQUE: Contiguous axial images were obtained from the base of the skull through the vertex without intravenous contrast.  COMPARISON:  Head CT without contrast 02/11/2013 and earlier.  FINDINGS: Chronic sphenoid sinusitis. Stable paranasal sinuses and mastoids. No acute osseous abnormality identified.  No acute orbit or scalp soft tissue finding identified.  Calcified atherosclerosis at the skull base. Cerebral volume not significantly changed. Chronic white matter hypodensity, maximal at the right frontal horn. No midline shift, mass effect, or evidence of intracranial mass lesion. No ventriculomegaly. No midline shift, mass effect, or evidence of intracranial mass lesion. Dural calcifications. No acute intracranial hemorrhage identified. No evidence of cortically based acute infarction identified. No suspicious intracranial vascular hyperdensity.  IMPRESSION: No acute intracranial abnormality. Chronic white matter changes likely due to small vessel ischemia. Chronic sphenoid sinusitis.   Electronically Signed   By: Augusto Gamble M.D.   On: 03/05/2014 15:39   Dg Abd 2 Views  03/05/2014   CLINICAL DATA:  Abdominal pain. Two seizures today. History of stroke.  EXAM: ABDOMEN - 2 VIEW  COMPARISON:  Abdominal radiograph February 24, 2013   FINDINGS: The bowel gas pattern is normal. There is no evidence of free air. Phleboliths in the pelvis. No suspicious radio-opaque calculi or other significant radiographic abnormality is seen.  IMPRESSION: Normal bowel gas pattern.   Electronically Signed   By: Awilda Metro   On: 03/05/2014 22:01    Medications:  Scheduled: . aspirin  325 mg Oral q morning - 10a  . carvedilol  3.125 mg Oral BID WC  . heparin  5,000 Units Subcutaneous 3 times per day  . lacosamide (VIMPAT) IV  100 mg Intravenous Q12H  . levETIRAcetam  750 mg Intravenous Q12H    Assessment/Plan:  74 YO female with known seizures and break through seizure in setting of UTI.  Keppra was increased to 750 mg BID and she has had no further seizures. Right sided weakness has resolved  in the leg but arm is still weak secondary to pain.   Recommend: 1) continue Keppra 750 mg BID 2) No change in Vimpat 3) MRI brain to rule out CVA. If normal no further recommendations.    Felicie Morn PA-C [facility] [phone]  03/06/2014, 10:13 AM

## 2014-03-06 NOTE — Progress Notes (Addendum)
PROGRESS NOTE    [name] WUJ:[id number] DOB: Aug 03, 1940 DOA: 03/05/2014 PCP: [name], MD  HPI/Brief narrative 74 year old female with history of CVA, HTN, chronic combined CHF, epilepsy, tobacco abuse, lives alone, apparently fell out of bed on day of admission without recollection for event, no reported alcohol or drug abuse, compliant with AED's, had a brief seizure like activity in radiology, was discharged on Keflex for UTI. While she was in the waiting room, she had another seizure-like episode lasting 1-2 minutes followed by postictal confusion. She was admitted for further management.   Assessment/Plan:  1. Seizure disorder with breakthrough seizures: Neurology consulted. She was loaded with Keppra and Keppra dose was increased to 750 MG twice a day. Continue Vimpat. Follow MRI brain to rule out CVA. No right-sided weakness noticed this morning. Alert and coherent this morning. Starting dysphagia 2 diet as per speech therapy recommendations. Although UTI was initially suspected and patient received antibiotics, patient did not have any urinary symptoms and UA was not impressive for UTI. Thereby antibiotics were not continued. 2. Essential hypertension: Mildly uncontrolled. Continue carvedilol which will also help mild tachycardia. 3. Chronic systolic CHF: LVEF 12/29/12: 35-40 percent with diffuse hypokinesis. Compensated. Lasix temporarily on hold. Consider resuming at lower dose in a.m. 4. Tobacco abuse: Cessation counseled. 5. History of CVA: Follow MRI to rule out stroke. Continue aspirin. 6. Stage III chronic kidney disease: Creatinine possibly at baseline. Renal ultrasound without acute findings. 7. Mild hyperkalemia: Resolved. 8. Mild rhabdomyolysis: Secondary to seizures and fall. Brief IV fluids and follow CK. 9. Chronic anemia: Seem stable. Follow CBCs.   Code Status: Full Family Communication: Discussed with caregiver/female friend Ms. Samantha at  bedside. Discussed with daughter Ms. Thurmond Butts & son Mr. Ralene Muskrat via phone. Disposition Plan: Home when medically stable- possibly 2/5   Consultants:  Neurology  Procedures:  None  Antibiotics:  None   Subjective: Patient states that she feels tired. Denies pain anywhere.  Objective: Filed Vitals:   03/06/14 0200 03/06/14 0400 03/06/14 0600 03/06/14 1255  BP: 146/90 152/92 158/101 127/90  Pulse: 107 111 129 97  Temp: 98.1 F (36.7 C) 98.7 F (37.1 C) 99.4 F (37.4 C) 98.1 F (36.7 C)  TempSrc: Oral Oral Oral Oral  Resp: Height:      Weight:      SpO2: 100% 100% 100% 100%    Intake/Output Summary (Last 24 hours) at 03/06/14 1448 Last data filed at 03/05/14 2003  Gross per 24 hour  Intake   1000 ml  Output      0 ml  Net   1000 ml   Filed Weights   03/05/14 1815 03/05/14 2205  Weight: 36.288 kg (80 lb) 37.014 kg (81 lb 9.6 oz)     Exam:  General exam: Elderly frail female patient sitting propped up in bed comfortably. Respiratory system: Clear. No increased work of breathing. Cardiovascular system: S1 & S2 heard, RRR. No JVD, murmurs, gallops, clicks or pedal edema. Telemetry: Sinus tachycardia in the 100s with BBB morphology. Gastrointestinal system: Abdomen is nondistended, soft and nontender. Normal bowel sounds heard. Central nervous system: Alert and oriented. No focal neurological deficits. Extremities: Symmetric 5 x 5 power.   Data Reviewed: Basic Metabolic Panel:  Recent Labs Lab 03/05/14 1547 03/05/14 1811 03/06/14 0606  NA 141 142 137  K 5.5* 4.1 3.9  CL 110 110 111  CO2 22 11* 18*  GLUCOSE 107* 171* 109*  BUN 44* 38* 22  CREATININE 1.64* 1.58* 1.25*  CALCIUM 9.8 9.6 8.8   Liver Function Tests:  Recent Labs Lab 03/05/14 2254 03/06/14 0606  AST 33 30  ALT 27 25  ALKPHOS 76 71  BILITOT 0.3 0.7  PROT 7.6 7.5  ALBUMIN 3.8 3.8   No results for input(s): LIPASE, AMYLASE in the last 168 hours.  Recent  Labs Lab 03/06/14 0606  AMMONIA 26   CBC:  Recent Labs Lab 03/05/14 1547 03/06/14 0606  WBC 10.1 10.3  NEUTROABS 8.4* 7.9*  HGB 11.7* 10.7*  HCT 34.6* 31.3*  MCV 95.6 94.0  PLT 246 236   Cardiac Enzymes:  Recent Labs Lab 03/06/14 0606  CKTOTAL 1640*   BNP (last 3 results) No results for input(s): PROBNP in the last 8760 hours. CBG:  Recent Labs Lab 03/06/14 0017 03/06/14 0653 03/06/14 1207  GLUCAP 92 101* 123*    No results found for this or any previous visit (from the past 240 hour(s)).         Studies: Dg Ribs Unilateral W/chest Right  03/05/2014   CLINICAL DATA:  Right chest pain. Fall. Posterior right lower rib pain.  EXAM: RIGHT RIBS AND CHEST - 3+ VIEW  COMPARISON:  Chest x-ray 02/25/2013  FINDINGS: Heart is mildly enlarged. Lungs are clear. No effusions or pneumothorax. No acute bony abnormality. No visible rib fracture.  IMPRESSION: No acute findings.   Electronically Signed   By: Charlett Nose M.D.   On: 03/05/2014 14:25   Ct Head Wo Contrast  03/05/2014   CLINICAL DATA:  73 year old female found down. Fall, seizure. Initial encounter.  EXAM: CT HEAD WITHOUT CONTRAST  TECHNIQUE: Contiguous axial images were obtained from the base of the skull through the vertex without intravenous contrast.  COMPARISON:  Head CT without contrast 02/11/2013 and earlier.  FINDINGS: Chronic sphenoid sinusitis. Stable paranasal sinuses and mastoids. No acute osseous abnormality identified.  No acute orbit or scalp soft tissue finding identified.  Calcified atherosclerosis at the skull base. Cerebral volume not significantly changed. Chronic white matter hypodensity, maximal at the right frontal horn. No midline shift, mass effect, or evidence of intracranial mass lesion. No ventriculomegaly. No midline shift, mass effect, or evidence of intracranial mass lesion. Dural calcifications. No acute intracranial hemorrhage identified. No evidence of cortically based acute infarction  identified. No suspicious intracranial vascular hyperdensity.  IMPRESSION: No acute intracranial abnormality. Chronic white matter changes likely due to small vessel ischemia. Chronic sphenoid sinusitis.   Electronically Signed   By: Augusto Gamble M.D.   On: 03/05/2014 15:39   US Renal  03/06/2014   CLINICAL DATA:  Flank pain, uric acid crystal uria  EXAM: RENAL/URINARY TRACT ULTRASOUND COMPLETE  COMPARISON:  None.  FINDINGS: Right Kidney:  Length: 9.5 cm. There is mild increased echotexture of the right kidney. No mass visualized. There is minimal prominence of right renal pelvis.  Left Kidney:  Length: 9.5. Echogenicity within normal limits. No mass or hydronephrosis visualized.  Bladder:  Decompressed limiting evaluation  IMPRESSION: Mild increased echotexture of the right kidney. This is nonspecific but can be seen in medical renal disease.   Electronically Signed   By: Sherian Rein M.D.   On: 03/06/2014 11:12   Dg Abd 2 Views  03/05/2014   CLINICAL DATA:  Abdominal pain. Two seizures today. History of stroke.  EXAM: ABDOMEN - 2 VIEW  COMPARISON:  Abdominal radiograph February 24, 2013  FINDINGS: The bowel gas pattern is normal. There is no evidence of free air. Phleboliths in  the pelvis. No suspicious radio-opaque calculi or other significant radiographic abnormality is seen.  IMPRESSION: Normal bowel gas pattern.   Electronically Signed   By: Awilda Metro   On: 03/05/2014 22:01        Scheduled Meds: . aspirin  325 mg Oral q morning - 10a  . carvedilol  3.125 mg Oral BID WC  . heparin  5,000 Units Subcutaneous 3 times per day  . lacosamide (VIMPAT) IV  100 mg Intravenous Q12H  . levETIRAcetam  750 mg Intravenous Q12H   Continuous Infusions: . sodium chloride 75 mL/hr at 03/05/14 2305    Principal Problem:   Seizure Active Problems:   Hypertension   Encephalopathy   CVA (cerebral infarction)   Ataxia, late effect of cerebrovascular disease   Smoker   Chronic combined systolic and  diastolic CHF (congestive heart failure)    Time spent: 30 minutes.    Marcellus Scott, MD, FACP, FHM. [facility] Pager [phone]  If 7PM-7AM, please contact night-coverage [email or web address] Password [email or web address] 03/06/2014, 2:48 PM    LOS: 1 day

## 2014-03-06 NOTE — Progress Notes (Signed)
CARE MANAGEMENT NOTE 03/06/2014  Patient:  [name], [name]   Account Number:  [id number]  Date Initiated:  03/06/2014  Documentation initiated by:  Jiles Crocker  Subjective/Objective Assessment:   ADMITTED WITH SEIZURE     Action/Plan:   CM FOLLOWING FOR DCP   Anticipated DC Date:  03/08/2014   Anticipated DC Plan:  POSSIBLY HOME/SELF CARE     DC Planning Services  CM consult         Status of service:  In process, will continue to follow Medicare Important Message given?   (If response is "NO", the following Medicare IM given date fields will be blank)  Per UR Regulation:  Reviewed for med. necessity/level of care/duration of stay  Comments:  2/4/2016Abelino Derrick RN,BSN,MHA [phone]

## 2014-03-06 NOTE — Progress Notes (Signed)
SLP Cancellation Note  Patient Details Name: [name] MRN: [id number] DOB: [birth year]   Cancelled treatment:       Reason Eval/Treat Not Completed: Patient at procedure or test/unavailable    Maxcine Ham, M.A. CCC-SLP [phone]  Maxcine Ham 03/06/2014, 9:05 AM

## 2014-03-06 NOTE — Evaluation (Signed)
Occupational Therapy Evaluation Patient Details Name: [name] MRN: [id number] DOB: 03-10-1940 Today's Date: 03/06/2014    History of Present Illness Patient is a 74 y/o female with PMH of CVA, HTN, chronic combined CHF, epilepsy and an active smoker presents with seizure-like symptoms. Pt had an episode of seizure-like activity at xray after which pt quickly returned to baseline and did not have any postictal state. s/p another seizure in ER lasting 1-2 mins, which pt remained postitcal. Head CT- (-).   Clinical Impression   Patient independent > mod I PTA. Patient currently functioning at an overall min assist level. Patient will benefit from acute OT to increase overall independence in the areas of ADLs, functional mobility, overall safety in order to safely discharge home.     Follow Up Recommendations  Home health OT;Supervision/Assistance - 24 hour    Equipment Recommendations  None recommended by OT    Recommendations for Other Services  None at this time     Precautions / Restrictions Precautions Precautions: Fall Restrictions Weight Bearing Restrictions: No      Mobility - Per PT eval Bed Mobility Overal bed mobility: Needs Assistance Bed Mobility: Supine to Sit     Supine to sit: Mod assist;HOB elevated     General bed mobility comments: Cues for technique and constant cues to perform tasks as pt lethargic. Mod A to bring BLEs to EOB and with trunk elevation.  Transfers Overall transfer level: Needs assistance Equipment used: None Transfers: Sit to/from Stand Sit to Stand: Min assist         General transfer comment: Min A to stand from EOB and from toilet. Pt impulsive at times. Sway with standing.     Balance - Per PT eval Overall balance assessment: Needs assistance;History of Falls Sitting-balance support: Feet supported;No upper extremity supported Sitting balance-Leahy Scale: Fair Sitting balance - Comments: Not able to tolerate challenges  today.   Standing balance support: During functional activity Standing balance-Leahy Scale: Poor Standing balance comment: Requires constant support to maintain balance in standing.                            ADL Overall ADL's : Needs assistance/impaired     Grooming: Minimal assistance   Upper Body Bathing: Minimal assitance   Lower Body Bathing: Minimal assistance   Upper Body Dressing : Minimal assistance   Lower Body Dressing: Minimal assistance                 General ADL Comments: Patient overall min assist and requires cues for re-direction and initiation. Patient with increased lethargy and barely able to keep eyes open during OT eval/treat.      Vision   Additional Comments: Unable to fully assess due to patient's increased lethargy          Pertinent Vitals/Pain Pain Assessment: No/denies pain Faces Pain Scale: Hurts even more Pain Location: RUE with movement Pain Intervention(s): Monitored during session;Repositioned     Hand Dominance Left   Extremity/Trunk Assessment Upper Extremity Assessment Upper Extremity Assessment: RUE deficits/detail;LUE deficits/detail RUE Deficits / Details: decreased overall ROM and strengh throughout  LUE Deficits / Details: decreased overall ROm and strengh throughout    Lower Extremity Assessment Lower Extremity Assessment: Generalized weakness       Communication Communication Communication: No difficulties   Cognition Arousal/Alertness: Lethargic Behavior During Therapy: Flat affect Overall Cognitive Status: Impaired/Different from baseline Area of Impairment: Attention;Following commands;Safety/judgement;Awareness;Problem solving  Following Commands: Follows multi-step commands inconsistently Safety/Judgement: Decreased awareness of safety;Decreased awareness of deficits   Problem Solving: Slow processing;Requires verbal cues;Requires tactile cues General Comments: Pt very lethargic  during session. Difficult to stay aroused.              Home Living Family/patient expects to be discharged to:: Private residence Living Arrangements: Alone Available Help at Discharge: Available PRN/intermittently;Friend(s);Family Type of Home: House Home Access: Stairs to enter [employer] of Steps: 1 Entrance Stairs-Rails: None Home Layout: One level     Bathroom Shower/Tub: Tub/shower unit;Curtain   Firefighter: Standard     Home Equipment: Cane - single point;Bedside commode;Shower seat      Lives With: Alone    Prior Functioning/Environment Level of Independence: Needs assistance  Gait / Transfers Assistance Needed: Pt reports independent with ambulation. Has a SPC but has not used it in awhile. ADL's / Homemaking Assistance Needed: Reports (I) for ADLs and drives. Has a friend come to assist with medication management and IADLs.        OT Diagnosis: Generalized weakness;Acute pain   OT Problem List: Decreased strength;Decreased range of motion;Decreased activity tolerance;Impaired balance (sitting and/or standing);Decreased safety awareness;Decreased knowledge of use of DME or AE;Decreased knowledge of precautions;Pain   OT Treatment/Interventions: Self-care/ADL training;Therapeutic exercise;Energy conservation;DME and/or AE instruction;Therapeutic activities;Patient/family education;Balance training    OT Goals(Current goals can be found in the care plan section) Acute Rehab OT Goals Patient Stated Goal: none stated OT Goal Formulation: With patient Time For Goal Achievement: 03/13/14 Potential to Achieve Goals: Good ADL Goals Pt Will Perform Upper Body Bathing: sitting;Independently Pt Will Perform Lower Body Bathing: with modified independence;sit to/from stand Pt Will Perform Upper Body Dressing: Independently;sitting Pt Will Perform Lower Body Dressing: with modified independence;sit to/from stand Pt Will Transfer to Toilet: with  supervision;ambulating;bedside commode Pt Will Perform Tub/Shower Transfer: with supervision;ambulating;shower seat Pt/caregiver will Perform Home Exercise Program: Increased strength;Both right and left upper extremity;With written HEP provided;With Supervision  OT Frequency: Min 2X/week   Barriers to D/C: Decreased caregiver support          End of Session    Activity Tolerance: Patient limited by lethargy Patient left: in chair;with call bell/phone within reach;with chair alarm set;with family/visitor present   Time: 1320-1336 OT Time Calculation (min): 16 min Charges:  OT Evaluation $Initial OT Evaluation Tier I: 1 Procedure  [name] , MS, OTR/L, CLT Pager: [phone]  03/06/2014, 1:45 PM

## 2014-03-06 NOTE — Progress Notes (Signed)
Patient transferred to 4N24 from [facility] ED, very drowsy. Pt oriented to room and equipment. Cardiac monitoring initiated. Initial assessment performed. Pt is minimally communicative, follows commands, and responds to simple questions. Bilateral weakness noted in upper extremities. Bed in lowest position and call light within reach. Will monitor closely overnight.

## 2014-03-06 NOTE — Evaluation (Signed)
Clinical/Bedside Swallow Evaluation Patient Details  Name: [name] MRN: [id number] Date of Birth: Jun 22, 1940  Today's Date: 03/06/2014 Time: SLP Start Time (ACUTE ONLY): 1026 SLP Stop Time (ACUTE ONLY): 1040 SLP Time Calculation (min) (ACUTE ONLY): 14 min  Past Medical History:  Past Medical History  Diagnosis Date  . MI (myocardial infarction)   . Hypertension   . Stroke   . Congestive heart failure (CHF)     EF 35% Nov 2014  . Epilepsy   . Smoker    Past Surgical History: History reviewed. No pertinent past surgical history. HPI:  74 yo female with history of bilateral cerebral infarctions, congestive heart failure, seizure disorder, myocardial infarction and hypertension who presents with break through seizure in setting of UTI   Assessment / Plan / Recommendation Clinical Impression  Pt requires Max cues from SLP to maintain alertness and sustain attention to self-feeding task. During brief periods of alertness, she is able to orally accept and transit boluses with apparent automaticity, although with consistent throat clearing after sips of thin liquids. Suspect premature spillage secondary to decreased attention. Recommend Dys 2 diet and nectar thick liquids, to be consumed when optimally alert. Prognosis for advancement is likely good with improved LOA.     Aspiration Risk  Mild    Diet Recommendation Dysphagia 2 (Fine chop);Nectar-thick liquid   Liquid Administration via: Cup;No straw Medication Administration: Whole meds with puree Supervision: Patient able to self feed;Full supervision/cueing for compensatory strategies Compensations: Slow rate;Small sips/bites Postural Changes and/or Swallow Maneuvers: Seated upright 90 degrees    Other  Recommendations Oral Care Recommendations: Oral care BID Other Recommendations: Order thickener from pharmacy;Remove water pitcher;Prohibited food (jello, ice cream, thin soups)   Follow Up Recommendations   (tba as alertness  improves)    Frequency and Duration min 2x/week  2 weeks   Pertinent Vitals/Pain n/a    SLP Swallow Goals     Swallow Study Prior Functional Status       General Date of Onset: 03/05/14 HPI: 74 yo female with history of bilateral cerebral infarctions, congestive heart failure, seizure disorder, myocardial infarction and hypertension who presents with break through seizure in setting of UTI Type of Study: Bedside swallow evaluation Previous Swallow Assessment: previous BSE recommending regular diet and thin liquids Diet Prior to this Study: NPO Temperature Spikes Noted: Yes (low grade) Respiratory Status: Nasal cannula History of Recent Intubation: No Behavior/Cognition: Lethargic;Requires cueing Oral Cavity - Dentition: Adequate natural dentition Self-Feeding Abilities: Needs assist Patient Positioning: Upright in bed Baseline Vocal Quality: Clear    Oral/Motor/Sensory Function     Ice Chips Ice chips: Not tested   Thin Liquid Thin Liquid: Impaired Presentation: Cup;Self Fed Pharyngeal  Phase Impairments: Suspected delayed Swallow;Throat Clearing - Immediate;Throat Clearing - Delayed    Nectar Thick Nectar Thick Liquid: Impaired Presentation: Cup;Self Fed Pharyngeal Phase Impairments: Throat Clearing - Delayed   Honey Thick Honey Thick Liquid: Not tested   Puree Puree: Within functional limits Presentation: Self Fed;Spoon   Solid   GO    Solid: Impaired Presentation: Self Fed Oral Phase Impairments: Impaired mastication       Maxcine Ham, M.A. CCC-SLP [phone]  Maxcine Ham 03/06/2014,11:21 AM

## 2014-03-06 NOTE — Evaluation (Signed)
Physical Therapy Evaluation Patient Details Name: [name] MRN: [id number] DOB: Mar 18, 1940 Today's Date: 03/06/2014   History of Present Illness  Patient is a 74 y/o female with PMH of CVA, HTN, chronic combined CHF, epilepsy and an active smoker presents with seizure-like symptoms. Pt had an episode of seizure-like activity at xray after which pt quickly returned to baseline and did not have any postictal state. s/p another seizure in ER lasting 1-2 mins, which pt remained postitcal. Head CT- (-).  Clinical Impression  Patient presents with functional limitations due to deficits listed in PT problem list (see below). Pt with generalized weakness, decreased arousal and balance deficits impacting safe mobility. Tolerated short distance ambulation to bathroom with min A to prevent fall. Pt would benefit from skilled PT to improve transfers, gait, balance and mobility so pt can maximize independence and return to PLOF. Anticipate balance and mobility will improve with use of AD and increased arousal.    Follow Up Recommendations Home health PT;Supervision/Assistance - 24 hour    Equipment Recommendations  Other (comment) (TBD.)    Recommendations for Other Services OT consult     Precautions / Restrictions Precautions Precautions: Fall Restrictions Weight Bearing Restrictions: No      Mobility  Bed Mobility Overal bed mobility: Needs Assistance Bed Mobility: Supine to Sit     Supine to sit: Mod assist;HOB elevated     General bed mobility comments: Cues for technique and constant cues to perform tasks as pt lethargic. Mod A to bring BLEs to EOB and with trunk elevation.  Transfers Overall transfer level: Needs assistance Equipment used: None Transfers: Sit to/from Stand Sit to Stand: Min assist         General transfer comment: Min A to stand from EOB and from toilet. Pt impulsive at times. Sway with standing.   Ambulation/Gait Ambulation/Gait assistance: Min  assist Ambulation Distance (Feet): 15 Feet (x2 bouts) Assistive device: None Gait Pattern/deviations: Step-through pattern;Decreased stride length;Staggering left;Staggering right;Drifts right/left;Trunk flexed;Narrow base of support   Gait velocity interpretation: Below normal speed for age/gender General Gait Details: Pt with unsteady gait with drifting to right and left and staggering into wall/door. Impulsive at times. Constant min A to maintain balance.   Stairs            Wheelchair Mobility    Modified Rankin (Stroke Patients Only)       Balance Overall balance assessment: Needs assistance;History of Falls Sitting-balance support: Feet supported;No upper extremity supported Sitting balance-Leahy Scale: Fair Sitting balance - Comments: Not able to tolerate challenges today.   Standing balance support: During functional activity Standing balance-Leahy Scale: Poor Standing balance comment: Requires constant support to maintain balance in standing.                             Pertinent Vitals/Pain Pain Assessment: Faces Faces Pain Scale: Hurts even more Pain Location: RUE with movement Pain Intervention(s): Monitored during session;Repositioned    Home Living Family/patient expects to be discharged to:: Private residence Living Arrangements: Alone Available Help at Discharge: Available PRN/intermittently;Friend(s);Family Type of Home: House Home Access: Stairs to enter Entrance Stairs-Rails: None Entrance Stairs-Number of Steps: 1 Home Layout: One level Home Equipment: Cane - single point      Prior Function Level of Independence: Needs assistance   Gait / Transfers Assistance Needed: Pt reports independent with ambulation. Has a SPC but has not used it in awhile.  ADL's / Homemaking Assistance Needed: Reports (  I) for ADLs and drives. Has a friend come to assist with medication management and IADLs.        Hand Dominance   Dominant Hand:  Left    Extremity/Trunk Assessment   Upper Extremity Assessment: Defer to OT evaluation;RUE deficits/detail RUE Deficits / Details: Pt with decreased functional use of RUE as noted during functional mobility and transfers.         Lower Extremity Assessment: Generalized weakness         Communication   Communication: No difficulties  Cognition Arousal/Alertness: Lethargic Behavior During Therapy: Flat affect Overall Cognitive Status: Impaired/Different from baseline Area of Impairment: Safety/judgement;Following commands;Problem solving       Following Commands: Follows multi-step commands inconsistently;Follows multi-step commands with increased time Safety/Judgement: Decreased awareness of safety;Decreased awareness of deficits   Problem Solving: Slow processing;Requires verbal cues;Requires tactile cues General Comments: Pt very lethargic during session. Difficult to stay aroused.    General Comments General comments (skin integrity, edema, etc.): Pt incontinent of urine upon standing. Rn notified.    Exercises        Assessment/Plan    PT Assessment Patient needs continued PT services  PT Diagnosis Difficulty walking;Generalized weakness;Acute pain   PT Problem List Decreased strength;Pain;Decreased cognition;Decreased range of motion;Decreased activity tolerance;Decreased balance;Decreased mobility;Decreased safety awareness  PT Treatment Interventions Balance training;Gait training;Patient/family education;Functional mobility training;Therapeutic activities;Therapeutic exercise;Stair training;Neuromuscular re-education;DME instruction   PT Goals (Current goals can be found in the Care Plan section) Acute Rehab PT Goals Patient Stated Goal: none stated PT Goal Formulation: With patient Time For Goal Achievement: 03/20/14 Potential to Achieve Goals: Fair    Frequency Min 3X/week   Barriers to discharge Decreased caregiver support Pt lives alone.     Co-evaluation               End of Session Equipment Utilized During Treatment: Gait belt Activity Tolerance: Patient limited by lethargy Patient left: in chair;with call bell/phone within reach;with chair alarm set;with family/visitor present Nurse Communication: Mobility status         Time: 2103-1281 PT Time Calculation (min) (ACUTE ONLY): 18 min   Charges:   PT Evaluation $Initial PT Evaluation Tier I: 1 Procedure     PT G CodesAlvie Heidelberg A 2014/03/21, 12:07 PM Alvie Heidelberg, PT, DPT [phone]

## 2014-03-07 ENCOUNTER — Inpatient Hospital Stay (HOSPITAL_COMMUNITY): Payer: Commercial Managed Care - HMO

## 2014-03-07 DIAGNOSIS — N390 Urinary tract infection, site not specified: Secondary | ICD-10-CM

## 2014-03-07 LAB — BASIC METABOLIC PANEL
Anion gap: 4 — ABNORMAL LOW (ref 5–15)
BUN: 20 mg/dL (ref 6–23)
CO2: 21 mmol/L (ref 19–32)
Calcium: 8.7 mg/dL (ref 8.4–10.5)
Chloride: 113 mmol/L — ABNORMAL HIGH (ref 96–112)
Creatinine, Ser: 1.77 mg/dL — ABNORMAL HIGH (ref 0.50–1.10)
GFR calc Af Amer: 32 mL/min — ABNORMAL LOW (ref >90)
GFR calc non Af Amer: 28 mL/min — ABNORMAL LOW (ref >90)
Glucose, Bld: 101 mg/dL — ABNORMAL HIGH (ref 70–99)
Potassium: 4.1 mmol/L (ref 3.5–5.1)
Sodium: 138 mmol/L (ref 135–145)

## 2014-03-07 LAB — HEMOGLOBIN A1C
Hgb A1c MFr Bld: 5.8 % — ABNORMAL HIGH (ref 4.8–5.6)
Mean Plasma Glucose: 120 mg/dL

## 2014-03-07 LAB — CK: Total CK: 1093 U/L — ABNORMAL HIGH (ref 7–177)

## 2014-03-07 LAB — GLUCOSE, CAPILLARY
Glucose-Capillary: 92 mg/dL (ref 70–99)
Glucose-Capillary: 85 mg/dL (ref 70–99)

## 2014-03-07 LAB — CBC
HCT: 33 % — ABNORMAL LOW (ref 36.0–46.0)
Hemoglobin: 10.8 g/dL — ABNORMAL LOW (ref 12.0–15.0)
MCH: 32.2 pg (ref 26.0–34.0)
MCHC: 32.7 g/dL (ref 30.0–36.0)
MCV: 98.5 fL (ref 78.0–100.0)
Platelets: 212 10*3/uL (ref 150–400)
RBC: 3.35 MIL/uL — ABNORMAL LOW (ref 3.87–5.11)
RDW: 15.4 % (ref 11.5–15.5)
WBC: 6.8 10*3/uL (ref 4.0–10.5)

## 2014-03-07 MED ORDER — SODIUM CHLORIDE 0.9 % IV BOLUS (SEPSIS)
250.0000 mL | Freq: Once | INTRAVENOUS | Status: AC
  Administered 2014-03-07: 250 mL via INTRAVENOUS

## 2014-03-07 MED ORDER — KETOROLAC TROMETHAMINE 15 MG/ML IJ SOLN
15.0000 mg | Freq: Once | INTRAMUSCULAR | Status: AC
  Administered 2014-03-07: 15 mg via INTRAVENOUS
  Filled 2014-03-07: qty 1

## 2014-03-07 MED ORDER — HYDROCODONE-ACETAMINOPHEN 5-325 MG PO TABS
1.0000 | ORAL_TABLET | Freq: Four times a day (QID) | ORAL | Status: DC | PRN
  Administered 2014-03-07 – 2014-03-08 (×5): 1 via ORAL
  Filled 2014-03-07 (×5): qty 1

## 2014-03-07 MED ORDER — HYDROCODONE-ACETAMINOPHEN 5-325 MG PO TABS
2.0000 | ORAL_TABLET | Freq: Once | ORAL | Status: AC
  Administered 2014-03-07: 2 via ORAL
  Filled 2014-03-07: qty 2

## 2014-03-07 MED ORDER — CEFTRIAXONE SODIUM IN DEXTROSE 20 MG/ML IV SOLN
1.0000 g | INTRAVENOUS | Status: DC
  Administered 2014-03-07 – 2014-03-09 (×3): 1 g via INTRAVENOUS
  Filled 2014-03-07 (×3): qty 50

## 2014-03-07 MED ORDER — SODIUM CHLORIDE 0.9 % IV SOLN
INTRAVENOUS | Status: DC
  Administered 2014-03-07: 12:00:00 via INTRAVENOUS

## 2014-03-07 MED ORDER — CYCLOBENZAPRINE HCL 10 MG PO TABS
5.0000 mg | ORAL_TABLET | Freq: Once | ORAL | Status: AC
  Administered 2014-03-07: 5 mg via ORAL
  Filled 2014-03-07: qty 1

## 2014-03-07 NOTE — Progress Notes (Signed)
Speech Language Pathology Treatment: Dysphagia;Cognitive-Linquistic  Patient Details Name: [name] MRN: [id number] DOB: Nov 17, 1940 Today's Date: 03/07/2014 Time: 5726-2035 SLP Time Calculation (min) (ACUTE ONLY): 18 min  Assessment / Plan / Recommendation Clinical Impression  Pt with notable improvements given increased level of alertness as compared to previous date. No overt s/s of aspiration observed with regular textures and thin liquids, which pt consumed with Mod I. Will liberalize diet to regular diet and thin liquids, with brief f/u to ensure tolerance.  Cognitively, pt is independently oriented x4 and sustaining attention to tasks. She does however continue to have difficulty recalling new information, requiring Mod cues from SLP for recall of day's events. Recommend HH SLP and 24/7 supervision upon return home due to cognitive changes - do not anticipate f/u for swallowing.   HPI HPI: 74 yo female with history of bilateral cerebral infarctions, congestive heart failure, seizure disorder, myocardial infarction and hypertension who presents with break through seizure in setting of UTI   Pertinent Vitals Pain Assessment: No/denies pain  SLP Plan  Continue with current plan of care;Goals updated    Recommendations Diet recommendations: Regular;Thin liquid Liquids provided via: Cup;Straw Medication Administration: Whole meds with liquid Supervision: Patient able to self feed;Intermittent supervision to cue for compensatory strategies Compensations: Slow rate;Small sips/bites Postural Changes and/or Swallow Maneuvers: Seated upright 90 degrees       Oral Care Recommendations: Oral care BID Follow up Recommendations: Home health SLP;24 hour supervision/assistance (for cognition) Plan: Continue with current plan of care;Goals updated    GO     Maxcine Ham, M.A. CCC-SLP [phone]  Maxcine Ham 03/07/2014, 2:16 PM

## 2014-03-07 NOTE — Progress Notes (Signed)
[facility] PROGRESS NOTE  [name] ZOX:[id number] DOB: 02-Aug-1940 DOA: 03/05/2014 PCP: [name], MD  Assessment/Plan: 74 y/o female with PMH of CVA, HTN, CHF, epilepsy, tobacco abuse, lives alone, apparently fell out of bed on day of admission without recollection for even, had two episodes of seizure  -admitted for further management seizures, UTI  1. Seizure disorder with breakthrough seizures: Neurology consulted. She was loaded with Keppra and Keppra dose was increased to 750 MG twice a day. Continue Vimpat.  -MRI brain: no CVA.   2. URI, febrile 2/4; patient was on PO keflex; started IV atx 2/5; repeat UA/cultures;   3. Essential hypertension: Mildly uncontrolled. Continue carvedilol which will also help mild tachycardia. 4. Chronic systolic CHF: LVEF 12/29/12: 35-40 percent with diffuse hypokinesis. Compensated. Lasix temporarily on hold. Consider resuming at lower dose on 2/6 5.Stage III chronic kidney disease: Creatinine possibly at baseline. Renal ultrasound without acute findings. 6. Mild rhabdomyolysis: Secondary to seizures and fall. Brief IV fluids and follow CK-trending down   Code Status: ful Family Communication: d/w patient, no family at the bedside  (indicate person spoken with, relationship, and if by phone, the number) Disposition Plan: home 24-48 hrs    Consultants:  Neurology   Procedures:  none  Antibiotics:  Ceftriaxone 2/5>>>>>   (indicate start date, and stop date if known)  HPI/Subjective: alert  Objective: Filed Vitals:   03/07/14 0919  BP: 105/76  Pulse: 83  Temp: 98 F (36.7 C)  Resp: 18    Intake/Output Summary (Last 24 hours) at 03/07/14 0932 Last data filed at 03/07/14 0852  Gross per 24 hour  Intake    720 ml  Output    250 ml  Net    470 ml   Filed Weights   03/05/14 1815 03/05/14 2205  Weight: 36.288 kg (80 lb) 37.014 kg (81 lb 9.6 oz)    Exam:   General:  alert  Cardiovascular: s1,s2  rrr  Respiratory: CTA BL  Abdomen: soft, nt,nd   Musculoskeletal: no leg edema   Data Reviewed: Basic Metabolic Panel:  Recent Labs Lab 03/05/14 1547 03/05/14 1811 03/06/14 0606 03/07/14 0550  NA 141 142 137 138  K 5.5* 4.1 3.9 4.1  CL 110 110 111 113*  CO2 22 11* 18* 21  GLUCOSE 107* 171* 109* 101*  BUN 44* 38* 22 20  CREATININE 1.64* 1.58* 1.25* 1.77*  CALCIUM 9.8 9.6 8.8 8.7   Liver Function Tests:  Recent Labs Lab 03/05/14 2254 03/06/14 0606  AST 33 30  ALT 27 25  ALKPHOS 76 71  BILITOT 0.3 0.7  PROT 7.6 7.5  ALBUMIN 3.8 3.8   No results for input(s): LIPASE, AMYLASE in the last 168 hours.  Recent Labs Lab 03/06/14 0606  AMMONIA 26   CBC:  Recent Labs Lab 03/05/14 1547 03/06/14 0606 03/07/14 0550  WBC 10.1 10.3 6.8  NEUTROABS 8.4* 7.9*  --   HGB 11.7* 10.7* 10.8*  HCT 34.6* 31.3* 33.0*  MCV 95.6 94.0 98.5  PLT 246 236 212   Cardiac Enzymes:  Recent Labs Lab 03/06/14 0606 03/07/14 0550  CKTOTAL 1640* 1093*   BNP (last 3 results) No results for input(s): BNP in the last 8760 hours.  ProBNP (last 3 results) No results for input(s): PROBNP in the last 8760 hours.  CBG:  Recent Labs Lab 03/06/14 0653 03/06/14 1207 03/06/14 1945 03/06/14 2351 03/07/14 0618  GLUCAP 101* 123* 126* 119* 92    Recent Results (from the past 240 hour(s))  Urine culture     Status: None   Collection Time: 03/05/14  4:13 PM  Result Value Ref Range Status   Specimen Description URINE, CLEAN CATCH  Final   Special Requests Normal  Final   Colony Count   Final    >=100,000 COLONIES/ML Performed at [facility]    Culture   Final    Multiple bacterial morphotypes present, none predominant. Suggest appropriate recollection if clinically indicated. Performed at [facility]    Report Status 03/06/2014 FINAL  Final     Studies: Dg Ribs Unilateral W/chest Right  03/05/2014   CLINICAL DATA:  Right chest pain. Fall. Posterior right  lower rib pain.  EXAM: RIGHT RIBS AND CHEST - 3+ VIEW  COMPARISON:  Chest x-ray 02/25/2013  FINDINGS: Heart is mildly enlarged. Lungs are clear. No effusions or pneumothorax. No acute bony abnormality. No visible rib fracture.  IMPRESSION: No acute findings.   Electronically Signed   By: Charlett Nose M.D.   On: 03/05/2014 14:25   Ct Head Wo Contrast  03/05/2014   CLINICAL DATA:  74 year old female found down. Fall, seizure. Initial encounter.  EXAM: CT HEAD WITHOUT CONTRAST  TECHNIQUE: Contiguous axial images were obtained from the base of the skull through the vertex without intravenous contrast.  COMPARISON:  Head CT without contrast 02/11/2013 and earlier.  FINDINGS: Chronic sphenoid sinusitis. Stable paranasal sinuses and mastoids. No acute osseous abnormality identified.  No acute orbit or scalp soft tissue finding identified.  Calcified atherosclerosis at the skull base. Cerebral volume not significantly changed. Chronic white matter hypodensity, maximal at the right frontal horn. No midline shift, mass effect, or evidence of intracranial mass lesion. No ventriculomegaly. No midline shift, mass effect, or evidence of intracranial mass lesion. Dural calcifications. No acute intracranial hemorrhage identified. No evidence of cortically based acute infarction identified. No suspicious intracranial vascular hyperdensity.  IMPRESSION: No acute intracranial abnormality. Chronic white matter changes likely due to small vessel ischemia. Chronic sphenoid sinusitis.   Electronically Signed   By: Augusto Gamble M.D.   On: 03/05/2014 15:39   Mr Brain Wo Contrast  03/06/2014   CLINICAL DATA:  Fall from bed onto the floor. The patient is amnestic of the event and does not know how long she was on the floor. Seizure-like activity. Personal history of seizures. Personal history of cerebral infarct.  EXAM: MRI HEAD WITHOUT CONTRAST  TECHNIQUE: Multiplanar, multiecho pulse sequences of the brain and surrounding structures were  obtained without intravenous contrast.  COMPARISON:  CT head without contrast 03/05/2014. MRI brain 12/28/2012.  FINDINGS: The diffusion-weighted images demonstrate no evidence for acute or subacute infarction. Extensive confluent periventricular and subcortical T2 changes are evident bilaterally. A remote hemorrhagic infarct is noted along the medial posterior left frontal lobe with associated encephalomalacia. A remote lacunar infarct is again noted within the right basal ganglia. There is diffuse signal abnormality within the thalami bilaterally, worse on the left. A remote posterior right cerebellar lacunar infarct is present.  No acute hemorrhage or mass lesion is present. The ventricles are proportionate to the degree of atrophy.  No significant extra-axial fluid collection is present. The left lens has been replaced. The globes and orbits are otherwise within normal limits. Circumferential mucosal thickening is present in the left sphenoid sinus. The remaining paranasal sinuses are clear. There is some fluid in the left mastoid air cells. No obstructing nasopharyngeal lesion is evident.  IMPRESSION: 1. No acute intracranial abnormality or significant interval change. 2. Stable atrophy  and diffuse white matter disease. This likely reflects the sequela of chronic microvascular ischemia. 3. Chronic left sphenoid sinus disease. 4. Fluid in left mastoid air cells without obstructive nasopharyngeal lesion.   Electronically Signed   By: Gennette Pac M.D.   On: 03/06/2014 21:07   US Renal  03/06/2014   CLINICAL DATA:  Flank pain, uric acid crystal uria  EXAM: RENAL/URINARY TRACT ULTRASOUND COMPLETE  COMPARISON:  None.  FINDINGS: Right Kidney:  Length: 9.5 cm. There is mild increased echotexture of the right kidney. No mass visualized. There is minimal prominence of right renal pelvis.  Left Kidney:  Length: 9.5. Echogenicity within normal limits. No mass or hydronephrosis visualized.  Bladder:  Decompressed  limiting evaluation  IMPRESSION: Mild increased echotexture of the right kidney. This is nonspecific but can be seen in medical renal disease.   Electronically Signed   By: Sherian Rein M.D.   On: 03/06/2014 11:12   Dg Shoulder Left  03/07/2014   CLINICAL DATA:  Status post fall during seizure activity, persistent pain in the posterior aspect of the left shoulder.  EXAM: LEFT SHOULDER - 2+ VIEW  COMPARISON:  Chest X ray of March 05, 2014 and chest x-ray of February 25, 2013  FINDINGS: The bones of the left shoulder are adequately mineralized. The glenohumeral and AC joints are grossly intact. The acromion and coracoid are intact where visualized. The observed portions of the left clavicle and upper left ribs are normal. The body of the scapula is grossly normal. There is chronic partial compression of the body of T7.  IMPRESSION: There is no acute bony abnormality of the left shoulder.   Electronically Signed   By: David  [place]   On: 03/07/2014 07:49   Dg Abd 2 Views  03/05/2014   CLINICAL DATA:  Abdominal pain. Two seizures today. History of stroke.  EXAM: ABDOMEN - 2 VIEW  COMPARISON:  Abdominal radiograph February 24, 2013  FINDINGS: The bowel gas pattern is normal. There is no evidence of free air. Phleboliths in the pelvis. No suspicious radio-opaque calculi or other significant radiographic abnormality is seen.  IMPRESSION: Normal bowel gas pattern.   Electronically Signed   By: Awilda Metro   On: 03/05/2014 22:01    Scheduled Meds: . aspirin  325 mg Oral q morning - 10a  . carvedilol  3.125 mg Oral BID WC  . cefTRIAXone (ROCEPHIN)  IV  1 g Intravenous Q24H  . heparin  5,000 Units Subcutaneous 3 times per day  . lacosamide (VIMPAT) IV  100 mg Intravenous Q12H  . levETIRAcetam  750 mg Intravenous Q12H   Continuous Infusions:   Principal Problem:   Seizure Active Problems:   Hypertension   Encephalopathy   CVA (cerebral infarction)   Ataxia, late effect of cerebrovascular disease    Smoker   Chronic combined systolic and diastolic CHF (congestive heart failure)    Time spent: >35 minutes     Esperanza Sheets  [facility] Pager [phone]. If 7PM-7AM, please contact night-coverage at [email or web address], password [facility] 03/07/2014, 9:32 AM  LOS: 2 days

## 2014-03-07 NOTE — Progress Notes (Signed)
Physical Therapy Treatment Patient Details Name: [name] MRN: [id number] DOB: 09-24-1940 Today's Date: 03/07/2014    History of Present Illness Patient is a 74 y/o female with PMH of CVA, HTN, chronic combined CHF, epilepsy and an active smoker presents with seizure-like symptoms. Pt had an episode of seizure-like activity at xray after which pt quickly returned to baseline and did not have any postictal state. s/p another seizure in ER lasting 1-2 mins, which pt remained postitcal. Head CT- (-).    PT Comments    Patient progressing slowly with mobility. More alert today however balance deficits noted during gait training with need for RW and Min A for safety. Drifting and staggering noted esp when distracted or with head turns. Recommend use of RW and hands on assist until balance improves. Will need to assess ability to negotiate steps next session. Will continue to follow.   Follow Up Recommendations  Home health PT;Supervision/Assistance - 24 hour     Equipment Recommendations  Other (comment)    Recommendations for Other Services       Precautions / Restrictions Precautions Precautions: Fall Restrictions Weight Bearing Restrictions: No    Mobility  Bed Mobility Overal bed mobility: Needs Assistance Bed Mobility: Supine to Sit     Supine to sit: Min assist;HOB elevated     General bed mobility comments: Min A to elevate trunk. Cues for technique. Increased time. Use of rails for support.  Transfers Overall transfer level: Needs assistance Equipment used: Rolling walker (2 wheeled);None Transfers: Sit to/from Stand Sit to Stand: Min guard         General transfer comment: Min guard for safety as pt impulsive upon standing.  Ambulation/Gait Ambulation/Gait assistance: Min assist Ambulation Distance (Feet): 150 Feet Assistive device: Rolling walker (2 wheeled) Gait Pattern/deviations: Narrow base of support;Step-through pattern;Scissoring;Drifts  right/left;Staggering left;Staggering right;Decreased step length - right;Decreased step length - left   Gait velocity interpretation: at or above normal speed for age/gender General Gait Details: Pt with slow and unsteady gait with drifting to left and right and staggering noted even when using RW. Easily distracted with head turns resulting in bumping into walker on right and left. Scrissoring gait pattern at times. Cues for  RW management. Sa02 ~95% on RA.   Stairs            Wheelchair Mobility    Modified Rankin (Stroke Patients Only)       Balance Overall balance assessment: Needs assistance Sitting-balance support: Feet supported;No upper extremity supported Sitting balance-Leahy Scale: Fair Sitting balance - Comments: Pt with LOB posteriorly after sitting on EOB but able to self recover and demonstrates appropriate balance reactions.    Standing balance support: During functional activity Standing balance-Leahy Scale: Poor                      Cognition Arousal/Alertness: Lethargic Behavior During Therapy: Impulsive;WFL for tasks assessed/performed Overall Cognitive Status: Impaired/Different from baseline Area of Impairment: Safety/judgement;Problem solving;Following commands;Orientation Orientation Level: Disoriented to;Time;Situation     Following Commands: Follows multi-step commands inconsistently Safety/Judgement: Decreased awareness of safety;Decreased awareness of deficits   Problem Solving: Slow processing;Requires verbal cues;Requires tactile cues General Comments: Pt more alert today but impulsive with mobility and requires repetition of cues.     Exercises      General Comments        Pertinent Vitals/Pain Pain Assessment: No/denies pain    Home Living  Prior Function            PT Goals (current goals can now be found in the care plan section) Progress towards PT goals: Progressing toward goals     Frequency  Min 3X/week    PT Plan Current plan remains appropriate    Co-evaluation             End of Session Equipment Utilized During Treatment: Gait belt Activity Tolerance: Patient tolerated treatment well Patient left: in bed;with call bell/phone within reach;with bed alarm set;with family/visitor present     Time: 9604-5409 PT Time Calculation (min) (ACUTE ONLY): 19 min  Charges:  $Gait Training: 8-22 mins                    G CodesAlvie Heidelberg A 04-01-14, 11:34 AM Alvie Heidelberg, PT, DPT [phone]

## 2014-03-07 NOTE — Progress Notes (Signed)
Have checked patient several times throughout the night and she was dry; at 5:00am still no urine present; performed bladder scan and got retention of 42ml.  Pt is receiving NS @ 71ml per hour; Contacted Dr. Clearence Ped and awaiting further orders.

## 2014-03-07 NOTE — Progress Notes (Signed)
Occupational Therapy Treatment Patient Details Name: [name] MRN: [id number] DOB: Feb 14, 1940 Today's Date: 03/07/2014    History of present illness Patient is a 74 y/o female with PMH of CVA, HTN, chronic combined CHF, epilepsy and an active smoker presents with seizure-like symptoms. Pt had an episode of seizure-like activity at xray after which pt quickly returned to baseline and did not have any postictal state. s/p another seizure in ER lasting 1-2 mins, which pt remained postitcal. Head CT- (-).   OT comments  Patient progressing towards goals, continue plan of care for now. Patient more awake today/less lethargic. Continue to recommend 24/7 supervision once discharged home with no follow-up OT at this time.   Follow Up Recommendations  No OT follow up;Supervision/Assistance - 24 hour    Equipment Recommendations  None recommended by OT    Recommendations for Other Services  None at this time    Precautions / Restrictions Precautions Precautions: Fall Restrictions Weight Bearing Restrictions: No       Mobility Bed Mobility Overal bed mobility: Needs Assistance Bed Mobility: Supine to Sit     Supine to sit: Supervision     General bed mobility comments: using bed rails  Transfers Overall transfer level: Needs assistance Equipment used: Rolling walker (2 wheeled) Transfers: Sit to/from Stand Sit to Stand: Supervision         General transfer comment: Patient took time, but with impulsive behaviors during sit<>stand transfers    Balance Overall balance assessment: Needs assistance Sitting-balance support: Feet supported;No upper extremity supported Sitting balance-Leahy Scale: Fair    Standing balance support: During functional activity Standing balance-Leahy Scale: Poor    ADL     Toilet Transfer: Supervision/safety;RW;Ambulation;BSC   Toileting- Clothing Manipulation and Hygiene: Supervision/safety;Sit to/from stand;Cueing for safety          General ADL Comments: Patient not as lethargic this session and able to keep eyes open during entire session. Patient able to engage in functional mobility with supervision using RW.          Perception Perception Perception Tested?: No   Praxis Praxis Praxis tested?: Within functional limits    Cognition   Behavior During Therapy: WFL for tasks assessed/performed Overall Cognitive Status: Impaired/Different from baseline Area of Impairment: Safety/judgement;Awareness;Memory Orientation Level: Disoriented to;Time;Situation   Memory: Decreased short-term memory  Following Commands: Follows multi-step commands with increased time Safety/Judgement: Decreased awareness of safety;Decreased awareness of deficits Awareness: Emergent Problem Solving: Slow processing;Requires verbal cues;Requires tactile cues General Comments: Pt more alert today but impulsive with mobility and requires repetition of cues.                  Pertinent Vitals/ Pain       Pain Assessment: No/denies pain         Frequency Min 2X/week     Progress Toward Goals  OT Goals(current goals can now be found in the care plan section)  Progress towards OT goals: Progressing toward goals     Plan Discharge plan needs to be updated       End of Session Equipment Utilized During Treatment: Rolling walker   Activity Tolerance Patient tolerated treatment well   Patient Left in chair;with call bell/phone within reach;with chair alarm set;with family/visitor present     Time: 7564-3329 OT Time Calculation (min): 12 min  Charges: OT General Charges $OT Visit: 1 Procedure OT Treatments $Self Care/Home Management : 8-22 mins  Amiaya Mcneeley , MS, OTR/L, CLT Pager: [phone]  03/07/2014, 2:32 PM

## 2014-03-08 ENCOUNTER — Inpatient Hospital Stay (HOSPITAL_COMMUNITY): Payer: Commercial Managed Care - HMO

## 2014-03-08 LAB — GLUCOSE, CAPILLARY
Glucose-Capillary: 103 mg/dL — ABNORMAL HIGH (ref 70–99)
Glucose-Capillary: 111 mg/dL — ABNORMAL HIGH (ref 70–99)
Glucose-Capillary: 123 mg/dL — ABNORMAL HIGH (ref 70–99)
Glucose-Capillary: 94 mg/dL (ref 70–99)
Glucose-Capillary: 95 mg/dL (ref 70–99)

## 2014-03-08 LAB — CK: Total CK: 2711 U/L — ABNORMAL HIGH (ref 7–177)

## 2014-03-08 LAB — BASIC METABOLIC PANEL
Anion gap: 5 (ref 5–15)
BUN: 23 mg/dL (ref 6–23)
CO2: 21 mmol/L (ref 19–32)
Calcium: 8.2 mg/dL — ABNORMAL LOW (ref 8.4–10.5)
Chloride: 112 mmol/L (ref 96–112)
Creatinine, Ser: 1.62 mg/dL — ABNORMAL HIGH (ref 0.50–1.10)
GFR calc Af Amer: 36 mL/min — ABNORMAL LOW (ref >90)
GFR calc non Af Amer: 31 mL/min — ABNORMAL LOW (ref >90)
Glucose, Bld: 93 mg/dL (ref 70–99)
Potassium: 5 mmol/L (ref 3.5–5.1)
Sodium: 138 mmol/L (ref 135–145)

## 2014-03-08 MED ORDER — SODIUM POLYSTYRENE SULFONATE 15 GM/60ML PO SUSP
30.0000 g | Freq: Once | ORAL | Status: AC
  Administered 2014-03-08: 30 g via ORAL
  Filled 2014-03-08: qty 120

## 2014-03-08 MED ORDER — LACOSAMIDE 50 MG PO TABS
100.0000 mg | ORAL_TABLET | Freq: Two times a day (BID) | ORAL | Status: DC
  Administered 2014-03-08 – 2014-03-09 (×3): 100 mg via ORAL
  Filled 2014-03-08 (×3): qty 2

## 2014-03-08 MED ORDER — LEVETIRACETAM 750 MG PO TABS
750.0000 mg | ORAL_TABLET | Freq: Two times a day (BID) | ORAL | Status: DC
  Administered 2014-03-08 – 2014-03-09 (×3): 750 mg via ORAL
  Filled 2014-03-08 (×3): qty 1

## 2014-03-08 MED ORDER — SODIUM CHLORIDE 0.9 % IV SOLN
INTRAVENOUS | Status: DC
  Administered 2014-03-08: 14:00:00 via INTRAVENOUS

## 2014-03-08 NOTE — Progress Notes (Signed)
[facility] PROGRESS NOTE  Kyonna Brem [id number]:[id number] DOB: 1940-05-28 DOA: 03/05/2014   PCP: [name], MD  Brief history of present illness  Patient is a 74 year old female with a CVA, hypertension, chronic combined CHF, epilepsy, active smoker presented with seizure-like episode. Patient was at her baseline until the day of admission. On the day of admission, she fell out of the bed on the morning and was on the floor and did not remember how long she was on the floor. While the patient was in the x-ray, she had an episode of seizure-like activity after which patient quickly returned to the baseline. She was suspected to have a UTI and was treated with Rocephin, was discharged on oral Keflex. While she was in the waiting room, she had another seizure activity lasted for 1-2 minutes, was brought back to the ED. Patient was admitted for further workup.   Assessment/Plan:  Seizure disorder with breakthrough seizures:  Neurology was consulted. Patient was loaded with Keppra and Keppra dose was increased to 750 MG twice a day. Continue Vimpat.  MRI of the brain did not show any mass or acute stroke.  UTI Continue IV Rocephin, follow urine culture and sensitivities  Rib pain -Per patient she fell and since then she's been hurting on her 'sides, ordered rib x-rays to assess for any fractures - Add pain medications, placed on Tylenol ES and percocet. No tramadol due to seizures  Essential hypertension: Mildly uncontrolled. Continue carvedilol which will also help mild tachycardia.  Chronic systolic CHF: LVEF 12/29/12: 35-40 percent with diffuse hypokinesis. Compensated.  Lasix temporarily on hold due to creatinine of 1.6.   Acute on Stage III chronic kidney disease: - Renal ultrasound showed no obstruction or hydronephrosis, hold Lasix, gentle hydration for 1 L and then saline lock   Mild rhabdomyolysis: Secondary to seizures and fall, CK slightly trending up today, placed  on gentle hydration.   DVT prophylaxis heparin subcutaneous  Code Status: full  Family Communication: d/w patient and her daughter at the bedside  Disposition Plan: home 24-48 hrs, but are interested in home PT, will order PT evaluation    Consultants:  Neurology   Procedures:  none  Antibiotics:  Ceftriaxone 2/5>    Subjective: Patient is alert and oriented, complaining of pain on the ribs, generalized weakness. Daughter at the bedside  Objective: BP 128/82 mmHg  Pulse 97  Temp(Src) 98.4 F (36.9 C) (Oral)  Resp 16  Ht 5' (1.524 m)  Wt 37.014 kg (81 lb 9.6 oz)  BMI 15.94 kg/m2  SpO2 99%   No intake or output data in the 24 hours ending 03/08/14 1137 Filed Weights   03/05/14 1815 03/05/14 2205  Weight: 36.288 kg (80 lb) 37.014 kg (81 lb 9.6 oz)    Exam:   General:  Alert and oriented 3, NAD, uncomfortable due to right-sided rib pain  Cardiovascular: s1,s2 clear,RRR  chest: CTA BL  Abdomen: soft, nt,nd   ext: no cyanosis clubbing or edema   Data Reviewed: Basic Metabolic Panel:  Recent Labs Lab 03/05/14 1547 03/05/14 1811 03/06/14 0606 03/07/14 0550 03/08/14 0547  NA 141 142 137 138 138  K 5.5* 4.1 3.9 4.1 5.0  CL 110 110 111 113* 112  CO2 22 11* 18* 21 21  GLUCOSE 107* 171* 109* 101* 93  BUN 44* 38* 22 20 23   CREATININE 1.64* 1.58* 1.25* 1.77* 1.62*  CALCIUM 9.8 9.6 8.8 8.7 8.2*   Liver Function Tests:  Recent Labs Lab 03/05/14 2254 03/06/14  0606  AST 33 30  ALT 27 25  ALKPHOS 76 71  BILITOT 0.3 0.7  PROT 7.6 7.5  ALBUMIN 3.8 3.8   No results for input(s): LIPASE, AMYLASE in the last 168 hours.  Recent Labs Lab 03/06/14 0606  AMMONIA 26   CBC:  Recent Labs Lab 03/05/14 1547 03/06/14 0606 03/07/14 0550  WBC 10.1 10.3 6.8  NEUTROABS 8.4* 7.9*  --   HGB 11.7* 10.7* 10.8*  HCT 34.6* 31.3* 33.0*  MCV 95.6 94.0 98.5  PLT 246 236 212   Cardiac Enzymes:  Recent Labs Lab 03/06/14 0606 03/07/14 0550  03/08/14 0547  CKTOTAL 1640* 1093* 2711*   BNP (last 3 results) No results for input(s): BNP in the last 8760 hours.  ProBNP (last 3 results) No results for input(s): PROBNP in the last 8760 hours.  CBG:  Recent Labs Lab 03/07/14 0618 03/07/14 1137 03/08/14 0019 03/08/14 0606 03/08/14 1106  GLUCAP 92 85 95 94 103*    Recent Results (from the past 240 hour(s))  Urine culture     Status: None   Collection Time: 03/05/14  4:13 PM  Result Value Ref Range Status   Specimen Description URINE, CLEAN CATCH  Final   Special Requests Normal  Final   Colony Count   Final    >=100,000 COLONIES/ML Performed at [facility]    Culture   Final    Multiple bacterial morphotypes present, none predominant. Suggest appropriate recollection if clinically indicated. Performed at [facility]    Report Status 03/06/2014 FINAL  Final     Studies: Dg Ribs Bilateral  03/08/2014   CLINICAL DATA:  74 year old female with posterior chest wall and rib pain after falling in her home during an episode of seizure activity. Subsequent encounter.  EXAM: BILATERAL RIBS - 3+ VIEW  COMPARISON:  Recent prior chest x-ray and rib series 03/05/2014  FINDINGS: Cardiomegaly. Atherosclerotic calcifications in the tortuous thoracic aorta. No evidence of pneumothorax or pulmonary edema. Mild bibasilar atelectasis. No evidence of acute rib fracture. Remote T9 compression fracture with approximately 60% height loss anteriorly. Dextro convex scoliosis of the thoracic spine centered at T9.  IMPRESSION: 1. No acute fracture or malalignment. 2. Remote T9 compression fracture. 3. Slightly increased bibasilar atelectasis may be related to splinting. 4. Stable cardiomegaly.   Electronically Signed   By: Malachy Moan M.D.   On: 03/08/2014 10:06   Mr Brain Wo Contrast  03/06/2014   CLINICAL DATA:  Fall from bed onto the floor. The patient is amnestic of the event and does not know how long she was on the floor.  Seizure-like activity. Personal history of seizures. Personal history of cerebral infarct.  EXAM: MRI HEAD WITHOUT CONTRAST  TECHNIQUE: Multiplanar, multiecho pulse sequences of the brain and surrounding structures were obtained without intravenous contrast.  COMPARISON:  CT head without contrast 03/05/2014. MRI brain 12/28/2012.  FINDINGS: The diffusion-weighted images demonstrate no evidence for acute or subacute infarction. Extensive confluent periventricular and subcortical T2 changes are evident bilaterally. A remote hemorrhagic infarct is noted along the medial posterior left frontal lobe with associated encephalomalacia. A remote lacunar infarct is again noted within the right basal ganglia. There is diffuse signal abnormality within the thalami bilaterally, worse on the left. A remote posterior right cerebellar lacunar infarct is present.  No acute hemorrhage or mass lesion is present. The ventricles are proportionate to the degree of atrophy.  No significant extra-axial fluid collection is present. The left lens has been replaced. The  globes and orbits are otherwise within normal limits. Circumferential mucosal thickening is present in the left sphenoid sinus. The remaining paranasal sinuses are clear. There is some fluid in the left mastoid air cells. No obstructing nasopharyngeal lesion is evident.  IMPRESSION: 1. No acute intracranial abnormality or significant interval change. 2. Stable atrophy and diffuse white matter disease. This likely reflects the sequela of chronic microvascular ischemia. 3. Chronic left sphenoid sinus disease. 4. Fluid in left mastoid air cells without obstructive nasopharyngeal lesion.   Electronically Signed   By: Gennette Pac M.D.   On: 03/06/2014 21:07   Dg Shoulder Left  03/07/2014   CLINICAL DATA:  Status post fall during seizure activity, persistent pain in the posterior aspect of the left shoulder.  EXAM: LEFT SHOULDER - 2+ VIEW  COMPARISON:  Chest X ray of March 05, 2014 and chest x-ray of February 25, 2013  FINDINGS: The bones of the left shoulder are adequately mineralized. The glenohumeral and AC joints are grossly intact. The acromion and coracoid are intact where visualized. The observed portions of the left clavicle and upper left ribs are normal. The body of the scapula is grossly normal. There is chronic partial compression of the body of T7.  IMPRESSION: There is no acute bony abnormality of the left shoulder.   Electronically Signed   By: David  [place]   On: 03/07/2014 07:49    Scheduled Meds: . aspirin  325 mg Oral q morning - 10a  . carvedilol  3.125 mg Oral BID WC  . cefTRIAXone (ROCEPHIN)  IV  1 g Intravenous Q24H  . heparin  5,000 Units Subcutaneous 3 times per day  . lacosamide  100 mg Oral BID  . levETIRAcetam  750 mg Oral BID  . sodium polystyrene  30 g Oral Once   Continuous Infusions: . sodium chloride     Time spent 25 minutes  Kani Jobson M.D. [facility] 03/08/2014, 11:45 AM  Pager: [phone]

## 2014-03-08 NOTE — Progress Notes (Signed)
Speech Language Pathology Treatment: Dysphagia;Cognitive-Linquistic  Patient Details Name: [name] MRN: [id number] DOB: October 10, 1940 Today's Date: 03/08/2014 Time: 6168-3729 SLP Time Calculation (min) (ACUTE ONLY): 23 min  Assessment / Plan / Recommendation Clinical Impression  Diet advanced to regular yesterday and today's visit it to determine tolerance and provide cognitive treatment.  Lelon Mast, pt's friend and caregiver present.  Both report great tolerance of meal without dysphagia symptoms.    Pt reports she and Samantha manage her pills using Sun through Sat pill container.  Reviewed importance of assuring medications are taken as directed.  In discussing managing appointments, pt states she writes them down.  Samantha verbalized "sometimes she forgets"   During SLP session, pt repeated several times re: change in her kidney medication and impact on her health as well as MD office informing her to take one a day then one every other day.    Provided pt and caregiver with written compensation strategies for functional use.    Given pt lives alone and has intermittent assist from [place], [place] highly recommends follow up with [facility] to set up home management program re: ADLs - medications, appointments, finances, etc.  Pt agreeable to plan.  Will sign off acutely as HH will aid in generalization of strategies.    HPI HPI: 74 yo female with history of bilateral cerebral infarctions, congestive heart failure, seizure disorder, myocardial infarction and hypertension who presents with break through seizure in setting of UTI.  Pt has undergone swallow and cognitive evaluations.  Diet advanced to regular yesterday and today's visit it to determine tolerance and provide cognitive treatment.     Pertinent Vitals Pain Assessment: No/denies pain  SLP Plan  Discharge SLP treatment due to (comment) (all education completed in acute settinng, recommend follow up with [facility] for generalization)     Recommendations Diet recommendations: Regular;Thin liquid Liquids provided via: Cup;Straw Medication Administration: Whole meds with liquid Supervision: Patient able to self feed;Intermittent supervision to cue for compensatory strategies Compensations: Slow rate;Small sips/bites Postural Changes and/or Swallow Maneuvers: Seated upright 90 degrees              Oral Care Recommendations: Oral care BID Follow up Recommendations: Home health SLP;24 hour supervision/assistance Plan: Discharge SLP treatment due to (comment) (all education completed in acute settinng, recommend follow up with [facility] for generalization)    GO     Donavan Burnet, MS [facility] SLP [phone]

## 2014-03-08 NOTE — Care Management Note (Signed)
    Page 1 of 1   03/08/2014     10:08:10 AM CARE MANAGEMENT NOTE 03/08/2014  Patient:  [facility]   Account Number:  [id number]  Date Initiated:  03/06/2014  Documentation initiated by:  Jiles Crocker  Subjective/Objective Assessment:   ADMITTED WITH SEIZURE     Action/Plan:   CM FOLLOWING FOR DCP   Anticipated DC Date:  03/08/2014   Anticipated DC Plan:  HOME W HOME HEALTH SERVICES      DC Planning Services  CM consult      Choice offered to / List presented to:             Status of service:  In process, will continue to follow Medicare Important Message given?   (If response is "NO", the following Medicare IM given date fields will be blank) Date Medicare IM given:   Medicare IM given by:   Date Additional Medicare IM given:   Additional Medicare IM given by:    Discharge Disposition:    Per UR Regulation:  Reviewed for med. necessity/level of care/duration of stay  If discussed at Long Length of Stay Meetings, dates discussed:    Comments:  03/08/14 10:00 CM has requested MD to please place home health orders and face to face.  Once orders placed, CM will arrange. Waiting orders.  Freddy Jaksch, BSN, CM [phone].  2/4/2016Abelino Derrick RN,BSN,MHA [phone]

## 2014-03-09 LAB — BASIC METABOLIC PANEL
Anion gap: 7 (ref 5–15)
BUN: 14 mg/dL (ref 6–23)
CO2: 25 mmol/L (ref 19–32)
Calcium: 8.6 mg/dL (ref 8.4–10.5)
Chloride: 109 mmol/L (ref 96–112)
Creatinine, Ser: 1.25 mg/dL — ABNORMAL HIGH (ref 0.50–1.10)
GFR calc Af Amer: 49 mL/min — ABNORMAL LOW (ref >90)
GFR calc non Af Amer: 42 mL/min — ABNORMAL LOW (ref >90)
Glucose, Bld: 104 mg/dL — ABNORMAL HIGH (ref 70–99)
Potassium: 4 mmol/L (ref 3.5–5.1)
Sodium: 141 mmol/L (ref 135–145)

## 2014-03-09 LAB — URINE CULTURE: Colony Count: 40000

## 2014-03-09 MED ORDER — CARVEDILOL 6.25 MG PO TABS: 6.2500 mg | ORAL_TABLET | Freq: Two times a day (BID) | ORAL | Status: DC

## 2014-03-09 MED ORDER — LEVETIRACETAM 750 MG PO TABS: 750.0000 mg | ORAL_TABLET | Freq: Two times a day (BID) | ORAL | Status: DC

## 2014-03-09 MED ORDER — HYDROCODONE-ACETAMINOPHEN 5-325 MG PO TABS: 1.0000 | ORAL_TABLET | Freq: Four times a day (QID) | ORAL | Status: DC | PRN

## 2014-03-09 MED ORDER — FUROSEMIDE 20 MG PO TABS: 40.0000 mg | ORAL_TABLET | Freq: Every day | ORAL | Status: DC

## 2014-03-09 MED ORDER — LEVOFLOXACIN 250 MG PO TABS: 250.0000 mg | ORAL_TABLET | Freq: Every day | ORAL | Status: DC

## 2014-03-09 NOTE — Progress Notes (Signed)
Patient and her daughter provided DC instructions and prescriptions. All questions answered. Both verbalize understanding and compliance. Patient in [facility] to be escorted by staff to lobby. Daughter will transport patient home in private vehicle.

## 2014-03-09 NOTE — Progress Notes (Signed)
CARE MANAGEMENT NOTE 03/09/2014  Patient:  [name], [name]   Account Number:  [id number]  Date Initiated:  03/06/2014  Documentation initiated by:  Olga Coaster  Subjective/Objective Assessment:   ADMITTED WITH SEIZURE     Action/Plan:   CM FOLLOWING FOR DCP   Anticipated DC Date:  03/08/2014   Anticipated DC Plan:  [place]  CM consult      [facility] Choice  HOME HEALTH   Choice offered to / List presented to:  C-1 Patient        [place] arranged  [place].   Status of service:  Completed, signed off Medicare Important Message given?   (If response is "NO", the following Medicare IM given date fields will be blank) Date Medicare IM given:   Medicare IM given by:   Date Additional Medicare IM given:   Additional Medicare IM given by:    Discharge Disposition:  [place]  Per UR Regulation:  Reviewed for med. necessity/level of care/duration of stay  If discussed at [place] of Stay Meetings, dates discussed:    Comments:  03/09/14 11:22 CM met with pt in room to offer choice of home agency.  Pt chooses [facility]  to render HHPT/OT/aide.  Address and contact information verified by pt.  Referral called to [facility] rep, Stephanie.  No other CM needs were communicated. Mariane Masters, BSN, [place].  03/08/14 10:00 CM has requested MD to please place home health orders and face to face.  Once orders placed, CM will arrange. Waiting orders.  Mariane Masters, [place], [place].  2/4/2016Mindi Slicker RN,BSN,MHA [phone]

## 2014-03-09 NOTE — Progress Notes (Addendum)
Addendum found after patient discharged. Prescription and updated DC summary provided to and signed by patient, prior to exiting unit.

## 2014-03-09 NOTE — Discharge Summary (Signed)
Physician Discharge Summary  Patient ID: [name] MRN: [id number] DOB/AGE: 1940/02/21 74 y.o.  Admit date: 03/05/2014 Discharge date: 03/09/2014  Primary Care Physician:  [name], MD  Discharge Diagnoses:    . acute encephalopathy   Urinary tract infection   Seizures  Acute kidney injury  . Smoker . Hypertension . Chronic combined systolic and diastolic CHF (congestive heart failure)  Consults: Neurology   Recommendations for Outpatient Follow-up:  Patient will benefit from outpatient neurology referral for seizures, Keppra increased to  BID, continue Vimpat  Home PT, OT, home health aide, RN arranged   DIET: Heart healthy diet  Allergies:  No Known Allergies   Discharge Medications:   Medication List    STOP taking these medications        cephALEXin 500 MG capsule  Commonly known as:  KEFLEX     traMADol 50 MG tablet  Commonly known as:  ULTRAM      TAKE these medications        aspirin 325 MG tablet  Take 325 mg by mouth every morning.     calcitRIOL 0.25 MCG capsule  Commonly known as:  ROCALTROL  Take 0.25 mcg by mouth daily.     carvedilol 6.25 MG tablet  Commonly known as:  COREG  Take 1 tablet (6.25 mg total) by mouth 2 (two) times daily with a meal.     furosemide 20 MG tablet  Commonly known as:  LASIX  Take 2 tablets (40 mg total) by mouth daily.  Start taking on:  03/10/2014     HYDROcodone-acetaminophen 5-325 MG per tablet  Commonly known as:  NORCO/VICODIN  Take 1 tablet by mouth every 6 (six) hours as needed for moderate pain or severe pain.     Lacosamide 100 MG Tabs  Commonly known as:  VIMPAT  Take 1 tablet (100 mg total) by mouth 2 (two) times daily.     levETIRAcetam 750 MG tablet  Commonly known as:  KEPPRA  Take 1 tablet (750 mg total) by mouth 2 (two) times daily.     levofloxacin 250 MG tablet  Commonly known as:  LEVAQUIN  Take 1 tablet (250 mg total) by mouth daily. X 3days  Start taking on:   03/10/2014         Brief H and P: For complete details please refer to admission H and P, but in briefPatient is a 74 year old female with a CVA, hypertension, chronic combined CHF, epilepsy, active smoker presented with seizure-like episode. Patient was at her baseline until the day of admission. On the day of admission, she fell out of the bed on the morning and was on the floor and did not remember how long she was on the floor. While the patient was in the x-ray, she had an episode of seizure-like activity after which patient quickly returned to the baseline. She was suspected to have a UTI and was treated with Rocephin, was discharged on oral Keflex. While she was in the waiting room, she had another seizure activity lasted for 1-2 minutes, was brought back to the ED. Patient was admitted for further workup.   Hospital Course:  Seizure disorder with breakthrough seizures:  Neurology was consulted. Patient was loaded with Keppra and Keppra dose was increased to 750 MG twice a day. Continue Vimpat at  BID.  MRI of the brain did not show any mass or acute stroke.  UTI Patient was placed on IV Rocephin, urine culture and sensitivity showed more than 100,000  colonies of multiple bacterial morphotypes, repeat urine culture was sent, still in process. Patient is now back to her baseline, alert and oriented, changed to oral Levaquin for another 3 days. Patient was on Keflex prior to admission.  Rib pain -Per patient she fell and since then she's been hurting on her 'sides, ordered rib x-rays to assess for any fractures which was negative for any acute rib fractures. Placed on norco (given prescription for #30, no refills ), No tramadol due to seizures  Essential hypertension: Mildly uncontrolled. Continue carvedilol, increased to 6.25mg  BID  which will also help mild tachycardia.  Chronic systolic CHF: LVEF 12/29/12: 35-40 percent with diffuse hypokinesis. Compensated.  Lasix temporarily on  hold due to creatinine of 1.6.   Acute on Stage III chronic kidney disease: - Renal ultrasound showed no obstruction or hydronephrosis, Lasix was temporarily held, patient was given gentle hydration, creatinine has improved to 1.2.   Mild rhabdomyolysis: Secondary to seizures and fall, patient received IV hydration,   Home PT, OT, RN, home health aide were arranged. Patient will be staying with her daughter.  Day of Discharge BP 144/94 mmHg  Pulse 110  Temp(Src) 98.4 F (36.9 C) (Oral)  Resp 16  Ht 5' (1.524 m)  Wt 37.014 kg (81 lb 9.6 oz)  BMI 15.94 kg/m2  SpO2 97%  Physical Exam: General: Alert and awake oriented x3 not in any acute distress. CVS: S1-S2 clear no murmur rubs or gallops Chest: clear to auscultation bilaterally, no wheezing rales or rhonchi Abdomen: soft nontender, nondistended, normal bowel sounds Extremities: no cyanosis, clubbing or edema noted bilaterally Neuro: Cranial nerves II-XII intact, no focal neurological deficits   The results of significant diagnostics from this hospitalization (including imaging, microbiology, ancillary and laboratory) are listed below for reference.    LAB RESULTS: Basic Metabolic Panel:  Recent Labs Lab 03/08/14 0547 03/09/14 0625  NA 138 141  K 5.0 4.0  CL 112 109  CO2 21 25  GLUCOSE 93 104*  BUN 23 14  CREATININE 1.62* 1.25*  CALCIUM 8.2* 8.6   Liver Function Tests:  Recent Labs Lab 03/05/14 2254 03/06/14 0606  AST 33 30  ALT 27 25  ALKPHOS 76 71  BILITOT 0.3 0.7  PROT 7.6 7.5  ALBUMIN 3.8 3.8   No results for input(s): LIPASE, AMYLASE in the last 168 hours.  Recent Labs Lab 03/06/14 0606  AMMONIA 26   CBC:  Recent Labs Lab 03/06/14 0606 03/07/14 0550  WBC 10.3 6.8  NEUTROABS 7.9*  --   HGB 10.7* 10.8*  HCT 31.3* 33.0*  MCV 94.0 98.5  PLT 236 212   Cardiac Enzymes:  Recent Labs Lab 03/07/14 0550 03/08/14 0547  CKTOTAL 1093* 2711*   BNP: Invalid input(s): POCBNP CBG:  Recent  Labs Lab 03/08/14 1638 03/08/14 2139  GLUCAP 111* 123*    Significant Diagnostic Studies:  Dg Ribs Unilateral W/chest Right  03/05/2014   CLINICAL DATA:  Right chest pain. Fall. Posterior right lower rib pain.  EXAM: RIGHT RIBS AND CHEST - 3+ VIEW  COMPARISON:  Chest x-ray 02/25/2013  FINDINGS: Heart is mildly enlarged. Lungs are clear. No effusions or pneumothorax. No acute bony abnormality. No visible rib fracture.  IMPRESSION: No acute findings.   Electronically Signed   By: Charlett Nose M.D.   On: 03/05/2014 14:25   Ct Head Wo Contrast  03/05/2014   CLINICAL DATA:  74 year old female found down. Fall, seizure. Initial encounter.  EXAM: CT HEAD WITHOUT CONTRAST  TECHNIQUE: Contiguous  axial images were obtained from the base of the skull through the vertex without intravenous contrast.  COMPARISON:  Head CT without contrast 02/11/2013 and earlier.  FINDINGS: Chronic sphenoid sinusitis. Stable paranasal sinuses and mastoids. No acute osseous abnormality identified.  No acute orbit or scalp soft tissue finding identified.  Calcified atherosclerosis at the skull base. Cerebral volume not significantly changed. Chronic white matter hypodensity, maximal at the right frontal horn. No midline shift, mass effect, or evidence of intracranial mass lesion. No ventriculomegaly. No midline shift, mass effect, or evidence of intracranial mass lesion. Dural calcifications. No acute intracranial hemorrhage identified. No evidence of cortically based acute infarction identified. No suspicious intracranial vascular hyperdensity.  IMPRESSION: No acute intracranial abnormality. Chronic white matter changes likely due to small vessel ischemia. Chronic sphenoid sinusitis.   Electronically Signed   By: Augusto Gamble M.D.   On: 03/05/2014 15:39   Mr Brain Wo Contrast  03/06/2014   CLINICAL DATA:  Fall from bed onto the floor. The patient is amnestic of the event and does not know how long she was on the floor. Seizure-like  activity. Personal history of seizures. Personal history of cerebral infarct.  EXAM: MRI HEAD WITHOUT CONTRAST  TECHNIQUE: Multiplanar, multiecho pulse sequences of the brain and surrounding structures were obtained without intravenous contrast.  COMPARISON:  CT head without contrast 03/05/2014. MRI brain 12/28/2012.  FINDINGS: The diffusion-weighted images demonstrate no evidence for acute or subacute infarction. Extensive confluent periventricular and subcortical T2 changes are evident bilaterally. A remote hemorrhagic infarct is noted along the medial posterior left frontal lobe with associated encephalomalacia. A remote lacunar infarct is again noted within the right basal ganglia. There is diffuse signal abnormality within the thalami bilaterally, worse on the left. A remote posterior right cerebellar lacunar infarct is present.  No acute hemorrhage or mass lesion is present. The ventricles are proportionate to the degree of atrophy.  No significant extra-axial fluid collection is present. The left lens has been replaced. The globes and orbits are otherwise within normal limits. Circumferential mucosal thickening is present in the left sphenoid sinus. The remaining paranasal sinuses are clear. There is some fluid in the left mastoid air cells. No obstructing nasopharyngeal lesion is evident.  IMPRESSION: 1. No acute intracranial abnormality or significant interval change. 2. Stable atrophy and diffuse white matter disease. This likely reflects the sequela of chronic microvascular ischemia. 3. Chronic left sphenoid sinus disease. 4. Fluid in left mastoid air cells without obstructive nasopharyngeal lesion.   Electronically Signed   By: Gennette Pac M.D.   On: 03/06/2014 21:07   US Renal  03/06/2014   CLINICAL DATA:  Flank pain, uric acid crystal uria  EXAM: RENAL/URINARY TRACT ULTRASOUND COMPLETE  COMPARISON:  None.  FINDINGS: Right Kidney:  Length: 9.5 cm. There is mild increased echotexture of the right  kidney. No mass visualized. There is minimal prominence of right renal pelvis.  Left Kidney:  Length: 9.5. Echogenicity within normal limits. No mass or hydronephrosis visualized.  Bladder:  Decompressed limiting evaluation  IMPRESSION: Mild increased echotexture of the right kidney. This is nonspecific but can be seen in medical renal disease.   Electronically Signed   By: Sherian Rein M.D.   On: 03/06/2014 11:12   Dg Abd 2 Views  03/05/2014   CLINICAL DATA:  Abdominal pain. Two seizures today. History of stroke.  EXAM: ABDOMEN - 2 VIEW  COMPARISON:  Abdominal radiograph February 24, 2013  FINDINGS: The bowel gas pattern is normal. There is  no evidence of free air. Phleboliths in the pelvis. No suspicious radio-opaque calculi or other significant radiographic abnormality is seen.  IMPRESSION: Normal bowel gas pattern.   Electronically Signed   By: Awilda Metro   On: 03/05/2014 22:01    2D ECHO:   Disposition and Follow-up: Discharge Instructions    Diet - low sodium heart healthy    Complete by:  As directed      Increase activity slowly    Complete by:  As directed             DISPOSITION: Home with home PT, OT, home health aide   DISCHARGE FOLLOW-UP Follow-up Information    Follow up with [facility].   Why:  home health physical and occupational therapy and aide   Contact information:   [address] [place] [address] [phone]       Follow up with [name], MD. Schedule an appointment as soon as possible for a visit in 10 days.   Specialty:  Family Medicine   Why:  for hospital follow-up   Contact information:   [address] [place] [address] [phone]        Time spent on Discharge: 35 minutes  Signed:   RAI,RIPUDEEP M.D. [facility] 03/09/2014, 12:00 PM Pager: [phone]

## 2014-03-10 NOTE — ED Provider Notes (Signed)
CSN: [id number]     Arrival date & time 03/05/14  1228 History   First MD Initiated Contact with Patient 03/05/14 1248     No chief complaint on file.    (Consider location/radiation/quality/duration/timing/severity/associated sxs/prior Treatment) HPI   74 year old female with right chest pain after a fall. She is not completely sure why she fell, but thinks she may have had a seizure. History the same. Reports compliance with her medications. Pain is in the right chest. Does not radiate. Worse with movement, anything touching it and inspiration. She's not sure she hit her head or not. Denies any steroid headache neck or back pain. No fevers or chills. No shortness of breath.  Past Medical History  Diagnosis Date  . MI (myocardial infarction)   . Hypertension   . Stroke   . Congestive heart failure (CHF)     EF 35% Nov 2014  . Epilepsy   . Smoker    History reviewed. No pertinent past surgical history. Family History  Problem Relation Age of Onset  . Cancer Father   . Anemia Daughter    History  Substance Use Topics  . Smoking status: Current Some Day Smoker    Types: Cigarettes  . Smokeless tobacco: Not on file  . Alcohol Use: Yes     Comment: occasional   OB History    No data available     Review of Systems  All systems reviewed and negative, other than as noted in HPI.   Allergies  Review of patient's allergies indicates no known allergies.  Home Medications   Prior to Admission medications   Medication Sig Start Date End Date Taking? Authorizing Provider  aspirin 325 MG tablet Take 325 mg by mouth every morning.    Yes Historical Provider, MD  calcitRIOL (ROCALTROL) 0.25 MCG capsule Take 0.25 mcg by mouth daily.   Yes Historical Provider, MD  Lacosamide (VIMPAT) 100 MG TABS Take 1 tablet (100 mg total) by mouth 2 (two) times daily. 10/16/13  Yes Delia Heady, MD  carvedilol (COREG) 6.25 MG tablet Take 1 tablet (6.25 mg total) by mouth 2 (two) times daily with  a meal. 03/09/14   Ripudeep Jenna Luo, MD  furosemide (LASIX) 20 MG tablet Take 2 tablets (40 mg total) by mouth daily. 03/10/14   Ripudeep Jenna Luo, MD  HYDROcodone-acetaminophen (NORCO/VICODIN) 5-325 MG per tablet Take 1 tablet by mouth every 6 (six) hours as needed for moderate pain or severe pain. 03/09/14   Ripudeep Jenna Luo, MD  levETIRAcetam (KEPPRA) 750 MG tablet Take 1 tablet (750 mg total) by mouth 2 (two) times daily. 03/09/14   Ripudeep Jenna Luo, MD  levofloxacin (LEVAQUIN) 250 MG tablet Take 1 tablet (250 mg total) by mouth daily. X 3days 03/10/14   Ripudeep K Rai, MD   BP 160/90 mmHg  Pulse 95  Temp(Src) 97.6 F (36.4 C) (Oral)  Resp 24  SpO2 100% Physical Exam  Constitutional: She is oriented to person, place, and time. She appears well-developed and well-nourished. No distress.  HENT:  Head: Normocephalic and atraumatic.  Eyes: Conjunctivae are normal. Right eye exhibits no discharge. Left eye exhibits no discharge.  Neck: Neck supple.  Cardiovascular: Normal rate, regular rhythm and normal heart sounds.  Exam reveals no gallop and no friction rub.   No murmur heard. Pulmonary/Chest: Effort normal and breath sounds normal. No respiratory distress.  Abdominal: Soft. She exhibits no distension. There is no tenderness.  Musculoskeletal: She exhibits no edema or tenderness.  No  midline spinal tenderness. Tenderness to palpation over the right lateral chest wall. No crepitus. No overlying skin changes. No bony tenderness the extremities are apparent pain with range of motion of the large joints.  Neurological: She is alert and oriented to person, place, and time. No cranial nerve deficit. She exhibits normal muscle tone. Coordination normal.  Speech clear. Content appropriate. Follows commands. Cranial nerves II through XII are intact. Strength is 5 out of 5 bilateral upper lower extremities. Good finger to nose testing bilaterally.  Skin: Skin is warm and dry.  Psychiatric: She has a normal mood and  affect. Her behavior is normal. Thought content normal.  Nursing note and vitals reviewed.   ED Course  Procedures (including critical care time) Labs Review Labs Reviewed  CBC WITH DIFFERENTIAL/PLATELET - Abnormal; Notable for the following:    RBC 3.62 (*)    Hemoglobin 11.7 (*)    HCT 34.6 (*)    Neutrophils Relative % 83 (*)    Neutro Abs 8.4 (*)    Lymphocytes Relative 7 (*)    All other components within normal limits  BASIC METABOLIC PANEL - Abnormal; Notable for the following:    Potassium 5.5 (*)    Glucose, Bld 107 (*)    BUN 44 (*)    Creatinine, Ser 1.64 (*)    GFR calc non Af Amer 30 (*)    GFR calc Af Amer 35 (*)    All other components within normal limits  URINALYSIS, ROUTINE W REFLEX MICROSCOPIC - Abnormal; Notable for the following:    APPearance HAZY (*)    Hgb urine dipstick TRACE (*)    Protein, ur 30 (*)    Leukocytes, UA SMALL (*)    All other components within normal limits  URINE MICROSCOPIC-ADD ON - Abnormal; Notable for the following:    Bacteria, UA MANY (*)    Crystals URIC ACID CRYSTALS (*)    All other components within normal limits  URINE CULTURE    Imaging Review Dg Ribs Bilateral  03/08/2014   CLINICAL DATA:  74 year old female with posterior chest wall and rib pain after falling in her home during an episode of seizure activity. Subsequent encounter.  EXAM: BILATERAL RIBS - 3+ VIEW  COMPARISON:  Recent prior chest x-ray and rib series 03/05/2014  FINDINGS: Cardiomegaly. Atherosclerotic calcifications in the tortuous thoracic aorta. No evidence of pneumothorax or pulmonary edema. Mild bibasilar atelectasis. No evidence of acute rib fracture. Remote T9 compression fracture with approximately 60% height loss anteriorly. Dextro convex scoliosis of the thoracic spine centered at T9.  IMPRESSION: 1. No acute fracture or malalignment. 2. Remote T9 compression fracture. 3. Slightly increased bibasilar atelectasis may be related to splinting. 4. Stable  cardiomegaly.   Electronically Signed   By: Malachy Moan M.D.   On: 03/08/2014 10:06     EKG Interpretation   Date/Time:  Wednesday March 05 2014 18:09:49 EST Ventricular Rate:  109 PR Interval:  166 QRS Duration: 128 QT Interval:  377 QTC Calculation: 508 R Axis:   -86 Text Interpretation:  Sinus tachycardia Left bundle branch block Confirmed  by STEINL  MD, Caryn Bee ([id number]) on 03/05/2014 6:22:19 PM      MDM   Final diagnoses:  Seizure  Right-sided chest wall pain  UTI (lower urinary tract infection)    74 year old female with fall after possible seizure. Chest wall pain without evidence of rib fracture or other traumatic injury. CT head does not show any acute process. She apparently had  another seizure all back at radiology. She has a history of seizures. She reports compliance with her medications. She is not back to her baseline. Electrolytes are okay. At this point I feel she is stable for discharge. UA with possible UTI. Antibiotics. Her culture.    Raeford Razor, MD 03/10/14 [phone]

## 2014-03-11 DIAGNOSIS — I129 Hypertensive chronic kidney disease with stage 1 through stage 4 chronic kidney disease, or unspecified chronic kidney disease: Secondary | ICD-10-CM | POA: Diagnosis not present

## 2014-03-11 DIAGNOSIS — I252 Old myocardial infarction: Secondary | ICD-10-CM | POA: Diagnosis not present

## 2014-03-11 DIAGNOSIS — Z8673 Personal history of transient ischemic attack (TIA), and cerebral infarction without residual deficits: Secondary | ICD-10-CM | POA: Diagnosis not present

## 2014-03-11 DIAGNOSIS — N183 Chronic kidney disease, stage 3 (moderate): Secondary | ICD-10-CM | POA: Diagnosis not present

## 2014-03-11 DIAGNOSIS — I5042 Chronic combined systolic (congestive) and diastolic (congestive) heart failure: Secondary | ICD-10-CM | POA: Diagnosis not present

## 2014-03-11 DIAGNOSIS — F17210 Nicotine dependence, cigarettes, uncomplicated: Secondary | ICD-10-CM | POA: Diagnosis not present

## 2014-03-11 DIAGNOSIS — G40909 Epilepsy, unspecified, not intractable, without status epilepticus: Secondary | ICD-10-CM | POA: Diagnosis not present

## 2014-03-11 DIAGNOSIS — N390 Urinary tract infection, site not specified: Secondary | ICD-10-CM | POA: Diagnosis not present

## 2014-03-12 DIAGNOSIS — G40909 Epilepsy, unspecified, not intractable, without status epilepticus: Secondary | ICD-10-CM | POA: Diagnosis not present

## 2014-03-12 DIAGNOSIS — N183 Chronic kidney disease, stage 3 (moderate): Secondary | ICD-10-CM | POA: Diagnosis not present

## 2014-03-12 DIAGNOSIS — I5042 Chronic combined systolic (congestive) and diastolic (congestive) heart failure: Secondary | ICD-10-CM | POA: Diagnosis not present

## 2014-03-12 DIAGNOSIS — F17210 Nicotine dependence, cigarettes, uncomplicated: Secondary | ICD-10-CM | POA: Diagnosis not present

## 2014-03-12 DIAGNOSIS — I252 Old myocardial infarction: Secondary | ICD-10-CM | POA: Diagnosis not present

## 2014-03-12 DIAGNOSIS — N390 Urinary tract infection, site not specified: Secondary | ICD-10-CM | POA: Diagnosis not present

## 2014-03-12 DIAGNOSIS — I129 Hypertensive chronic kidney disease with stage 1 through stage 4 chronic kidney disease, or unspecified chronic kidney disease: Secondary | ICD-10-CM | POA: Diagnosis not present

## 2014-03-12 DIAGNOSIS — Z8673 Personal history of transient ischemic attack (TIA), and cerebral infarction without residual deficits: Secondary | ICD-10-CM | POA: Diagnosis not present

## 2014-03-20 ENCOUNTER — Ambulatory Visit: Payer: Commercial Managed Care - HMO | Admitting: Nurse Practitioner

## 2014-03-20 DIAGNOSIS — N183 Chronic kidney disease, stage 3 (moderate): Secondary | ICD-10-CM | POA: Diagnosis not present

## 2014-03-20 DIAGNOSIS — N3090 Cystitis, unspecified without hematuria: Secondary | ICD-10-CM | POA: Diagnosis not present

## 2014-03-20 DIAGNOSIS — I129 Hypertensive chronic kidney disease with stage 1 through stage 4 chronic kidney disease, or unspecified chronic kidney disease: Secondary | ICD-10-CM | POA: Diagnosis not present

## 2014-03-20 DIAGNOSIS — I10 Essential (primary) hypertension: Secondary | ICD-10-CM | POA: Diagnosis not present

## 2014-03-20 DIAGNOSIS — R0789 Other chest pain: Secondary | ICD-10-CM | POA: Diagnosis not present

## 2014-03-20 DIAGNOSIS — I252 Old myocardial infarction: Secondary | ICD-10-CM | POA: Diagnosis not present

## 2014-03-20 DIAGNOSIS — N390 Urinary tract infection, site not specified: Secondary | ICD-10-CM | POA: Diagnosis not present

## 2014-03-20 DIAGNOSIS — I5042 Chronic combined systolic (congestive) and diastolic (congestive) heart failure: Secondary | ICD-10-CM | POA: Diagnosis not present

## 2014-03-20 DIAGNOSIS — Z8673 Personal history of transient ischemic attack (TIA), and cerebral infarction without residual deficits: Secondary | ICD-10-CM | POA: Diagnosis not present

## 2014-03-20 DIAGNOSIS — F17210 Nicotine dependence, cigarettes, uncomplicated: Secondary | ICD-10-CM | POA: Diagnosis not present

## 2014-03-20 DIAGNOSIS — G40909 Epilepsy, unspecified, not intractable, without status epilepticus: Secondary | ICD-10-CM | POA: Diagnosis not present

## 2014-03-24 DIAGNOSIS — I5042 Chronic combined systolic (congestive) and diastolic (congestive) heart failure: Secondary | ICD-10-CM | POA: Diagnosis not present

## 2014-03-24 DIAGNOSIS — Z8673 Personal history of transient ischemic attack (TIA), and cerebral infarction without residual deficits: Secondary | ICD-10-CM | POA: Diagnosis not present

## 2014-03-24 DIAGNOSIS — N390 Urinary tract infection, site not specified: Secondary | ICD-10-CM | POA: Diagnosis not present

## 2014-03-24 DIAGNOSIS — F17210 Nicotine dependence, cigarettes, uncomplicated: Secondary | ICD-10-CM | POA: Diagnosis not present

## 2014-03-24 DIAGNOSIS — G40909 Epilepsy, unspecified, not intractable, without status epilepticus: Secondary | ICD-10-CM | POA: Diagnosis not present

## 2014-03-24 DIAGNOSIS — N183 Chronic kidney disease, stage 3 (moderate): Secondary | ICD-10-CM | POA: Diagnosis not present

## 2014-03-24 DIAGNOSIS — I252 Old myocardial infarction: Secondary | ICD-10-CM | POA: Diagnosis not present

## 2014-03-24 DIAGNOSIS — I129 Hypertensive chronic kidney disease with stage 1 through stage 4 chronic kidney disease, or unspecified chronic kidney disease: Secondary | ICD-10-CM | POA: Diagnosis not present

## 2014-03-26 DIAGNOSIS — F17210 Nicotine dependence, cigarettes, uncomplicated: Secondary | ICD-10-CM | POA: Diagnosis not present

## 2014-03-26 DIAGNOSIS — N183 Chronic kidney disease, stage 3 (moderate): Secondary | ICD-10-CM | POA: Diagnosis not present

## 2014-03-26 DIAGNOSIS — G40909 Epilepsy, unspecified, not intractable, without status epilepticus: Secondary | ICD-10-CM | POA: Diagnosis not present

## 2014-03-26 DIAGNOSIS — I5042 Chronic combined systolic (congestive) and diastolic (congestive) heart failure: Secondary | ICD-10-CM | POA: Diagnosis not present

## 2014-03-26 DIAGNOSIS — N390 Urinary tract infection, site not specified: Secondary | ICD-10-CM | POA: Diagnosis not present

## 2014-03-26 DIAGNOSIS — Z8673 Personal history of transient ischemic attack (TIA), and cerebral infarction without residual deficits: Secondary | ICD-10-CM | POA: Diagnosis not present

## 2014-03-26 DIAGNOSIS — I252 Old myocardial infarction: Secondary | ICD-10-CM | POA: Diagnosis not present

## 2014-03-26 DIAGNOSIS — I129 Hypertensive chronic kidney disease with stage 1 through stage 4 chronic kidney disease, or unspecified chronic kidney disease: Secondary | ICD-10-CM | POA: Diagnosis not present

## 2014-03-28 DIAGNOSIS — Z8673 Personal history of transient ischemic attack (TIA), and cerebral infarction without residual deficits: Secondary | ICD-10-CM | POA: Diagnosis not present

## 2014-03-28 DIAGNOSIS — I252 Old myocardial infarction: Secondary | ICD-10-CM | POA: Diagnosis not present

## 2014-03-28 DIAGNOSIS — F17210 Nicotine dependence, cigarettes, uncomplicated: Secondary | ICD-10-CM | POA: Diagnosis not present

## 2014-03-28 DIAGNOSIS — N183 Chronic kidney disease, stage 3 (moderate): Secondary | ICD-10-CM | POA: Diagnosis not present

## 2014-03-28 DIAGNOSIS — I129 Hypertensive chronic kidney disease with stage 1 through stage 4 chronic kidney disease, or unspecified chronic kidney disease: Secondary | ICD-10-CM | POA: Diagnosis not present

## 2014-03-28 DIAGNOSIS — N390 Urinary tract infection, site not specified: Secondary | ICD-10-CM | POA: Diagnosis not present

## 2014-03-28 DIAGNOSIS — G40909 Epilepsy, unspecified, not intractable, without status epilepticus: Secondary | ICD-10-CM | POA: Diagnosis not present

## 2014-03-28 DIAGNOSIS — I5042 Chronic combined systolic (congestive) and diastolic (congestive) heart failure: Secondary | ICD-10-CM | POA: Diagnosis not present

## 2014-03-29 DIAGNOSIS — I5042 Chronic combined systolic (congestive) and diastolic (congestive) heart failure: Secondary | ICD-10-CM | POA: Diagnosis not present

## 2014-03-29 DIAGNOSIS — F17210 Nicotine dependence, cigarettes, uncomplicated: Secondary | ICD-10-CM | POA: Diagnosis not present

## 2014-03-29 DIAGNOSIS — G40909 Epilepsy, unspecified, not intractable, without status epilepticus: Secondary | ICD-10-CM | POA: Diagnosis not present

## 2014-03-29 DIAGNOSIS — I129 Hypertensive chronic kidney disease with stage 1 through stage 4 chronic kidney disease, or unspecified chronic kidney disease: Secondary | ICD-10-CM | POA: Diagnosis not present

## 2014-03-29 DIAGNOSIS — N183 Chronic kidney disease, stage 3 (moderate): Secondary | ICD-10-CM | POA: Diagnosis not present

## 2014-03-29 DIAGNOSIS — I252 Old myocardial infarction: Secondary | ICD-10-CM | POA: Diagnosis not present

## 2014-03-29 DIAGNOSIS — N390 Urinary tract infection, site not specified: Secondary | ICD-10-CM | POA: Diagnosis not present

## 2014-03-29 DIAGNOSIS — Z8673 Personal history of transient ischemic attack (TIA), and cerebral infarction without residual deficits: Secondary | ICD-10-CM | POA: Diagnosis not present

## 2014-03-31 DIAGNOSIS — Z8673 Personal history of transient ischemic attack (TIA), and cerebral infarction without residual deficits: Secondary | ICD-10-CM | POA: Diagnosis not present

## 2014-03-31 DIAGNOSIS — N183 Chronic kidney disease, stage 3 (moderate): Secondary | ICD-10-CM | POA: Diagnosis not present

## 2014-03-31 DIAGNOSIS — F17210 Nicotine dependence, cigarettes, uncomplicated: Secondary | ICD-10-CM | POA: Diagnosis not present

## 2014-03-31 DIAGNOSIS — N390 Urinary tract infection, site not specified: Secondary | ICD-10-CM | POA: Diagnosis not present

## 2014-03-31 DIAGNOSIS — I5042 Chronic combined systolic (congestive) and diastolic (congestive) heart failure: Secondary | ICD-10-CM | POA: Diagnosis not present

## 2014-03-31 DIAGNOSIS — I129 Hypertensive chronic kidney disease with stage 1 through stage 4 chronic kidney disease, or unspecified chronic kidney disease: Secondary | ICD-10-CM | POA: Diagnosis not present

## 2014-03-31 DIAGNOSIS — I252 Old myocardial infarction: Secondary | ICD-10-CM | POA: Diagnosis not present

## 2014-03-31 DIAGNOSIS — G40909 Epilepsy, unspecified, not intractable, without status epilepticus: Secondary | ICD-10-CM | POA: Diagnosis not present

## 2014-04-01 ENCOUNTER — Encounter: Payer: Self-pay | Admitting: Adult Health

## 2014-04-01 ENCOUNTER — Ambulatory Visit (INDEPENDENT_AMBULATORY_CARE_PROVIDER_SITE_OTHER): Payer: Commercial Managed Care - HMO | Admitting: Adult Health

## 2014-04-01 VITALS — BP 116/82 | HR 84 | Ht 62.0 in | Wt 81.0 lb

## 2014-04-01 DIAGNOSIS — R569 Unspecified convulsions: Secondary | ICD-10-CM

## 2014-04-01 NOTE — Patient Instructions (Signed)
Continue Keppra and Vimpat.  If you have any additional seizures please let us know.

## 2014-04-01 NOTE — Progress Notes (Addendum)
PATIENT: [name] DOB: 19-Mar-1940  REASON FOR VISIT: follow up-seizures HISTORY FROM: patient  HISTORY OF PRESENT ILLNESS: [name] is a 74 year old female with a history of seizures. She returns today for follow-up. The patient is currently taken Vimpat 100 mg twice a day. And Keppra 750 mg twice a day. She reports that her seizures was controlled until the beginning of February. Patient states that she fell out of bed at the beginning of February and was taken to the hospital. While she was having x-rays completed she had a seizure event. She was later discharged from the hospital but had a seizure on the way out. She was then admitted for several days. Her Keppra was increased to 750 mg twice a day. Her son also reports that she was diagnosed with a urinary tract infection while in the hospital. Since being discharged from the hospital she has not had any additional seizure events. does not operate a motor vehicle. Denies any changes with her gait or balance. She states that she has a therapist that works with her. She uses a cane for ambulation.  No new medical issues since last seen.   HISTORY 09/17/13 (SETHI): 74 year lady who was admitted on 12/28/12 with altered mental status as well as recurrent seizures with partial onset. She was initially treated with Dilantin but had breakthrough seizures and subsequently Keppra was added as well as eventually intact. She had an EEG which showed potential epileptogenic focus in the left parieto-occipital region. She had right-sided weakness and balance difficulties as well as subsequently memory problems and delayed processing. MRI scan of the brain showed an acute right basal ganglia lacunar infarct. Echocardiogram showed decreased ejection fraction of 35-40% with diffuse hypokinesis and Doppler studies were unremarkable. Lipid profile was normal.She improved during the hospitalization and subsequently was transferred to inpatient rehabilitation  where she made slow steady improvement. She was eventually discharged home. The patient is able to walk with a walker still has some residual left-sided weakness which is old from her previous stroke. She however complains of feeling tired and having a lot of fatigue as well as dizziness and the family feels is related to is her seizure medications. She is currently on Dilantin 100 mg at night, Keppra 1000 mg twice daily and was vimpat 100 twice daily. She has no prior history of preceding seizures. She has not had any recurrent seizure since she left the hospital. Update 09/17/2013 : She returns for followup of the last visit 6 months ago. She missed her followup appointment in between. She states that stopping the Dilantin and decreasing the Keppra to 500 twice daily has helped her symptoms. She is no longer dizzy or sleepy and has more energy. She has been more active and is almost back to her prior baseline. She continues to have mild left-sided weakness but this is old from her previous stroke. She is currently taking 1 tab 100 twice daily and Keppra 500 twice daily and tolerating both medications well without any side effects. She has no new complaints today.  REVIEW OF SYSTEMS: Out of a complete 14 system review of symptoms, the patient complains only of the following symptoms, and all other reviewed systems are negative.  Aching muscles, constipation, appetite change  ALLERGIES: No Known Allergies  HOME MEDICATIONS: Outpatient Prescriptions Prior to Visit  Medication Sig Dispense Refill  . aspirin 325 MG tablet Take 325 mg by mouth every morning.     . calcitRIOL (ROCALTROL) 0.25 MCG capsule  Take 0.25 mcg by mouth daily.    . carvedilol (COREG) 6.25 MG tablet Take 1 tablet (6.25 mg total) by mouth 2 (two) times daily with a meal. 60 tablet 3  . furosemide (LASIX) 20 MG tablet Take 2 tablets (40 mg total) by mouth daily. 90 tablet 1  . HYDROcodone-acetaminophen (NORCO/VICODIN) 5-325 MG per  tablet Take 1 tablet by mouth every 6 (six) hours as needed for moderate pain or severe pain. 30 tablet 0  . Lacosamide (VIMPAT) 100 MG TABS Take 1 tablet (100 mg total) by mouth 2 (two) times daily. 60 tablet 5  . levETIRAcetam (KEPPRA) 750 MG tablet Take 1 tablet (750 mg total) by mouth 2 (two) times daily. 60 tablet 3  . levofloxacin (LEVAQUIN) 250 MG tablet Take 1 tablet (250 mg total) by mouth daily. X 3days 3 tablet 0   No facility-administered medications prior to visit.    PAST MEDICAL HISTORY: Past Medical History  Diagnosis Date  . MI (myocardial infarction)   . Hypertension   . Stroke   . Congestive heart failure (CHF)     EF 35% Nov 2014  . Epilepsy   . Smoker     PAST SURGICAL HISTORY: No past surgical history on file.  FAMILY HISTORY: Family History  Problem Relation Age of Onset  . Cancer Father   . Anemia Daughter        PHYSICAL EXAM  Filed Vitals:   04/01/14 0849  BP: 116/82  Pulse: 84  Height:  (1.575 m)  Weight: 81 lb (36.741 kg)   Body mass index is 14.81 kg/(m^2).  Generalized: Well developed, in no acute distress   Neurological examination  Mentation: Alert oriented to time, place, history taking. Follows all commands speech and language fluent. MMSE 29/30 Cranial nerve II-XII: Pupils were equal round reactive to light. Extraocular movements were full, visual field were full on confrontational test. Facial sensation and strength were normal. Uvula tongue midline. Head turning and shoulder shrug  were normal and symmetric. Motor: The motor testing reveals 5 over 5 strength of all 4 extremities. Good symmetric motor tone is noted throughout.  Sensory: Sensory testing is intact to soft touch on all 4 extremities. No evidence of extinction is noted.  Coordination: Cerebellar testing reveals good finger-nose-finger and heel-to-shin bilaterally.  Gait and station: Gait is normal. Tandem gait is normal. Romberg is negative. No drift is seen.    Reflexes: Deep tendon reflexes are symmetric and normal bilaterally.    DIAGNOSTIC DATA (LABS, IMAGING, TESTING) - I reviewed patient records, labs, notes, testing and imaging myself where available.  Lab Results  Component Value Date   WBC 6.8 03/07/2014   HGB 10.8* 03/07/2014   HCT 33.0* 03/07/2014   MCV 98.5 03/07/2014   PLT 212 03/07/2014      Component Value Date/Time   NA 141 03/09/2014 0625   K 4.0 03/09/2014 0625   CL 109 03/09/2014 0625   CO2 25 03/09/2014 0625   GLUCOSE 104* 03/09/2014 0625   BUN 14 03/09/2014 0625   CREATININE 1.25* 03/09/2014 0625   CALCIUM 8.6 03/09/2014 0625   PROT 7.5 03/06/2014 0606   ALBUMIN 3.8 03/06/2014 0606   AST 30 03/06/2014 0606   ALT 25 03/06/2014 0606   ALKPHOS 71 03/06/2014 0606   BILITOT 0.7 03/06/2014 0606   GFRNONAA 42* 03/09/2014 0625   GFRAA 49* 03/09/2014 0625   Lab Results  Component Value Date   CHOL 175 03/06/2014   HDL 68 03/06/2014  LDLCALC 92 03/06/2014   TRIG 73 03/06/2014   CHOLHDL 2.6 03/06/2014   Lab Results  Component Value Date   HGBA1C 5.8* 03/06/2014    Lab Results  Component Value Date   TSH 0.754 03/06/2014      ASSESSMENT AND PLAN 74 y.o. year old female  has a past medical history of MI (myocardial infarction); Hypertension; Stroke; Congestive heart failure (CHF); Epilepsy; and Smoker. here with:  1. Seizures  Patient has not had any additional seizure event since being discharged from the hospital. She will continue taking Keppra 750 mg twice a day as well as Vimpat 100 mg twice a day. I have informed the patient that if she has any additional seizure events she should let us know. The patient verbalized understanding. I spent 25 minutes with this patient and 50% of this time was spent counseling the patient on potential triggers for seizures. I also answered questions regarding her medication and the duration that she would need to be on this medication. Patient will follow-up in 4  months or sooner if needed.   Butch Penny, MSN, NP-C 04/01/2014, 8:48 AM [facility] [address] [place], [place] [address] [phone]  Note: This document was prepared with digital dictation and possible smart phrase technology. Any transcriptional errors that result from this process are unintentional.   Personally examined patient and images, and have documentes history, physical, neuro exam,assessment and plan as stated above.   Naomie Dean, MD [facility]

## 2014-04-02 DIAGNOSIS — I129 Hypertensive chronic kidney disease with stage 1 through stage 4 chronic kidney disease, or unspecified chronic kidney disease: Secondary | ICD-10-CM | POA: Diagnosis not present

## 2014-04-02 DIAGNOSIS — F17210 Nicotine dependence, cigarettes, uncomplicated: Secondary | ICD-10-CM | POA: Diagnosis not present

## 2014-04-02 DIAGNOSIS — N390 Urinary tract infection, site not specified: Secondary | ICD-10-CM | POA: Diagnosis not present

## 2014-04-02 DIAGNOSIS — Z8673 Personal history of transient ischemic attack (TIA), and cerebral infarction without residual deficits: Secondary | ICD-10-CM | POA: Diagnosis not present

## 2014-04-02 DIAGNOSIS — G40909 Epilepsy, unspecified, not intractable, without status epilepticus: Secondary | ICD-10-CM | POA: Diagnosis not present

## 2014-04-02 DIAGNOSIS — N183 Chronic kidney disease, stage 3 (moderate): Secondary | ICD-10-CM | POA: Diagnosis not present

## 2014-04-02 DIAGNOSIS — I252 Old myocardial infarction: Secondary | ICD-10-CM | POA: Diagnosis not present

## 2014-04-02 DIAGNOSIS — I5042 Chronic combined systolic (congestive) and diastolic (congestive) heart failure: Secondary | ICD-10-CM | POA: Diagnosis not present

## 2014-04-02 NOTE — Progress Notes (Signed)
I agree with the above plan 

## 2014-04-03 DIAGNOSIS — G40909 Epilepsy, unspecified, not intractable, without status epilepticus: Secondary | ICD-10-CM | POA: Diagnosis not present

## 2014-04-03 DIAGNOSIS — I5042 Chronic combined systolic (congestive) and diastolic (congestive) heart failure: Secondary | ICD-10-CM | POA: Diagnosis not present

## 2014-04-03 DIAGNOSIS — N390 Urinary tract infection, site not specified: Secondary | ICD-10-CM | POA: Diagnosis not present

## 2014-04-03 DIAGNOSIS — I252 Old myocardial infarction: Secondary | ICD-10-CM | POA: Diagnosis not present

## 2014-04-03 DIAGNOSIS — F17210 Nicotine dependence, cigarettes, uncomplicated: Secondary | ICD-10-CM | POA: Diagnosis not present

## 2014-04-03 DIAGNOSIS — Z8673 Personal history of transient ischemic attack (TIA), and cerebral infarction without residual deficits: Secondary | ICD-10-CM | POA: Diagnosis not present

## 2014-04-03 DIAGNOSIS — N183 Chronic kidney disease, stage 3 (moderate): Secondary | ICD-10-CM | POA: Diagnosis not present

## 2014-04-03 DIAGNOSIS — I129 Hypertensive chronic kidney disease with stage 1 through stage 4 chronic kidney disease, or unspecified chronic kidney disease: Secondary | ICD-10-CM | POA: Diagnosis not present

## 2014-04-07 DIAGNOSIS — I252 Old myocardial infarction: Secondary | ICD-10-CM | POA: Diagnosis not present

## 2014-04-07 DIAGNOSIS — F17210 Nicotine dependence, cigarettes, uncomplicated: Secondary | ICD-10-CM | POA: Diagnosis not present

## 2014-04-07 DIAGNOSIS — Z8673 Personal history of transient ischemic attack (TIA), and cerebral infarction without residual deficits: Secondary | ICD-10-CM | POA: Diagnosis not present

## 2014-04-07 DIAGNOSIS — G40909 Epilepsy, unspecified, not intractable, without status epilepticus: Secondary | ICD-10-CM | POA: Diagnosis not present

## 2014-04-07 DIAGNOSIS — I5042 Chronic combined systolic (congestive) and diastolic (congestive) heart failure: Secondary | ICD-10-CM | POA: Diagnosis not present

## 2014-04-07 DIAGNOSIS — N390 Urinary tract infection, site not specified: Secondary | ICD-10-CM | POA: Diagnosis not present

## 2014-04-07 DIAGNOSIS — N183 Chronic kidney disease, stage 3 (moderate): Secondary | ICD-10-CM | POA: Diagnosis not present

## 2014-04-07 DIAGNOSIS — I129 Hypertensive chronic kidney disease with stage 1 through stage 4 chronic kidney disease, or unspecified chronic kidney disease: Secondary | ICD-10-CM | POA: Diagnosis not present

## 2014-04-09 DIAGNOSIS — N390 Urinary tract infection, site not specified: Secondary | ICD-10-CM | POA: Diagnosis not present

## 2014-04-09 DIAGNOSIS — N183 Chronic kidney disease, stage 3 (moderate): Secondary | ICD-10-CM | POA: Diagnosis not present

## 2014-04-09 DIAGNOSIS — I252 Old myocardial infarction: Secondary | ICD-10-CM | POA: Diagnosis not present

## 2014-04-09 DIAGNOSIS — G40909 Epilepsy, unspecified, not intractable, without status epilepticus: Secondary | ICD-10-CM | POA: Diagnosis not present

## 2014-04-09 DIAGNOSIS — I5042 Chronic combined systolic (congestive) and diastolic (congestive) heart failure: Secondary | ICD-10-CM | POA: Diagnosis not present

## 2014-04-09 DIAGNOSIS — I129 Hypertensive chronic kidney disease with stage 1 through stage 4 chronic kidney disease, or unspecified chronic kidney disease: Secondary | ICD-10-CM | POA: Diagnosis not present

## 2014-04-09 DIAGNOSIS — F17210 Nicotine dependence, cigarettes, uncomplicated: Secondary | ICD-10-CM | POA: Diagnosis not present

## 2014-04-09 DIAGNOSIS — Z8673 Personal history of transient ischemic attack (TIA), and cerebral infarction without residual deficits: Secondary | ICD-10-CM | POA: Diagnosis not present

## 2014-04-15 DIAGNOSIS — G40909 Epilepsy, unspecified, not intractable, without status epilepticus: Secondary | ICD-10-CM | POA: Diagnosis not present

## 2014-04-15 DIAGNOSIS — Z8673 Personal history of transient ischemic attack (TIA), and cerebral infarction without residual deficits: Secondary | ICD-10-CM | POA: Diagnosis not present

## 2014-04-15 DIAGNOSIS — N390 Urinary tract infection, site not specified: Secondary | ICD-10-CM | POA: Diagnosis not present

## 2014-04-15 DIAGNOSIS — I129 Hypertensive chronic kidney disease with stage 1 through stage 4 chronic kidney disease, or unspecified chronic kidney disease: Secondary | ICD-10-CM | POA: Diagnosis not present

## 2014-04-15 DIAGNOSIS — I5042 Chronic combined systolic (congestive) and diastolic (congestive) heart failure: Secondary | ICD-10-CM | POA: Diagnosis not present

## 2014-04-15 DIAGNOSIS — F17210 Nicotine dependence, cigarettes, uncomplicated: Secondary | ICD-10-CM | POA: Diagnosis not present

## 2014-04-15 DIAGNOSIS — I252 Old myocardial infarction: Secondary | ICD-10-CM | POA: Diagnosis not present

## 2014-04-15 DIAGNOSIS — N183 Chronic kidney disease, stage 3 (moderate): Secondary | ICD-10-CM | POA: Diagnosis not present

## 2014-04-16 DIAGNOSIS — I129 Hypertensive chronic kidney disease with stage 1 through stage 4 chronic kidney disease, or unspecified chronic kidney disease: Secondary | ICD-10-CM | POA: Diagnosis not present

## 2014-04-16 DIAGNOSIS — I5042 Chronic combined systolic (congestive) and diastolic (congestive) heart failure: Secondary | ICD-10-CM | POA: Diagnosis not present

## 2014-04-16 DIAGNOSIS — G40909 Epilepsy, unspecified, not intractable, without status epilepticus: Secondary | ICD-10-CM | POA: Diagnosis not present

## 2014-04-16 DIAGNOSIS — Z8673 Personal history of transient ischemic attack (TIA), and cerebral infarction without residual deficits: Secondary | ICD-10-CM | POA: Diagnosis not present

## 2014-04-16 DIAGNOSIS — I252 Old myocardial infarction: Secondary | ICD-10-CM | POA: Diagnosis not present

## 2014-04-16 DIAGNOSIS — N390 Urinary tract infection, site not specified: Secondary | ICD-10-CM | POA: Diagnosis not present

## 2014-04-16 DIAGNOSIS — F17210 Nicotine dependence, cigarettes, uncomplicated: Secondary | ICD-10-CM | POA: Diagnosis not present

## 2014-04-16 DIAGNOSIS — N183 Chronic kidney disease, stage 3 (moderate): Secondary | ICD-10-CM | POA: Diagnosis not present

## 2014-04-21 DIAGNOSIS — F17210 Nicotine dependence, cigarettes, uncomplicated: Secondary | ICD-10-CM | POA: Diagnosis not present

## 2014-04-21 DIAGNOSIS — I5042 Chronic combined systolic (congestive) and diastolic (congestive) heart failure: Secondary | ICD-10-CM | POA: Diagnosis not present

## 2014-04-21 DIAGNOSIS — I252 Old myocardial infarction: Secondary | ICD-10-CM | POA: Diagnosis not present

## 2014-04-21 DIAGNOSIS — N183 Chronic kidney disease, stage 3 (moderate): Secondary | ICD-10-CM | POA: Diagnosis not present

## 2014-04-21 DIAGNOSIS — N390 Urinary tract infection, site not specified: Secondary | ICD-10-CM | POA: Diagnosis not present

## 2014-04-21 DIAGNOSIS — G40909 Epilepsy, unspecified, not intractable, without status epilepticus: Secondary | ICD-10-CM | POA: Diagnosis not present

## 2014-04-21 DIAGNOSIS — I129 Hypertensive chronic kidney disease with stage 1 through stage 4 chronic kidney disease, or unspecified chronic kidney disease: Secondary | ICD-10-CM | POA: Diagnosis not present

## 2014-04-21 DIAGNOSIS — Z8673 Personal history of transient ischemic attack (TIA), and cerebral infarction without residual deficits: Secondary | ICD-10-CM | POA: Diagnosis not present

## 2014-04-29 DIAGNOSIS — I951 Orthostatic hypotension: Secondary | ICD-10-CM | POA: Diagnosis not present

## 2014-04-29 DIAGNOSIS — D509 Iron deficiency anemia, unspecified: Secondary | ICD-10-CM | POA: Diagnosis not present

## 2014-04-29 DIAGNOSIS — N183 Chronic kidney disease, stage 3 (moderate): Secondary | ICD-10-CM | POA: Diagnosis not present

## 2014-05-08 DIAGNOSIS — I252 Old myocardial infarction: Secondary | ICD-10-CM | POA: Diagnosis not present

## 2014-05-08 DIAGNOSIS — I5042 Chronic combined systolic (congestive) and diastolic (congestive) heart failure: Secondary | ICD-10-CM | POA: Diagnosis not present

## 2014-05-08 DIAGNOSIS — Z8673 Personal history of transient ischemic attack (TIA), and cerebral infarction without residual deficits: Secondary | ICD-10-CM | POA: Diagnosis not present

## 2014-05-08 DIAGNOSIS — G40909 Epilepsy, unspecified, not intractable, without status epilepticus: Secondary | ICD-10-CM | POA: Diagnosis not present

## 2014-05-08 DIAGNOSIS — N183 Chronic kidney disease, stage 3 (moderate): Secondary | ICD-10-CM | POA: Diagnosis not present

## 2014-05-08 DIAGNOSIS — F17210 Nicotine dependence, cigarettes, uncomplicated: Secondary | ICD-10-CM | POA: Diagnosis not present

## 2014-05-08 DIAGNOSIS — I129 Hypertensive chronic kidney disease with stage 1 through stage 4 chronic kidney disease, or unspecified chronic kidney disease: Secondary | ICD-10-CM | POA: Diagnosis not present

## 2014-05-08 DIAGNOSIS — N390 Urinary tract infection, site not specified: Secondary | ICD-10-CM | POA: Diagnosis not present

## 2014-05-21 ENCOUNTER — Telehealth: Payer: Self-pay | Admitting: Neurology

## 2014-05-21 MED ORDER — LACOSAMIDE 100 MG PO TABS: 100.0000 mg | ORAL_TABLET | Freq: Two times a day (BID) | ORAL | Status: DC

## 2014-05-21 NOTE — Telephone Encounter (Signed)
Patient called requesting a refill for Lacosamide (VIMPAT) 100 MG TABS. Pharmacy is [facility] on [address]. Pt's c/b [phone]

## 2014-06-09 DIAGNOSIS — N184 Chronic kidney disease, stage 4 (severe): Secondary | ICD-10-CM | POA: Diagnosis not present

## 2014-06-09 DIAGNOSIS — I129 Hypertensive chronic kidney disease with stage 1 through stage 4 chronic kidney disease, or unspecified chronic kidney disease: Secondary | ICD-10-CM | POA: Diagnosis not present

## 2014-06-09 DIAGNOSIS — N183 Chronic kidney disease, stage 3 (moderate): Secondary | ICD-10-CM | POA: Diagnosis not present

## 2014-06-09 DIAGNOSIS — D631 Anemia in chronic kidney disease: Secondary | ICD-10-CM | POA: Diagnosis not present

## 2014-06-09 DIAGNOSIS — N2581 Secondary hyperparathyroidism of renal origin: Secondary | ICD-10-CM | POA: Diagnosis not present

## 2014-07-04 DIAGNOSIS — M109 Gout, unspecified: Secondary | ICD-10-CM | POA: Diagnosis not present

## 2014-07-04 DIAGNOSIS — N183 Chronic kidney disease, stage 3 (moderate): Secondary | ICD-10-CM | POA: Diagnosis not present

## 2014-07-08 ENCOUNTER — Telehealth: Payer: Self-pay | Admitting: Neurology

## 2014-07-08 NOTE — Telephone Encounter (Signed)
ERROR

## 2014-08-18 ENCOUNTER — Ambulatory Visit: Payer: Commercial Managed Care - HMO | Admitting: Adult Health

## 2014-08-18 ENCOUNTER — Ambulatory Visit (INDEPENDENT_AMBULATORY_CARE_PROVIDER_SITE_OTHER): Payer: Commercial Managed Care - HMO | Admitting: Adult Health

## 2014-08-18 ENCOUNTER — Encounter: Payer: Self-pay | Admitting: Adult Health

## 2014-08-18 VITALS — BP 130/84 | HR 80 | Ht 62.0 in | Wt 82.0 lb

## 2014-08-18 DIAGNOSIS — R413 Other amnesia: Secondary | ICD-10-CM

## 2014-08-18 DIAGNOSIS — R569 Unspecified convulsions: Secondary | ICD-10-CM

## 2014-08-18 MED ORDER — LACOSAMIDE 150 MG PO TABS: 150.0000 mg | ORAL_TABLET | Freq: Two times a day (BID) | ORAL | Status: DC

## 2014-08-18 MED ORDER — LEVETIRACETAM 500 MG PO TABS: 500.0000 mg | ORAL_TABLET | Freq: Every day | ORAL | Status: DC

## 2014-08-18 NOTE — Progress Notes (Signed)
I agree with the above plan 

## 2014-08-18 NOTE — Progress Notes (Signed)
PATIENT: [name] DOB: 1940/06/22  REASON FOR VISIT: follow up- seizures, memory HISTORY FROM: patient  HISTORY OF PRESENT ILLNESS: [name] is a 74 year old female with a history of seizures. She returns today for follow-up. The patient is currently taken Vimpat 100 mg twice a day and tolerating it well. The patient decreased her Keppra dose to half a tablet at bedtime. She denies any seizure activity since the last visit. She continues to complete all ADLs independently. She does not operate a motor vehicle. She denies any changes with her gait or balance. Patient states that she sleeps well at night. The patient had to decrease her dose of Keppra because she was sleepy all the time. Family member reports that her sleepiness has improved since she decreased her Keppra dose. Patient feels that her memory has remained stable. Denies any changes with her memory. Her family member reports that "she does not forget anything." She denies any new medical issues. She returns today for an evaluation.  HISTORY 04/01/14: [name] is a 75 year old female with a history of seizures. She returns today for follow-up. The patient is currently taken Vimpat 100 mg twice a day. And Keppra 750 mg twice a day. She reports that her seizures was controlled until the beginning of February. Patient states that she fell out of bed at the beginning of February and was taken to the hospital. While she was having x-rays completed she had a seizure event. She was later discharged from the hospital but had a seizure on the way out. She was then admitted for several days. Her Keppra was increased to 750 mg twice a day. Her son also reports that she was diagnosed with a urinary tract infection while in the hospital. Since being discharged from the hospital she has not had any additional seizure events. does not operate a motor vehicle. Denies any changes with her gait or balance. She states that she has a therapist that  works with her. She uses a cane for ambulation. No new medical issues since last seen.   HISTORY 09/17/13 (SETHI): 78 year lady who was admitted on 12/28/12 with altered mental status as well as recurrent seizures with partial onset. She was initially treated with Dilantin but had breakthrough seizures and subsequently Keppra was added as well as eventually intact. She had an EEG which showed potential epileptogenic focus in the left parieto-occipital region. She had right-sided weakness and balance difficulties as well as subsequently memory problems and delayed processing. MRI scan of the brain showed an acute right basal ganglia lacunar infarct. Echocardiogram showed decreased ejection fraction of 35-40% with diffuse hypokinesis and Doppler studies were unremarkable. Lipid profile was normal.She improved during the hospitalization and subsequently was transferred to inpatient rehabilitation where she made slow steady improvement. She was eventually discharged home. The patient is able to walk with a walker still has some residual left-sided weakness which is old from her previous stroke. She however complains of feeling tired and having a lot of fatigue as well as dizziness and the family feels is related to is her seizure medications. She is currently on Dilantin 100 mg at night, Keppra 1000 mg twice daily and was vimpat 100 twice daily. She has no prior history of preceding seizures. She has not had any recurrent seizure since she left the hospital. Update 09/17/2013 : She returns for followup of the last visit 6 months ago. She missed her followup appointment in between. She states that stopping the Dilantin and decreasing  the Keppra to 500 twice daily has helped her symptoms. She is no longer dizzy or sleepy and has more energy. She has been more active and is almost back to her prior baseline. She continues to have mild left-sided weakness but this is old from her previous stroke. She is currently taking  1 tab 100 twice daily and Keppra 500 twice daily and tolerating both medications well without any side effects. She has no new complaints today.  REVIEW OF SYSTEMS: Out of a complete 14 system review of symptoms, the patient complains only of the following symptoms, and all other reviewed systems are negative.  Appetite change, fatigue, daytime sleepiness, headache  ALLERGIES: No Known Allergies  HOME MEDICATIONS: Outpatient Prescriptions Prior to Visit  Medication Sig Dispense Refill  . calcitRIOL (ROCALTROL) 0.25 MCG capsule Take 0.25 mcg by mouth daily.    . carvedilol (COREG) 6.25 MG tablet Take 1 tablet (6.25 mg total) by mouth 2 (two) times daily with a meal. 60 tablet 3  . furosemide (LASIX) 20 MG tablet Take 2 tablets (40 mg total) by mouth daily. 90 tablet 1  . HYDROcodone-acetaminophen (NORCO/VICODIN) 5-325 MG per tablet Take 1 tablet by mouth every 6 (six) hours as needed for moderate pain or severe pain. 30 tablet 0  . Lacosamide (VIMPAT) 100 MG TABS Take 1 tablet (100 mg total) by mouth 2 (two) times daily. 60 tablet 5  . levETIRAcetam (KEPPRA) 750 MG tablet Take 1 tablet (750 mg total) by mouth 2 (two) times daily. (Patient taking differently: Take 375 mg by mouth daily. ) 60 tablet 3  . aspirin 325 MG tablet Take 325 mg by mouth every morning.      No facility-administered medications prior to visit.    PAST MEDICAL HISTORY: Past Medical History  Diagnosis Date  . MI (myocardial infarction)   . Hypertension   . Stroke   . Congestive heart failure (CHF)     EF 35% Nov 2014  . Epilepsy   . Smoker     PAST SURGICAL HISTORY: Past Surgical History  Procedure Laterality Date  . No past surgeries      FAMILY HISTORY: Family History  Problem Relation Age of Onset  . Cancer Father   . Anemia Daughter     SOCIAL HISTORY: History   Social History  . Marital Status: Single    Spouse Name: N/A  . Number of Children: 3  . Years of Education: 13   Occupational  History  .      Retired   Social History Main Topics  . Smoking status: Current Some Day Smoker    Types: Cigarettes  . Smokeless tobacco: Never Used  . Alcohol Use: 0.0 oz/week    0 Standard drinks or equivalent per week     Comment: occasional  . Drug Use: No  . Sexual Activity: Not on file   Other Topics Concern  . Not on file   Social History Narrative   Patient lives at home alone.    Retired.   Education one year of college.   Left handed.   Caffeine one cup of coffee daily and one cup of tea.      PHYSICAL EXAM  Filed Vitals:   08/18/14 0811  BP: 130/84  Pulse: 80  Height: 5\' 2"  (1.575 m)  Weight: 82 lb (37.195 kg)   Body mass index is 14.99 kg/(m^2).  Generalized: Well developed, in no acute distress   Neurological examination  Mentation: Alert oriented to  time, place, history taking. Follows all commands speech and language fluent. MMSE 29/30 Cranial nerve II-XII: Pupils were equal round reactive to light. Extraocular movements were full, visual field were full on confrontational test. Facial sensation and strength were normal. Uvula tongue midline. Head turning and shoulder shrug  were normal and symmetric. Motor: The motor testing reveals 5 over 5 strength of all 4 extremities. Good symmetric motor tone is noted throughout.  Sensory: Sensory testing is intact to soft touch on all 4 extremities. No evidence of extinction is noted.  Coordination: Cerebellar testing reveals good finger-nose-finger and heel-to-shin bilaterally.  Gait and station: Gait is normal. Tandem gait is normal. Romberg is negative. No drift is seen.  Reflexes: Deep tendon reflexes are symmetric and normal bilaterally.   DIAGNOSTIC DATA (LABS, IMAGING, TESTING) - I reviewed patient records, labs, notes, testing and imaging myself where available.  Lab Results  Component Value Date   WBC 6.8 03/07/2014   HGB 10.8* 03/07/2014   HCT 33.0* 03/07/2014   MCV 98.5 03/07/2014   PLT 212  03/07/2014      Component Value Date/Time   NA 141 03/09/2014 0625   K 4.0 03/09/2014 0625   CL 109 03/09/2014 0625   CO2 25 03/09/2014 0625   GLUCOSE 104* 03/09/2014 0625   BUN 14 03/09/2014 0625   CREATININE 1.25* 03/09/2014 0625   CALCIUM 8.6 03/09/2014 0625   PROT 7.5 03/06/2014 0606   ALBUMIN 3.8 03/06/2014 0606   AST 30 03/06/2014 0606   ALT 25 03/06/2014 0606   ALKPHOS 71 03/06/2014 0606   BILITOT 0.7 03/06/2014 0606   GFRNONAA 42* 03/09/2014 0625   GFRAA 49* 03/09/2014 0625   Lab Results  Component Value Date   CHOL 175 03/06/2014   HDL 68 03/06/2014   LDLCALC 92 03/06/2014   TRIG 73 03/06/2014   CHOLHDL 2.6 03/06/2014   Lab Results  Component Value Date   HGBA1C 5.8* 03/06/2014    Lab Results  Component Value Date   TSH 0.754 03/06/2014      ASSESSMENT AND PLAN 74 y.o. year old female  has a past medical history of MI (myocardial infarction); Hypertension; Stroke; Congestive heart failure (CHF); Epilepsy; and Smoker. here with:  1. Seizures 2. Mild memory disturbance  The patient had to decrease her Keppra dose due to drowsiness. I will increase her Vimpat to 150 mg twice a day. I will order Keppra 500 mg daily at bedtime. Patient will let me know if she is unable to tolerate these medications. Patient advised that if she has any seizure events she should let us know. Patient's memory is stable. We will continue to monitor. She will follow-up in 4 months or sooner if needed.     Butch Penny, MSN, NP-C 08/18/2014, 8:33 AM [facility] [address] [place], [place] [address] [phone]  Note: This document was prepared with digital dictation and possible smart phrase technology. Any transcriptional errors that result from this process are unintentional.

## 2014-08-18 NOTE — Patient Instructions (Signed)
Take Vimpat 150 mg 1 tablet twice a day Take Keppra 500 mg daily at bedtime.  If you are unable to tolerate these medications please let us know.  If you have any seizure events please let us know. If your symptoms worsen or you develop new symptoms please let us know.

## 2014-08-21 ENCOUNTER — Other Ambulatory Visit: Payer: Self-pay

## 2014-08-21 MED ORDER — LEVETIRACETAM 500 MG PO TABS: 500.0000 mg | ORAL_TABLET | Freq: Every day | ORAL | Status: DC

## 2014-08-21 NOTE — Telephone Encounter (Signed)
Patient requests 90 day Rx  

## 2014-09-15 DIAGNOSIS — E46 Unspecified protein-calorie malnutrition: Secondary | ICD-10-CM | POA: Diagnosis not present

## 2014-09-15 DIAGNOSIS — G40909 Epilepsy, unspecified, not intractable, without status epilepticus: Secondary | ICD-10-CM | POA: Diagnosis not present

## 2014-09-15 DIAGNOSIS — Z1211 Encounter for screening for malignant neoplasm of colon: Secondary | ICD-10-CM | POA: Diagnosis not present

## 2014-09-15 DIAGNOSIS — R609 Edema, unspecified: Secondary | ICD-10-CM | POA: Diagnosis not present

## 2014-09-15 DIAGNOSIS — Z1389 Encounter for screening for other disorder: Secondary | ICD-10-CM | POA: Diagnosis not present

## 2014-09-15 DIAGNOSIS — N183 Chronic kidney disease, stage 3 (moderate): Secondary | ICD-10-CM | POA: Diagnosis not present

## 2014-09-15 DIAGNOSIS — Z0000 Encounter for general adult medical examination without abnormal findings: Secondary | ICD-10-CM | POA: Diagnosis not present

## 2014-09-15 DIAGNOSIS — I129 Hypertensive chronic kidney disease with stage 1 through stage 4 chronic kidney disease, or unspecified chronic kidney disease: Secondary | ICD-10-CM | POA: Diagnosis not present

## 2014-09-23 ENCOUNTER — Other Ambulatory Visit: Payer: Self-pay | Admitting: Family Medicine

## 2014-09-23 ENCOUNTER — Telehealth: Payer: Self-pay | Admitting: Adult Health

## 2014-09-23 DIAGNOSIS — Z1231 Encounter for screening mammogram for malignant neoplasm of breast: Secondary | ICD-10-CM

## 2014-09-23 NOTE — Telephone Encounter (Signed)
Patient called in regard to Rx levetiracetam 500 mg.  She sounded very confused. She stated the drug store called her to pick up her medication and she didn't know why because she already had this medication with 30 pills.  Please call her to clear up.  Thanks!

## 2014-09-23 NOTE — Telephone Encounter (Signed)
Pharmacy says the patient picked up a 30 day Rx on 07/18.  She was due for a refill, so they refilled her Rx, this time for 90 day supply, stating patient requests 90 days.  I called back and spoke with the patient.  Relayed info from pharmacy.  She expressed understanding and will pick up the Rx.  She will call back if anything further is needed.

## 2014-09-28 ENCOUNTER — Emergency Department (HOSPITAL_COMMUNITY)
Admission: EM | Admit: 2014-09-28 | Discharge: 2014-09-28 | Disposition: A | Discharge status: Home / Self Care | Payer: Commercial Managed Care - HMO | Attending: Emergency Medicine | Admitting: Emergency Medicine

## 2014-09-28 ENCOUNTER — Emergency Department (HOSPITAL_COMMUNITY): Payer: Commercial Managed Care - HMO

## 2014-09-28 ENCOUNTER — Encounter (HOSPITAL_COMMUNITY): Payer: Self-pay

## 2014-09-28 DIAGNOSIS — S0093XA Contusion of unspecified part of head, initial encounter: Secondary | ICD-10-CM

## 2014-09-28 DIAGNOSIS — Z8673 Personal history of transient ischemic attack (TIA), and cerebral infarction without residual deficits: Secondary | ICD-10-CM | POA: Insufficient documentation

## 2014-09-28 DIAGNOSIS — Y998 Other external cause status: Secondary | ICD-10-CM | POA: Diagnosis not present

## 2014-09-28 DIAGNOSIS — I10 Essential (primary) hypertension: Secondary | ICD-10-CM | POA: Diagnosis not present

## 2014-09-28 DIAGNOSIS — W01198A Fall on same level from slipping, tripping and stumbling with subsequent striking against other object, initial encounter: Secondary | ICD-10-CM | POA: Insufficient documentation

## 2014-09-28 DIAGNOSIS — R55 Syncope and collapse: Secondary | ICD-10-CM | POA: Diagnosis not present

## 2014-09-28 DIAGNOSIS — S0083XA Contusion of other part of head, initial encounter: Secondary | ICD-10-CM | POA: Diagnosis not present

## 2014-09-28 DIAGNOSIS — Z7982 Long term (current) use of aspirin: Secondary | ICD-10-CM | POA: Diagnosis not present

## 2014-09-28 DIAGNOSIS — Y92002 Bathroom of unspecified non-institutional (private) residence single-family (private) house as the place of occurrence of the external cause: Secondary | ICD-10-CM | POA: Diagnosis not present

## 2014-09-28 DIAGNOSIS — I252 Old myocardial infarction: Secondary | ICD-10-CM | POA: Diagnosis not present

## 2014-09-28 DIAGNOSIS — I509 Heart failure, unspecified: Secondary | ICD-10-CM | POA: Insufficient documentation

## 2014-09-28 DIAGNOSIS — Z79899 Other long term (current) drug therapy: Secondary | ICD-10-CM | POA: Diagnosis not present

## 2014-09-28 DIAGNOSIS — Z720 Tobacco use: Secondary | ICD-10-CM | POA: Insufficient documentation

## 2014-09-28 DIAGNOSIS — Y9389 Activity, other specified: Secondary | ICD-10-CM | POA: Diagnosis not present

## 2014-09-28 DIAGNOSIS — W19XXXA Unspecified fall, initial encounter: Secondary | ICD-10-CM

## 2014-09-28 DIAGNOSIS — G40909 Epilepsy, unspecified, not intractable, without status epilepticus: Secondary | ICD-10-CM | POA: Diagnosis not present

## 2014-09-28 DIAGNOSIS — S0990XA Unspecified injury of head, initial encounter: Secondary | ICD-10-CM | POA: Diagnosis present

## 2014-09-28 LAB — I-STAT CHEM 8, ED
BUN: 51 mg/dL — ABNORMAL HIGH (ref 6–20)
Calcium, Ion: 1.15 mmol/L (ref 1.13–1.30)
Chloride: 99 mmol/L — ABNORMAL LOW (ref 101–111)
Creatinine, Ser: 1.7 mg/dL — ABNORMAL HIGH (ref 0.44–1.00)
Glucose, Bld: 105 mg/dL — ABNORMAL HIGH (ref 65–99)
HCT: 41 % (ref 36.0–46.0)
Hemoglobin: 13.9 g/dL (ref 12.0–15.0)
Potassium: 4.2 mmol/L (ref 3.5–5.1)
Sodium: 132 mmol/L — ABNORMAL LOW (ref 135–145)
TCO2: 23 mmol/L (ref 0–100)

## 2014-09-28 LAB — I-STAT TROPONIN, ED: Troponin i, poc: 0 ng/mL (ref 0.00–0.08)

## 2014-09-28 NOTE — ED Provider Notes (Signed)
CSN: [id number]     Arrival date & time 09/28/14  1346 History   First MD Initiated Contact with Patient 09/28/14 1544     Chief Complaint  Patient presents with  . Fall  . Head Injury     Patient is a 74 y.o. female presenting with fall and head injury. The history is provided by the patient and the spouse.  Fall  Head Injury  Ms. [name] presents for evaluation of injuries following fall. She was getting out of the bathroom after having a bowel movement when she became lightheaded and diaphoretic and she fell to the floor striking her head. She denies loss of consciousness. Currently she feels fine and would like to go home. She has a history of MI, CHF, epilepsy. She denies any recent changes in her medications or recent illnesses. No hematochezia, no melena, no chest pain, no shortness of breath, no neck pain, no weakness.  Past Medical History  Diagnosis Date  . MI (myocardial infarction)   . Hypertension   . Stroke   . Congestive heart failure (CHF)     EF 35% Nov 2014  . Epilepsy   . Smoker    Past Surgical History  Procedure Laterality Date  . No past surgeries     Family History  Problem Relation Age of Onset  . Cancer Father   . Anemia Daughter    Social History  Substance Use Topics  . Smoking status: Current Some Day Smoker -- 0.10 packs/day    Types: Cigarettes  . Smokeless tobacco: Never Used  . Alcohol Use: 0.0 oz/week    0 Standard drinks or equivalent per week     Comment: occasional   OB History    No data available     Review of Systems  All other systems reviewed and are negative.     Allergies  Review of patient's allergies indicates no known allergies.  Home Medications   Prior to Admission medications   Medication Sig Start Date End Date Taking? Authorizing Provider  allopurinol (ZYLOPRIM) 100 MG tablet Take 200 mg by mouth daily.  07/17/14  Yes Historical Provider, MD  aspirin 325 MG tablet Take 325 mg by mouth every morning.    Yes  Historical Provider, MD  calcitRIOL (ROCALTROL) 0.25 MCG capsule Take 0.25 mcg by mouth daily.   Yes Historical Provider, MD  carvedilol (COREG) 6.25 MG tablet Take 1 tablet (6.25 mg total) by mouth 2 (two) times daily with a meal. 03/09/14  Yes Ripudeep K Rai, MD  furosemide (LASIX) 20 MG tablet Take 2 tablets (40 mg total) by mouth daily. 03/10/14  Yes Ripudeep Jenna Luo, MD  Lacosamide (VIMPAT) 150 MG TABS Take 1 tablet (150 mg total) by mouth 2 (two) times daily. 08/18/14  Yes Butch Penny, NP  levETIRAcetam (KEPPRA) 500 MG tablet Take 1 tablet (500 mg total) by mouth at bedtime. 08/21/14  Yes Butch Penny, NP  HYDROcodone-acetaminophen (NORCO/VICODIN) 5-325 MG per tablet Take 1 tablet by mouth every 6 (six) hours as needed for moderate pain or severe pain. 03/09/14   Ripudeep K Rai, MD   BP 161/93 mmHg  Pulse 73  Temp(Src) 97.9 F (36.6 C) (Oral)  Resp 18  Ht  (1.549 m)  Wt 82 lb (37.195 kg)  BMI 15.50 kg/m2  SpO2 99% Physical Exam  Constitutional: She is oriented to person, place, and time. She appears well-developed and well-nourished.  HENT:  Head: Normocephalic.  Mild swelling and tenderness over the right  upper forehead  Cardiovascular: Normal rate and regular rhythm.   No murmur heard. Pulmonary/Chest: Effort normal and breath sounds normal. No respiratory distress.  Abdominal: Soft. There is no tenderness. There is no rebound and no guarding.  Musculoskeletal: She exhibits no edema or tenderness.  No C, T, L-spine tenderness  Neurological: She is alert and oriented to person, place, and time.  5/5 strength in all four extremities.   Skin: Skin is warm and dry.  Psychiatric: She has a normal mood and affect. Her behavior is normal.  Nursing note and vitals reviewed.   ED Course  Procedures (including critical care time) Labs Review Labs Reviewed  I-STAT CHEM 8, ED - Abnormal; Notable for the following:    Sodium 132 (*)    Chloride 99 (*)    BUN 51 (*)     Creatinine, Ser 1.70 (*)    Glucose, Bld 105 (*)    All other components within normal limits  I-STAT TROPOININ, ED    Imaging Review Ct Head Wo Contrast  09/28/2014   CLINICAL DATA:  Tripped and fell into doorway of her bathroom today striking forehead on door, frontal bruise, on blood thinners, head injury, no loss of consciousness, past history stroke, CHF, hypertension, MI, smoking  EXAM: CT HEAD WITHOUT CONTRAST  TECHNIQUE: Contiguous axial images were obtained from the base of the skull through the vertex without intravenous contrast.  COMPARISON:  03/05/2014  FINDINGS: Generalized atrophy.  Normal ventricular morphology.  No midline shift or mass effect.  Small vessel chronic ischemic changes of deep cerebral white matter.  Old lacunar infarct anterior RIGHT basal ganglia and anterior limb of RIGHT internal capsule.  No intracranial hemorrhage, mass lesion, or acute infarction.  No extra-axial fluid collections.  Visualized paranasal sinuses and mastoid air cells clear.  Bones unremarkable.  IMPRESSION: Atrophy with small vessel chronic ischemic changes of deep cerebral white matter.  Old lacunar infarct at anterior RIGHT basal ganglia/anterior limb RIGHT internal capsule.  No acute intracranial abnormalities.   Electronically Signed   By: Ulyses Southward M.D.   On: 09/28/2014 17:20   I have personally reviewed and evaluated these images and lab results as part of my medical decision-making.   EKG Interpretation   Date/Time:  Sunday September 28 2014 15:54:51 EDT Ventricular Rate:  67 PR Interval:  204 QRS Duration: 142 QT Interval:  492 QTC Calculation: 519 R Axis:   -81 Text Interpretation:  Sinus rhythm Probable left atrial enlargement  Nonspecific IVCD with LAD Left ventricular hypertrophy Anterior infarct,  old No significant change since last tracing Confirmed by Lincoln Brigham  [phone]) on 09/28/2014 3:58:06 PM      MDM   Final diagnoses:  Fall, initial encounter  Head contusion,  initial encounter  Near syncope    Patient with history of CHF and seizure disorder here for evaluation of head contusion following near syncopal event. Her last EF was in the mid 30s. She has no symptoms currently and wishes to leave the department. Discussed with patient and recommendations for workup and she does agree for lab imaging. She appears to be euvolemic on examination but has slight elevation in her creatinine as well as her BUN. She's not having any hematochezia or melena, no history of GI bleeds. I repeat evaluation in the emergency department patient is asymptomatic and wishing to go home. Discussed with patient option for observation in the hospital given near syncope with cardiac history as well as bump in her kidney function. Patient  declines at this time. Discussed close outpatient follow-up as well as return questions.    Tilden Fossa, MD 09/29/14 [phone]

## 2014-09-28 NOTE — ED Notes (Signed)
Pt off unit with CT 

## 2014-09-28 NOTE — Discharge Instructions (Signed)
Hold your lasix dose tonight and drink extra fluids.  Follow up with your family doctor closely and get your kidney function rechecked in the next few days.  Come back to the ER immediately if you develop chest pain, pass out, bloody or black stools or any new or worrisome symptoms.     Head Injury You have received a head injury. It does not appear serious at this time. Headaches and vomiting are common following head injury. It should be easy to awaken from sleeping. Sometimes it is necessary for you to stay in the emergency department for a while for observation. Sometimes admission to the hospital may be needed. After injuries such as yours, most problems occur within the first 24 hours, but side effects may occur up to 7-10 days after the injury. It is important for you to carefully monitor your condition and contact your health care provider or seek immediate medical care if there is a change in your condition. WHAT ARE THE TYPES OF HEAD INJURIES? Head injuries can be as minor as a bump. Some head injuries can be more severe. More severe head injuries include:  A jarring injury to the brain (concussion).  A bruise of the brain (contusion). This mean there is bleeding in the brain that can cause swelling.  A cracked skull (skull fracture).  Bleeding in the brain that collects, clots, and forms a bump (hematoma). WHAT CAUSES A HEAD INJURY? A serious head injury is most likely to happen to someone who is in a car wreck and is not wearing a seat belt. Other causes of major head injuries include bicycle or motorcycle accidents, sports injuries, and falls. HOW ARE HEAD INJURIES DIAGNOSED? A complete history of the event leading to the injury and your current symptoms will be helpful in diagnosing head injuries. Many times, pictures of the brain, such as CT or MRI are needed to see the extent of the injury. Often, an overnight hospital stay is necessary for observation.  WHEN SHOULD I SEEK  IMMEDIATE MEDICAL CARE?  You should get help right away if:  You have confusion or drowsiness.  You feel sick to your stomach (nauseous) or have continued, forceful vomiting.  You have dizziness or unsteadiness that is getting worse.  You have severe, continued headaches not relieved by medicine. Only take over-the-counter or prescription medicines for pain, fever, or discomfort as directed by your health care provider.  You do not have normal function of the arms or legs or are unable to walk.  You notice changes in the black spots in the center of the colored part of your eye (pupil).  You have a clear or bloody fluid coming from your nose or ears.  You have a loss of vision. During the next 24 hours after the injury, you must stay with someone who can watch you for the warning signs. This person should contact local emergency services (911 in the [place] if you have seizures, you become unconscious, or you are unable to wake up. HOW CAN I PREVENT A HEAD INJURY IN THE FUTURE? The most important factor for preventing major head injuries is avoiding motor vehicle accidents. To minimize the potential for damage to your head, it is crucial to wear seat belts while riding in motor vehicles. Wearing helmets while bike riding and playing collision sports (like football) is also helpful. Also, avoiding dangerous activities around the house will further help reduce your risk of head injury.  WHEN CAN I RETURN TO NORMAL ACTIVITIES  AND ATHLETICS? You should be reevaluated by your health care provider before returning to these activities. If you have any of the following symptoms, you should not return to activities or contact sports until 1 week after the symptoms have stopped:  Persistent headache.  Dizziness or vertigo.  Poor attention and concentration.  Confusion.  Memory problems.  Nausea or vomiting.  Fatigue or tire easily.  Irritability.  Intolerant of bright lights or loud  noises.  Anxiety or depression.  Disturbed sleep. MAKE SURE YOU:   Understand these instructions.  Will watch your condition.  Will get help right away if you are not doing well or get worse. Document Released: 01/17/2005 Document Revised: 01/22/2013 Document Reviewed: 09/24/2012 [facility] Patient Information 2015 [place], [place]. This information is not intended to replace advice given to you by your health care provider. Make sure you discuss any questions you have with your health care provider.  Syncope Syncope is a medical term for fainting or passing out. This means you lose consciousness and drop to the ground. People are generally unconscious for less than 5 minutes. You may have some muscle twitches for up to 15 seconds before waking up and returning to normal. Syncope occurs more often in older adults, but it can happen to anyone. While most causes of syncope are not dangerous, syncope can be a sign of a serious medical problem. It is important to seek medical care.  CAUSES  Syncope is caused by a sudden drop in blood flow to the brain. The specific cause is often not determined. Factors that can bring on syncope include:  Taking medicines that lower blood pressure.  Sudden changes in posture, such as standing up quickly.  Taking more medicine than prescribed.  Standing in one place for too long.  Seizure disorders.  Dehydration and excessive exposure to heat.  Low blood sugar (hypoglycemia).  Straining to have a bowel movement.  Heart disease, irregular heartbeat, or other circulatory problems.  Fear, emotional distress, seeing blood, or severe pain. SYMPTOMS  Right before fainting, you may:  Feel dizzy or light-headed.  Feel nauseous.  See all white or all black in your field of vision.  Have cold, clammy skin. DIAGNOSIS  Your health care provider will ask about your symptoms, perform a physical exam, and perform an electrocardiogram (ECG) to record the  electrical activity of your heart. Your health care provider may also perform other heart or blood tests to determine the cause of your syncope which may include:  Transthoracic echocardiogram (TTE). During echocardiography, sound waves are used to evaluate how blood flows through your heart.  Transesophageal echocardiogram (TEE).  Cardiac monitoring. This allows your health care provider to monitor your heart rate and rhythm in real time.  Holter monitor. This is a portable device that records your heartbeat and can help diagnose heart arrhythmias. It allows your health care provider to track your heart activity for several days, if needed.  Stress tests by exercise or by giving medicine that makes the heart beat faster. TREATMENT  In most cases, no treatment is needed. Depending on the cause of your syncope, your health care provider may recommend changing or stopping some of your medicines. HOME CARE INSTRUCTIONS  Have someone stay with you until you feel stable.  Do not drive, use machinery, or play sports until your health care provider says it is okay.  Keep all follow-up appointments as directed by your health care provider.  Lie down right away if you start feeling like  you might faint. Breathe deeply and steadily. Wait until all the symptoms have passed.  Drink enough fluids to keep your urine clear or pale yellow.  If you are taking blood pressure or heart medicine, get up slowly and take several minutes to sit and then stand. This can reduce dizziness. SEEK IMMEDIATE MEDICAL CARE IF:   You have a severe headache.  You have unusual pain in the chest, abdomen, or back.  You are bleeding from your mouth or rectum, or you have black or tarry stool.  You have an irregular or very fast heartbeat.  You have pain with breathing.  You have repeated fainting or seizure-like jerking during an episode.  You faint when sitting or lying down.  You have confusion.  You have  trouble walking.  You have severe weakness.  You have vision problems. If you fainted, call your local emergency services (911 in [place] Do not drive yourself to the hospital.  MAKE SURE YOU:  Understand these instructions.  Will watch your condition.  Will get help right away if you are not doing well or get worse. Document Released: 01/17/2005 Document Revised: 01/22/2013 Document Reviewed: 03/18/2011 [facility] Patient Information 2015 [place], [place]. This information is not intended to replace advice given to you by your health care provider. Make sure you discuss any questions you have with your health care provider.

## 2014-09-28 NOTE — ED Notes (Signed)
Onset 12:45 pm today pt went to [facility] had bm that was normal, strained a lot, felt hot and dizzy, sat on toilet for few minutes, got up and walked out of [facility] and fell on floor, hitting right forehead at hairline on door.quarter size lump and slight redness noted.  No dizziness at this time.  NO other s/s noted.

## 2014-09-28 NOTE — ED Notes (Signed)
Pt ambulated to restroom with stand by assist.  

## 2014-09-30 DIAGNOSIS — E46 Unspecified protein-calorie malnutrition: Secondary | ICD-10-CM | POA: Diagnosis not present

## 2014-09-30 DIAGNOSIS — R55 Syncope and collapse: Secondary | ICD-10-CM | POA: Diagnosis not present

## 2014-10-09 ENCOUNTER — Telehealth: Payer: Self-pay | Admitting: Adult Health

## 2014-10-09 DIAGNOSIS — R296 Repeated falls: Secondary | ICD-10-CM | POA: Diagnosis not present

## 2014-10-09 DIAGNOSIS — R279 Unspecified lack of coordination: Secondary | ICD-10-CM | POA: Diagnosis not present

## 2014-10-09 DIAGNOSIS — I129 Hypertensive chronic kidney disease with stage 1 through stage 4 chronic kidney disease, or unspecified chronic kidney disease: Secondary | ICD-10-CM | POA: Diagnosis not present

## 2014-10-09 DIAGNOSIS — R55 Syncope and collapse: Secondary | ICD-10-CM | POA: Diagnosis not present

## 2014-10-09 DIAGNOSIS — N183 Chronic kidney disease, stage 3 (moderate): Secondary | ICD-10-CM | POA: Diagnosis not present

## 2014-10-09 DIAGNOSIS — G40909 Epilepsy, unspecified, not intractable, without status epilepticus: Secondary | ICD-10-CM | POA: Diagnosis not present

## 2014-10-09 DIAGNOSIS — R3919 Other difficulties with micturition: Secondary | ICD-10-CM | POA: Diagnosis not present

## 2014-10-09 NOTE — Telephone Encounter (Signed)
levETIRAcetam (KEPPRA) 500 MG tablet , [facility] is calling and they just want to get clarification on the keppra dosaged. They have papers that say something different that what the Rx states . Please call Iona Beard [phone] Office number: [phone]

## 2014-10-09 NOTE — Telephone Encounter (Signed)
Called and spoke to [facility] and relayed patient should be taking 5oo mg keppra one at bedtime . Akiako understood direction's and would call back if she needed use.

## 2014-10-10 DIAGNOSIS — N183 Chronic kidney disease, stage 3 (moderate): Secondary | ICD-10-CM | POA: Diagnosis not present

## 2014-10-10 DIAGNOSIS — I129 Hypertensive chronic kidney disease with stage 1 through stage 4 chronic kidney disease, or unspecified chronic kidney disease: Secondary | ICD-10-CM | POA: Diagnosis not present

## 2014-10-10 DIAGNOSIS — R55 Syncope and collapse: Secondary | ICD-10-CM | POA: Diagnosis not present

## 2014-10-10 DIAGNOSIS — R2689 Other abnormalities of gait and mobility: Secondary | ICD-10-CM | POA: Diagnosis not present

## 2014-10-10 DIAGNOSIS — G40909 Epilepsy, unspecified, not intractable, without status epilepticus: Secondary | ICD-10-CM | POA: Diagnosis not present

## 2014-10-10 DIAGNOSIS — R296 Repeated falls: Secondary | ICD-10-CM | POA: Diagnosis not present

## 2014-10-10 DIAGNOSIS — R3919 Other difficulties with micturition: Secondary | ICD-10-CM | POA: Diagnosis not present

## 2014-10-10 DIAGNOSIS — I6789 Other cerebrovascular disease: Secondary | ICD-10-CM | POA: Diagnosis not present

## 2014-10-10 DIAGNOSIS — R279 Unspecified lack of coordination: Secondary | ICD-10-CM | POA: Diagnosis not present

## 2014-10-13 DIAGNOSIS — I129 Hypertensive chronic kidney disease with stage 1 through stage 4 chronic kidney disease, or unspecified chronic kidney disease: Secondary | ICD-10-CM | POA: Diagnosis not present

## 2014-10-13 DIAGNOSIS — D631 Anemia in chronic kidney disease: Secondary | ICD-10-CM | POA: Diagnosis not present

## 2014-10-13 DIAGNOSIS — N184 Chronic kidney disease, stage 4 (severe): Secondary | ICD-10-CM | POA: Diagnosis not present

## 2014-10-13 DIAGNOSIS — N2581 Secondary hyperparathyroidism of renal origin: Secondary | ICD-10-CM | POA: Diagnosis not present

## 2014-10-13 DIAGNOSIS — N183 Chronic kidney disease, stage 3 (moderate): Secondary | ICD-10-CM | POA: Diagnosis not present

## 2014-10-14 DIAGNOSIS — G40909 Epilepsy, unspecified, not intractable, without status epilepticus: Secondary | ICD-10-CM | POA: Diagnosis not present

## 2014-10-14 DIAGNOSIS — R3919 Other difficulties with micturition: Secondary | ICD-10-CM | POA: Diagnosis not present

## 2014-10-14 DIAGNOSIS — R55 Syncope and collapse: Secondary | ICD-10-CM | POA: Diagnosis not present

## 2014-10-14 DIAGNOSIS — R279 Unspecified lack of coordination: Secondary | ICD-10-CM | POA: Diagnosis not present

## 2014-10-14 DIAGNOSIS — N183 Chronic kidney disease, stage 3 (moderate): Secondary | ICD-10-CM | POA: Diagnosis not present

## 2014-10-14 DIAGNOSIS — I129 Hypertensive chronic kidney disease with stage 1 through stage 4 chronic kidney disease, or unspecified chronic kidney disease: Secondary | ICD-10-CM | POA: Diagnosis not present

## 2014-10-14 DIAGNOSIS — R296 Repeated falls: Secondary | ICD-10-CM | POA: Diagnosis not present

## 2014-10-16 DIAGNOSIS — E875 Hyperkalemia: Secondary | ICD-10-CM | POA: Diagnosis not present

## 2014-10-16 DIAGNOSIS — R296 Repeated falls: Secondary | ICD-10-CM | POA: Diagnosis not present

## 2014-10-16 DIAGNOSIS — G40909 Epilepsy, unspecified, not intractable, without status epilepticus: Secondary | ICD-10-CM | POA: Diagnosis not present

## 2014-10-16 DIAGNOSIS — R3919 Other difficulties with micturition: Secondary | ICD-10-CM | POA: Diagnosis not present

## 2014-10-16 DIAGNOSIS — R55 Syncope and collapse: Secondary | ICD-10-CM | POA: Diagnosis not present

## 2014-10-16 DIAGNOSIS — I129 Hypertensive chronic kidney disease with stage 1 through stage 4 chronic kidney disease, or unspecified chronic kidney disease: Secondary | ICD-10-CM | POA: Diagnosis not present

## 2014-10-16 DIAGNOSIS — R279 Unspecified lack of coordination: Secondary | ICD-10-CM | POA: Diagnosis not present

## 2014-10-16 DIAGNOSIS — N183 Chronic kidney disease, stage 3 (moderate): Secondary | ICD-10-CM | POA: Diagnosis not present

## 2014-10-17 DIAGNOSIS — R55 Syncope and collapse: Secondary | ICD-10-CM | POA: Diagnosis not present

## 2014-10-17 DIAGNOSIS — N183 Chronic kidney disease, stage 3 (moderate): Secondary | ICD-10-CM | POA: Diagnosis not present

## 2014-10-17 DIAGNOSIS — G40909 Epilepsy, unspecified, not intractable, without status epilepticus: Secondary | ICD-10-CM | POA: Diagnosis not present

## 2014-10-17 DIAGNOSIS — R296 Repeated falls: Secondary | ICD-10-CM | POA: Diagnosis not present

## 2014-10-17 DIAGNOSIS — I129 Hypertensive chronic kidney disease with stage 1 through stage 4 chronic kidney disease, or unspecified chronic kidney disease: Secondary | ICD-10-CM | POA: Diagnosis not present

## 2014-10-17 DIAGNOSIS — R3919 Other difficulties with micturition: Secondary | ICD-10-CM | POA: Diagnosis not present

## 2014-10-17 DIAGNOSIS — R279 Unspecified lack of coordination: Secondary | ICD-10-CM | POA: Diagnosis not present

## 2014-10-21 DIAGNOSIS — R55 Syncope and collapse: Secondary | ICD-10-CM | POA: Diagnosis not present

## 2014-10-21 DIAGNOSIS — N183 Chronic kidney disease, stage 3 (moderate): Secondary | ICD-10-CM | POA: Diagnosis not present

## 2014-10-21 DIAGNOSIS — R296 Repeated falls: Secondary | ICD-10-CM | POA: Diagnosis not present

## 2014-10-21 DIAGNOSIS — G40909 Epilepsy, unspecified, not intractable, without status epilepticus: Secondary | ICD-10-CM | POA: Diagnosis not present

## 2014-10-21 DIAGNOSIS — R3919 Other difficulties with micturition: Secondary | ICD-10-CM | POA: Diagnosis not present

## 2014-10-21 DIAGNOSIS — R279 Unspecified lack of coordination: Secondary | ICD-10-CM | POA: Diagnosis not present

## 2014-10-21 DIAGNOSIS — I129 Hypertensive chronic kidney disease with stage 1 through stage 4 chronic kidney disease, or unspecified chronic kidney disease: Secondary | ICD-10-CM | POA: Diagnosis not present

## 2014-10-24 DIAGNOSIS — N183 Chronic kidney disease, stage 3 (moderate): Secondary | ICD-10-CM | POA: Diagnosis not present

## 2014-10-24 DIAGNOSIS — G40909 Epilepsy, unspecified, not intractable, without status epilepticus: Secondary | ICD-10-CM | POA: Diagnosis not present

## 2014-10-24 DIAGNOSIS — R3919 Other difficulties with micturition: Secondary | ICD-10-CM | POA: Diagnosis not present

## 2014-10-24 DIAGNOSIS — R279 Unspecified lack of coordination: Secondary | ICD-10-CM | POA: Diagnosis not present

## 2014-10-24 DIAGNOSIS — R296 Repeated falls: Secondary | ICD-10-CM | POA: Diagnosis not present

## 2014-10-24 DIAGNOSIS — R55 Syncope and collapse: Secondary | ICD-10-CM | POA: Diagnosis not present

## 2014-10-24 DIAGNOSIS — I129 Hypertensive chronic kidney disease with stage 1 through stage 4 chronic kidney disease, or unspecified chronic kidney disease: Secondary | ICD-10-CM | POA: Diagnosis not present

## 2014-10-27 DIAGNOSIS — N183 Chronic kidney disease, stage 3 (moderate): Secondary | ICD-10-CM | POA: Diagnosis not present

## 2014-10-27 DIAGNOSIS — G40909 Epilepsy, unspecified, not intractable, without status epilepticus: Secondary | ICD-10-CM | POA: Diagnosis not present

## 2014-10-27 DIAGNOSIS — I129 Hypertensive chronic kidney disease with stage 1 through stage 4 chronic kidney disease, or unspecified chronic kidney disease: Secondary | ICD-10-CM | POA: Diagnosis not present

## 2014-10-27 DIAGNOSIS — R279 Unspecified lack of coordination: Secondary | ICD-10-CM | POA: Diagnosis not present

## 2014-10-27 DIAGNOSIS — R296 Repeated falls: Secondary | ICD-10-CM | POA: Diagnosis not present

## 2014-10-27 DIAGNOSIS — R55 Syncope and collapse: Secondary | ICD-10-CM | POA: Diagnosis not present

## 2014-10-27 DIAGNOSIS — R3919 Other difficulties with micturition: Secondary | ICD-10-CM | POA: Diagnosis not present

## 2014-10-28 DIAGNOSIS — R3919 Other difficulties with micturition: Secondary | ICD-10-CM | POA: Diagnosis not present

## 2014-10-28 DIAGNOSIS — R55 Syncope and collapse: Secondary | ICD-10-CM | POA: Diagnosis not present

## 2014-10-28 DIAGNOSIS — G40909 Epilepsy, unspecified, not intractable, without status epilepticus: Secondary | ICD-10-CM | POA: Diagnosis not present

## 2014-10-28 DIAGNOSIS — R296 Repeated falls: Secondary | ICD-10-CM | POA: Diagnosis not present

## 2014-10-28 DIAGNOSIS — R279 Unspecified lack of coordination: Secondary | ICD-10-CM | POA: Diagnosis not present

## 2014-10-28 DIAGNOSIS — N183 Chronic kidney disease, stage 3 (moderate): Secondary | ICD-10-CM | POA: Diagnosis not present

## 2014-10-28 DIAGNOSIS — I129 Hypertensive chronic kidney disease with stage 1 through stage 4 chronic kidney disease, or unspecified chronic kidney disease: Secondary | ICD-10-CM | POA: Diagnosis not present

## 2014-11-03 DIAGNOSIS — I129 Hypertensive chronic kidney disease with stage 1 through stage 4 chronic kidney disease, or unspecified chronic kidney disease: Secondary | ICD-10-CM | POA: Diagnosis not present

## 2014-11-03 DIAGNOSIS — G40909 Epilepsy, unspecified, not intractable, without status epilepticus: Secondary | ICD-10-CM | POA: Diagnosis not present

## 2014-11-03 DIAGNOSIS — N183 Chronic kidney disease, stage 3 (moderate): Secondary | ICD-10-CM | POA: Diagnosis not present

## 2014-11-03 DIAGNOSIS — R279 Unspecified lack of coordination: Secondary | ICD-10-CM | POA: Diagnosis not present

## 2014-11-03 DIAGNOSIS — R296 Repeated falls: Secondary | ICD-10-CM | POA: Diagnosis not present

## 2014-11-03 DIAGNOSIS — R55 Syncope and collapse: Secondary | ICD-10-CM | POA: Diagnosis not present

## 2014-11-03 DIAGNOSIS — R3919 Other difficulties with micturition: Secondary | ICD-10-CM | POA: Diagnosis not present

## 2014-11-04 ENCOUNTER — Ambulatory Visit
Admission: RE | Admit: 2014-11-04 | Discharge: 2014-11-04 | Disposition: A | Discharge status: Home / Self Care | Payer: Commercial Managed Care - HMO | Source: Ambulatory Visit | Attending: Family Medicine | Admitting: Family Medicine

## 2014-11-04 DIAGNOSIS — Z1231 Encounter for screening mammogram for malignant neoplasm of breast: Secondary | ICD-10-CM | POA: Diagnosis not present

## 2014-11-11 DIAGNOSIS — Z1211 Encounter for screening for malignant neoplasm of colon: Secondary | ICD-10-CM | POA: Diagnosis not present

## 2014-12-15 DIAGNOSIS — Z1211 Encounter for screening for malignant neoplasm of colon: Secondary | ICD-10-CM | POA: Diagnosis not present

## 2014-12-22 ENCOUNTER — Encounter: Payer: Self-pay | Admitting: Adult Health

## 2014-12-22 ENCOUNTER — Ambulatory Visit (INDEPENDENT_AMBULATORY_CARE_PROVIDER_SITE_OTHER): Payer: Commercial Managed Care - HMO | Admitting: Adult Health

## 2014-12-22 VITALS — BP 149/95 | HR 73 | Ht 61.0 in | Wt 83.5 lb

## 2014-12-22 DIAGNOSIS — R569 Unspecified convulsions: Secondary | ICD-10-CM

## 2014-12-22 DIAGNOSIS — R413 Other amnesia: Secondary | ICD-10-CM

## 2014-12-22 NOTE — Patient Instructions (Signed)
Continue Vimpat and Keppra If you have any seizures please let us know Follow-up with PCP in regards to left abdominal pain If your symptoms worsen or you develop new symptoms please let us know.

## 2014-12-22 NOTE — Progress Notes (Signed)
PATIENT: [name] DOB: 01-03-1941  REASON FOR VISIT: follow up- seizures HISTORY FROM: patient  HISTORY OF PRESENT ILLNESS: [name] is a 74 year old female with a history of seizures. She returns today for follow-up. The patient is currently taking Vimpat 150 mg twice a day as well as Keppra 500 mg at bedtime. She is tolerating these medications well. Denies any seizure activity since the last visit. She is able to complete all ADLs independently. She states that she typically does not drive. However if she does drive she limits it to short distances. Patient reports that her memory has remained stable. Denies any changes in her mood or behavior. Denies any new neurological symptoms. She reports she is having left abdominal discomfort when she eats. She states that she has had an x-ray that was normal. She has not followed up with her primary care provider. She returns today for an evaluation.  HISTORY 08/18/14: [name] is a 74 year old female with a history of seizures. She returns today for follow-up. The patient is currently taken Vimpat 100 mg twice a day and tolerating it well. The patient decreased her Keppra dose to half a tablet at bedtime. She denies any seizure activity since the last visit. She continues to complete all ADLs independently. She does not operate a motor vehicle. She denies any changes with her gait or balance. Patient states that she sleeps well at night. The patient had to decrease her dose of Keppra because she was sleepy all the time. Family member reports that her sleepiness has improved since she decreased her Keppra dose. Patient feels that her memory has remained stable. Denies any changes with her memory. Her family member reports that "she does not forget anything." She denies any new medical issues. She returns today for an evaluation.  HISTORY 04/01/14: [name] is a 74 year old female with a history of seizures. She returns today for follow-up. The  patient is currently taken Vimpat 100 mg twice a day. And Keppra 750 mg twice a day. She reports that her seizures was controlled until the beginning of February. Patient states that she fell out of bed at the beginning of February and was taken to the hospital. While she was having x-rays completed she had a seizure event. She was later discharged from the hospital but had a seizure on the way out. She was then admitted for several days. Her Keppra was increased to 750 mg twice a day. Her son also reports that she was diagnosed with a urinary tract infection while in the hospital. Since being discharged from the hospital she has not had any additional seizure events. does not operate a motor vehicle. Denies any changes with her gait or balance. She states that she has a therapist that works with her. She uses a cane for ambulation. No new medical issues since last seen.  REVIEW OF SYSTEMS: Out of a complete 14 system review of symptoms, the patient complains only of the following symptoms, and all other reviewed systems are negative.  Constipation, shortness of breath, pain in the side, daytime sleepiness  ALLERGIES: No Known Allergies  HOME MEDICATIONS: Outpatient Prescriptions Prior to Visit  Medication Sig Dispense Refill  . allopurinol (ZYLOPRIM) 100 MG tablet Take 200 mg by mouth daily.   5  . aspirin 325 MG tablet Take 325 mg by mouth every morning.     . calcitRIOL (ROCALTROL) 0.25 MCG capsule Take 0.25 mcg by mouth daily.    . carvedilol (COREG) 6.25 MG  tablet Take 1 tablet (6.25 mg total) by mouth 2 (two) times daily with a meal. 60 tablet 3  . furosemide (LASIX) 20 MG tablet Take 2 tablets (40 mg total) by mouth daily. 90 tablet 1  . HYDROcodone-acetaminophen (NORCO/VICODIN) 5-325 MG per tablet Take 1 tablet by mouth every 6 (six) hours as needed for moderate pain or severe pain. 30 tablet 0  . Lacosamide (VIMPAT) 150 MG TABS Take 1 tablet (150 mg total) by mouth 2 (two) times daily. 60  tablet 5  . levETIRAcetam (KEPPRA) 500 MG tablet Take 1 tablet (500 mg total) by mouth at bedtime. 90 tablet 1   No facility-administered medications prior to visit.    PAST MEDICAL HISTORY: Past Medical History  Diagnosis Date  . MI (myocardial infarction)   . Hypertension   . Stroke   . Congestive heart failure (CHF)     EF 35% Nov 2014  . Epilepsy   . Smoker     PAST SURGICAL HISTORY: Past Surgical History  Procedure Laterality Date  . No past surgeries      FAMILY HISTORY: Family History  Problem Relation Age of Onset  . Cancer Father   . Anemia Daughter     SOCIAL HISTORY: Social History   Social History  . Marital Status: Single    Spouse Name: N/A  . Number of Children: 3  . Years of Education: 13   Occupational History  .      Retired   Social History Main Topics  . Smoking status: Current Some Day Smoker -- 0.10 packs/day    Types: Cigarettes  . Smokeless tobacco: Never Used  . Alcohol Use: 0.0 oz/week    0 Standard drinks or equivalent per week     Comment: occasional  . Drug Use: No  . Sexual Activity: Not on file   Other Topics Concern  . Not on file   Social History Narrative   Patient lives at home alone.    Retired.   Education one year of college.   Left handed.   Caffeine one cup of coffee daily and one cup of tea.      PHYSICAL EXAM  Filed Vitals:   12/22/14 0750  BP: 149/95  Pulse: 73  Height:  (1.549 m)  Weight: 83 lb 8 oz (37.875 kg)   Body mass index is 15.79 kg/(m^2).  MMSE - Mini Mental State Exam 12/22/2014 04/01/2014  Orientation to time 5 5  Orientation to Place 5 5  Registration 3 3  Attention/ Calculation 5 5  Recall 2 3  Language- name 2 objects 2 2  Language- repeat 1 1  Language- follow 3 step command 2 3  Language- read & follow direction 1 1  Write a sentence 1 1  Copy design 0 0  Total score 27 29     Generalized: Well developed, in no acute distress   Neurological examination    Mentation: Alert. Follows all commands speech and language fluent Cranial nerve II-XII: Pupils were equal round reactive to light. Extraocular movements were full, visual field were full on confrontational test. Facial sensation and strength were normal. Uvula tongue midline. Head turning and shoulder shrug  were normal and symmetric. Motor: The motor testing reveals 5 over 5 strength of all 4 extremities. Good symmetric motor tone is noted throughout.  Sensory: Sensory testing is intact to soft touch on all 4 extremities. No evidence of extinction is noted.  Coordination: Cerebellar testing reveals good finger-nose-finger and  heel-to-shin bilaterally.  Gait and station: Gait is normal. Tandem gait is normal. Romberg is negative. No drift is seen.  Reflexes: Deep tendon reflexes are symmetric and normal bilaterally.   DIAGNOSTIC DATA (LABS, IMAGING, TESTING) - I reviewed patient records, labs, notes, testing and imaging myself where available.  Lab Results  Component Value Date   WBC 6.8 03/07/2014   HGB 13.9 09/28/2014   HCT 41.0 09/28/2014   MCV 98.5 03/07/2014   PLT 212 03/07/2014      Component Value Date/Time   NA 132* 09/28/2014 1631   K 4.2 09/28/2014 1631   CL 99* 09/28/2014 1631   CO2 25 03/09/2014 0625   GLUCOSE 105* 09/28/2014 1631   BUN 51* 09/28/2014 1631   CREATININE 1.70* 09/28/2014 1631   CALCIUM 8.6 03/09/2014 0625   PROT 7.5 03/06/2014 0606   ALBUMIN 3.8 03/06/2014 0606   AST 30 03/06/2014 0606   ALT 25 03/06/2014 0606   ALKPHOS 71 03/06/2014 0606   BILITOT 0.7 03/06/2014 0606   GFRNONAA 42* 03/09/2014 0625   GFRAA 49* 03/09/2014 0625   Lab Results  Component Value Date   CHOL 175 03/06/2014   HDL 68 03/06/2014   LDLCALC 92 03/06/2014   TRIG 73 03/06/2014   CHOLHDL 2.6 03/06/2014   Lab Results  Component Value Date   HGBA1C 5.8* 03/06/2014    Lab Results  Component Value Date   TSH 0.754 03/06/2014      ASSESSMENT AND PLAN 74 y.o. year  old female  has a past medical history of MI (myocardial infarction); Hypertension; Stroke; Congestive heart failure (CHF); Epilepsy; and Smoker. here with:  1. Seizures 2. Memory disturbance  Overall the patient is doing well. She will continue on Vimpat 150 mg twice a day and Keppra 500 mg at bedtime. Patient advised that if she has any seizure events she should let us know. Follow up with your primary care regarding left abdominal pain. Memory score is stable. We will continue to monitor. She will follow-up in 6 months with Dr. Pearlean Brownie.     Butch Penny, MSN, NP-C 12/22/2014, 7:50 AM [facility] [address], [address] [place], [place] [address] [phone]

## 2014-12-22 NOTE — Progress Notes (Signed)
I agree with the above plan 

## 2015-01-21 DIAGNOSIS — Z1211 Encounter for screening for malignant neoplasm of colon: Secondary | ICD-10-CM | POA: Diagnosis not present

## 2015-02-11 DIAGNOSIS — Z1211 Encounter for screening for malignant neoplasm of colon: Secondary | ICD-10-CM | POA: Diagnosis not present

## 2015-02-24 ENCOUNTER — Other Ambulatory Visit: Payer: Self-pay

## 2015-02-24 MED ORDER — VIMPAT 100 MG PO TABS: 1.5000 | ORAL_TABLET | Freq: Two times a day (BID) | ORAL | Status: DC

## 2015-04-07 DIAGNOSIS — K2970 Gastritis, unspecified, without bleeding: Secondary | ICD-10-CM | POA: Diagnosis not present

## 2015-04-07 DIAGNOSIS — R109 Unspecified abdominal pain: Secondary | ICD-10-CM | POA: Diagnosis not present

## 2015-04-13 DIAGNOSIS — D631 Anemia in chronic kidney disease: Secondary | ICD-10-CM | POA: Diagnosis not present

## 2015-04-13 DIAGNOSIS — G40909 Epilepsy, unspecified, not intractable, without status epilepticus: Secondary | ICD-10-CM | POA: Diagnosis not present

## 2015-04-13 DIAGNOSIS — I129 Hypertensive chronic kidney disease with stage 1 through stage 4 chronic kidney disease, or unspecified chronic kidney disease: Secondary | ICD-10-CM | POA: Diagnosis not present

## 2015-04-13 DIAGNOSIS — M109 Gout, unspecified: Secondary | ICD-10-CM | POA: Diagnosis not present

## 2015-04-13 DIAGNOSIS — F17200 Nicotine dependence, unspecified, uncomplicated: Secondary | ICD-10-CM | POA: Diagnosis not present

## 2015-04-13 DIAGNOSIS — N184 Chronic kidney disease, stage 4 (severe): Secondary | ICD-10-CM | POA: Diagnosis not present

## 2015-04-13 DIAGNOSIS — R748 Abnormal levels of other serum enzymes: Secondary | ICD-10-CM | POA: Diagnosis not present

## 2015-04-13 DIAGNOSIS — N2581 Secondary hyperparathyroidism of renal origin: Secondary | ICD-10-CM | POA: Diagnosis not present

## 2015-04-13 DIAGNOSIS — Z23 Encounter for immunization: Secondary | ICD-10-CM | POA: Diagnosis not present

## 2015-04-13 DIAGNOSIS — N183 Chronic kidney disease, stage 3 (moderate): Secondary | ICD-10-CM | POA: Diagnosis not present

## 2015-04-30 DIAGNOSIS — N183 Chronic kidney disease, stage 3 (moderate): Secondary | ICD-10-CM | POA: Diagnosis not present

## 2015-04-30 DIAGNOSIS — I129 Hypertensive chronic kidney disease with stage 1 through stage 4 chronic kidney disease, or unspecified chronic kidney disease: Secondary | ICD-10-CM | POA: Diagnosis not present

## 2015-05-04 DIAGNOSIS — M5489 Other dorsalgia: Secondary | ICD-10-CM | POA: Diagnosis not present

## 2015-05-11 DIAGNOSIS — M546 Pain in thoracic spine: Secondary | ICD-10-CM | POA: Diagnosis not present

## 2015-05-11 DIAGNOSIS — M545 Low back pain: Secondary | ICD-10-CM | POA: Diagnosis not present

## 2015-05-12 ENCOUNTER — Ambulatory Visit
Admission: RE | Admit: 2015-05-12 | Discharge: 2015-05-12 | Disposition: A | Discharge status: Home / Self Care | Payer: Commercial Managed Care - HMO | Source: Ambulatory Visit | Attending: Family Medicine | Admitting: Family Medicine

## 2015-05-12 ENCOUNTER — Other Ambulatory Visit: Payer: Self-pay | Admitting: Family Medicine

## 2015-05-12 DIAGNOSIS — M545 Low back pain: Secondary | ICD-10-CM | POA: Diagnosis not present

## 2015-05-12 DIAGNOSIS — J90 Pleural effusion, not elsewhere classified: Secondary | ICD-10-CM | POA: Diagnosis not present

## 2015-05-14 ENCOUNTER — Encounter (HOSPITAL_COMMUNITY): Payer: Self-pay

## 2015-05-14 ENCOUNTER — Other Ambulatory Visit (HOSPITAL_COMMUNITY): Payer: Commercial Managed Care - HMO

## 2015-05-14 ENCOUNTER — Inpatient Hospital Stay (HOSPITAL_COMMUNITY)
Admission: EM | Admit: 2015-05-14 | Discharge: 2015-05-23 | DRG: 291 | Disposition: A | Discharge status: Skilled Nursing Facility | Payer: Commercial Managed Care - HMO | Attending: Internal Medicine | Admitting: Internal Medicine

## 2015-05-14 DIAGNOSIS — I071 Rheumatic tricuspid insufficiency: Secondary | ICD-10-CM | POA: Diagnosis present

## 2015-05-14 DIAGNOSIS — I130 Hypertensive heart and chronic kidney disease with heart failure and stage 1 through stage 4 chronic kidney disease, or unspecified chronic kidney disease: Principal | ICD-10-CM | POA: Diagnosis present

## 2015-05-14 DIAGNOSIS — J189 Pneumonia, unspecified organism: Secondary | ICD-10-CM | POA: Diagnosis not present

## 2015-05-14 DIAGNOSIS — F17210 Nicotine dependence, cigarettes, uncomplicated: Secondary | ICD-10-CM | POA: Diagnosis not present

## 2015-05-14 DIAGNOSIS — R110 Nausea: Secondary | ICD-10-CM | POA: Diagnosis not present

## 2015-05-14 DIAGNOSIS — F0390 Unspecified dementia without behavioral disturbance: Secondary | ICD-10-CM | POA: Diagnosis not present

## 2015-05-14 DIAGNOSIS — I255 Ischemic cardiomyopathy: Secondary | ICD-10-CM | POA: Diagnosis present

## 2015-05-14 DIAGNOSIS — K761 Chronic passive congestion of liver: Secondary | ICD-10-CM | POA: Diagnosis not present

## 2015-05-14 DIAGNOSIS — R531 Weakness: Secondary | ICD-10-CM | POA: Diagnosis not present

## 2015-05-14 DIAGNOSIS — Z681 Body mass index (BMI) 19 or less, adult: Secondary | ICD-10-CM | POA: Diagnosis not present

## 2015-05-14 DIAGNOSIS — R079 Chest pain, unspecified: Secondary | ICD-10-CM | POA: Diagnosis present

## 2015-05-14 DIAGNOSIS — I5189 Other ill-defined heart diseases: Secondary | ICD-10-CM | POA: Diagnosis present

## 2015-05-14 DIAGNOSIS — R7989 Other specified abnormal findings of blood chemistry: Secondary | ICD-10-CM | POA: Diagnosis not present

## 2015-05-14 DIAGNOSIS — G9340 Encephalopathy, unspecified: Secondary | ICD-10-CM | POA: Diagnosis not present

## 2015-05-14 DIAGNOSIS — R404 Transient alteration of awareness: Secondary | ICD-10-CM | POA: Diagnosis not present

## 2015-05-14 DIAGNOSIS — Y95 Nosocomial condition: Secondary | ICD-10-CM | POA: Diagnosis not present

## 2015-05-14 DIAGNOSIS — I5041 Acute combined systolic (congestive) and diastolic (congestive) heart failure: Secondary | ICD-10-CM | POA: Diagnosis not present

## 2015-05-14 DIAGNOSIS — E875 Hyperkalemia: Secondary | ICD-10-CM | POA: Diagnosis present

## 2015-05-14 DIAGNOSIS — Z8673 Personal history of transient ischemic attack (TIA), and cerebral infarction without residual deficits: Secondary | ICD-10-CM

## 2015-05-14 DIAGNOSIS — Z515 Encounter for palliative care: Secondary | ICD-10-CM | POA: Diagnosis not present

## 2015-05-14 DIAGNOSIS — IMO0001 Reserved for inherently not codable concepts without codable children: Secondary | ICD-10-CM

## 2015-05-14 DIAGNOSIS — N189 Chronic kidney disease, unspecified: Secondary | ICD-10-CM

## 2015-05-14 DIAGNOSIS — R059 Cough, unspecified: Secondary | ICD-10-CM

## 2015-05-14 DIAGNOSIS — Z7189 Other specified counseling: Secondary | ICD-10-CM | POA: Insufficient documentation

## 2015-05-14 DIAGNOSIS — R778 Other specified abnormalities of plasma proteins: Secondary | ICD-10-CM

## 2015-05-14 DIAGNOSIS — R0602 Shortness of breath: Secondary | ICD-10-CM | POA: Diagnosis not present

## 2015-05-14 DIAGNOSIS — R569 Unspecified convulsions: Secondary | ICD-10-CM | POA: Diagnosis not present

## 2015-05-14 DIAGNOSIS — R262 Difficulty in walking, not elsewhere classified: Secondary | ICD-10-CM | POA: Diagnosis not present

## 2015-05-14 DIAGNOSIS — R000 Tachycardia, unspecified: Secondary | ICD-10-CM | POA: Diagnosis present

## 2015-05-14 DIAGNOSIS — M6281 Muscle weakness (generalized): Secondary | ICD-10-CM | POA: Diagnosis not present

## 2015-05-14 DIAGNOSIS — I1310 Hypertensive heart and chronic kidney disease without heart failure, with stage 1 through stage 4 chronic kidney disease, or unspecified chronic kidney disease: Secondary | ICD-10-CM | POA: Diagnosis present

## 2015-05-14 DIAGNOSIS — I5043 Acute on chronic combined systolic (congestive) and diastolic (congestive) heart failure: Secondary | ICD-10-CM | POA: Diagnosis not present

## 2015-05-14 DIAGNOSIS — G40909 Epilepsy, unspecified, not intractable, without status epilepticus: Secondary | ICD-10-CM | POA: Diagnosis present

## 2015-05-14 DIAGNOSIS — E43 Unspecified severe protein-calorie malnutrition: Secondary | ICD-10-CM | POA: Diagnosis present

## 2015-05-14 DIAGNOSIS — G9341 Metabolic encephalopathy: Secondary | ICD-10-CM | POA: Diagnosis not present

## 2015-05-14 DIAGNOSIS — R809 Proteinuria, unspecified: Secondary | ICD-10-CM | POA: Diagnosis not present

## 2015-05-14 DIAGNOSIS — N289 Disorder of kidney and ureter, unspecified: Secondary | ICD-10-CM | POA: Diagnosis not present

## 2015-05-14 DIAGNOSIS — N183 Chronic kidney disease, stage 3 unspecified: Secondary | ICD-10-CM

## 2015-05-14 DIAGNOSIS — R109 Unspecified abdominal pain: Secondary | ICD-10-CM | POA: Diagnosis not present

## 2015-05-14 DIAGNOSIS — Z79899 Other long term (current) drug therapy: Secondary | ICD-10-CM | POA: Diagnosis not present

## 2015-05-14 DIAGNOSIS — Z7982 Long term (current) use of aspirin: Secondary | ICD-10-CM | POA: Diagnosis not present

## 2015-05-14 DIAGNOSIS — Z9114 Patient's other noncompliance with medication regimen: Secondary | ICD-10-CM | POA: Diagnosis not present

## 2015-05-14 DIAGNOSIS — T502X5A Adverse effect of carbonic-anhydrase inhibitors, benzothiadiazides and other diuretics, initial encounter: Secondary | ICD-10-CM | POA: Diagnosis present

## 2015-05-14 DIAGNOSIS — Z0389 Encounter for observation for other suspected diseases and conditions ruled out: Secondary | ICD-10-CM

## 2015-05-14 DIAGNOSIS — E7220 Disorder of urea cycle metabolism, unspecified: Secondary | ICD-10-CM | POA: Diagnosis not present

## 2015-05-14 DIAGNOSIS — Z5189 Encounter for other specified aftercare: Secondary | ICD-10-CM | POA: Diagnosis not present

## 2015-05-14 DIAGNOSIS — I519 Heart disease, unspecified: Secondary | ICD-10-CM | POA: Diagnosis not present

## 2015-05-14 DIAGNOSIS — I252 Old myocardial infarction: Secondary | ICD-10-CM | POA: Diagnosis not present

## 2015-05-14 DIAGNOSIS — I2510 Atherosclerotic heart disease of native coronary artery without angina pectoris: Secondary | ICD-10-CM | POA: Diagnosis present

## 2015-05-14 DIAGNOSIS — K3189 Other diseases of stomach and duodenum: Secondary | ICD-10-CM | POA: Diagnosis not present

## 2015-05-14 DIAGNOSIS — D649 Anemia, unspecified: Secondary | ICD-10-CM | POA: Diagnosis present

## 2015-05-14 DIAGNOSIS — R05 Cough: Secondary | ICD-10-CM

## 2015-05-14 DIAGNOSIS — I5021 Acute systolic (congestive) heart failure: Secondary | ICD-10-CM | POA: Diagnosis not present

## 2015-05-14 DIAGNOSIS — N179 Acute kidney failure, unspecified: Secondary | ICD-10-CM | POA: Diagnosis not present

## 2015-05-14 DIAGNOSIS — R0600 Dyspnea, unspecified: Secondary | ICD-10-CM | POA: Diagnosis not present

## 2015-05-14 DIAGNOSIS — I428 Other cardiomyopathies: Secondary | ICD-10-CM

## 2015-05-14 HISTORY — DX: Other specified abnormalities of plasma proteins: R77.8

## 2015-05-14 HISTORY — DX: Chest pain, unspecified: R07.9

## 2015-05-14 HISTORY — DX: Other specified abnormal findings of blood chemistry: R79.89

## 2015-05-14 LAB — URINALYSIS, ROUTINE W REFLEX MICROSCOPIC
Glucose, UA: NEGATIVE mg/dL
Hgb urine dipstick: NEGATIVE
Ketones, ur: NEGATIVE mg/dL
Nitrite: NEGATIVE
Protein, ur: >300 mg/dL — AB
Specific Gravity, Urine: 1.024 (ref 1.005–1.030)
pH: 5.5 (ref 5.0–8.0)

## 2015-05-14 LAB — COMPREHENSIVE METABOLIC PANEL
ALT: 74 U/L — ABNORMAL HIGH (ref 14–54)
AST: 85 U/L — ABNORMAL HIGH (ref 15–41)
Albumin: 4.3 g/dL (ref 3.5–5.0)
Alkaline Phosphatase: 158 U/L — ABNORMAL HIGH (ref 38–126)
Anion gap: 12 (ref 5–15)
BUN: 28 mg/dL — ABNORMAL HIGH (ref 6–20)
CO2: 19 mmol/L — ABNORMAL LOW (ref 22–32)
Calcium: 8.9 mg/dL (ref 8.9–10.3)
Chloride: 106 mmol/L (ref 101–111)
Creatinine, Ser: 1.68 mg/dL — ABNORMAL HIGH (ref 0.44–1.00)
GFR calc Af Amer: 34 mL/min — ABNORMAL LOW (ref >60)
GFR calc non Af Amer: 29 mL/min — ABNORMAL LOW (ref >60)
Glucose, Bld: 121 mg/dL — ABNORMAL HIGH (ref 65–99)
Potassium: 5.5 mmol/L — ABNORMAL HIGH (ref 3.5–5.1)
Sodium: 137 mmol/L (ref 135–145)
Total Bilirubin: 1 mg/dL (ref 0.3–1.2)
Total Protein: 7.9 g/dL (ref 6.5–8.1)

## 2015-05-14 LAB — CBC WITH DIFFERENTIAL/PLATELET
Basophils Absolute: 0 10*3/uL (ref 0.0–0.1)
Basophils Relative: 0 %
Eosinophils Absolute: 0 10*3/uL (ref 0.0–0.7)
Eosinophils Relative: 0 %
HCT: 30.7 % — ABNORMAL LOW (ref 36.0–46.0)
Hemoglobin: 10.4 g/dL — ABNORMAL LOW (ref 12.0–15.0)
Lymphocytes Relative: 17 %
Lymphs Abs: 1.6 10*3/uL (ref 0.7–4.0)
MCH: 32.2 pg (ref 26.0–34.0)
MCHC: 33.9 g/dL (ref 30.0–36.0)
MCV: 95 fL (ref 78.0–100.0)
Monocytes Absolute: 0.6 10*3/uL (ref 0.1–1.0)
Monocytes Relative: 6 %
Neutro Abs: 7.4 10*3/uL (ref 1.7–7.7)
Neutrophils Relative %: 77 %
Platelets: 350 10*3/uL (ref 150–400)
RBC: 3.23 MIL/uL — ABNORMAL LOW (ref 3.87–5.11)
RDW: 18.2 % — ABNORMAL HIGH (ref 11.5–15.5)
WBC: 9.6 10*3/uL (ref 4.0–10.5)

## 2015-05-14 LAB — MAGNESIUM: Magnesium: 2.2 mg/dL (ref 1.7–2.4)

## 2015-05-14 LAB — TROPONIN I
Troponin I: 0.08 ng/mL — ABNORMAL HIGH (ref <0.031)
Troponin I: 0.08 ng/mL — ABNORMAL HIGH (ref <0.031)
Troponin I: 0.08 ng/mL — ABNORMAL HIGH (ref <0.031)

## 2015-05-14 LAB — URINE MICROSCOPIC-ADD ON: RBC / HPF: NONE SEEN RBC/hpf (ref 0–5)

## 2015-05-14 LAB — CBG MONITORING, ED: Glucose-Capillary: 116 mg/dL — ABNORMAL HIGH (ref 65–99)

## 2015-05-14 LAB — BRAIN NATRIURETIC PEPTIDE: B Natriuretic Peptide: >4500 pg/mL — ABNORMAL HIGH (ref 0.0–100.0)

## 2015-05-14 MED ORDER — LEVETIRACETAM 500 MG PO TABS
500.0000 mg | ORAL_TABLET | Freq: Every day | ORAL | Status: DC
Start: 2015-05-14 — End: 2015-05-23
  Administered 2015-05-14 – 2015-05-22 (×9): 500 mg via ORAL
  Filled 2015-05-14 (×9): qty 1

## 2015-05-14 MED ORDER — ENSURE ENLIVE PO LIQD
237.0000 mL | Freq: Two times a day (BID) | ORAL | Status: DC
  Administered 2015-05-14 – 2015-05-21 (×7): 237 mL via ORAL

## 2015-05-14 MED ORDER — POLYETHYLENE GLYCOL 3350 17 G PO PACK
17.0000 g | PACK | Freq: Every day | ORAL | Status: DC | PRN
  Administered 2015-05-21: 17 g via ORAL
  Filled 2015-05-14: qty 1

## 2015-05-14 MED ORDER — ALBUTEROL SULFATE (2.5 MG/3ML) 0.083% IN NEBU
5.0000 mg | INHALATION_SOLUTION | Freq: Once | RESPIRATORY_TRACT | Status: AC
  Administered 2015-05-14: 5 mg via RESPIRATORY_TRACT
  Filled 2015-05-14: qty 6

## 2015-05-14 MED ORDER — ACETAMINOPHEN 650 MG RE SUPP: 650.0000 mg | Freq: Four times a day (QID) | RECTAL | Status: DC | PRN

## 2015-05-14 MED ORDER — AZITHROMYCIN 250 MG PO TABS
500.0000 mg | ORAL_TABLET | Freq: Once | ORAL | Status: AC
  Administered 2015-05-14: 500 mg via ORAL
  Filled 2015-05-14: qty 2

## 2015-05-14 MED ORDER — SODIUM CHLORIDE 0.9% FLUSH
3.0000 mL | Freq: Two times a day (BID) | INTRAVENOUS | Status: DC
  Administered 2015-05-14 – 2015-05-18 (×3): 3 mL via INTRAVENOUS

## 2015-05-14 MED ORDER — ONDANSETRON HCL 4 MG/2ML IJ SOLN
4.0000 mg | Freq: Once | INTRAMUSCULAR | Status: AC
  Administered 2015-05-14: 4 mg via INTRAVENOUS
  Filled 2015-05-14: qty 2

## 2015-05-14 MED ORDER — CYCLOBENZAPRINE HCL 5 MG PO TABS
5.0000 mg | ORAL_TABLET | Freq: Every day | ORAL | Status: DC
  Administered 2015-05-14 – 2015-05-22 (×7): 5 mg via ORAL
  Filled 2015-05-14 (×8): qty 1

## 2015-05-14 MED ORDER — ACETAMINOPHEN 325 MG PO TABS
650.0000 mg | ORAL_TABLET | Freq: Four times a day (QID) | ORAL | Status: DC | PRN
  Administered 2015-05-16 – 2015-05-17 (×2): 650 mg via ORAL
  Filled 2015-05-14 (×2): qty 2

## 2015-05-14 MED ORDER — FUROSEMIDE 10 MG/ML IJ SOLN
40.0000 mg | Freq: Once | INTRAMUSCULAR | Status: AC
  Administered 2015-05-14: 40 mg via INTRAVENOUS
  Filled 2015-05-14: qty 4

## 2015-05-14 MED ORDER — ASPIRIN 325 MG PO TABS
325.0000 mg | ORAL_TABLET | Freq: Every morning | ORAL | Status: DC
  Administered 2015-05-14 – 2015-05-19 (×6): 325 mg via ORAL
  Filled 2015-05-14 (×6): qty 1

## 2015-05-14 MED ORDER — SODIUM CHLORIDE 0.9% FLUSH: 3.0000 mL | INTRAVENOUS | Status: DC | PRN

## 2015-05-14 MED ORDER — SODIUM CHLORIDE 0.9% FLUSH
3.0000 mL | Freq: Two times a day (BID) | INTRAVENOUS | Status: DC
  Administered 2015-05-14 – 2015-05-18 (×7): 3 mL via INTRAVENOUS

## 2015-05-14 MED ORDER — SODIUM CHLORIDE 0.9 % IV SOLN: 250.0000 mL | INTRAVENOUS | Status: DC | PRN

## 2015-05-14 MED ORDER — LACOSAMIDE 50 MG PO TABS
150.0000 mg | ORAL_TABLET | Freq: Two times a day (BID) | ORAL | Status: DC
Start: 2015-05-14 — End: 2015-05-23
  Administered 2015-05-14 – 2015-05-23 (×19): 150 mg via ORAL
  Filled 2015-05-14 (×19): qty 3

## 2015-05-14 MED ORDER — ALLOPURINOL 100 MG PO TABS
200.0000 mg | ORAL_TABLET | Freq: Every day | ORAL | Status: DC
  Administered 2015-05-14 – 2015-05-23 (×10): 200 mg via ORAL
  Filled 2015-05-14 (×11): qty 2

## 2015-05-14 MED ORDER — CARVEDILOL 6.25 MG PO TABS
6.2500 mg | ORAL_TABLET | Freq: Two times a day (BID) | ORAL | Status: DC
  Administered 2015-05-14 – 2015-05-17 (×7): 6.25 mg via ORAL
  Filled 2015-05-14 (×7): qty 1

## 2015-05-14 MED ORDER — ONDANSETRON HCL 4 MG PO TABS: 4.0000 mg | ORAL_TABLET | Freq: Four times a day (QID) | ORAL | Status: DC

## 2015-05-14 MED ORDER — DEXTROSE 5 % IV SOLN
1.0000 g | Freq: Once | INTRAVENOUS | Status: AC
  Administered 2015-05-14: 1 g via INTRAVENOUS
  Filled 2015-05-14: qty 10

## 2015-05-14 MED ORDER — COLCHICINE 0.6 MG PO TABS
0.6000 mg | ORAL_TABLET | Freq: Every day | ORAL | Status: DC
  Administered 2015-05-14 – 2015-05-15 (×2): 0.6 mg via ORAL
  Filled 2015-05-14 (×2): qty 1

## 2015-05-14 MED ORDER — CALCITRIOL 0.25 MCG PO CAPS
0.2500 ug | ORAL_CAPSULE | Freq: Every day | ORAL | Status: DC
  Administered 2015-05-14 – 2015-05-23 (×10): 0.25 ug via ORAL
  Filled 2015-05-14 (×11): qty 1

## 2015-05-14 MED ORDER — AZITHROMYCIN 250 MG PO TABS: 250.0000 mg | ORAL_TABLET | Freq: Every day | ORAL | Status: DC

## 2015-05-14 MED ORDER — FUROSEMIDE 10 MG/ML IJ SOLN
40.0000 mg | Freq: Two times a day (BID) | INTRAMUSCULAR | Status: DC
  Administered 2015-05-14: 40 mg via INTRAVENOUS
  Filled 2015-05-14: qty 4

## 2015-05-14 MED ORDER — HEPARIN SODIUM (PORCINE) 5000 UNIT/ML IJ SOLN
5000.0000 [IU] | Freq: Three times a day (TID) | INTRAMUSCULAR | Status: DC
  Administered 2015-05-14 – 2015-05-23 (×28): 5000 [IU] via SUBCUTANEOUS
  Filled 2015-05-14 (×28): qty 1

## 2015-05-14 NOTE — H&P (Signed)
[facility] History and Physical  [name] WUJ:[id number] DOB: [birth year] DOA: 05/14/2015  Referring physician: Dr. Blinda Leatherwood PCP: [name], MD   Chief Complaint: weakness and CP  HPI: [name] is a 75 y.o. female past medical history of CVA, seizure an MI as per patient with ischemic cardiomyopathy with an EF of 35% by echo on 12/29/2012 that comes in for generalized weakness and chest pain that started 2 weeks prior to admission, accompanied this she was also short of breath with exertion with a dry cough. Tonight she started having nausea so she decided to come to the ED. She relates her pain is better now, is not pleuritic she still gets short of breath with exertion and continues to have a dry cough, she has not been taking her Lasix. He denies any fever, nausea, vomiting, diarrhea. She denies any sick contacts ongoing tobacco smoking.  In the ED: Cardiac biomarkers were done which were positive she was mildly hyperkalemic with new acute renal failure and a mild elevation in her alkaline phosphatase and AST ALT.   Review of Systems:  Constitutional:  No weight loss, night sweats, Fevers, chills, fatigue.  HEENT:  No headaches, Difficulty swallowing,Tooth/dental problems,Sore throat,  No sneezing, itching, ear ache, nasal congestion, post nasal drip,  Cardio-vascular:  No chest pain, Orthopnea, PND, swelling in lower extremities, anasarca, dizziness, palpitations  GI:  No heartburn, indigestion, abdominal pain, nausea, vomiting, diarrhea, change in bowel habits, loss of appetite  Resp:   No excess mucus, no productive cough, , No coughing up of blood.No change in color of mucus.No wheezing.No chest wall deformity  Skin:  no rash or lesions.  GU:  no dysuria, change in color of urine, no urgency or frequency. No flank pain.  Musculoskeletal:  No joint pain or swelling. No decreased range of motion. No back pain.  Psych:  No change in mood or affect.  No depression or anxiety. No memory loss.   Past Medical History  Diagnosis Date  . MI (myocardial infarction) (HCC)   . Hypertension   . Stroke (HCC)   . Congestive heart failure (CHF) (HCC)     EF 35% Nov 2014  . Epilepsy (HCC)   . Smoker    Past Surgical History  Procedure Laterality Date  . No past surgeries     Social History:  reports that she has been smoking Cigarettes.  She has been smoking about 0.10 packs per day. She has never used smokeless tobacco. She reports that she drinks alcohol. She reports that she does not use illicit drugs.  No Known Allergies  Family History  Problem Relation Age of Onset  . Cancer Father   . Anemia Daughter     Prior to Admission medications   Medication Sig Start Date End Date Taking? Authorizing Provider  allopurinol (ZYLOPRIM) 100 MG tablet Take 200 mg by mouth daily.  07/17/14  Yes Historical Provider, MD  aspirin 325 MG tablet Take 325 mg by mouth every morning.    Yes Historical Provider, MD  calcitRIOL (ROCALTROL) 0.25 MCG capsule Take 0.25 mcg by mouth daily.   Yes Historical Provider, MD  carvedilol (COREG) 6.25 MG tablet Take 1 tablet (6.25 mg total) by mouth 2 (two) times daily with a meal. 03/09/14  Yes Ripudeep K Rai, MD  colchicine (COLCRYS) 0.6 MG tablet Take 0.6 mg by mouth daily.   Yes Historical Provider, MD  cyclobenzaprine (FLEXERIL) 5 MG tablet Take 5 mg by mouth at bedtime.   Yes Historical  Provider, MD  levETIRAcetam (KEPPRA) 500 MG tablet Take 1 tablet (500 mg total) by mouth at bedtime. 08/21/14  Yes Butch Penny, NP  VIMPAT 100 MG TABS Take 1.5 tablets (150 mg total) by mouth 2 (two) times daily. 02/24/15  Yes Butch Penny, NP  azithromycin (ZITHROMAX Z-PAK) 250 MG tablet Take 1 tablet (250 mg total) by mouth daily. 05/14/15   Elpidio Anis, PA-C  furosemide (LASIX) 20 MG tablet Take 2 tablets (40 mg total) by mouth daily. Patient not taking: Reported on 05/14/2015 03/10/14   Ripudeep Jenna Luo, MD  ondansetron (ZOFRAN)  4 MG tablet Take 1 tablet (4 mg total) by mouth every 6 (six) hours. 05/14/15   Elpidio Anis, PA-C   Physical Exam: Filed Vitals:   05/14/15 0208 05/14/15 0434 05/14/15 0609 05/14/15 0630  BP: 134/97 121/89 122/95 99/79  Pulse: 102 106 106 102  Temp: 97.4 F (36.3 C)     TempSrc: Oral     Resp: 16 18  20   SpO2: 100% 100% 92% 92%    Wt Readings from Last 3 Encounters:  12/22/14 37.875 kg (83 lb 8 oz)  09/28/14 37.195 kg (82 lb)  08/18/14 37.195 kg (82 lb)    General:  Appears calm and comfortable Eyes: PERRL, normal lids, irises & conjunctiva ENT: grossly normal hearing, lips & tongue Neck: Trachea is midline JVD to her earlobe Cardiovascular: RRR, with a systolic murmur no lower shotty edema. Telemetry: SR, no arrhythmias  Respiratory: She has good air movement with fine crackles on the right lung. Abdomen: soft, ntnd Skin: no rash or induration seen on limited exam Musculoskeletal: grossly normal tone BUE/BLE Psychiatric: grossly normal mood and affect, speech fluent and appropriate Neurologic: grossly non-focal.          Labs on Admission:  Basic Metabolic Panel:  Recent Labs Lab 05/14/15 0356  NA 137  K 5.5*  CL 106  CO2 19*  GLUCOSE 121*  BUN 28*  CREATININE 1.68*  CALCIUM 8.9   Liver Function Tests:  Recent Labs Lab 05/14/15 0356  AST 85*  ALT 74*  ALKPHOS 158*  BILITOT 1.0  PROT 7.9  ALBUMIN 4.3   No results for input(s): LIPASE, AMYLASE in the last 168 hours. No results for input(s): AMMONIA in the last 168 hours. CBC:  Recent Labs Lab 05/14/15 0356  WBC 9.6  NEUTROABS 7.4  HGB 10.4*  HCT 30.7*  MCV 95.0  PLT 350   Cardiac Enzymes:  Recent Labs Lab 05/14/15 0609  TROPONINI 0.08*    BNP (last 3 results) No results for input(s): BNP in the last 8760 hours.  ProBNP (last 3 results) No results for input(s): PROBNP in the last 8760 hours.  CBG:  Recent Labs Lab 05/14/15 0210  GLUCAP 116*    Radiological Exams on  Admission: Dg Chest 2 View  05/12/2015  CLINICAL DATA:  Low back pain.  Posterior chest pain. EXAM: CHEST  2 VIEW COMPARISON:  03/08/2014 FINDINGS: Cardiac enlargement is similar to the prior study. Negative for heart failure. Small bilateral pleural effusions. Mild streaky density in both lung bases consistent with atelectasis or infiltrate. No mass lesion. Nipple shadow on the left noted Severe compression fracture mid thoracic spine of indeterminate age. Most recent lateral view was 02/25/2013 and the fracture was not present at that time IMPRESSION: Cardiac enlargement with small pleural effusions. Negative for heart failure Mild bibasilar atelectasis/infiltrate. Severe mid thoracic compression fracture indeterminate age. Electronically Signed   By: Marlan Palau M.D.  On: 05/12/2015 15:41   Dg Lumbar Spine 2-3 Views  05/12/2015  CLINICAL DATA:  Low back pain EXAM: LUMBAR SPINE - 2-3 VIEW COMPARISON:  02/19/2013 FINDINGS: Moderate compression fracture L2 is chronic and unchanged. Mild to moderate levoscoliosis unchanged. No new fracture. L5 is partially sacralized. Generalized osteopenia. No significant disc degeneration. 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 No acute abnormality. Electronically Signed   By: Marlan Palau M.D.   On: 05/12/2015 15:32    EKG: Independently reviewed. Sinus rhythm Q waves in inferior leads extreme axis deviation nonspecific T-wave changes and probable right ventricular hypertrophy.  Assessment/Plan Acute combined systolic and diastolic congestive heart failure (HCC) - She is complaining of shortness of breath and weakness for the last several weeks, she has elevated JVD with a history of ischemic cardiomyopathy with an EF of 35%, admitted to telemetry cycle cardiac enzymes repeat a 2-D echo.  - Restrict her diet started on IV Lasix continue her Coreg. - Monitor strict I's and O's, and continue cycle her cardiac biomarkers as there are mildly  elevated.  Troponin level elevated/Chest pain: - Mild elevation in her cardiac enzymes with complains of chest pain for 2 weeks, in the setting of chronic renal disease with acute decompensated heart failure. - We'll admit and cycle her cardiac enzymes repeat a 2-D echo. - Her cardiac biomarkers might be slightly elevated due to her chronic renal disease and the setting of acute decompensated heart failure. - Continue aspirin.  Hypertensive heart and kidney disease - I will continue her beta blocker and change her Lasix to IV, her blood pressure seems to be controlled.  Hyperkalemia - In the setting of new acute renal failure only mild her EKG doesn't show any changes for hyperkalemia, hopefully IV Lasix will bring it down. Will repeat in the morning.  Protein-calorie malnutrition, severe (HCC) - She is cachectic appearing, get a nutrition consult started on short 3 times a day.    AKI (acute kidney injury) (HCC) on chronic renal disease stage III - With a baseline creatinine of 1.2-1.4, question if this is likely due to hemorrhagic acute decompensated heart failure, will start to diurese her and repeat a basic metabolic panel in the morning.  Seizure disorder: - No changes were made to her medication.  Code Status: full DVT Prophylaxis:heparin Family Communication: none Disposition Plan: inpatient  Time spent: 75 min  Marinda Elk [facility] Pager [phone]

## 2015-05-14 NOTE — ED Notes (Addendum)
Pt BIB EMS from home c/o nausea, generalized weakness, diaphoresis and vertigo. Negative orthostatics with EMS. Denies pain and diarrhea.   EMS vitals: 154/104, HR 106, 98%

## 2015-05-14 NOTE — Discharge Instructions (Signed)

## 2015-05-14 NOTE — ED Provider Notes (Signed)
CSN: [id number]     Arrival date & time 05/14/15  0208 History   First MD Initiated Contact with Patient 05/14/15 0216     Chief Complaint  Patient presents with  . Nausea  . Weakness     (Consider location/radiation/quality/duration/timing/severity/associated sxs/prior Treatment) HPI Comments: Patient with a history of HTN, stroke, CHF, seizures, MI presents with complaint of feeling weak and SOB with cough for several days. Tonight she started having nausea, prompting emergency department evaluation. She was seen by her doctor yesterday who ordered a chest x-ray for evaluation of cough and SOB. The patient does not know the result. She denies vomiting or diarrhea. No urinary symptoms. She denies fever  Patient is a 75 y.o. female presenting with weakness. The history is provided by the patient and a relative. No language interpreter was used.  Weakness This is a new problem. Associated symptoms include coughing, nausea and weakness. Pertinent negatives include no abdominal pain, chest pain, chills, congestion, fever, myalgias, rash or vomiting.    Past Medical History  Diagnosis Date  . MI (myocardial infarction) (HCC)   . Hypertension   . Stroke (HCC)   . Congestive heart failure (CHF) (HCC)     EF 35% Nov 2014  . Epilepsy (HCC)   . Smoker    Past Surgical History  Procedure Laterality Date  . No past surgeries     Family History  Problem Relation Age of Onset  . Cancer Father   . Anemia Daughter    Social History  Substance Use Topics  . Smoking status: Current Some Day Smoker -- 0.10 packs/day    Types: Cigarettes  . Smokeless tobacco: Never Used  . Alcohol Use: 0.0 oz/week    0 Standard drinks or equivalent per week     Comment: occasional   OB History    No data available     Review of Systems  Constitutional: Negative for fever and chills.  HENT: Negative.  Negative for congestion.   Respiratory: Positive for cough and shortness of breath.     Cardiovascular: Negative.  Negative for chest pain.  Gastrointestinal: Positive for nausea. Negative for vomiting, abdominal pain and diarrhea.  Genitourinary: Negative.  Negative for dysuria and frequency.  Musculoskeletal: Positive for back pain. Negative for myalgias and neck stiffness.  Skin: Negative.  Negative for color change and rash.  Neurological: Positive for weakness.      Allergies  Review of patient's allergies indicates no known allergies.  Home Medications   Prior to Admission medications   Medication Sig Start Date End Date Taking? Authorizing Provider  allopurinol (ZYLOPRIM) 100 MG tablet Take 200 mg by mouth daily.  07/17/14  Yes Historical Provider, MD  aspirin 325 MG tablet Take 325 mg by mouth every morning.    Yes Historical Provider, MD  calcitRIOL (ROCALTROL) 0.25 MCG capsule Take 0.25 mcg by mouth daily.   Yes Historical Provider, MD  carvedilol (COREG) 6.25 MG tablet Take 1 tablet (6.25 mg total) by mouth 2 (two) times daily with a meal. 03/09/14  Yes Ripudeep K Rai, MD  colchicine (COLCRYS) 0.6 MG tablet Take 0.6 mg by mouth daily.   Yes Historical Provider, MD  cyclobenzaprine (FLEXERIL) 5 MG tablet Take 5 mg by mouth at bedtime.   Yes Historical Provider, MD  levETIRAcetam (KEPPRA) 500 MG tablet Take 1 tablet (500 mg total) by mouth at bedtime. 08/21/14  Yes Butch Penny, NP  VIMPAT 100 MG TABS Take 1.5 tablets (150 mg total) by mouth  2 (two) times daily. 02/24/15  Yes Butch Penny, NP  furosemide (LASIX) 20 MG tablet Take 2 tablets (40 mg total) by mouth daily. Patient not taking: Reported on 05/14/2015 03/10/14   Ripudeep K Rai, MD   BP 121/89 mmHg  Pulse 106  Temp(Src) 97.4 F (36.3 C) (Oral)  Resp 18  SpO2 100% Physical Exam  Constitutional: She is oriented to person, place, and time. She appears well-developed and well-nourished. No distress.  HENT:  Head: Normocephalic.  Eyes: Conjunctivae are normal.  Neck: Normal range of motion. Neck supple.   Cardiovascular: Tachycardia present.   Pulmonary/Chest: Effort normal. She has no wheezes. She has no rales.   She exhibits no tenderness.  Abdominal: Soft. She exhibits no mass. There is no tenderness.  Musculoskeletal: Normal range of motion. She exhibits no edema.  Tender to left lateral lower chest extending  Neurological: She is alert and oriented to person, place, and time.  Skin: Skin is warm and dry. No rash noted. She is not diaphoretic.  Psychiatric: She has a normal mood and affect.    ED Course  Procedures (including critical care time) Labs Review Labs Reviewed  CBC WITH DIFFERENTIAL/PLATELET - Abnormal; Notable for the following:    RBC 3.23 (*)    Hemoglobin 10.4 (*)    HCT 30.7 (*)    RDW 18.2 (*)    All other components within normal limits  COMPREHENSIVE METABOLIC PANEL - Abnormal; Notable for the following:    Potassium 5.5 (*)    CO2 19 (*)    Glucose, Bld 121 (*)    BUN 28 (*)    Creatinine, Ser 1.68 (*)    AST 85 (*)    ALT 74 (*)    Alkaline Phosphatase 158 (*)    GFR calc non Af Amer 29 (*)    GFR calc Af Amer 34 (*)    All other components within normal limits  CBG MONITORING, ED - Abnormal; Notable for the following:    Glucose-Capillary 116 (*)    All other components within normal limits  URINE CULTURE  URINALYSIS, ROUTINE W REFLEX MICROSCOPIC (NOT AT [facility])    Imaging Review Dg Chest 2 View  05/12/2015  CLINICAL DATA:  Low back pain.  Posterior chest pain. EXAM: CHEST  2 VIEW COMPARISON:  03/08/2014 FINDINGS: Cardiac enlargement is similar to the prior study. Negative for heart failure. Small bilateral pleural effusions. Mild streaky density in both lung bases consistent with atelectasis or infiltrate. No mass lesion. Nipple shadow on the left noted Severe compression fracture mid thoracic spine of indeterminate age. Most recent lateral view was 02/25/2013 and the fracture was not present at that time IMPRESSION: Cardiac enlargement with small  pleural effusions. Negative for heart failure Mild bibasilar atelectasis/infiltrate. Severe mid thoracic compression fracture indeterminate age. Electronically Signed   By: Marlan Palau M.D.   On: 05/12/2015 15:41   Dg Lumbar Spine 2-3 Views  05/12/2015  CLINICAL DATA:  Low back pain EXAM: LUMBAR SPINE - 2-3 VIEW COMPARISON:  02/19/2013 FINDINGS: Moderate compression fracture L2 is chronic and unchanged. Mild to moderate levoscoliosis unchanged. No new fracture. L5 is partially sacralized. Generalized osteopenia. No significant disc degeneration. 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 No acute abnormality. Electronically Signed   By: Marlan Palau M.D.   On: 05/12/2015 15:32   I have personally reviewed and evaluated these images and lab results as part of my medical decision-making.   EKG Interpretation None  MDM   Final diagnoses:  None    1. CAP 2. Elevated troponin  The patient presented to ED with complaint of progressive cough, chills, diaphoresis and nausea over the last one week. Seen by PCP yesterday with outpatient CXR that is reviewed and found to show bibasilar infiltrates. She is non-toxic in appearance, no hypoxia, no confusion or mental status changes. Community acquired pneumonia antibiotics initiated. Troponin pending as she has significant risk factors including advanced age, abnormal CXR,  Tachycardia. Patient care signed out to Dr. Blinda Leatherwood for final disposition at end of shift.     Elpidio Anis, PA-C 05/14/15 2117  Gilda Crease, MD 05/27/15 [phone]

## 2015-05-14 NOTE — ED Provider Notes (Addendum)
Patient presented to the ER with weakness and nausea. She reports that she had an episode of chest discomfort associated with shortness of breath, nausea and diaphoresis last night. Chest pain has resolved. She has had cough for 2 or 3 days. She had an x-ray performed at outpatient imaging one day ago that showed evidence of basilar infiltrate.  Face to face Exam: HEENT - PERRLA Lungs - CTAB Heart - RRR, no M/R/G Abd - S/NT/ND Neuro - alert, oriented x3  Plan: Patient initiated on community-acquired pneumonia antibiotics for chest x-ray findings. She does have a history of cardiac disease. Troponin is slightly elevated. EKG does not show any acute change. Patient will require hospitalization for further evaluation.  Gilda Crease, MD 05/14/15 3086  Gilda Crease, MD 05/14/15 [phone]

## 2015-05-14 NOTE — Progress Notes (Signed)
Patient voided of urine. Bladder scan revealed 65mL of urine in bladder. Do not see indication for foley catheter at this time.  Earnest Conroy. Clelia Croft, RN

## 2015-05-14 NOTE — ED Notes (Signed)
Bed: LF81 Expected date:  Expected time:  Means of arrival:  Comments: EMS 75 yo female nausea, light headed, diaphoretic, ST 110 150/100 stroke screen neg

## 2015-05-14 NOTE — ED Notes (Signed)
MD at bedside. 

## 2015-05-15 ENCOUNTER — Inpatient Hospital Stay (HOSPITAL_COMMUNITY): Payer: Commercial Managed Care - HMO

## 2015-05-15 DIAGNOSIS — N289 Disorder of kidney and ureter, unspecified: Secondary | ICD-10-CM

## 2015-05-15 DIAGNOSIS — I5043 Acute on chronic combined systolic (congestive) and diastolic (congestive) heart failure: Secondary | ICD-10-CM

## 2015-05-15 DIAGNOSIS — N189 Chronic kidney disease, unspecified: Secondary | ICD-10-CM

## 2015-05-15 DIAGNOSIS — R0600 Dyspnea, unspecified: Secondary | ICD-10-CM

## 2015-05-15 HISTORY — DX: Chronic kidney disease, unspecified: N18.9

## 2015-05-15 LAB — BASIC METABOLIC PANEL
Anion gap: 15 (ref 5–15)
BUN: 53 mg/dL — ABNORMAL HIGH (ref 6–20)
CO2: 18 mmol/L — ABNORMAL LOW (ref 22–32)
Calcium: 8.8 mg/dL — ABNORMAL LOW (ref 8.9–10.3)
Chloride: 105 mmol/L (ref 101–111)
Creatinine, Ser: 3.17 mg/dL — ABNORMAL HIGH (ref 0.44–1.00)
GFR calc Af Amer: 16 mL/min — ABNORMAL LOW (ref >60)
GFR calc non Af Amer: 14 mL/min — ABNORMAL LOW (ref >60)
Glucose, Bld: 110 mg/dL — ABNORMAL HIGH (ref 65–99)
Potassium: 5.1 mmol/L (ref 3.5–5.1)
Sodium: 138 mmol/L (ref 135–145)

## 2015-05-15 LAB — ECHOCARDIOGRAM COMPLETE
Height: 62 in
Weight: 1389.78 oz

## 2015-05-15 LAB — URINE CULTURE

## 2015-05-15 LAB — TROPONIN I: Troponin I: 0.08 ng/mL — ABNORMAL HIGH (ref <0.031)

## 2015-05-15 NOTE — Progress Notes (Signed)
*  PRELIMINARY RESULTS* Echocardiogram 2D Echocardiogram has been performed.  [name] 05/15/2015, 3:21 PM

## 2015-05-15 NOTE — Progress Notes (Signed)
Resident continues on strict I&O. Had only 150cc of urine this shift. Bladder scan shows  105cc of urine.

## 2015-05-15 NOTE — Progress Notes (Signed)
PROGRESS NOTE  [name] IOM:[id number] DOB: 1941-01-06 DOA: 05/14/2015 PCP: [name], MD Brief History 75 year old female who presents with 1-2 week history of shortness of breath and dyspnea on exertion with associated dizziness. The patient states that in the past 3 days she has had a worsening of her symptoms with intermittent chest discomfort. She denies any fevers, chills, nausea, vomiting, diarrhea, syncope, mechanical falls. She states that her primary care physician recently contacted her and told her to stop her furosemide. She has been off of for furosemide for slightly over one week prior to this admission. Initial workup revealed that the patient had small bilateral pleural effusions and decompensated CHF. The patient was started on intravenous furosemide with good clinical improvement.  Assessment/Plan: Acute on chronic systolic and diastolic CHF -good clinical improvement with IV lasix -hold furosemide today due to increased Scr and monitor clinically-->3.17 -she appears more euvolemic today -Echocardiogram -Daily weights -Continue carvedilol -Unable to use ACE inhibitor secondary to renal insufficiency  Elevated troponin -Likely due to decompensated CHF -No chest pain presently -Trend is flat  Acute on chronic renal failure--CKD stage III -Baseline creatinine 1.2-1.6 -Due to diuresis -dc colchicine for now  Severe protein calorie malnutrition -Continue measures for supplementation  Hyperkalemia -Improved with diuresis  Seizure disorder -Continue Vimpat and Keppra  History of stroke -Continue aspirin   Family Communication:   Daughter updated at beside--total time 35 min >50% spent counseling and coordinating care Disposition Plan:   Home 4/15 or 4/16       Procedures/Studies: Dg Chest 2 View  05/12/2015  CLINICAL DATA:  Low back pain.  Posterior chest pain. EXAM: CHEST  2 VIEW COMPARISON:  03/08/2014 FINDINGS: Cardiac  enlargement is similar to the prior study. Negative for heart failure. Small bilateral pleural effusions. Mild streaky density in both lung bases consistent with atelectasis or infiltrate. No mass lesion. Nipple shadow on the left noted Severe compression fracture mid thoracic spine of indeterminate age. Most recent lateral view was 02/25/2013 and the fracture was not present at that time IMPRESSION: Cardiac enlargement with small pleural effusions. Negative for heart failure Mild bibasilar atelectasis/infiltrate. Severe mid thoracic compression fracture indeterminate age. Electronically Signed   By: Marlan Palau M.D.   On: 05/12/2015 15:41   Dg Lumbar Spine 2-3 Views  05/12/2015  CLINICAL DATA:  Low back pain EXAM: LUMBAR SPINE - 2-3 VIEW COMPARISON:  02/19/2013 FINDINGS: Moderate compression fracture L2 is chronic and unchanged. Mild to moderate levoscoliosis unchanged. No new fracture. L5 is partially sacralized. Generalized osteopenia. No significant disc degeneration. 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 No acute abnormality. Electronically Signed   By: Marlan Palau M.D.   On: 05/12/2015 15:32         Subjective: Patient is breathing better today. Denies any fevers, chills, chest pain, nausea, vomiting, diarrhea, abdominal pain. No dysuria or hematuria. No rashes.  Objective: Filed Vitals:   05/14/15 2058 05/15/15 0454 05/15/15 0822 05/15/15 1400  BP: 91/67 120/80 103/79 91/68  Pulse: 99 99 99 86  Temp: 98.1 F (36.7 C) 98.9 F (37.2 C)  98.4 F (36.9 C)  TempSrc: Oral Oral  Oral  Resp: 18 18 20 20   Height:      Weight:  39.4 kg (86 lb 13.8 oz)    SpO2: 98% 94% 99% 95%    Intake/Output Summary (Last 24 hours) at 05/15/15 1446 Last data filed at 05/15/15 0104  Gross per 24  hour  Intake     60 ml  Output    350 ml  Net   -290 ml   Weight change:  Exam:   General:  Pt is alert, follows commands appropriately, not in acute distress  HEENT: No  icterus, No thrush, No neck mass, [place]/AT  Cardiovascular: RRR, S1/S2, no rubs, no gallops  Respiratory: Fine bibasilar crackles. No wheezes. Good air movement   Abdomen: Soft/+BS, non tender, non distended, no guarding  Extremities: No edema, No lymphangitis, No petechiae, No rashes, no synovitis  Data Reviewed: Basic Metabolic Panel:  Recent Labs Lab 05/14/15 0356 05/14/15 1151 05/15/15 0501  NA 137  --  138  K 5.5*  --  5.1  CL 106  --  105  CO2 19*  --  18*  GLUCOSE 121*  --  110*  BUN 28*  --  53*  CREATININE 1.68*  --  3.17*  CALCIUM 8.9  --  8.8*  MG  --  2.2  --    Liver Function Tests:  Recent Labs Lab 05/14/15 0356  AST 85*  ALT 74*  ALKPHOS 158*  BILITOT 1.0  PROT 7.9  ALBUMIN 4.3   No results for input(s): LIPASE, AMYLASE in the last 168 hours. No results for input(s): AMMONIA in the last 168 hours. CBC:  Recent Labs Lab 05/14/15 0356  WBC 9.6  NEUTROABS 7.4  HGB 10.4*  HCT 30.7*  MCV 95.0  PLT 350   Cardiac Enzymes:  Recent Labs Lab 05/14/15 0609 05/14/15 1151 05/14/15 1745 05/14/15 2337  TROPONINI 0.08* 0.08* 0.08* 0.08*   BNP: Invalid input(s): POCBNP CBG:  Recent Labs Lab 05/14/15 0210  GLUCAP 116*    Recent Results (from the past 240 hour(s))  Urine culture     Status: None (Preliminary result)   Collection Time: 05/14/15  3:26 AM  Result Value Ref Range Status   Specimen Description URINE, CLEAN CATCH  Final   Special Requests NONE  Final   Culture   Final    CULTURE REINCUBATED FOR BETTER GROWTH Performed at [facility]    Report Status PENDING  Incomplete     Scheduled Meds: . allopurinol  200 mg Oral Daily  . aspirin  325 mg Oral q morning - 10a  . calcitRIOL  0.25 mcg Oral Daily  . carvedilol  6.25 mg Oral BID WC  . colchicine  0.6 mg Oral Daily  . cyclobenzaprine  5 mg Oral QHS  . feeding supplement (ENSURE ENLIVE)  237 mL Oral BID BM  . heparin  5,000 Units Subcutaneous 3 times per day  .  lacosamide  150 mg Oral BID  . levETIRAcetam  500 mg Oral QHS  . sodium chloride flush  3 mL Intravenous Q12H  . sodium chloride flush  3 mL Intravenous Q12H   Continuous Infusions:    Yasmine Kilbourne, DO  [facility] Pager [phone]  If 7PM-7AM, please contact night-coverage [email or web address] Password [email or web address] 05/15/2015, 2:46 PM   LOS: 1 day

## 2015-05-15 NOTE — Progress Notes (Signed)
Initial Nutrition Assessment  DOCUMENTATION CODES:   Underweight  INTERVENTION:  - Continue Ensure Enlive po BID, each supplement provides 350 kcal and 20 grams of protein - RD will continue to monitor for additional needs  NUTRITION DIAGNOSIS:   Increased nutrient needs related to chronic illness as evidenced by estimated needs.  GOAL:   Patient will meet greater than or equal to 90% of their needs, Weight gain  MONITOR:   PO intake, Supplement acceptance, Weight trends, Labs, I & O's  REASON FOR ASSESSMENT:   Malnutrition Screening Tool  ASSESSMENT:   75 y.o. female past medical history of CVA, seizure an MI as per patient with ischemic cardiomyopathy with an EF of 35% by echo on 12/29/2012 that comes in for generalized weakness and chest pain that started 2 weeks prior to admission, accompanied this she was also short of breath with exertion with a dry cough. Tonight she started having nausea so she decided to come to the ED. She relates her pain is better now, is not pleuritic she still gets short of breath with exertion and continues to have a dry cough, she has not been taking her Lasix. He denies any fever, nausea, vomiting, diarrhea. She denies any sick contacts ongoing tobacco smoking.  Pt seen for MST. BMI indicates underweight status. Pt with hx of COPD and stroke. Pt out of room versus in the restroom at time of visit. All information pulled from chart at this time with plan to follow-up with pt 05/18/15. Unable to complete physical assessment at this time but will do so at follow-up. Per chart review, pt weight has been mainly stable (80-88 lbs) x2 years. Most recently, she has gained 3 lbs in the past 5 months. Ensure Enlive has been ordered BID.   Will continue to monitor for needs. Per chart review, pt ate 100% of lunch 4/13 with no other intakes documented since admission. Medications reviewed. Labs reviewed; BUN/creatinine elevated and trending up, Ca: 8.8 mg/dL, GFR:  14.    Diet Order:  Diet 2 gram sodium Room service appropriate?: Yes; Fluid consistency:: Thin; Fluid restriction:: 1200 mL Fluid  Skin:  Reviewed, no issues  Last BM:  4/11  Height:   Ht Readings from Last 1 Encounters:  05/14/15 5\' 2"  (1.575 m)    Weight:   Wt Readings from Last 1 Encounters:  05/15/15 86 lb 13.8 oz (39.4 kg)    Ideal Body Weight:  50 kg (kg)  BMI:  Body mass index is 15.88 kg/(m^2).  Estimated Nutritional Needs:   Kcal:  1180-1380 (30-35 kcal/kg)  Protein:  60-70 grams  Fluid:  1.1-1.3 L/day  EDUCATION NEEDS:   No education needs identified at this time     Trenton Gammon, RD, LDN Inpatient Clinical Dietitian Pager # [phone] After hours/weekend pager # [phone]

## 2015-05-16 ENCOUNTER — Inpatient Hospital Stay (HOSPITAL_COMMUNITY): Payer: Commercial Managed Care - HMO

## 2015-05-16 DIAGNOSIS — G9341 Metabolic encephalopathy: Secondary | ICD-10-CM | POA: Diagnosis present

## 2015-05-16 DIAGNOSIS — I5041 Acute combined systolic (congestive) and diastolic (congestive) heart failure: Secondary | ICD-10-CM

## 2015-05-16 DIAGNOSIS — I5021 Acute systolic (congestive) heart failure: Secondary | ICD-10-CM

## 2015-05-16 HISTORY — DX: Metabolic encephalopathy: G93.41

## 2015-05-16 LAB — URINE MICROSCOPIC-ADD ON: RBC / HPF: NONE SEEN RBC/hpf (ref 0–5)

## 2015-05-16 LAB — BASIC METABOLIC PANEL
Anion gap: 13 (ref 5–15)
BUN: 69 mg/dL — ABNORMAL HIGH (ref 6–20)
CO2: 19 mmol/L — ABNORMAL LOW (ref 22–32)
Calcium: 8.3 mg/dL — ABNORMAL LOW (ref 8.9–10.3)
Chloride: 106 mmol/L (ref 101–111)
Creatinine, Ser: 3.19 mg/dL — ABNORMAL HIGH (ref 0.44–1.00)
GFR calc Af Amer: 16 mL/min — ABNORMAL LOW (ref >60)
GFR calc non Af Amer: 14 mL/min — ABNORMAL LOW (ref >60)
Glucose, Bld: 112 mg/dL — ABNORMAL HIGH (ref 65–99)
Potassium: 4.7 mmol/L (ref 3.5–5.1)
Sodium: 138 mmol/L (ref 135–145)

## 2015-05-16 LAB — TSH: TSH: 1.765 u[IU]/mL (ref 0.350–4.500)

## 2015-05-16 LAB — VITAMIN B12: Vitamin B-12: 516 pg/mL (ref 180–914)

## 2015-05-16 LAB — URINALYSIS, ROUTINE W REFLEX MICROSCOPIC
Bilirubin Urine: NEGATIVE
Glucose, UA: NEGATIVE mg/dL
Hgb urine dipstick: NEGATIVE
Ketones, ur: NEGATIVE mg/dL
Nitrite: NEGATIVE
Protein, ur: 30 mg/dL — AB
Specific Gravity, Urine: 1.016 (ref 1.005–1.030)
pH: 5.5 (ref 5.0–8.0)

## 2015-05-16 LAB — AMMONIA: Ammonia: 40 umol/L — ABNORMAL HIGH (ref 9–35)

## 2015-05-16 NOTE — Progress Notes (Addendum)
PROGRESS NOTE  [name] LSL:[id number] DOB: September 09, 1940 DOA: 05/14/2015 PCP: [name], MD   Brief History 75 year old female who presents with 1-2 week history of shortness of breath and dyspnea on exertion with associated dizziness. The patient states that in the past 3 days she has had a worsening of her symptoms with intermittent chest discomfort. She denies any fevers, chills, nausea, vomiting, diarrhea, syncope, mechanical falls. She states that her primary care physician recently contacted her and told her to stop her furosemide. She has been off of for furosemide for slightly over one week prior to this admission. Initial workup revealed that the patient had small bilateral pleural effusions and decompensated CHF. The patient was started on intravenous furosemide with good clinical improvement.  Assessment/Plan: Acute on chronic systolic and diastolic CHF -good clinical improvement with IV lasix -hold furosemide today due to increased Scr and monitor clinically-->3.17 -she appears euvolemic today -05/15/15--Echocardiogram--EF 25-30%, grade 3 DD, wide open TR, mod-severe RV dysfxn - TR contributing to patient's JVD -Daily weights--stable -Continue carvedilol -Unable to use ACE inhibitor secondary to renal insufficiency -due to worsen Echo findings compared to 12/29/12--consult cardiology  Elevated troponin -Likely due to decompensated CHF -No chest pain presently -Trend is flat  Acute on chronic renal failure--CKD stage III -Baseline creatinine 1.2-1.6 -Due to diuresis -dc colchicine for now  Acute encephalopathy -multifactorial including CHF, renal failure, hospital delirium -check for reversible causes -TSH -Serum B12 -ammonia -UA  Severe protein calorie malnutrition -Continue measures for supplementation  Hyperkalemia -Improved with diuresis  Seizure disorder -Continue Vimpat and Keppra  History of stroke -Continue  aspirin   Family Communication: Daughter updated at beside--total time 35 min >50% spent counseling and coordinating care Disposition Plan: not stable for d/c    Procedures/Studies: Dg Chest 2 View  05/12/2015  CLINICAL DATA:  Low back pain.  Posterior chest pain. EXAM: CHEST  2 VIEW COMPARISON:  03/08/2014 FINDINGS: Cardiac enlargement is similar to the prior study. Negative for heart failure. Small bilateral pleural effusions. Mild streaky density in both lung bases consistent with atelectasis or infiltrate. No mass lesion. Nipple shadow on the left noted Severe compression fracture mid thoracic spine of indeterminate age. Most recent lateral view was 02/25/2013 and the fracture was not present at that time IMPRESSION: Cardiac enlargement with small pleural effusions. Negative for heart failure Mild bibasilar atelectasis/infiltrate. Severe mid thoracic compression fracture indeterminate age. Electronically Signed   By: Marlan Palau M.D.   On: 05/12/2015 15:41   Dg Lumbar Spine 2-3 Views  05/12/2015  CLINICAL DATA:  Low back pain EXAM: LUMBAR SPINE - 2-3 VIEW COMPARISON:  02/19/2013 FINDINGS: Moderate compression fracture L2 is chronic and unchanged. Mild to moderate levoscoliosis unchanged. No new fracture. L5 is partially sacralized. Generalized osteopenia. No significant disc degeneration. 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 No acute abnormality. Electronically Signed   By: Marlan Palau M.D.   On: 05/12/2015 15:32         Subjective: Patient appears a little confused today. However she is awake and alert and able to answer some questions appropriately. Denies any fever, chills, chest discomfort, shortness breath, nausea, vomiting, diarrhea, abdominal pain, headache, neck pain.  Objective: Filed Vitals:   05/15/15 2113 05/16/15 0456 05/16/15 0601 05/16/15 0749  BP: 105/74  119/72 114/82  Pulse: 89  93 89  Temp: 98.7 F (37.1 C)  98.4 F (36.9 C)    TempSrc: Oral  Oral  Resp: Height:      Weight:  38.828 kg (85 lb 9.6 oz) 39.055 kg (86 lb 1.6 oz)   SpO2: 94%  95% 94%    Intake/Output Summary (Last 24 hours) at 05/16/15 1052 Last data filed at 05/16/15 0900  Gross per 24 hour  Intake    480 ml  Output      0 ml  Net    480 ml   Weight change: -0.372 kg (-13.1 oz) Exam:   General:  Pt is alert, follows commands appropriately, not in acute distress  HEENT: No icterus, No thrush, No neck mass, [place]/AT  Cardiovascular: RRR, S1/S2, no rubs, no gallops  Respiratory: fine bibasilar crackles. No wheezing. Good air movement.  Abdomen: Soft/+BS, non tender, non distended, no guarding  Extremities: No edema, No lymphangitis, No petechiae, No rashes, no synovitis  Data Reviewed: Basic Metabolic Panel:  Recent Labs Lab 05/14/15 0356 05/14/15 1151 05/15/15 0501 05/16/15 0448  NA 137  --  138 138  K 5.5*  --  5.1 4.7  CL 106  --  105 106  CO2 19*  --  18* 19*  GLUCOSE 121*  --  110* 112*  BUN 28*  --  53* 69*  CREATININE 1.68*  --  3.17* 3.19*  CALCIUM 8.9  --  8.8* 8.3*  MG  --  2.2  --   --    Liver Function Tests:  Recent Labs Lab 05/14/15 0356  AST 85*  ALT 74*  ALKPHOS 158*  BILITOT 1.0  PROT 7.9  ALBUMIN 4.3   No results for input(s): LIPASE, AMYLASE in the last 168 hours. No results for input(s): AMMONIA in the last 168 hours. CBC:  Recent Labs Lab 05/14/15 0356  WBC 9.6  NEUTROABS 7.4  HGB 10.4*  HCT 30.7*  MCV 95.0  PLT 350   Cardiac Enzymes:  Recent Labs Lab 05/14/15 0609 05/14/15 1151 05/14/15 1745 05/14/15 2337  TROPONINI 0.08* 0.08* 0.08* 0.08*   BNP: Invalid input(s): POCBNP CBG:  Recent Labs Lab 05/14/15 0210  GLUCAP 116*    Recent Results (from the past 240 hour(s))  Urine culture     Status: None   Collection Time: 05/14/15  3:26 AM  Result Value Ref Range Status   Specimen Description URINE, CLEAN CATCH  Final   Special Requests NONE  Final    Culture MULTIPLE SPECIES PRESENT, SUGGEST RECOLLECTION  Final   Report Status 05/15/2015 FINAL  Final     Scheduled Meds: . allopurinol  200 mg Oral Daily  . aspirin  325 mg Oral q morning - 10a  . calcitRIOL  0.25 mcg Oral Daily  . carvedilol  6.25 mg Oral BID WC  . cyclobenzaprine  5 mg Oral QHS  . feeding supplement (ENSURE ENLIVE)  237 mL Oral BID BM  . heparin  5,000 Units Subcutaneous 3 times per day  . lacosamide  150 mg Oral BID  . levETIRAcetam  500 mg Oral QHS  . sodium chloride flush  3 mL Intravenous Q12H  . sodium chloride flush  3 mL Intravenous Q12H   Continuous Infusions:    Brookelynn Hamor, DO  [facility] Pager [phone]  If 7PM-7AM, please contact night-coverage [email or web address] Password [email or web address] 05/16/2015, 10:52 AM   LOS: 2 days

## 2015-05-16 NOTE — Consult Note (Signed)
Reason for Consult:acute on chronic systolic heart failure  Referring Physician: Dr. Arbutus Leas  [name] is an 75 y.o. female.   HPI: The patient is a 75 yo woman with a h/o HTN, stroke, and CAD, who has had worsening CHF symptoms over the past few months. She now has class 3 symptoms despite medical therapy and she was again admitted with worsening CHF. She has been given IV diuresis and she has improved clinically but her serum creatinine has increased and we are asked to give additional rec's. The patient denies chest pain. She has mild peripheral edema. She denies syncope.   PMH: Past Medical History  Diagnosis Date  . MI (myocardial infarction) (HCC)   . Hypertension   . Stroke (HCC)   . Congestive heart failure (CHF) (HCC)     EF 35% Nov 2014  . Epilepsy (HCC)   . Smoker     PSHX: Past Surgical History  Procedure Laterality Date  . No past surgeries      FAMHX: Family History  Problem Relation Age of Onset  . Cancer Father   . Anemia Daughter     Social History:  reports that she has been smoking Cigarettes.  She has been smoking about 0.10 packs per day. She has never used smokeless tobacco. She reports that she drinks alcohol. She reports that she does not use illicit drugs.  Allergies: No Known Allergies  Medications: I have reviewed the patient's current medications.  US Renal  05/16/2015  CLINICAL DATA:  75 year old female with acute on chronic renal failure EXAM: RENAL / URINARY TRACT ULTRASOUND COMPLETE COMPARISON:  Prior renal ultrasound 03/06/2014 FINDINGS: Right Kidney: Length: 8.4 cm. No evidence of hydronephrosis. Significant echogenicity of the renal parenchyma with increased conspicuity of the corticomedullary interface. There is mild perinephric fluid consistent with perinephric sweat. Left Kidney: Length: 9.1 cm. No evidence of hydronephrosis. Significant echogenicity of the renal parenchyma with increased conspicuity of the corticomedullary interface.  There is mild perinephric fluid consistent with perinephric sweat. Bladder: Appears normal for degree of bladder distention. IMPRESSION: 1. Significantly echogenic kidneys bilaterally consistent with medical renal disease. 2. Mild perinephric fluid (perinephric 'sweat' sign) bilaterally which can be seen in the setting of acute renal failure. 3. No evidence of hydronephrosis. Electronically Signed   By: Malachy Moan M.D.   On: 05/16/2015 15:42    ROS  As stated in the HPI and negative for all other systems.  Physical Exam  Vitals:Blood pressure 124/82, pulse 88, temperature 98.2 F (36.8 C), temperature source Oral, resp. rate 20, height 5\' 2"  (1.575 m), weight 86 lb 1.6 oz (39.055 kg), SpO2 98 %.  Chronically ill appearing 75 yo woman, frail, sleepy, NAD HEENT: Unremarkable Neck:  9 cm JVD, no thyromegally Lymphatics:  No adenopathy Back:  No CVA tenderness Lungs:  Clear except for basilar rales HEART:  Regular rate rhythm, no murmurs, no rubs, no clicks Abd:  Flat, soft, positive bowel sounds, no organomegally, no rebound, no guarding Ext:  2 plus pulses, no edema, no cyanosis, no clubbing Skin:  No rashes no nodules Neuro:  CN II through XII intact, motor grossly intact  ECG - NSR with IVCD  Echo - reviewed - EF 30%, marked atrial enlargement. RV dysfunction. EF is down from 40% several months ago  Labs - reviewed  Assessment/Plan: 1. Acute on chronic systolic heart failure - the patient has class 3B symptoms. She has had an increased in her creatinine with diuretic therapy. Her treatment options  are limited. Would consider ionotropes but will discuss the case with our CHF specialists.  2. Acute on chronic renal failure - her creatinine has stabilized after double in one day. Diuretic therapy and nephrotoxic drugs will be held. Consider nephrology consult if creatinine is not improving 3. cerebrovascular disease - the patient is somnolent today but I did not detect any focal  findings.    [name] 05/16/2015, 3:49 PM

## 2015-05-17 ENCOUNTER — Inpatient Hospital Stay (HOSPITAL_COMMUNITY): Payer: Commercial Managed Care - HMO

## 2015-05-17 DIAGNOSIS — N183 Chronic kidney disease, stage 3 (moderate): Secondary | ICD-10-CM

## 2015-05-17 DIAGNOSIS — E7220 Disorder of urea cycle metabolism, unspecified: Secondary | ICD-10-CM

## 2015-05-17 DIAGNOSIS — N179 Acute kidney failure, unspecified: Secondary | ICD-10-CM

## 2015-05-17 HISTORY — DX: Disorder of urea cycle metabolism, unspecified: E72.20

## 2015-05-17 LAB — BASIC METABOLIC PANEL
Anion gap: 10 (ref 5–15)
BUN: 64 mg/dL — ABNORMAL HIGH (ref 6–20)
CO2: 23 mmol/L (ref 22–32)
Calcium: 9.1 mg/dL (ref 8.9–10.3)
Chloride: 106 mmol/L (ref 101–111)
Creatinine, Ser: 2.67 mg/dL — ABNORMAL HIGH (ref 0.44–1.00)
GFR calc Af Amer: 19 mL/min — ABNORMAL LOW (ref >60)
GFR calc non Af Amer: 17 mL/min — ABNORMAL LOW (ref >60)
Glucose, Bld: 120 mg/dL — ABNORMAL HIGH (ref 65–99)
Potassium: 4.9 mmol/L (ref 3.5–5.1)
Sodium: 139 mmol/L (ref 135–145)

## 2015-05-17 LAB — CARBOXYHEMOGLOBIN
Carboxyhemoglobin: 1.3 % (ref 0.5–1.5)
Methemoglobin: 0.8 % (ref 0.0–1.5)
O2 Saturation: 42.3 %
Total hemoglobin: 8.7 g/dL — ABNORMAL LOW (ref 12.0–16.0)

## 2015-05-17 MED ORDER — SODIUM CHLORIDE 0.9% FLUSH
10.0000 mL | INTRAVENOUS | Status: DC | PRN
  Administered 2015-05-18 – 2015-05-22 (×6): 10 mL
  Filled 2015-05-17 (×6): qty 40

## 2015-05-17 MED ORDER — LACTULOSE 10 GM/15ML PO SOLN
20.0000 g | Freq: Two times a day (BID) | ORAL | Status: DC
  Administered 2015-05-17 – 2015-05-21 (×9): 20 g via ORAL
  Filled 2015-05-17 (×9): qty 30

## 2015-05-17 MED ORDER — SODIUM CHLORIDE 0.9% FLUSH
10.0000 mL | Freq: Two times a day (BID) | INTRAVENOUS | Status: DC
  Administered 2015-05-22: 10 mL

## 2015-05-17 MED ORDER — MILRINONE IN DEXTROSE 20 MG/100ML IV SOLN
0.1250 ug/kg/min | INTRAVENOUS | Status: DC
  Administered 2015-05-17: 0.125 ug/kg/min via INTRAVENOUS
  Filled 2015-05-17: qty 100

## 2015-05-17 NOTE — Progress Notes (Signed)
Pharmacist Heart Failure Core Measure Documentation  Assessment: [name] has an EF documented as 25-30% on 05/15/15 by ECHO.  Rationale: Heart failure patients with left ventricular systolic dysfunction (LVSD) and an EF < 40% should be prescribed an angiotensin converting enzyme inhibitor (ACEI) or angiotensin receptor blocker (ARB) at discharge unless a contraindication is documented in the medical record.  This patient is not currently on an ACEI or ARB for HF.  This note is being placed in the record in order to provide documentation that a contraindication to the use of these agents is present for this encounter.  ACE Inhibitor or Angiotensin Receptor Blocker is contraindicated (specify all that apply)  []   ACEI allergy AND ARB allergy []   Angioedema []   Moderate or severe aortic stenosis [x]   Hyperkalemia [x]   Hypotension []   Renal artery stenosis [x]   Worsening renal function, preexisting renal disease or dysfunction   Elson Clan 05/17/2015 12:44 PM

## 2015-05-17 NOTE — Progress Notes (Signed)
Report received from S. Randa Lynn, RN. No change from initial pm assessment. Will continue to monitor and follow the POC.

## 2015-05-17 NOTE — Progress Notes (Signed)
Subjective: I still feel bad  No CP  Breathing is fair  Objective: Filed Vitals:   05/16/15 1426 05/16/15 1717 05/16/15 2024 05/17/15 0448  BP: 124/82 113/79 103/61 121/80  Pulse: 88 89 88 94  Temp: 98.2 F (36.8 C)  98.1 F (36.7 C) 98.1 F (36.7 C)  TempSrc: Oral  Oral Oral  Resp: 20 18 20 16   Height:      Weight:    87 lb (39.463 kg)  SpO2: 98% 98% 99% 97%   Weight change: 1 lb 6.4 oz (0.635 kg)  Intake/Output Summary (Last 24 hours) at 05/17/15 0612 Last data filed at 05/17/15 0238  Gross per 24 hour  Intake    720 ml  Output    450 ml  Net    270 ml   I/O  INcomplete  General: Alert, awake, oriented x3, in no acute distress Neck:  JVP is increased   Heart: Regular rate and rhythm, without murmurs, rubs, gallops.  Lungs: Clear to auscultation.  No rales or wheezes. Abd  MIld RUQ tendernes   Exemities:  No edema.   Neuro: Grossly intact, nonfocal.  Tele:  SR   Lab Results: Results for orders placed or performed during the hospital encounter of 05/14/15 (from the past 24 hour(s))  Vitamin B12     Status: None   Collection Time: 05/16/15 11:25 AM  Result Value Ref Range   Vitamin B-12 516 180 - 914 pg/mL  TSH     Status: None   Collection Time: 05/16/15 11:25 AM  Result Value Ref Range   TSH 1.765 0.350 - 4.500 uIU/mL  Ammonia     Status: Abnormal   Collection Time: 05/16/15 11:25 AM  Result Value Ref Range   Ammonia 40 (H) 9 - 35 umol/L  Urinalysis, Routine w reflex microscopic (not at [facility])     Status: Abnormal   Collection Time: 05/16/15  4:05 PM  Result Value Ref Range   Color, Urine YELLOW YELLOW   APPearance CLEAR CLEAR   Specific Gravity, Urine 1.016 1.005 - 1.030   pH 5.5 5.0 - 8.0   Glucose, UA NEGATIVE NEGATIVE mg/dL   Hgb urine dipstick NEGATIVE NEGATIVE   Bilirubin Urine NEGATIVE NEGATIVE   Ketones, ur NEGATIVE NEGATIVE mg/dL   Protein, ur 30 (A) NEGATIVE mg/dL   Nitrite NEGATIVE NEGATIVE   Leukocytes, UA SMALL (A) NEGATIVE  Urine  microscopic-add on     Status: Abnormal   Collection Time: 05/16/15  4:05 PM  Result Value Ref Range   Squamous Epithelial / LPF 0-5 (A) NONE SEEN   WBC, UA 0-5 0 - 5 WBC/hpf   RBC / HPF NONE SEEN 0 - 5 RBC/hpf   Bacteria, UA FEW (A) NONE SEEN    Studies/Results: US Renal  05/16/2015  CLINICAL DATA:  75 year old female with acute on chronic renal failure EXAM: RENAL / URINARY TRACT ULTRASOUND COMPLETE COMPARISON:  Prior renal ultrasound 03/06/2014 FINDINGS: Right Kidney: Length: 8.4 cm. No evidence of hydronephrosis. Significant echogenicity of the renal parenchyma with increased conspicuity of the corticomedullary interface. There is mild perinephric fluid consistent with perinephric sweat. Left Kidney: Length: 9.1 cm. No evidence of hydronephrosis. Significant echogenicity of the renal parenchyma with increased conspicuity of the corticomedullary interface. There is mild perinephric fluid consistent with perinephric sweat. Bladder: Appears normal for degree of bladder distention. IMPRESSION: 1. Significantly echogenic kidneys bilaterally consistent with medical renal disease. 2. Mild perinephric fluid (perinephric 'sweat' sign) bilaterally which can be seen in  the setting of acute renal failure. 3. No evidence of hydronephrosis. Electronically Signed   By: Malachy Moan M.D.   On: 05/16/2015 15:42    Medications: Revoewed   @  1  Acute on chronic systolic CHF  No diuress   Will have PICC placed for [place] measurement   Pt with severe dysfunction of both R and L ventricles witth severe TR    2  Renal  Cr improved some  Not at baseline  Lasix on hold    3  HTN  BP ok    LOS: 3 days   Dietrich Pates 05/17/2015, 6:12 AM

## 2015-05-17 NOTE — Progress Notes (Signed)
Peripherally Inserted Central Catheter/Midline Placement  The IV Nurse has discussed with the patient and/or persons authorized to consent for the patient, the purpose of this procedure and the potential benefits and risks involved with this procedure.  The benefits include less needle sticks, lab draws from the catheter and patient may be discharged home with the catheter.  Risks include, but not limited to, infection, bleeding, blood clot (thrombus formation), and puncture of an artery; nerve damage and irregular heat beat.  Alternatives to this procedure were also discussed.  PICC/Midline Placement Documentation  PICC Double Lumen 05/17/15 PICC Right Brachial 31 cm 0 cm (Active)  Indication for Insertion or Continuance of Line Vasoactive infusions 05/17/2015  6:19 PM  Exposed Catheter (cm) 0 cm 05/17/2015  6:19 PM  Site Assessment Clean;Dry;Intact 05/17/2015  6:19 PM  Lumen #1 Status Flushed;Saline locked;Blood return noted 05/17/2015  6:19 PM  Lumen #2 Status Flushed;Saline locked;Blood return noted 05/17/2015  6:19 PM  Dressing Type Transparent 05/17/2015  6:19 PM  Dressing Status Clean;Dry;Intact;Antimicrobial disc in place 05/17/2015  6:19 PM  Line Care Connections checked and tightened 05/17/2015  6:19 PM  Line Adjustment (NICU/IV Team Only) No 05/17/2015  6:19 PM  Dressing Intervention New dressing 05/17/2015  6:19 PM  Dressing Change Due 05/24/15 05/17/2015  6:19 PM       Elliot Dally 05/17/2015, 6:20 PM

## 2015-05-17 NOTE — Progress Notes (Signed)
PROGRESS NOTE  [name] ZOX:[id number] DOB: 1940/05/09 DOA: 05/14/2015 PCP: [name], MD Brief History 75 year old female who presents with 1-2 week history of shortness of breath and dyspnea on exertion with associated dizziness. The patient states that in the past 3 days she has had a worsening of her symptoms with intermittent chest discomfort. She denies any fevers, chills, nausea, vomiting, diarrhea, syncope, mechanical falls. She states that her primary care physician recently contacted her and told her to stop her furosemide. She has been off of for furosemide for slightly over one week prior to this admission. Initial workup revealed that the patient had small bilateral pleural effusions and decompensated CHF. The patient was started on intravenous furosemide with good clinical improvement.  Assessment/Plan: Acute on chronic systolic and diastolic CHF -good clinical improvement with IV lasix -hold furosemide today due to increased Scr and monitor clinically-->3.17 -she appears euvolemic today -05/15/15--Echocardiogram--EF 25-30%, grade 3 DD, wide open TR, mod-severe RV dysfxn - TR contributing to patient's JVD -Daily weights--stable -Continue carvedilol -Unable to use ACE inhibitor secondary to renal insufficiency -due to worsen Echo findings compared to 12/29/12--consult cardiology -appreciate cardiology consult  Elevated troponin -Likely due to decompensated CHF -No chest pain presently -Trend is flat  Acute on chronic renal failure--CKD stage III -Baseline creatinine 1.2-1.6 -Due to diuresis -dc colchicine for now  Acute encephalopathy -multifactorial including CHF, renal failure, hospital delirium -check for reversible causes -TSH--1.765 -Serum B12--516 -ammonia--40 -UA--no pyuria  Hyperammonemia -trial of lactulose to see if mentation improves  -secondary to chronic hepatic congestion from R-heart failure -AXR for abdominal  pain  Severe protein calorie malnutrition -Continue measures for supplementation  Hyperkalemia -Improved with diuresis  Seizure disorder -Continue Vimpat and Keppra  History of stroke -Continue aspirin  Procedures/Studies: Dg Chest 2 View  05/12/2015  CLINICAL DATA:  Low back pain.  Posterior chest pain. EXAM: CHEST  2 VIEW COMPARISON:  03/08/2014 FINDINGS: Cardiac enlargement is similar to the prior study. Negative for heart failure. Small bilateral pleural effusions. Mild streaky density in both lung bases consistent with atelectasis or infiltrate. No mass lesion. Nipple shadow on the left noted Severe compression fracture mid thoracic spine of indeterminate age. Most recent lateral view was 02/25/2013 and the fracture was not present at that time IMPRESSION: Cardiac enlargement with small pleural effusions. Negative for heart failure Mild bibasilar atelectasis/infiltrate. Severe mid thoracic compression fracture indeterminate age. Electronically Signed   By: Marlan Palau M.D.   On: 05/12/2015 15:41   Dg Lumbar Spine 2-3 Views  05/12/2015  CLINICAL DATA:  Low back pain EXAM: LUMBAR SPINE - 2-3 VIEW COMPARISON:  02/19/2013 FINDINGS: Moderate compression fracture L2 is chronic and unchanged. Mild to moderate levoscoliosis unchanged. No new fracture. L5 is partially sacralized. Generalized osteopenia. No significant disc degeneration. 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 No acute abnormality. Electronically Signed   By: Marlan Palau M.D.   On: 05/12/2015 15:32   US Renal  05/16/2015  CLINICAL DATA:  75 year old female with acute on chronic renal failure EXAM: RENAL / URINARY TRACT ULTRASOUND COMPLETE COMPARISON:  Prior renal ultrasound 03/06/2014 FINDINGS: Right Kidney: Length: 8.4 cm. No evidence of hydronephrosis. Significant echogenicity of the renal parenchyma with increased conspicuity of the corticomedullary interface. There is mild perinephric fluid consistent  with perinephric sweat. Left Kidney: Length: 9.1 cm. No evidence of hydronephrosis. Significant echogenicity of the renal parenchyma with increased conspicuity of the corticomedullary interface. There is mild perinephric  fluid consistent with perinephric sweat. Bladder: Appears normal for degree of bladder distention. IMPRESSION: 1. Significantly echogenic kidneys bilaterally consistent with medical renal disease. 2. Mild perinephric fluid (perinephric 'sweat' sign) bilaterally which can be seen in the setting of acute renal failure. 3. No evidence of hydronephrosis. Electronically Signed   By: Malachy Moan M.D.   On: 05/16/2015 15:42         Subjective: Patient denies any fevers, chills, chest pain, short of breath, nausea, vomiting, diarrhea. She complains of left-sided abdominal pain. Denies any diarrhea, hematochezia, melena, dysuria, hematuria.  Objective: Filed Vitals:   05/16/15 1426 05/16/15 1717 05/16/15 2024 05/17/15 0448  BP: 124/82 113/79 103/61 121/80  Pulse: 88 89 88 94  Temp: 98.2 F (36.8 C)  98.1 F (36.7 C) 98.1 F (36.7 C)  TempSrc: Oral  Oral Oral  Resp: Height:      Weight:    39.463 kg (87 lb)  SpO2: 98% 98% 99% 97%    Intake/Output Summary (Last 24 hours) at 05/17/15 1454 Last data filed at 05/17/15 1610  Gross per 24 hour  Intake    360 ml  Output    450 ml  Net    -90 ml   Weight change: 0.635 kg (1 lb 6.4 oz) Exam:   General:  Pt is alert, follows commands appropriately, not in acute distress  HEENT: No icterus, No thrush, No neck mass, [place]/AT  Cardiovascular: RRR, S1/S2, no rubs, no gallops  Respiratory: Bibasilar crackles. No wheezing. Good air movement  Abdomen: Soft/+BS, non tender, non distended, no guarding; no hepatosplenomegaly  Extremities: trace LE edema, No lymphangitis, No petechiae, No rashes, no synovitis  Data Reviewed: Basic Metabolic Panel:  Recent Labs Lab 05/14/15 0356 05/14/15 1151 05/15/15 0501  05/16/15 0448 05/17/15 0530  NA 137  --  138 138 139  K 5.5*  --  5.1 4.7 4.9  CL 106  --  105 106 106  CO2 19*  --  18* 19* 23  GLUCOSE 121*  --  110* 112* 120*  BUN 28*  --  53* 69* 64*  CREATININE 1.68*  --  3.17* 3.19* 2.67*  CALCIUM 8.9  --  8.8* 8.3* 9.1  MG  --  2.2  --   --   --    Liver Function Tests:  Recent Labs Lab 05/14/15 0356  AST 85*  ALT 74*  ALKPHOS 158*  BILITOT 1.0  PROT 7.9  ALBUMIN 4.3   No results for input(s): LIPASE, AMYLASE in the last 168 hours.  Recent Labs Lab 05/16/15 1125  AMMONIA 40*   CBC:  Recent Labs Lab 05/14/15 0356  WBC 9.6  NEUTROABS 7.4  HGB 10.4*  HCT 30.7*  MCV 95.0  PLT 350   Cardiac Enzymes:  Recent Labs Lab 05/14/15 0609 05/14/15 1151 05/14/15 1745 05/14/15 2337  TROPONINI 0.08* 0.08* 0.08* 0.08*   BNP: Invalid input(s): POCBNP CBG:  Recent Labs Lab 05/14/15 0210  GLUCAP 116*    Recent Results (from the past 240 hour(s))  Urine culture     Status: None   Collection Time: 05/14/15  3:26 AM  Result Value Ref Range Status   Specimen Description URINE, CLEAN CATCH  Final   Special Requests NONE  Final   Culture MULTIPLE SPECIES PRESENT, SUGGEST RECOLLECTION  Final   Report Status 05/15/2015 FINAL  Final     Scheduled Meds: . allopurinol  200 mg Oral Daily  . aspirin  325 mg  Oral q morning - 10a  . calcitRIOL  0.25 mcg Oral Daily  . carvedilol  6.25 mg Oral BID WC  . cyclobenzaprine  5 mg Oral QHS  . feeding supplement (ENSURE ENLIVE)  237 mL Oral BID BM  . heparin  5,000 Units Subcutaneous 3 times per day  . lacosamide  150 mg Oral BID  . levETIRAcetam  500 mg Oral QHS  . sodium chloride flush  3 mL Intravenous Q12H  . sodium chloride flush  3 mL Intravenous Q12H   Continuous Infusions:    Nashay Brickley, DO  [facility] Pager [phone]  If 7PM-7AM, please contact night-coverage [email or web address] Password [email or web address] 05/17/2015, 2:54 PM   LOS: 3 days

## 2015-05-18 DIAGNOSIS — I130 Hypertensive heart and chronic kidney disease with heart failure and stage 1 through stage 4 chronic kidney disease, or unspecified chronic kidney disease: Principal | ICD-10-CM

## 2015-05-18 DIAGNOSIS — E7220 Disorder of urea cycle metabolism, unspecified: Secondary | ICD-10-CM

## 2015-05-18 DIAGNOSIS — I509 Heart failure, unspecified: Secondary | ICD-10-CM

## 2015-05-18 LAB — FOLATE RBC
Folate, Hemolysate: 305.3 ng/mL
Folate, RBC: 1110 ng/mL (ref >498)
Hematocrit: 27.5 % — ABNORMAL LOW (ref 34.0–46.6)

## 2015-05-18 LAB — BASIC METABOLIC PANEL
Anion gap: 11 (ref 5–15)
BUN: 53 mg/dL — ABNORMAL HIGH (ref 6–20)
CO2: 24 mmol/L (ref 22–32)
Calcium: 9.3 mg/dL (ref 8.9–10.3)
Chloride: 108 mmol/L (ref 101–111)
Creatinine, Ser: 2.07 mg/dL — ABNORMAL HIGH (ref 0.44–1.00)
GFR calc Af Amer: 26 mL/min — ABNORMAL LOW (ref >60)
GFR calc non Af Amer: 23 mL/min — ABNORMAL LOW (ref >60)
Glucose, Bld: 104 mg/dL — ABNORMAL HIGH (ref 65–99)
Potassium: 4.7 mmol/L (ref 3.5–5.1)
Sodium: 143 mmol/L (ref 135–145)

## 2015-05-18 LAB — URINE CULTURE

## 2015-05-18 MED ORDER — ISOSORBIDE MONONITRATE ER 30 MG PO TB24
15.0000 mg | ORAL_TABLET | Freq: Two times a day (BID) | ORAL | Status: DC
  Administered 2015-05-18 – 2015-05-19 (×2): 15 mg via ORAL
  Filled 2015-05-18 (×2): qty 1

## 2015-05-18 MED ORDER — HYDRALAZINE HCL 25 MG PO TABS
25.0000 mg | ORAL_TABLET | Freq: Two times a day (BID) | ORAL | Status: DC
  Administered 2015-05-18 – 2015-05-19 (×2): 25 mg via ORAL
  Filled 2015-05-18 (×2): qty 1

## 2015-05-18 NOTE — Progress Notes (Signed)
PROGRESS NOTE  [name] ZOX:[id number] DOB: 04/10/1940 DOA: 05/14/2015 PCP: [name], MD  Brief History 75 year old female who presents with 1-2 week history of shortness of breath and dyspnea on exertion with associated dizziness. The patient states that in the past 3 days she has had a worsening of her symptoms with intermittent chest discomfort. She denies any fevers, chills, nausea, vomiting, diarrhea, syncope, mechanical falls. She states that her primary care physician recently contacted her and told her to stop her furosemide. She has been off of for furosemide for slightly over one week prior to this admission. Initial workup revealed that the patient had small bilateral pleural effusions and decompensated CHF. The patient was started on intravenous furosemide with good clinical improvement.  Assessment/Plan: Acute on chronic systolic and diastolic CHF -good clinical improvement with IV lasix -hold furosemide today due to increased Scr and monitor clinically-->3.17-->2.07 -05/15/15--Echocardiogram--EF 25-30%, grade 3 DD, wide open TR, mod-severe RV dysfxn - TR contributing to patient's JVD -Daily weights--stable -Continue carvedilol -Unable to use ACE inhibitor secondary to renal insufficiency -due to worsen Echo findings compared to 12/29/12--consult cardiology -appreciate cardiology--> started milrinone 05/17/2015 -Patient felt appreciably better since starting milrinone  Elevated troponin -Likely due to decompensated CHF -No chest pain presently -Trend is flat  Acute on chronic renal failure--CKD stage III -Baseline creatinine 1.2-1.6 -Due to diuresis and biventricular failure -dc colchicine for now  Acute encephalopathy -multifactorial including CHF, renal failure, hospital delirium -check for reversible causes -TSH--1.765 -Serum B12--516 -ammonia--40 -UA--no pyuria  Hyperammonemia -trial of lactulose to see if mentation improves   -secondary to chronic hepatic congestion from R-heart failure -05/17/2015 AXR--distended stomach, no obstruction, no free air -Recheck ammonia morning  Severe protein calorie malnutrition -Continue measures for supplementation  Hyperkalemia -Improved with diuresis  Seizure disorder -Continue Vimpat and Keppra  History of stroke -Continue aspirin  Family Communication:   Son and daughter at beside 05/18/2015 Disposition Plan:  Not stable for d/c--on milrinone      Procedures/Studies: Dg Chest 2 View  05/12/2015  CLINICAL DATA:  Low back pain.  Posterior chest pain. EXAM: CHEST  2 VIEW COMPARISON:  03/08/2014 FINDINGS: Cardiac enlargement is similar to the prior study. Negative for heart failure. Small bilateral pleural effusions. Mild streaky density in both lung bases consistent with atelectasis or infiltrate. No mass lesion. Nipple shadow on the left noted Severe compression fracture mid thoracic spine of indeterminate age. Most recent lateral view was 02/25/2013 and the fracture was not present at that time IMPRESSION: Cardiac enlargement with small pleural effusions. Negative for heart failure Mild bibasilar atelectasis/infiltrate. Severe mid thoracic compression fracture indeterminate age. Electronically Signed   By: Marlan Palau M.D.   On: 05/12/2015 15:41   Dg Lumbar Spine 2-3 Views  05/12/2015  CLINICAL DATA:  Low back pain EXAM: LUMBAR SPINE - 2-3 VIEW COMPARISON:  02/19/2013 FINDINGS: Moderate compression fracture L2 is chronic and unchanged. Mild to moderate levoscoliosis unchanged. No new fracture. L5 is partially sacralized. Generalized osteopenia. No significant disc degeneration. 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 No acute abnormality. Electronically Signed   By: Marlan Palau M.D.   On: 05/12/2015 15:32   US Renal  05/16/2015  CLINICAL DATA:  75 year old female with acute on chronic renal failure EXAM: RENAL / URINARY TRACT ULTRASOUND  COMPLETE COMPARISON:  Prior renal ultrasound 03/06/2014 FINDINGS: Right Kidney: Length: 8.4 cm. No evidence of hydronephrosis. Significant echogenicity of the renal parenchyma with increased conspicuity  of the corticomedullary interface. There is mild perinephric fluid consistent with perinephric sweat. Left Kidney: Length: 9.1 cm. No evidence of hydronephrosis. Significant echogenicity of the renal parenchyma with increased conspicuity of the corticomedullary interface. There is mild perinephric fluid consistent with perinephric sweat. Bladder: Appears normal for degree of bladder distention. IMPRESSION: 1. Significantly echogenic kidneys bilaterally consistent with medical renal disease. 2. Mild perinephric fluid (perinephric 'sweat' sign) bilaterally which can be seen in the setting of acute renal failure. 3. No evidence of hydronephrosis. Electronically Signed   By: Malachy Moan M.D.   On: 05/16/2015 15:42   Dg Abd 2 Views  05/17/2015  CLINICAL DATA:  Left lower quadrant pain for 3 days EXAM: ABDOMEN - 2 VIEW COMPARISON:  05/12/2015 FINDINGS: The stomach is distended with air and ingested material. Scattered large and small bowel gas is noted without obstructive change. No free air is seen. No abnormal mass or abnormal calcifications are noted. There changes of prior L2 fracture stable in appearance. IMPRESSION: Somewhat distended stomach. No obstructive changes are seen.  No free air is noted. Electronically Signed   By: Alcide Clever M.D.   On: 05/17/2015 17:06         Subjective: Patient feels significantly improved with milrinone. She denies any chest discomfort and raspy breath, nausea, vomiting, diarrhea patient to bowel movements. Denies any hematochezia or melena.. Denies any headache or neck pain.   Objective: Filed Vitals:   05/18/15 0102 05/18/15 0304 05/18/15 0517 05/18/15 1409  BP: 134/85 114/75 116/74 142/96  Pulse: 101 92 93 105  Temp:   98 F (36.7 C) 97.6 F (36.4 C)    TempSrc:   Oral Oral  Resp:    20  Height:      Weight:   40.3 kg (88 lb 13.5 oz)   SpO2: 96%  98% 98%    Intake/Output Summary (Last 24 hours) at 05/18/15 1752 Last data filed at 05/18/15 1130  Gross per 24 hour  Intake 251.63 ml  Output    701 ml  Net -449.37 ml   Weight change: 0.837 kg (1 lb 13.5 oz) Exam:   General:  Pt is alert, follows commands appropriately, not in acute distress  HEENT: No icterus, No thrush, No neck mass, [place]/AT  Cardiovascular: RRR, S1/S2, no rubs, no gallops  Respiratory:fine bibasilar crackles. No wheezing.   Abdomen: Soft/+BS, non tender, non distended, no guarding; no hepatosplenomegaly   Extremities: No edema, No lymphangitis, No petechiae, No rashes, no synovitis  Data Reviewed: Basic Metabolic Panel:  Recent Labs Lab 05/14/15 0356 05/14/15 1151 05/15/15 0501 05/16/15 0448 05/17/15 0530 05/18/15 0300  NA 137  --  138 138 139 143  K 5.5*  --  5.1 4.7 4.9 4.7  CL 106  --  105 106 106 108  CO2 19*  --  18* 19* 23 24  GLUCOSE 121*  --  110* 112* 120* 104*  BUN 28*  --  53* 69* 64* 53*  CREATININE 1.68*  --  3.17* 3.19* 2.67* 2.07*  CALCIUM 8.9  --  8.8* 8.3* 9.1 9.3  MG  --  2.2  --   --   --   --    Liver Function Tests:  Recent Labs Lab 05/14/15 0356  AST 85*  ALT 74*  ALKPHOS 158*  BILITOT 1.0  PROT 7.9  ALBUMIN 4.3   No results for input(s): LIPASE, AMYLASE in the last 168 hours.  Recent Labs Lab 05/16/15 1125  AMMONIA 40*  CBC:  Recent Labs Lab 05/14/15 0356 05/16/15 1125  WBC 9.6  --   NEUTROABS 7.4  --   HGB 10.4*  --   HCT 30.7* 27.5*  MCV 95.0  --   PLT 350  --    Cardiac Enzymes:  Recent Labs Lab 05/14/15 0609 05/14/15 1151 05/14/15 1745 05/14/15 2337  TROPONINI 0.08* 0.08* 0.08* 0.08*   BNP: Invalid input(s): POCBNP CBG:  Recent Labs Lab 05/14/15 0210  GLUCAP 116*    Recent Results (from the past 240 hour(s))  Urine culture     Status: None   Collection Time: 05/14/15   3:26 AM  Result Value Ref Range Status   Specimen Description URINE, CLEAN CATCH  Final   Special Requests NONE  Final   Culture MULTIPLE SPECIES PRESENT, SUGGEST RECOLLECTION  Final   Report Status 05/15/2015 FINAL  Final  Urine culture     Status: None   Collection Time: 05/16/15  4:05 PM  Result Value Ref Range Status   Specimen Description URINE, CLEAN CATCH  Final   Special Requests NONE  Final   Culture MULTIPLE SPECIES PRESENT, SUGGEST RECOLLECTION  Final   Report Status 05/18/2015 FINAL  Final     Scheduled Meds: . allopurinol  200 mg Oral Daily  . aspirin  325 mg Oral q morning - 10a  . calcitRIOL  0.25 mcg Oral Daily  . cyclobenzaprine  5 mg Oral QHS  . feeding supplement (ENSURE ENLIVE)  237 mL Oral BID BM  . heparin  5,000 Units Subcutaneous 3 times per day  . hydrALAZINE  25 mg Oral Q12H  . isosorbide mononitrate  15 mg Oral BID  . lacosamide  150 mg Oral BID  . lactulose  20 g Oral BID  . levETIRAcetam  500 mg Oral QHS  . sodium chloride flush  10-40 mL Intracatheter Q12H  . sodium chloride flush  3 mL Intravenous Q12H  . sodium chloride flush  3 mL Intravenous Q12H   Continuous Infusions: . milrinone 0.125 mcg/kg/min (05/17/15 2300)     Keelen Quevedo, DO  [facility] Pager [phone]  If 7PM-7AM, please contact night-coverage [email or web address] Password [email or web address] 05/18/2015, 5:52 PM   LOS: 4 days

## 2015-05-18 NOTE — Progress Notes (Signed)
Patient Name: [name] Date of Encounter: 05/18/2015  Active Problems:   Hypertensive heart and kidney disease   Hyperkalemia   Protein-calorie malnutrition, severe (HCC)   Acute combined systolic and diastolic congestive heart failure (HCC)   Chest pain   AKI (acute kidney injury) (HCC)   Troponin level elevated   Acute systolic heart failure (HCC)   Acute on chronic combined systolic and diastolic CHF (congestive heart failure) (HCC)   Acute renal failure superimposed on stage 3 chronic kidney disease (HCC)   Acute encephalopathy   Hyperammonemia (HCC)   Primary Cardiologist: new, Dr Tenny Craw  Patient Profile: 75 yo female w/ hx HTN, CVA, CAD, CHF (managed by primary MD). Admitted 04/13 with weakness and CP. Worsening CHF, ARF, abnl LFTs.  SUBJECTIVE: Breathing better today, says dry weight 85-86 lbs (currently 88).  Has been up to BR. Tolerating milrinone well.   OBJECTIVE Filed Vitals:   05/18/15 0034 05/18/15 0102 05/18/15 0304 05/18/15 0517  BP: 128/85 134/85 114/75 116/74  Pulse: 102 101 92 93  Temp:    98 F (36.7 C)  TempSrc:    Oral  Resp:      Height:      Weight:    88 lb 13.5 oz (40.3 kg)  SpO2:  96%  98%    Intake/Output Summary (Last 24 hours) at 05/18/15 1000 Last data filed at 05/18/15 0925  Gross per 24 hour  Intake 491.63 ml  Output    801 ml  Net -309.37 ml   Filed Weights   05/16/15 0601 05/17/15 0448 05/18/15 0517  Weight: 86 lb 1.6 oz (39.055 kg) 87 lb (39.463 kg) 88 lb 13.5 oz (40.3 kg)    PHYSICAL EXAM General: Well developed, well nourished, female in no acute distress. Head: Normocephalic, atraumatic.  Neck: Supple without bruits, JVD to jaw. Lungs:  Resp regular and unlabored, CTA. Heart: RRR, S1, S2, + S3, - S4, 2/6 murmur; no rub. Abdomen: Soft, non-tender, non-distended, BS + x 4.  Extremities: No clubbing, cyanosis, edema.  Neuro: Alert and oriented X 3. Moves all extremities spontaneously. Psych: Normal  affect.  LABS: Basic Metabolic Panel: Recent Labs  05/17/15 0530 05/18/15 0300  NA 139 143  K 4.9 4.7  CL 106 108  CO2 23 24  GLUCOSE 120* 104*  BUN 64* 53*  CREATININE 2.67* 2.07*  CALCIUM 9.1 9.3  04/13 28/1.68 BNP:  B NATRIURETIC PEPTIDE  Date/Time Value Ref Range Status  05/14/2015 04:19 AM >4500.0* 0.0 - 100.0 pg/mL Final   Thyroid Function Tests: Recent Labs  05/16/15 1125  TSH 1.765   Anemia Panel: Recent Labs  05/16/15 1125  VITAMINB12 516    TELE:        ECHO: 05/15/2015  - Left ventricle: The cavity size was normal. Wall thickness was  increased in a pattern of mild LVH. Systolic function was  severely reduced. The estimated ejection fraction was in the  range of 25% to 30%. Doppler parameters are consistent with a  reversible restrictive pattern, indicative of decreased left  ventricular diastolic compliance and/or increased left atrial  pressure (grade 3 diastolic dysfunction). - Ventricular septum: The contour showed diastolic flattening. - Aortic valve: There was trivial regurgitation. - Mitral valve: There was mild regurgitation. - Right ventricle: The cavity size was severely dilated. Systolic  function was moderately to severely reduced. - Right atrium: The atrium was moderately dilated. - Tricuspid valve: There was wide-open regurgitation. Impressions: - The LV systolic and diastolic  function are severely reduced.  The TV has wide open TR.  The RV / RA gradient was measured only at 15 ( giving an  estimated PA pressure of 30)  The RV / PA pressure appears to be higher than that - the RV is  enlarged . There is flattening of the ventricular septum in  diastole.  Radiology/Studies: US Renal 05/16/2015  CLINICAL DATA:  75 year old female with acute on chronic renal failure EXAM: RENAL / URINARY TRACT ULTRASOUND COMPLETE COMPARISON:  Prior renal ultrasound 03/06/2014 FINDINGS: Right Kidney: Length: 8.4 cm. No evidence of  hydronephrosis. Significant echogenicity of the renal parenchyma with increased conspicuity of the corticomedullary interface. There is mild perinephric fluid consistent with perinephric sweat. Left Kidney: Length: 9.1 cm. No evidence of hydronephrosis. Significant echogenicity of the renal parenchyma with increased conspicuity of the corticomedullary interface. There is mild perinephric fluid consistent with perinephric sweat. Bladder: Appears normal for degree of bladder distention. IMPRESSION: 1. Significantly echogenic kidneys bilaterally consistent with medical renal disease. 2. Mild perinephric fluid (perinephric 'sweat' sign) bilaterally which can be seen in the setting of acute renal failure. 3. No evidence of hydronephrosis. Electronically Signed   By: Malachy Moan M.D.   On: 05/16/2015 15:42   Dg Abd 2 Views 05/17/2015  CLINICAL DATA:  Left lower quadrant pain for 3 days EXAM: ABDOMEN - 2 VIEW COMPARISON:  05/12/2015 FINDINGS: The stomach is distended with air and ingested material. Scattered large and small bowel gas is noted without obstructive change. No free air is seen. No abnormal mass or abnormal calcifications are noted. There changes of prior L2 fracture stable in appearance. IMPRESSION: Somewhat distended stomach. No obstructive changes are seen.  No free air is noted. Electronically Signed   By: Alcide Clever M.D.   On: 05/17/2015 17:06  Dg Chest 2 View  05/12/2015  CLINICAL DATA:  Low back pain.  Posterior chest pain. EXAM: CHEST  2 VIEW COMPARISON:  03/08/2014 FINDINGS: Cardiac enlargement is similar to the prior study. Negative for heart failure. Small bilateral pleural effusions. Mild streaky density in both lung bases consistent with atelectasis or infiltrate. No mass lesion. Nipple shadow on the left noted Severe compression fracture mid thoracic spine of indeterminate age. Most recent lateral view was 02/25/2013 and the fracture was not present at that time IMPRESSION: Cardiac  enlargement with small pleural effusions. Negative for heart failure Mild bibasilar atelectasis/infiltrate. Severe mid thoracic compression fracture indeterminate age. Electronically Signed   By: Marlan Palau M.D.   On: 05/12/2015 15:41    Current Medications:  . allopurinol  200 mg Oral Daily  . aspirin  325 mg Oral q morning - 10a  . calcitRIOL  0.25 mcg Oral Daily  . cyclobenzaprine  5 mg Oral QHS  . feeding supplement (ENSURE ENLIVE)  237 mL Oral BID BM  . heparin  5,000 Units Subcutaneous 3 times per day  . lacosamide  150 mg Oral BID  . lactulose  20 g Oral BID  . levETIRAcetam  500 mg Oral QHS  . sodium chloride flush  10-40 mL Intracatheter Q12H  . sodium chloride flush  3 mL Intravenous Q12H  . sodium chloride flush  3 mL Intravenous Q12H   . milrinone 0.125 mcg/kg/min (05/17/15 2300)    ASSESSMENT AND PLAN:  1.  Acute on chronic combined systolic and diastolic CHF (congestive heart failure) (HCC) - echo results above. - got 2 doses on Lasix 40 mg IV on 04/13, none since due to BUN/Cr bump -  IV Milrinone started last pm - Coox was 42.3 last pm, will recheck, but hold off on add'l Lasix for now  Otherwise, per IM Active Problems:   Hypertensive heart and kidney disease   Hyperkalemia   Protein-calorie malnutrition, severe (HCC)   Acute combined systolic and diastolic congestive heart failure (HCC)   Chest pain   AKI (acute kidney injury) (HCC)   Troponin level elevated   Acute systolic heart failure (HCC)   Acute renal failure superimposed on stage 3 chronic kidney disease (HCC)   Acute encephalopathy   Hyperammonemia (HCC)   Signed, Barrett, Rhonda , PA-C 10:00 AM 05/18/2015 The patient has been seen in conjunction with Theodore Demark, PA-C. All aspects of care have been considered and discussed. The patient has been personally interviewed, examined, and all clinical data has been reviewed.   Acute on chronic systolic heart failure with low output state.  Mild diuresis led to acute worsening in kidney function. She also developed confusion and decreased mentation. Diuretics have been held, kidney function is improving after addition of milrinone.  The patient states that her reason for coming to the hospital was lower back and left flank discomfort. She denied dyspnea but was having some left lateral chest discomfort.  The patient was admitted taking furosemide 40 mg daily and carvedilol.  Loud S3 gallop on exam and marked elevated jugular veins.  Echo demonstrates biventricular failure with LVEF 25-30% and also dilated and hypocontractile right ventricle.  Overall, the patient has biventricular heart failure, low output state with Co-Ox of 43% prior to starting milrinone.  Plan continue milrinone, institute vasodilator therapy, avoid ACE inhibitor therapy with kidney function altered. We need to wean and DC the milrinone. We will do this over the next day or 2. May need a vast heart failure team consult.

## 2015-05-19 ENCOUNTER — Inpatient Hospital Stay (HOSPITAL_COMMUNITY): Payer: Commercial Managed Care - HMO

## 2015-05-19 DIAGNOSIS — E875 Hyperkalemia: Secondary | ICD-10-CM

## 2015-05-19 DIAGNOSIS — G9340 Encephalopathy, unspecified: Secondary | ICD-10-CM

## 2015-05-19 LAB — BASIC METABOLIC PANEL
Anion gap: 10 (ref 5–15)
BUN: 35 mg/dL — ABNORMAL HIGH (ref 6–20)
CO2: 19 mmol/L — ABNORMAL LOW (ref 22–32)
Calcium: 9.4 mg/dL (ref 8.9–10.3)
Chloride: 111 mmol/L (ref 101–111)
Creatinine, Ser: 1.58 mg/dL — ABNORMAL HIGH (ref 0.44–1.00)
GFR calc Af Amer: 37 mL/min — ABNORMAL LOW (ref >60)
GFR calc non Af Amer: 32 mL/min — ABNORMAL LOW (ref >60)
Glucose, Bld: 107 mg/dL — ABNORMAL HIGH (ref 65–99)
Potassium: 4.9 mmol/L (ref 3.5–5.1)
Sodium: 140 mmol/L (ref 135–145)

## 2015-05-19 LAB — MRSA PCR SCREENING: MRSA by PCR: NEGATIVE

## 2015-05-19 LAB — CARBOXYHEMOGLOBIN
Carboxyhemoglobin: 1.7 % — ABNORMAL HIGH (ref 0.5–1.5)
Methemoglobin: 0.5 % (ref 0.0–1.5)
O2 Saturation: 98.7 %
Total hemoglobin: 8.6 g/dL — ABNORMAL LOW (ref 12.0–16.0)

## 2015-05-19 LAB — AMMONIA: Ammonia: 24 umol/L (ref 9–35)

## 2015-05-19 MED ORDER — DEXTROSE 5 % IV SOLN
500.0000 mg | INTRAVENOUS | Status: DC
  Administered 2015-05-20 – 2015-05-21 (×2): 500 mg via INTRAVENOUS
  Filled 2015-05-19 (×3): qty 0.5

## 2015-05-19 MED ORDER — VANCOMYCIN HCL 500 MG IV SOLR
500.0000 mg | INTRAVENOUS | Status: DC
  Administered 2015-05-20: 500 mg via INTRAVENOUS
  Filled 2015-05-19 (×2): qty 500

## 2015-05-19 MED ORDER — FUROSEMIDE 20 MG PO TABS
20.0000 mg | ORAL_TABLET | Freq: Every day | ORAL | Status: DC
  Administered 2015-05-19 – 2015-05-21 (×3): 20 mg via ORAL
  Filled 2015-05-19 (×3): qty 1

## 2015-05-19 MED ORDER — CARVEDILOL 3.125 MG PO TABS
3.1250 mg | ORAL_TABLET | Freq: Two times a day (BID) | ORAL | Status: DC
  Administered 2015-05-19 – 2015-05-20 (×2): 3.125 mg via ORAL
  Filled 2015-05-19 (×2): qty 1

## 2015-05-19 MED ORDER — DEXTROSE 5 % IV SOLN
1.0000 g | Freq: Once | INTRAVENOUS | Status: AC
  Administered 2015-05-19: 1 g via INTRAVENOUS
  Filled 2015-05-19: qty 1

## 2015-05-19 MED ORDER — ISOSORB DINITRATE-HYDRALAZINE 20-37.5 MG PO TABS
1.0000 | ORAL_TABLET | Freq: Two times a day (BID) | ORAL | Status: DC
  Administered 2015-05-19 – 2015-05-20 (×2): 1 via ORAL
  Filled 2015-05-19 (×2): qty 1

## 2015-05-19 MED ORDER — DIPHENHYDRAMINE HCL 25 MG PO CAPS
25.0000 mg | ORAL_CAPSULE | Freq: Once | ORAL | Status: AC
Start: 2015-05-19 — End: 2015-05-19
  Administered 2015-05-19: 25 mg via ORAL
  Filled 2015-05-19: qty 1

## 2015-05-19 MED ORDER — VANCOMYCIN HCL IN DEXTROSE 750-5 MG/150ML-% IV SOLN
750.0000 mg | Freq: Once | INTRAVENOUS | Status: AC
  Administered 2015-05-19: 750 mg via INTRAVENOUS
  Filled 2015-05-19: qty 150

## 2015-05-19 MED ORDER — ASPIRIN 81 MG PO CHEW
81.0000 mg | CHEWABLE_TABLET | Freq: Every morning | ORAL | Status: DC
  Administered 2015-05-20 – 2015-05-23 (×4): 81 mg via ORAL
  Filled 2015-05-19 (×4): qty 1

## 2015-05-19 MED ORDER — GUAIFENESIN 100 MG/5ML PO SOLN
5.0000 mL | ORAL | Status: DC | PRN
  Administered 2015-05-19 (×2): 100 mg via ORAL
  Filled 2015-05-19 (×2): qty 10

## 2015-05-19 NOTE — Clinical Documentation Improvement (Signed)
Cardiology Internal Medicine  Can the diagnosis of acute encephalopathy be further specified in progress notes and discharge summary?   Metabolic encephalopathy  Other  Clinically Undetermined  Document any associated diagnoses/conditions.   Supporting Information:  75 year old female admitted with acute on chronic CHF  4/15 progress note Acute encephalopathy -multifactorial including CHF, renal failure, hospital delirium -check for reversible causes -TSH -Serum B12 -ammonia -UA  4/16 & 4/17 progress note: Acute encephalopathy -multifactorial including CHF, renal failure, hospital delirium -check for reversible causes -TSH--1.765 -Serum B12--516 -ammonia--40 -UA--no pyuria  4/18 cards progress note SUBJECTIVE: Pt up all night, family was with her. She thought she was at home, did not always recognize family. Walked a lot.   Treatments: Monitoring labs  Please exercise your independent, professional judgment when responding. A specific answer is not anticipated or expected.   Thank You,  Harless Litten [facility] Information Management [place] [phone]

## 2015-05-19 NOTE — Care Management Important Message (Signed)
Important Message  Patient Details  Name: [name] MRN: [id number] Date of Birth: 12-30-1940   Medicare Important Message Given:  Yes    Haskell Flirt 05/19/2015, 9:24 AMImportant Message  Patient Details  Name: [name] MRN: [id number] Date of Birth: 1940/09/08   Medicare Important Message Given:  Yes    Haskell Flirt 05/19/2015, 9:24 AM

## 2015-05-19 NOTE — Progress Notes (Signed)
PROGRESS NOTE  [name] EOF:[id number] DOB: [birth year] DOA: 05/14/2015 PCP: [name], MD  Brief History 75 year old female who presents with 1-2 week history of shortness of breath and dyspnea on exertion with associated dizziness. The patient states that in the past 3 days she has had a worsening of her symptoms with intermittent chest discomfort. She denies any fevers, chills, nausea, vomiting, diarrhea, syncope, mechanical falls. She states that her primary care physician recently contacted her and told her to stop her furosemide. She has been off of for furosemide for slightly over one week prior to this admission. Initial workup revealed that the patient had small bilateral pleural effusions and decompensated CHF. The patient was started on intravenous furosemide with good clinical improvement.  Furosemide had been stopped due to significant elevation of her serum creatinine. Cardiology was consulted due to worsening echocardiographic findings with EF 25-30 percent and wide open TR. The patient was subsequently placed on a milrinone drip by cardiology.  Assessment/Plan: Acute on chronic systolic and diastolic CHF -good clinical improvement with IV lasix -hold furosemide after 24 hours due to increased Scr and monitor clinically-->1.68--->3.17 -05/15/15--Echocardiogram--EF 25-30%, grade 3 DD, wide open TR, mod-severe RV dysfxn - TR contributing to patient's JVD -Daily weights--NEG 3 lbs since admission -Continue carvedilol; BiDil started by cardiology -4/18--start po lasix -Unable to use ACE inhibitor secondary to renal insufficiency -due to worsen Echo findings compared to 12/29/12--consult cardiology -appreciate cardiology--> started milrinone 05/17/2015-->improved renal fxn -Patient felt appreciably better since starting milrinone--plan to wean off 4/19  Elevated troponin -Likely due to decompensated CHF -No chest pain presently -Trend is flat  Acute on  chronic renal failure--CKD stage III -Baseline creatinine 1.2-1.6 -Due to diuresis and biventricular failure -dc colchicine for now  Acute metabolic encephalopathy -multifactorial including cardiomyopathy (low flow state), renal failure, hospital delirium and elevated ammonia -check for reversible causes -TSH--1.765 -Serum B12--516 -ammonia--40-->24 with lactulose -UA--no pyuria -has shown some improvement, but has intermittent delirium -suspect she has underlying cognitive impairment  Hyperammonemia -trial of lactulose to see if mentation improves  -secondary to chronic hepatic congestion from R-heart failure although pt had hx of Etoh -05/17/2015 AXR--distended stomach, no obstruction, no free air -ammonia 40-->24 -continue lactulose  Severe protein calorie malnutrition -Continue measures for supplementation  Hyperkalemia -Improved with diuresis  Seizure disorder -Continue Vimpat and Keppra  History of stroke -Continue aspirin  Family Communication: Son updated on phone 05/19/2015--total time 35 min; >50% spent counseling and coordinating care Disposition Plan: Not stable for d/c--on milrinone      Procedures/Studies: Dg Chest 2 View  05/12/2015  CLINICAL DATA:  Low back pain.  Posterior chest pain. EXAM: CHEST  2 VIEW COMPARISON:  03/08/2014 FINDINGS: Cardiac enlargement is similar to the prior study. Negative for heart failure. Small bilateral pleural effusions. Mild streaky density in both lung bases consistent with atelectasis or infiltrate. No mass lesion. Nipple shadow on the left noted Severe compression fracture mid thoracic spine of indeterminate age. Most recent lateral view was 02/25/2013 and the fracture was not present at that time IMPRESSION: Cardiac enlargement with small pleural effusions. Negative for heart failure Mild bibasilar atelectasis/infiltrate. Severe mid thoracic compression fracture indeterminate age. Electronically Signed   By: Marlan Palau M.D.   On: 05/12/2015 15:41   Dg Lumbar Spine 2-3 Views  05/12/2015  CLINICAL DATA:  Low back pain EXAM: LUMBAR SPINE - 2-3 VIEW COMPARISON:  02/19/2013 FINDINGS: Moderate compression fracture L2 is chronic and  unchanged. Mild to moderate levoscoliosis unchanged. No new fracture. L5 is partially sacralized. Generalized osteopenia. No significant disc degeneration. 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 No acute abnormality. Electronically Signed   By: Marlan Palau M.D.   On: 05/12/2015 15:32   US Renal  05/16/2015  CLINICAL DATA:  75 year old female with acute on chronic renal failure EXAM: RENAL / URINARY TRACT ULTRASOUND COMPLETE COMPARISON:  Prior renal ultrasound 03/06/2014 FINDINGS: Right Kidney: Length: 8.4 cm. No evidence of hydronephrosis. Significant echogenicity of the renal parenchyma with increased conspicuity of the corticomedullary interface. There is mild perinephric fluid consistent with perinephric sweat. Left Kidney: Length: 9.1 cm. No evidence of hydronephrosis. Significant echogenicity of the renal parenchyma with increased conspicuity of the corticomedullary interface. There is mild perinephric fluid consistent with perinephric sweat. Bladder: Appears normal for degree of bladder distention. IMPRESSION: 1. Significantly echogenic kidneys bilaterally consistent with medical renal disease. 2. Mild perinephric fluid (perinephric 'sweat' sign) bilaterally which can be seen in the setting of acute renal failure. 3. No evidence of hydronephrosis. Electronically Signed   By: Malachy Moan M.D.   On: 05/16/2015 15:42   Dg Abd 2 Views  05/17/2015  CLINICAL DATA:  Left lower quadrant pain for 3 days EXAM: ABDOMEN - 2 VIEW COMPARISON:  05/12/2015 FINDINGS: The stomach is distended with air and ingested material. Scattered large and small bowel gas is noted without obstructive change. No free air is seen. No abnormal mass or abnormal calcifications are noted. There  changes of prior L2 fracture stable in appearance. IMPRESSION: Somewhat distended stomach. No obstructive changes are seen.  No free air is noted. Electronically Signed   By: Alcide Clever M.D.   On: 05/17/2015 17:06         Subjective: Patient complains of nonproductive cough. Denies any fevers, chills, chest pain, nausea, vomiting, diarrhea. Had 2 bowel movements today.  Objective: Filed Vitals:   05/18/15 2053 05/18/15 2130 05/19/15 0439 05/19/15 0836  BP: 146/80 158/98 128/83 134/81  Pulse:  115 116   Temp:  98.3 F (36.8 C) 98.1 F (36.7 C)   TempSrc:  Oral Oral   Resp:  20 20   Height:      Weight:   38 kg (83 lb 12.4 oz)   SpO2:  100% 97%     Intake/Output Summary (Last 24 hours) at 05/19/15 1643 Last data filed at 05/19/15 0400  Gross per 24 hour  Intake    270 ml  Output    226 ml  Net     44 ml   Weight change: -2.3 kg (-5 lb 1.1 oz) Exam:   General:  Pt is alert, follows commands appropriately, not in acute distress  HEENT: No icterus, No thrush, No neck mass, [place]/AT  Cardiovascular: RRR, S1/S2, no rubs, no gallops  Respiratory: Bibasilar rales. Good air movement.  Abdomen: Soft/+BS, non tender, non distended, no guarding; no hepatosplenomegaly  Extremities: No edema, No lymphangitis, No petechiae, No rashes, no synovitis  Data Reviewed: Basic Metabolic Panel:  Recent Labs Lab 05/14/15 1151 05/15/15 0501 05/16/15 0448 05/17/15 0530 05/18/15 0300 05/19/15 0518  NA  --  138 138 139 143 140  K  --  5.1 4.7 4.9 4.7 4.9  CL  --  105 106 106 108 111  CO2  --  18* 19* 23 24 19*  GLUCOSE  --  110* 112* 120* 104* 107*  BUN  --  53* 69* 64* 53* 35*  CREATININE  --  3.17* 3.19* 2.67* 2.07* 1.58*  CALCIUM  --  8.8* 8.3* 9.1 9.3 9.4  MG 2.2  --   --   --   --   --    Liver Function Tests:  Recent Labs Lab 05/14/15 0356  AST 85*  ALT 74*  ALKPHOS 158*  BILITOT 1.0  PROT 7.9  ALBUMIN 4.3   No results for input(s): LIPASE, AMYLASE in the last  168 hours.  Recent Labs Lab 05/16/15 1125 05/19/15 0720  AMMONIA 40* 24   CBC:  Recent Labs Lab 05/14/15 0356 05/16/15 1125  WBC 9.6  --   NEUTROABS 7.4  --   HGB 10.4*  --   HCT 30.7* 27.5*  MCV 95.0  --   PLT 350  --    Cardiac Enzymes:  Recent Labs Lab 05/14/15 0609 05/14/15 1151 05/14/15 1745 05/14/15 2337  TROPONINI 0.08* 0.08* 0.08* 0.08*   BNP: Invalid input(s): POCBNP CBG:  Recent Labs Lab 05/14/15 0210  GLUCAP 116*    Recent Results (from the past 240 hour(s))  Urine culture     Status: None   Collection Time: 05/14/15  3:26 AM  Result Value Ref Range Status   Specimen Description URINE, CLEAN CATCH  Final   Special Requests NONE  Final   Culture MULTIPLE SPECIES PRESENT, SUGGEST RECOLLECTION  Final   Report Status 05/15/2015 FINAL  Final  Urine culture     Status: None   Collection Time: 05/16/15  4:05 PM  Result Value Ref Range Status   Specimen Description URINE, CLEAN CATCH  Final   Special Requests NONE  Final   Culture MULTIPLE SPECIES PRESENT, SUGGEST RECOLLECTION  Final   Report Status 05/18/2015 FINAL  Final     Scheduled Meds: . allopurinol  200 mg Oral Daily  . [START ON 05/20/2015] aspirin  81 mg Oral q morning - 10a  . calcitRIOL  0.25 mcg Oral Daily  . carvedilol  3.125 mg Oral BID WC  . cyclobenzaprine  5 mg Oral QHS  . feeding supplement (ENSURE ENLIVE)  237 mL Oral BID BM  . furosemide  20 mg Oral Daily  . heparin  5,000 Units Subcutaneous 3 times per day  . isosorbide-hydrALAZINE  1 tablet Oral BID  . lacosamide  150 mg Oral BID  . lactulose  20 g Oral BID  . levETIRAcetam  500 mg Oral QHS  . sodium chloride flush  10-40 mL Intracatheter Q12H  . sodium chloride flush  3 mL Intravenous Q12H  . sodium chloride flush  3 mL Intravenous Q12H   Continuous Infusions: . milrinone 0.125 mcg/kg/min (05/18/15 1500)     Nima Kemppainen, DO  [facility] Pager [phone]  If 7PM-7AM, please contact  night-coverage [email or web address] Password [facility] 05/19/2015, 4:43 PM   LOS: 5 days

## 2015-05-19 NOTE — Progress Notes (Addendum)
Patient Name: [name] Date of Encounter: 05/19/2015  Active Problems:   Hypertensive heart and kidney disease   Hyperkalemia   Protein-calorie malnutrition, severe (HCC)   Acute combined systolic and diastolic congestive heart failure (HCC)   Chest pain   AKI (acute kidney injury) (HCC)   Troponin level elevated   Acute systolic heart failure (HCC)   Acute on chronic combined systolic and diastolic CHF (congestive heart failure) (HCC)   Acute renal failure superimposed on stage 3 chronic kidney disease (HCC)   Acute encephalopathy   Hyperammonemia (HCC)   Primary Cardiologist: new, Dr Tenny Craw  Patient Profile: 75 yo female w/ hx HTN, CVA, CAD, CHF (managed by primary MD). Admitted 04/13 with weakness and CP. Worsening CHF, ARF, abnl LFTs.  SUBJECTIVE: Pt up all night, family was with her. She thought she was at home, did not always recognize family. Walked a lot.   Pt currently c/o dry cough, says feels a little tired but not SOB except due to cough.  OBJECTIVE Filed Vitals:   05/18/15 2053 05/18/15 2130 05/19/15 0439 05/19/15 0836  BP: 146/80 158/98 128/83 134/81  Pulse:  115 116   Temp:  98.3 F (36.8 C) 98.1 F (36.7 C)   TempSrc:  Oral Oral   Resp:  20 20   Height:      Weight:   83 lb 12.4 oz (38 kg)   SpO2:  100% 97%     Intake/Output Summary (Last 24 hours) at 05/19/15 0920 Last data filed at 05/19/15 0400  Gross per 24 hour  Intake 402.38 ml  Output    426 ml  Net -23.62 ml   Filed Weights   05/17/15 0448 05/18/15 0517 05/19/15 0439  Weight: 87 lb (39.463 kg) 88 lb 13.5 oz (40.3 kg) 83 lb 12.4 oz (38 kg)    PHYSICAL EXAM General: Well developed, well nourished, female in no acute distress. Head: Normocephalic, atraumatic.  Neck: Supple without bruits, JVD 12 cm. Lungs:  Resp regular and unlabored, decreased BS bases, few rales. Heart: RRR, S1, S2, no S3, S4, 2/6 murmur; no rub. Abdomen: Soft, non-tender, non-distended, BS + x 4.    Extremities: No clubbing, cyanosis, edema.  Neuro: Alert and oriented X 2. Moves all extremities spontaneously.   LABS: CBC: Recent Labs  05/16/15 1125  HCT 27.5*   Basic Metabolic Panel: Recent Labs  05/18/15 0300 05/19/15 0518  NA 143 140  K 4.7 4.9  CL 108 111  CO2 24 19*  GLUCOSE 104* 107*  BUN 53* 35*  CREATININE 2.07* 1.58*  CALCIUM 9.3 9.4   BNP:  B NATRIURETIC PEPTIDE  Date/Time Value Ref Range Status  05/14/2015 04:19 AM >4500.0* 0.0 - 100.0 pg/mL Final   Thyroid Function Tests: Recent Labs  05/16/15 1125  TSH 1.765   Anemia Panel: Recent Labs  05/16/15 1125  VITAMINB12 516    TELE:  Generally ST      Radiology/Studies: Dg Abd 2 Views 05/17/2015  CLINICAL DATA:  Left lower quadrant pain for 3 days EXAM: ABDOMEN - 2 VIEW COMPARISON:  05/12/2015 FINDINGS: The stomach is distended with air and ingested material. Scattered large and small bowel gas is noted without obstructive change. No free air is seen. No abnormal mass or abnormal calcifications are noted. There changes of prior L2 fracture stable in appearance. IMPRESSION: Somewhat distended stomach. No obstructive changes are seen.  No free air is noted. Electronically Signed   By: Eulah Pont.D.  On: 05/17/2015 17:06     Current Medications:  . allopurinol  200 mg Oral Daily  . aspirin  325 mg Oral q morning - 10a  . calcitRIOL  0.25 mcg Oral Daily  . cyclobenzaprine  5 mg Oral QHS  . feeding supplement (ENSURE ENLIVE)  237 mL Oral BID BM  . heparin  5,000 Units Subcutaneous 3 times per day  . hydrALAZINE  25 mg Oral Q12H  . isosorbide mononitrate  15 mg Oral BID  . lacosamide  150 mg Oral BID  . lactulose  20 g Oral BID  . levETIRAcetam  500 mg Oral QHS  . sodium chloride flush  10-40 mL Intracatheter Q12H  . sodium chloride flush  3 mL Intravenous Q12H  . sodium chloride flush  3 mL Intravenous Q12H   . milrinone 0.125 mcg/kg/min (05/18/15 1500)    ASSESSMENT AND PLAN:  1.  Acute on chronic combined systolic and diastolic CHF (congestive heart failure) (HCC) - echo with severe biventricular failure. - got 2 doses on Lasix 40 mg IV on 04/13, none since due to BUN/Cr bump - IV Milrinone started 04/16, BUN/Cr improving, however, pt tachycardic - dose is low, think could d/c without further weaning - Coox was 42.3 on 04/16; 98.7 on 04/18 - MD advise on restarting PO Lasix.  Otherwise, per IM Active Problems:   Hypertensive heart and kidney disease   Hyperkalemia   Protein-calorie malnutrition, severe (HCC)   Acute combined systolic and diastolic congestive heart failure (HCC)   Chest pain   AKI (acute kidney injury) (HCC)   Troponin level elevated   Acute systolic heart failure (HCC)   Acute on chronic combined systolic and diastolic CHF (congestive heart failure) (HCC)   Acute renal failure superimposed on stage 3 chronic kidney disease (HCC)   Acute encephalopathy   Hyperammonemia (HCC)   Signed, Barrett, Rhonda , PA-C 9:20 AM 05/19/2015 The patient has been seen in conjunction with Theodore Demark, PA-C. All aspects of care have been considered and discussed. The patient has been personally interviewed, examined, and all clinical data has been reviewed.   There is improving renal function on inotropic therapy.  Heart failure therapy is being slowly reinstituted.  Heart rate is elevated due to beta blocker withdrawal. We'll resume a lower dose of carvedilol.  Start BiDil and monitor blood pressure.  Will discontinue milrinone this evening or in a.m. depending upon blood pressure. Will reinstitute low-dose diuretic therapy orally today.  Prognosis is poor. Discussed with the patient and family.

## 2015-05-19 NOTE — Consult Note (Signed)
   [facility] CM Inpatient Consult   05/19/2015  [name] [birth year] [id number]   Patient screened for [facility] Care Management program. Micah Flesher to bedside to speak with friend and patient. Advised to speak with son, Jaynie Collins. Spoke with Lamont at [phone]. Son states the plan is for patient to go to rehab and not home at this time. Will make inpatient RNCM and inpatient Licensed CSW aware of conversation with son. Will continue to follow along and re-engage for [facility] Care Management services if appropriate.   Raiford Noble, MSN-Ed, RN,BSN [facility] Liaison [phone]

## 2015-05-19 NOTE — Progress Notes (Signed)
Pharmacy Antibiotic Note  [name] is a 75 y.o. female admitted on 05/14/2015 with acute on chronic systolic and diastolic CHF. CXR obtained 05/19/2015 due to worsening cough and shortness of breath which was concerning for pneumonia. Pharmacy has been consulted for vancomycin and cefepime dosing for HAP.  Plan: Vancomycin 750mg  IV x 1, then 500mg  IV every 24 hours.  Goal trough 15-20 mcg/mL.  Cefepime 1g IV x 1, then 500mg  IV q24h Monitor renal function & adjust doses as needed Follow up cultures and de-escalate as appropriate  Height: 5\' 2"  (157.5 cm) Weight: 83 lb 12.4 oz (38 kg) IBW/kg (Calculated) : 50.1  Temp (24hrs), Avg:98.2 F (36.8 C), Min:98.1 F (36.7 C), Max:98.3 F (36.8 C)   Recent Labs Lab 05/14/15 0356 05/15/15 0501 05/16/15 0448 05/17/15 0530 05/18/15 0300 05/19/15 0518  WBC 9.6  --   --   --   --   --   CREATININE 1.68* 3.17* 3.19* 2.67* 2.07* 1.58*    Estimated Creatinine Clearance: 19.3 mL/min (by C-G formula based on Cr of 1.58).    No Known Allergies  Antimicrobials this admission: 4/13 Rocephin/Zithro x 1 dose 4/18 >> Vancomycin >> 4/18 >> Cefepime >>  Dose adjustments this admission: ---  Microbiology results: 4/13 UCx: multiple sepcies 4/15 UCx: multiple species 4/18 BCx: sent 4/18 MRSA PCR: sent  Thank you for allowing pharmacy to be a part of this patient's care.  Loralee Pacas, PharmD, BCPS Pager: [phone] 05/19/2015 8:00 PM

## 2015-05-19 NOTE — Progress Notes (Signed)
CXR ordered this afternoon due to worsen cough and sob again.  CXR shows Left lung worsen nodular opacities with consolidation.   Blood cultures x2.  Start vanc/cefepime for HAP.  MRSA screen.  DTat

## 2015-05-20 DIAGNOSIS — I429 Cardiomyopathy, unspecified: Secondary | ICD-10-CM

## 2015-05-20 DIAGNOSIS — D649 Anemia, unspecified: Secondary | ICD-10-CM | POA: Diagnosis present

## 2015-05-20 DIAGNOSIS — J189 Pneumonia, unspecified organism: Secondary | ICD-10-CM

## 2015-05-20 DIAGNOSIS — G9341 Metabolic encephalopathy: Secondary | ICD-10-CM

## 2015-05-20 DIAGNOSIS — I428 Other cardiomyopathies: Secondary | ICD-10-CM

## 2015-05-20 DIAGNOSIS — I5189 Other ill-defined heart diseases: Secondary | ICD-10-CM | POA: Diagnosis present

## 2015-05-20 DIAGNOSIS — E43 Unspecified severe protein-calorie malnutrition: Secondary | ICD-10-CM

## 2015-05-20 DIAGNOSIS — IMO0001 Reserved for inherently not codable concepts without codable children: Secondary | ICD-10-CM

## 2015-05-20 DIAGNOSIS — R569 Unspecified convulsions: Secondary | ICD-10-CM

## 2015-05-20 DIAGNOSIS — N183 Chronic kidney disease, stage 3 (moderate): Secondary | ICD-10-CM

## 2015-05-20 DIAGNOSIS — Z0389 Encounter for observation for other suspected diseases and conditions ruled out: Secondary | ICD-10-CM

## 2015-05-20 DIAGNOSIS — N179 Acute kidney failure, unspecified: Secondary | ICD-10-CM | POA: Insufficient documentation

## 2015-05-20 DIAGNOSIS — N289 Disorder of kidney and ureter, unspecified: Secondary | ICD-10-CM

## 2015-05-20 DIAGNOSIS — N189 Chronic kidney disease, unspecified: Secondary | ICD-10-CM

## 2015-05-20 DIAGNOSIS — R7989 Other specified abnormal findings of blood chemistry: Secondary | ICD-10-CM

## 2015-05-20 HISTORY — DX: Anemia, unspecified: D64.9

## 2015-05-20 HISTORY — DX: Other ill-defined heart diseases: I51.89

## 2015-05-20 HISTORY — DX: Pneumonia, unspecified organism: J18.9

## 2015-05-20 HISTORY — DX: Other cardiomyopathies: I42.8

## 2015-05-20 LAB — CBC
HCT: 25.5 % — ABNORMAL LOW (ref 36.0–46.0)
Hemoglobin: 8.8 g/dL — ABNORMAL LOW (ref 12.0–15.0)
MCH: 33.5 pg (ref 26.0–34.0)
MCHC: 34.5 g/dL (ref 30.0–36.0)
MCV: 97 fL (ref 78.0–100.0)
Platelets: 315 10*3/uL (ref 150–400)
RBC: 2.63 MIL/uL — ABNORMAL LOW (ref 3.87–5.11)
RDW: 19.8 % — ABNORMAL HIGH (ref 11.5–15.5)
WBC: 10.1 10*3/uL (ref 4.0–10.5)

## 2015-05-20 LAB — BASIC METABOLIC PANEL
Anion gap: 11 (ref 5–15)
BUN: 28 mg/dL — ABNORMAL HIGH (ref 6–20)
CO2: 19 mmol/L — ABNORMAL LOW (ref 22–32)
Calcium: 9.4 mg/dL (ref 8.9–10.3)
Chloride: 108 mmol/L (ref 101–111)
Creatinine, Ser: 1.53 mg/dL — ABNORMAL HIGH (ref 0.44–1.00)
GFR calc Af Amer: 38 mL/min — ABNORMAL LOW (ref >60)
GFR calc non Af Amer: 33 mL/min — ABNORMAL LOW (ref >60)
Glucose, Bld: 119 mg/dL — ABNORMAL HIGH (ref 65–99)
Potassium: 4.8 mmol/L (ref 3.5–5.1)
Sodium: 138 mmol/L (ref 135–145)

## 2015-05-20 LAB — CARBOXYHEMOGLOBIN
Carboxyhemoglobin: 1.2 % (ref 0.5–1.5)
Methemoglobin: 0.9 % (ref 0.0–1.5)
O2 Saturation: 43.2 %
Total hemoglobin: 8.7 g/dL — ABNORMAL LOW (ref 12.0–16.0)

## 2015-05-20 LAB — STREP PNEUMONIAE URINARY ANTIGEN: Strep Pneumo Urinary Antigen: NEGATIVE

## 2015-05-20 MED ORDER — CARVEDILOL 6.25 MG PO TABS
6.2500 mg | ORAL_TABLET | Freq: Two times a day (BID) | ORAL | Status: DC
  Administered 2015-05-20 – 2015-05-23 (×7): 6.25 mg via ORAL
  Filled 2015-05-20 (×7): qty 1

## 2015-05-20 MED ORDER — LEVALBUTEROL HCL 0.63 MG/3ML IN NEBU: 0.6300 mg | INHALATION_SOLUTION | RESPIRATORY_TRACT | Status: DC | PRN

## 2015-05-20 MED ORDER — BUDESONIDE 0.25 MG/2ML IN SUSP
0.2500 mg | Freq: Two times a day (BID) | RESPIRATORY_TRACT | Status: DC
  Administered 2015-05-20 – 2015-05-23 (×6): 0.25 mg via RESPIRATORY_TRACT
  Filled 2015-05-20 (×6): qty 2

## 2015-05-20 MED ORDER — LEVALBUTEROL HCL 0.63 MG/3ML IN NEBU
0.6300 mg | INHALATION_SOLUTION | Freq: Four times a day (QID) | RESPIRATORY_TRACT | Status: DC
  Administered 2015-05-20: 0.63 mg via RESPIRATORY_TRACT
  Filled 2015-05-20 (×2): qty 3

## 2015-05-20 MED ORDER — IPRATROPIUM BROMIDE 0.02 % IN SOLN: 0.5000 mg | RESPIRATORY_TRACT | Status: DC | PRN

## 2015-05-20 MED ORDER — ARFORMOTEROL TARTRATE 15 MCG/2ML IN NEBU
15.0000 ug | INHALATION_SOLUTION | Freq: Two times a day (BID) | RESPIRATORY_TRACT | Status: DC
  Administered 2015-05-20 – 2015-05-23 (×6): 15 ug via RESPIRATORY_TRACT
  Filled 2015-05-20 (×6): qty 2

## 2015-05-20 MED ORDER — GUAIFENESIN ER 600 MG PO TB12
1200.0000 mg | ORAL_TABLET | Freq: Two times a day (BID) | ORAL | Status: DC
  Administered 2015-05-20: 1200 mg via ORAL
  Filled 2015-05-20: qty 2

## 2015-05-20 MED ORDER — HYDROCODONE-HOMATROPINE 5-1.5 MG/5ML PO SYRP
5.0000 mL | ORAL_SOLUTION | ORAL | Status: DC | PRN
  Administered 2015-05-20: 5 mL via ORAL
  Filled 2015-05-20: qty 5

## 2015-05-20 MED ORDER — GUAIFENESIN ER 600 MG PO TB12
600.0000 mg | ORAL_TABLET | Freq: Two times a day (BID) | ORAL | Status: DC
  Administered 2015-05-20 – 2015-05-23 (×6): 600 mg via ORAL
  Filled 2015-05-20 (×6): qty 1

## 2015-05-20 MED ORDER — ISOSORB DINITRATE-HYDRALAZINE 20-37.5 MG PO TABS
1.0000 | ORAL_TABLET | Freq: Three times a day (TID) | ORAL | Status: DC
  Administered 2015-05-20 – 2015-05-23 (×10): 1 via ORAL
  Filled 2015-05-20 (×10): qty 1

## 2015-05-20 MED ORDER — IPRATROPIUM BROMIDE 0.02 % IN SOLN
0.5000 mg | Freq: Four times a day (QID) | RESPIRATORY_TRACT | Status: DC
  Administered 2015-05-20: 0.5 mg via RESPIRATORY_TRACT
  Filled 2015-05-20 (×2): qty 2.5

## 2015-05-20 NOTE — Progress Notes (Signed)
Resumed care of patient at this time, agree with previous nurse assessment. Will review and carry out orders.

## 2015-05-20 NOTE — Evaluation (Signed)
Physical Therapy Evaluation Patient Details Name: [name] MRN: [id number] DOB: [birth year] Today's Date: 05/20/2015   History of Present Illness  42 y,o. female with h/o CVA, HTN, CHF, seizure admitted with CHF, encephalopathy.   Clinical Impression  Pt admitted with above diagnosis. Pt currently with functional limitations due to the deficits listed below (see PT Problem List). Pt ambulated 15' with min A. Pt lives alone, doesn't have 24* assist available,  and reports having several falls at home. ST-SNF recommended.  Pt will benefit from skilled PT to increase their independence and safety with mobility to allow discharge to the venue listed below.       Follow Up Recommendations SNF    Equipment Recommendations  None recommended by PT    Recommendations for Other Services       Precautions / Restrictions Precautions Precautions: Fall Precaution Comments: pt reports 3 falls recently Restrictions Weight Bearing Restrictions: No      Mobility  Bed Mobility Overal bed mobility: Needs Assistance Bed Mobility: Supine to Sit     Supine to sit: Min assist     General bed mobility comments: verbal cues for technique, min A to initiate movement and to scoot to EOB using pad  Transfers Overall transfer level: Needs assistance Equipment used: None Transfers: Sit to/from Stand Sit to Stand: Min assist         General transfer comment: min A to rise  Ambulation/Gait Ambulation/Gait assistance: Min guard;Min assist Ambulation Distance (Feet): 14 Feet Assistive device: 1 person hand held assist Gait Pattern/deviations: Decreased step length - right;Decreased step length - left     General Gait Details: pt declined RW, very short step length B, no LOB, distance limited by pt bc she wanted to eat her lunch  Stairs            Wheelchair Mobility    Modified Rankin (Stroke Patients Only)       Balance Overall balance assessment: Needs assistance;History  of Falls Sitting-balance support: Feet supported;No upper extremity supported Sitting balance-Leahy Scale: Good     Standing balance support: No upper extremity supported Standing balance-Leahy Scale: Fair                               Pertinent Vitals/Pain Pain Assessment: No/denies pain    Home Living   Living Arrangements: Alone                    Prior Function Level of Independence: Needs assistance         Comments: pt reports she uses a cane and a RW, is independent with bathing/dressing, doesn't drive, has friends/family to assist with transportation     Hand Dominance        Extremity/Trunk Assessment   Upper Extremity Assessment: Overall WFL for tasks assessed           Lower Extremity Assessment: Generalized weakness (-4/5 knee extension, sensation intact to light touch)      Cervical / Trunk Assessment: Normal  Communication   Communication: No difficulties  Cognition Arousal/Alertness: Awake/alert Behavior During Therapy: WFL for tasks assessed/performed Overall Cognitive Status: Within Functional Limits for tasks assessed                      General Comments      Exercises        Assessment/Plan    PT Assessment Patient needs continued PT services  PT Diagnosis Generalized weakness   PT Problem List Decreased strength;Decreased activity tolerance;Decreased balance  PT Treatment Interventions Gait training;Functional mobility training;Therapeutic activities;Therapeutic exercise;Balance training;Patient/family education   PT Goals (Current goals can be found in the Care Plan section) Acute Rehab PT Goals Patient Stated Goal: pt likes to cook "old fashioned" food PT Goal Formulation: With patient Time For Goal Achievement: 06/03/15 Potential to Achieve Goals: Good    Frequency Min 3X/week   Barriers to discharge Decreased caregiver support      Co-evaluation               End of Session  Equipment Utilized During Treatment: Gait belt Activity Tolerance: Patient tolerated treatment well;No increased pain Patient left: in chair;with call bell/phone within reach;with chair alarm set;with family/visitor present Nurse Communication: Mobility status         Time: 1151-1207 PT Time Calculation (min) (ACUTE ONLY): 16 min   Charges:   PT Evaluation $PT Eval Low Complexity: 1 Procedure     PT G Codes:        Tamala Ser 05/20/2015, 12:36 PM [phone]

## 2015-05-20 NOTE — Clinical Social Work Placement (Signed)
   CLINICAL SOCIAL WORK PLACEMENT  NOTE  Date:  05/20/2015  Patient Details  Name: [name] MRN: [id number] Date of Birth: May 20, 1940  Clinical Social Work is seeking post-discharge placement for this patient at the Skilled  Nursing Facility level of care (*CSW will initial, date and re-position this form in  chart as items are completed):  Yes   Patient/family provided with [place] Clinical Social Work Department's list of facilities offering this level of care within the geographic area requested by the patient (or if unable, by the patient's family).  Yes   Patient/family informed of their freedom to choose among providers that offer the needed level of care, that participate in Medicare, Medicaid or managed care program needed by the patient, have an available bed and are willing to accept the patient.  Yes   Patient/family informed of [place]'s ownership interest in [facility] and [facility], as well as of the fact that they are under no obligation to receive care at these facilities.  PASRR submitted to EDS on 05/20/15     PASRR number received on 05/20/15     Existing PASRR number confirmed on       FL2 transmitted to all facilities in geographic area requested by pt/family on 05/20/15     FL2 transmitted to all facilities within larger geographic area on       Patient informed that his/her managed care company has contracts with or will negotiate with certain facilities, including the following:            Patient/family informed of bed offers received.  Patient chooses bed at       Physician recommends and patient chooses bed at      Patient to be transferred to   on  .  Patient to be transferred to facility by       Patient family notified on   of transfer.  Name of family member notified:        PHYSICIAN Please sign FL2     Additional Comment:    _______________________________________________ Orson Eva, LCSW 05/20/2015, 5:00  PM

## 2015-05-20 NOTE — NC FL2 (Signed)
[place] MEDICAID FL2 LEVEL OF CARE SCREENING TOOL     IDENTIFICATION  Patient Name: [name] Birthdate: 1940-12-14 Sex: female Admission Date (Current Location): 05/14/2015  [facility] and [place] Number:  Producer, television/film/video and Address:  [facility],  [address] [place], [place] [address]      Provider Number: [id number]  Attending Physician Name and Address:  Rodolph Bong, MD  Relative Name and Phone Number:       Current Level of Care: Hospital Recommended Level of Care: Skilled Nursing Facility Prior Approval Number:    Date Approved/Denied:   PASRR Number: [id number]  Discharge Plan: SNF    Current Diagnoses: Patient Active Problem List   Diagnosis Date Noted  . NICM (nonischemic cardiomyopathy) -EF 25% 05/20/2015  . No significant CAD at cath May 2012 05/20/2015  . Anemia 05/20/2015  . Diastolic dysfunction-grade 3 05/20/2015  . PNA (pneumonia) 05/20/2015  . Acute renal failure superimposed on stage 3 chronic kidney disease (HCC)   . CAP (community acquired pneumonia)   . Hyperammonemia (HCC) 05/17/2015  . Metabolic encephalopathy 05/16/2015  . Acute on chronic renal insufficiency (HCC) 05/15/2015  . Chest pain 05/14/2015  . Troponin level elevated 05/14/2015  . Chronic combined systolic and diastolic CHF (congestive heart failure) (HCC) 03/06/2014  . Acute combined systolic and diastolic congestive heart failure (HCC) 02/24/2013  . Weight gain 02/24/2013  . Volume overload 02/24/2013  . Hyperglycemia 02/24/2013  . Dyspnea 02/24/2013  . Constipation 02/24/2013  . Abdominal pain 02/24/2013  . Pleuritic chest pain 02/24/2013  . Epilepsy (HCC)   . Smoker   . 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 Protein-calorie malnutrition, severe (HCC) 02/13/2013  . Pneumobilia 02/12/2013  . Influenza A 02/11/2013  . Hyperkalemia 02/11/2013  . Hyponatremia 02/11/2013  . HCAP (healthcare-associated pneumonia) 02/11/2013  .  Liver enzyme elevation 02/11/2013  . Basal ganglia infarction (HCC) 01/08/2013  . H/O: CVA (cerebrovascular accident) 01/04/2013  . Generalized seizure (HCC) 12/28/2012  . 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 Seizure (HCC) 12/28/2012  . Metabolic acidosis 12/28/2012  . Encephalopathy 12/28/2012  . Acute renal failure (HCC) 12/28/2012    Orientation RESPIRATION BLADDER Height & Weight     Self, Place  Normal Continent Weight: 84 lb 1.6 oz (38.148 kg) Height:  5\' 2"  (157.5 cm)  BEHAVIORAL SYMPTOMS/MOOD NEUROLOGICAL BOWEL NUTRITION STATUS   (no behaviors) Convulsions/Seizures (hx of seizure disorder-on Vimpat and Keppra.) Continent Diet (Diet 2 gram sodium 1200 mL Fluid)  AMBULATORY STATUS COMMUNICATION OF NEEDS Skin   Supervision Verbally Normal                       Personal Care Assistance Level of Assistance  Bathing, Feeding, Dressing Bathing Assistance: Limited assistance Feeding assistance: Independent Dressing Assistance: Limited assistance     Functional Limitations Info  Sight, Hearing, Speech Sight Info: Adequate Hearing Info: Adequate Speech Info: Adequate    SPECIAL CARE FACTORS FREQUENCY  PT (By licensed PT), OT (By licensed OT)     PT Frequency: 5 x a week OT Frequency: 5 x a week            Contractures Contractures Info: Not present    Additional Factors Info  Code Status, Allergies Code Status Info: FULL code status Allergies Info: No Known Allergies           Current Medications (05/20/2015):  This is the current hospital active medication list Current Facility-Administered Medications  Medication Dose Route Frequency Provider Last Rate Last Dose  . acetaminophen (TYLENOL) tablet 650 mg  650 mg Oral Q6H PRN Marinda Elk, MD   650 mg at 05/17/15 0731   Or  . acetaminophen (TYLENOL) suppository 650 mg  650 mg Rectal Q6H PRN Marinda Elk, MD      . allopurinol (ZYLOPRIM) tablet 200 mg  200 mg Oral Daily  Marinda Elk, MD   200 mg at 05/20/15 0959  . arformoterol (BROVANA) nebulizer solution 15 mcg  15 mcg Nebulization BID Rodolph Bong, MD   15 mcg at 05/20/15 1605  . aspirin chewable tablet 81 mg  81 mg Oral q morning - 10a Lyn Records, MD   81 mg at 05/20/15 8938  . budesonide (PULMICORT) nebulizer solution 0.25 mg  0.25 mg Nebulization BID Rodolph Bong, MD   0.25 mg at 05/20/15 1605  . calcitRIOL (ROCALTROL) capsule 0.25 mcg  0.25 mcg Oral Daily Marinda Elk, MD   0.25 mcg at 05/20/15 0959  . carvedilol (COREG) tablet 6.25 mg  6.25 mg Oral BID WC Lyn Records, MD      . ceFEPIme (MAXIPIME) 500 mg in dextrose 5 % 50 mL IVPB  500 mg Intravenous Q24H Rollene Fare, RPH      . cyclobenzaprine (FLEXERIL) tablet 5 mg  5 mg Oral QHS Marinda Elk, MD   5 mg at 05/17/15 2114  . feeding supplement (ENSURE ENLIVE) (ENSURE ENLIVE) liquid 237 mL  237 mL Oral BID BM Marinda Elk, MD   237 mL at 05/17/15 1333  . furosemide (LASIX) tablet 20 mg  20 mg Oral Daily Lyn Records, MD   20 mg at 05/20/15 0959  . guaiFENesin (MUCINEX) 12 hr tablet 600 mg  600 mg Oral BID Rodolph Bong, MD      . heparin injection 5,000 Units  5,000 Units Subcutaneous 3 times per day Marinda Elk, MD   5,000 Units at 05/20/15 1447  . HYDROcodone-homatropine (HYCODAN) 5-1.5 MG/5ML syrup 5 mL  5 mL Oral Q4H PRN Ramiro Harvest V, MD      . ipratropium (ATROVENT) nebulizer solution 0.5 mg  0.5 mg Nebulization Q2H PRN Ramiro Harvest V, MD      . ipratropium (ATROVENT) nebulizer solution 0.5 mg  0.5 mg Nebulization Q6H Rodolph Bong, MD   0.5 mg at 05/20/15 1605  . isosorbide-hydrALAZINE (BIDIL) 20-37.5 MG per tablet 1 tablet  1 tablet Oral TID Lyn Records, MD      . lacosamide (VIMPAT) tablet 150 mg  150 mg Oral BID Marinda Elk, MD   150 mg at 05/20/15 0959  . lactulose (CHRONULAC) 10 GM/15ML solution 20 g  20 g Oral BID Catarina Hartshorn, MD   20 g at 05/20/15 0958  .  levalbuterol (XOPENEX) nebulizer solution 0.63 mg  0.63 mg Nebulization Q6H Rodolph Bong, MD   0.63 mg at 05/20/15 1605  . levalbuterol (XOPENEX) nebulizer solution 0.63 mg  0.63 mg Nebulization Q2H PRN Rodolph Bong, MD      . levETIRAcetam (KEPPRA) tablet 500 mg  500 mg Oral QHS Marinda Elk, MD   500 mg at 05/19/15 2203  . polyethylene glycol (MIRALAX / GLYCOLAX) packet 17 g  17 g Oral Daily PRN Marinda Elk, MD      . sodium chloride flush (NS) 0.9 % injection 10-40 mL  10-40 mL Intracatheter Q12H Catarina Hartshorn, MD  10 mL at 05/17/15 2115  . sodium chloride flush (NS) 0.9 % injection 10-40 mL  10-40 mL Intracatheter PRN Catarina Hartshorn, MD   10 mL at 05/20/15 1636  . vancomycin (VANCOCIN) 500 mg in sodium chloride 0.9 % 100 mL IVPB  500 mg Intravenous Q24H Rollene Fare, [facility]         Discharge Medications: Please see discharge summary for a list of discharge medications.  Relevant Imaging Results:  Relevant Lab Results:   Additional Information SSN: [id number]  Kordell Jafri, Selena Lesser A, LCSW

## 2015-05-20 NOTE — Progress Notes (Signed)
Nutrition Follow-up  DOCUMENTATION CODES:   Severe malnutrition in context of chronic illness, Underweight  INTERVENTION:  - Continue Ensure Enlive BID - Continue to encourage PO intakes of meals and supplements - RD will continue to monitor for additional nutrition-related needs  NUTRITION DIAGNOSIS:   Increased nutrient needs related to chronic illness as evidenced by estimated needs. -ongoing  GOAL:   Patient will meet greater than or equal to 90% of their needs, Weight gain -variably met for intakes, unmet/progressing for weight status  MONITOR:   PO intake, Supplement acceptance, Weight trends, Labs, I & O's  ASSESSMENT:   75 y.o. female past medical history of CVA, seizure an MI as per patient with ischemic cardiomyopathy with an EF of 35% by echo on 12/29/2012 that comes in for generalized weakness and chest pain that started 2 weeks prior to admission, accompanied this she was also short of breath with exertion with a dry cough. Tonight she started having nausea so she decided to come to the ED. She relates her pain is better now, is not pleuritic she still gets short of breath with exertion and continues to have a dry cough, she has not been taking her Lasix. He denies any fever, nausea, vomiting, diarrhea. She denies any sick contacts ongoing tobacco smoking.  4/19 Per chart review, pt consumed 100% of all meals 4/15, 75% of breakfast and 100% of lunch and dinner 4/16, and 15% of breakfast and 70% of dinner 4/17. Pt states that she did not consume breakfast this AM due to nausea upon waking with small amount of associated emesis. Pt reports RN aware of nausea; she states she is still experiencing slight nausea at time of RD visit and ongoing abdominal discomfort/pain/pressure. Pt noted to have acute metabolic encephalopathy which is improving, per MD notes. Pt with some intermittent confusion throughout discussion and she was unable to provide information from PTA. She was  coughing intermittently throughout discussion and states that this is a more recent development for her but denies it interfering with chewing/swallowing. Unopened Ensure Enlive on bedside table. Pt initially states that she has not consumed any of supplement during admission but later states that she has been drinking supplement. Continue to provide encouragement to consume supplements when they are provided to pt.  Physical assessment shows severe muscle and fat wasting. Pt meets criteria for malnutrition based on these findings. Weight down 2 lbs since 4/14 with some fluctuation since that date.  Pt variably meeting needs. Medications reviewed; 20 mg oral Lasix/day. Labs reviewed; BUN/creatinine elevated but trending down, GFR: 38.    4/14 - Pt with hx of COPD and stroke.  - Pt out of room versus in the restroom at time of visit.  - All information pulled from chart at this time.  - Unable to complete physical assessment at this time but will do so at follow-up.  - Per chart review, pt weight has been mainly stable (80-88 lbs) x2 years.  - Most recently, she has gained 3 lbs in the past 5 months.  - Ensure [name] has been ordered BID.    Diet Order:  Diet 2 gram sodium Room service appropriate?: Yes; Fluid consistency:: Thin; Fluid restriction:: 1200 mL Fluid  Skin:  Reviewed, no issues  Last BM:  4/17  Height:   Ht Readings from Last 1 Encounters:  05/14/15 _0  (1.575 m)    Weight:   Wt Readings from Last 1 Encounters:  05/20/15 84 lb 1.6 oz (38.148 kg)  Ideal Body Weight:  50 kg (kg)  BMI:  Body mass index is 15.38 kg/(m^2).  Estimated Nutritional Needs:   Kcal:  1180-1380 (30-35 kcal/kg)  Protein:  60-70 grams  Fluid:  1.1-1.3 L/day  EDUCATION NEEDS:   No education needs identified at this time     Jarome Matin, RD, LDN Inpatient Clinical Dietitian Pager # [phone] After hours/weekend pager # [phone]

## 2015-05-20 NOTE — Progress Notes (Signed)
Patient Name: [name] Date of Encounter: 05/20/2015  Active Problems:   Acute combined systolic and diastolic congestive heart failure (HCC)   Hypertensive heart and kidney disease   Acute on chronic renal insufficiency (HCC)   Acute encephalopathy   Hyperammonemia (HCC)   NICM (nonischemic cardiomyopathy) -EF 25%   Anemia   Diastolic dysfunction-grade 3   H/O: CVA (cerebrovascular accident)   Hyperkalemia   Protein-calorie malnutrition, severe (HCC)   Chest pain   Troponin level elevated   No significant CAD at cath May 2012   Primary Cardiologist: new, Dr Tenny Craw  Patient Profile: 75 yo female w/ hx HTN, CVA, no significant CAD in 2012 at cath, CHF (managed by primary MD). Admitted 04/13 with weakness and CP. Worsening CHF, ARI, abnl LFTs. Echo 4/14 showed grade 3 DD and EF of 25%. Improved CHF with Milrinone.   SUBJECTIVE: Pt up in bed. She denies any pain or SOB  OBJECTIVE Filed Vitals:   05/19/15 0836 05/19/15 1740 05/19/15 2148 05/20/15 0548  BP: 134/81 141/104 139/98 141/90  Pulse:  116 121 122  Temp:   98.2 F (36.8 C) 98.1 F (36.7 C)  TempSrc:   Oral Oral  Resp:   20 24  Height:      Weight:    84 lb 1.6 oz (38.148 kg)  SpO2:   100% 100%    Intake/Output Summary (Last 24 hours) at 05/20/15 0922 Last data filed at 05/20/15 0546  Gross per 24 hour  Intake    236 ml  Output    460 ml  Net   -224 ml   Filed Weights   05/18/15 0517 05/19/15 0439 05/20/15 0548  Weight: 88 lb 13.5 oz (40.3 kg) 83 lb 12.4 oz (38 kg) 84 lb 1.6 oz (38.148 kg)    PHYSICAL EXAM General: Well developed, female in no acute distress. Head: Normocephalic, atraumatic.  Neck: Supple without bruits, JVD 12 cm. Lungs:  Resp regular and unlabored, decreased BS bases, few rales. Heart: RRR, S1, S2, no S3, S4, 2/6 murmur; no rub. Abdomen: Soft, non-tender, non-distended, BS + x 4.  Extremities: No clubbing, cyanosis, edema.  Neuro: Alert and oriented X 2. Moves all  extremities spontaneously.   LABS: CBC:  Recent Labs  05/20/15 0450  WBC 10.1  HGB 8.8*  HCT 25.5*  MCV 97.0  PLT 315   Basic Metabolic Panel:  Recent Labs  96/04/54 0518 05/20/15 0450  NA 140 138  K 4.9 4.8  CL 111 108  CO2 19* 19*  GLUCOSE 107* 119*  BUN 35* 28*  CREATININE 1.58* 1.53*  CALCIUM 9.4 9.4   BNP:  B NATRIURETIC PEPTIDE  Date/Time Value Ref Range Status  05/14/2015 04:19 AM >4500.0* 0.0 - 100.0 pg/mL Final    TELE:  Generally ST with IVCD  Echo: 05/15/15 Study Conclusions  - Left ventricle: The cavity size was normal. Wall thickness was  increased in a pattern of mild LVH. Systolic function was  severely reduced. The estimated ejection fraction was in the  range of 25% to 30%. Doppler parameters are consistent with a  reversible restrictive pattern, indicative of decreased left  ventricular diastolic compliance and/or increased left atrial  pressure (grade 3 diastolic dysfunction). - Ventricular septum: The contour showed diastolic flattening. - Aortic valve: There was trivial regurgitation. - Mitral valve: There was mild regurgitation. - Right ventricle: The cavity size was severely dilated. Systolic  function was moderately to severely reduced. - Right atrium: The atrium  was moderately dilated. - Tricuspid valve: There was wide-open regurgitation.  Impressions:  - The LV systolic and diastolic function are severely reduced.  The TV has wide open TR.  The RV / RA gradient was measured only at 15 ( giving an  estimated PA pressure of 30)  The RV / PA pressure appears to be higher than that - the RV is  enlarged . There is flattening of the ventricular septum in  diastole.   Current Medications:  . allopurinol  200 mg Oral Daily  . aspirin  81 mg Oral q morning - 10a  . calcitRIOL  0.25 mcg Oral Daily  . carvedilol  3.125 mg Oral BID WC  . ceFEPime (MAXIPIME) IV  500 mg Intravenous Q24H  . cyclobenzaprine  5 mg Oral  QHS  . feeding supplement (ENSURE ENLIVE)  237 mL Oral BID BM  . furosemide  20 mg Oral Daily  . guaiFENesin  1,200 mg Oral BID  . heparin  5,000 Units Subcutaneous 3 times per day  . isosorbide-hydrALAZINE  1 tablet Oral BID  . lacosamide  150 mg Oral BID  . lactulose  20 g Oral BID  . levETIRAcetam  500 mg Oral QHS  . sodium chloride flush  10-40 mL Intracatheter Q12H  . sodium chloride flush  3 mL Intravenous Q12H  . sodium chloride flush  3 mL Intravenous Q12H  . vancomycin  500 mg Intravenous Q24H      ASSESSMENT AND PLAN:  1. Acute on chronic combined systolic and diastolic CHF (congestive heart failure) (HCC) - echo with severe biventricular failure. - got 2 doses on Lasix 40 mg IV on 04/13, now on 20 mg PO daily since due to BUN/Cr bump - now off Milrinone   Otherwise, per IM Active Problems:   Acute combined systolic and diastolic congestive heart failure (HCC)   Hypertensive heart and kidney disease   Acute on chronic renal insufficiency (HCC)   Acute encephalopathy   Hyperammonemia (HCC)   NICM (nonischemic cardiomyopathy) -EF 25%   Anemia   Diastolic dysfunction-grade 3   H/O: CVA (cerebrovascular accident)   Hyperkalemia   Protein-calorie malnutrition, severe (HCC)   Chest pain   Troponin level elevated   No significant CAD at cath May 2012  Plan: Coreg resumed. Can probably push BiDil to TID (B/P running a little high).   Deland Pretty , PA-C 9:22 AM 05/20/2015 The patient has been seen in conjunction with Corine Shelter, PAC. All aspects of care have been considered and discussed. The patient has been personally interviewed, examined, and all clinical data has been reviewed.   Off milrinone and maintaining stable hemodynamics. Urine output is decreasing.  Plan to uptitrate beta blocker, Bidil and diuretic if tolerated.  This will be done in a staggered fashion.

## 2015-05-20 NOTE — Progress Notes (Signed)
PROGRESS NOTE    [name]  ZOX:[id number] DOB: August 21, 1940 DOA: 05/14/2015 PCP: [name], MD (Confirm with patient/family/NH records and if not entered, this HAS to be entered at [facility] point of entry. "No PCP" if truly none.) Outpatient Specialists: Consulting civil engineer speciality and name if known)    Brief Narrative:  75 year old female who presented with 1-2 week history of shortness of breath and dyspnea on exertion with associated dizziness. The patient stated that in the past 3 days prior to admission, she has had a worsening of her symptoms with intermittent chest discomfort. She denied any fevers, chills, nausea, vomiting, diarrhea, syncope, mechanical falls. She stated that her primary care physician recently contacted her and told her to stop her furosemide. She had been off of for furosemide for slightly over one week prior to this admission. Initial workup revealed that the patient had small bilateral pleural effusions and decompensated CHF. The patient was started on intravenous furosemide with good clinical improvement. Furosemide had been stopped due to significant elevation of her serum creatinine. Cardiology was consulted due to worsening echocardiographic findings with EF 25-30 percent and wide open TR. The patient was subsequently placed on a milrinone drip by cardiology. Milirone drip has been discontinued and patient on oral lasix. CXR 05/19/2015 c/w PNA. Patient on empiric IV antibiotics.  Assessment & Plan:   Principal Problem:   Acute combined systolic and diastolic congestive heart failure (HCC) Active Problems:   PNA (pneumonia)   Generalized seizure (HCC)   Hypertensive heart and kidney disease   H/O: CVA (cerebrovascular accident)   Hyperkalemia   Protein-calorie malnutrition, severe (HCC)   Chest pain   Troponin level elevated   Acute on chronic renal insufficiency (HCC)   Metabolic encephalopathy   Hyperammonemia (HCC)   NICM (nonischemic cardiomyopathy)  -EF 25%   No significant CAD at cath May 2012   Anemia   Diastolic dysfunction-grade 3  #1 acute on chronic combined systolic and diastolic heart failure Questionable etiology. Patient was on milrinone drip which has since been discontinued. Cardiac enzymes were minimally elevated at 0.08 which seemed to have plateaued and felt to be elevated secondary to CHF exacerbation. Patient's weight is 84 pounds today. 2-D echo with a EF of 25-30% with grade 3 diastolic dysfunction with wide-open tricuspid regurgitation. Patient had received 2 doses of IV Lasix and currently on oral Lasix at 20 mg daily. Continue aspirin, Coreg, BiDil. Per cardiology.  #2 pneumonia/HCAP  Per chest x-ray. Patient has been started empirically on IV vancomycin and IV cefepime. Continue Mucinex. Place on nebulizer treatments. Place on Pulmicort and Brovana. Follow.  #3 acute on chronic kidney disease stage III Likely secondary to problem #1. Baseline creatinine 1.2-1.6. Renal function improving creatinine currently at 1.53. Follow.  #4 elevated troponin Likely secondary to problem #1. Troponins plateaued.  #5 acute metabolic encephalopathy Multifactorial secondary to problems #1,2,3. TSH at 1.765. B-12 levels at 516. Ammonia levels have trended down from 40-24 on lactulose. Urinalysis negative for pyuria. Patient with clinical improvement. Follow.  #6 hyperammonemia Improved on lactulose.  #7 severe protein calorie malnutrition Continue nutritional supplementation.  #8 hyperkalemia Improved with diuresis.  #9 seizure disorder Stable. Continue Vimpat and Keppra.  #10 history of stroke Continue aspirin for secondary stroke prevention.   DVT prophylaxis: Heparin Code Status: Full Family Communication: Updated patient and family at bedside. Disposition Plan: Home when medically stable.   Consultants:   Cardiology: Dr. Ladona Ridgel 05/16/2015  Procedures:   Chest x-ray 05/12/2015, 05/17/2015,  05/19/2015  Renal  ultrasound 05/16/2015  2-D echo 05/15/2015  Antimicrobials:   IV cefepime 05/19/2015  IV vancomycin 05/19/2015   Subjective: Patient complaining of cough. Patient states no significant change or shortness of breath. Patient denies any chest pain.  Objective: Filed Vitals:   05/19/15 1740 05/19/15 2148 05/20/15 0548 05/20/15 1404  BP: 141/104 139/98 141/90 115/74  Pulse: 116 121 122 106  Temp:  98.2 F (36.8 C) 98.1 F (36.7 C) 98 F (36.7 C)  TempSrc:  Oral Oral Oral  Resp:  Height:      Weight:   38.148 kg (84 lb 1.6 oz)   SpO2:  100% 100% 100%    Intake/Output Summary (Last 24 hours) at 05/20/15 1617 Last data filed at 05/20/15 1000  Gross per 24 hour  Intake    200 ml  Output    610 ml  Net   -410 ml   Filed Weights   05/18/15 0517 05/19/15 0439 05/20/15 0548  Weight: 40.3 kg (88 lb 13.5 oz) 38 kg (83 lb 12.4 oz) 38.148 kg (84 lb 1.6 oz)    Examination:  General exam: Appears calm and comfortable  Respiratory system: Decreased breath sounds in the bases. Some scattered crackles. Minimal to mild expiratory wheezing. Cardiovascular system: S1 & S2 heard, RRR. No JVD, murmurs, rubs, gallops or clicks. No pedal edema. Gastrointestinal system: Abdomen is nondistended, soft and nontender. No organomegaly or masses felt. Normal bowel sounds heard. Central nervous system: Alert and oriented. No focal neurological deficits. Extremities: Symmetric 5 x 5 power. Skin: No rashes, lesions or ulcers Psychiatry: Judgement and insight appear normal. Mood & affect appropriate.     Data Reviewed: I have personally reviewed following labs and imaging studies  CBC:  Recent Labs Lab 05/14/15 0356 05/16/15 1125 05/20/15 0450  WBC 9.6  --  10.1  NEUTROABS 7.4  --   --   HGB 10.4*  --  8.8*  HCT 30.7* 27.5* 25.5*  MCV 95.0  --  97.0  PLT 350  --  315   Basic Metabolic Panel:  Recent Labs Lab 05/14/15 1151  05/16/15 0448  05/17/15 0530 05/18/15 0300 05/19/15 0518 05/20/15 0450  NA  --   < > 138 139 143 140 138  K  --   < > 4.7 4.9 4.7 4.9 4.8  CL  --   < > 106 106 108 111 108  CO2  --   < > 19* 23 24 19* 19*  GLUCOSE  --   < > 112* 120* 104* 107* 119*  BUN  --   < > 69* 64* 53* 35* 28*  CREATININE  --   < > 3.19* 2.67* 2.07* 1.58* 1.53*  CALCIUM  --   < > 8.3* 9.1 9.3 9.4 9.4  MG 2.2  --   --   --   --   --   --   < > = values in this interval not displayed. GFR: Estimated Creatinine Clearance: 20 mL/min (by C-G formula based on Cr of 1.53). Liver Function Tests:  Recent Labs Lab 05/14/15 0356  AST 85*  ALT 74*  ALKPHOS 158*  BILITOT 1.0  PROT 7.9  ALBUMIN 4.3   No results for input(s): LIPASE, AMYLASE in the last 168 hours.  Recent Labs Lab 05/16/15 1125 05/19/15 0720  AMMONIA 40* 24   Coagulation Profile: No results for input(s): INR, PROTIME in the last 168 hours. Cardiac Enzymes:  Recent Labs Lab 05/14/15 1191 05/14/15 1151  05/14/15 1745 05/14/15 2337  TROPONINI 0.08* 0.08* 0.08* 0.08*   BNP (last 3 results) No results for input(s): PROBNP in the last 8760 hours. HbA1C: No results for input(s): HGBA1C in the last 72 hours. CBG:  Recent Labs Lab 05/14/15 0210  GLUCAP 116*   Lipid Profile: No results for input(s): CHOL, HDL, LDLCALC, TRIG, CHOLHDL, LDLDIRECT in the last 72 hours. Thyroid Function Tests: No results for input(s): TSH, T4TOTAL, FREET4, T3FREE, THYROIDAB in the last 72 hours. Anemia Panel: No results for input(s): VITAMINB12, FOLATE, FERRITIN, TIBC, IRON, RETICCTPCT in the last 72 hours. Urine analysis:    Component Value Date/Time   COLORURINE YELLOW 05/16/2015 1605   APPEARANCEUR CLEAR 05/16/2015 1605   LABSPEC 1.016 05/16/2015 1605   PHURINE 5.5 05/16/2015 1605   GLUCOSEU NEGATIVE 05/16/2015 1605   HGBUR NEGATIVE 05/16/2015 1605   BILIRUBINUR NEGATIVE 05/16/2015 1605   KETONESUR NEGATIVE 05/16/2015 1605   PROTEINUR 30* 05/16/2015 1605    UROBILINOGEN 0.2 03/05/2014 1324   NITRITE NEGATIVE 05/16/2015 1605   LEUKOCYTESUR SMALL* 05/16/2015 1605   Sepsis Labs: @LABRCNTIP (procalcitonin:4,lacticidven:4)  ) Recent Results (from the past 240 hour(s))  Urine culture     Status: None   Collection Time: 05/14/15  3:26 AM  Result Value Ref Range Status   Specimen Description URINE, CLEAN CATCH  Final   Special Requests NONE  Final   Culture MULTIPLE SPECIES PRESENT, SUGGEST RECOLLECTION  Final   Report Status 05/15/2015 FINAL  Final  Urine culture     Status: None   Collection Time: 05/16/15  4:05 PM  Result Value Ref Range Status   Specimen Description URINE, CLEAN CATCH  Final   Special Requests NONE  Final   Culture MULTIPLE SPECIES PRESENT, SUGGEST RECOLLECTION  Final   Report Status 05/18/2015 FINAL  Final  MRSA PCR Screening     Status: None   Collection Time: 05/19/15  8:23 PM  Result Value Ref Range Status   MRSA by PCR NEGATIVE NEGATIVE Final    Comment:        The GeneXpert MRSA Assay (FDA approved for NASAL specimens only), is one component of a comprehensive MRSA colonization surveillance program. It is not intended to diagnose MRSA infection nor to guide or monitor treatment for MRSA infections.   Culture, blood (Routine X 2) w Reflex to ID Panel     Status: None (Preliminary result)   Collection Time: 05/19/15  9:17 PM  Result Value Ref Range Status   Specimen Description BLOOD LEFT HAND  Final   Special Requests IN PEDIATRIC BOTTLE 1 CC  Final   Culture   Final    NO GROWTH < 24 HOURS Performed at [facility]    Report Status PENDING  Incomplete  Culture, blood (Routine X 2) w Reflex to ID Panel     Status: None (Preliminary result)   Collection Time: 05/19/15  9:22 PM  Result Value Ref Range Status   Specimen Description BLOOD LEFT ANTECUBITAL  Final   Special Requests IN PEDIATRIC BOTTLE 1 ML  Final   Culture   Final    NO GROWTH < 24 HOURS Performed at [facility]     Report Status PENDING  Incomplete         Radiology Studies: Dg Chest Port 1 View  05/19/2015  CLINICAL DATA:  Right-sided chest pain, shortness of breath, dry cough for the past 3 weeks. History of CHF. EXAM: PORTABLE CHEST 1 VIEW COMPARISON:  05/12/2015; 03/08/2014; 03/05/2014; 02/25/2013 FINDINGS:  Grossly unchanged enlarged cardiac silhouette and mediastinal contours. Interval placement of right upper extremity approach PICC line with tip projected over the superior cavoatrial junction. Interval development of left basilar potential airspace opacities with development of ill-defined heterogeneous slightly nodular interstitial opacities within the peripheral aspect of the left lung. No pneumothorax. Unchanged bones. IMPRESSION: 1. Worsening ill-defined nodular interstitial opacities within the left lung with relative area of consolidation within the left lower lung - findings worrisome for multifocal infection, including potential atypical etiologies. 2. Right upper extremity approach PICC line tip terminates over the superior cavoatrial junction. Electronically Signed   By: Simonne Come M.D.   On: 05/19/2015 18:47        Scheduled Meds: . allopurinol  200 mg Oral Daily  . arformoterol  15 mcg Nebulization BID  . aspirin  81 mg Oral q morning - 10a  . budesonide (PULMICORT) nebulizer solution  0.25 mg Nebulization BID  . calcitRIOL  0.25 mcg Oral Daily  . carvedilol  6.25 mg Oral BID WC  . ceFEPime (MAXIPIME) IV  500 mg Intravenous Q24H  . cyclobenzaprine  5 mg Oral QHS  . feeding supplement (ENSURE ENLIVE)  237 mL Oral BID BM  . furosemide  20 mg Oral Daily  . guaiFENesin  600 mg Oral BID  . heparin  5,000 Units Subcutaneous 3 times per day  . ipratropium  0.5 mg Nebulization Q6H  . isosorbide-hydrALAZINE  1 tablet Oral TID  . lacosamide  150 mg Oral BID  . lactulose  20 g Oral BID  . levalbuterol  0.63 mg Nebulization Q6H  . levETIRAcetam  500 mg Oral QHS  . sodium chloride  flush  10-40 mL Intracatheter Q12H  . vancomycin  500 mg Intravenous Q24H   Continuous Infusions:    LOS: 6 days    Time spent: 35 minutes    THOMPSON,DANIEL, MD [facility] Pager [phone]  If 7PM-7AM, please contact night-coverage [email or web address] Password [facility] 05/20/2015, 4:17 PM

## 2015-05-21 DIAGNOSIS — Z8673 Personal history of transient ischemic attack (TIA), and cerebral infarction without residual deficits: Secondary | ICD-10-CM

## 2015-05-21 DIAGNOSIS — J189 Pneumonia, unspecified organism: Secondary | ICD-10-CM | POA: Insufficient documentation

## 2015-05-21 DIAGNOSIS — R079 Chest pain, unspecified: Secondary | ICD-10-CM

## 2015-05-21 LAB — CBC
HCT: 26.3 % — ABNORMAL LOW (ref 36.0–46.0)
Hemoglobin: 8.8 g/dL — ABNORMAL LOW (ref 12.0–15.0)
MCH: 31.9 pg (ref 26.0–34.0)
MCHC: 33.5 g/dL (ref 30.0–36.0)
MCV: 95.3 fL (ref 78.0–100.0)
Platelets: 330 10*3/uL (ref 150–400)
RBC: 2.76 MIL/uL — ABNORMAL LOW (ref 3.87–5.11)
RDW: 19.9 % — ABNORMAL HIGH (ref 11.5–15.5)
WBC: 8.6 10*3/uL (ref 4.0–10.5)

## 2015-05-21 LAB — LEGIONELLA PNEUMOPHILA SEROGP 1 UR AG: L. pneumophila Serogp 1 Ur Ag: NEGATIVE

## 2015-05-21 LAB — BASIC METABOLIC PANEL
Anion gap: 13 (ref 5–15)
BUN: 28 mg/dL — ABNORMAL HIGH (ref 6–20)
CO2: 22 mmol/L (ref 22–32)
Calcium: 9.3 mg/dL (ref 8.9–10.3)
Chloride: 107 mmol/L (ref 101–111)
Creatinine, Ser: 1.64 mg/dL — ABNORMAL HIGH (ref 0.44–1.00)
GFR calc Af Amer: 35 mL/min — ABNORMAL LOW (ref >60)
GFR calc non Af Amer: 30 mL/min — ABNORMAL LOW (ref >60)
Glucose, Bld: 114 mg/dL — ABNORMAL HIGH (ref 65–99)
Potassium: 4.7 mmol/L (ref 3.5–5.1)
Sodium: 142 mmol/L (ref 135–145)

## 2015-05-21 LAB — CARBOXYHEMOGLOBIN
Carboxyhemoglobin: 1.5 % (ref 0.5–1.5)
Methemoglobin: 0.7 % (ref 0.0–1.5)
O2 Saturation: 57.2 %
Total hemoglobin: 10.2 g/dL — ABNORMAL LOW (ref 12.0–16.0)

## 2015-05-21 LAB — HIV ANTIBODY (ROUTINE TESTING W REFLEX): HIV Screen 4th Generation wRfx: NONREACTIVE

## 2015-05-21 MED ORDER — FUROSEMIDE 40 MG PO TABS: 40.0000 mg | ORAL_TABLET | Freq: Every day | ORAL | Status: DC

## 2015-05-21 NOTE — Clinical Social Work Note (Signed)
Clinical Social Work Assessment  Patient Details  Name: [name] MRN: [id number] Date of Birth: March 14, 1940  Date of referral:  05/20/15               Reason for consult:  Discharge Planning                Permission sought to share information with:  Facility Sport and exercise psychologist Permission granted to share information::  Yes, Verbal Permission Granted  Name::     [name]  Agency::     Relationship::  son  Contact Information:  [phone]  Housing/Transportation Living arrangements for the past 2 months:  [place] of Information:  Patient, Adult Children Patient Interpreter Needed:  None Criminal Activity/Legal Involvement Pertinent to Current Situation/Hospitalization:  No - Comment as needed Significant Relationships:  Adult Children Lives with:  Self Do you feel safe going back to the place where you live?  No Need for family participation in patient care:  Yes (Comment)  Care giving concerns:  Pt admitted from home where pt lives alone. PT recommending SNF. Pt son lives in [place] area and planning to come to hospital on 4/20.    Social Worker assessment / plan:  CSW received referral for New SNF.  CSW met with pt at bedside. Pt neighbor/friend present and called pt son, [name] to discuss plan. Pt involved in assessment as well. CSW discussed recommendation for rehab at [facility]. Pt and pt son are agreeable to pt needing more care than pt can manage at home. Pt son states that he is hopeful that pt can eventually get to [facility], but agreeable to [facility] search and if any facilities are in system search for [employer] pt son would like information sent to those facilities. Pt agreeable to this plan.   CSW completed FL2 and initiated SNF search to [facility]. There was one facility in system in [place] area and information sent to that facility.   CSW to follow up with pt and pt son re: SNF bed offers. Pt has [employer] [facility] that requires authorization prior to pt d/c.   CSW to continue to follow to provide support and assist with pt disposition needs.     Employment status:  Retired Nurse, adult PT Recommendations:  [place] / Referral to community resources:  [place]  Patient/Family's Response to care:  Pt alert and oriented x 2. Pt had periods of confusion. Pt son plans to come to hospital on 4/20 to further discuss plan.   Patient/Family's Understanding of and Emotional Response to Diagnosis, Current Treatment, and Prognosis:  Pt son plan to come to hospital on 4/20 for further discussion about pt diagnosis and treatment plan.   Emotional Assessment Appearance:  Appears stated age Attitude/Demeanor/Rapport:  Other (cooperative, confused at times) Affect (typically observed):  Appropriate Orientation:  Oriented to Self, Oriented to Place Alcohol / Substance use:  Not Applicable Psych involvement (Current and /or in the community):  No (Comment)  Discharge Needs  Concerns to be addressed:  Discharge Planning Concerns Readmission within the last 30 days:  No Current discharge risk:  None Barriers to Discharge:  No Barriers Identified   [place], [place], LCSW 05/21/2015, 10:25 AM  [phone]

## 2015-05-21 NOTE — Progress Notes (Signed)
PROGRESS NOTE    Tine Mucklow  ZWC:[id number] DOB: 1940-03-09 DOA: 05/14/2015 PCP: [name], MD (Confirm with patient/family/NH records and if not entered, this HAS to be entered at [facility] point of entry. "No PCP" if truly none.) Outpatient Specialists: Consulting civil engineer speciality and name if known)    Brief Narrative:  75 year old female who presented with 1-2 week history of shortness of breath and dyspnea on exertion with associated dizziness. The patient stated that in the past 3 days prior to admission, she has had a worsening of her symptoms with intermittent chest discomfort. She denied any fevers, chills, nausea, vomiting, diarrhea, syncope, mechanical falls. She stated that her primary care physician recently contacted her and told her to stop her furosemide. She had been off of for furosemide for slightly over one week prior to this admission. Initial workup revealed that the patient had small bilateral pleural effusions and decompensated CHF. The patient was started on intravenous furosemide with good clinical improvement. Furosemide had been stopped due to significant elevation of her serum creatinine. Cardiology was consulted due to worsening echocardiographic findings with EF 25-30 percent and wide open TR. The patient was subsequently placed on a milrinone drip by cardiology. Milirone drip has been discontinued and patient on oral lasix. CXR 05/19/2015 c/w PNA. Patient on empiric IV antibiotics.  Assessment & Plan:   Principal Problem:   Acute combined systolic and diastolic congestive heart failure (HCC) Active Problems:   Generalized seizure (HCC)   Hypertensive heart and kidney disease   H/O: CVA (cerebrovascular accident)   Hyperkalemia   Protein-calorie malnutrition, severe- wgt 86 lbs   Chest pain   Troponin level elevated   Acute on chronic renal insufficiency (HCC)   Metabolic encephalopathy   Hyperammonemia (HCC)   NICM (nonischemic cardiomyopathy) -EF 25%  No significant CAD at cath May 2012   Anemia   Diastolic dysfunction-grade 3   CAP (community acquired pneumonia)  #1 acute on chronic combined systolic and diastolic heart failure Questionable etiology. Patient was on milrinone drip which has since been discontinued. Cardiac enzymes were minimally elevated at 0.08 which seemed to have plateaued and felt to be elevated secondary to CHF exacerbation. Patient's weight is 84 pounds today. 2-D echo with a EF of 25-30% with grade 3 diastolic dysfunction with wide-open tricuspid regurgitation. Patient had received 2 doses of IV Lasix and currently on oral Lasix at 20 mg daily. Continue aspirin, Coreg, BiDil. Per cardiology overall cardiovascular prognosis is very poor and cardiology recommending palliative care involvement. Will consult with palliative care for goals of care.  #2 pneumonia/HCAP  Per chest x-ray. Patient has been started empirically on IV vancomycin and IV cefepime. Continue Mucinex. Continue nebulizer treatments, Pulmicort and Brovana. Discontinue IV vancomycin. Will transition to oral Augmentin tomorrow. Follow.  #3 acute on chronic kidney disease stage III Likely secondary to problem #1. Baseline creatinine 1.2-1.6. Renal function improving creatinine currently at 1.64. Follow.  #4 elevated troponin Likely secondary to problem #1. Troponins plateaued. Cardiology following.  #5 tachycardia Felt to be secondary to reflex tachycardia from beta blocker which was held on admission and resumed yesterday. Heart rate is increasing. Per cardiology.  #6 acute metabolic encephalopathy Multifactorial secondary to problems #1,2,3. TSH at 1.765. B-12 levels at 516. Ammonia levels have trended down from 40-24 on lactulose. Urinalysis negative for pyuria. Patient with clinical improvement. Follow.  #7 hyperammonemia Improved on lactulose. Discontinue lactulose.  #8 severe protein calorie malnutrition Continue nutritional  supplementation.  #9 hyperkalemia Improved with diuresis.  #10  seizure disorder Stable. Continue Vimpat and Keppra.  #11 history of stroke Continue aspirin for secondary stroke prevention.   DVT prophylaxis: Heparin Code Status: Full Family Communication: Updated patient and son at bedside. Disposition Plan: SNF when medically stable.   Consultants:   Cardiology: Dr. Ladona Ridgel 05/16/2015  Procedures:   Chest x-ray 05/12/2015, 05/17/2015, 05/19/2015  Renal ultrasound 05/16/2015  2-D echo 05/15/2015  Antimicrobials:   IV cefepime 05/19/2015  IV vancomycin 05/19/2015   Subjective: Patient states she's feeling better. Cough improving. Shortness of breath improvement.  Objective: Filed Vitals:   05/21/15 0542 05/21/15 0826 05/21/15 1014 05/21/15 1019  BP: 126/83 116/74    Pulse: 115 107    Temp: 98.3 F (36.8 C)     TempSrc: Oral     Resp: 16     Height:      Weight: 39.1 kg (86 lb 3.2 oz)     SpO2: 100%  98% 98%    Intake/Output Summary (Last 24 hours) at 05/21/15 1316 Last data filed at 05/21/15 0000  Gross per 24 hour  Intake    270 ml  Output      0 ml  Net    270 ml   Filed Weights   05/19/15 0439 05/20/15 0548 05/21/15 0542  Weight: 38 kg (83 lb 12.4 oz) 38.148 kg (84 lb 1.6 oz) 39.1 kg (86 lb 3.2 oz)    Examination:  General exam: Appears calm and comfortable  Respiratory system: Decreased breath sounds in the bases. Minimal expiratory wheezing. Cardiovascular system: Tachycardia. No JVD, murmurs, rubs, gallops or clicks. No pedal edema. Gastrointestinal system: Abdomen is nondistended, soft and nontender. No organomegaly or masses felt. Normal bowel sounds heard. Central nervous system: Alert and oriented. No focal neurological deficits. Extremities: Symmetric 5 x 5 power. Skin: No rashes, lesions or ulcers Psychiatry: Judgement and insight appear normal. Mood & affect appropriate.     Data Reviewed: I have personally reviewed following  labs and imaging studies  CBC:  Recent Labs Lab 05/16/15 1125 05/20/15 0450 05/21/15 0500  WBC  --  10.1 8.6  HGB  --  8.8* 8.8*  HCT 27.5* 25.5* 26.3*  MCV  --  97.0 95.3  PLT  --  315 330   Basic Metabolic Panel:  Recent Labs Lab 05/17/15 0530 05/18/15 0300 05/19/15 0518 05/20/15 0450 05/21/15 0500  NA 139 143 140 138 142  K 4.9 4.7 4.9 4.8 4.7  CL 106 108 111 108 107  CO2 23 24 19* 19* 22  GLUCOSE 120* 104* 107* 119* 114*  BUN 64* 53* 35* 28* 28*  CREATININE 2.67* 2.07* 1.58* 1.53* 1.64*  CALCIUM 9.1 9.3 9.4 9.4 9.3   GFR: Estimated Creatinine Clearance: 19.1 mL/min (by C-G formula based on Cr of 1.64). Liver Function Tests: No results for input(s): AST, ALT, ALKPHOS, BILITOT, PROT, ALBUMIN in the last 168 hours. No results for input(s): LIPASE, AMYLASE in the last 168 hours.  Recent Labs Lab 05/16/15 1125 05/19/15 0720  AMMONIA 40* 24   Coagulation Profile: No results for input(s): INR, PROTIME in the last 168 hours. Cardiac Enzymes:  Recent Labs Lab 05/14/15 1745 05/14/15 2337  TROPONINI 0.08* 0.08*   BNP (last 3 results) No results for input(s): PROBNP in the last 8760 hours. HbA1C: No results for input(s): HGBA1C in the last 72 hours. CBG: No results for input(s): GLUCAP in the last 168 hours. Lipid Profile: No results for input(s): CHOL, HDL, LDLCALC, TRIG, CHOLHDL, LDLDIRECT in the last 72 hours.  Thyroid Function Tests: No results for input(s): TSH, T4TOTAL, FREET4, T3FREE, THYROIDAB in the last 72 hours. Anemia Panel: No results for input(s): VITAMINB12, FOLATE, FERRITIN, TIBC, IRON, RETICCTPCT in the last 72 hours. Urine analysis:    Component Value Date/Time   COLORURINE YELLOW 05/16/2015 1605   APPEARANCEUR CLEAR 05/16/2015 1605   LABSPEC 1.016 05/16/2015 1605   PHURINE 5.5 05/16/2015 1605   GLUCOSEU NEGATIVE 05/16/2015 1605   HGBUR NEGATIVE 05/16/2015 1605   BILIRUBINUR NEGATIVE 05/16/2015 1605   KETONESUR NEGATIVE 05/16/2015  1605   PROTEINUR 30* 05/16/2015 1605   UROBILINOGEN 0.2 03/05/2014 1324   NITRITE NEGATIVE 05/16/2015 1605   LEUKOCYTESUR SMALL* 05/16/2015 1605   Sepsis Labs: @LABRCNTIP (procalcitonin:4,lacticidven:4)  ) Recent Results (from the past 240 hour(s))  Urine culture     Status: None   Collection Time: 05/14/15  3:26 AM  Result Value Ref Range Status   Specimen Description URINE, CLEAN CATCH  Final   Special Requests NONE  Final   Culture MULTIPLE SPECIES PRESENT, SUGGEST RECOLLECTION  Final   Report Status 05/15/2015 FINAL  Final  Urine culture     Status: None   Collection Time: 05/16/15  4:05 PM  Result Value Ref Range Status   Specimen Description URINE, CLEAN CATCH  Final   Special Requests NONE  Final   Culture MULTIPLE SPECIES PRESENT, SUGGEST RECOLLECTION  Final   Report Status 05/18/2015 FINAL  Final  MRSA PCR Screening     Status: None   Collection Time: 05/19/15  8:23 PM  Result Value Ref Range Status   MRSA by PCR NEGATIVE NEGATIVE Final    Comment:        The GeneXpert MRSA Assay (FDA approved for NASAL specimens only), is one component of a comprehensive MRSA colonization surveillance program. It is not intended to diagnose MRSA infection nor to guide or monitor treatment for MRSA infections.   Culture, blood (Routine X 2) w Reflex to ID Panel     Status: None (Preliminary result)   Collection Time: 05/19/15  9:17 PM  Result Value Ref Range Status   Specimen Description BLOOD LEFT HAND  Final   Special Requests IN PEDIATRIC BOTTLE 1 CC  Final   Culture   Final    NO GROWTH < 24 HOURS Performed at [facility]    Report Status PENDING  Incomplete  Culture, blood (Routine X 2) w Reflex to ID Panel     Status: None (Preliminary result)   Collection Time: 05/19/15  9:22 PM  Result Value Ref Range Status   Specimen Description BLOOD LEFT ANTECUBITAL  Final   Special Requests IN PEDIATRIC BOTTLE 1 ML  Final   Culture   Final    NO GROWTH < 24  HOURS Performed at [facility]    Report Status PENDING  Incomplete         Radiology Studies: Dg Chest Port 1 View  05/19/2015  CLINICAL DATA:  Right-sided chest pain, shortness of breath, dry cough for the past 3 weeks. History of CHF. EXAM: PORTABLE CHEST 1 VIEW COMPARISON:  05/12/2015; 03/08/2014; 03/05/2014; 02/25/2013 FINDINGS: Grossly unchanged enlarged cardiac silhouette and mediastinal contours. Interval placement of right upper extremity approach PICC line with tip projected over the superior cavoatrial junction. Interval development of left basilar potential airspace opacities with development of ill-defined heterogeneous slightly nodular interstitial opacities within the peripheral aspect of the left lung. No pneumothorax. Unchanged bones. IMPRESSION: 1. Worsening ill-defined nodular interstitial opacities within the left lung with  relative area of consolidation within the left lower lung - findings worrisome for multifocal infection, including potential atypical etiologies. 2. Right upper extremity approach PICC line tip terminates over the superior cavoatrial junction. Electronically Signed   By: Simonne Come M.D.   On: 05/19/2015 18:47        Scheduled Meds: . allopurinol  200 mg Oral Daily  . arformoterol  15 mcg Nebulization BID  . aspirin  81 mg Oral q morning - 10a  . budesonide (PULMICORT) nebulizer solution  0.25 mg Nebulization BID  . calcitRIOL  0.25 mcg Oral Daily  . carvedilol  6.25 mg Oral BID WC  . ceFEPime (MAXIPIME) IV  500 mg Intravenous Q24H  . cyclobenzaprine  5 mg Oral QHS  . feeding supplement (ENSURE ENLIVE)  237 mL Oral BID BM  . furosemide  20 mg Oral Daily  . guaiFENesin  600 mg Oral BID  . heparin  5,000 Units Subcutaneous 3 times per day  . isosorbide-hydrALAZINE  1 tablet Oral TID  . lacosamide  150 mg Oral BID  . levETIRAcetam  500 mg Oral QHS  . sodium chloride flush  10-40 mL Intracatheter Q12H  . vancomycin  500 mg Intravenous  Q24H   Continuous Infusions:    LOS: 7 days    Time spent: 35 minutes    THOMPSON,DANIEL, MD [facility] Pager [phone]  If 7PM-7AM, please contact night-coverage [email or web address] Password [email or web address] 05/21/2015, 1:16 PM

## 2015-05-21 NOTE — Clinical Social Work Placement (Signed)
   CLINICAL SOCIAL WORK PLACEMENT  NOTE  Date:  05/21/2015  Patient Details  Name: [name] MRN: [id number] Date of Birth: Mar 10, 1940  Clinical Social Work is seeking post-discharge placement for this patient at the Skilled  Nursing Facility level of care (*CSW will initial, date and re-position this form in  chart as items are completed):  Yes   Patient/family provided with [place] Clinical Social Work Department's list of facilities offering this level of care within the geographic area requested by the patient (or if unable, by the patient's family).  Yes   Patient/family informed of their freedom to choose among providers that offer the needed level of care, that participate in Medicare, Medicaid or managed care program needed by the patient, have an available bed and are willing to accept the patient.  Yes   Patient/family informed of [facility]'s ownership interest in [facility] and [facility], as well as of the fact that they are under no obligation to receive care at these facilities.  PASRR submitted to EDS on 05/20/15     PASRR number received on 05/20/15     Existing PASRR number confirmed on       FL2 transmitted to all facilities in geographic area requested by pt/family on 05/20/15     FL2 transmitted to all facilities within larger geographic area on       Patient informed that his/her managed care company has contracts with or will negotiate with certain facilities, including the following:        Yes   Patient/family informed of bed offers received.  Patient chooses bed at [facility]     Physician recommends and patient chooses bed at      Patient to be transferred to [facility] on  .  Patient to be transferred to facility by       Patient family notified on   of transfer.  Name of family member notified:        PHYSICIAN Please sign FL2     Additional Comment:    _______________________________________________ Orson Eva, LCSW 05/21/2015, 5:28 PM

## 2015-05-21 NOTE — Progress Notes (Addendum)
Patient Name: [name] Date of Encounter: 05/21/2015  Principal Problem:   Acute combined systolic and diastolic congestive heart failure (HCC) Active Problems:   CAP (community acquired pneumonia)   Hypertensive heart and kidney disease   Protein-calorie malnutrition, severe- wgt 86 lbs   Acute on chronic renal insufficiency (HCC)   Metabolic encephalopathy   Hyperammonemia (HCC)   NICM (nonischemic cardiomyopathy) -EF 25%   Anemia   Diastolic dysfunction-grade 3   Generalized seizure (HCC)   H/O: CVA (cerebrovascular accident)   Hyperkalemia   Chest pain   Troponin level elevated   No significant CAD at cath May 2012   Primary Cardiologist: new, Dr Tenny Craw  Patient Profile: 75 yo female w/ hx HTN, CVA, no significant CAD in 2012 at cath, CHF (managed by primary MD). Admitted 04/13 with weakness and CP. Worsening CHF, ARI, abnl LFTs. Echo 4/14 showed grade 3 DD and EF of 25%. Improved CHF with Milrinone.   SUBJECTIVE: Pt up in bed. She denies any pain or SOB.  OBJECTIVE Filed Vitals:   05/20/15 1404 05/20/15 2132 05/20/15 2213 05/21/15 0542  BP: 115/74  120/72 126/83  Pulse: 106  105 115  Temp: 98 F (36.7 C)  98.3 F (36.8 C) 98.3 F (36.8 C)  TempSrc: Oral  Oral Oral  Resp: 20  18 16   Height:      Weight:    86 lb 3.2 oz (39.1 kg)  SpO2: 100% 99% 100% 100%    Intake/Output Summary (Last 24 hours) at 05/21/15 0810 Last data filed at 05/21/15 0000  Gross per 24 hour  Intake    270 ml  Output    150 ml  Net    120 ml   Filed Weights   05/19/15 0439 05/20/15 0548 05/21/15 0542  Weight: 83 lb 12.4 oz (38 kg) 84 lb 1.6 oz (38.148 kg) 86 lb 3.2 oz (39.1 kg)    PHYSICAL EXAM General: Cachectic AA female in no acute distress. Head: Normocephalic, atraumatic.  Neck: Supple without bruits, JVD 12 cm. Lungs:  Resp regular and unlabored, decreased BS bases, few crackles Lt base Heart: RRR, S1, S2, no S3, S4, 2/6 murmur; no rub. Abdomen: Soft, non-tender,  non-distended, BS + x 4.  Extremities: No clubbing, cyanosis, edema.  Neuro: Alert and oriented X 2. Moves all extremities spontaneously.   LABS: CBC:  Recent Labs  05/20/15 0450 05/21/15 0500  WBC 10.1 8.6  HGB 8.8* 8.8*  HCT 25.5* 26.3*  MCV 97.0 95.3  PLT 315 330   Basic Metabolic Panel:  Recent Labs  41/28/78 0450 05/21/15 0500  NA 138 142  K 4.8 4.7  CL 108 107  CO2 19* 22  GLUCOSE 119* 114*  BUN 28* 28*  CREATININE 1.53* 1.64*  CALCIUM 9.4 9.3   BNP:  B NATRIURETIC PEPTIDE  Date/Time Value Ref Range Status  05/14/2015 04:19 AM >4500.0* 0.0 - 100.0 pg/mL Final    TELE:  Generally ST with IVCD  Echo: 05/15/15 Study Conclusions  - Left ventricle: The cavity size was normal. Wall thickness was  increased in a pattern of mild LVH. Systolic function was  severely reduced. The estimated ejection fraction was in the  range of 25% to 30%. Doppler parameters are consistent with a  reversible restrictive pattern, indicative of decreased left  ventricular diastolic compliance and/or increased left atrial  pressure (grade 3 diastolic dysfunction). - Ventricular septum: The contour showed diastolic flattening. - Aortic valve: There was trivial regurgitation. -  Mitral valve: There was mild regurgitation. - Right ventricle: The cavity size was severely dilated. Systolic  function was moderately to severely reduced. - Right atrium: The atrium was moderately dilated. - Tricuspid valve: There was wide-open regurgitation.  Impressions:  - The LV systolic and diastolic function are severely reduced.  The TV has wide open TR.  The RV / RA gradient was measured only at 15 ( giving an  estimated PA pressure of 30)  The RV / PA pressure appears to be higher than that - the RV is  enlarged . There is flattening of the ventricular septum in  diastole.   Current Medications:  . allopurinol  200 mg Oral Daily  . arformoterol  15 mcg Nebulization BID  .  aspirin  81 mg Oral q morning - 10a  . budesonide (PULMICORT) nebulizer solution  0.25 mg Nebulization BID  . calcitRIOL  0.25 mcg Oral Daily  . carvedilol  6.25 mg Oral BID WC  . ceFEPime (MAXIPIME) IV  500 mg Intravenous Q24H  . cyclobenzaprine  5 mg Oral QHS  . feeding supplement (ENSURE ENLIVE)  237 mL Oral BID BM  . furosemide  20 mg Oral Daily  . guaiFENesin  600 mg Oral BID  . heparin  5,000 Units Subcutaneous 3 times per day  . isosorbide-hydrALAZINE  1 tablet Oral TID  . lacosamide  150 mg Oral BID  . lactulose  20 g Oral BID  . levETIRAcetam  500 mg Oral QHS  . sodium chloride flush  10-40 mL Intracatheter Q12H  . vancomycin  500 mg Intravenous Q24H      ASSESSMENT AND PLAN:  1. Acute on chronic combined systolic and diastolic CHF (congestive heart failure) (HCC) - echo with severe biventricular failure. 48 hrs of Milrinone - got 2 doses on Lasix 40 mg IV on 04/13, now on 20 mg PO daily - now off Milrinone   Otherwise, per IM Principal Problem:   Acute combined systolic and diastolic congestive heart failure (HCC) Active Problems:   CAP (community acquired pneumonia)   Hypertensive heart and kidney disease   Protein-calorie malnutrition, severe- wgt 86 lbs   Acute on chronic renal insufficiency (HCC)   Metabolic encephalopathy   Hyperammonemia (HCC)   NICM (nonischemic cardiomyopathy) -EF 25%   Anemia   Diastolic dysfunction-grade 3   Generalized seizure (HCC)   H/O: CVA (cerebrovascular accident)   Hyperkalemia   Chest pain   Troponin level elevated   No significant CAD at cath May 2012  Plan: It appears her admission may have been more from low output as opposed to volume overload. Coreg increased yesterday, BiDil increased yesterday. No significant wgt change or diuresis since admission. I spoke with pt's son [name] on the phone.   Signed, Abelino Derrick , PA-C 8:10 AM 05/21/2015 The patient has been seen in conjunction with Corine Shelter, PAC. All  aspects of care have been considered and discussed. The patient has been personally interviewed, examined, and all clinical data has been reviewed.   Now attempting to optimize medical therapy. Will increase furosemide to 40 mg daily.  Monitor renal function.  Overall, CV prognosis is very poor and Palliative care involvement should be considered.  Follow-up will be with Dr. Lewayne Bunting, M.D as he was the initial cardiology contact with practice.Marland Kitchen

## 2015-05-21 NOTE — Progress Notes (Signed)
CSW continuing to follow.   CSW met with pt and pt son at bedside.   CSW provided SNF bed offers. Pt and pt son choose [facility].   CSW contacted [facility] and notified facility of acceptance of bed offer. [facility] [place] confirmed facility can accept pt when medically ready.  CSW to continue to follow to provide support and assist with pt disposition needs.   Alison Murray, MSW, [place] [facility] [phone]

## 2015-05-22 DIAGNOSIS — R059 Cough, unspecified: Secondary | ICD-10-CM | POA: Insufficient documentation

## 2015-05-22 DIAGNOSIS — I519 Heart disease, unspecified: Secondary | ICD-10-CM

## 2015-05-22 DIAGNOSIS — R05 Cough: Secondary | ICD-10-CM

## 2015-05-22 DIAGNOSIS — Z7189 Other specified counseling: Secondary | ICD-10-CM | POA: Insufficient documentation

## 2015-05-22 DIAGNOSIS — Z515 Encounter for palliative care: Secondary | ICD-10-CM

## 2015-05-22 LAB — CARBOXYHEMOGLOBIN
Carboxyhemoglobin: 1.6 % — ABNORMAL HIGH (ref 0.5–1.5)
Methemoglobin: 0.7 % (ref 0.0–1.5)
O2 Saturation: 56.5 %
Total hemoglobin: 9.2 g/dL — ABNORMAL LOW (ref 12.0–16.0)

## 2015-05-22 LAB — BASIC METABOLIC PANEL
Anion gap: 7 (ref 5–15)
BUN: 42 mg/dL — ABNORMAL HIGH (ref 6–20)
CO2: 21 mmol/L — ABNORMAL LOW (ref 22–32)
Calcium: 9.2 mg/dL (ref 8.9–10.3)
Chloride: 110 mmol/L (ref 101–111)
Creatinine, Ser: 2.02 mg/dL — ABNORMAL HIGH (ref 0.44–1.00)
GFR calc Af Amer: 27 mL/min — ABNORMAL LOW (ref >60)
GFR calc non Af Amer: 23 mL/min — ABNORMAL LOW (ref >60)
Glucose, Bld: 105 mg/dL — ABNORMAL HIGH (ref 65–99)
Potassium: 5 mmol/L (ref 3.5–5.1)
Sodium: 138 mmol/L (ref 135–145)

## 2015-05-22 LAB — CBC
HCT: 25 % — ABNORMAL LOW (ref 36.0–46.0)
Hemoglobin: 8.6 g/dL — ABNORMAL LOW (ref 12.0–15.0)
MCH: 32.8 pg (ref 26.0–34.0)
MCHC: 34.4 g/dL (ref 30.0–36.0)
MCV: 95.4 fL (ref 78.0–100.0)
Platelets: 331 10*3/uL (ref 150–400)
RBC: 2.62 MIL/uL — ABNORMAL LOW (ref 3.87–5.11)
RDW: 19.8 % — ABNORMAL HIGH (ref 11.5–15.5)
WBC: 7.9 10*3/uL (ref 4.0–10.5)

## 2015-05-22 LAB — MAGNESIUM: Magnesium: 2.3 mg/dL (ref 1.7–2.4)

## 2015-05-22 MED ORDER — AMOXICILLIN-POT CLAVULANATE 875-125 MG PO TABS
1.0000 | ORAL_TABLET | Freq: Two times a day (BID) | ORAL | Status: DC
  Administered 2015-05-22 – 2015-05-23 (×2): 1 via ORAL
  Filled 2015-05-22 (×2): qty 1

## 2015-05-22 NOTE — Progress Notes (Signed)
Physical Therapy Treatment Patient Details Name: Crislyn Froehle MRN: [id number] DOB: July 09, 1940 Today's Date: 05/22/2015    History of Present Illness 33 y,o. female with h/o CVA, HTN, CHF, seizure admitted with CHF, encephalopathy.     PT Comments    Assisted with amb in hallway a limited distance using walker.  Avg HR 112.  Unsteady gait.  Pt will need ST Rehab at SNF.   Follow Up Recommendations  SNF     Equipment Recommendations       Recommendations for Other Services       Precautions / Restrictions Precautions Precautions: Fall Precaution Comments: pt reports 3 falls recently Restrictions Weight Bearing Restrictions: No    Mobility  Bed Mobility Overal bed mobility: Needs Assistance Bed Mobility: Sit to Supine     Supine to sit: Supervision     General bed mobility comments: pt able to perform self with increased time  Transfers Overall transfer level: Needs assistance Equipment used: None Transfers: Sit to/from Stand Sit to Stand: Min guard;Min assist            Ambulation/Gait Ambulation/Gait assistance: Min assist Ambulation Distance (Feet): 45 Feet Assistive device: Rolling walker (2 wheeled) Gait Pattern/deviations: Step-to pattern;Step-through pattern Gait velocity: decreased    General Gait Details: pt agreed to use walker this time.  Unsteady gait and limited activity tolerance.     Stairs            Wheelchair Mobility    Modified Rankin (Stroke Patients Only)       Balance                                    Cognition Arousal/Alertness: Awake/alert Behavior During Therapy: WFL for tasks assessed/performed Overall Cognitive Status: Within Functional Limits for tasks assessed                      Exercises      General Comments        Pertinent Vitals/Pain Pain Assessment: No/denies pain    Home Living                      Prior Function            PT Goals (current goals  can now be found in the care plan section) Progress towards PT goals: Progressing toward goals    Frequency  Min 3X/week    PT Plan Current plan remains appropriate    Co-evaluation             End of Session Equipment Utilized During Treatment: Gait belt Activity Tolerance: Patient tolerated treatment well;No increased pain Patient left: in bed;with call bell/phone within reach     Time: 1345-1400 PT Time Calculation (min) (ACUTE ONLY): 15 min  Charges:  $Gait Training: 8-22 mins                    G Codes:      Felecia Shelling  PTA [facility]  Acute  Rehab Pager      [phone]

## 2015-05-22 NOTE — Consult Note (Signed)
Consultation Note Date: 05/22/2015   Patient Name: [name]  DOB: 07-13-1940  MRN: [id number]  Age / Sex: 75 y.o., female  PCP: [name], MD Referring Physician: Rodolph Bong, MD  Reason for Consultation: Establishing goals of care    Clinical Assessment/Narrative:  75 year old female w/ hx HTN, CVA, no significant CAD in 2012 at cath, CHF (managed by primary MD).who presented with 1-2 week history of shortness of breath and dyspnea on exertion with associated dizziness. The patient stated that in the past 3 days prior to admission, she has had a worsening of her symptoms with intermittent chest discomfort. She denied any fevers, chills, nausea, vomiting, diarrhea, syncope, mechanical falls. She stated that her primary care physician recently contacted her and told her to stop her furosemide. She had been off of for furosemide for slightly over one week prior to this admission.   Initial workup revealed that the patient had small bilateral pleural effusions and decompensated CHF. The patient was started on intravenous furosemide with good clinical improvement.  Furosemide had been stopped due to significant elevation of her serum creatinine. Cardiology was consulted due to worsening echocardiographic findings with EF 25-30 percent and wide open TR. The patient was subsequently placed on a milrinone drip by cardiology. Milirone drip has been discontinued and patient on oral lasix. CXR 05/19/2015 c/w PNA. Patient on empiric IV antibiotics.   Cardiology has been following, patient on coreg, ASA, BiDil, Lasix. A palliative consult has been placed for goals of care discussions.   The patient is an age appropriate lady resting in bed, she is in no distress. She denies chest pain, denies any dyspnea. No nausea no vomiting, no problems with constipation or diarrhea.   Introduced palliative care. Goals of care  discussions undertaken. Patient elects her son Ridley Spira to be her HCPOA agent [phone]. She has 3 kids, 2 sons and a daughter. She is aware that she is going to [facility] on D/C for rehab. Recommend palliative follow up over there.   She elects continuation of FULL code status for now.   Call placed and discussed with patient's son, discussed patient's acute combined systolic diastolic CHF, rising creatinine levels, recent HcAP, AKI etc. Son elects for full code and d/c to [facility] for rehab on d/c.   Briefly discussed CHF trajectory with patient and son over the phone. Discussed about keeping track of her symptoms and future hospitalizations to determine hospice eligibility in the future. Estimate likely prognosis of 12 months. No change in goals as of now. Thank you for the consult.    Contacts/Participants in Discussion: Primary Decision Maker:     Relationship to Patient   HCPOA: yes   Son   SUMMARY OF RECOMMENDATIONS: Full code [facility] for rehab with palliative to follow over there.  Continue appropriate medical management for CHF acute combined systolic and diastolic.  Monitor disease trajectory of CHF and stage III CKD via palliative follow up at [facility] place.    Code Status/Advance Care Planning: Full code    Code Status Orders        Start     Ordered   05/14/15 0906  Full code   Continuous     05/14/15 0905    Code Status History    Date Active Date Inactive Code Status Order ID Comments User Context   03/05/2014  9:32 PM 03/09/2014  4:18 PM Full Code [id number]  Lynden Oxford, MD ED   02/24/2013  9:37 AM  02/28/2013  5:17 PM Full Code [id number]  Cleora Fleet, MD Inpatient   02/12/2013 12:34 AM 02/14/2013  9:32 PM Full Code [id number]  Therisa Doyne, MD Inpatient   01/04/2013  4:47 PM 01/15/2013  4:48 PM Full Code [id number]  Jacquelynn Cree, PA-C Inpatient   12/28/2012  1:41 PM 01/04/2013  4:47 PM Full Code [id number]  Alba Cory, MD Inpatient       Other Directives:Advanced Directive  Symptom Management:    as above   Palliative Prophylaxis:   Bowel Regimen  Additional Recommendations (Limitations, Scope, Preferences):  Full Scope Treatment  Psycho-social/Spiritual:  Support System: Fair Desire for further Chaplaincy support:no Additional Recommendations: Caregiving  Support/Resources  Prognosis: Unable to determine ? Less than 12 months, discussed with son over the phone.   Discharge Planning: Skilled Nursing Facility for rehab with Palliative care service follow-up   Chief Complaint/ Primary Diagnoses: Present on Admission:  . Chest pain . Acute combined systolic and diastolic congestive heart failure (HCC) . Hypertensive heart and kidney disease . Hyperkalemia . Protein-calorie malnutrition, severe- wgt 86 lbs . Troponin level elevated . Metabolic encephalopathy . Hyperammonemia (HCC) . Anemia . Diastolic dysfunction-grade 3 . CAP (community acquired pneumonia)  I have reviewed the medical record, interviewed the patient and family, and examined the patient. The following aspects are pertinent.  Past Medical History  Diagnosis Date  . MI (myocardial infarction) (HCC)   . Hypertension   . Stroke (HCC)   . Congestive heart failure (CHF) (HCC)     EF 35% Nov 2014  . Epilepsy (HCC)   . Smoker    Social History   Social History  . Marital Status: Single    Spouse Name: N/A  . Number of Children: 3  . Years of Education: 13   Occupational History  .      Retired   Social History Main Topics  . Smoking status: Current Some Day Smoker -- 0.10 packs/day    Types: Cigarettes  . Smokeless tobacco: Never Used  . Alcohol Use: 0.0 oz/week    0 Standard drinks or equivalent per week     Comment: occasional  . Drug Use: No  . Sexual Activity: No   Other Topics Concern  . None   Social History Narrative   Patient lives at home alone.    Retired.   Education one year of college.   Left  handed.   Caffeine one cup of coffee daily and one cup of tea.   Family History  Problem Relation Age of Onset  . Cancer Father   . Anemia Daughter    Scheduled Meds: . allopurinol  200 mg Oral Daily  . arformoterol  15 mcg Nebulization BID  . aspirin  81 mg Oral q morning - 10a  . budesonide (PULMICORT) nebulizer solution  0.25 mg Nebulization BID  . calcitRIOL  0.25 mcg Oral Daily  . carvedilol  6.25 mg Oral BID WC  . ceFEPime (MAXIPIME) IV  500 mg Intravenous Q24H  . cyclobenzaprine  5 mg Oral QHS  . feeding supplement (ENSURE ENLIVE)  237 mL Oral BID BM  . guaiFENesin  600 mg Oral BID  . heparin  5,000 Units Subcutaneous 3 times per day  . isosorbide-hydrALAZINE  1 tablet Oral TID  . lacosamide  150 mg Oral BID  . levETIRAcetam  500 mg Oral QHS  . sodium chloride flush  10-40 mL Intracatheter Q12H   Continuous Infusions:  PRN Meds:.acetaminophen **OR** acetaminophen, HYDROcodone-homatropine, ipratropium,  levalbuterol, polyethylene glycol, sodium chloride flush Medications Prior to Admission:  Prior to Admission medications   Medication Sig Start Date End Date Taking? Authorizing Provider  allopurinol (ZYLOPRIM) 100 MG tablet Take 200 mg by mouth daily.  07/17/14  Yes Historical Provider, MD  aspirin 325 MG tablet Take 325 mg by mouth every morning.    Yes Historical Provider, MD  calcitRIOL (ROCALTROL) 0.25 MCG capsule Take 0.25 mcg by mouth daily.   Yes Historical Provider, MD  carvedilol (COREG) 6.25 MG tablet Take 1 tablet (6.25 mg total) by mouth 2 (two) times daily with a meal. 03/09/14  Yes Ripudeep K Rai, MD  colchicine (COLCRYS) 0.6 MG tablet Take 0.6 mg by mouth daily.   Yes Historical Provider, MD  cyclobenzaprine (FLEXERIL) 5 MG tablet Take 5 mg by mouth at bedtime.   Yes Historical Provider, MD  levETIRAcetam (KEPPRA) 500 MG tablet Take 1 tablet (500 mg total) by mouth at bedtime. 08/21/14  Yes Butch Penny, NP  VIMPAT 100 MG TABS Take 1.5 tablets (150 mg total) by  mouth 2 (two) times daily. 02/24/15  Yes Butch Penny, NP  azithromycin (ZITHROMAX Z-PAK) 250 MG tablet Take 1 tablet (250 mg total) by mouth daily. 05/14/15   Elpidio Anis, PA-C  furosemide (LASIX) 20 MG tablet Take 2 tablets (40 mg total) by mouth daily. Patient not taking: Reported on 05/14/2015 03/10/14   Ripudeep Jenna Luo, MD  ondansetron (ZOFRAN) 4 MG tablet Take 1 tablet (4 mg total) by mouth every 6 (six) hours. 05/14/15   Elpidio Anis, PA-C   No Known Allergies  Review of Systems - for chest pain, - for dyspnea.   Physical Exam Thin frail elderly appearing lady S1 S2 Abdomen soft Regular No edema Non focal  Vital Signs: BP 118/68 mmHg  Pulse 103  Temp(Src) 97.4 F (36.3 C) (Oral)  Resp 19  Ht 5\' 2"  (1.575 m)  Wt 38.465 kg (84 lb 12.8 oz)  BMI 15.51 kg/m2  SpO2 99%  SpO2: SpO2: 99 % O2 Device:SpO2: 99 % O2 Flow Rate: .O2 Flow Rate (L/min): 8 L/min  IO: Intake/output summary:  Intake/Output Summary (Last 24 hours) at 05/22/15 1520 Last data filed at 05/22/15 1100  Gross per 24 hour  Intake    300 ml  Output   1251 ml  Net   -951 ml    LBM: Last BM Date: 05/21/15 Baseline Weight: Weight: 39.2 kg (86 lb 6.7 oz) Most recent weight: Weight: 38.465 kg (84 lb 12.8 oz)      Palliative Assessment/Data:  Flowsheet Rows        Most Recent Value   Intake Tab    Referral Department  Hospitalist   Unit at Time of Referral  Med/Surg Unit   Palliative Care Primary Diagnosis  Cardiac   Palliative Care Type  New Palliative care   Reason for referral  Clarify Goals of Care   Date first seen by Palliative Care  05/22/15   Clinical Assessment    Palliative Performance Scale Score  40%   Pain Max last 24 hours  3   Pain Min Last 24 hours  2   Dyspnea Max Last 24 Hours  2   Dyspnea Min Last 24 hours  1   Psychosocial & Spiritual Assessment    Palliative Care Outcomes    Patient/Family meeting held?  Yes   Who was at the meeting?  patient, son over the phone.     Palliative Care Outcomes  Clarified goals of  care, Counseled regarding hospice   Palliative Care follow-up planned  Yes, Facility      Additional Data Reviewed:  CBC:    Component Value Date/Time   WBC 7.9 05/22/2015 0533   HGB 8.6* 05/22/2015 0533   HCT 25.0* 05/22/2015 0533   HCT 27.5* 05/16/2015 1125   PLT 331 05/22/2015 0533   MCV 95.4 05/22/2015 0533   NEUTROABS 7.4 05/14/2015 0356   LYMPHSABS 1.6 05/14/2015 0356   MONOABS 0.6 05/14/2015 0356   EOSABS 0.0 05/14/2015 0356   BASOSABS 0.0 05/14/2015 0356   Comprehensive Metabolic Panel:    Component Value Date/Time   NA 138 05/22/2015 0533   K 5.0 05/22/2015 0533   CL 110 05/22/2015 0533   CO2 21* 05/22/2015 0533   BUN 42* 05/22/2015 0533   CREATININE 2.02* 05/22/2015 0533   GLUCOSE 105* 05/22/2015 0533   CALCIUM 9.2 05/22/2015 0533   AST 85* 05/14/2015 0356   ALT 74* 05/14/2015 0356   ALKPHOS 158* 05/14/2015 0356   BILITOT 1.0 05/14/2015 0356   PROT 7.9 05/14/2015 0356   ALBUMIN 4.3 05/14/2015 0356     Time In: 1400 Time Out: 1500 Time Total: 60 Greater than 50%  of this time was spent counseling and coordinating care related to the above assessment and plan.  Signed by: Rosalin Hawking, MD [id number] Rosalin Hawking, MD  05/22/2015, 3:20 PM  Please contact Palliative Medicine Team phone at [phone] for questions and concerns.

## 2015-05-22 NOTE — Progress Notes (Signed)
Pharmacy Antibiotic Note  [name] is a 75 y.o. female admitted on 05/14/2015 with acute on chronic systolic and diastolic CHF. CXR obtained 05/19/2015 due to worsening cough and shortness of breath which was concerning for pneumonia. Pharmacy has been consulted for cefepime dosing for HAP.  Day #4 antibiotics  Plan: Cefepime 500mg  IV q24h - possible change to PO antibiotics today, suggest augmentin 250mg  PO BID for low TBW and CrCl Monitor renal function & adjust doses as needed Follow up cultures and de-escalate as appropriate  Height: 5\' 2"  (157.5 cm) Weight: 84 lb 12.8 oz (38.465 kg) IBW/kg (Calculated) : 50.1  Temp (24hrs), Avg:97.8 F (36.6 C), Min:97.4 F (36.3 C), Max:98.2 F (36.8 C)   Recent Labs Lab 05/18/15 0300 05/19/15 0518 05/20/15 0450 05/21/15 0500 05/22/15 0533  WBC  --   --  10.1 8.6 7.9  CREATININE 2.07* 1.58* 1.53* 1.64* 2.02*    Estimated Creatinine Clearance: 15.3 mL/min (by C-G formula based on Cr of 2.02).    No Known Allergies  Antimicrobials this admission: 4/13 Rocephin/Zithro x 1 dose 4/18 >> Vancomycin >> 4/18 >> Cefepime >>  Dose adjustments this admission: ---  Microbiology results: 4/13 UCx: multiple sp-final 4/15 UCx: multiple sp-finl 4/18 BCx: ngtd 4/18 MRSA PCR: neg  Thank you for allowing pharmacy to be a part of this patient's care.  Juliette Alcide, PharmD, BCPS.   Pager: [phone] 05/22/2015 2:23 PM

## 2015-05-22 NOTE — Progress Notes (Signed)
PROGRESS NOTE    [name]  BMS:[id number] DOB: 05-21-1940 DOA: 05/14/2015 PCP: [name], MD (Confirm with patient/family/NH records and if not entered, this HAS to be entered at [facility] point of entry. "No PCP" if truly none.) Outpatient Specialists: Consulting civil engineer speciality and name if known)    Brief Narrative:  75 year old female who presented with 1-2 week history of shortness of breath and dyspnea on exertion with associated dizziness. The patient stated that in the past 3 days prior to admission, she has had a worsening of her symptoms with intermittent chest discomfort. She denied any fevers, chills, nausea, vomiting, diarrhea, syncope, mechanical falls. She stated that her primary care physician recently contacted her and told her to stop her furosemide. She had been off of for furosemide for slightly over one week prior to this admission. Initial workup revealed that the patient had small bilateral pleural effusions and decompensated CHF. The patient was started on intravenous furosemide with good clinical improvement. Furosemide had been stopped due to significant elevation of her serum creatinine. Cardiology was consulted due to worsening echocardiographic findings with EF 25-30 percent and wide open TR. The patient was subsequently placed on a milrinone drip by cardiology. Milirone drip has been discontinued and patient on oral lasix. CXR 05/19/2015 c/w PNA. Patient on empiric IV antibiotics.  Assessment & Plan:   Principal Problem:   Acute combined systolic and diastolic congestive heart failure (HCC) Active Problems:   Generalized seizure (HCC)   Hypertensive heart and kidney disease   H/O: CVA (cerebrovascular accident)   Hyperkalemia   Protein-calorie malnutrition, severe- wgt 86 lbs   Chest pain   Troponin level elevated   Acute on chronic renal insufficiency (HCC)   Metabolic encephalopathy   Hyperammonemia (HCC)   NICM (nonischemic cardiomyopathy) -EF 25%  No significant CAD at cath May 2012   Anemia   Diastolic dysfunction-grade 3   CAP (community acquired pneumonia)   Pneumonia  #1 acute on chronic combined systolic and diastolic heart failure Questionable etiology. Patient was on milrinone drip which has since been discontinued. Cardiac enzymes were minimally elevated at 0.08 which seemed to have plateaued and felt to be elevated secondary to CHF exacerbation. Patient's weight is 84 pounds today. 2-D echo with a EF of 25-30% with grade 3 diastolic dysfunction with wide-open tricuspid regurgitation. Patient had received 2 doses of IV Lasix and currently on oral Lasix at 20 mg daily. Continue aspirin, Coreg, BiDil. Per cardiology overall cardiovascular prognosis is very poor and cardiology recommending palliative care involvement.  Palliative care consultation pending.  #2 pneumonia/HCAP  Per chest x-ray. Patient has been started empirically on IV vancomycin and IV cefepime. Continue Mucinex. Continue nebulizer treatments, Pulmicort and Brovana. Discontinued IV vancomycin. Will transition to oral Augmentin today. Follow.  #3 acute on chronic kidney disease stage III Likely secondary to problem #1. Baseline creatinine 1.2-1.6. Renal function trending back up.  Monitor closely. Creatinine currently at 2.02.  #4 elevated troponin Likely secondary to problem #1. Troponins plateaued. Cardiology following.  #5 tachycardia Felt to be secondary to reflex tachycardia from beta blocker which was held on admission and resumed yesterday. Heart rate is increasing. Per cardiology.  #6 acute metabolic encephalopathy Multifactorial secondary to problems #1,2,3. TSH at 1.765. B-12 levels at 516. Ammonia levels have trended down from 40 to 24 on lactulose. Urinalysis negative for pyuria. Patient with clinical improvement. Follow.  #7 hyperammonemia Improved on lactulose. Discontinued lactulose.  #8 severe protein calorie malnutrition Continue nutritional  supplementation.  #9 hyperkalemia  Improved with diuresis.  #10 seizure disorder Stable. Continue Vimpat and Keppra.  #11 history of stroke Continue aspirin for secondary stroke prevention.   DVT prophylaxis: Heparin Code Status: Full Family Communication: Updated patient.  Updated son via telephone. Disposition Plan: SNF when medically stable.   Consultants:   Cardiology: Dr. Ladona Ridgel 05/16/2015  palliative care pending  Procedures:   Chest x-ray 05/12/2015, 05/17/2015, 05/19/2015  Renal ultrasound 05/16/2015  2-D echo 05/15/2015  Antimicrobials:   IV cefepime 05/19/2015  IV vancomycin 05/19/2015>>>> 05/21/2015   oral Augmentin 05/22/2015   Subjective: Patient states she's feeling better. Cough improving. Shortness of breath improvement.  Objective: Filed Vitals:   05/21/15 2152 05/22/15 0558 05/22/15 0802 05/22/15 1118  BP: 115/71 114/75 117/69   Pulse: 102 101 108   Temp: 98.2 F (36.8 C) 97.9 F (36.6 C)    TempSrc: Oral Oral    Resp:  24    Height:      Weight:  38.465 kg (84 lb 12.8 oz)    SpO2: 98% 100%  98%    Intake/Output Summary (Last 24 hours) at 05/22/15 1308 Last data filed at 05/22/15 1100  Gross per 24 hour  Intake    300 ml  Output   1253 ml  Net   -953 ml   Filed Weights   05/20/15 0548 05/21/15 0542 05/22/15 0558  Weight: 38.148 kg (84 lb 1.6 oz) 39.1 kg (86 lb 3.2 oz) 38.465 kg (84 lb 12.8 oz)    Examination:  General exam: Appears calm and comfortable  Respiratory system: Decreased breath sounds in the bases. Minimal expiratory wheezing. Cardiovascular system: Tachycardia. No JVD, murmurs, rubs, gallops or clicks. No pedal edema. Gastrointestinal system: Abdomen is nondistended, soft and nontender. No organomegaly or masses felt. Normal bowel sounds heard. Central nervous system: Alert and oriented. No focal neurological deficits. Extremities: Symmetric 5 x 5 power. Skin: No rashes, lesions or ulcers Psychiatry:  Judgement and insight appear normal. Mood & affect appropriate.     Data Reviewed: I have personally reviewed following labs and imaging studies  CBC:  Recent Labs Lab 05/16/15 1125 05/20/15 0450 05/21/15 0500 05/22/15 0533  WBC  --  10.1 8.6 7.9  HGB  --  8.8* 8.8* 8.6*  HCT 27.5* 25.5* 26.3* 25.0*  MCV  --  97.0 95.3 95.4  PLT  --  315 330 331   Basic Metabolic Panel:  Recent Labs Lab 05/18/15 0300 05/19/15 0518 05/20/15 0450 05/21/15 0500 05/22/15 0533  NA 143 140 138 142 138  K 4.7 4.9 4.8 4.7 5.0  CL 108 111 108 107 110  CO2 24 19* 19* 22 21*  GLUCOSE 104* 107* 119* 114* 105*  BUN 53* 35* 28* 28* 42*  CREATININE 2.07* 1.58* 1.53* 1.64* 2.02*  CALCIUM 9.3 9.4 9.4 9.3 9.2  MG  --   --   --   --  2.3   GFR: Estimated Creatinine Clearance: 15.3 mL/min (by C-G formula based on Cr of 2.02). Liver Function Tests: No results for input(s): AST, ALT, ALKPHOS, BILITOT, PROT, ALBUMIN in the last 168 hours. No results for input(s): LIPASE, AMYLASE in the last 168 hours.  Recent Labs Lab 05/16/15 1125 05/19/15 0720  AMMONIA 40* 24   Coagulation Profile: No results for input(s): INR, PROTIME in the last 168 hours. Cardiac Enzymes: No results for input(s): CKTOTAL, CKMB, CKMBINDEX, TROPONINI in the last 168 hours. BNP (last 3 results) No results for input(s): PROBNP in the last 8760 hours. HbA1C: No results  for input(s): HGBA1C in the last 72 hours. CBG: No results for input(s): GLUCAP in the last 168 hours. Lipid Profile: No results for input(s): CHOL, HDL, LDLCALC, TRIG, CHOLHDL, LDLDIRECT in the last 72 hours. Thyroid Function Tests: No results for input(s): TSH, T4TOTAL, FREET4, T3FREE, THYROIDAB in the last 72 hours. Anemia Panel: No results for input(s): VITAMINB12, FOLATE, FERRITIN, TIBC, IRON, RETICCTPCT in the last 72 hours. Urine analysis:    Component Value Date/Time   COLORURINE YELLOW 05/16/2015 1605   APPEARANCEUR CLEAR 05/16/2015 1605    LABSPEC 1.016 05/16/2015 1605   PHURINE 5.5 05/16/2015 1605   GLUCOSEU NEGATIVE 05/16/2015 1605   HGBUR NEGATIVE 05/16/2015 1605   BILIRUBINUR NEGATIVE 05/16/2015 1605   KETONESUR NEGATIVE 05/16/2015 1605   PROTEINUR 30* 05/16/2015 1605   UROBILINOGEN 0.2 03/05/2014 1324   NITRITE NEGATIVE 05/16/2015 1605   LEUKOCYTESUR SMALL* 05/16/2015 1605   Sepsis Labs: @LABRCNTIP (procalcitonin:4,lacticidven:4)  ) Recent Results (from the past 240 hour(s))  Urine culture     Status: None   Collection Time: 05/14/15  3:26 AM  Result Value Ref Range Status   Specimen Description URINE, CLEAN CATCH  Final   Special Requests NONE  Final   Culture MULTIPLE SPECIES PRESENT, SUGGEST RECOLLECTION  Final   Report Status 05/15/2015 FINAL  Final  Urine culture     Status: None   Collection Time: 05/16/15  4:05 PM  Result Value Ref Range Status   Specimen Description URINE, CLEAN CATCH  Final   Special Requests NONE  Final   Culture MULTIPLE SPECIES PRESENT, SUGGEST RECOLLECTION  Final   Report Status 05/18/2015 FINAL  Final  MRSA PCR Screening     Status: None   Collection Time: 05/19/15  8:23 PM  Result Value Ref Range Status   MRSA by PCR NEGATIVE NEGATIVE Final    Comment:        The GeneXpert MRSA Assay (FDA approved for NASAL specimens only), is one component of a comprehensive MRSA colonization surveillance program. It is not intended to diagnose MRSA infection nor to guide or monitor treatment for MRSA infections.   Culture, blood (Routine X 2) w Reflex to ID Panel     Status: None (Preliminary result)   Collection Time: 05/19/15  9:17 PM  Result Value Ref Range Status   Specimen Description BLOOD LEFT HAND  Final   Special Requests IN PEDIATRIC BOTTLE 1 CC  Final   Culture   Final    NO GROWTH 2 DAYS Performed at [facility]    Report Status PENDING  Incomplete  Culture, blood (Routine X 2) w Reflex to ID Panel     Status: None (Preliminary result)   Collection Time:  05/19/15  9:22 PM  Result Value Ref Range Status   Specimen Description BLOOD LEFT ANTECUBITAL  Final   Special Requests IN PEDIATRIC BOTTLE 1 ML  Final   Culture   Final    NO GROWTH 2 DAYS Performed at [facility]    Report Status PENDING  Incomplete         Radiology Studies: No results found.      Scheduled Meds: . allopurinol  200 mg Oral Daily  . arformoterol  15 mcg Nebulization BID  . aspirin  81 mg Oral q morning - 10a  . budesonide (PULMICORT) nebulizer solution  0.25 mg Nebulization BID  . calcitRIOL  0.25 mcg Oral Daily  . carvedilol  6.25 mg Oral BID WC  . ceFEPime (MAXIPIME) IV  500  mg Intravenous Q24H  . cyclobenzaprine  5 mg Oral QHS  . feeding supplement (ENSURE ENLIVE)  237 mL Oral BID BM  . guaiFENesin  600 mg Oral BID  . heparin  5,000 Units Subcutaneous 3 times per day  . isosorbide-hydrALAZINE  1 tablet Oral TID  . lacosamide  150 mg Oral BID  . levETIRAcetam  500 mg Oral QHS  . sodium chloride flush  10-40 mL Intracatheter Q12H   Continuous Infusions:    LOS: 8 days    Time spent: 35 minutes    Jayliana Valencia, MD [facility] Pager [phone]  If 7PM-7AM, please contact night-coverage [email or web address] Password [email or web address] 05/22/2015, 1:08 PM

## 2015-05-22 NOTE — Progress Notes (Addendum)
Patient Name: [name] Date of Encounter: 05/22/2015  Principal Problem:   Acute combined systolic and diastolic congestive heart failure (HCC) Active Problems:   CAP (community acquired pneumonia)   Hypertensive heart and kidney disease   Protein-calorie malnutrition, severe- wgt 86 lbs   Acute on chronic renal insufficiency (HCC)   Metabolic encephalopathy   Hyperammonemia (HCC)   NICM (nonischemic cardiomyopathy) -EF 25%   Anemia   Diastolic dysfunction-grade 3   Generalized seizure (HCC)   H/O: CVA (cerebrovascular accident)   Hyperkalemia   Chest pain   Troponin level elevated   No significant CAD at cath May 2012   Pneumonia   Primary Cardiologist: new, Dr Tenny Craw  Patient Profile: 75 yo female w/ hx HTN, CVA, no significant CAD in 2012 at cath, CHF (managed by primary MD). Admitted 04/13 with weakness and CP. Worsening CHF, ARI, abnl LFTs. Echo 4/14 showed grade 3 DD and EF of 25%. Improved CHF with Milrinone.   SUBJECTIVE: Pt says she slept well last night. She is flat in bed- no SOB  OBJECTIVE Filed Vitals:   05/21/15 1341 05/21/15 1731 05/21/15 2152 05/22/15 0558  BP: 123/73 131/97 115/71 114/75  Pulse: 103 111 102 101  Temp: 97.4 F (36.3 C)  98.2 F (36.8 C) 97.9 F (36.6 C)  TempSrc: Oral  Oral Oral  Resp: 18   24  Height:      Weight:    84 lb 12.8 oz (38.465 kg)  SpO2: 100%  98% 100%    Intake/Output Summary (Last 24 hours) at 05/22/15 0703 Last data filed at 05/22/15 0643  Gross per 24 hour  Intake    180 ml  Output    604 ml  Net   -424 ml   Filed Weights   05/20/15 0548 05/21/15 0542 05/22/15 0558  Weight: 84 lb 1.6 oz (38.148 kg) 86 lb 3.2 oz (39.1 kg) 84 lb 12.8 oz (38.465 kg)    PHYSICAL EXAM General: Cachectic AA female in no acute distress. Head: Normocephalic, atraumatic.  Neck: Supple without bruits, JVD 12 cm. Lungs:  Resp regular and unlabored, decreased BS bases, few crackles Lt base Heart: RRR, S1, S2, S3 gallop  noted  S4, 2/6 murmur; no rub. Abdomen: Soft, non-tender, non-distended, BS + x 4.  Extremities: No clubbing, cyanosis, edema.  Neuro: Alert and oriented X 2. Moves all extremities spontaneously.   LABS: CBC:  Recent Labs  05/21/15 0500 05/22/15 0533  WBC 8.6 7.9  HGB 8.8* 8.6*  HCT 26.3* 25.0*  MCV 95.3 95.4  PLT 330 331   Basic Metabolic Panel:  Recent Labs  42/70/62 0500 05/22/15 0533  NA 142 138  K 4.7 5.0  CL 107 110  CO2 22 21*  GLUCOSE 114* 105*  BUN 28* 42*  CREATININE 1.64* 2.02*  CALCIUM 9.3 9.2  MG  --  2.3   BNP:  B NATRIURETIC PEPTIDE  Date/Time Value Ref Range Status  05/14/2015 04:19 AM >4500.0* 0.0 - 100.0 pg/mL Final    TELE:  Generally ST with IVCD  Echo: 05/15/15 Study Conclusions  - Left ventricle: The cavity size was normal. Wall thickness was  increased in a pattern of mild LVH. Systolic function was  severely reduced. The estimated ejection fraction was in the  range of 25% to 30%. Doppler parameters are consistent with a  reversible restrictive pattern, indicative of decreased left  ventricular diastolic compliance and/or increased left atrial  pressure (grade 3 diastolic dysfunction). - Ventricular  septum: The contour showed diastolic flattening. - Aortic valve: There was trivial regurgitation. - Mitral valve: There was mild regurgitation. - Right ventricle: The cavity size was severely dilated. Systolic  function was moderately to severely reduced. - Right atrium: The atrium was moderately dilated. - Tricuspid valve: There was wide-open regurgitation.  Impressions:  - The LV systolic and diastolic function are severely reduced.  The TV has wide open TR.  The RV / RA gradient was measured only at 15 ( giving an  estimated PA pressure of 30)  The RV / PA pressure appears to be higher than that - the RV is  enlarged . There is flattening of the ventricular septum in  diastole.   Current Medications:  .  allopurinol  200 mg Oral Daily  . arformoterol  15 mcg Nebulization BID  . aspirin  81 mg Oral q morning - 10a  . budesonide (PULMICORT) nebulizer solution  0.25 mg Nebulization BID  . calcitRIOL  0.25 mcg Oral Daily  . carvedilol  6.25 mg Oral BID WC  . ceFEPime (MAXIPIME) IV  500 mg Intravenous Q24H  . cyclobenzaprine  5 mg Oral QHS  . feeding supplement (ENSURE ENLIVE)  237 mL Oral BID BM  . guaiFENesin  600 mg Oral BID  . heparin  5,000 Units Subcutaneous 3 times per day  . isosorbide-hydrALAZINE  1 tablet Oral TID  . lacosamide  150 mg Oral BID  . levETIRAcetam  500 mg Oral QHS  . sodium chloride flush  10-40 mL Intracatheter Q12H      ASSESSMENT AND PLAN:  1. Acute on chronic combined systolic and diastolic CHF (congestive heart failure) (HCC) - echo with severe biventricular failure. 48 hrs of Milrinone - got 2 doses on Lasix 40 mg IV on 04/13, now on 20 mg PO daily - now off Milrinone   Otherwise, per IM Principal Problem:   Acute combined systolic and diastolic congestive heart failure (HCC) Active Problems:   CAP (community acquired pneumonia)   Hypertensive heart and kidney disease   Protein-calorie malnutrition, severe- wgt 86 lbs   Acute on chronic renal insufficiency (HCC)   Metabolic encephalopathy   Hyperammonemia (HCC)   NICM (nonischemic cardiomyopathy) -EF 25%   Anemia   Diastolic dysfunction-grade 3   Generalized seizure (HCC)   H/O: CVA (cerebrovascular accident)   Hyperkalemia   Chest pain   Troponin level elevated   No significant CAD at cath May 2012   Pneumonia  Plan: It appears her admission may have been more from low output as opposed to volume overload.   Current medical Rx is Coreg and BiDil.   Her SCr bumped from 1.5 to 2 with addition of PO Lasix. I'm not sure if her SCr bump if from low output or if it's actually her baseline. I held her Lasix this am till this can be reviewed by Dr Katrinka Blazing.  Deland Pretty , PA-C 7:03  AM 05/22/2015  The patient has been seen in conjunction with Corine Shelter, PAC. All aspects of care have been considered and discussed. The patient has been personally interviewed, examined, and all clinical data has been reviewed.   Bump in creatinine is likely related to low output. Received Lasix 20 mg orally yesterday. No 40 mg dose given.  She will need diuretic therapy. Resume 20 mg daily.

## 2015-05-23 DIAGNOSIS — R05 Cough: Secondary | ICD-10-CM | POA: Diagnosis not present

## 2015-05-23 DIAGNOSIS — I5041 Acute combined systolic (congestive) and diastolic (congestive) heart failure: Secondary | ICD-10-CM | POA: Diagnosis not present

## 2015-05-23 DIAGNOSIS — I519 Heart disease, unspecified: Secondary | ICD-10-CM | POA: Diagnosis not present

## 2015-05-23 DIAGNOSIS — R569 Unspecified convulsions: Secondary | ICD-10-CM | POA: Diagnosis not present

## 2015-05-23 DIAGNOSIS — R4189 Other symptoms and signs involving cognitive functions and awareness: Secondary | ICD-10-CM | POA: Diagnosis not present

## 2015-05-23 DIAGNOSIS — J189 Pneumonia, unspecified organism: Secondary | ICD-10-CM | POA: Diagnosis not present

## 2015-05-23 DIAGNOSIS — N183 Chronic kidney disease, stage 3 (moderate): Secondary | ICD-10-CM | POA: Diagnosis not present

## 2015-05-23 DIAGNOSIS — Z8673 Personal history of transient ischemic attack (TIA), and cerebral infarction without residual deficits: Secondary | ICD-10-CM | POA: Diagnosis not present

## 2015-05-23 DIAGNOSIS — R1310 Dysphagia, unspecified: Secondary | ICD-10-CM | POA: Diagnosis not present

## 2015-05-23 DIAGNOSIS — M6281 Muscle weakness (generalized): Secondary | ICD-10-CM | POA: Diagnosis not present

## 2015-05-23 DIAGNOSIS — D500 Iron deficiency anemia secondary to blood loss (chronic): Secondary | ICD-10-CM | POA: Diagnosis not present

## 2015-05-23 DIAGNOSIS — M109 Gout, unspecified: Secondary | ICD-10-CM | POA: Diagnosis not present

## 2015-05-23 DIAGNOSIS — D638 Anemia in other chronic diseases classified elsewhere: Secondary | ICD-10-CM | POA: Diagnosis not present

## 2015-05-23 DIAGNOSIS — Z5189 Encounter for other specified aftercare: Secondary | ICD-10-CM | POA: Diagnosis not present

## 2015-05-23 DIAGNOSIS — N289 Disorder of kidney and ureter, unspecified: Secondary | ICD-10-CM | POA: Diagnosis not present

## 2015-05-23 DIAGNOSIS — I130 Hypertensive heart and chronic kidney disease with heart failure and stage 1 through stage 4 chronic kidney disease, or unspecified chronic kidney disease: Secondary | ICD-10-CM | POA: Diagnosis not present

## 2015-05-23 DIAGNOSIS — I5042 Chronic combined systolic (congestive) and diastolic (congestive) heart failure: Secondary | ICD-10-CM | POA: Diagnosis not present

## 2015-05-23 DIAGNOSIS — R109 Unspecified abdominal pain: Secondary | ICD-10-CM | POA: Diagnosis not present

## 2015-05-23 DIAGNOSIS — E46 Unspecified protein-calorie malnutrition: Secondary | ICD-10-CM | POA: Diagnosis not present

## 2015-05-23 DIAGNOSIS — R262 Difficulty in walking, not elsewhere classified: Secondary | ICD-10-CM | POA: Diagnosis not present

## 2015-05-23 DIAGNOSIS — M1A9XX0 Chronic gout, unspecified, without tophus (tophi): Secondary | ICD-10-CM | POA: Diagnosis not present

## 2015-05-23 DIAGNOSIS — N179 Acute kidney failure, unspecified: Secondary | ICD-10-CM | POA: Diagnosis not present

## 2015-05-23 DIAGNOSIS — I10 Essential (primary) hypertension: Secondary | ICD-10-CM | POA: Diagnosis not present

## 2015-05-23 DIAGNOSIS — R079 Chest pain, unspecified: Secondary | ICD-10-CM | POA: Diagnosis not present

## 2015-05-23 DIAGNOSIS — I25119 Atherosclerotic heart disease of native coronary artery with unspecified angina pectoris: Secondary | ICD-10-CM | POA: Diagnosis not present

## 2015-05-23 DIAGNOSIS — R5381 Other malaise: Secondary | ICD-10-CM | POA: Diagnosis not present

## 2015-05-23 DIAGNOSIS — I509 Heart failure, unspecified: Secondary | ICD-10-CM | POA: Diagnosis not present

## 2015-05-23 DIAGNOSIS — N189 Chronic kidney disease, unspecified: Secondary | ICD-10-CM | POA: Diagnosis not present

## 2015-05-23 LAB — BASIC METABOLIC PANEL
Anion gap: 9 (ref 5–15)
BUN: 40 mg/dL — AB (ref 4–21)
BUN: 40 mg/dL — ABNORMAL HIGH (ref 6–20)
CO2: 19 mmol/L — ABNORMAL LOW (ref 22–32)
Calcium: 8.9 mg/dL (ref 8.9–10.3)
Chloride: 107 mmol/L (ref 101–111)
Creatinine, Ser: 2.02 mg/dL — ABNORMAL HIGH (ref 0.44–1.00)
Creatinine: 2 mg/dL — AB (ref 0.5–1.1)
GFR calc Af Amer: 27 mL/min — ABNORMAL LOW (ref >60)
GFR calc non Af Amer: 23 mL/min — ABNORMAL LOW (ref >60)
Glucose, Bld: 98 mg/dL (ref 65–99)
Glucose: 98 mg/dL
Potassium: 4.6 mmol/L (ref 3.5–5.1)
Sodium: 135 mmol/L (ref 135–145)
Sodium: 135 mmol/L — AB (ref 137–147)

## 2015-05-23 LAB — CBC AND DIFFERENTIAL: WBC: 7.8 10^3/mL

## 2015-05-23 LAB — CBC
HCT: 24.4 % — ABNORMAL LOW (ref 36.0–46.0)
Hemoglobin: 8.3 g/dL — ABNORMAL LOW (ref 12.0–15.0)
MCH: 32.8 pg (ref 26.0–34.0)
MCHC: 34 g/dL (ref 30.0–36.0)
MCV: 96.4 fL (ref 78.0–100.0)
Platelets: 343 10*3/uL (ref 150–400)
RBC: 2.53 MIL/uL — ABNORMAL LOW (ref 3.87–5.11)
RDW: 20.2 % — ABNORMAL HIGH (ref 11.5–15.5)
WBC: 7.8 10*3/uL (ref 4.0–10.5)

## 2015-05-23 LAB — CARBOXYHEMOGLOBIN
Carboxyhemoglobin: 1.6 % — ABNORMAL HIGH (ref 0.5–1.5)
Methemoglobin: 0.7 % (ref 0.0–1.5)
O2 Saturation: 63.2 %
Total hemoglobin: 8.3 g/dL — ABNORMAL LOW (ref 12.0–16.0)

## 2015-05-23 MED ORDER — HYDROCODONE-HOMATROPINE 5-1.5 MG/5ML PO SYRP: 5.0000 mL | ORAL_SOLUTION | ORAL | Status: DC | PRN

## 2015-05-23 MED ORDER — AMOXICILLIN-POT CLAVULANATE 250-125 MG PO TABS
1.0000 | ORAL_TABLET | Freq: Two times a day (BID) | ORAL | Status: DC
  Filled 2015-05-23: qty 1

## 2015-05-23 MED ORDER — ISOSORB DINITRATE-HYDRALAZINE 20-37.5 MG PO TABS: 1.0000 | ORAL_TABLET | Freq: Three times a day (TID) | ORAL | Status: DC

## 2015-05-23 MED ORDER — FUROSEMIDE 20 MG PO TABS: 20.0000 mg | ORAL_TABLET | Freq: Every day | ORAL | Status: DC

## 2015-05-23 MED ORDER — BUDESONIDE-FORMOTEROL FUMARATE 80-4.5 MCG/ACT IN AERO: 2.0000 | INHALATION_SPRAY | Freq: Two times a day (BID) | RESPIRATORY_TRACT | Status: DC

## 2015-05-23 MED ORDER — ENSURE ENLIVE PO LIQD
237.0000 mL | Freq: Two times a day (BID) | ORAL | Status: DC
Start: 2015-05-23 — End: 2015-05-25

## 2015-05-23 MED ORDER — GUAIFENESIN ER 600 MG PO TB12: 600.0000 mg | ORAL_TABLET | Freq: Two times a day (BID) | ORAL | Status: DC

## 2015-05-23 MED ORDER — TIOTROPIUM BROMIDE MONOHYDRATE 18 MCG IN CAPS: 18.0000 ug | ORAL_CAPSULE | Freq: Every day | RESPIRATORY_TRACT | Status: DC

## 2015-05-23 MED ORDER — ONDANSETRON HCL 4 MG PO TABS: 4.0000 mg | ORAL_TABLET | Freq: Three times a day (TID) | ORAL | Status: DC | PRN

## 2015-05-23 MED ORDER — AMOXICILLIN-POT CLAVULANATE 250-125 MG PO TABS: 1.0000 | ORAL_TABLET | Freq: Two times a day (BID) | ORAL | Status: DC

## 2015-05-23 NOTE — Discharge Summary (Addendum)
Physician Discharge Summary  [name] JXB:[id number] DOB: 10/01/1940 DOA: 05/14/2015  PCP: Taylor Patella, MD  Admit date: 05/14/2015 Discharge date: 05/23/2015  Time spent: 65 minutes  Recommendations for Outpatient Follow-up:  1. Follow-up with Taylor. Dietrich Pates, cardiology in 1-2 weeks. 2. Follow-up with M.D. at skilled nursing facility. Patient will need a basic metabolic profile done next week to follow-up on electrolytes and renal function.On discharge from skilled nursing facility patient will likely need home health RN for medication management. 3. Patient is to follow-up with palliative care at skilled nursing facility.   Discharge Diagnoses:  Principal Problem:   Acute combined systolic and diastolic congestive heart failure (HCC) Active Problems:   Generalized seizure (HCC)   Hypertensive heart and kidney disease   H/O: CVA (cerebrovascular accident)   Hyperkalemia   Protein-calorie malnutrition, severe- wgt 86 lbs   Chest pain   Troponin level elevated   Acute on chronic renal insufficiency (HCC)   Metabolic encephalopathy   Hyperammonemia (HCC)   NICM (nonischemic cardiomyopathy) -EF 25%   No significant CAD at cath May 2012   Anemia   Diastolic dysfunction-grade 3   CAP (community acquired pneumonia)   Pneumonia   Cough   Encounter for palliative care   Goals of care, counseling/discussion   Discharge Condition: Stable and improved  Diet recommendation: Heart healthy  Filed Weights   05/20/15 0548 05/21/15 0542 05/22/15 0558  Weight: 38.148 kg (84 lb 1.6 oz) 39.1 kg (86 lb 3.2 oz) 38.465 kg (84 lb 12.8 oz)    History of present illness:  Per [name] HPI: [name] is a 75 y.o. female past medical history of CVA, seizure an MI as per patient with ischemic cardiomyopathy with an EF of 35% by echo on 12/29/2012 that came in for generalized weakness and chest pain that started 2 weeks prior to admission, accompanied this she was also short  of breath with exertion with a dry cough. On the night of admission, she started having nausea so she decided to come to the ED. She related her pain was better to admitting MD, is not pleuritic she still gets short of breath with exertion and continued to have a dry cough, she had not been taking her Lasix. He denied any fever, nausea, vomiting, diarrhea. She denied any sick contacts ongoing tobacco smoking.  In the ED: Cardiac biomarkers were done which were positive she was mildly hyperkalemic with new acute renal failure and a mild elevation in her alkaline phosphatase and AST ALT.   Hospital Course:  #1 acute on chronic combined systolic and diastolic heart failure Questionable etiology. May be secondary to medication noncompliance versus end-stage CHF. Patient was admitted with worsening shortness of breath place on IV Lasix. Patient had to be placed on milrinone drip per cardiology for improvement with her CHF as patient had a low output state. Cardiology followed patient throughout the hospitalization. Cardiac enzymes were minimally elevated at 0.08 which seemed to have plateaued and felt to be elevated secondary to CHF exacerbation. Patient's weight is 84 pounds. 2-D echo with a EF of 25-30% with grade 3 diastolic dysfunction with wide-open tricuspid regurgitation. Patient had received 2 doses of IV Lasix and subsequently transitioned to oral Lasix at 20 mg daily. Patient was also maintained on aspirin, Coreg, BiDil. Per cardiology overall cardiovascular prognosis is very poor and cardiology recommending palliative care involvement. Palliative care consulted and discussed with patient's son and recommended palliative care follow patient at the skilled nursing facility. Patient  improved clinically and was back to baseline by day of discharge. Patient will follow-up with cardiology as outpatient.   #2 pneumonia/HCAP  Per chest x-ray. Patient during the hospitalization was noted to spike a fever  and had a productive cough. Chest x-ray obtained was consistent with a pneumonia. Patient was placed empirically on IV vancomycin and IV cefepime as well as Mucinex, nebulizer treatments, Pulmicort and Brovana. Patient improved clinically on a daily basis and IV antibiotics were narrowed down to oral Augmentin. Patient will be discharged on 3 more days of oral Augmentin to complete a course of antibiotic treatment. Patient improved clinically and be discharged in stable and improved condition.   #3 acute on chronic kidney disease stage III Likely secondary to problem #1. Baseline creatinine 1.2-1.6. Patient was initially placed on IV diuretics and also had to be placed on IV milrinone with good urine output and improvement with his CHF exacerbation. Renal function trended down. Milrinone was subsequently discontinued on patient transitioned to oral Lasix at 20 mg daily. Patient had a slight bump in the creatinine up to 2.02 where it stabilized. It was felt increasing creatinine was likely secondary to patient's low cardiac output and was recommended that patient will be discharged on Lasix 20 mg daily.   #4 elevated troponin Likely secondary to problem #1. Troponins plateaued. 2-D echo which was done showed a EF of 25-30% with wide-open tricuspid regurgitation as well as a flattening of the ventricular septum in diastole. Patient was seen in consultation by cardiology and patient was treated medically.   #5 tachycardia Felt to be secondary to reflex tachycardia from beta blocker which was held on admission. Patient's Coreg was resumed and patient was followed by cardiology throughout the hospitalization. Patient be discharged in stable condition and will follow-up with cardiology as outpatient.   #6 acute metabolic encephalopathy Multifactorial secondary to problems #1,2,3 in the setting of probable underlying dementia. TSH at 1.765. B-12 levels at 516. Ammonia levels have trended down from 40 to 24 on  lactulose. Urinalysis negative for pyuria. Patient was diuresed during the hospitalization with clinical improvement. Patient was also placed empirically on IV antibiotics that she was noted to have a pneumonia. Patient was transitioned to oral antibiotics. Patient was back to baseline by day of discharge and will be discharged in stable and improved condition.   #7 hyperammonemia Improved on lactulose. Discontinued lactulose.  #8 severe protein calorie malnutrition Continued on nutritional supplementation.  #9 hyperkalemia Improved with diuresis.  #10 seizure disorder Stable. Continued on home regimen of Vimpat and Keppra.  #11 history of stroke Continued on aspirin for secondary stroke prevention.   Procedures:  Chest x-ray 05/12/2015, 05/17/2015, 05/19/2015  Renal ultrasound 05/16/2015  2-D echo 05/15/2015  Consultations:  Cardiology: Taylor. Ladona Ridgel 05/16/2015  palliative care Taylor Linna Darner 05/22/2015    Discharge Exam: Filed Vitals:   05/23/15 1012 05/23/15 1402  BP: 124/85 120/75  Pulse: 107 97  Temp:  97.5 F (36.4 C)  Resp:  16    General: NAD Cardiovascular: Tachycardia Respiratory: CTAB  Discharge Instructions   Discharge Instructions    Diet - low sodium heart healthy    Complete by:  As directed      Discharge instructions    Complete by:  As directed   Follow up with Taylor Tenny Craw in 1-2 weeks. Follow up with palliative care at SNF.     Increase activity slowly    Complete by:  As directed  Current Discharge Medication List    START taking these medications   Details  amoxicillin-clavulanate (AUGMENTIN) 250-125 MG tablet Take 1 tablet by mouth 2 (two) times daily. Take for 3 days then stop. Qty: 6 tablet, Refills: 0    budesonide-formoterol (SYMBICORT) 80-4.5 MCG/ACT inhaler Inhale 2 puffs into the lungs 2 (two) times daily. Qty: 1 Inhaler, Refills: 3    feeding supplement, ENSURE ENLIVE, (ENSURE ENLIVE) LIQD Take 237 mLs by mouth 2 (two)  times daily between meals. Qty: 237 mL, Refills: 12    guaiFENesin (MUCINEX) 600 MG 12 hr tablet Take 1 tablet (600 mg total) by mouth 2 (two) times daily. Take for 3 days then stop. Qty: 6 tablet, Refills: 0    HYDROcodone-homatropine (HYCODAN) 5-1.5 MG/5ML syrup Take 5 mLs by mouth every 4 (four) hours as needed for cough. Qty: 120 mL, Refills: 0    isosorbide-hydrALAZINE (BIDIL) 20-37.5 MG tablet Take 1 tablet by mouth 3 (three) times daily. Qty: 90 tablet, Refills: 3    ondansetron (ZOFRAN) 4 MG tablet Take 1 tablet (4 mg total) by mouth every 8 (eight) hours as needed for nausea or vomiting. Qty: 12 tablet, Refills: 0    tiotropium (SPIRIVA HANDIHALER) 18 MCG inhalation capsule Place 1 capsule (18 mcg total) into inhaler and inhale daily. Qty: 30 capsule, Refills: 3      CONTINUE these medications which have CHANGED   Details  furosemide (LASIX) 20 MG tablet Take 1 tablet (20 mg total) by mouth daily. Qty: 30 tablet, Refills: 1      CONTINUE these medications which have NOT CHANGED   Details  allopurinol (ZYLOPRIM) 100 MG tablet Take 200 mg by mouth daily.  Refills: 5    aspirin 325 MG tablet Take 325 mg by mouth every morning.     calcitRIOL (ROCALTROL) 0.25 MCG capsule Take 0.25 mcg by mouth daily.    carvedilol (COREG) 6.25 MG tablet Take 1 tablet (6.25 mg total) by mouth 2 (two) times daily with a meal. Qty: 60 tablet, Refills: 3    colchicine (COLCRYS) 0.6 MG tablet Take 0.6 mg by mouth daily.    cyclobenzaprine (FLEXERIL) 5 MG tablet Take 5 mg by mouth at bedtime.    levETIRAcetam (KEPPRA) 500 MG tablet Take 1 tablet (500 mg total) by mouth at bedtime. Qty: 90 tablet, Refills: 1    VIMPAT 100 MG TABS Take 1.5 tablets (150 mg total) by mouth 2 (two) times daily. Qty: 90 tablet, Refills: 3       No Known Allergies Follow-up Information    Follow up with Dietrich Pates, MD In 1 week.   Specialty:  Cardiology   Why:  f/u in 1-2 weeks   Contact information:    [address] [place] [address] [phone]       Please follow up.   Why:  f/u with MD at SNF      Please follow up.   Why:  f/u with Palliative care at SNF       The results of significant diagnostics from this hospitalization (including imaging, microbiology, ancillary and laboratory) are listed below for reference.    Significant Diagnostic Studies: Dg Chest 2 View  05/12/2015  CLINICAL DATA:  Low back pain.  Posterior chest pain. EXAM: CHEST  2 VIEW COMPARISON:  03/08/2014 FINDINGS: Cardiac enlargement is similar to the prior study. Negative for heart failure. Small bilateral pleural effusions. Mild streaky density in both lung bases consistent with atelectasis or infiltrate. No  mass lesion. Nipple shadow on the left noted Severe compression fracture mid thoracic spine of indeterminate age. Most recent lateral view was 02/25/2013 and the fracture was not present at that time IMPRESSION: Cardiac enlargement with small pleural effusions. Negative for heart failure Mild bibasilar atelectasis/infiltrate. Severe mid thoracic compression fracture indeterminate age. Electronically Signed   By: Marlan Palau M.D.   On: 05/12/2015 15:41   Dg Lumbar Spine 2-3 Views  05/12/2015  CLINICAL DATA:  Low back pain EXAM: LUMBAR SPINE - 2-3 VIEW COMPARISON:  02/19/2013 FINDINGS: Moderate compression fracture L2 is chronic and unchanged. Mild to moderate levoscoliosis unchanged. No new fracture. L5 is partially sacralized. Generalized osteopenia. No significant disc degeneration. 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 No acute abnormality. Electronically Signed   By: Marlan Palau M.D.   On: 05/12/2015 15:32   US Renal  05/16/2015  CLINICAL DATA:  75 year old female with acute on chronic renal failure EXAM: RENAL / URINARY TRACT ULTRASOUND COMPLETE COMPARISON:  Prior renal ultrasound 03/06/2014 FINDINGS: Right Kidney: Length: 8.4 cm. No evidence of  hydronephrosis. Significant echogenicity of the renal parenchyma with increased conspicuity of the corticomedullary interface. There is mild perinephric fluid consistent with perinephric sweat. Left Kidney: Length: 9.1 cm. No evidence of hydronephrosis. Significant echogenicity of the renal parenchyma with increased conspicuity of the corticomedullary interface. There is mild perinephric fluid consistent with perinephric sweat. Bladder: Appears normal for degree of bladder distention. IMPRESSION: 1. Significantly echogenic kidneys bilaterally consistent with medical renal disease. 2. Mild perinephric fluid (perinephric 'sweat' sign) bilaterally which can be seen in the setting of acute renal failure. 3. No evidence of hydronephrosis. Electronically Signed   By: Malachy Moan M.D.   On: 05/16/2015 15:42   Dg Chest Port 1 View  05/19/2015  CLINICAL DATA:  Right-sided chest pain, shortness of breath, dry cough for the past 3 weeks. History of CHF. EXAM: PORTABLE CHEST 1 VIEW COMPARISON:  05/12/2015; 03/08/2014; 03/05/2014; 02/25/2013 FINDINGS: Grossly unchanged enlarged cardiac silhouette and mediastinal contours. Interval placement of right upper extremity approach PICC line with tip projected over the superior cavoatrial junction. Interval development of left basilar potential airspace opacities with development of ill-defined heterogeneous slightly nodular interstitial opacities within the peripheral aspect of the left lung. No pneumothorax. Unchanged bones. IMPRESSION: 1. Worsening ill-defined nodular interstitial opacities within the left lung with relative area of consolidation within the left lower lung - findings worrisome for multifocal infection, including potential atypical etiologies. 2. Right upper extremity approach PICC line tip terminates over the superior cavoatrial junction. Electronically Signed   By: Simonne Come M.D.   On: 05/19/2015 18:47   Dg Abd 2 Views  05/17/2015  CLINICAL DATA:  Left  lower quadrant pain for 3 days EXAM: ABDOMEN - 2 VIEW COMPARISON:  05/12/2015 FINDINGS: The stomach is distended with air and ingested material. Scattered large and small bowel gas is noted without obstructive change. No free air is seen. No abnormal mass or abnormal calcifications are noted. There changes of prior L2 fracture stable in appearance. IMPRESSION: Somewhat distended stomach. No obstructive changes are seen.  No free air is noted. Electronically Signed   By: Alcide Clever M.D.   On: 05/17/2015 17:06    Microbiology: Recent Results (from the past 240 hour(s))  Urine culture     Status: None   Collection Time: 05/14/15  3:26 AM  Result Value Ref Range Status   Specimen Description URINE, CLEAN CATCH  Final   Special Requests NONE  Final  Culture MULTIPLE SPECIES PRESENT, SUGGEST RECOLLECTION  Final   Report Status 05/15/2015 FINAL  Final  Urine culture     Status: None   Collection Time: 05/16/15  4:05 PM  Result Value Ref Range Status   Specimen Description URINE, CLEAN CATCH  Final   Special Requests NONE  Final   Culture MULTIPLE SPECIES PRESENT, SUGGEST RECOLLECTION  Final   Report Status 05/18/2015 FINAL  Final  MRSA PCR Screening     Status: None   Collection Time: 05/19/15  8:23 PM  Result Value Ref Range Status   MRSA by PCR NEGATIVE NEGATIVE Final    Comment:        The GeneXpert MRSA Assay (FDA approved for NASAL specimens only), is one component of a comprehensive MRSA colonization surveillance program. It is not intended to diagnose MRSA infection nor to guide or monitor treatment for MRSA infections.   Culture, blood (Routine X 2) w Reflex to ID Panel     Status: None (Preliminary result)   Collection Time: 05/19/15  9:17 PM  Result Value Ref Range Status   Specimen Description BLOOD LEFT HAND  Final   Special Requests IN PEDIATRIC BOTTLE 1 CC  Final   Culture   Final    NO GROWTH 4 DAYS Performed at [facility]    Report Status PENDING   Incomplete  Culture, blood (Routine X 2) w Reflex to ID Panel     Status: None (Preliminary result)   Collection Time: 05/19/15  9:22 PM  Result Value Ref Range Status   Specimen Description BLOOD LEFT ANTECUBITAL  Final   Special Requests IN PEDIATRIC BOTTLE 1 ML  Final   Culture   Final    NO GROWTH 4 DAYS Performed at [facility]    Report Status PENDING  Incomplete     Labs: Basic Metabolic Panel:  Recent Labs Lab 05/19/15 0518 05/20/15 0450 05/21/15 0500 05/22/15 0533 05/23/15 0500  NA 140 138 142 138 135  K 4.9 4.8 4.7 5.0 4.6  CL 111 108 107 110 107  CO2 19* 19* 22 21* 19*  GLUCOSE 107* 119* 114* 105* 98  BUN 35* 28* 28* 42* 40*  CREATININE 1.58* 1.53* 1.64* 2.02* 2.02*  CALCIUM 9.4 9.4 9.3 9.2 8.9  MG  --   --   --  2.3  --    Liver Function Tests: No results for input(s): AST, ALT, ALKPHOS, BILITOT, PROT, ALBUMIN in the last 168 hours. No results for input(s): LIPASE, AMYLASE in the last 168 hours.  Recent Labs Lab 05/19/15 0720  AMMONIA 24   CBC:  Recent Labs Lab 05/20/15 0450 05/21/15 0500 05/22/15 0533 05/23/15 0500  WBC 10.1 8.6 7.9 7.8  HGB 8.8* 8.8* 8.6* 8.3*  HCT 25.5* 26.3* 25.0* 24.4*  MCV 97.0 95.3 95.4 96.4  PLT 315 330 331 343   Cardiac Enzymes: No results for input(s): CKTOTAL, CKMB, CKMBINDEX, TROPONINI in the last 168 hours. BNP: BNP (last 3 results)  Recent Labs  05/14/15 0419  BNP >4500.0*    ProBNP (last 3 results) No results for input(s): PROBNP in the last 8760 hours.  CBG: No results for input(s): GLUCAP in the last 168 hours.     SignedRamiro Harvest MD.  [facility] 05/23/2015, 2:05 PM

## 2015-05-23 NOTE — Clinical Social Work Placement (Signed)
   CLINICAL SOCIAL WORK PLACEMENT  NOTE 05/23/15 - DISCHARGED TO [facility]  Date:  05/23/2015  Patient Details  Name: [name] MRN: [id number] Date of Birth: [birth year]  Clinical Social Work is seeking post-discharge placement for this patient at the Skilled  Nursing Facility level of care (*CSW will initial, date and re-position this form in  chart as items are completed):  Yes   Patient/family provided with [facility] Social Work Department's list of facilities offering this level of care within the geographic area requested by the patient (or if unable, by the patient's family).  Yes   Patient/family informed of their freedom to choose among providers that offer the needed level of care, that participate in Medicare, Medicaid or managed care program needed by the patient, have an available bed and are willing to accept the patient.  Yes   Patient/family informed of [facility] ownership interest in [facility] and [facility], as well as of the fact that they are under no obligation to receive care at these facilities.  PASRR submitted to EDS on 05/20/15     PASRR number received on 05/20/15     Existing PASRR number confirmed on       FL2 transmitted to all facilities in geographic area requested by pt/family on 05/20/15     FL2 transmitted to all facilities within larger geographic area on       Patient informed that his/her managed care company has contracts with or will negotiate with certain facilities, including the following:        Yes   Patient/family informed of bed offers received.  Patient chooses bed at [facility]     Physician recommends and patient chooses bed at      Patient to be transferred to [facility] on  .  Patient to be transferred to facility by  ambulance     Patient family notified on  05/23/15 of transfer.  Name of family member notified:   Daughter at the bedside and son Zaire Otano by phone (called by daughter)  while CSW in room.     PHYSICIAN Please sign FL2     Additional Comment:    _______________________________________________ Cristobal Goldmann, LCSW 05/23/2015, 3:13 PM

## 2015-05-23 NOTE — Progress Notes (Signed)
  Discussed case with Dr. Purvis Sheffield. Cardiology asked if ok for discharge. Dr. Purvis Sheffield has reviewed chart and has cleared patient from a cardiac standpoint for discharge. Will notify, Dr. Janee Morn, primary MD.   Sharol Harness, [place] 05/23/2015

## 2015-05-24 LAB — CULTURE, BLOOD (ROUTINE X 2)
Culture: NO GROWTH
Culture: NO GROWTH

## 2015-05-25 ENCOUNTER — Encounter: Payer: Self-pay | Admitting: Adult Health

## 2015-05-25 ENCOUNTER — Non-Acute Institutional Stay (SKILLED_NURSING_FACILITY): Payer: Commercial Managed Care - HMO | Admitting: Adult Health

## 2015-05-25 DIAGNOSIS — Z8673 Personal history of transient ischemic attack (TIA), and cerebral infarction without residual deficits: Secondary | ICD-10-CM

## 2015-05-25 DIAGNOSIS — N289 Disorder of kidney and ureter, unspecified: Secondary | ICD-10-CM

## 2015-05-25 DIAGNOSIS — I130 Hypertensive heart and chronic kidney disease with heart failure and stage 1 through stage 4 chronic kidney disease, or unspecified chronic kidney disease: Secondary | ICD-10-CM | POA: Diagnosis not present

## 2015-05-25 DIAGNOSIS — J189 Pneumonia, unspecified organism: Secondary | ICD-10-CM | POA: Diagnosis not present

## 2015-05-25 DIAGNOSIS — M109 Gout, unspecified: Secondary | ICD-10-CM | POA: Diagnosis not present

## 2015-05-25 DIAGNOSIS — N189 Chronic kidney disease, unspecified: Secondary | ICD-10-CM | POA: Diagnosis not present

## 2015-05-25 DIAGNOSIS — I5041 Acute combined systolic (congestive) and diastolic (congestive) heart failure: Secondary | ICD-10-CM | POA: Diagnosis not present

## 2015-05-25 DIAGNOSIS — I509 Heart failure, unspecified: Secondary | ICD-10-CM | POA: Diagnosis not present

## 2015-05-25 DIAGNOSIS — R5381 Other malaise: Secondary | ICD-10-CM | POA: Diagnosis not present

## 2015-05-25 DIAGNOSIS — R569 Unspecified convulsions: Secondary | ICD-10-CM | POA: Diagnosis not present

## 2015-05-25 NOTE — Progress Notes (Signed)
Patient ID: [name], female   DOB: Aug 12, 1940, 75 y.o.   MRN: [id number]    DATE:    05/25/15  MRN:  [id number]  BIRTHDAY: 07-29-1940  Facility:  Nursing Home Location:  [facility]  Nursing Home Room Number: 1204-P  LEVEL OF CARE:  SNF (31)  Contact Information    Name Relation Home Work Mobile   [place] Son   [phone]   Whiteman,Latoya Daughter  [phone]       Code Status History    Date Active Date Inactive Code Status Order ID Comments User Context   05/14/2015  9:05 AM 05/23/2015  9:52 PM Full Code [id number]  Marinda Elk, MD Inpatient   03/05/2014  9:32 PM 03/09/2014  4:18 PM Full Code [id number]  Lynden Oxford, MD ED   02/24/2013  9:37 AM 02/28/2013  5:17 PM Full Code [id number]  Cleora Fleet, MD Inpatient   02/12/2013 12:34 AM 02/14/2013  9:32 PM Full Code [id number]  Therisa Doyne, MD Inpatient   01/04/2013  4:47 PM 01/15/2013  4:48 PM Full Code [id number]  Jacquelynn Cree, PA-C Inpatient   12/28/2012  1:41 PM 01/04/2013  4:47 PM Full Code [id number]  Alba Cory, MD Inpatient       Chief Complaint  Patient presents with  . Hospitalization Follow-up    HISTORY OF PRESENT ILLNESS:  This is a 75 year old female who has been admitted to [facility] on 05/23/15 from [facility]. She has PMH of CVA, seizure, MI, ischemic cardiomyopathy with an EF of 35%. She was hospitalized due to acute on chronic combined systolic and diastolic heart failure. Probable etiology could be due to noncompliance (not taking her Lasix) versus end-stage CHF. She was given IV lasix and Milrinone drip per cardiology consult. 2-D echo with EF of 25-30% with grade 3 diastolic dysfunction with wide-open tricuspid regurgitation. Per cardiology overall cardiovascular prognosis is very poor and palliative consult was done while in the hospital. Chest x-ray showed pneumonia and was empirically treated with IV Vancomycin, IV Cefepime, Mucinex,  nebulizer treatments, Pulmicort and Brovana.  She has been admitted for a short-term rehabilitation.  PAST MEDICAL HISTORY:  Past Medical History  Diagnosis Date  . MI (myocardial infarction) (HCC)   . Hypertension   . Stroke (HCC)   . Congestive heart failure (CHF) (HCC)     EF 35% Nov 2014  . Epilepsy (HCC)   . Smoker      CURRENT MEDICATIONS: Reviewed  Patient's Medications  New Prescriptions   No medications on file  Previous Medications   ALLOPURINOL (ZYLOPRIM) 100 MG TABLET    Take 200 mg by mouth daily.    ASPIRIN 325 MG TABLET    Take 325 mg by mouth every morning.    BUDESONIDE-FORMOTEROL (SYMBICORT) 80-4.5 MCG/ACT INHALER    Inhale 2 puffs into the lungs 2 (two) times daily.   CALCITRIOL (ROCALTROL) 0.25 MCG CAPSULE    Take 0.25 mcg by mouth daily.   CARVEDILOL (COREG) 6.25 MG TABLET    Take 1 tablet (6.25 mg total) by mouth 2 (two) times daily with a meal.   COLCHICINE (COLCRYS) 0.6 MG TABLET    Take 0.6 mg by mouth daily.   CYCLOBENZAPRINE (FLEXERIL) 5 MG TABLET    Take 5 mg by mouth at bedtime.   FUROSEMIDE (LASIX) 20 MG TABLET    Take 1 tablet (20 mg total) by mouth daily.   HYDROCODONE-HOMATROPINE (HYCODAN) 5-1.5 MG/5ML SYRUP  Take 5 mLs by mouth every 4 (four) hours as needed for cough.   ISOSORBIDE-HYDRALAZINE (BIDIL) 20-37.5 MG TABLET    Take 1 tablet by mouth 3 (three) times daily.   LEVETIRACETAM (KEPPRA) 500 MG TABLET    Take 1 tablet (500 mg total) by mouth at bedtime.   NUTRITIONAL SUPPLEMENTS (NUTRITIONAL SUPPLEMENT PO)    Take 90 mLs by mouth 2 (two) times daily between meals.   ONDANSETRON (ZOFRAN) 4 MG TABLET    Take 1 tablet (4 mg total) by mouth every 8 (eight) hours as needed for nausea or vomiting.   TIOTROPIUM (SPIRIVA HANDIHALER) 18 MCG INHALATION CAPSULE    Place 1 capsule (18 mcg total) into inhaler and inhale daily.   VIMPAT 100 MG TABS    Take 1.5 tablets (150 mg total) by mouth 2 (two) times daily.  Modified Medications   No medications on  file     AMOXICILLIN-CLAVULANATE (AUGMENTIN) 250-125 MG TABLET    Take 1 tablet by mouth 2 (two) times daily. Take for 3 days then stop.       GUAIFENESIN (MUCINEX) 600 MG 12 HR TABLET    Take 1 tablet (600 mg total) by mouth 2 (two) times daily. Take for 3 days then stop.    No Known Allergies   REVIEW OF SYSTEMS:  GENERAL: no change in appetite, no fatigue, no weight changes, no fever, chills or weakness EYES: Denies change in vision, dry eyes, eye pain, itching or discharge EARS: Denies change in hearing, ringing in ears, or earache NOSE: Denies nasal congestion or epistaxis MOUTH and THROAT: Denies oral discomfort, gingival pain or bleeding, pain from teeth or hoarseness   RESPIRATORY: no cough, SOB, DOE, wheezing, hemoptysis CARDIAC: no chest pain, edema or palpitations GI: no abdominal pain, diarrhea, constipation, heart burn, nausea or vomiting GU: Denies dysuria, frequency, hematuria, incontinence, or discharge PSYCHIATRIC: Denies feeling of depression or anxiety. No report of hallucinations, insomnia, paranoia, or agitation   PHYSICAL EXAMINATION  GENERAL APPEARANCE: Well nourished. In no acute distress. Normal body habitus SKIN:  Skin is warm and dry.  HEAD: Normal in size and contour. No evidence of trauma EYES: Lids open and close normally. No blepharitis, entropion or ectropion. PERRL. Conjunctivae are clear and sclerae are white. Lenses are without opacity EARS: Pinnae are normal. Patient hears normal voice tunes of the examiner MOUTH and THROAT: Lips are without lesions. Oral mucosa is moist and without lesions. Tongue is normal in shape, size, and color and without lesions NECK: supple, trachea midline, no neck masses, no thyroid tenderness, no thyromegaly LYMPHATICS: no LAN in the neck, no supraclavicular LAN RESPIRATORY: breathing is even & unlabored, BS CTAB CARDIAC: RRR, no murmur,no extra heart sounds, no edema GI: abdomen soft, normal BS, no masses, no  tenderness, no hepatomegaly, no splenomegaly EXTREMITIES:  Able to move X 4 extremities PSYCHIATRIC: Alert and oriented X 3. Affect and behavior are appropriate  LABS/RADIOLOGY: Labs reviewed: Basic Metabolic Panel:  Recent Labs  16/10/96 1151  05/21/15 0500 05/22/15 0533 05/23/15 05/23/15 0500  NA  --   < > 142 138 135* 135  K  --   < > 4.7 5.0  --  4.6  CL  --   < > 107 110  --  107  CO2  --   < > 22 21*  --  19*  GLUCOSE  --   < > 114* 105*  --  98  BUN  --   < > 28* 42*  40* 40*  CREATININE  --   < > 1.64* 2.02* 2.0* 2.02*  CALCIUM  --   < > 9.3 9.2  --  8.9  MG 2.2  --   --  2.3  --   --   < > = values in this interval not displayed. Liver Function Tests:  Recent Labs  05/14/15 0356  AST 85*  ALT 74*  ALKPHOS 158*  BILITOT 1.0  PROT 7.9  ALBUMIN 4.3     Recent Labs  05/16/15 1125 05/19/15 0720  AMMONIA 40* 24   CBC:  Recent Labs  05/14/15 0356  05/21/15 0500 05/22/15 0533 05/23/15 05/23/15 0500  WBC 9.6  < > 8.6 7.9 7.8 7.8  NEUTROABS 7.4  --   --   --   --   --   HGB 10.4*  < > 8.8* 8.6*  --  8.3*  HCT 30.7*  < > 26.3* 25.0*  --  24.4*  MCV 95.0  < > 95.3 95.4  --  96.4  PLT 350  < > 330 331  --  343  < > = values in this interval not displayed.  Cardiac Enzymes:  Recent Labs  05/14/15 1151 05/14/15 1745 05/14/15 2337  TROPONINI 0.08* 0.08* 0.08*   CBG:  Recent Labs  05/14/15 0210  GLUCAP 116*     Dg Chest 2 View  05/12/2015  CLINICAL DATA:  Low back pain.  Posterior chest pain. EXAM: CHEST  2 VIEW COMPARISON:  03/08/2014 FINDINGS: Cardiac enlargement is similar to the prior study. Negative for heart failure. Small bilateral pleural effusions. Mild streaky density in both lung bases consistent with atelectasis or infiltrate. No mass lesion. Nipple shadow on the left noted Severe compression fracture mid thoracic spine of indeterminate age. Most recent lateral view was 02/25/2013 and the fracture was not present at that time  IMPRESSION: Cardiac enlargement with small pleural effusions. Negative for heart failure Mild bibasilar atelectasis/infiltrate. Severe mid thoracic compression fracture indeterminate age. Electronically Signed   By: Marlan Palau M.D.   On: 05/12/2015 15:41   Dg Lumbar Spine 2-3 Views  05/12/2015  CLINICAL DATA:  Low back pain EXAM: LUMBAR SPINE - 2-3 VIEW COMPARISON:  02/19/2013 FINDINGS: Moderate compression fracture L2 is chronic and unchanged. Mild to moderate levoscoliosis unchanged. No new fracture. L5 is partially sacralized. Generalized osteopenia. No significant disc degeneration. 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 No acute abnormality. Electronically Signed   By: Marlan Palau M.D.   On: 05/12/2015 15:32   US Renal  05/16/2015  CLINICAL DATA:  75 year old female with acute on chronic renal failure EXAM: RENAL / URINARY TRACT ULTRASOUND COMPLETE COMPARISON:  Prior renal ultrasound 03/06/2014 FINDINGS: Right Kidney: Length: 8.4 cm. No evidence of hydronephrosis. Significant echogenicity of the renal parenchyma with increased conspicuity of the corticomedullary interface. There is mild perinephric fluid consistent with perinephric sweat. Left Kidney: Length: 9.1 cm. No evidence of hydronephrosis. Significant echogenicity of the renal parenchyma with increased conspicuity of the corticomedullary interface. There is mild perinephric fluid consistent with perinephric sweat. Bladder: Appears normal for degree of bladder distention. IMPRESSION: 1. Significantly echogenic kidneys bilaterally consistent with medical renal disease. 2. Mild perinephric fluid (perinephric 'sweat' sign) bilaterally which can be seen in the setting of acute renal failure. 3. No evidence of hydronephrosis. Electronically Signed   By: Malachy Moan M.D.   On: 05/16/2015 15:42   Dg Chest Port 1 View  05/19/2015  CLINICAL DATA:  Right-sided chest pain,  shortness of breath, dry cough for the past 3  weeks. History of CHF. EXAM: PORTABLE CHEST 1 VIEW COMPARISON:  05/12/2015; 03/08/2014; 03/05/2014; 02/25/2013 FINDINGS: Grossly unchanged enlarged cardiac silhouette and mediastinal contours. Interval placement of right upper extremity approach PICC line with tip projected over the superior cavoatrial junction. Interval development of left basilar potential airspace opacities with development of ill-defined heterogeneous slightly nodular interstitial opacities within the peripheral aspect of the left lung. No pneumothorax. Unchanged bones. IMPRESSION: 1. Worsening ill-defined nodular interstitial opacities within the left lung with relative area of consolidation within the left lower lung - findings worrisome for multifocal infection, including potential atypical etiologies. 2. Right upper extremity approach PICC line tip terminates over the superior cavoatrial junction. Electronically Signed   By: Simonne Come M.D.   On: 05/19/2015 18:47   Dg Abd 2 Views  05/17/2015  CLINICAL DATA:  Left lower quadrant pain for 3 days EXAM: ABDOMEN - 2 VIEW COMPARISON:  05/12/2015 FINDINGS: The stomach is distended with air and ingested material. Scattered large and small bowel gas is noted without obstructive change. No free air is seen. No abnormal mass or abnormal calcifications are noted. There changes of prior L2 fracture stable in appearance. IMPRESSION: Somewhat distended stomach. No obstructive changes are seen.  No free air is noted. Electronically Signed   By: Alcide Clever M.D.   On: 05/17/2015 17:06    ASSESSMENT/PLAN:  Physical deconditioning - for rehabilitation  Acute combined systolic and diastolic congestive heart failure - continue Lasix 20 mg 1 tab PO daily, Coreg 6.25 mg 1 tab by mouth twice a day, Bidil 20-37.5 mg 1 tab PO daily  Pneumonia - continue Augmentin 250-125 mg 1 tab by mouth 3 times a day (end date 05/26/15),  Spiriva 18 mcg inhale 1 capsule daily, Symbicort 80-4.5 g inhaler inhale 2 puffs  twice a day, Mucinex ER 600 mg 1 tab by mouth twice a day 3 days (stop date 05/26/15); check CBC  Acute on chronic kidney disease, stage 3 - continue Calcitriol 0.25 g capsule daily; creatinine 2.02, check BMP  Seizure disorder - Vimpat 100 mg take 1 1/2 tab= 150 mg twice a day, Keppra 500 mg 1 tab by mouth daily at bedtime  History of stroke - continue aspirin EC 325 mg 1 tab by mouth daily a.m., cyclobenzaprine 5 mg 1 tab by mouth daily at bedtime for muscle spasm  Gout -  Continue Allopurinol 100 mg daily, Colcrys 0.6 mg 1 tab by mouth daily  Hypertensive heart disease - continue Coreg 6.25 mg 1 tab by mouth twice a day, Bidil 20-30 7.5 mg 1 tab by mouth 3 times a day     Goals of care:  Short-term rehabilitation    [facility], NP [facility] [phone]

## 2015-05-26 ENCOUNTER — Encounter: Payer: Self-pay | Admitting: Internal Medicine

## 2015-05-26 ENCOUNTER — Non-Acute Institutional Stay: Payer: Commercial Managed Care - HMO | Admitting: Internal Medicine

## 2015-05-26 DIAGNOSIS — N183 Chronic kidney disease, stage 3 unspecified: Secondary | ICD-10-CM

## 2015-05-26 DIAGNOSIS — Z8673 Personal history of transient ischemic attack (TIA), and cerebral infarction without residual deficits: Secondary | ICD-10-CM

## 2015-05-26 DIAGNOSIS — R1310 Dysphagia, unspecified: Secondary | ICD-10-CM

## 2015-05-26 DIAGNOSIS — I25119 Atherosclerotic heart disease of native coronary artery with unspecified angina pectoris: Secondary | ICD-10-CM

## 2015-05-26 DIAGNOSIS — R5381 Other malaise: Secondary | ICD-10-CM | POA: Diagnosis not present

## 2015-05-26 DIAGNOSIS — J189 Pneumonia, unspecified organism: Secondary | ICD-10-CM

## 2015-05-26 DIAGNOSIS — I130 Hypertensive heart and chronic kidney disease with heart failure and stage 1 through stage 4 chronic kidney disease, or unspecified chronic kidney disease: Secondary | ICD-10-CM

## 2015-05-26 DIAGNOSIS — R4189 Other symptoms and signs involving cognitive functions and awareness: Secondary | ICD-10-CM

## 2015-05-26 DIAGNOSIS — I509 Heart failure, unspecified: Secondary | ICD-10-CM

## 2015-05-26 DIAGNOSIS — I5042 Chronic combined systolic (congestive) and diastolic (congestive) heart failure: Secondary | ICD-10-CM

## 2015-05-26 DIAGNOSIS — R569 Unspecified convulsions: Secondary | ICD-10-CM

## 2015-05-26 DIAGNOSIS — D638 Anemia in other chronic diseases classified elsewhere: Secondary | ICD-10-CM

## 2015-05-26 DIAGNOSIS — E46 Unspecified protein-calorie malnutrition: Secondary | ICD-10-CM

## 2015-05-26 DIAGNOSIS — M1A9XX0 Chronic gout, unspecified, without tophus (tophi): Secondary | ICD-10-CM

## 2015-05-26 NOTE — Progress Notes (Signed)
LOCATION: [facility]  PCP: [name], MD   Code Status: Full Code  Goals of care: Advanced Directive information Advanced Directives 05/14/2015  Does patient have an advance directive? No  Would patient like information on creating an advanced directive? No - patient declined information       Extended Emergency Contact Information Primary Emergency Contact: Scotty Court [place] Phone: [phone] Relation: Son Secondary Emergency Contact: Jolaine Artist [place] of [place] Work Phone: [phone] Mobile Phone: [phone] Relation: Daughter   No Known Allergies  Chief Complaint  Patient presents with  . New Admit To SNF    New Admission     HPI:  Patient is a 75 y.o. female seen today for short term rehabilitation post hospital admission from 05/14/15-05/23/15 with acute combined systolic and diastolic CHF. She required diuresis. She was also treated for HCAP and acute on ckd stage 3. She had acute metabolic encephalopathy with elevated ammonia level and responded well to lactulose. She has PMH of CVA, seizure disorder, CHF, ckd stage 3, NICM among others.   Review of Systems:  Constitutional: Negative for fever, chills, diaphoresis.  HENT: Negative for headache, congestion, nasal discharge. Positive for difficulty swallowing big pills.   Eyes: Negative for blurred vision, double vision and discharge.  Respiratory: Negative for cough, shortness of breath and wheezing.   Cardiovascular: Negative for chest pain, palpitations, leg swelling.  Gastrointestinal: Negative for heartburn, nausea, vomiting, abdominal pain, loss of appetite, melena, diarrhea and constipation. Last bowel movement was last night.  Genitourinary: Negative for dysuria and flank pain.  Musculoskeletal: Negative for back pain, fall in the facility.  Skin: Negative for itching, rash.  Neurological: Negative for dizziness. Psychiatric/Behavioral:  Negative for depression   Past Medical History  Diagnosis Date  . MI (myocardial infarction) (HCC)   . Hypertension   . Stroke (HCC)   . Congestive heart failure (CHF) (HCC)     EF 35% Nov 2014  . Epilepsy (HCC)   . Smoker    Past Surgical History  Procedure Laterality Date  . No past surgeries     Social History:   reports that she has been smoking Cigarettes.  She has been smoking about 0.10 packs per day. She has never used smokeless tobacco. She reports that she drinks alcohol. She reports that she does not use illicit drugs.  Family History  Problem Relation Age of Onset  . Cancer Father   . Anemia Daughter     Medications:   Medication List       This list is accurate as of: 05/26/15  1:58 PM.  Always use your most recent med list.               allopurinol 100 MG tablet  Commonly known as:  ZYLOPRIM  Take 200 mg by mouth daily.     amoxicillin-clavulanate 250-125 MG tablet  Commonly known as:  AUGMENTIN  Take 1 tablet by mouth 2 (two) times daily. Take for 3 days then stop.     aspirin 325 MG tablet  Take 325 mg by mouth every morning.     budesonide-formoterol 80-4.5 MCG/ACT inhaler  Commonly known as:  SYMBICORT  Inhale 2 puffs into the lungs 2 (two) times daily.     calcitRIOL 0.25 MCG capsule  Commonly known as:  ROCALTROL  Take 0.25 mcg by mouth daily.     carvedilol 6.25 MG tablet  Commonly known as:  COREG  Take 1 tablet (6.25  mg total) by mouth 2 (two) times daily with a meal.     COLCRYS 0.6 MG tablet  Generic drug:  colchicine  Take 0.6 mg by mouth daily.     cyclobenzaprine 5 MG tablet  Commonly known as:  FLEXERIL  Take 5 mg by mouth at bedtime.     furosemide 20 MG tablet  Commonly known as:  LASIX  Take 1 tablet (20 mg total) by mouth daily.     guaiFENesin 600 MG 12 hr tablet  Commonly known as:  MUCINEX  Take 1 tablet (600 mg total) by mouth 2 (two) times daily. Take for 3 days then stop.     HYDROcodone-homatropine  5-1.5 MG/5ML syrup  Commonly known as:  HYCODAN  Take 5 mLs by mouth every 4 (four) hours as needed for cough.     isosorbide-hydrALAZINE 20-37.5 MG tablet  Commonly known as:  BIDIL  Take 1 tablet by mouth 3 (three) times daily.     levETIRAcetam 500 MG tablet  Commonly known as:  KEPPRA  Take 1 tablet (500 mg total) by mouth at bedtime.     NUTRITIONAL SUPPLEMENT PO  Take 90 mLs by mouth 2 (two) times daily between meals.     ondansetron 4 MG tablet  Commonly known as:  ZOFRAN  Take 1 tablet (4 mg total) by mouth every 8 (eight) hours as needed for nausea or vomiting.     tiotropium 18 MCG inhalation capsule  Commonly known as:  SPIRIVA HANDIHALER  Place 1 capsule (18 mcg total) into inhaler and inhale daily.     VIMPAT 100 MG Tabs  Generic drug:  Lacosamide  Take 1.5 tablets (150 mg total) by mouth 2 (two) times daily.        Immunizations: There is no immunization history for the selected administration types on file for this patient.   Physical Exam: Filed Vitals:   05/26/15 1351  BP: 102/60  Pulse: 99  Temp: 98 F (36.7 C)  TempSrc: Oral  Resp: 16  Height: 5\' 2"  (1.575 m)  Weight: 84 lb (38.102 kg)  SpO2: 98%   Body mass index is 15.36 kg/(m^2).  General- elderly female, thin built, in no acute distress Head- normocephalic, atraumatic Nose- no maxillary or frontal sinus tenderness, no nasal discharge Throat- moist mucus membrane, has upper dentures Eyes- PERRLA, EOMI, no pallor, no icterus, no discharge, normal conjunctiva, normal sclera Neck- no cervical lymphadenopathy Cardiovascular- normal s1,s2, no leg edema Respiratory- bilateral clear to auscultation, no wheeze, no rhonchi, no crackles, no use of accessory muscles Abdomen- bowel sounds present, soft, non tender Musculoskeletal- able to move all 4 extremities, generalized weakness Neurological- alert and oriented to person, place and time Skin- warm and dry Psychiatry- normal mood and  affect    Labs reviewed: Basic Metabolic Panel:  Recent Labs  16/10/96 1151  05/21/15 0500 05/22/15 0533 05/23/15 05/23/15 0500  NA  --   < > 142 138 135* 135  K  --   < > 4.7 5.0  --  4.6  CL  --   < > 107 110  --  107  CO2  --   < > 22 21*  --  19*  GLUCOSE  --   < > 114* 105*  --  98  BUN  --   < > 28* 42* 40* 40*  CREATININE  --   < > 1.64* 2.02* 2.0* 2.02*  CALCIUM  --   < > 9.3 9.2  --  8.9  MG 2.2  --   --  2.3  --   --   < > = values in this interval not displayed. Liver Function Tests:  Recent Labs  05/14/15 0356  AST 85*  ALT 74*  ALKPHOS 158*  BILITOT 1.0  PROT 7.9  ALBUMIN 4.3   No results for input(s): LIPASE, AMYLASE in the last 8760 hours.  Recent Labs  05/16/15 1125 05/19/15 0720  AMMONIA 40* 24   CBC:  Recent Labs  05/14/15 0356  05/21/15 0500 05/22/15 0533 05/23/15 05/23/15 0500  WBC 9.6  < > 8.6 7.9 7.8 7.8  NEUTROABS 7.4  --   --   --   --   --   HGB 10.4*  < > 8.8* 8.6*  --  8.3*  HCT 30.7*  < > 26.3* 25.0*  --  24.4*  MCV 95.0  < > 95.3 95.4  --  96.4  PLT 350  < > 330 331  --  343  < > = values in this interval not displayed. Cardiac Enzymes:  Recent Labs  05/14/15 1151 05/14/15 1745 05/14/15 2337  TROPONINI 0.08* 0.08* 0.08*   BNP: Invalid input(s): POCBNP CBG:  Recent Labs  05/14/15 0210  GLUCAP 116*    Radiological Exams: Dg Chest 2 View  05/12/2015  CLINICAL DATA:  Low back pain.  Posterior chest pain. EXAM: CHEST  2 VIEW COMPARISON:  03/08/2014 FINDINGS: Cardiac enlargement is similar to the prior study. Negative for heart failure. Small bilateral pleural effusions. Mild streaky density in both lung bases consistent with atelectasis or infiltrate. No mass lesion. Nipple shadow on the left noted Severe compression fracture mid thoracic spine of indeterminate age. Most recent lateral view was 02/25/2013 and the fracture was not present at that time IMPRESSION: Cardiac enlargement with small pleural effusions.  Negative for heart failure Mild bibasilar atelectasis/infiltrate. Severe mid thoracic compression fracture indeterminate age. Electronically Signed   By: Marlan Palau M.D.   On: 05/12/2015 15:41   Dg Lumbar Spine 2-3 Views  05/12/2015  CLINICAL DATA:  Low back pain EXAM: LUMBAR SPINE - 2-3 VIEW COMPARISON:  02/19/2013 FINDINGS: Moderate compression fracture L2 is chronic and unchanged. Mild to moderate levoscoliosis unchanged. No new fracture. L5 is partially sacralized. Generalized osteopenia. No significant disc degeneration. 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 No acute abnormality. Electronically Signed   By: Marlan Palau M.D.   On: 05/12/2015 15:32   US Renal  05/16/2015  CLINICAL DATA:  75 year old female with acute on chronic renal failure EXAM: RENAL / URINARY TRACT ULTRASOUND COMPLETE COMPARISON:  Prior renal ultrasound 03/06/2014 FINDINGS: Right Kidney: Length: 8.4 cm. No evidence of hydronephrosis. Significant echogenicity of the renal parenchyma with increased conspicuity of the corticomedullary interface. There is mild perinephric fluid consistent with perinephric sweat. Left Kidney: Length: 9.1 cm. No evidence of hydronephrosis. Significant echogenicity of the renal parenchyma with increased conspicuity of the corticomedullary interface. There is mild perinephric fluid consistent with perinephric sweat. Bladder: Appears normal for degree of bladder distention. IMPRESSION: 1. Significantly echogenic kidneys bilaterally consistent with medical renal disease. 2. Mild perinephric fluid (perinephric 'sweat' sign) bilaterally which can be seen in the setting of acute renal failure. 3. No evidence of hydronephrosis. Electronically Signed   By: Malachy Moan M.D.   On: 05/16/2015 15:42   Dg Chest Port 1 View  05/19/2015  CLINICAL DATA:  Right-sided chest pain, shortness of breath, dry cough for the past 3 weeks. History of CHF.  EXAM: PORTABLE CHEST 1 VIEW COMPARISON:   05/12/2015; 03/08/2014; 03/05/2014; 02/25/2013 FINDINGS: Grossly unchanged enlarged cardiac silhouette and mediastinal contours. Interval placement of right upper extremity approach PICC line with tip projected over the superior cavoatrial junction. Interval development of left basilar potential airspace opacities with development of ill-defined heterogeneous slightly nodular interstitial opacities within the peripheral aspect of the left lung. No pneumothorax. Unchanged bones. IMPRESSION: 1. Worsening ill-defined nodular interstitial opacities within the left lung with relative area of consolidation within the left lower lung - findings worrisome for multifocal infection, including potential atypical etiologies. 2. Right upper extremity approach PICC line tip terminates over the superior cavoatrial junction. Electronically Signed   By: Simonne Come M.D.   On: 05/19/2015 18:47   Dg Abd 2 Views  05/17/2015  CLINICAL DATA:  Left lower quadrant pain for 3 days EXAM: ABDOMEN - 2 VIEW COMPARISON:  05/12/2015 FINDINGS: The stomach is distended with air and ingested material. Scattered large and small bowel gas is noted without obstructive change. No free air is seen. No abnormal mass or abnormal calcifications are noted. There changes of prior L2 fracture stable in appearance. IMPRESSION: Somewhat distended stomach. No obstructive changes are seen.  No free air is noted. Electronically Signed   By: Alcide Clever M.D.   On: 05/17/2015 17:06    Assessment/Plan  Physical deconditioning Will have her work with physical therapy and occupational therapy team to help with gait training and muscle strengthening exercises.fall precautions. Skin care. Encourage to be out of bed.   HCAP Continue and complete course of augmentin today, encouraged to use incentive spirometer. Her breathing is stable. Continue symbicort and spiriva  Acute on chronic CHF Stable breathing, appears euvolemic. Continue lasix 20 mg daily,  isosorbide-hydralazine 20-37.5 mg tid, coreg 6.25 mg bid. Check daily weight. Check bmp  Protein calorie malnutrition Get dietary consult, encourage po intake. Continue feeding supplement  Dysphagia To work with SLP team, aspiration precautions  HTN Stable BP, continue bidil 20-37.5 mg tid, coreg and monitor bp  CAD Chest pain free. Continue aspirin, coreg, isosorbide and monitor  Gout No recent flare up. continuue colchicine and allopurinol  Seizure disorder Continue keppra and vimpat as above  ckd stage 3 Continue to monitor renal function  Old CVA Continue apsiirn and bp medication  Cognitive impairment Followed by neurology as outpatient. Get SLP to work on [employer], building services   Goals of care: short term rehabilitation   Labs/tests ordered: cbc, bmp  Family/ staff Communication: reviewed care plan with patient and nursing supervisor    Oneal Grout, MD Internal Medicine [facility] [address] [place], [place] [address] Cell Phone (Monday-Friday 8 am - 5 pm): [phone] On Call: [phone] and follow prompts after 5 pm and on weekends Office Phone: [phone] Office Fax: [phone]

## 2015-05-28 ENCOUNTER — Non-Acute Institutional Stay (SKILLED_NURSING_FACILITY): Payer: Commercial Managed Care - HMO | Admitting: Adult Health

## 2015-05-28 ENCOUNTER — Encounter: Payer: Self-pay | Admitting: Adult Health

## 2015-05-28 DIAGNOSIS — Z8673 Personal history of transient ischemic attack (TIA), and cerebral infarction without residual deficits: Secondary | ICD-10-CM

## 2015-05-28 DIAGNOSIS — I5042 Chronic combined systolic (congestive) and diastolic (congestive) heart failure: Secondary | ICD-10-CM | POA: Diagnosis not present

## 2015-05-28 DIAGNOSIS — R569 Unspecified convulsions: Secondary | ICD-10-CM | POA: Diagnosis not present

## 2015-05-28 DIAGNOSIS — N183 Chronic kidney disease, stage 3 unspecified: Secondary | ICD-10-CM

## 2015-05-28 DIAGNOSIS — M1A9XX0 Chronic gout, unspecified, without tophus (tophi): Secondary | ICD-10-CM | POA: Diagnosis not present

## 2015-05-28 DIAGNOSIS — R5381 Other malaise: Secondary | ICD-10-CM

## 2015-05-28 DIAGNOSIS — I130 Hypertensive heart and chronic kidney disease with heart failure and stage 1 through stage 4 chronic kidney disease, or unspecified chronic kidney disease: Secondary | ICD-10-CM | POA: Diagnosis not present

## 2015-05-28 DIAGNOSIS — I509 Heart failure, unspecified: Secondary | ICD-10-CM

## 2015-05-28 DIAGNOSIS — I25119 Atherosclerotic heart disease of native coronary artery with unspecified angina pectoris: Secondary | ICD-10-CM | POA: Diagnosis not present

## 2015-05-28 NOTE — Progress Notes (Signed)
Patient ID: [name], female   DOB: August 28, 1940, 75 y.o.   MRN: [id number]    DATE:    05/28/15  MRN:  [id number]  BIRTHDAY: [birth year]  Facility:  Nursing Home Location:  [facility]  Nursing Home Room Number: 1204-P  LEVEL OF CARE:  SNF (31)  Contact Information    Name Relation Home Work Mobile   Luther Son   [phone]   Saperstein,Latoya Daughter  [phone]       Code Status History    Date Active Date Inactive Code Status Order ID Comments User Context   05/14/2015  9:05 AM 05/23/2015  9:52 PM Full Code [id number]  Marinda Elk, MD Inpatient   03/05/2014  9:32 PM 03/09/2014  4:18 PM Full Code [id number]  Lynden Oxford, MD ED   02/24/2013  9:37 AM 02/28/2013  5:17 PM Full Code [id number]  Cleora Fleet, MD Inpatient   02/12/2013 12:34 AM 02/14/2013  9:32 PM Full Code [id number]  Therisa Doyne, MD Inpatient   01/04/2013  4:47 PM 01/15/2013  4:48 PM Full Code [id number]  Jacquelynn Cree, PA-C Inpatient   12/28/2012  1:41 PM 01/04/2013  4:47 PM Full Code [id number]  Alba Cory, MD Inpatient       Chief Complaint  Patient presents with  . Discharge Note    HISTORY OF PRESENT ILLNESS:  This is a 75 year old female who is for discharge home with Home health PT.  She has been admitted to [facility] on 05/23/15 from [facility]. She has PMH of CVA, seizure, MI, ischemic cardiomyopathy with an EF of 35%. She was hospitalized due to acute on chronic combined systolic and diastolic heart failure. Probable etiology could be due to noncompliance (not taking her Lasix) versus end-stage CHF. She was given IV lasix and Milrinone drip per cardiology consult. 2-D echo with EF of 25-30% with grade 3 diastolic dysfunction with wide-open tricuspid regurgitation. Per cardiology overall cardiovascular prognosis is very poor and palliative consult was done while in the hospital. Chest x-ray showed pneumonia and was empirically treated with IV  Vancomycin, IV Cefepime, Mucinex, nebulizer treatments, Pulmicort and Brovana.  Patient was admitted to this facility for short-term rehabilitation after the patient's recent hospitalization.  Patient has completed SNF rehabilitation and therapy has cleared the patient for discharge.  PAST MEDICAL HISTORY:  Past Medical History  Diagnosis Date  . MI (myocardial infarction) (HCC)   . Hypertension   . Stroke (HCC)   . Congestive heart failure (CHF) (HCC)     EF 35% Nov 2014  . Epilepsy (HCC)   . Smoker      CURRENT MEDICATIONS: Reviewed    Medication List       This list is accurate as of: 05/28/15  4:37 PM.  Always use your most recent med list.               allopurinol 100 MG tablet  Commonly known as:  ZYLOPRIM  Take 200 mg by mouth daily.     aspirin 325 MG tablet  Take 325 mg by mouth every morning.     budesonide-formoterol 80-4.5 MCG/ACT inhaler  Commonly known as:  SYMBICORT  Inhale 2 puffs into the lungs 2 (two) times daily.     calcitRIOL 0.25 MCG capsule  Commonly known as:  ROCALTROL  Take 0.25 mcg by mouth daily.     carvedilol 6.25 MG tablet  Commonly known as:  COREG  Take 1 tablet (6.25 mg  total) by mouth 2 (two) times daily with a meal.     COLCRYS 0.6 MG tablet  Generic drug:  colchicine  Take 0.6 mg by mouth daily.     cyclobenzaprine 5 MG tablet  Commonly known as:  FLEXERIL  Take 5 mg by mouth at bedtime.     furosemide 20 MG tablet  Commonly known as:  LASIX  Take 1 tablet (20 mg total) by mouth daily.     HYDROcodone-homatropine 5-1.5 MG/5ML syrup  Commonly known as:  HYCODAN  Take 5 mLs by mouth every 4 (four) hours as needed for cough.     isosorbide-hydrALAZINE 20-37.5 MG tablet  Commonly known as:  BIDIL  Take 1 tablet by mouth 3 (three) times daily.     levETIRAcetam 500 MG tablet  Commonly known as:  KEPPRA  Take 1 tablet (500 mg total) by mouth at bedtime.     NUTRITIONAL SUPPLEMENT PO  Take 90 mLs by mouth 2 (two)  times daily between meals.     ondansetron 4 MG tablet  Commonly known as:  ZOFRAN  Take 1 tablet (4 mg total) by mouth every 8 (eight) hours as needed for nausea or vomiting.     tiotropium 18 MCG inhalation capsule  Commonly known as:  SPIRIVA HANDIHALER  Place 1 capsule (18 mcg total) into inhaler and inhale daily.     VIMPAT 100 MG Tabs  Generic drug:  Lacosamide  Take 1.5 tablets (150 mg total) by mouth 2 (two) times daily.        No Known Allergies   REVIEW OF SYSTEMS:  GENERAL: no change in appetite, no fatigue, no weight changes, no fever, chills or weakness EYES: Denies change in vision, dry eyes, eye pain, itching or discharge EARS: Denies change in hearing, ringing in ears, or earache NOSE: Denies nasal congestion or epistaxis MOUTH and THROAT: Denies oral discomfort, gingival pain or bleeding, pain from teeth or hoarseness   RESPIRATORY: no cough, SOB, DOE, wheezing, hemoptysis CARDIAC: no chest pain, edema or palpitations GI: no abdominal pain, diarrhea, constipation, heart burn, nausea or vomiting GU: Denies dysuria, frequency, hematuria, incontinence, or discharge PSYCHIATRIC: Denies feeling of depression or anxiety. No report of hallucinations, insomnia, paranoia, or agitation   PHYSICAL EXAMINATION  GENERAL APPEARANCE: Well nourished. In no acute distress. Normal body habitus SKIN:  Skin is warm and dry.  HEAD: Normal in size and contour. No evidence of trauma EYES: Lids open and close normally. No blepharitis, entropion or ectropion. PERRL. Conjunctivae are clear and sclerae are white. Lenses are without opacity EARS: Pinnae are normal. Patient hears normal voice tunes of the examiner MOUTH and THROAT: Lips are without lesions. Oral mucosa is moist and without lesions. Tongue is normal in shape, size, and color and without lesions NECK: supple, trachea midline, no neck masses, no thyroid tenderness, no thyromegaly LYMPHATICS: no LAN in the neck, no  supraclavicular LAN RESPIRATORY: breathing is even & unlabored, BS CTAB CARDIAC: RRR, no murmur,no extra heart sounds, no edema GI: abdomen soft, normal BS, no masses, no tenderness, no hepatomegaly, no splenomegaly EXTREMITIES:  Able to move X 4 extremities PSYCHIATRIC: Alert and oriented X 3. Affect and behavior are appropriate  LABS/RADIOLOGY: Labs reviewed: 05/26/15  WBC 6.9 hemoglobin 8.9 hematocrit 27.9 MCV 102.2 platelet 426 sodium 139  K4.7 glucose 90 BUN 37 creatinine 1.83 calcium 8.9 Basic Metabolic Panel:  Recent Labs  16/10/96 1151  05/21/15 0500 05/22/15 0533 05/23/15 05/23/15 0500  NA  --   < >  142 138 135* 135  K  --   < > 4.7 5.0  --  4.6  CL  --   < > 107 110  --  107  CO2  --   < > 22 21*  --  19*  GLUCOSE  --   < > 114* 105*  --  98  BUN  --   < > 28* 42* 40* 40*  CREATININE  --   < > 1.64* 2.02* 2.0* 2.02*  CALCIUM  --   < > 9.3 9.2  --  8.9  MG 2.2  --   --  2.3  --   --   < > = values in this interval not displayed. Liver Function Tests:  Recent Labs  05/14/15 0356  AST 85*  ALT 74*  ALKPHOS 158*  BILITOT 1.0  PROT 7.9  ALBUMIN 4.3     Recent Labs  05/16/15 1125 05/19/15 0720  AMMONIA 40* 24   CBC:  Recent Labs  05/14/15 0356  05/21/15 0500 05/22/15 0533 05/23/15 05/23/15 0500  WBC 9.6  < > 8.6 7.9 7.8 7.8  NEUTROABS 7.4  --   --   --   --   --   HGB 10.4*  < > 8.8* 8.6*  --  8.3*  HCT 30.7*  < > 26.3* 25.0*  --  24.4*  MCV 95.0  < > 95.3 95.4  --  96.4  PLT 350  < > 330 331  --  343  < > = values in this interval not displayed.  Cardiac Enzymes:  Recent Labs  05/14/15 1151 05/14/15 1745 05/14/15 2337  TROPONINI 0.08* 0.08* 0.08*   CBG:  Recent Labs  05/14/15 0210  GLUCAP 116*     Dg Chest 2 View  05/12/2015  CLINICAL DATA:  Low back pain.  Posterior chest pain. EXAM: CHEST  2 VIEW COMPARISON:  03/08/2014 FINDINGS: Cardiac enlargement is similar to the prior study. Negative for heart failure. Small bilateral  pleural effusions. Mild streaky density in both lung bases consistent with atelectasis or infiltrate. No mass lesion. Nipple shadow on the left noted Severe compression fracture mid thoracic spine of indeterminate age. Most recent lateral view was 02/25/2013 and the fracture was not present at that time IMPRESSION: Cardiac enlargement with small pleural effusions. Negative for heart failure Mild bibasilar atelectasis/infiltrate. Severe mid thoracic compression fracture indeterminate age. Electronically Signed   By: Marlan Palau M.D.   On: 05/12/2015 15:41   Dg Lumbar Spine 2-3 Views  05/12/2015  CLINICAL DATA:  Low back pain EXAM: LUMBAR SPINE - 2-3 VIEW COMPARISON:  02/19/2013 FINDINGS: Moderate compression fracture L2 is chronic and unchanged. Mild to moderate levoscoliosis unchanged. No new fracture. L5 is partially sacralized. Generalized osteopenia. No significant disc degeneration. 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 No acute abnormality. Electronically Signed   By: Marlan Palau M.D.   On: 05/12/2015 15:32   US Renal  05/16/2015  CLINICAL DATA:  75 year old female with acute on chronic renal failure EXAM: RENAL / URINARY TRACT ULTRASOUND COMPLETE COMPARISON:  Prior renal ultrasound 03/06/2014 FINDINGS: Right Kidney: Length: 8.4 cm. No evidence of hydronephrosis. Significant echogenicity of the renal parenchyma with increased conspicuity of the corticomedullary interface. There is mild perinephric fluid consistent with perinephric sweat. Left Kidney: Length: 9.1 cm. No evidence of hydronephrosis. Significant echogenicity of the renal parenchyma with increased conspicuity of the corticomedullary interface. There is mild perinephric fluid consistent with perinephric sweat. Bladder: Appears normal  for degree of bladder distention. IMPRESSION: 1. Significantly echogenic kidneys bilaterally consistent with medical renal disease. 2. Mild perinephric fluid (perinephric 'sweat' sign)  bilaterally which can be seen in the setting of acute renal failure. 3. No evidence of hydronephrosis. Electronically Signed   By: Malachy Moan M.D.   On: 05/16/2015 15:42   Dg Chest Port 1 View  05/19/2015  CLINICAL DATA:  Right-sided chest pain, shortness of breath, dry cough for the past 3 weeks. History of CHF. EXAM: PORTABLE CHEST 1 VIEW COMPARISON:  05/12/2015; 03/08/2014; 03/05/2014; 02/25/2013 FINDINGS: Grossly unchanged enlarged cardiac silhouette and mediastinal contours. Interval placement of right upper extremity approach PICC line with tip projected over the superior cavoatrial junction. Interval development of left basilar potential airspace opacities with development of ill-defined heterogeneous slightly nodular interstitial opacities within the peripheral aspect of the left lung. No pneumothorax. Unchanged bones. IMPRESSION: 1. Worsening ill-defined nodular interstitial opacities within the left lung with relative area of consolidation within the left lower lung - findings worrisome for multifocal infection, including potential atypical etiologies. 2. Right upper extremity approach PICC line tip terminates over the superior cavoatrial junction. Electronically Signed   By: Simonne Come M.D.   On: 05/19/2015 18:47   Dg Abd 2 Views  05/17/2015  CLINICAL DATA:  Left lower quadrant pain for 3 days EXAM: ABDOMEN - 2 VIEW COMPARISON:  05/12/2015 FINDINGS: The stomach is distended with air and ingested material. Scattered large and small bowel gas is noted without obstructive change. No free air is seen. No abnormal mass or abnormal calcifications are noted. There changes of prior L2 fracture stable in appearance. IMPRESSION: Somewhat distended stomach. No obstructive changes are seen.  No free air is noted. Electronically Signed   By: Alcide Clever M.D.   On: 05/17/2015 17:06    ASSESSMENT/PLAN:  Physical deconditioning - for Home health PT  Combined systolic and diastolic congestive heart  failure - continue Lasix 20 mg 1 tab PO daily, Coreg 6.25 mg 1 tab by mouth twice a day, Bidil 20-37.5 mg 1 tab PO daily  Pneumonia - Resolved  Chronic kidney disease, stage 3 - continue Calcitriol 0.25 g capsule daily; creatinine 2.02, recheck creatinine 1.83, better  Seizure disorder - continue Vimpat 100 mg take 1 1/2 tab= 150 mg twice a day, Keppra 500 mg 1 tab by mouth daily at bedtime  History of stroke - continue aspirin EC 325 mg 1 tab by mouth daily a.m., cyclobenzaprine 5 mg 1 tab by mouth daily at bedtime for muscle spasm  Gout -  continue Allopurinol 100 mg daily, Colcrys 0.6 mg 1 tab by mouth daily  Hypertensive heart disease - continue Coreg 6.25 mg 1 tab by mouth twice a day, Bidil 20-30 7.5 mg 1 tab by mouth 3 times a day  CAD - continue ASA, Coreg and Isosorbide    I have filled out patient's discharge paperwork and written prescriptions.  Patient will receive home health PT.  Total discharge time: Less than 30 minutes  Discharge time involved coordination of the discharge process with Child psychotherapist, nursing staff and therapy department. Medical justification for home health services verified.     [facility], NP [employer] [phone]

## 2015-06-01 ENCOUNTER — Other Ambulatory Visit: Payer: Self-pay | Admitting: Adult Health

## 2015-06-01 DIAGNOSIS — M109 Gout, unspecified: Secondary | ICD-10-CM | POA: Diagnosis not present

## 2015-06-01 DIAGNOSIS — I5042 Chronic combined systolic (congestive) and diastolic (congestive) heart failure: Secondary | ICD-10-CM | POA: Diagnosis not present

## 2015-06-01 DIAGNOSIS — G40909 Epilepsy, unspecified, not intractable, without status epilepticus: Secondary | ICD-10-CM | POA: Diagnosis not present

## 2015-06-01 DIAGNOSIS — I255 Ischemic cardiomyopathy: Secondary | ICD-10-CM | POA: Diagnosis not present

## 2015-06-01 DIAGNOSIS — I130 Hypertensive heart and chronic kidney disease with heart failure and stage 1 through stage 4 chronic kidney disease, or unspecified chronic kidney disease: Secondary | ICD-10-CM | POA: Diagnosis not present

## 2015-06-01 DIAGNOSIS — E43 Unspecified severe protein-calorie malnutrition: Secondary | ICD-10-CM | POA: Diagnosis not present

## 2015-06-01 DIAGNOSIS — J449 Chronic obstructive pulmonary disease, unspecified: Secondary | ICD-10-CM | POA: Diagnosis not present

## 2015-06-01 DIAGNOSIS — N183 Chronic kidney disease, stage 3 (moderate): Secondary | ICD-10-CM | POA: Diagnosis not present

## 2015-06-01 DIAGNOSIS — D649 Anemia, unspecified: Secondary | ICD-10-CM | POA: Diagnosis not present

## 2015-06-03 ENCOUNTER — Encounter: Payer: Self-pay | Admitting: Physician Assistant

## 2015-06-03 NOTE — Progress Notes (Signed)
Cardiology Office Note    Date:  06/04/2015   ID:  [name], DOB Nov 03, 1940, MRN [id number]  PCP:  [name], MD  Cardiologist:  Dr. Katrinka Blazing    Post hospital follow up  History of Present Illness:  [name] is a 75 y.o. female with a history of HTN, CVA, CKD and chronic biventricular CHF and wide open TR who presents to clinic for post hospital follow up.   She had an emergent cath in 2010 for ECG concerning for STEMI. This revealed no significant CAD and normal LV function. 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   2D ECHO in 12/2012, which was done for CVA, showed EF of 35-40% with diffuse HK, mild MR and PA pressure 48.   Interestingly, she has not been followed by a cardiologist and has followed with her PCP for her CHF.    She was admitted from 4/15-4/22/17 for acute on chronic low output CHF. Echo demonstrated biventricular failure with LVEF 25-30% and also dilated and hypocontractile right ventricle. Diuresis was complicated by ARF (creat 3.17 on admission) and IV milrinone was added to augment diuresis. Co-Ox of 43% prior to starting milrinone. Her renal function improved to 1.53 on ionotropic therapy. She was weaned off milrinone. She was restarted on low dose lasix, Coreg and started on Bidil. Discharge creat 2.02 .  Today she presents to clinic for follow up. Since leaving the hospital she is feeling much better. SOB much better. No LE edema, orthopnea or PND. No CP. No dizziness or syncope. SHe has been trying to watch her salt and fluid intake.    Past Medical History  Diagnosis Date  . Hypertension   . Stroke (HCC)   . Congestive heart failure (CHF) (HCC)     EF 35% Nov 2014  . Epilepsy (HCC)   . Smoker     Past Surgical History  Procedure Laterality Date  . No past surgeries      Current Medications: Outpatient Prescriptions Prior to Visit  Medication Sig Dispense Refill  .  allopurinol (ZYLOPRIM) 100 MG tablet Take 200 mg by mouth daily.   5  . budesonide-formoterol (SYMBICORT) 80-4.5 MCG/ACT inhaler Inhale 2 puffs into the lungs 2 (two) times daily. 1 Inhaler 3  . calcitRIOL (ROCALTROL) 0.25 MCG capsule Take 0.25 mcg by mouth daily.    . carvedilol (COREG) 6.25 MG tablet Take 1 tablet (6.25 mg total) by mouth 2 (two) times daily with a meal. 60 tablet 3  . colchicine (COLCRYS) 0.6 MG tablet Take 0.6 mg by mouth as directed.     . furosemide (LASIX) 20 MG tablet Take 1 tablet (20 mg total) by mouth daily. 30 tablet 1  . levETIRAcetam (KEPPRA) 500 MG tablet Take 1 tablet (500 mg total) by mouth at bedtime. 90 tablet 1  . tiotropium (SPIRIVA HANDIHALER) 18 MCG inhalation capsule Place 1 capsule (18 mcg total) into inhaler and inhale daily. 30 capsule 3  . VIMPAT 100 MG TABS Take 1.5 tablets (150 mg total) by mouth 2 (two) times daily. 90 tablet 3  . aspirin 325 MG tablet Take 325 mg by mouth every morning. Reported on 06/04/2015    . isosorbide-hydrALAZINE (BIDIL) 20-37.5 MG tablet Take 1 tablet by mouth 3 (three) times daily. (Patient not taking: Reported on 06/04/2015) 90 tablet 3  . cyclobenzaprine (FLEXERIL) 5 MG tablet Take 5 mg by mouth at bedtime.    Marland Kitchen HYDROcodone-homatropine (HYCODAN) 5-1.5  MG/5ML syrup Take 5 mLs by mouth every 4 (four) hours as needed for cough. 120 mL 0  . Nutritional Supplements (NUTRITIONAL SUPPLEMENT PO) Take 90 mLs by mouth 2 (two) times daily between meals.    . ondansetron (ZOFRAN) 4 MG tablet Take 1 tablet (4 mg total) by mouth every 8 (eight) hours as needed for nausea or vomiting. 12 tablet 0   No facility-administered medications prior to visit.     Allergies:   Review of patient's allergies indicates no known allergies.   Social History   Social History  . Marital Status: Single    Spouse Name: N/A  . Number of Children: 3  . Years of Education: 13   Occupational History  .      Retired   Social History Main Topics  .  Smoking status: Current Some Day Smoker -- 0.10 packs/day    Types: Cigarettes  . Smokeless tobacco: Never Used  . Alcohol Use: 0.0 oz/week    0 Standard drinks or equivalent per week     Comment: occasional  . Drug Use: No  . Sexual Activity: No   Other Topics Concern  . None   Social History Narrative   Patient lives at home alone.    Retired.   Education one year of college.   Left handed.   Caffeine one cup of coffee daily and one cup of tea.     Family History:  The patient's family history includes Anemia in her daughter; Cancer in her father.   ROS:   Please see the history of present illness.    ROS All other systems reviewed and are negative.   PHYSICAL EXAM:   VS:  BP 116/92 mmHg  Pulse 100  Ht  (1.575 m)  Wt 85 lb (38.556 kg)  BMI 15.54 kg/m2  SpO2 99%   GEN: Well nourished, well developed, in no acute distressThin AA female HEENT: normal Neck: no JVD, carotid bruits, or masses Cardiac: RRR; no murmurs, rubs, or gallops,no edema  Respiratory:  clear to auscultation bilaterally, normal work of breathing GI: soft, nontender, nondistended, + BS MS: no deformity or atrophy Skin: warm and dry, no rash Neuro:  Alert and Oriented x 3, Strength and sensation are intact Psych: euthymic mood, full affect  Wt Readings from Last 3 Encounters:  06/04/15 85 lb (38.556 kg)  05/28/15 84 lb (38.102 kg)  05/26/15 84 lb (38.102 kg)      Studies/Labs Reviewed:    EKG:  EKG is NOT ordered today.   Recent Labs: 05/14/2015: ALT 74*; B Natriuretic Peptide >4500.0* 05/16/2015: TSH 1.765 05/22/2015: Magnesium 2.3 05/23/2015: BUN 40*; Creatinine, Ser 2.02*; Hemoglobin 8.3*; Platelets 343; Potassium 4.6; Sodium 135   Lipid Panel    Component Value Date/Time   CHOL 175 03/06/2014 0600   TRIG 73 03/06/2014 0600   HDL 68 03/06/2014 0600   CHOLHDL 2.6 03/06/2014 0600   VLDL 15 03/06/2014 0600   LDLCALC 92 03/06/2014 0600    Additional studies/ records that were  reviewed today include:  ECHO: 05/15/2015 - Left ventricle: The cavity size was normal. Wall thickness was  increased in a pattern of mild LVH. Systolic function was  severely reduced. The estimated ejection fraction was in the  range of 25% to 30%. Doppler parameters are consistent with a  reversible restrictive pattern, indicative of decreased left  ventricular diastolic compliance and/or increased left atrial  pressure (grade 3 diastolic dysfunction). - Ventricular septum: The contour showed diastolic flattening. -  Aortic valve: There was trivial regurgitation. - Mitral valve: There was mild regurgitation. - Right ventricle: The cavity size was severely dilated. Systolic  function was moderately to severely reduced. - Right atrium: The atrium was moderately dilated. - Tricuspid valve: There was wide-open regurgitation. Impressions: - The LV systolic and diastolic function are severely reduced.  The TV has wide open TR.  The RV / RA gradient was measured only at 15 ( giving an  estimated PA pressure of 30)  The RV / PA pressure appears to be higher than that - the RV is  enlarged . There is flattening of the ventricular septum in  diastole.     ASSESSMENT:    1. Biventricular heart failure with reduced left ventricular function (HCC)   2. TR (tricuspid regurgitation)   3. Essential hypertension   4. CKD (chronic kidney disease), unspecified stage   5. History of CVA (cerebrovascular accident)      PLAN:  In order of problems listed above:  Chronic biventricular systolic and diastolic CHF: per Dr. Michaelle Copas progress note in the hospital, her prognosis is poor. Today, she is doing surprisingly well. She has not been taking the BiDil because she thinks it caused her to have bad dreams. I have recommended that she retry this medication because that is not a typical symptom from this medication. Continue Coreg and Bidil as well as lasix 20mg  daily. Will repeat 2D  ECHO in 2 months and if EF still low then she may need to be referred for ICD or CRT-D.   Wide open TR: conservative measures.    HTN: BP well controlled on current regimen   CKD: creat ~ 2 at discharge. Will recheck BMET today.   Hx of CVA: continue ASA.   Medication Adjustments/Labs and Tests Ordered: Current medicines are reviewed at length with the patient today.  Concerns regarding medicines are outlined above.  Medication changes, Labs and Tests ordered today are listed in the Patient Instructions below. Patient Instructions  Medication Instructions:  Your physician recommends that you continue on your current medications as directed. Please refer to the Current Medication list given to you today.  Labwork: TODAY:  BMET  Testing/Procedures: Your physician has requested that you have an echocardiogram IN 2 MONTHS. Echocardiography is a painless test that uses sound waves to create images of your heart. It provides your doctor with information about the size and shape of your heart and how well your heart's chambers and valves are working. This procedure takes approximately one hour. There are no restrictions for this procedure.   Follow-Up: Your physician recommends that you schedule a follow-up appointment in: 2 MONTHS WITH DR. Katrinka Blazing (AFTER ECHO)   Any Other Special Instructions Will Be Listed Below (If Applicable).  Echocardiogram An echocardiogram, or echocardiography, uses sound waves (ultrasound) to produce an image of your heart. The echocardiogram is simple, painless, obtained within a short period of time, and offers valuable information to your health care provider. The images from an echocardiogram can provide information such as:  Evidence of coronary artery disease (CAD).  Heart size.  Heart muscle function.  Heart valve function.  Aneurysm detection.  Evidence of a past heart attack.  Fluid buildup around the heart.  Heart muscle thickening.  Assess  heart valve function. LET [facility] CARE PROVIDER KNOW ABOUT:  Any allergies you have.  All medicines you are taking, including vitamins, herbs, eye drops, creams, and over-the-counter medicines.  Previous problems you or members of your family  have had with the use of anesthetics.  Any blood disorders you have.  Previous surgeries you have had.  Medical conditions you have.  Possibility of pregnancy, if this applies. BEFORE THE PROCEDURE  No special preparation is needed. Eat and drink normally.  PROCEDURE   In order to produce an image of your heart, gel will be applied to your chest and a wand-like tool (transducer) will be moved over your chest. The gel will help transmit the sound waves from the transducer. The sound waves will harmlessly bounce off your heart to allow the heart images to be captured in real-time motion. These images will then be recorded.  You may need an IV to receive a medicine that improves the quality of the pictures. AFTER THE PROCEDURE You may return to your normal schedule including diet, activities, and medicines, unless your health care provider tells you otherwise.   This information is not intended to replace advice given to you by your health care provider. Make sure you discuss any questions you have with your health care provider.   Document Released: 01/15/2000 Document Revised: 02/07/2014 Document Reviewed: 09/24/2012 Elsevier Interactive Patient Education [employer]    If you need a refill on your cardiac medications before your next appointment, please call your pharmacy.       Billy Fischer, PA-C  06/04/2015 3:00 PM    [facility] [address] [place], [place], [place]  [address] Phone: [phone]; Fax: [phone]

## 2015-06-04 ENCOUNTER — Encounter: Payer: Self-pay | Admitting: Physician Assistant

## 2015-06-04 ENCOUNTER — Other Ambulatory Visit: Payer: Self-pay

## 2015-06-04 ENCOUNTER — Ambulatory Visit (INDEPENDENT_AMBULATORY_CARE_PROVIDER_SITE_OTHER): Payer: Commercial Managed Care - HMO | Admitting: Physician Assistant

## 2015-06-04 VITALS — BP 116/92 | HR 100 | Ht 62.0 in | Wt 85.0 lb

## 2015-06-04 DIAGNOSIS — I071 Rheumatic tricuspid insufficiency: Secondary | ICD-10-CM

## 2015-06-04 DIAGNOSIS — I10 Essential (primary) hypertension: Secondary | ICD-10-CM | POA: Diagnosis not present

## 2015-06-04 DIAGNOSIS — I5041 Acute combined systolic (congestive) and diastolic (congestive) heart failure: Secondary | ICD-10-CM

## 2015-06-04 DIAGNOSIS — I509 Heart failure, unspecified: Secondary | ICD-10-CM

## 2015-06-04 DIAGNOSIS — Z8673 Personal history of transient ischemic attack (TIA), and cerebral infarction without residual deficits: Secondary | ICD-10-CM

## 2015-06-04 DIAGNOSIS — I50814 Right heart failure due to left heart failure: Secondary | ICD-10-CM

## 2015-06-04 DIAGNOSIS — N189 Chronic kidney disease, unspecified: Secondary | ICD-10-CM | POA: Diagnosis not present

## 2015-06-04 LAB — BASIC METABOLIC PANEL
BUN: 34 mg/dL — ABNORMAL HIGH (ref 7–25)
CO2: 21 mmol/L (ref 20–31)
Calcium: 9.5 mg/dL (ref 8.6–10.4)
Chloride: 105 mmol/L (ref 98–110)
Creat: 1.42 mg/dL — ABNORMAL HIGH (ref 0.60–0.93)
Glucose, Bld: 83 mg/dL (ref 65–99)
Potassium: 5.1 mmol/L (ref 3.5–5.3)
Sodium: 136 mmol/L (ref 135–146)

## 2015-06-04 NOTE — Patient Outreach (Signed)
[facility]) Care Management  06/04/2015  [name] [birth year] [id number]     EMMI-HF RED ON EMMI ALERT Day # 7&8 Date: 06/02/15, 06/03/15 Red Alert Reason: "weighed themselves today? No"  "went to follow up appointment? No"   Outreach attempt #1 to patient. Spoke with patient. Dicussed red alerts. She reports she has not weighed herself as she does not normally weigh often. Patient educated on importance of monitoring weight and alerting MD of changes in weight. She voiced understanding and will try to weigh more often. She reports that she has f/u appt today. She had appt at [facility] on yesterday. Patient voices some frustrations with seeing so many doctors. She states she has all her meds. She reports that a new med (unable to recall name of med) is making her not sleep at night and she stopped the med on yesterday. She will discuss further with MD during appt today. Denies any issues with transportation as son takes her to appts.    Plan: RN CM will make outreach attempt to patient within a week.  Antionette Fairy, RN,BSN,CCM [facility] Care Management Telephonic Care Management Coordinator Direct Phone: [phone] Toll Free: [phone] Fax: [phone]

## 2015-06-04 NOTE — Patient Instructions (Signed)
Medication Instructions:  Your physician recommends that you continue on your current medications as directed. Please refer to the Current Medication list given to you today.  Labwork: TODAY:  BMET  Testing/Procedures: Your physician has requested that you have an echocardiogram IN 2 MONTHS. Echocardiography is a painless test that uses sound waves to create images of your heart. It provides your doctor with information about the size and shape of your heart and how well your heart's chambers and valves are working. This procedure takes approximately one hour. There are no restrictions for this procedure.   Follow-Up: Your physician recommends that you schedule a follow-up appointment in: 2 MONTHS WITH DR. Katrinka Blazing (AFTER ECHO)   Any Other Special Instructions Will Be Listed Below (If Applicable).  Echocardiogram An echocardiogram, or echocardiography, uses sound waves (ultrasound) to produce an image of your heart. The echocardiogram is simple, painless, obtained within a short period of time, and offers valuable information to your health care provider. The images from an echocardiogram can provide information such as:  Evidence of coronary artery disease (CAD).  Heart size.  Heart muscle function.  Heart valve function.  Aneurysm detection.  Evidence of a past heart attack.  Fluid buildup around the heart.  Heart muscle thickening.  Assess heart valve function. LET [facility] CARE PROVIDER KNOW ABOUT:  Any allergies you have.  All medicines you are taking, including vitamins, herbs, eye drops, creams, and over-the-counter medicines.  Previous problems you or members of your family have had with the use of anesthetics.  Any blood disorders you have.  Previous surgeries you have had.  Medical conditions you have.  Possibility of pregnancy, if this applies. BEFORE THE PROCEDURE  No special preparation is needed. Eat and drink normally.  PROCEDURE   In order to  produce an image of your heart, gel will be applied to your chest and a wand-like tool (transducer) will be moved over your chest. The gel will help transmit the sound waves from the transducer. The sound waves will harmlessly bounce off your heart to allow the heart images to be captured in real-time motion. These images will then be recorded.  You may need an IV to receive a medicine that improves the quality of the pictures. AFTER THE PROCEDURE You may return to your normal schedule including diet, activities, and medicines, unless your health care provider tells you otherwise.   This information is not intended to replace advice given to you by your health care provider. Make sure you discuss any questions you have with your health care provider.   Document Released: 01/15/2000 Document Revised: 02/07/2014 Document Reviewed: 09/24/2012 Elsevier Interactive Patient Education [employer]    If you need a refill on your cardiac medications before your next appointment, please call your pharmacy.

## 2015-06-08 ENCOUNTER — Other Ambulatory Visit: Payer: Self-pay

## 2015-06-08 NOTE — Patient Outreach (Signed)
[facility]) Care Management  06/08/2015  Simrun Laroque 08/07/1940 [id number]    EMMI-HF Follow Up Call  Call placed to patient to follow up on previous call. She reports that she is doing fine. Denies any new issues or concerns. She did complete MD appt. She reports she discussed her med issues with MD and they were resolved. Patient also voices that issue regarding not sleeping well has gotten better and she is sleeping.   Plan: RN CM will notify [facility] administrative assistant of case status.   Antionette Fairy, RN,BSN,CCM [facility] Care Management Telephonic Care Management Coordinator Direct Phone: [phone] Toll Free: [phone] Fax: [phone]

## 2015-06-09 DIAGNOSIS — G40909 Epilepsy, unspecified, not intractable, without status epilepticus: Secondary | ICD-10-CM | POA: Diagnosis not present

## 2015-06-09 DIAGNOSIS — I255 Ischemic cardiomyopathy: Secondary | ICD-10-CM | POA: Diagnosis not present

## 2015-06-09 DIAGNOSIS — E43 Unspecified severe protein-calorie malnutrition: Secondary | ICD-10-CM | POA: Diagnosis not present

## 2015-06-09 DIAGNOSIS — J449 Chronic obstructive pulmonary disease, unspecified: Secondary | ICD-10-CM | POA: Diagnosis not present

## 2015-06-09 DIAGNOSIS — D649 Anemia, unspecified: Secondary | ICD-10-CM | POA: Diagnosis not present

## 2015-06-09 DIAGNOSIS — M109 Gout, unspecified: Secondary | ICD-10-CM | POA: Diagnosis not present

## 2015-06-09 DIAGNOSIS — I130 Hypertensive heart and chronic kidney disease with heart failure and stage 1 through stage 4 chronic kidney disease, or unspecified chronic kidney disease: Secondary | ICD-10-CM | POA: Diagnosis not present

## 2015-06-09 DIAGNOSIS — N183 Chronic kidney disease, stage 3 (moderate): Secondary | ICD-10-CM | POA: Diagnosis not present

## 2015-06-09 DIAGNOSIS — I5042 Chronic combined systolic (congestive) and diastolic (congestive) heart failure: Secondary | ICD-10-CM | POA: Diagnosis not present

## 2015-06-15 ENCOUNTER — Other Ambulatory Visit: Payer: Self-pay

## 2015-06-15 NOTE — Patient Outreach (Signed)
[facility]) Care Management  06/15/2015  [name] [birth year] [id number]     EMMI-HF RED ON EMMI ALERT Day # 18 Date: 06/13/15 Red Alert Reason: "weighed themselves today? No"    Outreach attempt #1 to patient. No answer at present. RN CM left HIPAA compliant voicemail along with contact info.   Plan: RN CM will make outreach attempt to patient within 24hrs.    Antionette Fairy, RN,BSN,CCM [facility] Care Management Telephonic Care Management Coordinator Direct Phone: [phone] Toll Free: [phone] Fax: [phone]

## 2015-06-16 ENCOUNTER — Other Ambulatory Visit: Payer: Self-pay

## 2015-06-16 DIAGNOSIS — N183 Chronic kidney disease, stage 3 (moderate): Secondary | ICD-10-CM | POA: Diagnosis not present

## 2015-06-16 DIAGNOSIS — I255 Ischemic cardiomyopathy: Secondary | ICD-10-CM | POA: Diagnosis not present

## 2015-06-16 DIAGNOSIS — J449 Chronic obstructive pulmonary disease, unspecified: Secondary | ICD-10-CM | POA: Diagnosis not present

## 2015-06-16 DIAGNOSIS — G40909 Epilepsy, unspecified, not intractable, without status epilepticus: Secondary | ICD-10-CM | POA: Diagnosis not present

## 2015-06-16 DIAGNOSIS — I130 Hypertensive heart and chronic kidney disease with heart failure and stage 1 through stage 4 chronic kidney disease, or unspecified chronic kidney disease: Secondary | ICD-10-CM | POA: Diagnosis not present

## 2015-06-16 DIAGNOSIS — I5042 Chronic combined systolic (congestive) and diastolic (congestive) heart failure: Secondary | ICD-10-CM | POA: Diagnosis not present

## 2015-06-16 DIAGNOSIS — M109 Gout, unspecified: Secondary | ICD-10-CM | POA: Diagnosis not present

## 2015-06-16 DIAGNOSIS — D649 Anemia, unspecified: Secondary | ICD-10-CM | POA: Diagnosis not present

## 2015-06-16 DIAGNOSIS — E43 Unspecified severe protein-calorie malnutrition: Secondary | ICD-10-CM | POA: Diagnosis not present

## 2015-06-16 NOTE — Patient Outreach (Signed)
[facility]) Care Management  06/16/2015  Makhia Moros 01-Jul-1940 [id number]   EMMI-HF RED ON EMMI ALERT Day # 18 Date: 06/13/15 Red Alert Reason: "weighed themselves today? No"    Outreach attempt #2 to patient. No answer at present. Unable to leave message.     Plan: RN CM will send unsuccessful outreach letter to patient and close case if no response from patient within 10 business days.  Antionette Fairy, RN,BSN,CCM [facility] Care Management Telephonic Care Management Coordinator Direct Phone: [phone] Toll Free: [phone] Fax: [phone]

## 2015-06-21 ENCOUNTER — Other Ambulatory Visit: Payer: Self-pay | Admitting: Adult Health

## 2015-06-22 ENCOUNTER — Other Ambulatory Visit: Payer: Self-pay | Admitting: Adult Health

## 2015-06-23 DIAGNOSIS — E43 Unspecified severe protein-calorie malnutrition: Secondary | ICD-10-CM | POA: Diagnosis not present

## 2015-06-23 DIAGNOSIS — M109 Gout, unspecified: Secondary | ICD-10-CM | POA: Diagnosis not present

## 2015-06-23 DIAGNOSIS — N183 Chronic kidney disease, stage 3 (moderate): Secondary | ICD-10-CM | POA: Diagnosis not present

## 2015-06-23 DIAGNOSIS — J449 Chronic obstructive pulmonary disease, unspecified: Secondary | ICD-10-CM | POA: Diagnosis not present

## 2015-06-23 DIAGNOSIS — I130 Hypertensive heart and chronic kidney disease with heart failure and stage 1 through stage 4 chronic kidney disease, or unspecified chronic kidney disease: Secondary | ICD-10-CM | POA: Diagnosis not present

## 2015-06-23 DIAGNOSIS — G40909 Epilepsy, unspecified, not intractable, without status epilepticus: Secondary | ICD-10-CM | POA: Diagnosis not present

## 2015-06-23 DIAGNOSIS — I255 Ischemic cardiomyopathy: Secondary | ICD-10-CM | POA: Diagnosis not present

## 2015-06-23 DIAGNOSIS — D649 Anemia, unspecified: Secondary | ICD-10-CM | POA: Diagnosis not present

## 2015-06-23 DIAGNOSIS — I5042 Chronic combined systolic (congestive) and diastolic (congestive) heart failure: Secondary | ICD-10-CM | POA: Diagnosis not present

## 2015-06-24 ENCOUNTER — Encounter: Payer: Self-pay | Admitting: Neurology

## 2015-06-24 ENCOUNTER — Ambulatory Visit (INDEPENDENT_AMBULATORY_CARE_PROVIDER_SITE_OTHER): Payer: Commercial Managed Care - HMO | Admitting: Neurology

## 2015-06-24 VITALS — BP 120/78 | HR 77 | Ht 61.0 in | Wt 84.8 lb

## 2015-06-24 DIAGNOSIS — R569 Unspecified convulsions: Secondary | ICD-10-CM

## 2015-06-24 MED ORDER — VIMPAT 100 MG PO TABS: 1.5000 | ORAL_TABLET | Freq: Two times a day (BID) | ORAL | Status: DC

## 2015-06-24 MED ORDER — LEVETIRACETAM 500 MG PO TABS: 500.0000 mg | ORAL_TABLET | Freq: Every day | ORAL | Status: DC

## 2015-06-24 NOTE — Progress Notes (Signed)
PATIENT: [name] DOB: 05-10-1940  REASON FOR VISIT: follow up- seizures HISTORY FROM: patient  HISTORY OF PRESENT ILLNESS: [name] is a 75 year old female with a history of seizures. She returns today for follow-up. The patient is currently taking Vimpat 150 mg twice a day as well as Keppra 500 mg at bedtime. She is tolerating these medications well. Denies any seizure activity since the last visit. She is able to complete all ADLs independently. She states that she typically does not drive. However if she does drive she limits it to short distances. Patient reports that her memory has remained stable. Denies any changes in her mood or behavior. Denies any new neurological symptoms. She reports she is having left abdominal discomfort when she eats. She states that she has had an x-ray that was normal. She has not followed up with her primary care provider. She returns today for an evaluation.  HISTORY 08/18/14: [name] is a 75 year old female with a history of seizures. She returns today for follow-up. The patient is currently taken Vimpat 100 mg twice a day and tolerating it well. The patient decreased her Keppra dose to half a tablet at bedtime. She denies any seizure activity since the last visit. She continues to complete all ADLs independently. She does not operate a motor vehicle. She denies any changes with her gait or balance. Patient states that she sleeps well at night. The patient had to decrease her dose of Keppra because she was sleepy all the time. Family member reports that her sleepiness has improved since she decreased her Keppra dose. Patient feels that her memory has remained stable. Denies any changes with her memory. Her family member reports that "she does not forget anything." She denies any new medical issues. She returns today for an evaluation.  HISTORY 04/01/14: [name] is a 75 year old female with a history of seizures. She returns today for follow-up. The  patient is currently taken Vimpat 100 mg twice a day. And Keppra 750 mg twice a day. She reports that her seizures was controlled until the beginning of February. Patient states that she fell out of bed at the beginning of February and was taken to the hospital. While she was having x-rays completed she had a seizure event. She was later discharged from the hospital but had a seizure on the way out. She was then admitted for several days. Her Keppra was increased to 750 mg twice a day. Her son also reports that she was diagnosed with a urinary tract infection while in the hospital. Since being discharged from the hospital she has not had any additional seizure events. does not operate a motor vehicle. Denies any changes with her gait or balance. She states that she has a therapist that works with her. She uses a cane for ambulation. No new medical issues since last seen. Update 06/24/2015 ; she returns for follow-up after last visit 6 months ago. She continues to do well without recurrent seizures now for several years. She is tolerating Keppra 500 mg at night and Vimpat 150 mg twice daily without any significant dizziness, nausea, sleepiness, ataxia diplopia. She is suffering from constipation but has not discussed this with her primary care physician yet. She has received a jury summons and is asking me that I can help her get out of it. I explained to her that I do not believe I can medically support her not to do jury duty at the present time. She continues to have mild  short-term memory difficulties but then nonprogressive and not bothersome. REVIEW OF SYSTEMS: Out of a complete 14 system review of symptoms, the patient complains only of the following symptoms, and all other reviewed systems are negative.  Constipation, mild short-term memory difficulties ALLERGIES: No Known Allergies  HOME MEDICATIONS: Outpatient Prescriptions Prior to Visit  Medication Sig Dispense Refill  . allopurinol (ZYLOPRIM)  100 MG tablet Take 200 mg by mouth daily.   5  . aspirin 325 MG tablet Take 325 mg by mouth every morning. Reported on 06/04/2015    . budesonide-formoterol (SYMBICORT) 80-4.5 MCG/ACT inhaler Inhale 2 puffs into the lungs 2 (two) times daily. 1 Inhaler 3  . calcitRIOL (ROCALTROL) 0.25 MCG capsule Take 0.25 mcg by mouth daily.    . carvedilol (COREG) 6.25 MG tablet Take 1 tablet (6.25 mg total) by mouth 2 (two) times daily with a meal. 60 tablet 3  . colchicine (COLCRYS) 0.6 MG tablet Take 0.6 mg by mouth as directed.     . furosemide (LASIX) 20 MG tablet Take 1 tablet (20 mg total) by mouth daily. 30 tablet 1  . isosorbide-hydrALAZINE (BIDIL) 20-37.5 MG tablet Take 1 tablet by mouth 3 (three) times daily. 90 tablet 3  . tiotropium (SPIRIVA HANDIHALER) 18 MCG inhalation capsule Place 1 capsule (18 mcg total) into inhaler and inhale daily. 30 capsule 3  . levETIRAcetam (KEPPRA) 500 MG tablet Take 1 tablet (500 mg total) by mouth at bedtime. 90 tablet 1  . VIMPAT 100 MG TABS Take 1.5 tablets (150 mg total) by mouth 2 (two) times daily. 90 tablet 3   No facility-administered medications prior to visit.    PAST MEDICAL HISTORY: Past Medical History  Diagnosis Date  . Hypertension   . Stroke (HCC)   . Congestive heart failure (CHF) (HCC)   . Epilepsy (HCC)   . Smoker   . Seizures (HCC)     PAST SURGICAL HISTORY: Past Surgical History  Procedure Laterality Date  . No past surgeries      FAMILY HISTORY: Family History  Problem Relation Age of Onset  . Cancer Father   . Anemia Daughter     SOCIAL HISTORY: Social History   Social History  . Marital Status: Single    Spouse Name: N/A  . Number of Children: 3  . Years of Education: 13   Occupational History  .      Retired   Social History Main Topics  . Smoking status: Current Some Day Smoker -- 0.25 packs/day    Types: Cigarettes  . Smokeless tobacco: Never Used     Comment: 1/2 caigarettes a day  . Alcohol Use: 0.0  oz/week    0 Standard drinks or equivalent per week     Comment: occasional  . Drug Use: No  . Sexual Activity: No   Other Topics Concern  . Not on file   Social History Narrative   Patient lives at home alone.    Retired.   Education one year of college.   Left handed.   Caffeine one cup of coffee daily and one cup of tea.      PHYSICAL EXAM  Filed Vitals:   06/24/15 1036  BP: 120/78  Pulse: 77  Height: 5\' 1"  (1.549 m)  Weight: 84 lb 12.8 oz (38.465 kg)   Body mass index is 16.03 kg/(m^2).  MMSE - Mini Mental State Exam 12/22/2014 04/01/2014  Orientation to time 5 5  Orientation to Place 5 5  Registration 3 3  Attention/ Calculation 5 5  Recall 2 3  Language- name 2 objects 2 2  Language- repeat 1 1  Language- follow 3 step command 2 3  Language- read & follow direction 1 1  Write a sentence 1 1  Copy design 0 0  Total score 27 29     Generalized: Well developed, in no acute distress   Neurological examination  Mentation: Alert. Follows all commands speech and language fluent Cranial nerve II-XII: Pupils were equal round reactive to light. Extraocular movements were full, visual field were full on confrontational test. Facial sensation and strength were normal. Uvula tongue midline. Head turning and shoulder shrug  were normal and symmetric. Motor: The motor testing reveals 5 over 5 strength of all 4 extremities. Good symmetric motor tone is noted throughout.  Sensory: Sensory testing is intact to soft touch on all 4 extremities. No evidence of extinction is noted.  Coordination: Cerebellar testing reveals good finger-nose-finger and heel-to-shin bilaterally.  Gait and station: Gait is normal. Tandem gait is normal. Romberg is negative. No drift is seen.  Reflexes: Deep tendon reflexes are symmetric and normal bilaterally.   DIAGNOSTIC DATA (LABS, IMAGING, TESTING) - I reviewed patient records, labs, notes, testing and imaging myself where available.  Lab  Results  Component Value Date   WBC 7.8 05/23/2015   HGB 8.3* 05/23/2015   HCT 24.4* 05/23/2015   MCV 96.4 05/23/2015   PLT 343 05/23/2015      Component Value Date/Time   NA 136 06/04/2015 1515   NA 135* 05/23/2015   K 5.1 06/04/2015 1515   CL 105 06/04/2015 1515   CO2 21 06/04/2015 1515   GLUCOSE 83 06/04/2015 1515   BUN 34* 06/04/2015 1515   BUN 40* 05/23/2015   CREATININE 1.42* 06/04/2015 1515   CREATININE 2.02* 05/23/2015 0500   CREATININE 2.0* 05/23/2015   CALCIUM 9.5 06/04/2015 1515   PROT 7.9 05/14/2015 0356   ALBUMIN 4.3 05/14/2015 0356   AST 85* 05/14/2015 0356   ALT 74* 05/14/2015 0356   ALKPHOS 158* 05/14/2015 0356   BILITOT 1.0 05/14/2015 0356   GFRNONAA 23* 05/23/2015 0500   GFRAA 27* 05/23/2015 0500   Lab Results  Component Value Date   CHOL 175 03/06/2014   HDL 68 03/06/2014   LDLCALC 92 03/06/2014   TRIG 73 03/06/2014   CHOLHDL 2.6 03/06/2014   Lab Results  Component Value Date   HGBA1C 5.8* 03/06/2014    Lab Results  Component Value Date   TSH 1.765 05/16/2015      ASSESSMENT AND PLAN 75 y.o. year old female  has a past medical history of Hypertension; Stroke (HCC); Congestive heart failure (CHF) (HCC); Epilepsy (HCC); Smoker; and Seizures (HCC). here with:  1. Seizures 2. Memory disturbance-stable  I had a long discussion with the patient with regards to her seizures which appear quite well controlled. She seems to be tolerating the current dose of Keppra 500 mg at night and Vimpat 150 mg twice daily quite well without side effects. She was given refills for both medications. She was counseled to avoid seizure provoking stimuli. I advised her to discuss with the primary care physician her constipation issues.. She will return for follow-up in 6 months with nurse practitioner or call earlier if necessary.    Delia Heady, MD  06/24/2015, 11:18 AM [facility] [address] [place], [place] [address] [phone]

## 2015-06-24 NOTE — Patient Instructions (Signed)
I had a long discussion with the patient with regards to her seizures which appear quite well controlled. She seems to be tolerating the current dose of Keppra 500 mg at night and Vimpat 150 mg twice daily quite well without side effects. She was given refills for both medications. She was counseled to avoid seizure provoking stimuli. I advised her to discuss with the primary care physician her constipation issues.. She will return for follow-up in 6 months with nurse practitioner or call earlier if necessary.

## 2015-06-25 ENCOUNTER — Other Ambulatory Visit: Payer: Self-pay | Admitting: Adult Health

## 2015-06-27 ENCOUNTER — Other Ambulatory Visit: Payer: Self-pay | Admitting: Adult Health

## 2015-06-30 ENCOUNTER — Other Ambulatory Visit: Payer: Self-pay

## 2015-06-30 NOTE — Patient Outreach (Signed)
[facility]) Care Management  06/30/2015  [name] 05-19-1940 [id number]    EMMI F/U   Multiple attempts to establish contact with patient. No response from letter mailed to patient. Case is being closed at this time.    Plan: RN CM will notify [facility] administrative assistant of case closure.

## 2015-07-01 DIAGNOSIS — E43 Unspecified severe protein-calorie malnutrition: Secondary | ICD-10-CM | POA: Diagnosis not present

## 2015-07-01 DIAGNOSIS — M109 Gout, unspecified: Secondary | ICD-10-CM | POA: Diagnosis not present

## 2015-07-01 DIAGNOSIS — G40909 Epilepsy, unspecified, not intractable, without status epilepticus: Secondary | ICD-10-CM | POA: Diagnosis not present

## 2015-07-01 DIAGNOSIS — I130 Hypertensive heart and chronic kidney disease with heart failure and stage 1 through stage 4 chronic kidney disease, or unspecified chronic kidney disease: Secondary | ICD-10-CM | POA: Diagnosis not present

## 2015-07-01 DIAGNOSIS — J449 Chronic obstructive pulmonary disease, unspecified: Secondary | ICD-10-CM | POA: Diagnosis not present

## 2015-07-01 DIAGNOSIS — I255 Ischemic cardiomyopathy: Secondary | ICD-10-CM | POA: Diagnosis not present

## 2015-07-01 DIAGNOSIS — N183 Chronic kidney disease, stage 3 (moderate): Secondary | ICD-10-CM | POA: Diagnosis not present

## 2015-07-01 DIAGNOSIS — D649 Anemia, unspecified: Secondary | ICD-10-CM | POA: Diagnosis not present

## 2015-07-01 DIAGNOSIS — I5042 Chronic combined systolic (congestive) and diastolic (congestive) heart failure: Secondary | ICD-10-CM | POA: Diagnosis not present

## 2015-07-07 DIAGNOSIS — E43 Unspecified severe protein-calorie malnutrition: Secondary | ICD-10-CM | POA: Diagnosis not present

## 2015-07-07 DIAGNOSIS — M109 Gout, unspecified: Secondary | ICD-10-CM | POA: Diagnosis not present

## 2015-07-07 DIAGNOSIS — J449 Chronic obstructive pulmonary disease, unspecified: Secondary | ICD-10-CM | POA: Diagnosis not present

## 2015-07-07 DIAGNOSIS — I255 Ischemic cardiomyopathy: Secondary | ICD-10-CM | POA: Diagnosis not present

## 2015-07-07 DIAGNOSIS — G40909 Epilepsy, unspecified, not intractable, without status epilepticus: Secondary | ICD-10-CM | POA: Diagnosis not present

## 2015-07-07 DIAGNOSIS — I130 Hypertensive heart and chronic kidney disease with heart failure and stage 1 through stage 4 chronic kidney disease, or unspecified chronic kidney disease: Secondary | ICD-10-CM | POA: Diagnosis not present

## 2015-07-07 DIAGNOSIS — N183 Chronic kidney disease, stage 3 (moderate): Secondary | ICD-10-CM | POA: Diagnosis not present

## 2015-07-07 DIAGNOSIS — I5042 Chronic combined systolic (congestive) and diastolic (congestive) heart failure: Secondary | ICD-10-CM | POA: Diagnosis not present

## 2015-07-07 DIAGNOSIS — D649 Anemia, unspecified: Secondary | ICD-10-CM | POA: Diagnosis not present

## 2015-07-20 ENCOUNTER — Other Ambulatory Visit: Payer: Self-pay | Admitting: Adult Health

## 2015-07-20 ENCOUNTER — Telehealth: Payer: Self-pay | Admitting: *Deleted

## 2015-07-20 NOTE — Telephone Encounter (Signed)
[name] called requesting refills on her medications. Was discharged in April from facility. Explained to her that once discharges from facility she was no longer under our care and that she would have to follow up with her PCP to obtain that refill, unless she wanted to establish with our practice. She stated she will contact her PCP.

## 2015-07-22 DIAGNOSIS — I129 Hypertensive chronic kidney disease with stage 1 through stage 4 chronic kidney disease, or unspecified chronic kidney disease: Secondary | ICD-10-CM | POA: Diagnosis not present

## 2015-07-22 DIAGNOSIS — F17200 Nicotine dependence, unspecified, uncomplicated: Secondary | ICD-10-CM | POA: Diagnosis not present

## 2015-07-22 DIAGNOSIS — N183 Chronic kidney disease, stage 3 (moderate): Secondary | ICD-10-CM | POA: Diagnosis not present

## 2015-07-22 DIAGNOSIS — M109 Gout, unspecified: Secondary | ICD-10-CM | POA: Diagnosis not present

## 2015-08-06 ENCOUNTER — Other Ambulatory Visit (HOSPITAL_COMMUNITY): Payer: Commercial Managed Care - HMO

## 2015-08-20 ENCOUNTER — Ambulatory Visit: Payer: Commercial Managed Care - HMO | Admitting: Interventional Cardiology

## 2015-10-01 ENCOUNTER — Other Ambulatory Visit: Payer: Self-pay | Admitting: Adult Health

## 2015-10-06 DIAGNOSIS — G40909 Epilepsy, unspecified, not intractable, without status epilepticus: Secondary | ICD-10-CM | POA: Diagnosis not present

## 2015-10-06 DIAGNOSIS — R5383 Other fatigue: Secondary | ICD-10-CM | POA: Diagnosis not present

## 2015-10-12 DIAGNOSIS — F17200 Nicotine dependence, unspecified, uncomplicated: Secondary | ICD-10-CM | POA: Diagnosis not present

## 2015-10-12 DIAGNOSIS — I509 Heart failure, unspecified: Secondary | ICD-10-CM | POA: Diagnosis not present

## 2015-10-12 DIAGNOSIS — I129 Hypertensive chronic kidney disease with stage 1 through stage 4 chronic kidney disease, or unspecified chronic kidney disease: Secondary | ICD-10-CM | POA: Diagnosis not present

## 2015-10-12 DIAGNOSIS — D631 Anemia in chronic kidney disease: Secondary | ICD-10-CM | POA: Diagnosis not present

## 2015-10-12 DIAGNOSIS — R748 Abnormal levels of other serum enzymes: Secondary | ICD-10-CM | POA: Diagnosis not present

## 2015-10-12 DIAGNOSIS — N184 Chronic kidney disease, stage 4 (severe): Secondary | ICD-10-CM | POA: Diagnosis not present

## 2015-10-12 DIAGNOSIS — G40909 Epilepsy, unspecified, not intractable, without status epilepticus: Secondary | ICD-10-CM | POA: Diagnosis not present

## 2015-10-12 DIAGNOSIS — N183 Chronic kidney disease, stage 3 (moderate): Secondary | ICD-10-CM | POA: Diagnosis not present

## 2015-10-12 DIAGNOSIS — N2581 Secondary hyperparathyroidism of renal origin: Secondary | ICD-10-CM | POA: Diagnosis not present

## 2015-10-12 DIAGNOSIS — M109 Gout, unspecified: Secondary | ICD-10-CM | POA: Diagnosis not present

## 2015-10-29 ENCOUNTER — Other Ambulatory Visit: Payer: Self-pay

## 2015-10-29 NOTE — Patient Outreach (Signed)
[facility]) Care Management  10/29/2015  Nashanti Flikkema [birth year] [id number]   REFERRAL DATE: 10/29/15 REFERRAL SOURCE: EMMI prevent self referral REFERRAL REASON: self referral  Telephone call to patient regarding EMMI prevent referral.  Unable to reach patient. HIPAA compliant voice message left with call back phone number.   PLAN:  RNCM will attempt 2nd telephone call to patient within  1 week.   George Ina RN,BSN,CCM [facility] Telephonic  [phone]

## 2015-11-02 ENCOUNTER — Ambulatory Visit: Payer: Self-pay

## 2015-11-06 ENCOUNTER — Other Ambulatory Visit: Payer: Self-pay

## 2015-11-06 NOTE — Patient Outreach (Signed)
[facility]) Care Management  11/06/2015  Lashawn Sokolow Sep 01, 1940 [id number]   REFERRAL DATE: 10/29/15 REFERRAL SOURCE: EMMI prevent self referral REFERRAL REASON: self referral  SUBJECTIVE:  Telephone call to patient regarding EMMI prevent referral.  HIPAA verified with patient. Discussed and offered [facility] care management services to patient. Patient states she thinks she is doing ok and states its ok to send information regarding program.  PLAN:  RNCM will refer patient to [facility] case manager assistant to close due to refusal of services.  RNCM will notify patients primary MD of refusal of services.  RNCM will send patient [facility] care management outreach letter / brochure.   George Ina RN,BSN,CCM [facility] Telephonic  [phone]

## 2015-11-10 ENCOUNTER — Ambulatory Visit: Payer: Self-pay

## 2015-11-23 DIAGNOSIS — H40013 Open angle with borderline findings, low risk, bilateral: Secondary | ICD-10-CM | POA: Diagnosis not present

## 2015-11-23 DIAGNOSIS — H26493 Other secondary cataract, bilateral: Secondary | ICD-10-CM | POA: Diagnosis not present

## 2015-11-23 DIAGNOSIS — H43813 Vitreous degeneration, bilateral: Secondary | ICD-10-CM | POA: Diagnosis not present

## 2015-11-23 DIAGNOSIS — Z961 Presence of intraocular lens: Secondary | ICD-10-CM | POA: Diagnosis not present

## 2015-12-01 ENCOUNTER — Other Ambulatory Visit: Payer: Self-pay | Admitting: Neurology

## 2015-12-03 ENCOUNTER — Other Ambulatory Visit: Payer: Self-pay

## 2015-12-03 ENCOUNTER — Other Ambulatory Visit: Payer: Self-pay | Admitting: Neurology

## 2015-12-03 MED ORDER — LACOSAMIDE 150 MG PO TABS: 1.0000 | ORAL_TABLET | Freq: Two times a day (BID) | ORAL | 2 refills | Status: DC

## 2015-12-03 NOTE — Telephone Encounter (Signed)
Refill done for vimpat. Dr. Pearlean Brownie has to sign prescription for patient so it can be fax today.

## 2015-12-03 NOTE — Telephone Encounter (Signed)
Patient requesting refill of VIMPAT 150 MG TABS Pharmacy: [id number] - [place], [place] - [address] AT [facility] OF [address] & [address]

## 2015-12-07 NOTE — Telephone Encounter (Signed)
Rx signed and faxed to requested pharmacy.

## 2015-12-17 ENCOUNTER — Other Ambulatory Visit: Payer: Self-pay | Admitting: Neurology

## 2015-12-29 ENCOUNTER — Ambulatory Visit: Payer: Commercial Managed Care - HMO | Admitting: Adult Health

## 2016-01-06 ENCOUNTER — Ambulatory Visit: Payer: Self-pay | Admitting: Adult Health

## 2016-01-14 ENCOUNTER — Encounter: Payer: Self-pay | Admitting: Adult Health

## 2016-01-14 ENCOUNTER — Ambulatory Visit (INDEPENDENT_AMBULATORY_CARE_PROVIDER_SITE_OTHER): Payer: Commercial Managed Care - HMO | Admitting: Adult Health

## 2016-01-14 VITALS — BP 112/62 | HR 86 | Resp 14 | Ht 61.0 in | Wt 85.0 lb

## 2016-01-14 DIAGNOSIS — R569 Unspecified convulsions: Secondary | ICD-10-CM

## 2016-01-14 NOTE — Progress Notes (Signed)
PATIENT: [name] DOB: 05-23-1940  REASON FOR VISIT: follow up HISTORY FROM: patient  HISTORY OF PRESENT ILLNESS: HISTORY HISTORY 08/18/14: [name] is a 75 year old female with a history of seizures. She returns today for follow-up. The patient is currently taken Vimpat 100 mg twice a day and tolerating it well. The patient decreased her Keppra dose to half a tablet at bedtime. She denies any seizure activity since the last visit. She continues to complete all ADLs independently. She does not operate a motor vehicle. She denies any changes with her gait or balance. Patient states that she sleeps well at night. The patient had to decrease her dose of Keppra because she was sleepy all the time. Family member reports that her sleepiness has improved since she decreased her Keppra dose. Patient feels that her memory has remained stable. Denies any changes with her memory. Her family member reports that "she does not forget anything." She denies any new medical issues. She returns today for an evaluation.  HISTORY 04/01/14: [name] is a 75 year old female with a history of seizures. She returns today for follow-up. The patient is currently taken Vimpat 100 mg twice a day. And Keppra 750 mg twice a day. She reports that her seizures was controlled until the beginning of February. Patient states that she fell out of bed at the beginning of February and was taken to the hospital. While she was having x-rays completed she had a seizure event. She was later discharged from the hospital but had a seizure on the way out. She was then admitted for several days. Her Keppra was increased to 750 mg twice a day. Her son also reports that she was diagnosed with a urinary tract infection while in the hospital. Since being discharged from the hospital she has not had any additional seizure events. does not operate a motor vehicle. Denies any changes with her gait or balance. She states that she has a therapist  that works with her. She uses a cane for ambulation. No new medical issues since last seen. Update 06/24/2015 ; she returns for follow-up after last visit 6 months ago. She continues to do well without recurrent seizures now for several years. She is tolerating Keppra 500 mg at night and Vimpat 150 mg twice daily without any significant dizziness, nausea, sleepiness, ataxia diplopia. She is suffering from constipation but has not discussed this with her primary care physician yet. She has received a jury summons and is asking me that I can help her get out of it. I explained to her that I do not believe I can medically support her not to do jury duty at the present time. She continues to have mild short-term memory difficulties but then nonprogressive and not bothersome.  Today 01/14/2016: [name] is a 75 year old female with a history of seizures. She returns today for follow-up. She is currently taking Keppra 500 mg at night as well as Vimpat 150 mg twice daily. She reports that she is tolerating these medications well. She denies any seizure events. She does have some constipation however she is trying to adjust her diet. Occasionally she reports she has taken a laxative. At home she is able to complete all ADLs independently. She does operate a motor vehicle occasionally but normally has someone drive her places. She returns today for an evaluation.   REVIEW OF SYSTEMS: Out of a complete 14 system review of symptoms, the patient complains only of the following symptoms, and all other reviewed  systems are negative.  Constipation, headache  ALLERGIES: No Known Allergies  HOME MEDICATIONS: Outpatient Medications Prior to Visit  Medication Sig Dispense Refill  . allopurinol (ZYLOPRIM) 100 MG tablet Take 200 mg by mouth daily.   5  . aspirin 325 MG tablet Take 325 mg by mouth every morning. Reported on 06/04/2015    . budesonide-formoterol (SYMBICORT) 80-4.5 MCG/ACT inhaler Inhale 2 puffs into the  lungs 2 (two) times daily. 1 Inhaler 3  . calcitRIOL (ROCALTROL) 0.25 MCG capsule Take 0.25 mcg by mouth daily.    . carvedilol (COREG) 6.25 MG tablet Take 1 tablet (6.25 mg total) by mouth 2 (two) times daily with a meal. 60 tablet 3  . colchicine (COLCRYS) 0.6 MG tablet Take 0.6 mg by mouth as directed.     . furosemide (LASIX) 20 MG tablet Take 1 tablet (20 mg total) by mouth daily. 30 tablet 1  . isosorbide-hydrALAZINE (BIDIL) 20-37.5 MG tablet Take 1 tablet by mouth 3 (three) times daily. 90 tablet 3  . Lacosamide (VIMPAT) 150 MG TABS Take 1 tablet (150 mg total) by mouth 2 (two) times daily. 180 tablet 2  . levETIRAcetam (KEPPRA) 500 MG tablet TAKE 1 TABLET BY MOUTH AT BEDTIME 90 tablet 0  . tiotropium (SPIRIVA HANDIHALER) 18 MCG inhalation capsule Place 1 capsule (18 mcg total) into inhaler and inhale daily. 30 capsule 3   No facility-administered medications prior to visit.     PAST MEDICAL HISTORY: Past Medical History:  Diagnosis Date  . Congestive heart failure (CHF) (HCC)   . Epilepsy (HCC)   . Hypertension   . Seizures (HCC)   . Smoker   . Stroke [facility](HCC)     PAST SURGICAL HISTORY: Past Surgical History:  Procedure Laterality Date  . NO PAST SURGERIES      FAMILY HISTORY: Family History  Problem Relation Age of Onset  . Cancer Father   . Anemia Daughter     SOCIAL HISTORY: Social History   Social History  . Marital status: Single    Spouse name: N/A  . Number of children: 3  . Years of education: 1613   Occupational History  .      Retired   Social History Main Topics  . Smoking status: Current Some Day Smoker    Packs/day: 0.25    Types: Cigarettes  . Smokeless tobacco: Never Used     Comment: 1/2 caigarettes a day  . Alcohol use 0.0 oz/week     Comment: occasional  . Drug use: No  . Sexual activity: No   Other Topics Concern  . Not on file   Social History Narrative   Patient lives at home alone.    Retired.   Education one year of college.    Left handed.   Caffeine one cup of coffee daily and one cup of tea.      PHYSICAL EXAM  Vitals:   01/14/16 1504  BP: 112/62  Pulse: 86  Resp: 14  Weight: 85 lb (38.6 kg)  Height: 5\' 1"  (1.549 m)   Body mass index is 16.06 kg/m.  Generalized: Well developed, in no acute distress   Neurological examination  Mentation: Alert oriented to time, place, history taking. Follows all commands speech and language fluent Cranial nerve II-XII: Pupils were equal round reactive to light. Extraocular movements were full, visual field were full on confrontational test. Facial sensation and strength were normal. Uvula tongue midline. Head turning and shoulder shrug  were normal and symmetric. Motor:  The motor testing reveals 5 over 5 strength of all 4 extremities. Good symmetric motor tone is noted throughout.  Sensory: Sensory testing is intact to soft touch on all 4 extremities. No evidence of extinction is noted.  Coordination: Cerebellar testing reveals good finger-nose-finger and heel-to-shin bilaterally.  Gait and station: Gait is normal. Reflexes: Deep tendon reflexes are symmetric and normal bilaterally.   DIAGNOSTIC DATA (LABS, IMAGING, TESTING) - I reviewed patient records, labs, notes, testing and imaging myself where available.  Lab Results  Component Value Date   WBC 7.8 05/23/2015   HGB 8.3 (L) 05/23/2015   HCT 24.4 (L) 05/23/2015   MCV 96.4 05/23/2015   PLT 343 05/23/2015      Component Value Date/Time   NA 136 06/04/2015 1515   NA 135 (A) 05/23/2015   K 5.1 06/04/2015 1515   CL 105 06/04/2015 1515   CO2 21 06/04/2015 1515   GLUCOSE 83 06/04/2015 1515   BUN 34 (H) 06/04/2015 1515   BUN 40 (A) 05/23/2015   CREATININE 1.42 (H) 06/04/2015 1515   CALCIUM 9.5 06/04/2015 1515   PROT 7.9 05/14/2015 0356   ALBUMIN 4.3 05/14/2015 0356   AST 85 (H) 05/14/2015 0356   ALT 74 (H) 05/14/2015 0356   ALKPHOS 158 (H) 05/14/2015 0356   BILITOT 1.0 05/14/2015 0356   GFRNONAA  23 (L) 05/23/2015 0500   GFRAA 27 (L) 05/23/2015 0500   Lab Results  Component Value Date   CHOL 175 03/06/2014   HDL 68 03/06/2014   LDLCALC 92 03/06/2014   TRIG 73 03/06/2014   CHOLHDL 2.6 03/06/2014   Lab Results  Component Value Date   HGBA1C 5.8 (H) 03/06/2014   Lab Results  Component Value Date   VITAMINB12 516 05/16/2015   Lab Results  Component Value Date   TSH 1.765 05/16/2015      ASSESSMENT AND PLAN 75 y.o. year old female  has a past medical history of Congestive heart failure (CHF) (HCC); Epilepsy (HCC); Hypertension; Seizures (HCC); Smoker; and Stroke [facility]). here with:  1. Seizures  Overall the patient is doing well. She will continue on Keppra and Vimpat. Advised that if she has any seizure events she should let us know. She will follow-up in 6 months or sooner if needed.     Butch Penny, MSN, NP-C 01/14/2016, 2:58 PM [facility] [address], [address] [place], [place] [address] [phone]

## 2016-01-14 NOTE — Patient Instructions (Signed)
Continue keppra and Vimapt If you have any seizure events please let us know.

## 2016-01-15 NOTE — Progress Notes (Signed)
Personally  participated in and made any corrections needed to history, physical, neuro exam,assessment and plan as stated above. I agree with findings and plan.  Antonia Ahern, MD [facility] 

## 2016-01-21 DIAGNOSIS — D631 Anemia in chronic kidney disease: Secondary | ICD-10-CM | POA: Diagnosis not present

## 2016-01-21 DIAGNOSIS — R748 Abnormal levels of other serum enzymes: Secondary | ICD-10-CM | POA: Diagnosis not present

## 2016-01-21 DIAGNOSIS — N2581 Secondary hyperparathyroidism of renal origin: Secondary | ICD-10-CM | POA: Diagnosis not present

## 2016-01-21 DIAGNOSIS — I509 Heart failure, unspecified: Secondary | ICD-10-CM | POA: Diagnosis not present

## 2016-01-21 DIAGNOSIS — N184 Chronic kidney disease, stage 4 (severe): Secondary | ICD-10-CM | POA: Diagnosis not present

## 2016-01-21 DIAGNOSIS — N390 Urinary tract infection, site not specified: Secondary | ICD-10-CM | POA: Diagnosis not present

## 2016-01-21 DIAGNOSIS — M109 Gout, unspecified: Secondary | ICD-10-CM | POA: Diagnosis not present

## 2016-01-21 DIAGNOSIS — F17200 Nicotine dependence, unspecified, uncomplicated: Secondary | ICD-10-CM | POA: Diagnosis not present

## 2016-01-21 DIAGNOSIS — I129 Hypertensive chronic kidney disease with stage 1 through stage 4 chronic kidney disease, or unspecified chronic kidney disease: Secondary | ICD-10-CM | POA: Diagnosis not present

## 2016-01-21 DIAGNOSIS — N183 Chronic kidney disease, stage 3 (moderate): Secondary | ICD-10-CM | POA: Diagnosis not present

## 2016-01-21 DIAGNOSIS — G40909 Epilepsy, unspecified, not intractable, without status epilepticus: Secondary | ICD-10-CM | POA: Diagnosis not present

## 2016-03-02 ENCOUNTER — Other Ambulatory Visit: Payer: Self-pay | Admitting: Neurology

## 2016-03-03 ENCOUNTER — Other Ambulatory Visit: Payer: Self-pay | Admitting: *Deleted

## 2016-03-03 ENCOUNTER — Other Ambulatory Visit: Payer: Self-pay | Admitting: Neurology

## 2016-03-03 NOTE — Telephone Encounter (Signed)
Rx printed, signed, faxed and confirmed to pharmacy. 

## 2016-03-03 NOTE — Telephone Encounter (Signed)
Patient called requesting a refill for VIMPAT 100 MG TABS.

## 2016-03-17 ENCOUNTER — Other Ambulatory Visit: Payer: Self-pay | Admitting: Neurology

## 2016-03-23 DIAGNOSIS — Z13820 Encounter for screening for osteoporosis: Secondary | ICD-10-CM | POA: Diagnosis not present

## 2016-03-23 DIAGNOSIS — Z1231 Encounter for screening mammogram for malignant neoplasm of breast: Secondary | ICD-10-CM | POA: Diagnosis not present

## 2016-03-23 DIAGNOSIS — Z136 Encounter for screening for cardiovascular disorders: Secondary | ICD-10-CM | POA: Diagnosis not present

## 2016-03-23 DIAGNOSIS — Z1389 Encounter for screening for other disorder: Secondary | ICD-10-CM | POA: Diagnosis not present

## 2016-03-23 DIAGNOSIS — Z0000 Encounter for general adult medical examination without abnormal findings: Secondary | ICD-10-CM | POA: Diagnosis not present

## 2016-03-23 DIAGNOSIS — Z23 Encounter for immunization: Secondary | ICD-10-CM | POA: Diagnosis not present

## 2016-04-18 DIAGNOSIS — N2581 Secondary hyperparathyroidism of renal origin: Secondary | ICD-10-CM | POA: Diagnosis not present

## 2016-04-18 DIAGNOSIS — F17200 Nicotine dependence, unspecified, uncomplicated: Secondary | ICD-10-CM | POA: Diagnosis not present

## 2016-04-18 DIAGNOSIS — R748 Abnormal levels of other serum enzymes: Secondary | ICD-10-CM | POA: Diagnosis not present

## 2016-04-18 DIAGNOSIS — M109 Gout, unspecified: Secondary | ICD-10-CM | POA: Diagnosis not present

## 2016-04-18 DIAGNOSIS — I509 Heart failure, unspecified: Secondary | ICD-10-CM | POA: Diagnosis not present

## 2016-04-18 DIAGNOSIS — I639 Cerebral infarction, unspecified: Secondary | ICD-10-CM | POA: Diagnosis not present

## 2016-04-18 DIAGNOSIS — N183 Chronic kidney disease, stage 3 (moderate): Secondary | ICD-10-CM | POA: Diagnosis not present

## 2016-04-18 DIAGNOSIS — D631 Anemia in chronic kidney disease: Secondary | ICD-10-CM | POA: Diagnosis not present

## 2016-04-18 DIAGNOSIS — I129 Hypertensive chronic kidney disease with stage 1 through stage 4 chronic kidney disease, or unspecified chronic kidney disease: Secondary | ICD-10-CM | POA: Diagnosis not present

## 2016-04-18 DIAGNOSIS — N184 Chronic kidney disease, stage 4 (severe): Secondary | ICD-10-CM | POA: Diagnosis not present

## 2016-05-20 ENCOUNTER — Other Ambulatory Visit: Payer: Self-pay | Admitting: Adult Health

## 2016-05-23 NOTE — Telephone Encounter (Signed)
Called patient to schedule follow up; advised her prescriptions were refilled by Dr Pearlean Brownie in Feb  However she was to call and schedule FU which she did not do. Left number. Attempted to reach daughter, [name]. Home phone rang continuously.  Called mobile and LVM advising daughter that patient needs FU in order to continue to receive seizure medications. Requested she or someone call office and schedule follow up.

## 2016-07-12 ENCOUNTER — Ambulatory Visit (INDEPENDENT_AMBULATORY_CARE_PROVIDER_SITE_OTHER): Payer: Medicare HMO | Admitting: Adult Health

## 2016-07-12 ENCOUNTER — Encounter: Payer: Self-pay | Admitting: Adult Health

## 2016-07-12 VITALS — BP 143/98 | HR 73 | Resp 14 | Ht 61.0 in | Wt 80.4 lb

## 2016-07-12 DIAGNOSIS — R569 Unspecified convulsions: Secondary | ICD-10-CM

## 2016-07-12 MED ORDER — LACOSAMIDE 100 MG PO TABS: 1.0000 | ORAL_TABLET | Freq: Two times a day (BID) | ORAL | 5 refills | Status: DC

## 2016-07-12 MED ORDER — LEVETIRACETAM 500 MG PO TABS: 500.0000 mg | ORAL_TABLET | Freq: Every day | ORAL | 3 refills | Status: DC

## 2016-07-12 NOTE — Progress Notes (Signed)
I have read the note, and I agree with the clinical assessment and plan.  [name],[name]   

## 2016-07-12 NOTE — Progress Notes (Signed)
PATIENT: [name] DOB: 1941/01/19  REASON FOR VISIT: follow up- seizures HISTORY FROM: patient  HISTORY OF PRESENT ILLNESS: Today : 07/12/16: [name] is a 76 year old female with a history of seizures. She returns today for follow-up. She remains on Keppra taking 500 mg at bedtime and Vimpat 100 mg twice a day. She denies any seizure events. Denies any changes with her gait or balance. No change in mood or behavior. She is able to operate a motor vehicle. She completes all ADLs independently. She denies any additional neurological symptoms. She returns today for an evaluation.   History  01/14/2016 (MM): [name] is a 76 year old female with a history of seizures. She returns today for follow-up. She is currently taking Keppra 500 mg at night as well as Vimpat 150 mg twice daily. She reports that she is tolerating these medications well. She denies any seizure events. She does have some constipation however she is trying to adjust her diet. Occasionally she reports she has taken a laxative. At home she is able to complete all ADLs independently. She does operate a motor vehicle occasionally but normally has someone drive her places. She returns today for an evaluation.    REVIEW OF SYSTEMS: Out of a complete 14 system review of symptoms, the patient complains only of the following symptoms, and all other reviewed systems are negative.  See HPI  ALLERGIES: No Known Allergies  HOME MEDICATIONS: Outpatient Medications Prior to Visit  Medication Sig Dispense Refill  . allopurinol (ZYLOPRIM) 100 MG tablet Take 200 mg by mouth daily.   5  . aspirin 325 MG tablet Take 325 mg by mouth every morning. Reported on 06/04/2015    . carvedilol (COREG) 6.25 MG tablet Take 1 tablet (6.25 mg total) by mouth 2 (two) times daily with a meal. 60 tablet 3  . levETIRAcetam (KEPPRA) 500 MG tablet TAKE 1 TABLET BY MOUTH AT BEDTIME 90 tablet 0  . VIMPAT 100 MG TABS TAKE 1 AND 1/2 TABLETS BY  MOUTH TWICE DAILY 90 tablet 5  . calcitRIOL (ROCALTROL) 0.25 MCG capsule Take 0.25 mcg by mouth daily.    . furosemide (LASIX) 20 MG tablet Take 1 tablet (20 mg total) by mouth daily. (Patient not taking: Reported on 07/12/2016) 30 tablet 1   No facility-administered medications prior to visit.     PAST MEDICAL HISTORY: Past Medical History:  Diagnosis Date  . Congestive heart failure (CHF) (HCC)   . Epilepsy (HCC)   . Hypertension   . Seizures (HCC)   . Smoker   . Stroke [facility])     PAST SURGICAL HISTORY: Past Surgical History:  Procedure Laterality Date  . NO PAST SURGERIES      FAMILY HISTORY: Family History  Problem Relation Age of Onset  . Cancer Father   . Anemia Daughter     SOCIAL HISTORY: Social History   Social History  . Marital status: Single    Spouse name: N/A  . Number of children: 3  . Years of education: 25   Occupational History  .      Retired   Social History Main Topics  . Smoking status: Current Some Day Smoker    Packs/day: 0.25    Types: Cigarettes  . Smokeless tobacco: Never Used     Comment: 1/2 caigarettes a day  . Alcohol use 0.0 oz/week     Comment: occasional  . Drug use: No  . Sexual activity: No   Other Topics Concern  .  Not on file   Social History Narrative   Patient lives at home alone.    Retired.   Education one year of college.   Left handed.   Caffeine one cup of coffee daily and one cup of tea.      PHYSICAL EXAM  Vitals:   07/12/16 0854  BP: (!) 143/98  Pulse: 73  Resp: 14  Weight: 80 lb 6.4 oz (36.5 kg)  Height: 5\' 1"  (1.549 m)   Body mass index is 15.19 kg/m.  Generalized: Well developed, in no acute distress   Neurological examination  Mentation: Alert oriented to time, place, history taking. Follows all commands speech and language fluent Cranial nerve II-XII: Pupils were equal round reactive to light. Extraocular movements were full, visual field were full on confrontational test. Facial  sensation and strength were normal. Uvula tongue midline. Head turning and shoulder shrug  were normal and symmetric. Motor: The motor testing reveals 5 over 5 strength of all 4 extremities. Good symmetric motor tone is noted throughout.  Sensory: Sensory testing is intact to soft touch on all 4 extremities. No evidence of extinction is noted.  Coordination: Cerebellar testing reveals good finger-nose-finger and heel-to-shin bilaterally.  Gait and station: Gait is normal..  Reflexes: Deep tendon reflexes are symmetric and normal bilaterally.   DIAGNOSTIC DATA (LABS, IMAGING, TESTING) - I reviewed patient records, labs, notes, testing and imaging myself where available.  Lab Results  Component Value Date   WBC 7.8 05/23/2015   HGB 8.3 (L) 05/23/2015   HCT 24.4 (L) 05/23/2015   MCV 96.4 05/23/2015   PLT 343 05/23/2015      Component Value Date/Time   NA 136 06/04/2015 1515   NA 135 (A) 05/23/2015   K 5.1 06/04/2015 1515   CL 105 06/04/2015 1515   CO2 21 06/04/2015 1515   GLUCOSE 83 06/04/2015 1515   BUN 34 (H) 06/04/2015 1515   BUN 40 (A) 05/23/2015   CREATININE 1.42 (H) 06/04/2015 1515   CALCIUM 9.5 06/04/2015 1515   PROT 7.9 05/14/2015 0356   ALBUMIN 4.3 05/14/2015 0356   AST 85 (H) 05/14/2015 0356   ALT 74 (H) 05/14/2015 0356   ALKPHOS 158 (H) 05/14/2015 0356   BILITOT 1.0 05/14/2015 0356   GFRNONAA 23 (L) 05/23/2015 0500   GFRAA 27 (L) 05/23/2015 0500   Lab Results  Component Value Date   CHOL 175 03/06/2014   HDL 68 03/06/2014   LDLCALC 92 03/06/2014   TRIG 73 03/06/2014   CHOLHDL 2.6 03/06/2014   Lab Results  Component Value Date   HGBA1C 5.8 (H) 03/06/2014   Lab Results  Component Value Date   VITAMINB12 516 05/16/2015   Lab Results  Component Value Date   TSH 1.765 05/16/2015      ASSESSMENT AND PLAN 76 y.o. year old female  has a past medical history of Congestive heart failure (CHF) (HCC); Epilepsy (HCC); Hypertension; Seizures (HCC); Smoker; and  Stroke [facility]). here with:  1. Seizures  Overall she is doing well. She will continue on Keppra 500 mg at bedtime and Vimpat 100 mg twice a day. She is advised that if she has any seizure event she should let us know. I reviewed seizure precautions with the patient. She will follow-up in one year with Dr. Pearlean Brownie or sooner if needed.   I spent 15 minutes with the patient. 50% of this time was spent reviewing her medication and seizure precautions.     Butch Penny, MSN, NP-C 07/12/2016,  9:02 AM [facility] [address], [address] [place], [place] [address] [phone]

## 2016-07-19 DIAGNOSIS — I639 Cerebral infarction, unspecified: Secondary | ICD-10-CM | POA: Diagnosis not present

## 2016-07-19 DIAGNOSIS — N2581 Secondary hyperparathyroidism of renal origin: Secondary | ICD-10-CM | POA: Diagnosis not present

## 2016-07-19 DIAGNOSIS — D631 Anemia in chronic kidney disease: Secondary | ICD-10-CM | POA: Diagnosis not present

## 2016-07-19 DIAGNOSIS — M109 Gout, unspecified: Secondary | ICD-10-CM | POA: Diagnosis not present

## 2016-07-19 DIAGNOSIS — G40909 Epilepsy, unspecified, not intractable, without status epilepticus: Secondary | ICD-10-CM | POA: Diagnosis not present

## 2016-07-19 DIAGNOSIS — R748 Abnormal levels of other serum enzymes: Secondary | ICD-10-CM | POA: Diagnosis not present

## 2016-07-19 DIAGNOSIS — I509 Heart failure, unspecified: Secondary | ICD-10-CM | POA: Diagnosis not present

## 2016-07-19 DIAGNOSIS — Z0000 Encounter for general adult medical examination without abnormal findings: Secondary | ICD-10-CM | POA: Diagnosis not present

## 2016-07-19 DIAGNOSIS — N183 Chronic kidney disease, stage 3 (moderate): Secondary | ICD-10-CM | POA: Diagnosis not present

## 2016-07-19 DIAGNOSIS — I129 Hypertensive chronic kidney disease with stage 1 through stage 4 chronic kidney disease, or unspecified chronic kidney disease: Secondary | ICD-10-CM | POA: Diagnosis not present

## 2016-09-27 DIAGNOSIS — F17200 Nicotine dependence, unspecified, uncomplicated: Secondary | ICD-10-CM | POA: Diagnosis not present

## 2016-09-27 DIAGNOSIS — N390 Urinary tract infection, site not specified: Secondary | ICD-10-CM | POA: Diagnosis not present

## 2016-09-27 DIAGNOSIS — Z1211 Encounter for screening for malignant neoplasm of colon: Secondary | ICD-10-CM | POA: Diagnosis not present

## 2016-09-27 DIAGNOSIS — M810 Age-related osteoporosis without current pathological fracture: Secondary | ICD-10-CM | POA: Diagnosis not present

## 2016-09-27 DIAGNOSIS — N183 Chronic kidney disease, stage 3 (moderate): Secondary | ICD-10-CM | POA: Diagnosis not present

## 2016-09-27 DIAGNOSIS — E46 Unspecified protein-calorie malnutrition: Secondary | ICD-10-CM | POA: Diagnosis not present

## 2016-09-27 DIAGNOSIS — I129 Hypertensive chronic kidney disease with stage 1 through stage 4 chronic kidney disease, or unspecified chronic kidney disease: Secondary | ICD-10-CM | POA: Diagnosis not present

## 2016-09-27 DIAGNOSIS — G40909 Epilepsy, unspecified, not intractable, without status epilepticus: Secondary | ICD-10-CM | POA: Diagnosis not present

## 2016-09-27 DIAGNOSIS — M109 Gout, unspecified: Secondary | ICD-10-CM | POA: Diagnosis not present

## 2016-09-30 DIAGNOSIS — Z1211 Encounter for screening for malignant neoplasm of colon: Secondary | ICD-10-CM | POA: Diagnosis not present

## 2016-11-07 DIAGNOSIS — M810 Age-related osteoporosis without current pathological fracture: Secondary | ICD-10-CM | POA: Diagnosis not present

## 2016-11-07 DIAGNOSIS — E2839 Other primary ovarian failure: Secondary | ICD-10-CM | POA: Diagnosis not present

## 2016-11-25 DIAGNOSIS — Z1211 Encounter for screening for malignant neoplasm of colon: Secondary | ICD-10-CM | POA: Diagnosis not present

## 2016-12-12 DIAGNOSIS — N183 Chronic kidney disease, stage 3 (moderate): Secondary | ICD-10-CM | POA: Diagnosis not present

## 2016-12-12 DIAGNOSIS — Z23 Encounter for immunization: Secondary | ICD-10-CM | POA: Diagnosis not present

## 2016-12-12 DIAGNOSIS — I509 Heart failure, unspecified: Secondary | ICD-10-CM | POA: Diagnosis not present

## 2016-12-12 DIAGNOSIS — I129 Hypertensive chronic kidney disease with stage 1 through stage 4 chronic kidney disease, or unspecified chronic kidney disease: Secondary | ICD-10-CM | POA: Diagnosis not present

## 2016-12-12 DIAGNOSIS — M109 Gout, unspecified: Secondary | ICD-10-CM | POA: Diagnosis not present

## 2016-12-12 DIAGNOSIS — N2581 Secondary hyperparathyroidism of renal origin: Secondary | ICD-10-CM | POA: Diagnosis not present

## 2016-12-12 DIAGNOSIS — D631 Anemia in chronic kidney disease: Secondary | ICD-10-CM | POA: Diagnosis not present

## 2016-12-12 DIAGNOSIS — I639 Cerebral infarction, unspecified: Secondary | ICD-10-CM | POA: Diagnosis not present

## 2016-12-12 DIAGNOSIS — G40909 Epilepsy, unspecified, not intractable, without status epilepticus: Secondary | ICD-10-CM | POA: Diagnosis not present

## 2016-12-16 DIAGNOSIS — Z1211 Encounter for screening for malignant neoplasm of colon: Secondary | ICD-10-CM | POA: Diagnosis not present

## 2017-01-16 DIAGNOSIS — H2513 Age-related nuclear cataract, bilateral: Secondary | ICD-10-CM | POA: Diagnosis not present

## 2017-01-18 DIAGNOSIS — N183 Chronic kidney disease, stage 3 (moderate): Secondary | ICD-10-CM | POA: Diagnosis not present

## 2017-02-16 DIAGNOSIS — H2511 Age-related nuclear cataract, right eye: Secondary | ICD-10-CM | POA: Diagnosis not present

## 2017-02-22 DIAGNOSIS — R51 Headache: Secondary | ICD-10-CM | POA: Diagnosis not present

## 2017-02-22 DIAGNOSIS — I129 Hypertensive chronic kidney disease with stage 1 through stage 4 chronic kidney disease, or unspecified chronic kidney disease: Secondary | ICD-10-CM | POA: Diagnosis not present

## 2017-02-28 ENCOUNTER — Telehealth: Payer: Self-pay | Admitting: *Deleted

## 2017-02-28 NOTE — Telephone Encounter (Signed)
Vimpat refill successfully faxed to [facility], [address].

## 2017-03-02 ENCOUNTER — Telehealth: Payer: Self-pay | Admitting: *Deleted

## 2017-03-02 NOTE — Telephone Encounter (Signed)
To Darreld Mclean for pt assistance.

## 2017-03-02 NOTE — Telephone Encounter (Signed)
Need Printed RX for Vimpat.  Free two trial card I will give to patient .  I spoke to Patient and she need's to bring me proof of income . Process 7-10 day turn around time.

## 2017-03-02 NOTE — Telephone Encounter (Signed)
Went to pick up her Vimpat at the pharmacy and they told her she would have to pay $400.  She cant pay that.  What can she do?  She has been taking it for years and has never had to do that.  Please call. She is concerned she will run out of medicine.

## 2017-03-03 MED ORDER — LACOSAMIDE 100 MG PO TABS: 1.0000 | ORAL_TABLET | Freq: Two times a day (BID) | ORAL | 3 refills | Status: DC

## 2017-03-03 MED ORDER — LACOSAMIDE 100 MG PO TABS: 1.0000 | ORAL_TABLET | Freq: Two times a day (BID) | ORAL | 0 refills | Status: DC

## 2017-03-03 NOTE — Addendum Note (Signed)
Addended by: Guy Begin on: 03/03/2017 09:17 AM   Modules accepted: Orders

## 2017-03-03 NOTE — Telephone Encounter (Addendum)
Prescriptions done, placed on Dr. Marlis Edelson desk for signature.  Pt to come in today by 1130 and pick up.

## 2017-03-07 ENCOUNTER — Telehealth: Payer: Self-pay | Admitting: Neurology

## 2017-03-07 NOTE — Telephone Encounter (Signed)
Application is processing for patient assistance VIMPAT [employer]  Telephone [phone] - fax [phone].

## 2017-03-13 NOTE — Telephone Encounter (Signed)
Patient was approved for VIMPAT Feb 8 th 2019 - Feb. 8 th 2021 . Patient's RX will be Delivered to her Home.

## 2017-04-24 DIAGNOSIS — I639 Cerebral infarction, unspecified: Secondary | ICD-10-CM | POA: Diagnosis not present

## 2017-04-24 DIAGNOSIS — I129 Hypertensive chronic kidney disease with stage 1 through stage 4 chronic kidney disease, or unspecified chronic kidney disease: Secondary | ICD-10-CM | POA: Diagnosis not present

## 2017-04-24 DIAGNOSIS — N183 Chronic kidney disease, stage 3 (moderate): Secondary | ICD-10-CM | POA: Diagnosis not present

## 2017-04-24 DIAGNOSIS — I509 Heart failure, unspecified: Secondary | ICD-10-CM | POA: Diagnosis not present

## 2017-04-24 DIAGNOSIS — D631 Anemia in chronic kidney disease: Secondary | ICD-10-CM | POA: Diagnosis not present

## 2017-04-24 DIAGNOSIS — Z23 Encounter for immunization: Secondary | ICD-10-CM | POA: Diagnosis not present

## 2017-04-24 DIAGNOSIS — G40909 Epilepsy, unspecified, not intractable, without status epilepticus: Secondary | ICD-10-CM | POA: Diagnosis not present

## 2017-04-24 DIAGNOSIS — M109 Gout, unspecified: Secondary | ICD-10-CM | POA: Diagnosis not present

## 2017-04-24 DIAGNOSIS — N2581 Secondary hyperparathyroidism of renal origin: Secondary | ICD-10-CM | POA: Diagnosis not present

## 2017-05-15 DIAGNOSIS — H35351 Cystoid macular degeneration, right eye: Secondary | ICD-10-CM | POA: Diagnosis not present

## 2017-05-15 DIAGNOSIS — H2640 Unspecified secondary cataract: Secondary | ICD-10-CM | POA: Diagnosis not present

## 2017-07-06 DIAGNOSIS — I129 Hypertensive chronic kidney disease with stage 1 through stage 4 chronic kidney disease, or unspecified chronic kidney disease: Secondary | ICD-10-CM | POA: Diagnosis not present

## 2017-07-06 DIAGNOSIS — R51 Headache: Secondary | ICD-10-CM | POA: Diagnosis not present

## 2017-07-06 DIAGNOSIS — E440 Moderate protein-calorie malnutrition: Secondary | ICD-10-CM | POA: Diagnosis not present

## 2017-07-06 DIAGNOSIS — R5383 Other fatigue: Secondary | ICD-10-CM | POA: Diagnosis not present

## 2017-07-06 DIAGNOSIS — R42 Dizziness and giddiness: Secondary | ICD-10-CM | POA: Diagnosis not present

## 2017-07-10 DIAGNOSIS — N183 Chronic kidney disease, stage 3 (moderate): Secondary | ICD-10-CM | POA: Diagnosis not present

## 2017-07-17 ENCOUNTER — Ambulatory Visit: Payer: Medicare HMO | Admitting: Neurology

## 2017-07-31 NOTE — Telephone Encounter (Addendum)
Patient came to front desk and relayed she didn't have any more Vimpat she did not call me or [employer] Patient assistance for a refill . Vimpat Approved [employer] Feb. 8 th 2019 - Feb 8 th 2021 [phone]  RX goes to pateint's home.    I called [employer] and Patient's Vimpat will be delivered 08/01/2017. On a [employer].   I advised Patient to go to her local pharmacy and Buy her dosage until.  08/02/2017 to be on the safe side until  RX came . Patient is on her way to her pharmacy and she understood all details.

## 2017-08-01 ENCOUNTER — Ambulatory Visit: Payer: Medicare HMO | Admitting: Neurology

## 2017-08-07 ENCOUNTER — Other Ambulatory Visit: Payer: Self-pay

## 2017-08-07 ENCOUNTER — Emergency Department (HOSPITAL_COMMUNITY)
Admission: EM | Admit: 2017-08-07 | Discharge: 2017-08-07 | Disposition: A | Discharge status: Home / Self Care | Payer: Medicare HMO | Attending: Emergency Medicine | Admitting: Emergency Medicine

## 2017-08-07 DIAGNOSIS — Z79899 Other long term (current) drug therapy: Secondary | ICD-10-CM | POA: Diagnosis not present

## 2017-08-07 DIAGNOSIS — R531 Weakness: Secondary | ICD-10-CM | POA: Diagnosis not present

## 2017-08-07 DIAGNOSIS — R569 Unspecified convulsions: Secondary | ICD-10-CM | POA: Insufficient documentation

## 2017-08-07 DIAGNOSIS — I130 Hypertensive heart and chronic kidney disease with heart failure and stage 1 through stage 4 chronic kidney disease, or unspecified chronic kidney disease: Secondary | ICD-10-CM | POA: Insufficient documentation

## 2017-08-07 DIAGNOSIS — F17210 Nicotine dependence, cigarettes, uncomplicated: Secondary | ICD-10-CM | POA: Insufficient documentation

## 2017-08-07 DIAGNOSIS — Z7982 Long term (current) use of aspirin: Secondary | ICD-10-CM | POA: Insufficient documentation

## 2017-08-07 DIAGNOSIS — N183 Chronic kidney disease, stage 3 (moderate): Secondary | ICD-10-CM | POA: Diagnosis not present

## 2017-08-07 DIAGNOSIS — I5042 Chronic combined systolic (congestive) and diastolic (congestive) heart failure: Secondary | ICD-10-CM | POA: Diagnosis not present

## 2017-08-07 DIAGNOSIS — R350 Frequency of micturition: Secondary | ICD-10-CM | POA: Insufficient documentation

## 2017-08-07 DIAGNOSIS — R29810 Facial weakness: Secondary | ICD-10-CM | POA: Diagnosis not present

## 2017-08-07 LAB — BASIC METABOLIC PANEL
Anion gap: 12 (ref 5–15)
BUN: 24 mg/dL — ABNORMAL HIGH (ref 8–23)
CO2: 24 mmol/L (ref 22–32)
Calcium: 9.7 mg/dL (ref 8.9–10.3)
Chloride: 104 mmol/L (ref 98–111)
Creatinine, Ser: 1.52 mg/dL — ABNORMAL HIGH (ref 0.44–1.00)
GFR calc Af Amer: 38 mL/min — ABNORMAL LOW (ref >60)
GFR calc non Af Amer: 33 mL/min — ABNORMAL LOW (ref >60)
Glucose, Bld: 108 mg/dL — ABNORMAL HIGH (ref 70–99)
Potassium: 4.3 mmol/L (ref 3.5–5.1)
Sodium: 140 mmol/L (ref 135–145)

## 2017-08-07 LAB — CBC
HCT: 37 % (ref 36.0–46.0)
Hemoglobin: 11.7 g/dL — ABNORMAL LOW (ref 12.0–15.0)
MCH: 31 pg (ref 26.0–34.0)
MCHC: 31.6 g/dL (ref 30.0–36.0)
MCV: 98.1 fL (ref 78.0–100.0)
Platelets: 223 10*3/uL (ref 150–400)
RBC: 3.77 MIL/uL — ABNORMAL LOW (ref 3.87–5.11)
RDW: 16 % — ABNORMAL HIGH (ref 11.5–15.5)
WBC: 5.6 10*3/uL (ref 4.0–10.5)

## 2017-08-07 LAB — URINALYSIS, ROUTINE W REFLEX MICROSCOPIC
Bilirubin Urine: NEGATIVE
Glucose, UA: NEGATIVE mg/dL
Hgb urine dipstick: NEGATIVE
Ketones, ur: NEGATIVE mg/dL
Nitrite: NEGATIVE
Protein, ur: 100 mg/dL — AB
Specific Gravity, Urine: 1.013 (ref 1.005–1.030)
pH: 6 (ref 5.0–8.0)

## 2017-08-07 LAB — MAGNESIUM: Magnesium: 2.2 mg/dL (ref 1.7–2.4)

## 2017-08-07 MED ORDER — CEPHALEXIN 500 MG PO CAPS: 500.0000 mg | ORAL_CAPSULE | Freq: Two times a day (BID) | ORAL | 0 refills | Status: AC

## 2017-08-07 NOTE — ED Notes (Signed)
Pt ambulated to restroom with assistance without event

## 2017-08-07 NOTE — ED Notes (Addendum)
Urine culture collected and sent down to Main Lab with UA.

## 2017-08-07 NOTE — ED Provider Notes (Signed)
[facility] EMERGENCY DEPARTMENT Provider Note   CSN: [id number] Arrival date & time: 08/07/17  1520  History   Chief Complaint Chief Complaint  Patient presents with  . Tremors   HPI  Patient is a 77 year old female with history of seizures controlled moderately patent Keppra, prior CVA, and HTN presenting to the ED for seizure like episode. Per patient, she was at rest at home when she had onset of, we were shaking which lasted for about 1 minute. She denies LOC, fall to ground, or fluid. No headache or vomiting. She states this episode was similar to prior seizures but usually involve rhythmic RLE shaking with preservation of consciousness. She states she would not have called EMS today but her friend who witnessed the seizure did. No changes in vision, change in speech, weakness, numbness. She currently feels well. Denies recent missed doses of antiepileptics. She states she's been urinating more frequently but otherwise denies any chest pain, dyspnea, URI symptoms, cough, or GI symptoms.  Past Medical History:  Diagnosis Date  . Congestive heart failure (CHF) (HCC)   . Epilepsy (HCC)   . Hypertension   . Seizures (HCC)   . Smoker   . Stroke [facility])     Patient Active Problem List   Diagnosis Date Noted  . Cough   . Encounter for palliative care   . Goals of care, counseling/discussion   . Pneumonia   . NICM (nonischemic cardiomyopathy) -EF 25% 05/20/2015  . No significant CAD at cath May 2012 05/20/2015  . Anemia 05/20/2015  . Diastolic dysfunction-grade 3 05/20/2015  . PNA (pneumonia) 05/20/2015  . Acute renal failure superimposed on stage 3 chronic kidney disease (HCC)   . CAP (community acquired pneumonia)   . Hyperammonemia (HCC) 05/17/2015  . Metabolic encephalopathy 05/16/2015  . Acute on chronic renal insufficiency 05/15/2015  . Chest pain 05/14/2015  . Troponin level elevated 05/14/2015  . Chronic combined systolic and diastolic CHF (congestive  heart failure) (HCC) 03/06/2014  . Acute combined systolic and diastolic congestive heart failure (HCC) 02/24/2013  . Weight gain 02/24/2013  . Volume overload 02/24/2013  . Hyperglycemia 02/24/2013  . Dyspnea 02/24/2013  . Constipation 02/24/2013  . Abdominal pain 02/24/2013  . Pleuritic chest pain 02/24/2013  . Epilepsy (HCC)   . Smoker   . 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 Protein-calorie malnutrition, severe- wgt 86 lbs 02/13/2013  . Pneumobilia 02/12/2013  . Influenza A 02/11/2013  . Hyperkalemia 02/11/2013  . Hyponatremia 02/11/2013  . HCAP (healthcare-associated pneumonia) 02/11/2013  . Liver enzyme elevation 02/11/2013  . Basal ganglia infarction (HCC) 01/08/2013  . H/O: CVA (cerebrovascular accident) 01/04/2013  . Generalized seizure (HCC) 12/28/2012  . 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 Seizure (HCC) 12/28/2012  . Metabolic acidosis 12/28/2012  . Encephalopathy 12/28/2012  . Acute renal failure (HCC) 12/28/2012    Past Surgical History:  Procedure Laterality Date  . NO PAST SURGERIES       OB History   None      Home Medications    Prior to Admission medications   Medication Sig Start Date End Date Taking? Authorizing Provider  allopurinol (ZYLOPRIM) 100 MG tablet Take 200 mg by mouth daily as needed (gout).  07/17/14  Yes [provider]  aspirin 81 MG EC tablet Take 81 mg by mouth every morning.    Yes [provider]  carvedilol (COREG) 6.25 MG tablet Take 1 tablet (6.25 mg total) by mouth 2 (two)  times daily with a meal. 03/09/14  Yes Rai, Ripudeep K, MD  furosemide (LASIX) 20 MG tablet Take 1 tablet (20 mg total) by mouth daily. 05/23/15  Yes Rodolph Bong, MD  Lacosamide (VIMPAT) 100 MG TABS Take 1 tablet (100 mg total) by mouth 2 (two) times daily. 03/03/17  Yes Micki Riley, MD  levETIRAcetam (KEPPRA) 500 MG tablet Take 1 tablet (500 mg total) by mouth at bedtime. 07/12/16  Yes Butch Penny, NP  losartan (COZAAR) 25 MG tablet Take 25 mg by mouth daily. 07/06/17  Yes [provider]  cephALEXin (KEFLEX) 500 MG capsule Take 1 capsule (500 mg total) by mouth 2 (two) times daily for 5 days. 08/07/17 08/12/17  Cecille Po, MD    Family History Family History  Problem Relation Age of Onset  . Cancer Father   . Anemia Daughter     Social History Social History   Tobacco Use  . Smoking status: Current Some Day Smoker    Packs/day: 0.25    Types: Cigarettes  . Smokeless tobacco: Never Used  . Tobacco comment: 1/2 caigarettes a day  Substance Use Topics  . Alcohol use: Yes    Alcohol/week: 0.0 oz    Comment: occasional  . Drug use: No     Allergies   Patient has no known allergies.   Review of Systems Review of Systems  Constitutional: Negative for fever.  HENT: Negative for congestion and sore throat.   Eyes: Negative for visual disturbance.  Respiratory: Negative for shortness of breath.   Cardiovascular: Negative for chest pain.  Gastrointestinal: Negative for abdominal pain, diarrhea and vomiting.  Genitourinary: Positive for frequency. Negative for dysuria.  Musculoskeletal: Negative for neck pain.  Neurological: Positive for seizures. Negative for syncope.  All other systems reviewed and are negative.    Physical Exam Updated Vital Signs BP (!) 168/102 (BP Location: Right Arm)   Pulse 80   Temp 98.4 F (36.9 C) (Oral)   Resp 20   Ht 5\' 2"  (1.575 m)   Wt 40.8 kg (90 lb)   SpO2 100%   BMI 16.46 kg/m   Physical Exam  Constitutional: She is oriented to person, place, and time. No distress.  HENT:  Head: Normocephalic and atraumatic.  Mouth/Throat: Oropharynx is clear and moist.  Eyes: Pupils are equal, round, and reactive to light. Conjunctivae and EOM are normal.  Neck: Neck supple. No tracheal deviation present.  Cardiovascular: Normal rate, regular rhythm, normal heart sounds and intact distal pulses.  No murmur  heard. Pulmonary/Chest: Effort normal and breath sounds normal. No stridor. No respiratory distress. She has no wheezes. She has no rales.  Abdominal: Soft. She exhibits no distension and no mass. There is no tenderness. There is no guarding.  Musculoskeletal: She exhibits no edema or deformity.  Neurological: She is alert and oriented to person, place, and time.  Speech is fluent. CN II-XII tested and found to be intact. 5/5 motor strength and sensation intact to light touch in all charities. Equal grips. No dysmetria on finger to nose. Normal gait. Negative Romberg.  Skin: Skin is warm and dry.  Psychiatric: She has a normal mood and affect. Her behavior is normal.  Nursing note and vitals reviewed.    ED Treatments / Results  Labs (all labs ordered are listed, but only abnormal results are displayed) Labs Reviewed  BASIC METABOLIC PANEL - Abnormal; Notable for the following components:      Result Value   Glucose,  Bld 108 (*)    BUN 24 (*)    Creatinine, Ser 1.52 (*)    GFR calc non Af Amer 33 (*)    GFR calc Af Amer 38 (*)    All other components within normal limits  URINALYSIS, ROUTINE W REFLEX MICROSCOPIC - Abnormal; Notable for the following components:   APPearance HAZY (*)    Protein, ur 100 (*)    Leukocytes, UA SMALL (*)    Bacteria, UA RARE (*)    All other components within normal limits  CBC - Abnormal; Notable for the following components:   RBC 3.77 (*)    Hemoglobin 11.7 (*)    RDW 16.0 (*)    All other components within normal limits  MAGNESIUM  LEVETIRACETAM LEVEL    EKG EKG Interpretation  Date/Time:  Monday August 07 2017 15:25:35 EDT Ventricular Rate:  88 PR Interval:    QRS Duration: 106 QT Interval:  396 QTC Calculation: 480 R Axis:   -152 Text Interpretation:  Right and left arm electrode reversal, interpretation assumes no reversal Sinus rhythm Probable left atrial enlargement Consider left ventricular hypertrophy Inferior infarct, old  Probable lateral infarct, age indeterminate Nonspecific T abnormalities, lateral leads No STEMI.  Confirmed by Alona Bene [phone]) on 08/07/2017 3:30:29 PM   Radiology No results found.  Procedures Procedures (including critical care time)  Medications Ordered in ED Medications - No data to display   Initial Impression / Assessment and Plan / ED Course  I have reviewed the triage vital signs and the nursing notes.  Pertinent labs & imaging results that were available during my care of the patient were reviewed by me and considered in my medical decision making (see chart for details).  Patient presenting for episode of rhythmic shaking of lower extremity consistent with history of focal seizures. No head injury or any correlation or CT head. Screening labs are reassuring with no significant electrolyte derangement and renal function at baseline. Keppra level sent and pending for follow-up. Neurologic exam is normal and she is ambulatory in the ED. With her ROS complain of urinary frequency, UA obtained which does show small leukocytes and bacteria in the absence of epithelial cells. Discussed options with patient and will treat with short course of Keflex. No signs of systemic illness from UTI to suggest pyelonephritis, and no signs of other injury or illness. Will discharge home.  Patient informed of all ED findings. Return precautions and follow up plan reviewed. Specifically advised she call her neurologist tomorrow. All questions answered.   Final Clinical Impressions(s) / ED Diagnoses   Final diagnoses:  Seizure-like activity [facility])    ED Discharge Orders        Ordered    cephALEXin (KEFLEX) 500 MG capsule  2 times daily     08/07/17 1850       Cecille Po, MD 08/07/17 1900    Maia Plan, MD 08/08/17 1104

## 2017-08-08 ENCOUNTER — Telehealth: Payer: Self-pay | Admitting: Adult Health

## 2017-08-08 NOTE — Telephone Encounter (Signed)
Pt and pts friend James(not on DPR) called stating hat the pt was seen in the ED last night in regards to a seizure. Stating she is needing to f/u within 1 day with the NP or MD. Once advised that Dr. Pearlean Brownie is currently working in the hospital and the NP could offer a work in spot of 7:30 on 7/15. Fayrene Fearing then spoke over me saying "we didn't understand the urgency of this appt and the pt could die before then" Pt then took the phone back and agreed to appt time.

## 2017-08-08 NOTE — Telephone Encounter (Signed)
Ok to see Monday

## 2017-08-08 NOTE — Telephone Encounter (Signed)
[name]- is appt on Monday okay or would you like to work her in sooner?

## 2017-08-08 NOTE — Telephone Encounter (Signed)
Noted  

## 2017-08-09 DIAGNOSIS — I129 Hypertensive chronic kidney disease with stage 1 through stage 4 chronic kidney disease, or unspecified chronic kidney disease: Secondary | ICD-10-CM | POA: Diagnosis not present

## 2017-08-10 LAB — LEVETIRACETAM LEVEL: Levetiracetam Lvl: NOT DETECTED ug/mL (ref 10.0–40.0)

## 2017-08-14 ENCOUNTER — Encounter: Payer: Self-pay | Admitting: Adult Health

## 2017-08-14 ENCOUNTER — Ambulatory Visit: Payer: Medicare HMO | Admitting: Adult Health

## 2017-08-14 VITALS — BP 167/98 | HR 77 | Ht 62.0 in | Wt 83.0 lb

## 2017-08-14 DIAGNOSIS — R569 Unspecified convulsions: Secondary | ICD-10-CM

## 2017-08-14 MED ORDER — LEVETIRACETAM 500 MG PO TABS: ORAL_TABLET | ORAL | 3 refills | Status: DC

## 2017-08-14 NOTE — Patient Instructions (Addendum)
  Your Plan:  Continue Vimpat 100 mg twice a day Restart Keppra 250 mg twice a day (1/2 tablet twice a day) Get a pill box- family to supervise medication If you have any additional seizure events please let us know No driving until seizure free for 6 months If your symptoms worsen or you develop new symptoms please let us know.    Thank you for coming to see [place] at [facility]. I hope we have been able to provide you high quality care today.  You may receive a patient satisfaction survey over the next few weeks. We would appreciate your feedback and comments so that we may continue to improve ourselves and the health of our patients.

## 2017-08-14 NOTE — Progress Notes (Signed)
PATIENT: Tzipora Mcinroy DOB: March 30, 1940  REASON FOR VISIT: follow up HISTORY FROM: patient  HISTORY OF PRESENT ILLNESS: Today 08/14/17:  Ms. Wildrick is a 77 year old female with a history of seizures.  She returns today for follow-up.  The patient had a seizure event and was taken to the hospital.  At the hospital she was treated for a urinary tract infection.  Her seizure medication was not adjusted.  Her Keppra level came back as none detected.  The patient reports that she has been taking her medication.  Reports that she takes half a tablet of Keppra in the morning and again at bedtime.  Her family is with her today.  They do not supervise her medications.She denies any changes in her mood or behavior.  No changes with her gait or balance.  She does not operate a motor vehicle..  She is able to complete all ADLs independently.  She returns today for an evaluation.  HISTORY 07/12/16: Mrs. Cagley is a 77 year old female with a history of seizures. She returns today for follow-up. She remains on Keppra taking 500 mg at bedtime and Vimpat 100 mg twice a day. She denies any seizure events. Denies any changes with her gait or balance. No change in mood or behavior. She is able to operate a motor vehicle. She completes all ADLs independently. She denies any additional neurological symptoms. She returns today for an evaluation.     REVIEW OF SYSTEMS: Out of a complete 14 system review of symptoms, the patient complains only of the following symptoms, and all other reviewed systems are negative.  See HPI  ALLERGIES: No Known Allergies  HOME MEDICATIONS: Outpatient Medications Prior to Visit  Medication Sig Dispense Refill  . allopurinol (ZYLOPRIM) 100 MG tablet Take 200 mg by mouth daily as needed (gout).   5  . aspirin 81 MG EC tablet Take 81 mg by mouth every morning.     . carvedilol (COREG) 6.25 MG tablet Take 1 tablet (6.25 mg total) by mouth 2 (two) times daily with a meal. 60  tablet 3  . furosemide (LASIX) 20 MG tablet Take 1 tablet (20 mg total) by mouth daily. 30 tablet 1  . Lacosamide (VIMPAT) 100 MG TABS Take 1 tablet (100 mg total) by mouth 2 (two) times daily. 180 tablet 3  . levETIRAcetam (KEPPRA) 500 MG tablet Take 1 tablet (500 mg total) by mouth at bedtime. 90 tablet 3  . losartan (COZAAR) 25 MG tablet Take 25 mg by mouth daily.  5   No facility-administered medications prior to visit.     PAST MEDICAL HISTORY: Past Medical History:  Diagnosis Date  . Congestive heart failure (CHF) (HCC)   . Epilepsy (HCC)   . Hypertension   . Seizures (HCC)   . Smoker   . Stroke [facility])     PAST SURGICAL HISTORY: Past Surgical History:  Procedure Laterality Date  . NO PAST SURGERIES      FAMILY HISTORY: Family History  Problem Relation Age of Onset  . Cancer Father   . Anemia Daughter     SOCIAL HISTORY: Social History   Socioeconomic History  . Marital status: Single    Spouse name: Not on file  . Number of children: 3  . Years of education: 20  . Highest education level: Not on file  Occupational History    Comment: Retired  Engineer, production  . Financial resource strain: Not on file  . Food insecurity:    Worry: Not  on file    Inability: Not on file  . Transportation needs:    Medical: Not on file    Non-medical: Not on file  Tobacco Use  . Smoking status: Current Some Day Smoker    Packs/day: 0.25    Types: Cigarettes  . Smokeless tobacco: Never Used  . Tobacco comment: 1/2 caigarettes a day  Substance and Sexual Activity  . Alcohol use: Yes    Alcohol/week: 0.0 oz    Comment: occasional  . Drug use: No  . Sexual activity: Never  Lifestyle  . Physical activity:    Days per week: Not on file    Minutes per session: Not on file  . Stress: Not on file  Relationships  . Social connections:    Talks on phone: Not on file    Gets together: Not on file    Attends religious service: Not on file    Active member of club or  organization: Not on file    Attends meetings of clubs or organizations: Not on file    Relationship status: Not on file  . Intimate partner violence:    Fear of current or ex partner: Not on file    Emotionally abused: Not on file    Physically abused: Not on file    Forced sexual activity: Not on file  Other Topics Concern  . Not on file  Social History Narrative   Patient lives at home alone.    Retired.   Education one year of college.   Left handed.   Caffeine one cup of coffee daily and one cup of tea.      PHYSICAL EXAM  Vitals:   08/14/17 0734  BP: (!) 167/98  Pulse: 77  Weight: 83 lb (37.6 kg)  Height: 5\' 2"  (1.575 m)   Body mass index is 15.18 kg/m.  Generalized: Well developed, in no acute distress   Neurological examination  Mentation: Alert oriented to time, place, history taking. Follows all commands speech and language fluent Cranial nerve II-XII: Pupils were equal round reactive to light. Extraocular movements were full, visual field were full on confrontational test. Facial sensation and strength were normal. Uvula tongue midline. Head turning and shoulder shrug  were normal and symmetric. Motor: The motor testing reveals 5 over 5 strength of all 4 extremities. Good symmetric motor tone is noted throughout.  Sensory: Sensory testing is intact to soft touch on all 4 extremities. No evidence of extinction is noted.  Coordination: Cerebellar testing reveals good finger-nose-finger and heel-to-shin bilaterally.  Gait and station: Gait is normal. Tandem gait is unsteady. Romberg is negative. No drift is seen.  Reflexes: Deep tendon reflexes are symmetric and normal bilaterally.   DIAGNOSTIC DATA (LABS, IMAGING, TESTING) - I reviewed patient records, labs, notes, testing and imaging myself where available.  Lab Results  Component Value Date   WBC 5.6 08/07/2017   HGB 11.7 (L) 08/07/2017   HCT 37.0 08/07/2017   MCV 98.1 08/07/2017   PLT 223 08/07/2017       Component Value Date/Time   NA 140 08/07/2017 1540   NA 135 (A) 05/23/2015   K 4.3 08/07/2017 1540   CL 104 08/07/2017 1540   CO2 24 08/07/2017 1540   GLUCOSE 108 (H) 08/07/2017 1540   BUN 24 (H) 08/07/2017 1540   BUN 40 (A) 05/23/2015   CREATININE 1.52 (H) 08/07/2017 1540   CREATININE 1.42 (H) 06/04/2015 1515   CALCIUM 9.7 08/07/2017 1540   PROT 7.9 05/14/2015  0356   ALBUMIN 4.3 05/14/2015 0356   AST 85 (H) 05/14/2015 0356   ALT 74 (H) 05/14/2015 0356   ALKPHOS 158 (H) 05/14/2015 0356   BILITOT 1.0 05/14/2015 0356   GFRNONAA 33 (L) 08/07/2017 1540   GFRAA 38 (L) 08/07/2017 1540   Lab Results  Component Value Date   CHOL 175 03/06/2014   HDL 68 03/06/2014   LDLCALC 92 03/06/2014   TRIG 73 03/06/2014   CHOLHDL 2.6 03/06/2014   Lab Results  Component Value Date   HGBA1C 5.8 (H) 03/06/2014   Lab Results  Component Value Date   VITAMINB12 516 05/16/2015   Lab Results  Component Value Date   TSH 1.765 05/16/2015      ASSESSMENT AND PLAN 77 y.o. year old female  has a past medical history of Congestive heart failure (CHF) (HCC), Epilepsy (HCC), Hypertension, Seizures (HCC), Smoker, and Stroke (HCC). here with:  1.  Seizures  The patient had a seizure event.  She was also diagnosed with a urinary tract infection.  Her blood work showed that she has not been taking Keppra consistently.  I advised that she should continue on Vimpat 100 mg twice a day.  She should resume Keppra 250 mg twice a day.  I will recheck blood work today.  I encouraged the patient to use a pillbox.  Also encouraged her family to oversee her filling her medications each week.  We also discussed seizure precautions.  She should not operate a motor vehicle until seizure-free for 6 months.  If she has any additional seizure events she should let us know..  She will follow-up in 6 months or sooner if needed  I spent 25 minutes with the patient. 50% of this time was spent discussing her medications  and seizure precautions.   Butch Penny, MSN, NP-C 08/14/2017, 7:26 AM [facility] [address] [place], [place] [address] [phone]

## 2017-08-16 ENCOUNTER — Telehealth: Payer: Self-pay | Admitting: *Deleted

## 2017-08-16 LAB — LEVETIRACETAM LEVEL: Levetiracetam Lvl: 32.5 ug/mL (ref 10.0–40.0)

## 2017-08-16 NOTE — Telephone Encounter (Signed)
Left patient a detailed message, with results, on voicemail (ok per DPR).  Provided our number to call back with any questions.  

## 2017-08-16 NOTE — Telephone Encounter (Signed)
Advised patient of unremarkable blood work per previous message.

## 2017-08-16 NOTE — Telephone Encounter (Signed)
-----   Message from Butch Penny, NP sent at 08/16/2017 10:08 AM EDT ----- Blood work unremarkable. Please call patient

## 2017-08-20 NOTE — Progress Notes (Signed)
I have read the note, and I agree with the clinical assessment and plan.  Charles K Willis   

## 2017-09-20 ENCOUNTER — Telehealth: Payer: Self-pay | Admitting: Adult Health

## 2017-09-20 NOTE — Telephone Encounter (Signed)
Patient calling regarding patient assistance for Lacosamide (VIMPAT) 100 MG TABS.

## 2017-09-21 ENCOUNTER — Other Ambulatory Visit: Payer: Self-pay | Admitting: Neurology

## 2017-09-22 NOTE — Telephone Encounter (Signed)
I have called Patient 's refill in . [facility] will call patient back to schedule delivery . Called and left patient a message.

## 2017-09-25 ENCOUNTER — Ambulatory Visit: Payer: Medicare HMO | Admitting: Neurology

## 2017-10-16 DIAGNOSIS — N183 Chronic kidney disease, stage 3 (moderate): Secondary | ICD-10-CM | POA: Diagnosis not present

## 2017-10-16 DIAGNOSIS — I509 Heart failure, unspecified: Secondary | ICD-10-CM | POA: Diagnosis not present

## 2017-10-16 DIAGNOSIS — G40909 Epilepsy, unspecified, not intractable, without status epilepticus: Secondary | ICD-10-CM | POA: Diagnosis not present

## 2017-10-16 DIAGNOSIS — M109 Gout, unspecified: Secondary | ICD-10-CM | POA: Diagnosis not present

## 2017-10-16 DIAGNOSIS — F17200 Nicotine dependence, unspecified, uncomplicated: Secondary | ICD-10-CM | POA: Diagnosis not present

## 2017-10-16 DIAGNOSIS — I129 Hypertensive chronic kidney disease with stage 1 through stage 4 chronic kidney disease, or unspecified chronic kidney disease: Secondary | ICD-10-CM | POA: Diagnosis not present

## 2017-10-16 DIAGNOSIS — I639 Cerebral infarction, unspecified: Secondary | ICD-10-CM | POA: Diagnosis not present

## 2017-10-16 DIAGNOSIS — N2581 Secondary hyperparathyroidism of renal origin: Secondary | ICD-10-CM | POA: Diagnosis not present

## 2017-10-16 DIAGNOSIS — D631 Anemia in chronic kidney disease: Secondary | ICD-10-CM | POA: Diagnosis not present

## 2017-11-16 DIAGNOSIS — R079 Chest pain, unspecified: Secondary | ICD-10-CM | POA: Diagnosis not present

## 2017-11-16 DIAGNOSIS — Z1389 Encounter for screening for other disorder: Secondary | ICD-10-CM | POA: Diagnosis not present

## 2017-11-16 DIAGNOSIS — I10 Essential (primary) hypertension: Secondary | ICD-10-CM | POA: Diagnosis not present

## 2017-11-16 DIAGNOSIS — R109 Unspecified abdominal pain: Secondary | ICD-10-CM | POA: Diagnosis not present

## 2017-11-17 ENCOUNTER — Other Ambulatory Visit: Payer: Self-pay | Admitting: Family Medicine

## 2017-11-17 ENCOUNTER — Ambulatory Visit
Admission: RE | Admit: 2017-11-17 | Discharge: 2017-11-17 | Disposition: A | Discharge status: Home / Self Care | Payer: Medicare HMO | Source: Ambulatory Visit | Attending: Family Medicine | Admitting: Family Medicine

## 2017-11-17 ENCOUNTER — Ambulatory Visit: Payer: Medicare HMO

## 2017-11-17 DIAGNOSIS — R079 Chest pain, unspecified: Secondary | ICD-10-CM

## 2017-11-17 DIAGNOSIS — M79621 Pain in right upper arm: Secondary | ICD-10-CM

## 2017-11-17 DIAGNOSIS — Z1231 Encounter for screening mammogram for malignant neoplasm of breast: Secondary | ICD-10-CM

## 2017-11-17 DIAGNOSIS — R0789 Other chest pain: Secondary | ICD-10-CM | POA: Diagnosis not present

## 2018-02-13 DIAGNOSIS — I509 Heart failure, unspecified: Secondary | ICD-10-CM | POA: Diagnosis not present

## 2018-02-13 DIAGNOSIS — M109 Gout, unspecified: Secondary | ICD-10-CM | POA: Diagnosis not present

## 2018-02-13 DIAGNOSIS — N189 Chronic kidney disease, unspecified: Secondary | ICD-10-CM | POA: Diagnosis not present

## 2018-02-13 DIAGNOSIS — I639 Cerebral infarction, unspecified: Secondary | ICD-10-CM | POA: Diagnosis not present

## 2018-02-13 DIAGNOSIS — D631 Anemia in chronic kidney disease: Secondary | ICD-10-CM | POA: Diagnosis not present

## 2018-02-13 DIAGNOSIS — N183 Chronic kidney disease, stage 3 (moderate): Secondary | ICD-10-CM | POA: Diagnosis not present

## 2018-02-13 DIAGNOSIS — I129 Hypertensive chronic kidney disease with stage 1 through stage 4 chronic kidney disease, or unspecified chronic kidney disease: Secondary | ICD-10-CM | POA: Diagnosis not present

## 2018-02-13 DIAGNOSIS — R748 Abnormal levels of other serum enzymes: Secondary | ICD-10-CM | POA: Diagnosis not present

## 2018-02-13 DIAGNOSIS — N2581 Secondary hyperparathyroidism of renal origin: Secondary | ICD-10-CM | POA: Diagnosis not present

## 2018-02-13 DIAGNOSIS — G40909 Epilepsy, unspecified, not intractable, without status epilepticus: Secondary | ICD-10-CM | POA: Diagnosis not present

## 2018-03-02 DIAGNOSIS — N183 Chronic kidney disease, stage 3 (moderate): Secondary | ICD-10-CM | POA: Diagnosis not present

## 2018-03-02 DIAGNOSIS — I129 Hypertensive chronic kidney disease with stage 1 through stage 4 chronic kidney disease, or unspecified chronic kidney disease: Secondary | ICD-10-CM | POA: Diagnosis not present

## 2018-03-06 ENCOUNTER — Encounter: Payer: Self-pay | Admitting: Adult Health

## 2018-03-06 ENCOUNTER — Ambulatory Visit: Payer: Medicare HMO | Admitting: Adult Health

## 2018-03-06 VITALS — BP 166/95 | HR 66 | Ht 62.0 in | Wt 78.8 lb

## 2018-03-06 DIAGNOSIS — R569 Unspecified convulsions: Secondary | ICD-10-CM | POA: Diagnosis not present

## 2018-03-06 MED ORDER — LACOSAMIDE 100 MG PO TABS: ORAL_TABLET | ORAL | 2 refills | Status: DC

## 2018-03-06 NOTE — Progress Notes (Signed)
I have read the note, and I agree with the clinical assessment and plan.  Charles K Willis   

## 2018-03-06 NOTE — Progress Notes (Signed)
PATIENT: [name] DOB: Mar 24, 1940  REASON FOR VISIT: follow up HISTORY FROM: patient  HISTORY OF PRESENT ILLNESS: Today 03/06/18  [name] is a 78 year old female who presents for follow-up for seizure.  She is currently taking Keppra 500 mg daily and Vimpat 100 mg twice a day. She denies any seizures since she was in the hospital in June 2019.  She hasn't been driving for 6 months. She lives alone. She does her own ADLs and manages her own medications. She is currently working with her kidney doctor to get good control of her BP.  She presents today for follow-up with her friend [name].  She denies any problems or concerns.  She reports that she has been taking her seizure medications as prescribed.  HISTORY  [name] is a 78 year old female with a history of seizures.  She returns today for follow-up.  The patient had a seizure event and was taken to the hospital.  At the hospital she was treated for a urinary tract infection.  Her seizure medication was not adjusted.  Her Keppra level came back as none detected.  The patient reports that she has been taking her medication.  Reports that she takes half a tablet of Keppra in the morning and again at bedtime.  Her family is with her today.  They do not supervise her medications.She denies any changes in her mood or behavior.  No changes with her gait or balance.  She does not operate a motor vehicle..  She is able to complete all ADLs independently.  She returns today for an evaluation.  REVIEW OF SYSTEMS: Out of a complete 14 system review of symptoms, the patient complains only of the following symptoms, and all other reviewed systems are negative.  Fatigue ALLERGIES: No Known Allergies  HOME MEDICATIONS: Outpatient Medications Prior to Visit  Medication Sig Dispense Refill  . allopurinol (ZYLOPRIM) 100 MG tablet Take 200 mg by mouth daily as needed (gout).   5  . aspirin 81 MG EC tablet Take 81 mg by mouth every morning.       . carvedilol (COREG) 6.25 MG tablet Take 1 tablet (6.25 mg total) by mouth 2 (two) times daily with a meal. (Patient taking differently: Take 12.5 mg by mouth 2 (two) times daily with a meal. ) 60 tablet 3  . furosemide (LASIX) 20 MG tablet Take 1 tablet (20 mg total) by mouth daily. 30 tablet 1  . levETIRAcetam (KEPPRA) 500 MG tablet Take 1/2 tablet PO twice a day 90 tablet 3  . losartan (COZAAR) 25 MG tablet Take 25 mg by mouth daily.  5  . VIMPAT 100 MG TABS Take 1 tablet by mouth twice a day as directed by physician. 180 tablet 2   No facility-administered medications prior to visit.     PAST MEDICAL HISTORY: Past Medical History:  Diagnosis Date  . Congestive heart failure (CHF) (HCC)   . Epilepsy (HCC)   . Hypertension   . Seizures (HCC)   . Smoker   . Stroke [facility])     PAST SURGICAL HISTORY: Past Surgical History:  Procedure Laterality Date  . NO PAST SURGERIES      FAMILY HISTORY: Family History  Problem Relation Age of Onset  . Cancer Father   . Anemia Daughter     SOCIAL HISTORY: Social History   Socioeconomic History  . Marital status: Single    Spouse name: Not on file  . Number of children: 3  . Years  of education: 7  . Highest education level: Not on file  Occupational History    Comment: Retired  Engineer, production  . Financial resource strain: Not on file  . Food insecurity:    Worry: Not on file    Inability: Not on file  . Transportation needs:    Medical: Not on file    Non-medical: Not on file  Tobacco Use  . Smoking status: Current Some Day Smoker    Packs/day: 0.25    Types: Cigarettes  . Smokeless tobacco: Never Used  . Tobacco comment: 1/2 caigarettes a day  Substance and Sexual Activity  . Alcohol use: Yes    Alcohol/week: 0.0 standard drinks    Comment: occasional  . Drug use: No  . Sexual activity: Never  Lifestyle  . Physical activity:    Days per week: Not on file    Minutes per session: Not on file  . Stress: Not on file   Relationships  . Social connections:    Talks on phone: Not on file    Gets together: Not on file    Attends religious service: Not on file    Active member of club or organization: Not on file    Attends meetings of clubs or organizations: Not on file    Relationship status: Not on file  . Intimate partner violence:    Fear of current or ex partner: Not on file    Emotionally abused: Not on file    Physically abused: Not on file    Forced sexual activity: Not on file  Other Topics Concern  . Not on file  Social History Narrative   Patient lives at home alone.    Retired.   Education one year of college.   Left handed.   Caffeine one cup of coffee daily and one cup of tea.      PHYSICAL EXAM  Vitals:   03/06/18 1347  BP: (!) 166/95  Pulse: 66  Weight: 78 lb 12.8 oz (35.7 kg)  Height: 5\' 2"  (1.575 m)   Body mass index is 14.41 kg/m.  Generalized: Well developed, in no acute distress   Neurological examination  Mentation: Alert oriented to time, place, history taking. Follows all commands speech and language fluent Cranial nerve II-XII: Pupils were equal round reactive to light. Extraocular movements were full, visual field were full on confrontational test. Facial sensation and strength were normal. Uvula tongue midline. Head turning and shoulder shrug  were normal and symmetric. Motor: The motor testing reveals 5 over 5 strength of all 4 extremities. Good symmetric motor tone is noted throughout.  Sensory: Sensory testing is intact to soft touch on all 4 extremities. No evidence of extinction is noted.  Coordination: Cerebellar testing reveals good finger-nose-finger and heel-to-shin bilaterally.  Gait and station: Gait is normal, but a bit slow. Tandem gait is normal. Romberg is negative. No drift is seen.  Reflexes: Deep tendon reflexes are symmetric and normal bilaterally.   DIAGNOSTIC DATA (LABS, IMAGING, TESTING) - I reviewed patient records, labs, notes,  testing and imaging myself where available.  Lab Results  Component Value Date   WBC 5.6 08/07/2017   HGB 11.7 (L) 08/07/2017   HCT 37.0 08/07/2017   MCV 98.1 08/07/2017   PLT 223 08/07/2017      Component Value Date/Time   NA 140 08/07/2017 1540   NA 135 (A) 05/23/2015   K 4.3 08/07/2017 1540   CL 104 08/07/2017 1540   CO2 24 08/07/2017  1540   GLUCOSE 108 (H) 08/07/2017 1540   BUN 24 (H) 08/07/2017 1540   BUN 40 (A) 05/23/2015   CREATININE 1.52 (H) 08/07/2017 1540   CREATININE 1.42 (H) 06/04/2015 1515   CALCIUM 9.7 08/07/2017 1540   PROT 7.9 05/14/2015 0356   ALBUMIN 4.3 05/14/2015 0356   AST 85 (H) 05/14/2015 0356   ALT 74 (H) 05/14/2015 0356   ALKPHOS 158 (H) 05/14/2015 0356   BILITOT 1.0 05/14/2015 0356   GFRNONAA 33 (L) 08/07/2017 1540   GFRAA 38 (L) 08/07/2017 1540   Lab Results  Component Value Date   CHOL 175 03/06/2014   HDL 68 03/06/2014   LDLCALC 92 03/06/2014   TRIG 73 03/06/2014   CHOLHDL 2.6 03/06/2014   Lab Results  Component Value Date   HGBA1C 5.8 (H) 03/06/2014   Lab Results  Component Value Date   VITAMINB12 516 05/16/2015   Lab Results  Component Value Date   TSH 1.765 05/16/2015      ASSESSMENT AND PLAN 78 y.o. year old female  has a past medical history of Congestive heart failure (CHF) (HCC), Epilepsy (HCC), Hypertension, Seizures (HCC), Smoker, and Stroke (HCC). here with:  1. Seizures   [name] has not had any seizure episodes since July 2019.  She reports that she has been taking Keppra and Vimpat as prescribed.  She has not been driving since July 19142019.  We will check blood levels today.  If blood levels are therapeutic for Keppra and Vimpat we will allow her to drive since she has been seizure-free since July 2019.  A refill was sent for her Vimpat.  She will follow-up in 6 months or sooner.  She will call our office if she has any questions or any concerns.  I spent 15 minutes with the patient. 50% of this time was spent  discussing her plan of care.    Margie EgeSarah Skylynne Schlechter, AGNP-C, DNP 03/06/2018, 2:08 PM [facility] [address], [address] [place], [place] [id number] [phone]

## 2018-03-08 ENCOUNTER — Telehealth: Payer: Self-pay | Admitting: Adult Health

## 2018-03-08 LAB — LACOSAMIDE: Lacosamide: 11.5 ug/mL — ABNORMAL HIGH (ref 5.0–10.0)

## 2018-03-08 LAB — LEVETIRACETAM LEVEL: Levetiracetam Lvl: 35.1 ug/mL (ref 10.0–40.0)

## 2018-03-08 NOTE — Telephone Encounter (Signed)
Pt is requesting lab results.

## 2018-03-08 NOTE — Telephone Encounter (Signed)
I called the patient and told her that her lab work looks good. She has not had a seizure since July 2019. From a seizure standpoint , it has been greater than 6 months since her last seizure event. She should be okay to drive from a seizure stand point. I advised her to continue taking her medications and that she should not drive if a seizure event occurs. She will contact our office if any new symptoms develop or if she has any concerns. I discussed this with Dr. Anne Hahn and he agreed.

## 2018-03-13 ENCOUNTER — Ambulatory Visit: Payer: Medicare HMO | Admitting: Adult Health

## 2018-03-19 DIAGNOSIS — N183 Chronic kidney disease, stage 3 (moderate): Secondary | ICD-10-CM | POA: Diagnosis not present

## 2018-06-12 DIAGNOSIS — N183 Chronic kidney disease, stage 3 (moderate): Secondary | ICD-10-CM | POA: Diagnosis not present

## 2018-06-12 DIAGNOSIS — G40909 Epilepsy, unspecified, not intractable, without status epilepticus: Secondary | ICD-10-CM | POA: Diagnosis not present

## 2018-06-12 DIAGNOSIS — D631 Anemia in chronic kidney disease: Secondary | ICD-10-CM | POA: Diagnosis not present

## 2018-06-12 DIAGNOSIS — M109 Gout, unspecified: Secondary | ICD-10-CM | POA: Diagnosis not present

## 2018-06-12 DIAGNOSIS — R748 Abnormal levels of other serum enzymes: Secondary | ICD-10-CM | POA: Diagnosis not present

## 2018-06-12 DIAGNOSIS — I639 Cerebral infarction, unspecified: Secondary | ICD-10-CM | POA: Diagnosis not present

## 2018-06-12 DIAGNOSIS — I509 Heart failure, unspecified: Secondary | ICD-10-CM | POA: Diagnosis not present

## 2018-06-12 DIAGNOSIS — N2581 Secondary hyperparathyroidism of renal origin: Secondary | ICD-10-CM | POA: Diagnosis not present

## 2018-06-12 DIAGNOSIS — I129 Hypertensive chronic kidney disease with stage 1 through stage 4 chronic kidney disease, or unspecified chronic kidney disease: Secondary | ICD-10-CM | POA: Diagnosis not present

## 2018-06-12 DIAGNOSIS — N189 Chronic kidney disease, unspecified: Secondary | ICD-10-CM | POA: Diagnosis not present

## 2018-06-21 ENCOUNTER — Other Ambulatory Visit: Payer: Self-pay | Admitting: Adult Health

## 2018-06-21 MED ORDER — LACOSAMIDE 100 MG PO TABS: ORAL_TABLET | ORAL | 2 refills | Status: DC

## 2018-06-21 NOTE — Telephone Encounter (Signed)
Pt has called for a refill on her Lacosamide (VIMPAT) 100 MG TABS  [facility]

## 2018-06-21 NOTE — Addendum Note (Signed)
Addended by: Guy Begin on: 06/21/2018 12:55 PM   Modules accepted: Orders

## 2018-06-21 NOTE — Telephone Encounter (Signed)
I called [facility], spoke to [name], received prescription but needs to have MD (from what I see on my end did have Dr Terrace Arabia w/ [facility] and [facility]), but they do not see on there end.  Will resend electronically.

## 2018-06-22 DIAGNOSIS — Z0000 Encounter for general adult medical examination without abnormal findings: Secondary | ICD-10-CM | POA: Diagnosis not present

## 2018-06-22 DIAGNOSIS — N183 Chronic kidney disease, stage 3 (moderate): Secondary | ICD-10-CM | POA: Diagnosis not present

## 2018-06-22 DIAGNOSIS — E46 Unspecified protein-calorie malnutrition: Secondary | ICD-10-CM | POA: Diagnosis not present

## 2018-06-22 DIAGNOSIS — G40909 Epilepsy, unspecified, not intractable, without status epilepticus: Secondary | ICD-10-CM | POA: Diagnosis not present

## 2018-06-22 DIAGNOSIS — F17200 Nicotine dependence, unspecified, uncomplicated: Secondary | ICD-10-CM | POA: Diagnosis not present

## 2018-06-22 DIAGNOSIS — M109 Gout, unspecified: Secondary | ICD-10-CM | POA: Diagnosis not present

## 2018-06-22 DIAGNOSIS — M810 Age-related osteoporosis without current pathological fracture: Secondary | ICD-10-CM | POA: Diagnosis not present

## 2018-06-22 DIAGNOSIS — I129 Hypertensive chronic kidney disease with stage 1 through stage 4 chronic kidney disease, or unspecified chronic kidney disease: Secondary | ICD-10-CM | POA: Diagnosis not present

## 2018-06-22 DIAGNOSIS — R1032 Left lower quadrant pain: Secondary | ICD-10-CM | POA: Diagnosis not present

## 2018-07-23 ENCOUNTER — Other Ambulatory Visit: Payer: Self-pay

## 2018-07-23 ENCOUNTER — Encounter (HOSPITAL_COMMUNITY): Payer: Self-pay | Admitting: Emergency Medicine

## 2018-07-23 ENCOUNTER — Emergency Department (HOSPITAL_COMMUNITY)
Admission: EM | Admit: 2018-07-23 | Discharge: 2018-07-24 | Disposition: A | Discharge status: Home / Self Care | Payer: Medicare HMO | Attending: Emergency Medicine | Admitting: Emergency Medicine

## 2018-07-23 ENCOUNTER — Emergency Department (HOSPITAL_COMMUNITY): Payer: Medicare HMO

## 2018-07-23 DIAGNOSIS — Z5321 Procedure and treatment not carried out due to patient leaving prior to being seen by health care provider: Secondary | ICD-10-CM | POA: Diagnosis not present

## 2018-07-23 DIAGNOSIS — R531 Weakness: Secondary | ICD-10-CM | POA: Insufficient documentation

## 2018-07-23 DIAGNOSIS — R262 Difficulty in walking, not elsewhere classified: Secondary | ICD-10-CM | POA: Diagnosis not present

## 2018-07-23 DIAGNOSIS — R29818 Other symptoms and signs involving the nervous system: Secondary | ICD-10-CM | POA: Insufficient documentation

## 2018-07-23 LAB — DIFFERENTIAL
Abs Immature Granulocytes: 0.01 10*3/uL (ref 0.00–0.07)
Basophils Absolute: 0 10*3/uL (ref 0.0–0.1)
Basophils Relative: 0 %
Eosinophils Absolute: 0.3 10*3/uL (ref 0.0–0.5)
Eosinophils Relative: 5 %
Immature Granulocytes: 0 %
Lymphocytes Relative: 27 %
Lymphs Abs: 1.6 10*3/uL (ref 0.7–4.0)
Monocytes Absolute: 0.6 10*3/uL (ref 0.1–1.0)
Monocytes Relative: 10 %
Neutro Abs: 3.2 10*3/uL (ref 1.7–7.7)
Neutrophils Relative %: 58 %

## 2018-07-23 LAB — COMPREHENSIVE METABOLIC PANEL
ALT: 13 U/L (ref 0–44)
AST: 20 U/L (ref 15–41)
Albumin: 4 g/dL (ref 3.5–5.0)
Alkaline Phosphatase: 52 U/L (ref 38–126)
Anion gap: 8 (ref 5–15)
BUN: 30 mg/dL — ABNORMAL HIGH (ref 8–23)
CO2: 20 mmol/L — ABNORMAL LOW (ref 22–32)
Calcium: 9.4 mg/dL (ref 8.9–10.3)
Chloride: 108 mmol/L (ref 98–111)
Creatinine, Ser: 1.54 mg/dL — ABNORMAL HIGH (ref 0.44–1.00)
GFR calc Af Amer: 38 mL/min — ABNORMAL LOW (ref >60)
GFR calc non Af Amer: 33 mL/min — ABNORMAL LOW (ref >60)
Glucose, Bld: 91 mg/dL (ref 70–99)
Potassium: 4.7 mmol/L (ref 3.5–5.1)
Sodium: 136 mmol/L (ref 135–145)
Total Bilirubin: 0.7 mg/dL (ref 0.3–1.2)
Total Protein: 7.4 g/dL (ref 6.5–8.1)

## 2018-07-23 LAB — CBC
HCT: 35.7 % — ABNORMAL LOW (ref 36.0–46.0)
Hemoglobin: 11.1 g/dL — ABNORMAL LOW (ref 12.0–15.0)
MCH: 31.6 pg (ref 26.0–34.0)
MCHC: 31.1 g/dL (ref 30.0–36.0)
MCV: 101.7 fL — ABNORMAL HIGH (ref 80.0–100.0)
Platelets: 222 10*3/uL (ref 150–400)
RBC: 3.51 MIL/uL — ABNORMAL LOW (ref 3.87–5.11)
RDW: 16.6 % — ABNORMAL HIGH (ref 11.5–15.5)
WBC: 5.7 10*3/uL (ref 4.0–10.5)
nRBC: 0.4 % — ABNORMAL HIGH (ref 0.0–0.2)

## 2018-07-23 LAB — PROTIME-INR
INR: 1.1 (ref 0.8–1.2)
Prothrombin Time: 13.8 seconds (ref 11.4–15.2)

## 2018-07-23 LAB — APTT: aPTT: 32 seconds (ref 24–36)

## 2018-07-23 MED ORDER — SODIUM CHLORIDE 0.9% FLUSH
3.0000 mL | Freq: Once | INTRAVENOUS | Status: DC
Start: 2018-07-23 — End: 2018-07-24

## 2018-07-23 NOTE — ED Triage Notes (Signed)
Pt sent by her PCP for further assessment after c/o feeling like her legs were "sticky" she states that it sometimes feels like she is "stuck in sand" and happened 2 months ago as well. Pt denies any complaints at this time. Neuro exam negative

## 2018-07-26 DIAGNOSIS — G40909 Epilepsy, unspecified, not intractable, without status epilepticus: Secondary | ICD-10-CM | POA: Diagnosis not present

## 2018-07-26 DIAGNOSIS — E46 Unspecified protein-calorie malnutrition: Secondary | ICD-10-CM | POA: Diagnosis not present

## 2018-07-26 DIAGNOSIS — M109 Gout, unspecified: Secondary | ICD-10-CM | POA: Diagnosis not present

## 2018-07-26 DIAGNOSIS — R29898 Other symptoms and signs involving the musculoskeletal system: Secondary | ICD-10-CM | POA: Diagnosis not present

## 2018-07-26 DIAGNOSIS — Z1211 Encounter for screening for malignant neoplasm of colon: Secondary | ICD-10-CM | POA: Diagnosis not present

## 2018-07-26 DIAGNOSIS — I129 Hypertensive chronic kidney disease with stage 1 through stage 4 chronic kidney disease, or unspecified chronic kidney disease: Secondary | ICD-10-CM | POA: Diagnosis not present

## 2018-07-27 LAB — I-STAT CHEM 8, ED
BUN: 31 mg/dL — ABNORMAL HIGH (ref 8–23)
Calcium, Ion: 1.18 mmol/L (ref 1.15–1.40)
Chloride: 110 mmol/L (ref 98–111)
Creatinine, Ser: 1.5 mg/dL — ABNORMAL HIGH (ref 0.44–1.00)
Glucose, Bld: 88 mg/dL (ref 70–99)
HCT: 35 % — ABNORMAL LOW (ref 36.0–46.0)
Hemoglobin: 11.9 g/dL — ABNORMAL LOW (ref 12.0–15.0)
Potassium: 4.7 mmol/L (ref 3.5–5.1)
Sodium: 138 mmol/L (ref 135–145)
TCO2: 25 mmol/L (ref 22–32)

## 2018-08-06 ENCOUNTER — Other Ambulatory Visit: Payer: Self-pay | Admitting: Neurology

## 2018-08-21 ENCOUNTER — Other Ambulatory Visit: Payer: Self-pay | Admitting: Adult Health

## 2018-09-27 ENCOUNTER — Encounter: Payer: Self-pay | Admitting: Adult Health

## 2018-09-27 ENCOUNTER — Ambulatory Visit (INDEPENDENT_AMBULATORY_CARE_PROVIDER_SITE_OTHER): Payer: Medicare HMO | Admitting: Adult Health

## 2018-09-27 ENCOUNTER — Other Ambulatory Visit: Payer: Self-pay

## 2018-09-27 VITALS — BP 180/104 | HR 70 | Temp 97.1°F | Ht 61.0 in | Wt 77.6 lb

## 2018-09-27 DIAGNOSIS — R569 Unspecified convulsions: Secondary | ICD-10-CM | POA: Diagnosis not present

## 2018-09-27 NOTE — Patient Instructions (Signed)
Your Plan:  Continue Keppra and Vimpat  If your symptoms worsen or you develop new symptoms please let us know.   Thank you for coming to see [place] at [facility]. I hope we have been able to provide you high quality care today.  You may receive a patient satisfaction survey over the next few weeks. We would appreciate your feedback and comments so that we may continue to improve ourselves and the health of our patients.

## 2018-09-27 NOTE — Progress Notes (Signed)
PATIENT: [name] DOB: 07-Jun-1940  REASON FOR VISIT: follow up HISTORY FROM: patient  HISTORY OF PRESENT ILLNESS: Today 09/27/18:  [name] is a 78 year old female with a history of seizures.  She returns today for follow-up.  She remains on Keppra 500 mg daily and Vimpat 100 mg twice a day.  She reports that she has been tolerating these medications well.  She does not have any seizure events.  Her blood pressure is elevated today.  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  She denies any new symptoms.  She returns today for evaluation.  HISTORY 03/06/18  [name] is a 77 year old female who presents for follow-up for seizure.  She is currently taking Keppra 500 mg daily and Vimpat 100 mg twice a day. She denies any seizures since she was in the hospital in June 2019.  She hasn't been driving for 6 months. She lives alone. She does her own ADLs and manages her own medications. She is currently working with her kidney doctor to get good control of her BP.  She presents today for follow-up with her friend [name].  She denies any problems or concerns.  She reports that she has been taking her seizure medications as prescribed.  REVIEW OF SYSTEMS: Out of a complete 14 system review of symptoms, the patient complains only of the following symptoms, and all other reviewed systems are negative.  See HPI  ALLERGIES: No Known Allergies  HOME MEDICATIONS: Outpatient Medications Prior to Visit  Medication Sig Dispense Refill  . allopurinol (ZYLOPRIM) 100 MG tablet Take 200 mg by mouth daily as needed (gout).   5  . aspirin 81 MG EC tablet Take 81 mg by mouth every morning.     . carvedilol (COREG) 6.25 MG tablet Take 1 tablet (6.25 mg total) by mouth 2 (two) times daily with a meal. (Patient taking differently: Take 12.5 mg by mouth 2 (two) times daily with a meal. ) 60 tablet 3  . furosemide (LASIX) 20 MG tablet Take 1 tablet (20 mg total) by mouth daily. 30 tablet 1  .  Lacosamide (VIMPAT) 100 MG TABS Take 1 tablet by mouth twice a day as directed by physician. 180 tablet 1  . levETIRAcetam (KEPPRA) 500 MG tablet TAKE ONE-HALF TABLET BY MOUTH TWICE DAILY 90 tablet 3  . losartan (COZAAR) 25 MG tablet Take 25 mg by mouth daily.  5   No facility-administered medications prior to visit.     PAST MEDICAL HISTORY: Past Medical History:  Diagnosis Date  . Congestive heart failure (CHF) (HCC)   . Epilepsy (HCC)   . Hypertension   . Seizures (HCC)   . Smoker   . Stroke [facility])     PAST SURGICAL HISTORY: Past Surgical History:  Procedure Laterality Date  . NO PAST SURGERIES      FAMILY HISTORY: Family History  Problem Relation Age of Onset  . Cancer Father   . Anemia Daughter     SOCIAL HISTORY: Social History   Socioeconomic History  . Marital status: Single    Spouse name: Not on file  . Number of children: 3  . Years of education: 38  . Highest education level: Not on file  Occupational History    Comment: Retired  Engineer, production  . Financial resource strain: Not on file  . Food insecurity    Worry: Not on file    Inability: Not on file  . Transportation needs    Medical: Not on file  Non-medical: Not on file  Tobacco Use  . Smoking status: Current Some Day Smoker    Packs/day: 0.25    Types: Cigarettes  . Smokeless tobacco: Never Used  . Tobacco comment: 1/2 caigarettes a day  Substance and Sexual Activity  . Alcohol use: Yes    Alcohol/week: 0.0 standard drinks    Comment: occasional  . Drug use: No  . Sexual activity: Never  Lifestyle  . Physical activity    Days per week: Not on file    Minutes per session: Not on file  . Stress: Not on file  Relationships  . Social Herbalist on phone: Not on file    Gets together: Not on file    Attends religious service: Not on file    Active member of club or organization: Not on file    Attends meetings of clubs or organizations: Not on file    Relationship status:  Not on file  . Intimate partner violence    Fear of current or ex partner: Not on file    Emotionally abused: Not on file    Physically abused: Not on file    Forced sexual activity: Not on file  Other Topics Concern  . Not on file  Social History Narrative   Patient lives at home alone.    Retired.   Education one year of college.   Left handed.   Caffeine one cup of coffee daily and one cup of tea.      PHYSICAL EXAM  Vitals:   09/27/18 1353 09/27/18 1532  BP: (!) 176/104 (!) 180/104  Pulse: 70   Temp: (!) 97.1 F (36.2 C)   Weight: 77 lb 9.6 oz (35.2 kg)   Height: 5\' 1"  (1.549 m)    Body mass index is 14.66 kg/m.  Generalized: Well developed, in no acute distress   Neurological examination  Mentation: Alert oriented to time, place, history taking. Follows all commands speech and language fluent Cranial nerve II-XII: Pupils were equal round reactive to light. Extraocular movements were full, visual field were full on confrontational test.  Head turning and shoulder shrug  were normal and symmetric. Motor: The motor testing reveals 5 over 5 strength of all 4 extremities. Good symmetric motor tone is noted throughout.  Sensory: Sensory testing is intact to soft touch on all 4 extremities. No evidence of extinction is noted.  Coordination: Cerebellar testing reveals good finger-nose-finger and heel-to-shin bilaterally.  Gait and station: Gait is normal.   DIAGNOSTIC DATA (LABS, IMAGING, TESTING) - I reviewed patient records, labs, notes, testing and imaging myself where available.  Lab Results  Component Value Date   WBC 5.7 07/23/2018   HGB 11.9 (L) 07/23/2018   HCT 35.0 (L) 07/23/2018   MCV 101.7 (H) 07/23/2018   PLT 222 07/23/2018      Component Value Date/Time   NA 138 07/23/2018 1719   NA 135 (A) 05/23/2015   K 4.7 07/23/2018 1719   CL 110 07/23/2018 1719   CO2 20 (L) 07/23/2018 1657   GLUCOSE 88 07/23/2018 1719   BUN 31 (H) 07/23/2018 1719   BUN 40  (A) 05/23/2015   CREATININE 1.50 (H) 07/23/2018 1719   CREATININE 1.42 (H) 06/04/2015 1515   CALCIUM 9.4 07/23/2018 1657   PROT 7.4 07/23/2018 1657   ALBUMIN 4.0 07/23/2018 1657   AST 20 07/23/2018 1657   ALT 13 07/23/2018 1657   ALKPHOS 52 07/23/2018 1657   BILITOT 0.7 07/23/2018 1657  GFRNONAA 33 (L) 07/23/2018 1657   GFRAA 38 (L) 07/23/2018 1657   Lab Results  Component Value Date   CHOL 175 03/06/2014   HDL 68 03/06/2014   LDLCALC 92 03/06/2014   TRIG 73 03/06/2014   CHOLHDL 2.6 03/06/2014   Lab Results  Component Value Date   HGBA1C 5.8 (H) 03/06/2014   Lab Results  Component Value Date   VITAMINB12 516 05/16/2015   Lab Results  Component Value Date   TSH 1.765 05/16/2015      ASSESSMENT AND PLAN 78 y.o. year old female  has a past medical history of Congestive heart failure (CHF) (HCC), Epilepsy (HCC), Hypertension, Seizures (HCC), Smoker, and Stroke (HCC). here with:  1.  Seizures  Overall the patient is doing well.  She will continue on Keppra and Vimpat.  I have advised that if she has any seizure event she should let us know.  The patient's blood pressure is elevated today.  She reports that she did take her medication this morning.  I have advised that she should continue monitoring her blood pressure.  If it remains elevated she should make her PCP aware.  She will follow-up in 1 year or sooner if needed.   I spent 15 minutes with the patient. 50% of this time was spent reviewing medication and seizure precautions   Butch PennyMegan Yuan Gann, MSN, NP-C 09/27/2018, 2:10 PM [facility] [address], [address] [place], [place] [address] [phone]

## 2018-09-28 NOTE — Progress Notes (Signed)
I agree with the above plan 

## 2018-10-15 DIAGNOSIS — N2581 Secondary hyperparathyroidism of renal origin: Secondary | ICD-10-CM | POA: Diagnosis not present

## 2018-10-15 DIAGNOSIS — F17200 Nicotine dependence, unspecified, uncomplicated: Secondary | ICD-10-CM | POA: Diagnosis not present

## 2018-10-15 DIAGNOSIS — G40909 Epilepsy, unspecified, not intractable, without status epilepticus: Secondary | ICD-10-CM | POA: Diagnosis not present

## 2018-10-15 DIAGNOSIS — I509 Heart failure, unspecified: Secondary | ICD-10-CM | POA: Diagnosis not present

## 2018-10-15 DIAGNOSIS — D631 Anemia in chronic kidney disease: Secondary | ICD-10-CM | POA: Diagnosis not present

## 2018-10-15 DIAGNOSIS — I129 Hypertensive chronic kidney disease with stage 1 through stage 4 chronic kidney disease, or unspecified chronic kidney disease: Secondary | ICD-10-CM | POA: Diagnosis not present

## 2018-10-15 DIAGNOSIS — N183 Chronic kidney disease, stage 3 (moderate): Secondary | ICD-10-CM | POA: Diagnosis not present

## 2018-10-15 DIAGNOSIS — I639 Cerebral infarction, unspecified: Secondary | ICD-10-CM | POA: Diagnosis not present

## 2018-10-15 DIAGNOSIS — N189 Chronic kidney disease, unspecified: Secondary | ICD-10-CM | POA: Diagnosis not present

## 2018-10-15 DIAGNOSIS — M109 Gout, unspecified: Secondary | ICD-10-CM | POA: Diagnosis not present

## 2018-12-13 DIAGNOSIS — M810 Age-related osteoporosis without current pathological fracture: Secondary | ICD-10-CM | POA: Diagnosis not present

## 2018-12-13 DIAGNOSIS — N183 Chronic kidney disease, stage 3 unspecified: Secondary | ICD-10-CM | POA: Diagnosis not present

## 2018-12-13 DIAGNOSIS — I129 Hypertensive chronic kidney disease with stage 1 through stage 4 chronic kidney disease, or unspecified chronic kidney disease: Secondary | ICD-10-CM | POA: Diagnosis not present

## 2018-12-13 DIAGNOSIS — I639 Cerebral infarction, unspecified: Secondary | ICD-10-CM | POA: Diagnosis not present

## 2018-12-13 DIAGNOSIS — D509 Iron deficiency anemia, unspecified: Secondary | ICD-10-CM | POA: Diagnosis not present

## 2019-01-03 DIAGNOSIS — R1032 Left lower quadrant pain: Secondary | ICD-10-CM | POA: Diagnosis not present

## 2019-01-03 DIAGNOSIS — M109 Gout, unspecified: Secondary | ICD-10-CM | POA: Diagnosis not present

## 2019-01-03 DIAGNOSIS — N183 Chronic kidney disease, stage 3 unspecified: Secondary | ICD-10-CM | POA: Diagnosis not present

## 2019-01-03 DIAGNOSIS — I129 Hypertensive chronic kidney disease with stage 1 through stage 4 chronic kidney disease, or unspecified chronic kidney disease: Secondary | ICD-10-CM | POA: Diagnosis not present

## 2019-01-03 DIAGNOSIS — Z23 Encounter for immunization: Secondary | ICD-10-CM | POA: Diagnosis not present

## 2019-01-03 DIAGNOSIS — M810 Age-related osteoporosis without current pathological fracture: Secondary | ICD-10-CM | POA: Diagnosis not present

## 2019-01-03 DIAGNOSIS — E46 Unspecified protein-calorie malnutrition: Secondary | ICD-10-CM | POA: Diagnosis not present

## 2019-01-03 DIAGNOSIS — G40909 Epilepsy, unspecified, not intractable, without status epilepticus: Secondary | ICD-10-CM | POA: Diagnosis not present

## 2019-02-20 DIAGNOSIS — G40909 Epilepsy, unspecified, not intractable, without status epilepticus: Secondary | ICD-10-CM | POA: Diagnosis not present

## 2019-02-20 DIAGNOSIS — I129 Hypertensive chronic kidney disease with stage 1 through stage 4 chronic kidney disease, or unspecified chronic kidney disease: Secondary | ICD-10-CM | POA: Diagnosis not present

## 2019-02-20 DIAGNOSIS — N183 Chronic kidney disease, stage 3 unspecified: Secondary | ICD-10-CM | POA: Diagnosis not present

## 2019-03-06 ENCOUNTER — Other Ambulatory Visit: Payer: Self-pay | Admitting: Neurology

## 2019-03-06 NOTE — Telephone Encounter (Signed)
Dr. Pearlean Brownie and Katrina , It's time for patient's Renewal .  Dr. Pearlean Brownie you will have to print RX .  Vimpat  This can wait for Dr. Pearlean Brownie to Return . Thanks Annabelle Harman.

## 2019-03-13 ENCOUNTER — Other Ambulatory Visit: Payer: Self-pay

## 2019-03-13 MED ORDER — VIMPAT 100 MG PO TABS: ORAL_TABLET | ORAL | 1 refills | Status: DC

## 2019-03-13 NOTE — Telephone Encounter (Signed)
Vimpat prescription signed by Dr.Sethi and put on [facility] desk for review for pt assistance.

## 2019-03-14 DIAGNOSIS — M810 Age-related osteoporosis without current pathological fracture: Secondary | ICD-10-CM | POA: Diagnosis not present

## 2019-03-14 DIAGNOSIS — D509 Iron deficiency anemia, unspecified: Secondary | ICD-10-CM | POA: Diagnosis not present

## 2019-03-14 DIAGNOSIS — I129 Hypertensive chronic kidney disease with stage 1 through stage 4 chronic kidney disease, or unspecified chronic kidney disease: Secondary | ICD-10-CM | POA: Diagnosis not present

## 2019-03-14 DIAGNOSIS — I639 Cerebral infarction, unspecified: Secondary | ICD-10-CM | POA: Diagnosis not present

## 2019-03-14 DIAGNOSIS — N183 Chronic kidney disease, stage 3 unspecified: Secondary | ICD-10-CM | POA: Diagnosis not present

## 2019-03-14 NOTE — Telephone Encounter (Signed)
Pt called wanting to know the update on this medication. Please advise. °

## 2019-03-18 NOTE — Telephone Encounter (Signed)
I will call and check status in am 03/18/2019 in am Thanks Annabelle Harman

## 2019-03-21 ENCOUNTER — Other Ambulatory Visit: Payer: Self-pay | Admitting: Neurology

## 2019-03-21 NOTE — Telephone Encounter (Signed)
I called and left a message, the patient called about her Vimpat prescription, looks like it was called in last week on 13 March 2019, if the pharmacy does not have the prescription, she is to call me back and I will send in the prescription.

## 2019-03-22 NOTE — Telephone Encounter (Signed)
I called the patient, I informed her that the prescription was called in for Vimpat on 13 March 2019 at [facility].  The patient was expecting the medication to come through mail order.

## 2019-03-22 NOTE — Telephone Encounter (Signed)
Patient returned missed call in regards to her prescription please FU

## 2019-03-25 ENCOUNTER — Telehealth: Payer: Self-pay | Admitting: Adult Health

## 2019-03-25 NOTE — Telephone Encounter (Signed)
Patient gets patient assistance.[name] has paper prescription and application and is processing.

## 2019-03-25 NOTE — Telephone Encounter (Signed)
Katrina Charity fundraiser - Printed me a new RX for Vimpat. Fax has been sent [phone] . [employer] Patient assistance.

## 2019-03-25 NOTE — Telephone Encounter (Signed)
Pt called and left VM wanting to speak to RN about her Vimpat. Please advise.

## 2019-03-25 NOTE — Telephone Encounter (Signed)
Caled and checked the status on patient assistance [employer] and its till Pending . Katrina Can you please give her samples to last her for One week Please. Samples are in the closet we can give her. Thanks Dana   Lacosamide (VIMPAT) 100 MG TABS 180 tablet 1 03/13/2019    Sig: Take 1 tablet by mouth twice a day as directed by physician.   Class: Print   Notes to Pharmacy: RX Request

## 2019-03-26 MED ORDER — VIMPAT 100 MG PO TABS: 100.0000 mg | ORAL_TABLET | Freq: Two times a day (BID) | ORAL | 0 refills | Status: DC

## 2019-03-26 NOTE — Telephone Encounter (Signed)
Prescription was done and given to [facility].

## 2019-03-26 NOTE — Telephone Encounter (Signed)
See other phone note about vimpat samples and PAP.

## 2019-03-26 NOTE — Telephone Encounter (Signed)
Pt here for samples of vimpat 100mg  po BID # 14 (2 boxes) LOT exp 03-2021.  Given to pt.  PAP pending per 10-01-1998.

## 2019-03-26 NOTE — Telephone Encounter (Signed)
I called pt and LMVM for her to return call about her vimpat.

## 2019-03-26 NOTE — Telephone Encounter (Signed)
Pt was given samples today by Hermenia Fiscal RN for two weeks until mail order comes in.

## 2019-03-26 NOTE — Addendum Note (Signed)
Addended by: Guy Begin on: 03/26/2019 01:24 PM   Modules accepted: Orders

## 2019-04-01 DIAGNOSIS — I129 Hypertensive chronic kidney disease with stage 1 through stage 4 chronic kidney disease, or unspecified chronic kidney disease: Secondary | ICD-10-CM | POA: Diagnosis not present

## 2019-04-01 DIAGNOSIS — M545 Low back pain: Secondary | ICD-10-CM | POA: Diagnosis not present

## 2019-04-01 DIAGNOSIS — G8929 Other chronic pain: Secondary | ICD-10-CM | POA: Diagnosis not present

## 2019-04-01 DIAGNOSIS — R300 Dysuria: Secondary | ICD-10-CM | POA: Diagnosis not present

## 2019-04-03 NOTE — Telephone Encounter (Signed)
Patient was approved for Vimpat [employer] patient assistance program. ID # [id number] RX will delivered to patient's home.  Vimpat . Patient is aware of details. ID number [id number] 04/01/2019 -01-31-2020. Thanks Annabelle Harman.

## 2019-06-19 DIAGNOSIS — N183 Chronic kidney disease, stage 3 unspecified: Secondary | ICD-10-CM | POA: Diagnosis not present

## 2019-06-19 DIAGNOSIS — E46 Unspecified protein-calorie malnutrition: Secondary | ICD-10-CM | POA: Diagnosis not present

## 2019-06-19 DIAGNOSIS — I10 Essential (primary) hypertension: Secondary | ICD-10-CM | POA: Diagnosis not present

## 2019-06-20 DIAGNOSIS — I639 Cerebral infarction, unspecified: Secondary | ICD-10-CM | POA: Diagnosis not present

## 2019-06-20 DIAGNOSIS — M810 Age-related osteoporosis without current pathological fracture: Secondary | ICD-10-CM | POA: Diagnosis not present

## 2019-06-20 DIAGNOSIS — F0390 Unspecified dementia without behavioral disturbance: Secondary | ICD-10-CM | POA: Diagnosis not present

## 2019-06-20 DIAGNOSIS — I10 Essential (primary) hypertension: Secondary | ICD-10-CM | POA: Diagnosis not present

## 2019-06-20 DIAGNOSIS — I129 Hypertensive chronic kidney disease with stage 1 through stage 4 chronic kidney disease, or unspecified chronic kidney disease: Secondary | ICD-10-CM | POA: Diagnosis not present

## 2019-06-20 DIAGNOSIS — D509 Iron deficiency anemia, unspecified: Secondary | ICD-10-CM | POA: Diagnosis not present

## 2019-06-20 DIAGNOSIS — N183 Chronic kidney disease, stage 3 unspecified: Secondary | ICD-10-CM | POA: Diagnosis not present

## 2019-07-02 DIAGNOSIS — N183 Chronic kidney disease, stage 3 unspecified: Secondary | ICD-10-CM | POA: Diagnosis not present

## 2019-07-02 DIAGNOSIS — E46 Unspecified protein-calorie malnutrition: Secondary | ICD-10-CM | POA: Diagnosis not present

## 2019-07-02 DIAGNOSIS — Z0000 Encounter for general adult medical examination without abnormal findings: Secondary | ICD-10-CM | POA: Diagnosis not present

## 2019-07-02 DIAGNOSIS — M109 Gout, unspecified: Secondary | ICD-10-CM | POA: Diagnosis not present

## 2019-07-02 DIAGNOSIS — I129 Hypertensive chronic kidney disease with stage 1 through stage 4 chronic kidney disease, or unspecified chronic kidney disease: Secondary | ICD-10-CM | POA: Diagnosis not present

## 2019-07-02 DIAGNOSIS — G40909 Epilepsy, unspecified, not intractable, without status epilepticus: Secondary | ICD-10-CM | POA: Diagnosis not present

## 2019-07-02 DIAGNOSIS — F17200 Nicotine dependence, unspecified, uncomplicated: Secondary | ICD-10-CM | POA: Diagnosis not present

## 2019-07-02 DIAGNOSIS — D509 Iron deficiency anemia, unspecified: Secondary | ICD-10-CM | POA: Diagnosis not present

## 2019-07-02 DIAGNOSIS — M810 Age-related osteoporosis without current pathological fracture: Secondary | ICD-10-CM | POA: Diagnosis not present

## 2019-07-02 DIAGNOSIS — R1032 Left lower quadrant pain: Secondary | ICD-10-CM | POA: Diagnosis not present

## 2019-07-03 ENCOUNTER — Other Ambulatory Visit: Payer: Self-pay | Admitting: Family Medicine

## 2019-07-03 DIAGNOSIS — M810 Age-related osteoporosis without current pathological fracture: Secondary | ICD-10-CM

## 2019-07-05 DIAGNOSIS — G40909 Epilepsy, unspecified, not intractable, without status epilepticus: Secondary | ICD-10-CM | POA: Diagnosis not present

## 2019-07-05 DIAGNOSIS — M810 Age-related osteoporosis without current pathological fracture: Secondary | ICD-10-CM | POA: Diagnosis not present

## 2019-07-05 DIAGNOSIS — F17200 Nicotine dependence, unspecified, uncomplicated: Secondary | ICD-10-CM | POA: Diagnosis not present

## 2019-07-05 DIAGNOSIS — Z7982 Long term (current) use of aspirin: Secondary | ICD-10-CM | POA: Diagnosis not present

## 2019-07-05 DIAGNOSIS — D509 Iron deficiency anemia, unspecified: Secondary | ICD-10-CM | POA: Diagnosis not present

## 2019-07-05 DIAGNOSIS — N183 Chronic kidney disease, stage 3 unspecified: Secondary | ICD-10-CM | POA: Diagnosis not present

## 2019-07-05 DIAGNOSIS — E46 Unspecified protein-calorie malnutrition: Secondary | ICD-10-CM | POA: Diagnosis not present

## 2019-07-05 DIAGNOSIS — Z8673 Personal history of transient ischemic attack (TIA), and cerebral infarction without residual deficits: Secondary | ICD-10-CM | POA: Diagnosis not present

## 2019-07-05 DIAGNOSIS — I129 Hypertensive chronic kidney disease with stage 1 through stage 4 chronic kidney disease, or unspecified chronic kidney disease: Secondary | ICD-10-CM | POA: Diagnosis not present

## 2019-07-05 DIAGNOSIS — M109 Gout, unspecified: Secondary | ICD-10-CM | POA: Diagnosis not present

## 2019-07-09 DIAGNOSIS — M810 Age-related osteoporosis without current pathological fracture: Secondary | ICD-10-CM | POA: Diagnosis not present

## 2019-07-09 DIAGNOSIS — G40909 Epilepsy, unspecified, not intractable, without status epilepticus: Secondary | ICD-10-CM | POA: Diagnosis not present

## 2019-07-09 DIAGNOSIS — I129 Hypertensive chronic kidney disease with stage 1 through stage 4 chronic kidney disease, or unspecified chronic kidney disease: Secondary | ICD-10-CM | POA: Diagnosis not present

## 2019-07-09 DIAGNOSIS — N183 Chronic kidney disease, stage 3 unspecified: Secondary | ICD-10-CM | POA: Diagnosis not present

## 2019-07-09 DIAGNOSIS — D509 Iron deficiency anemia, unspecified: Secondary | ICD-10-CM | POA: Diagnosis not present

## 2019-07-09 DIAGNOSIS — E46 Unspecified protein-calorie malnutrition: Secondary | ICD-10-CM | POA: Diagnosis not present

## 2019-07-09 DIAGNOSIS — M109 Gout, unspecified: Secondary | ICD-10-CM | POA: Diagnosis not present

## 2019-07-09 DIAGNOSIS — Z7982 Long term (current) use of aspirin: Secondary | ICD-10-CM | POA: Diagnosis not present

## 2019-07-09 DIAGNOSIS — F17200 Nicotine dependence, unspecified, uncomplicated: Secondary | ICD-10-CM | POA: Diagnosis not present

## 2019-07-15 DIAGNOSIS — N183 Chronic kidney disease, stage 3 unspecified: Secondary | ICD-10-CM | POA: Diagnosis not present

## 2019-07-15 DIAGNOSIS — E46 Unspecified protein-calorie malnutrition: Secondary | ICD-10-CM | POA: Diagnosis not present

## 2019-07-15 DIAGNOSIS — M810 Age-related osteoporosis without current pathological fracture: Secondary | ICD-10-CM | POA: Diagnosis not present

## 2019-07-15 DIAGNOSIS — I129 Hypertensive chronic kidney disease with stage 1 through stage 4 chronic kidney disease, or unspecified chronic kidney disease: Secondary | ICD-10-CM | POA: Diagnosis not present

## 2019-07-15 DIAGNOSIS — M109 Gout, unspecified: Secondary | ICD-10-CM | POA: Diagnosis not present

## 2019-07-15 DIAGNOSIS — F17200 Nicotine dependence, unspecified, uncomplicated: Secondary | ICD-10-CM | POA: Diagnosis not present

## 2019-07-15 DIAGNOSIS — D509 Iron deficiency anemia, unspecified: Secondary | ICD-10-CM | POA: Diagnosis not present

## 2019-07-15 DIAGNOSIS — G40909 Epilepsy, unspecified, not intractable, without status epilepticus: Secondary | ICD-10-CM | POA: Diagnosis not present

## 2019-07-15 DIAGNOSIS — Z7982 Long term (current) use of aspirin: Secondary | ICD-10-CM | POA: Diagnosis not present

## 2019-07-18 DIAGNOSIS — D509 Iron deficiency anemia, unspecified: Secondary | ICD-10-CM | POA: Diagnosis not present

## 2019-07-18 DIAGNOSIS — I129 Hypertensive chronic kidney disease with stage 1 through stage 4 chronic kidney disease, or unspecified chronic kidney disease: Secondary | ICD-10-CM | POA: Diagnosis not present

## 2019-07-18 DIAGNOSIS — M810 Age-related osteoporosis without current pathological fracture: Secondary | ICD-10-CM | POA: Diagnosis not present

## 2019-07-18 DIAGNOSIS — E46 Unspecified protein-calorie malnutrition: Secondary | ICD-10-CM | POA: Diagnosis not present

## 2019-07-18 DIAGNOSIS — M109 Gout, unspecified: Secondary | ICD-10-CM | POA: Diagnosis not present

## 2019-07-18 DIAGNOSIS — G40909 Epilepsy, unspecified, not intractable, without status epilepticus: Secondary | ICD-10-CM | POA: Diagnosis not present

## 2019-07-18 DIAGNOSIS — F17200 Nicotine dependence, unspecified, uncomplicated: Secondary | ICD-10-CM | POA: Diagnosis not present

## 2019-07-18 DIAGNOSIS — Z7982 Long term (current) use of aspirin: Secondary | ICD-10-CM | POA: Diagnosis not present

## 2019-07-18 DIAGNOSIS — N183 Chronic kidney disease, stage 3 unspecified: Secondary | ICD-10-CM | POA: Diagnosis not present

## 2019-07-19 DIAGNOSIS — F0390 Unspecified dementia without behavioral disturbance: Secondary | ICD-10-CM | POA: Diagnosis not present

## 2019-07-19 DIAGNOSIS — E46 Unspecified protein-calorie malnutrition: Secondary | ICD-10-CM | POA: Diagnosis not present

## 2019-07-19 DIAGNOSIS — I10 Essential (primary) hypertension: Secondary | ICD-10-CM | POA: Diagnosis not present

## 2019-07-22 DIAGNOSIS — I129 Hypertensive chronic kidney disease with stage 1 through stage 4 chronic kidney disease, or unspecified chronic kidney disease: Secondary | ICD-10-CM | POA: Diagnosis not present

## 2019-07-22 DIAGNOSIS — D509 Iron deficiency anemia, unspecified: Secondary | ICD-10-CM | POA: Diagnosis not present

## 2019-07-22 DIAGNOSIS — N183 Chronic kidney disease, stage 3 unspecified: Secondary | ICD-10-CM | POA: Diagnosis not present

## 2019-07-22 DIAGNOSIS — E46 Unspecified protein-calorie malnutrition: Secondary | ICD-10-CM | POA: Diagnosis not present

## 2019-07-22 DIAGNOSIS — M109 Gout, unspecified: Secondary | ICD-10-CM | POA: Diagnosis not present

## 2019-07-22 DIAGNOSIS — M810 Age-related osteoporosis without current pathological fracture: Secondary | ICD-10-CM | POA: Diagnosis not present

## 2019-07-22 DIAGNOSIS — G40909 Epilepsy, unspecified, not intractable, without status epilepticus: Secondary | ICD-10-CM | POA: Diagnosis not present

## 2019-07-22 DIAGNOSIS — F17200 Nicotine dependence, unspecified, uncomplicated: Secondary | ICD-10-CM | POA: Diagnosis not present

## 2019-07-22 DIAGNOSIS — Z7982 Long term (current) use of aspirin: Secondary | ICD-10-CM | POA: Diagnosis not present

## 2019-07-24 DIAGNOSIS — D509 Iron deficiency anemia, unspecified: Secondary | ICD-10-CM | POA: Diagnosis not present

## 2019-07-24 DIAGNOSIS — M810 Age-related osteoporosis without current pathological fracture: Secondary | ICD-10-CM | POA: Diagnosis not present

## 2019-07-24 DIAGNOSIS — G40909 Epilepsy, unspecified, not intractable, without status epilepticus: Secondary | ICD-10-CM | POA: Diagnosis not present

## 2019-07-24 DIAGNOSIS — N183 Chronic kidney disease, stage 3 unspecified: Secondary | ICD-10-CM | POA: Diagnosis not present

## 2019-07-24 DIAGNOSIS — F17200 Nicotine dependence, unspecified, uncomplicated: Secondary | ICD-10-CM | POA: Diagnosis not present

## 2019-07-24 DIAGNOSIS — I129 Hypertensive chronic kidney disease with stage 1 through stage 4 chronic kidney disease, or unspecified chronic kidney disease: Secondary | ICD-10-CM | POA: Diagnosis not present

## 2019-07-24 DIAGNOSIS — E46 Unspecified protein-calorie malnutrition: Secondary | ICD-10-CM | POA: Diagnosis not present

## 2019-07-24 DIAGNOSIS — M109 Gout, unspecified: Secondary | ICD-10-CM | POA: Diagnosis not present

## 2019-07-24 DIAGNOSIS — Z7982 Long term (current) use of aspirin: Secondary | ICD-10-CM | POA: Diagnosis not present

## 2019-07-25 DIAGNOSIS — G40909 Epilepsy, unspecified, not intractable, without status epilepticus: Secondary | ICD-10-CM | POA: Diagnosis not present

## 2019-07-25 DIAGNOSIS — I129 Hypertensive chronic kidney disease with stage 1 through stage 4 chronic kidney disease, or unspecified chronic kidney disease: Secondary | ICD-10-CM | POA: Diagnosis not present

## 2019-07-25 DIAGNOSIS — E46 Unspecified protein-calorie malnutrition: Secondary | ICD-10-CM | POA: Diagnosis not present

## 2019-07-25 DIAGNOSIS — M810 Age-related osteoporosis without current pathological fracture: Secondary | ICD-10-CM | POA: Diagnosis not present

## 2019-07-25 DIAGNOSIS — D509 Iron deficiency anemia, unspecified: Secondary | ICD-10-CM | POA: Diagnosis not present

## 2019-07-25 DIAGNOSIS — Z7982 Long term (current) use of aspirin: Secondary | ICD-10-CM | POA: Diagnosis not present

## 2019-07-25 DIAGNOSIS — M109 Gout, unspecified: Secondary | ICD-10-CM | POA: Diagnosis not present

## 2019-07-25 DIAGNOSIS — F17200 Nicotine dependence, unspecified, uncomplicated: Secondary | ICD-10-CM | POA: Diagnosis not present

## 2019-07-25 DIAGNOSIS — N183 Chronic kidney disease, stage 3 unspecified: Secondary | ICD-10-CM | POA: Diagnosis not present

## 2019-07-26 DIAGNOSIS — N183 Chronic kidney disease, stage 3 unspecified: Secondary | ICD-10-CM | POA: Diagnosis not present

## 2019-07-26 DIAGNOSIS — F0390 Unspecified dementia without behavioral disturbance: Secondary | ICD-10-CM | POA: Diagnosis not present

## 2019-07-26 DIAGNOSIS — M810 Age-related osteoporosis without current pathological fracture: Secondary | ICD-10-CM | POA: Diagnosis not present

## 2019-07-26 DIAGNOSIS — D509 Iron deficiency anemia, unspecified: Secondary | ICD-10-CM | POA: Diagnosis not present

## 2019-07-26 DIAGNOSIS — I129 Hypertensive chronic kidney disease with stage 1 through stage 4 chronic kidney disease, or unspecified chronic kidney disease: Secondary | ICD-10-CM | POA: Diagnosis not present

## 2019-07-26 DIAGNOSIS — I639 Cerebral infarction, unspecified: Secondary | ICD-10-CM | POA: Diagnosis not present

## 2019-07-26 DIAGNOSIS — I10 Essential (primary) hypertension: Secondary | ICD-10-CM | POA: Diagnosis not present

## 2019-07-29 DIAGNOSIS — G40909 Epilepsy, unspecified, not intractable, without status epilepticus: Secondary | ICD-10-CM | POA: Diagnosis not present

## 2019-07-29 DIAGNOSIS — N183 Chronic kidney disease, stage 3 unspecified: Secondary | ICD-10-CM | POA: Diagnosis not present

## 2019-07-29 DIAGNOSIS — I129 Hypertensive chronic kidney disease with stage 1 through stage 4 chronic kidney disease, or unspecified chronic kidney disease: Secondary | ICD-10-CM | POA: Diagnosis not present

## 2019-07-29 DIAGNOSIS — D509 Iron deficiency anemia, unspecified: Secondary | ICD-10-CM | POA: Diagnosis not present

## 2019-07-29 DIAGNOSIS — E46 Unspecified protein-calorie malnutrition: Secondary | ICD-10-CM | POA: Diagnosis not present

## 2019-07-29 DIAGNOSIS — Z7982 Long term (current) use of aspirin: Secondary | ICD-10-CM | POA: Diagnosis not present

## 2019-07-29 DIAGNOSIS — M810 Age-related osteoporosis without current pathological fracture: Secondary | ICD-10-CM | POA: Diagnosis not present

## 2019-07-29 DIAGNOSIS — M109 Gout, unspecified: Secondary | ICD-10-CM | POA: Diagnosis not present

## 2019-07-29 DIAGNOSIS — F17200 Nicotine dependence, unspecified, uncomplicated: Secondary | ICD-10-CM | POA: Diagnosis not present

## 2019-07-30 ENCOUNTER — Other Ambulatory Visit: Payer: Self-pay

## 2019-07-30 ENCOUNTER — Ambulatory Visit: Payer: Medicare HMO | Admitting: Podiatry

## 2019-07-30 ENCOUNTER — Encounter: Payer: Self-pay | Admitting: Podiatry

## 2019-07-30 VITALS — BP 135/72 | HR 74

## 2019-07-30 DIAGNOSIS — M2141 Flat foot [pes planus] (acquired), right foot: Secondary | ICD-10-CM | POA: Diagnosis not present

## 2019-07-30 DIAGNOSIS — M79674 Pain in right toe(s): Secondary | ICD-10-CM

## 2019-07-30 DIAGNOSIS — M79675 Pain in left toe(s): Secondary | ICD-10-CM

## 2019-07-30 DIAGNOSIS — B351 Tinea unguium: Secondary | ICD-10-CM

## 2019-07-30 DIAGNOSIS — M2142 Flat foot [pes planus] (acquired), left foot: Secondary | ICD-10-CM

## 2019-07-30 NOTE — Patient Instructions (Signed)
Diabetes Mellitus and Foot Care Foot care is an important part of your health, especially when you have diabetes. Diabetes may cause you to have problems because of poor blood flow (circulation) to your feet and legs, which can cause your skin to:  Become thinner and drier.  Break more easily.  Heal more slowly.  Peel and crack. You may also have nerve damage (neuropathy) in your legs and feet, causing decreased feeling in them. This means that you may not notice minor injuries to your feet that could lead to more serious problems. Noticing and addressing any potential problems early is the best way to prevent future foot problems. How to care for your feet Foot hygiene  Wash your feet daily with warm water and mild soap. Do not use hot water. Then, pat your feet and the areas between your toes until they are completely dry. Do not soak your feet as this can dry your skin.  Trim your toenails straight across. Do not dig under them or around the cuticle. File the edges of your nails with an emery board or nail file.  Apply a moisturizing lotion or petroleum jelly to the skin on your feet and to dry, brittle toenails. Use lotion that does not contain alcohol and is unscented. Do not apply lotion between your toes. Shoes and socks  Wear clean socks or stockings every day. Make sure they are not too tight. Do not wear knee-high stockings since they may decrease blood flow to your legs.  Wear shoes that fit properly and have enough cushioning. Always look in your shoes before you put them on to be sure there are no objects inside.  To break in new shoes, wear them for just a few hours a day. This prevents injuries on your feet. Wounds, scrapes, corns, and calluses  Check your feet daily for blisters, cuts, bruises, sores, and redness. If you cannot see the bottom of your feet, use a mirror or ask someone for help.  Do not cut corns or calluses or try to remove them with medicine.  If you  find a minor scrape, cut, or break in the skin on your feet, keep it and the skin around it clean and dry. You may clean these areas with mild soap and water. Do not clean the area with peroxide, alcohol, or iodine.  If you have a wound, scrape, corn, or callus on your foot, look at it several times a day to make sure it is healing and not infected. Check for: ? Redness, swelling, or pain. ? Fluid or blood. ? Warmth. ? Pus or a bad smell. General instructions  Do not cross your legs. This may decrease blood flow to your feet.  Do not use heating pads or hot water bottles on your feet. They may burn your skin. If you have lost feeling in your feet or legs, you may not know this is happening until it is too late.  Protect your feet from hot and cold by wearing shoes, such as at the beach or on hot pavement.  Schedule a complete foot exam at least once a year (annually) or more often if you have foot problems. If you have foot problems, report any cuts, sores, or bruises to your health care provider immediately. Contact a health care provider if:  You have a medical condition that increases your risk of infection and you have any cuts, sores, or bruises on your feet.  You have an injury that is not   healing.  You have redness on your legs or feet.  You feel burning or tingling in your legs or feet.  You have pain or cramps in your legs and feet.  Your legs or feet are numb.  Your feet always feel cold.  You have pain around a toenail. Get help right away if:  You have a wound, scrape, corn, or callus on your foot and: ? You have pain, swelling, or redness that gets worse. ? You have fluid or blood coming from the wound, scrape, corn, or callus. ? Your wound, scrape, corn, or callus feels warm to the touch. ? You have pus or a bad smell coming from the wound, scrape, corn, or callus. ? You have a fever. ? You have a red line going up your leg. Summary  Check your feet every day  for cuts, sores, red spots, swelling, and blisters.  Moisturize feet and legs daily.  Wear shoes that fit properly and have enough cushioning.  If you have foot problems, report any cuts, sores, or bruises to your health care provider immediately.  Schedule a complete foot exam at least once a year (annually) or more often if you have foot problems. This information is not intended to replace advice given to you by your health care provider. Make sure you discuss any questions you have with your health care provider. Document Revised: 10/10/2018 Document Reviewed: 02/19/2016 Elsevier Patient Education  2020 [employer] Inc[employer]  Onychomycosis/Fungal Toenails  WHAT IS IT? An infection that lies within the keratin of your nail plate that is caused by a fungus.  WHY ME? Fungal infections affect all ages, sexes, races, and creeds.  There may be many factors that predispose you to a fungal infection such as age, coexisting medical conditions such as diabetes, or an autoimmune disease; stress, medications, fatigue, genetics, etc.  Bottom line: fungus thrives in a warm, moist environment and your shoes offer such a location.  IS IT CONTAGIOUS? Theoretically, yes.  You do not want to share shoes, nail clippers or files with someone who has fungal toenails.  Walking around barefoot in the same room or sleeping in the same bed is unlikely to transfer the organism.  It is important to realize, however, that fungus can spread easily from one nail to the next on the same foot.  HOW DO WE TREAT THIS?  There are several ways to treat this condition.  Treatment may depend on many factors such as age, medications, pregnancy, liver and kidney conditions, etc.  It is best to ask your doctor which options are available to you.  5. No treatment.   Unlike many other medical concerns, you can live with this condition.  However for many people this can be a painful condition and may lead to ingrown toenails or a bacterial  infection.  It is recommended that you keep the nails cut short to help reduce the amount of fungal nail. 6. Topical treatment.  These range from herbal remedies to prescription strength nail lacquers.  About 40-50% effective, topicals require twice daily application for approximately 9 to 12 months or until an entirely new nail has grown out.  The most effective topicals are medical grade medications available through physicians offices. 7. Oral antifungal medications.  With an 80-90% cure rate, the most common oral medication requires 3 to 4 months of therapy and stays in your system for a year as the new nail grows out.  Oral antifungal medications do require blood work to make   sure it is a safe drug for you.  A liver function panel will be performed prior to starting the medication and after the first month of treatment.  It is important to have the blood work performed to avoid any harmful side effects.  In general, this medication safe but blood work is required. 8. Laser Therapy.  This treatment is performed by applying a specialized laser to the affected nail plate.  This therapy is noninvasive, fast, and non-painful.  It is not covered by insurance and is therefore, out of pocket.  The results have been very good with a 80-95% cure rate.  The [facility] is the only practice in the area to offer this therapy. 9. Permanent Nail Avulsion.  Removing the entire nail so that a new nail will not grow back. 

## 2019-07-31 DIAGNOSIS — I129 Hypertensive chronic kidney disease with stage 1 through stage 4 chronic kidney disease, or unspecified chronic kidney disease: Secondary | ICD-10-CM | POA: Diagnosis not present

## 2019-08-02 DIAGNOSIS — M109 Gout, unspecified: Secondary | ICD-10-CM | POA: Diagnosis not present

## 2019-08-02 DIAGNOSIS — G40909 Epilepsy, unspecified, not intractable, without status epilepticus: Secondary | ICD-10-CM | POA: Diagnosis not present

## 2019-08-02 DIAGNOSIS — I129 Hypertensive chronic kidney disease with stage 1 through stage 4 chronic kidney disease, or unspecified chronic kidney disease: Secondary | ICD-10-CM | POA: Diagnosis not present

## 2019-08-02 DIAGNOSIS — N183 Chronic kidney disease, stage 3 unspecified: Secondary | ICD-10-CM | POA: Diagnosis not present

## 2019-08-02 DIAGNOSIS — D509 Iron deficiency anemia, unspecified: Secondary | ICD-10-CM | POA: Diagnosis not present

## 2019-08-02 DIAGNOSIS — F17200 Nicotine dependence, unspecified, uncomplicated: Secondary | ICD-10-CM | POA: Diagnosis not present

## 2019-08-02 DIAGNOSIS — Z7982 Long term (current) use of aspirin: Secondary | ICD-10-CM | POA: Diagnosis not present

## 2019-08-02 DIAGNOSIS — E46 Unspecified protein-calorie malnutrition: Secondary | ICD-10-CM | POA: Diagnosis not present

## 2019-08-02 DIAGNOSIS — M810 Age-related osteoporosis without current pathological fracture: Secondary | ICD-10-CM | POA: Diagnosis not present

## 2019-08-04 NOTE — Progress Notes (Signed)
Subjective: [name] presents today referred by [name], MD for complaint of 3 month history painful thick toenails that are difficult to trim. Pain interferes with ambulation. Aggravating factors include wearing enclosed shoe gear. She states she cannot cut them. Denies any numbness, tingling or burning. Denies any h/o foot wounds. She does have h/o gout in her fingers.  Past Medical History:  Diagnosis Date  . Congestive heart failure (CHF) (HCC)   . Epilepsy (HCC)   . Hypertension   . Seizures (HCC)   . Smoker   . Stroke [facility])      Patient Active Problem List   Diagnosis Date Noted  . Cough   . Encounter for palliative care   . Goals of care, counseling/discussion   . Pneumonia   . NICM (nonischemic cardiomyopathy) -EF 25% 05/20/2015  . No significant CAD at cath May 2012 05/20/2015  . Anemia 05/20/2015  . Diastolic dysfunction-grade 3 05/20/2015  . PNA (pneumonia) 05/20/2015  . Acute renal failure superimposed on stage 3 chronic kidney disease (HCC)   . CAP (community acquired pneumonia)   . Hyperammonemia (HCC) 05/17/2015  . Metabolic encephalopathy 05/16/2015  . Acute on chronic renal insufficiency 05/15/2015  . Chest pain 05/14/2015  . Troponin level elevated 05/14/2015  . Chronic combined systolic and diastolic CHF (congestive heart failure) (HCC) 03/06/2014  . Acute combined systolic and diastolic congestive heart failure (HCC) 02/24/2013  . Weight gain 02/24/2013  . Volume overload 02/24/2013  . Hyperglycemia 02/24/2013  . Dyspnea 02/24/2013  . Constipation 02/24/2013  . Abdominal pain 02/24/2013  . Pleuritic chest pain 02/24/2013  . Epilepsy (HCC)   . Smoker   . 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 Protein-calorie malnutrition, severe- wgt 86 lbs 02/13/2013  . Pneumobilia 02/12/2013  . Influenza A 02/11/2013  . Hyperkalemia 02/11/2013  . Hyponatremia 02/11/2013  . HCAP (healthcare-associated pneumonia) 02/11/2013  . Liver  enzyme elevation 02/11/2013  . Basal ganglia infarction (HCC) 01/08/2013  . H/O: CVA (cerebrovascular accident) 01/04/2013  . Generalized seizure (HCC) 12/28/2012  . 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 Seizure (HCC) 12/28/2012  . Metabolic acidosis 12/28/2012  . Encephalopathy 12/28/2012  . Acute renal failure (HCC) 12/28/2012     Past Surgical History:  Procedure Laterality Date  . NO PAST SURGERIES       Current Outpatient Medications on File Prior to Visit  Medication Sig Dispense Refill  . allopurinol (ZYLOPRIM) 100 MG tablet Take 200 mg by mouth daily as needed (gout).   5  . amLODipine (NORVASC) 10 MG tablet Take 10 mg by mouth daily.    Marland Kitchen aspirin 81 MG EC tablet Take 81 mg by mouth every morning.     . calcium carbonate (OSCAL) 1500 (600 Ca) MG TABS tablet SMARTSIG:1 Tablet(s) By Mouth Every Evening    . carvedilol (COREG) 6.25 MG tablet Take 1 tablet (6.25 mg total) by mouth 2 (two) times daily with a meal. (Patient taking differently: Take 12.5 mg by mouth 2 (two) times daily with a meal. ) 60 tablet 3  . donepezil (ARICEPT) 5 MG tablet Take 5 mg by mouth at bedtime.    . FEROSUL 325 (65 Fe) MG tablet Take 325 mg by mouth every morning.    . furosemide (LASIX) 20 MG tablet Take 1 tablet (20 mg total) by mouth daily. 30 tablet 1  . hyoscyamine (LEVSIN) 0.125 MG tablet Take by mouth every 4 (four) hours as needed.    . Lacosamide (VIMPAT)  100 MG TABS Take 1 tablet by mouth twice a day as directed by physician. 180 tablet 1  . Lacosamide (VIMPAT) 100 MG TABS Take 1 tablet (100 mg total) by mouth in the morning and at bedtime. 28 tablet 0  . levETIRAcetam (KEPPRA) 500 MG tablet TAKE ONE-HALF TABLET BY MOUTH TWICE DAILY 90 tablet 3  . losartan (COZAAR) 100 MG tablet Take 100 mg by mouth every morning.    Marland Kitchen losartan (COZAAR) 25 MG tablet Take 25 mg by mouth daily.  5  . sodium bicarbonate 650 MG tablet Take 1,300 mg by mouth 2 (two) times daily.     No current  facility-administered medications on file prior to visit.     No Known Allergies   Social History   Occupational History    Comment: Retired  Tobacco Use  . Smoking status: Current Some Day Smoker    Packs/day: 0.25    Types: Cigarettes  . Smokeless tobacco: Never Used  . Tobacco comment: 1/2 caigarettes a day  Substance and Sexual Activity  . Alcohol use: Yes    Alcohol/week: 0.0 standard drinks    Comment: occasional  . Drug use: No  . Sexual activity: Never     Family History  Problem Relation Age of Onset  . Cancer Father   . Anemia Daughter      There is no immunization history for the selected administration types on file for this patient.   Objective: [name] is a/an 79 y.o. female WD, WN in NAD.Marland Kitchen AAO x 3. Vitals:   07/30/19 1453  BP: 135/72  Pulse: 74    Vascular Examination:  Capillary fill time to digits <3 seconds b/l lower extremities. Palpable DP pulse(s) b/l lower extremities Faintly palpable PT pulse(s) b/l lower extremities. Pedal hair absent. Lower extremity skin temperature gradient within normal limits. No pain with calf compression b/l.  Dermatological Examination: Pedal skin with normal turgor, texture and tone bilaterally. No open wounds bilaterally. No interdigital macerations bilaterally. Toenails 1-5 b/l elongated, discolored, dystrophic, thickened, crumbly with subungual debris and tenderness to dorsal palpation.  Musculoskeletal: Normal muscle strength 5/5 to all lower extremity muscle groups bilaterally. No pain crepitus or joint limitation noted with ROM b/l. No gross bony deformities bilaterally. Pes planus deformity noted b/l.   Neurological: Protective sensation intact 5/5 intact bilaterally with 10g monofilament b/l. Vibratory sensation decreased b/l.  Assessment: 1. Pain due to onychomycosis of toenails of both feet   2. Pes planus of both feet    Plan: -Examined patient. -Toenails 1-5 b/l were debrided in length and  girth with sterile nail nippers and dremel without iatrogenic bleeding.  -Patient to report any pedal injuries to medical professional immediately. -Patient to continue soft, supportive shoe gear daily. -Patient/POA to call should there be question/concern in the interim.  Return in about 4 months (around 11/29/2019) for diabetic nail trim.

## 2019-08-07 DIAGNOSIS — F0390 Unspecified dementia without behavioral disturbance: Secondary | ICD-10-CM | POA: Diagnosis not present

## 2019-08-07 DIAGNOSIS — I639 Cerebral infarction, unspecified: Secondary | ICD-10-CM | POA: Diagnosis not present

## 2019-08-07 DIAGNOSIS — N183 Chronic kidney disease, stage 3 unspecified: Secondary | ICD-10-CM | POA: Diagnosis not present

## 2019-08-07 DIAGNOSIS — I10 Essential (primary) hypertension: Secondary | ICD-10-CM | POA: Diagnosis not present

## 2019-08-07 DIAGNOSIS — M810 Age-related osteoporosis without current pathological fracture: Secondary | ICD-10-CM | POA: Diagnosis not present

## 2019-08-07 DIAGNOSIS — I129 Hypertensive chronic kidney disease with stage 1 through stage 4 chronic kidney disease, or unspecified chronic kidney disease: Secondary | ICD-10-CM | POA: Diagnosis not present

## 2019-08-07 DIAGNOSIS — D509 Iron deficiency anemia, unspecified: Secondary | ICD-10-CM | POA: Diagnosis not present

## 2019-08-20 DIAGNOSIS — Z87898 Personal history of other specified conditions: Secondary | ICD-10-CM | POA: Diagnosis not present

## 2019-08-20 DIAGNOSIS — R600 Localized edema: Secondary | ICD-10-CM | POA: Diagnosis not present

## 2019-08-20 DIAGNOSIS — R296 Repeated falls: Secondary | ICD-10-CM | POA: Diagnosis not present

## 2019-08-20 DIAGNOSIS — R42 Dizziness and giddiness: Secondary | ICD-10-CM | POA: Diagnosis not present

## 2019-08-27 ENCOUNTER — Other Ambulatory Visit: Payer: Self-pay | Admitting: Family Medicine

## 2019-08-27 DIAGNOSIS — R296 Repeated falls: Secondary | ICD-10-CM

## 2019-08-27 DIAGNOSIS — Z87898 Personal history of other specified conditions: Secondary | ICD-10-CM

## 2019-08-27 DIAGNOSIS — R42 Dizziness and giddiness: Secondary | ICD-10-CM

## 2019-08-29 ENCOUNTER — Other Ambulatory Visit: Payer: Self-pay | Admitting: Neurology

## 2019-08-29 ENCOUNTER — Other Ambulatory Visit: Payer: Self-pay

## 2019-08-29 ENCOUNTER — Ambulatory Visit
Admission: RE | Admit: 2019-08-29 | Discharge: 2019-08-29 | Disposition: A | Discharge status: Home / Self Care | Payer: Medicare HMO | Source: Ambulatory Visit | Attending: Family Medicine | Admitting: Family Medicine

## 2019-08-29 DIAGNOSIS — G9389 Other specified disorders of brain: Secondary | ICD-10-CM | POA: Diagnosis not present

## 2019-08-29 DIAGNOSIS — R296 Repeated falls: Secondary | ICD-10-CM

## 2019-08-29 DIAGNOSIS — I6782 Cerebral ischemia: Secondary | ICD-10-CM | POA: Diagnosis not present

## 2019-08-29 DIAGNOSIS — G319 Degenerative disease of nervous system, unspecified: Secondary | ICD-10-CM | POA: Diagnosis not present

## 2019-08-29 DIAGNOSIS — I6381 Other cerebral infarction due to occlusion or stenosis of small artery: Secondary | ICD-10-CM | POA: Diagnosis not present

## 2019-08-29 DIAGNOSIS — Z87898 Personal history of other specified conditions: Secondary | ICD-10-CM

## 2019-08-29 DIAGNOSIS — R42 Dizziness and giddiness: Secondary | ICD-10-CM

## 2019-09-11 DIAGNOSIS — N183 Chronic kidney disease, stage 3 unspecified: Secondary | ICD-10-CM | POA: Diagnosis not present

## 2019-09-11 DIAGNOSIS — I509 Heart failure, unspecified: Secondary | ICD-10-CM | POA: Diagnosis not present

## 2019-09-11 DIAGNOSIS — I129 Hypertensive chronic kidney disease with stage 1 through stage 4 chronic kidney disease, or unspecified chronic kidney disease: Secondary | ICD-10-CM | POA: Diagnosis not present

## 2019-09-11 DIAGNOSIS — N2581 Secondary hyperparathyroidism of renal origin: Secondary | ICD-10-CM | POA: Diagnosis not present

## 2019-09-11 DIAGNOSIS — D631 Anemia in chronic kidney disease: Secondary | ICD-10-CM | POA: Diagnosis not present

## 2019-09-11 DIAGNOSIS — M109 Gout, unspecified: Secondary | ICD-10-CM | POA: Diagnosis not present

## 2019-09-11 DIAGNOSIS — N189 Chronic kidney disease, unspecified: Secondary | ICD-10-CM | POA: Diagnosis not present

## 2019-09-11 DIAGNOSIS — I639 Cerebral infarction, unspecified: Secondary | ICD-10-CM | POA: Diagnosis not present

## 2019-09-11 DIAGNOSIS — G40909 Epilepsy, unspecified, not intractable, without status epilepticus: Secondary | ICD-10-CM | POA: Diagnosis not present

## 2019-09-11 DIAGNOSIS — F17200 Nicotine dependence, unspecified, uncomplicated: Secondary | ICD-10-CM | POA: Diagnosis not present

## 2019-09-23 ENCOUNTER — Other Ambulatory Visit: Payer: Self-pay | Admitting: Neurology

## 2019-09-24 DIAGNOSIS — N183 Chronic kidney disease, stage 3 unspecified: Secondary | ICD-10-CM | POA: Diagnosis not present

## 2019-09-25 ENCOUNTER — Telehealth: Payer: Self-pay | Admitting: Adult Health

## 2019-09-25 MED ORDER — VIMPAT 100 MG PO TABS: ORAL_TABLET | ORAL | 0 refills | Status: DC

## 2019-09-25 MED ORDER — VIMPAT 100 MG PO TABS: 100.0000 mg | ORAL_TABLET | Freq: Two times a day (BID) | ORAL | 5 refills | Status: DC

## 2019-09-25 NOTE — Addendum Note (Signed)
Addended by: Guy Begin on: 09/25/2019 02:29 PM   Modules accepted: Orders

## 2019-09-25 NOTE — Addendum Note (Signed)
Addended by: Melvyn Novas on: 09/25/2019 04:54 PM   Modules accepted: Orders

## 2019-09-25 NOTE — Telephone Encounter (Signed)
[facility]) called to inform prescription for Lacosamide (VIMPAT) 100 MG TABS has to come from a supervising physician cannot be a NP

## 2019-09-25 NOTE — Telephone Encounter (Signed)
Printed script to Dr. Vickey Huger to sign.

## 2019-09-25 NOTE — Telephone Encounter (Signed)
[facility] needs MD signature for VIMPAT.  Pt has appt 09-30-19.

## 2019-09-25 NOTE — Addendum Note (Signed)
Addended by: Guy Begin on: 09/25/2019 05:09 PM   Modules accepted: Orders

## 2019-09-25 NOTE — Telephone Encounter (Signed)
Please pre-write so I can sign and release script. With refills -

## 2019-09-27 ENCOUNTER — Encounter: Payer: Self-pay | Admitting: Interventional Cardiology

## 2019-09-27 ENCOUNTER — Other Ambulatory Visit: Payer: Self-pay

## 2019-09-27 ENCOUNTER — Ambulatory Visit: Payer: Medicare HMO | Admitting: Interventional Cardiology

## 2019-09-27 VITALS — BP 106/62 | HR 63 | Ht 61.0 in | Wt 78.8 lb

## 2019-09-27 DIAGNOSIS — N183 Chronic kidney disease, stage 3 unspecified: Secondary | ICD-10-CM | POA: Diagnosis not present

## 2019-09-27 DIAGNOSIS — I361 Nonrheumatic tricuspid (valve) insufficiency: Secondary | ICD-10-CM | POA: Diagnosis not present

## 2019-09-27 DIAGNOSIS — M810 Age-related osteoporosis without current pathological fracture: Secondary | ICD-10-CM | POA: Diagnosis not present

## 2019-09-27 DIAGNOSIS — M7989 Other specified soft tissue disorders: Secondary | ICD-10-CM

## 2019-09-27 DIAGNOSIS — E43 Unspecified severe protein-calorie malnutrition: Secondary | ICD-10-CM

## 2019-09-27 DIAGNOSIS — I10 Essential (primary) hypertension: Secondary | ICD-10-CM | POA: Diagnosis not present

## 2019-09-27 DIAGNOSIS — I5022 Chronic systolic (congestive) heart failure: Secondary | ICD-10-CM

## 2019-09-27 DIAGNOSIS — F0390 Unspecified dementia without behavioral disturbance: Secondary | ICD-10-CM | POA: Diagnosis not present

## 2019-09-27 DIAGNOSIS — D509 Iron deficiency anemia, unspecified: Secondary | ICD-10-CM | POA: Diagnosis not present

## 2019-09-27 DIAGNOSIS — I129 Hypertensive chronic kidney disease with stage 1 through stage 4 chronic kidney disease, or unspecified chronic kidney disease: Secondary | ICD-10-CM | POA: Diagnosis not present

## 2019-09-27 DIAGNOSIS — I639 Cerebral infarction, unspecified: Secondary | ICD-10-CM | POA: Diagnosis not present

## 2019-09-27 NOTE — Progress Notes (Signed)
Cardiology Office Note   Date:  09/27/2019   ID:  [name], DOB 1940/08/03, MRN [id number]  PCP:  [name], MD    No chief complaint on file.  CHF  Wt Readings from Last 3 Encounters:  09/27/19 78 lb 12.8 oz (35.7 kg)  09/27/18 77 lb 9.6 oz (35.2 kg)  03/06/18 78 lb 12.8 oz (35.7 kg)       History of Present Illness: [name] is a 79 y.o. female who is being seen today for the evaluation of chronic systolic heart fialure at the request of [name], [place].  Prior records show she: " has a history of HTN, CVA, CKD and chronic biventricular CHF and wide open TR who presents to clinic for post hospital follow up.   She had an emergent cath in 2010 for ECG concerning for STEMI. This revealed no significant CAD and normal LV function. 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   2D ECHO in 12/2012, which was done for CVA, showed EF of 35-40% with diffuse HK, mild MR and PA pressure 48.   Left ventricle: The cavity size was normal. Wall thickness was  increased in a pattern of mild LVH. Systolic function was  severely reduced. The estimated ejection fraction was in the  range of 25% to 30%. Doppler parameters are consistent with a  reversible restrictive pattern, indicative of decreased left  ventricular diastolic compliance and/or increased left atrial  pressure (grade 3 diastolic dysfunction).  - Ventricular septum: The contour showed diastolic flattening.  - Aortic valve: There was trivial regurgitation.  - Mitral valve: There was mild regurgitation.  - Right ventricle: The cavity size was severely dilated. Systolic  function was moderately to severely reduced.  - Right atrium: The atrium was moderately dilated.  - Tricuspid valve: There was wide-open regurgitation.   Impressions:   - The LV systolic and diastolic function are severely reduced.  The TV has wide open TR.  The RV / RA  gradient was measured only at 15 ( giving an  estimated PA pressure of 30)  The RV / PA pressure appears to be higher than that - the RV is  enlarged . There is flattening of the ventricular septum in  diastole. "  She had some leg swelling about a month ago after going to [place].  This has resolved itself.  Her chronic issue has been trying to eat enough to gain weight.  Denies : Chest pain. Dizziness. Leg edema. Nitroglycerin use. Orthopnea. Palpitations. Paroxysmal nocturnal dyspnea. Shortness of breath. Syncope.         Past Medical History:  Diagnosis Date  . Abdominal pain 02/24/2013  . Acute combined systolic and diastolic congestive heart failure (HCC) 02/24/2013  . Acute on chronic renal insufficiency 05/15/2015  . Acute renal failure (HCC) 12/28/2012  . Acute renal failure superimposed on stage 3 chronic kidney disease (HCC)   . Anemia 05/20/2015  . 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 Basal ganglia infarction (HCC) 01/08/2013  . CAP (community acquired pneumonia)   . Chest pain 05/14/2015  . Chronic combined systolic and diastolic CHF (congestive heart failure) (HCC) 03/06/2014  . Congestive heart failure (CHF) (HCC)   . Constipation 02/24/2013  . Cough   . Diastolic dysfunction-grade 3 05/20/2015  . Dyspnea 02/24/2013  . Encephalopathy 12/28/2012  . Epilepsy (HCC)   . Generalized seizure (HCC) 12/28/2012  . H/O: CVA (cerebrovascular  accident) 01/04/2013  . Hyperammonemia (HCC) 05/17/2015  . Hyperglycemia 02/24/2013  . Hyperkalemia 02/11/2013  . Hypertension   . 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 Hyponatremia 02/11/2013  . Influenza A 02/11/2013  . Liver enzyme elevation 02/11/2013  . Metabolic acidosis 12/28/2012  . Metabolic encephalopathy 05/16/2015  . NICM (nonischemic cardiomyopathy) -EF 25% 05/20/2015  . Pleuritic chest pain 02/24/2013  . PNA (pneumonia) 05/20/2015  . Pneumobilia 02/12/2013  . Pneumonia   . Seizure  (HCC) 12/28/2012  . Seizures (HCC)   . Smoker   . Stroke (HCC)   . Troponin level elevated 05/14/2015  . Volume overload 02/24/2013  . Weight gain 02/24/2013    Past Surgical History:  Procedure Laterality Date  . NO PAST SURGERIES       Current Outpatient Medications  Medication Sig Dispense Refill  . allopurinol (ZYLOPRIM) 100 MG tablet Take 100 mg by mouth daily.    Marland Kitchen amLODipine (NORVASC) 10 MG tablet Take 10 mg by mouth daily.    Marland Kitchen aspirin 81 MG EC tablet Take 81 mg by mouth every morning.     . calcium carbonate (OSCAL) 1500 (600 Ca) MG TABS tablet SMARTSIG:1 Tablet(s) By Mouth Every Evening    . carvedilol (COREG) 12.5 MG tablet Take 12.5 mg by mouth 2 (two) times daily with a meal.    . donepezil (ARICEPT) 5 MG tablet Take 5 mg by mouth at bedtime.    . FEROSUL 325 (65 Fe) MG tablet Take 325 mg by mouth every morning.    . furosemide (LASIX) 20 MG tablet Take 1 tablet (20 mg total) by mouth daily. 30 tablet 1  . hyoscyamine (LEVSIN) 0.125 MG tablet Take by mouth every 4 (four) hours as needed.    . Lacosamide (VIMPAT) 100 MG TABS Take 1 tablet by mouth twice a day as directed by physician. 180 tablet 0  . levETIRAcetam (KEPPRA) 500 MG tablet TAKE 1/2 TABLET BY MOUTH TWICE DAILY 90 tablet 3  . losartan (COZAAR) 100 MG tablet Take 100 mg by mouth every morning.    . sodium bicarbonate 650 MG tablet Take 1,300 mg by mouth 2 (two) times daily.     No current facility-administered medications for this visit.    Allergies:   Patient has no known allergies.    Social History:  The patient  reports that she has been smoking cigarettes. She has been smoking about 0.25 packs per day. She has never used smokeless tobacco. She reports current alcohol use. She reports that she does not use drugs.   Family History:  The patient's *family history includes Anemia in her daughter; Cancer in her father.    ROS:  Please see the history of present illness.   Otherwise, review of systems are  positive for occasional leg swelling.   All other systems are reviewed and negative.    PHYSICAL EXAM: VS:  BP 106/62   Pulse 63   Ht 5\' 1"  (1.549 m)   Wt 78 lb 12.8 oz (35.7 kg)   SpO2 96%   BMI 14.89 kg/m  , BMI Body mass index is 14.89 kg/m. GEN: Frail, in no acute distress  HEENT: normal  Neck: no JVD, carotid bruits, or masses Cardiac: RRR; no murmurs, rubs, or gallops,no edema  Respiratory:  clear to auscultation bilaterally, normal work of breathing GI: soft, nontender, nondistended, + BS MS: no deformity or atrophy  Skin: warm and dry, no rash Neuro:  Strength and sensation are intact Psych: euthymic  mood, full affect   EKG:   The ekg ordered today demonstrates NSR, IVCD   Recent Labs: No results found for requested labs within last 8760 hours.   Lipid Panel    Component Value Date/Time   CHOL 175 03/06/2014 0600   TRIG 73 03/06/2014 0600   HDL 68 03/06/2014 0600   CHOLHDL 2.6 03/06/2014 0600   VLDL 15 03/06/2014 0600   LDLCALC 92 03/06/2014 0600     Other studies Reviewed: Additional studies/ records that were reviewed today with results demonstrating: prior echo reviewed; Labs from [facility] show Cr 1.76 and BNP 442 in 7/21.   ASSESSMENT AND PLAN:  1. Chronic systolic heart failure: I reviewed her old echocardiogram report.  She had severe LV dysfunction and severe tricuspid regurgitation.  She has done remarkably well.  She has no signs of volume overload.  The edema that she had a month ago occurred when she was in a car and went to [place].  Likely this was due to her legs being down.  This has resolved.  Continue low-salt diet.  I did encourage her to try to eat more due to her very low weight.  Continue diuretic as currently prescribed as she looks euvolemic.  Check echocardiogram to see where her LV and RV function is at this point. 2. Chronic renal insufficiency: This will limit changing her medications.  I would continue with her current dose of ARB  rather than considering Entresto at this point. 3. Hypertension: Blood pressure well controlled. 4. I encouraged her to try to use the nutritional shakes to help gain weight. 5. Given her memory issues and overall frailty along with malnutrition, I do not think she is a candidate for any invasive testing or therapies.  Could consider palliative care consult as well.   Current medicines are reviewed at length with the patient today.  The patient concerns regarding her medicines were addressed.  The following changes have been made:  No change  Labs/ tests ordered today include:  No orders of the defined types were placed in this encounter.   Recommend 150 minutes/week of aerobic exercise Low fat, low carb, high fiber diet recommended  Disposition:   FU in for echo   Signed, Lance Muss, MD  09/27/2019 3:16 PM    [facility] [address] [place], [place], [place]  [address] Phone: [phone]; Fax: [phone]

## 2019-09-27 NOTE — Patient Instructions (Signed)
Medication Instructions:  Your physician recommends that you continue on your current medications as directed. Please refer to the Current Medication list given to you today.  *If you need a refill on your cardiac medications before your next appointment, please call your pharmacy*   Lab Work: None  If you have labs (blood work) drawn today and your tests are completely normal, you will receive your results only by: Marland Kitchen MyChart Message (if you have MyChart) OR . A paper copy in the mail If you have any lab test that is abnormal or we need to change your treatment, we will call you to review the results.   Testing/Procedures: Your physician has requested that you have an echocardiogram. Echocardiography is a painless test that uses sound waves to create images of your heart. It provides your doctor with information about the size and shape of your heart and how well your heart's chambers and valves are working. This procedure takes approximately one hour. There are no restrictions for this procedure.  Follow-Up: At [facility], you and your health needs are our priority.  As part of our continuing mission to provide you with exceptional heart care, we have created designated Provider Care Teams.  These Care Teams include your primary Cardiologist (physician) and Advanced Practice Providers (APPs -  Physician Assistants and Nurse Practitioners) who all work together to provide you with the care you need, when you need it.  We recommend signing up for the patient portal called "MyChart".  Sign up information is provided on this After Visit Summary.  MyChart is used to connect with patients for Virtual Visits (Telemedicine).  Patients are able to view lab/test results, encounter notes, upcoming appointments, etc.  Non-urgent messages can be sent to your provider as well.   To learn more about what you can do with MyChart, go to [email or web address].    Your next appointment:   6  month(s)  The format for your next appointment:   In Person  Provider:   Ronie Spies, PA-C or Jacolyn Reedy, PA-C   Your next appointment:   12 month(s)  The format for your next appointment:   In Person  Provider:   Everette Rank, MD   Other Instructions None

## 2019-09-30 ENCOUNTER — Other Ambulatory Visit: Payer: Self-pay

## 2019-09-30 ENCOUNTER — Ambulatory Visit: Payer: Medicare HMO | Admitting: Adult Health

## 2019-09-30 ENCOUNTER — Encounter: Payer: Self-pay | Admitting: Adult Health

## 2019-09-30 VITALS — BP 133/79 | HR 62 | Ht 61.0 in | Wt 80.2 lb

## 2019-09-30 DIAGNOSIS — R569 Unspecified convulsions: Secondary | ICD-10-CM | POA: Diagnosis not present

## 2019-09-30 NOTE — Patient Instructions (Signed)
Your Plan:  Continue vimpat and keppra If your symptoms worsen or you develop new symptoms please let us know.    Thank you for coming to see [place] at [facility]. I hope we have been able to provide you high quality care today.  You may receive a patient satisfaction survey over the next few weeks. We would appreciate your feedback and comments so that we may continue to improve ourselves and the health of our patients.

## 2019-09-30 NOTE — Progress Notes (Addendum)
PATIENT: [name] DOB: December 17, 1940  REASON FOR VISIT: follow up HISTORY FROM: patient  HISTORY OF PRESENT ILLNESS: Today 09/30/19:  [name] is a 79 year old female with a history of seizures.  She returns today for follow-up.  She remains on Keppra and Vimpat.  She denies any seizure events.  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  Denies any changes in her mood or behavior.  She returns today for an evaluation.  HISTORY 09/27/18:  [name] is a 79 year old female with a history of seizures.  She returns today for follow-up.  She remains on Keppra 500 mg daily and Vimpat 100 mg twice a day.  She reports that she has been tolerating these medications well.  She does not have any seizure events.  Her blood pressure is elevated today.  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  She denies any new symptoms.  She returns today for evaluation.  REVIEW OF SYSTEMS: Out of a complete 14 system review of symptoms, the patient complains only of the following symptoms, and all other reviewed systems are negative.  See HPI  ALLERGIES: No Known Allergies  HOME MEDICATIONS: Outpatient Medications Prior to Visit  Medication Sig Dispense Refill  . allopurinol (ZYLOPRIM) 100 MG tablet Take 100 mg by mouth daily.    Marland Kitchen amLODipine (NORVASC) 10 MG tablet Take 10 mg by mouth daily.    Marland Kitchen aspirin 81 MG EC tablet Take 81 mg by mouth every morning.     . calcium carbonate (OSCAL) 1500 (600 Ca) MG TABS tablet SMARTSIG:1 Tablet(s) By Mouth Every Evening    . carvedilol (COREG) 12.5 MG tablet Take 12.5 mg by mouth 2 (two) times daily with a meal.    . donepezil (ARICEPT) 5 MG tablet Take 5 mg by mouth at bedtime.    . FEROSUL 325 (65 Fe) MG tablet Take 325 mg by mouth every morning.    . furosemide (LASIX) 20 MG tablet Take 1 tablet (20 mg total) by mouth daily. 30 tablet 1  . hyoscyamine (LEVSIN) 0.125 MG tablet Take by mouth every 4 (four) hours as needed.    .  Lacosamide (VIMPAT) 100 MG TABS Take 1 tablet by mouth twice a day as directed by physician. 180 tablet 0  . levETIRAcetam (KEPPRA) 500 MG tablet TAKE 1/2 TABLET BY MOUTH TWICE DAILY 90 tablet 3  . losartan (COZAAR) 100 MG tablet Take 100 mg by mouth every morning.    . sodium bicarbonate 650 MG tablet Take 1,300 mg by mouth 2 (two) times daily.     No facility-administered medications prior to visit.    PAST MEDICAL HISTORY: Past Medical History:  Diagnosis Date  . Abdominal pain 02/24/2013  . Acute combined systolic and diastolic congestive heart failure (HCC) 02/24/2013  . Acute on chronic renal insufficiency 05/15/2015  . Acute renal failure (HCC) 12/28/2012  . Acute renal failure superimposed on stage 3 chronic kidney disease (HCC)   . Anemia 05/20/2015  . 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 Basal ganglia infarction (HCC) 01/08/2013  . CAP (community acquired pneumonia)   . Chest pain 05/14/2015  . Chronic combined systolic and diastolic CHF (congestive heart failure) (HCC) 03/06/2014  . Congestive heart failure (CHF) (HCC)   . Constipation 02/24/2013  . Cough   . Diastolic dysfunction-grade 3 05/20/2015  . Dyspnea 02/24/2013  . Encephalopathy 12/28/2012  . Epilepsy (HCC)   . Generalized seizure (HCC) 12/28/2012  . H/O: CVA (cerebrovascular accident)  01/04/2013  . Hyperammonemia (HCC) 05/17/2015  . Hyperglycemia 02/24/2013  . Hyperkalemia 02/11/2013  . Hypertension   . 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 Hyponatremia 02/11/2013  . Influenza A 02/11/2013  . Liver enzyme elevation 02/11/2013  . Metabolic acidosis 12/28/2012  . Metabolic encephalopathy 05/16/2015  . NICM (nonischemic cardiomyopathy) -EF 25% 05/20/2015  . Pleuritic chest pain 02/24/2013  . PNA (pneumonia) 05/20/2015  . Pneumobilia 02/12/2013  . Pneumonia   . Seizure (HCC) 12/28/2012  . Seizures (HCC)   . Smoker   . Stroke (HCC)   . Troponin level elevated 05/14/2015  . Volume  overload 02/24/2013  . Weight gain 02/24/2013    PAST SURGICAL HISTORY: Past Surgical History:  Procedure Laterality Date  . NO PAST SURGERIES      FAMILY HISTORY: Family History  Problem Relation Age of Onset  . Cancer Father   . Anemia Daughter     SOCIAL HISTORY: Social History   Socioeconomic History  . Marital status: Single    Spouse name: Not on file  . Number of children: 3  . Years of education: 22  . Highest education level: Not on file  Occupational History    Comment: Retired  Tobacco Use  . Smoking status: Current Some Day Smoker    Packs/day: 0.25    Types: Cigarettes  . Smokeless tobacco: Never Used  . Tobacco comment: 1/2 caigarettes a day  Substance and Sexual Activity  . Alcohol use: Yes    Alcohol/week: 0.0 standard drinks    Comment: occasional  . Drug use: No  . Sexual activity: Never  Other Topics Concern  . Not on file  Social History Narrative   Patient lives at home alone.    Retired.   Education one year of college.   Left handed.   Caffeine one cup of coffee daily and one cup of tea.   Social Determinants of Health   Financial Resource Strain:   . Difficulty of Paying Living Expenses: Not on file  Food Insecurity:   . Worried About Programme researcher, broadcasting/film/video in the Last Year: Not on file  . Ran Out of Food in the Last Year: Not on file  Transportation Needs:   . Lack of Transportation (Medical): Not on file  . Lack of Transportation (Non-Medical): Not on file  Physical Activity:   . Days of Exercise per Week: Not on file  . Minutes of Exercise per Session: Not on file  Stress:   . Feeling of Stress : Not on file  Social Connections:   . Frequency of Communication with Friends and Family: Not on file  . Frequency of Social Gatherings with Friends and Family: Not on file  . Attends Religious Services: Not on file  . Active Member of Clubs or Organizations: Not on file  . Attends Banker Meetings: Not on file  . Marital  Status: Not on file  Intimate Partner Violence:   . Fear of Current or Ex-Partner: Not on file  . Emotionally Abused: Not on file  . Physically Abused: Not on file  . Sexually Abused: Not on file      PHYSICAL EXAM  Vitals:   09/30/19 1425  BP: 133/79  Pulse: 62  Weight: 80 lb 3.2 oz (36.4 kg)  Height: 5\' 1"  (1.549 m)   Body mass index is 15.15 kg/m.  Generalized: Frail, in no acute distress   Neurological examination  Mentation: Alert oriented to time, place, history taking. Follows  all commands speech and language fluent Cranial nerve II-XII: Pupils were equal round reactive to light. Extraocular movements were full, visual field were full on confrontational test. Facial sensation and strength were normal. Uvula tongue midline. Head turning and shoulder shrug  were normal and symmetric. Motor: The motor testing reveals 5 over 5 strength of all 4 extremities. Good symmetric motor tone is noted throughout.  Sensory: Sensory testing is intact to soft touch on all 4 extremities. No evidence of extinction is noted.  Coordination: Cerebellar testing reveals good finger-nose-finger and heel-to-shin bilaterally.  Gait and station: Gait is normal. Reflexes: Deep tendon reflexes are symmetric and normal bilaterally.   DIAGNOSTIC DATA (LABS, IMAGING, TESTING) - I reviewed patient records, labs, notes, testing and imaging myself where available.  Lab Results  Component Value Date   WBC 5.7 07/23/2018   HGB 11.9 (L) 07/23/2018   HCT 35.0 (L) 07/23/2018   MCV 101.7 (H) 07/23/2018   PLT 222 07/23/2018      Component Value Date/Time   NA 138 07/23/2018 1719   NA 135 (A) 05/23/2015 0000   K 4.7 07/23/2018 1719   CL 110 07/23/2018 1719   CO2 20 (L) 07/23/2018 1657   GLUCOSE 88 07/23/2018 1719   BUN 31 (H) 07/23/2018 1719   BUN 40 (A) 05/23/2015 0000   CREATININE 1.50 (H) 07/23/2018 1719   CREATININE 1.42 (H) 06/04/2015 1515   CALCIUM 9.4 07/23/2018 1657   PROT 7.4 07/23/2018  1657   ALBUMIN 4.0 07/23/2018 1657   AST 20 07/23/2018 1657   ALT 13 07/23/2018 1657   ALKPHOS 52 07/23/2018 1657   BILITOT 0.7 07/23/2018 1657   GFRNONAA 33 (L) 07/23/2018 1657   GFRAA 38 (L) 07/23/2018 1657   Lab Results  Component Value Date   CHOL 175 03/06/2014   HDL 68 03/06/2014   LDLCALC 92 03/06/2014   TRIG 73 03/06/2014   CHOLHDL 2.6 03/06/2014   Lab Results  Component Value Date   HGBA1C 5.8 (H) 03/06/2014   Lab Results  Component Value Date   VITAMINB12 516 05/16/2015   Lab Results  Component Value Date   TSH 1.765 05/16/2015      ASSESSMENT AND PLAN 79 y.o. year old female  has a past medical history of Abdominal pain (02/24/2013), Acute combined systolic and diastolic congestive heart failure (HCC) (02/24/2013), Acute on chronic renal insufficiency (05/15/2015), Acute renal failure (HCC) (12/28/2012), Acute renal failure superimposed on stage 3 chronic kidney disease (HCC), Anemia (05/20/2015), Ataxia, late effect of cerebrovascular disease (02/19/2013), Basal ganglia infarction (HCC) (01/08/2013), CAP (community acquired pneumonia), Chest pain (05/14/2015), Chronic combined systolic and diastolic CHF (congestive heart failure) (HCC) (03/06/2014), Congestive heart failure (CHF) (HCC), Constipation (02/24/2013), Cough, Diastolic dysfunction-grade 3 (10/04/90), Dyspnea (02/24/2013), Encephalopathy (12/28/2012), Epilepsy (HCC), Generalized seizure (HCC) (12/28/2012), H/O: CVA (cerebrovascular accident) (01/04/2013), Hyperammonemia (HCC) (05/17/2015), Hyperglycemia (02/24/2013), Hyperkalemia (02/11/2013), Hypertension, Hypertensive heart and kidney disease (12/28/2012), Hyponatremia (02/11/2013), Influenza A (02/11/2013), Liver enzyme elevation (02/11/2013), Metabolic acidosis (12/28/2012), Metabolic encephalopathy (05/16/2015), NICM (nonischemic cardiomyopathy) -EF 25% (05/20/2015), Pleuritic chest pain (02/24/2013), PNA (pneumonia) (05/20/2015), Pneumobilia (02/12/2013), Pneumonia, Seizure  (HCC) (12/28/2012), Seizures (HCC), Smoker, Stroke (HCC), Troponin level elevated (05/14/2015), Volume overload (02/24/2013), and Weight gain (02/24/2013). here with:  Seizures   Continue Vimpat and Keppra  Advised if she has any seizure event she should let us know  Follow-up in 1 year or sooner if needed   I spent 30 minutes of face-to-face and non-face-to-face time with patient.  This included previsit chart review,  lab review, study review, order entry, electronic health record documentation, patient education.  Butch Penny, MSN, NP-C 09/30/2019, 2:31 PM [facility] [address], [address] [place], [place] [address] [phone]  I reviewed the above note and documentation by the Nurse Practitioner and agree with the history, exam, assessment and plan as outlined above. I was available for consultation. Huston Foley, MD, PhD [facility])

## 2019-10-03 ENCOUNTER — Other Ambulatory Visit: Payer: Self-pay | Admitting: Family Medicine

## 2019-10-03 DIAGNOSIS — Z1231 Encounter for screening mammogram for malignant neoplasm of breast: Secondary | ICD-10-CM

## 2019-10-16 ENCOUNTER — Ambulatory Visit (HOSPITAL_COMMUNITY): Payer: Medicare HMO | Attending: Internal Medicine

## 2019-10-16 ENCOUNTER — Other Ambulatory Visit: Payer: Self-pay

## 2019-10-16 DIAGNOSIS — I5022 Chronic systolic (congestive) heart failure: Secondary | ICD-10-CM | POA: Insufficient documentation

## 2019-10-16 LAB — ECHOCARDIOGRAM COMPLETE
P 1/2 time: 524 msec
S' Lateral: 2.6 cm

## 2019-10-17 ENCOUNTER — Other Ambulatory Visit: Payer: Self-pay | Admitting: Neurology

## 2019-10-31 ENCOUNTER — Other Ambulatory Visit: Payer: Self-pay

## 2019-10-31 ENCOUNTER — Ambulatory Visit
Admission: RE | Admit: 2019-10-31 | Discharge: 2019-10-31 | Disposition: A | Discharge status: Home / Self Care | Payer: Medicare HMO | Source: Ambulatory Visit | Attending: Family Medicine | Admitting: Family Medicine

## 2019-10-31 ENCOUNTER — Other Ambulatory Visit: Payer: Medicare HMO

## 2019-10-31 DIAGNOSIS — M810 Age-related osteoporosis without current pathological fracture: Secondary | ICD-10-CM | POA: Diagnosis not present

## 2019-10-31 DIAGNOSIS — D509 Iron deficiency anemia, unspecified: Secondary | ICD-10-CM | POA: Diagnosis not present

## 2019-10-31 DIAGNOSIS — I129 Hypertensive chronic kidney disease with stage 1 through stage 4 chronic kidney disease, or unspecified chronic kidney disease: Secondary | ICD-10-CM | POA: Diagnosis not present

## 2019-10-31 DIAGNOSIS — F0390 Unspecified dementia without behavioral disturbance: Secondary | ICD-10-CM | POA: Diagnosis not present

## 2019-10-31 DIAGNOSIS — Z1231 Encounter for screening mammogram for malignant neoplasm of breast: Secondary | ICD-10-CM

## 2019-10-31 DIAGNOSIS — N183 Chronic kidney disease, stage 3 unspecified: Secondary | ICD-10-CM | POA: Diagnosis not present

## 2019-10-31 DIAGNOSIS — I639 Cerebral infarction, unspecified: Secondary | ICD-10-CM | POA: Diagnosis not present

## 2019-10-31 DIAGNOSIS — I10 Essential (primary) hypertension: Secondary | ICD-10-CM | POA: Diagnosis not present

## 2019-11-29 ENCOUNTER — Ambulatory Visit: Payer: Medicare HMO | Admitting: Podiatry

## 2019-12-12 ENCOUNTER — Ambulatory Visit
Admission: RE | Admit: 2019-12-12 | Discharge: 2019-12-12 | Disposition: A | Discharge status: Home / Self Care | Payer: Medicare HMO | Source: Ambulatory Visit | Attending: Family Medicine | Admitting: Family Medicine

## 2019-12-12 ENCOUNTER — Other Ambulatory Visit: Payer: Self-pay

## 2019-12-12 DIAGNOSIS — Z1231 Encounter for screening mammogram for malignant neoplasm of breast: Secondary | ICD-10-CM | POA: Diagnosis not present

## 2019-12-13 ENCOUNTER — Other Ambulatory Visit: Payer: Self-pay | Admitting: Family Medicine

## 2019-12-13 DIAGNOSIS — Z1231 Encounter for screening mammogram for malignant neoplasm of breast: Secondary | ICD-10-CM

## 2019-12-16 ENCOUNTER — Ambulatory Visit
Admission: RE | Admit: 2019-12-16 | Discharge: 2019-12-16 | Disposition: A | Discharge status: Home / Self Care | Payer: Medicare HMO | Source: Ambulatory Visit | Attending: Family Medicine | Admitting: Family Medicine

## 2019-12-16 ENCOUNTER — Other Ambulatory Visit: Payer: Self-pay

## 2019-12-16 DIAGNOSIS — Z1231 Encounter for screening mammogram for malignant neoplasm of breast: Secondary | ICD-10-CM

## 2019-12-18 DIAGNOSIS — I129 Hypertensive chronic kidney disease with stage 1 through stage 4 chronic kidney disease, or unspecified chronic kidney disease: Secondary | ICD-10-CM | POA: Diagnosis not present

## 2019-12-18 DIAGNOSIS — I639 Cerebral infarction, unspecified: Secondary | ICD-10-CM | POA: Diagnosis not present

## 2019-12-18 DIAGNOSIS — G8929 Other chronic pain: Secondary | ICD-10-CM | POA: Diagnosis not present

## 2019-12-18 DIAGNOSIS — I10 Essential (primary) hypertension: Secondary | ICD-10-CM | POA: Diagnosis not present

## 2019-12-18 DIAGNOSIS — M810 Age-related osteoporosis without current pathological fracture: Secondary | ICD-10-CM | POA: Diagnosis not present

## 2019-12-18 DIAGNOSIS — D509 Iron deficiency anemia, unspecified: Secondary | ICD-10-CM | POA: Diagnosis not present

## 2019-12-18 DIAGNOSIS — N183 Chronic kidney disease, stage 3 unspecified: Secondary | ICD-10-CM | POA: Diagnosis not present

## 2019-12-18 DIAGNOSIS — F0390 Unspecified dementia without behavioral disturbance: Secondary | ICD-10-CM | POA: Diagnosis not present

## 2019-12-27 ENCOUNTER — Emergency Department (HOSPITAL_COMMUNITY): Payer: Medicare HMO

## 2019-12-27 ENCOUNTER — Observation Stay (HOSPITAL_COMMUNITY): Payer: Medicare HMO

## 2019-12-27 ENCOUNTER — Inpatient Hospital Stay (HOSPITAL_COMMUNITY)
Admission: EM | Admit: 2019-12-27 | Discharge: 2020-01-02 | DRG: 100 | Disposition: A | Discharge status: Skilled Nursing Facility | Payer: Medicare HMO | Attending: Internal Medicine | Admitting: Internal Medicine

## 2019-12-27 ENCOUNTER — Encounter (HOSPITAL_COMMUNITY): Payer: Self-pay | Admitting: Internal Medicine

## 2019-12-27 DIAGNOSIS — I361 Nonrheumatic tricuspid (valve) insufficiency: Secondary | ICD-10-CM | POA: Diagnosis not present

## 2019-12-27 DIAGNOSIS — R569 Unspecified convulsions: Secondary | ICD-10-CM | POA: Diagnosis not present

## 2019-12-27 DIAGNOSIS — Z79899 Other long term (current) drug therapy: Secondary | ICD-10-CM

## 2019-12-27 DIAGNOSIS — F17210 Nicotine dependence, cigarettes, uncomplicated: Secondary | ICD-10-CM | POA: Diagnosis not present

## 2019-12-27 DIAGNOSIS — D72829 Elevated white blood cell count, unspecified: Secondary | ICD-10-CM | POA: Diagnosis present

## 2019-12-27 DIAGNOSIS — R410 Disorientation, unspecified: Secondary | ICD-10-CM | POA: Diagnosis not present

## 2019-12-27 DIAGNOSIS — Z7401 Bed confinement status: Secondary | ICD-10-CM | POA: Diagnosis not present

## 2019-12-27 DIAGNOSIS — I160 Hypertensive urgency: Secondary | ICD-10-CM | POA: Diagnosis present

## 2019-12-27 DIAGNOSIS — I69993 Ataxia following unspecified cerebrovascular disease: Secondary | ICD-10-CM

## 2019-12-27 DIAGNOSIS — F0280 Dementia in other diseases classified elsewhere without behavioral disturbance: Secondary | ICD-10-CM | POA: Diagnosis present

## 2019-12-27 DIAGNOSIS — R4182 Altered mental status, unspecified: Secondary | ICD-10-CM

## 2019-12-27 DIAGNOSIS — Z20822 Contact with and (suspected) exposure to covid-19: Secondary | ICD-10-CM | POA: Diagnosis not present

## 2019-12-27 DIAGNOSIS — E162 Hypoglycemia, unspecified: Secondary | ICD-10-CM | POA: Diagnosis not present

## 2019-12-27 DIAGNOSIS — G40901 Epilepsy, unspecified, not intractable, with status epilepticus: Principal | ICD-10-CM | POA: Diagnosis present

## 2019-12-27 DIAGNOSIS — M6281 Muscle weakness (generalized): Secondary | ICD-10-CM | POA: Diagnosis not present

## 2019-12-27 DIAGNOSIS — G9341 Metabolic encephalopathy: Secondary | ICD-10-CM | POA: Diagnosis not present

## 2019-12-27 DIAGNOSIS — I5042 Chronic combined systolic (congestive) and diastolic (congestive) heart failure: Secondary | ICD-10-CM | POA: Diagnosis not present

## 2019-12-27 DIAGNOSIS — I130 Hypertensive heart and chronic kidney disease with heart failure and stage 1 through stage 4 chronic kidney disease, or unspecified chronic kidney disease: Secondary | ICD-10-CM | POA: Diagnosis not present

## 2019-12-27 DIAGNOSIS — Z7982 Long term (current) use of aspirin: Secondary | ICD-10-CM

## 2019-12-27 DIAGNOSIS — I671 Cerebral aneurysm, nonruptured: Secondary | ICD-10-CM | POA: Diagnosis not present

## 2019-12-27 DIAGNOSIS — J90 Pleural effusion, not elsewhere classified: Secondary | ICD-10-CM | POA: Diagnosis not present

## 2019-12-27 DIAGNOSIS — I10 Essential (primary) hypertension: Secondary | ICD-10-CM | POA: Diagnosis not present

## 2019-12-27 DIAGNOSIS — I340 Nonrheumatic mitral (valve) insufficiency: Secondary | ICD-10-CM | POA: Diagnosis not present

## 2019-12-27 DIAGNOSIS — I69391 Dysphagia following cerebral infarction: Secondary | ICD-10-CM | POA: Diagnosis not present

## 2019-12-27 DIAGNOSIS — I672 Cerebral atherosclerosis: Secondary | ICD-10-CM | POA: Diagnosis not present

## 2019-12-27 DIAGNOSIS — N1832 Chronic kidney disease, stage 3b: Secondary | ICD-10-CM | POA: Diagnosis present

## 2019-12-27 DIAGNOSIS — R609 Edema, unspecified: Secondary | ICD-10-CM | POA: Diagnosis not present

## 2019-12-27 DIAGNOSIS — I69351 Hemiplegia and hemiparesis following cerebral infarction affecting right dominant side: Secondary | ICD-10-CM | POA: Diagnosis not present

## 2019-12-27 DIAGNOSIS — I517 Cardiomegaly: Secondary | ICD-10-CM | POA: Diagnosis not present

## 2019-12-27 DIAGNOSIS — I351 Nonrheumatic aortic (valve) insufficiency: Secondary | ICD-10-CM | POA: Diagnosis not present

## 2019-12-27 DIAGNOSIS — R4701 Aphasia: Secondary | ICD-10-CM | POA: Diagnosis present

## 2019-12-27 DIAGNOSIS — I69393 Ataxia following cerebral infarction: Secondary | ICD-10-CM | POA: Diagnosis not present

## 2019-12-27 DIAGNOSIS — R1312 Dysphagia, oropharyngeal phase: Secondary | ICD-10-CM | POA: Diagnosis not present

## 2019-12-27 DIAGNOSIS — I428 Other cardiomyopathies: Secondary | ICD-10-CM | POA: Diagnosis present

## 2019-12-27 DIAGNOSIS — R404 Transient alteration of awareness: Secondary | ICD-10-CM | POA: Diagnosis not present

## 2019-12-27 DIAGNOSIS — G309 Alzheimer's disease, unspecified: Secondary | ICD-10-CM | POA: Diagnosis present

## 2019-12-27 DIAGNOSIS — I616 Nontraumatic intracerebral hemorrhage, multiple localized: Secondary | ICD-10-CM | POA: Diagnosis not present

## 2019-12-27 DIAGNOSIS — E875 Hyperkalemia: Secondary | ICD-10-CM | POA: Diagnosis not present

## 2019-12-27 DIAGNOSIS — M109 Gout, unspecified: Secondary | ICD-10-CM | POA: Diagnosis present

## 2019-12-27 DIAGNOSIS — I6522 Occlusion and stenosis of left carotid artery: Secondary | ICD-10-CM | POA: Diagnosis not present

## 2019-12-27 DIAGNOSIS — I6782 Cerebral ischemia: Secondary | ICD-10-CM | POA: Diagnosis not present

## 2019-12-27 DIAGNOSIS — G40909 Epilepsy, unspecified, not intractable, without status epilepticus: Secondary | ICD-10-CM | POA: Diagnosis not present

## 2019-12-27 DIAGNOSIS — M2550 Pain in unspecified joint: Secondary | ICD-10-CM | POA: Diagnosis not present

## 2019-12-27 DIAGNOSIS — G319 Degenerative disease of nervous system, unspecified: Secondary | ICD-10-CM | POA: Diagnosis not present

## 2019-12-27 DIAGNOSIS — I6389 Other cerebral infarction: Secondary | ICD-10-CM | POA: Diagnosis not present

## 2019-12-27 DIAGNOSIS — R4020 Unspecified coma: Secondary | ICD-10-CM | POA: Diagnosis not present

## 2019-12-27 DIAGNOSIS — G40919 Epilepsy, unspecified, intractable, without status epilepticus: Secondary | ICD-10-CM | POA: Diagnosis not present

## 2019-12-27 LAB — CBC WITH DIFFERENTIAL/PLATELET
Abs Immature Granulocytes: 0.05 10*3/uL (ref 0.00–0.07)
Basophils Absolute: 0 10*3/uL (ref 0.0–0.1)
Basophils Relative: 0 %
Eosinophils Absolute: 0 10*3/uL (ref 0.0–0.5)
Eosinophils Relative: 0 %
HCT: 37.5 % (ref 36.0–46.0)
Hemoglobin: 11.6 g/dL — ABNORMAL LOW (ref 12.0–15.0)
Immature Granulocytes: 1 %
Lymphocytes Relative: 6 %
Lymphs Abs: 0.6 10*3/uL — ABNORMAL LOW (ref 0.7–4.0)
MCH: 31.8 pg (ref 26.0–34.0)
MCHC: 30.9 g/dL (ref 30.0–36.0)
MCV: 102.7 fL — ABNORMAL HIGH (ref 80.0–100.0)
Monocytes Absolute: 0.4 10*3/uL (ref 0.1–1.0)
Monocytes Relative: 4 %
Neutro Abs: 9.6 10*3/uL — ABNORMAL HIGH (ref 1.7–7.7)
Neutrophils Relative %: 89 %
Platelets: 239 10*3/uL (ref 150–400)
RBC: 3.65 MIL/uL — ABNORMAL LOW (ref 3.87–5.11)
RDW: 17.4 % — ABNORMAL HIGH (ref 11.5–15.5)
WBC: 10.7 10*3/uL — ABNORMAL HIGH (ref 4.0–10.5)
nRBC: 0 % (ref 0.0–0.2)

## 2019-12-27 LAB — COMPREHENSIVE METABOLIC PANEL
ALT: 13 U/L (ref 0–44)
AST: 23 U/L (ref 15–41)
Albumin: 4.4 g/dL (ref 3.5–5.0)
Alkaline Phosphatase: 59 U/L (ref 38–126)
Anion gap: 14 (ref 5–15)
BUN: 28 mg/dL — ABNORMAL HIGH (ref 8–23)
CO2: 20 mmol/L — ABNORMAL LOW (ref 22–32)
Calcium: 10 mg/dL (ref 8.9–10.3)
Chloride: 107 mmol/L (ref 98–111)
Creatinine, Ser: 1.68 mg/dL — ABNORMAL HIGH (ref 0.44–1.00)
GFR, Estimated: 31 mL/min — ABNORMAL LOW (ref >60)
Glucose, Bld: 70 mg/dL (ref 70–99)
Potassium: 5.2 mmol/L — ABNORMAL HIGH (ref 3.5–5.1)
Sodium: 141 mmol/L (ref 135–145)
Total Bilirubin: 0.8 mg/dL (ref 0.3–1.2)
Total Protein: 8.2 g/dL — ABNORMAL HIGH (ref 6.5–8.1)

## 2019-12-27 LAB — URINALYSIS, ROUTINE W REFLEX MICROSCOPIC
Bilirubin Urine: NEGATIVE
Glucose, UA: NEGATIVE mg/dL
Ketones, ur: NEGATIVE mg/dL
Leukocytes,Ua: NEGATIVE
Nitrite: NEGATIVE
Protein, ur: 100 mg/dL — AB
Specific Gravity, Urine: 1.01 (ref 1.005–1.030)
pH: 6 (ref 5.0–8.0)

## 2019-12-27 LAB — CBG MONITORING, ED: Glucose-Capillary: 92 mg/dL (ref 70–99)

## 2019-12-27 LAB — RESP PANEL BY RT-PCR (FLU A&B, COVID) ARPGX2
Influenza A by PCR: NEGATIVE
Influenza B by PCR: NEGATIVE
SARS Coronavirus 2 by RT PCR: NEGATIVE

## 2019-12-27 LAB — MAGNESIUM: Magnesium: 2.3 mg/dL (ref 1.7–2.4)

## 2019-12-27 LAB — CK: Total CK: 140 U/L (ref 38–234)

## 2019-12-27 MED ORDER — LORAZEPAM 2 MG/ML IJ SOLN
2.0000 mg | Freq: Once | INTRAMUSCULAR | Status: AC
  Administered 2019-12-27: 2 mg via INTRAVENOUS
  Filled 2019-12-27: qty 1

## 2019-12-27 MED ORDER — LEVETIRACETAM IN NACL 1000 MG/100ML IV SOLN
1000.0000 mg | Freq: Once | INTRAVENOUS | Status: AC
  Administered 2019-12-27: 1000 mg via INTRAVENOUS
  Filled 2019-12-27: qty 100

## 2019-12-27 MED ORDER — HYDRALAZINE HCL 20 MG/ML IJ SOLN: 10.0000 mg | Freq: Four times a day (QID) | INTRAMUSCULAR | Status: DC | PRN

## 2019-12-27 MED ORDER — ASPIRIN 300 MG RE SUPP
300.0000 mg | Freq: Every day | RECTAL | Status: DC
  Administered 2019-12-28 – 2019-12-30 (×4): 300 mg via RECTAL
  Filled 2019-12-27 (×4): qty 1

## 2019-12-27 MED ORDER — STROKE: EARLY STAGES OF RECOVERY BOOK
Freq: Once | Status: AC
  Filled 2019-12-27: qty 1

## 2019-12-27 MED ORDER — ACETAMINOPHEN 650 MG RE SUPP: 650.0000 mg | RECTAL | Status: DC | PRN

## 2019-12-27 MED ORDER — ASPIRIN 325 MG PO TABS
325.0000 mg | ORAL_TABLET | Freq: Every day | ORAL | Status: DC
  Administered 2019-12-31 – 2020-01-02 (×3): 325 mg via ORAL
  Filled 2019-12-27 (×3): qty 1

## 2019-12-27 MED ORDER — SODIUM CHLORIDE 0.9 % IV SOLN: INTRAVENOUS | Status: DC

## 2019-12-27 MED ORDER — SODIUM CHLORIDE 0.9 % IV BOLUS
1000.0000 mL | Freq: Once | INTRAVENOUS | Status: AC
  Administered 2019-12-27: 1000 mL via INTRAVENOUS

## 2019-12-27 MED ORDER — ACETAMINOPHEN 160 MG/5ML PO SOLN: 650.0000 mg | ORAL | Status: DC | PRN

## 2019-12-27 MED ORDER — LORAZEPAM 2 MG/ML IJ SOLN
INTRAMUSCULAR | Status: AC
  Filled 2019-12-27: qty 1

## 2019-12-27 MED ORDER — ACETAMINOPHEN 325 MG PO TABS
650.0000 mg | ORAL_TABLET | ORAL | Status: DC | PRN
  Administered 2019-12-31 (×2): 650 mg via ORAL
  Filled 2019-12-27 (×2): qty 2

## 2019-12-27 MED ORDER — SODIUM CHLORIDE 0.9 % IV SOLN
250.0000 mg | Freq: Two times a day (BID) | INTRAVENOUS | Status: DC
  Filled 2019-12-27 (×3): qty 2.5

## 2019-12-27 MED ORDER — SODIUM CHLORIDE 0.9 % IV SOLN
100.0000 mg | Freq: Two times a day (BID) | INTRAVENOUS | Status: DC
  Administered 2019-12-28: 100 mg via INTRAVENOUS
  Filled 2019-12-27 (×4): qty 10

## 2019-12-27 MED ORDER — IOHEXOL 350 MG/ML SOLN
75.0000 mL | Freq: Once | INTRAVENOUS | Status: AC | PRN
  Administered 2019-12-27: 75 mL via INTRAVENOUS

## 2019-12-27 NOTE — ED Notes (Signed)
Pt to CT

## 2019-12-27 NOTE — H&P (Signed)
History and Physical    [name] PRX:[id number] DOB: 1940/03/22 DOA: 12/27/2019  PCP: [name], MD  Patient coming from: Home   Chief Complaint:   Witnessed seizure like activity  HPI:    79 year old female with past medical history of systolic and diastolic congestive heart failure (echo 2014 EF 35-40%), nonischemic cardiomyopathy, seizure disorder, chronic kidney disease stage IIIb, previous stroke (2014), gout, hypertension, and Alzheimer's dementia who presents to [facility] emergency department via EMS after being found with suspected seizure activity by her significant other.  Patient is unable to provide a history due to essentially unresponsive state.  During the history has been obtained from the significant other via phone conversation.  Patient has been acting normally for the past several days.  Fact, patient prepared the Thanksgiving meal yesterday.  Patient denied any symptoms at the time and has been known to be taking her medications including her Vimpat and Keppra regularly.  Patient's significant other called the patient's house this morning at approximately 11 AM but got no answer which is atypical.  He attempted to call her again around noon but again got no answer.  He then proceeded to come to her home and arrived at approximately 1:30 PM.  He found her lying in her bed "shaking, like a seizure."  He states that she was "drooling or even foaming at the mouth" and stated that her bed was soaked with her saliva.  She was actively having witnessed seizure-like activity for approximately another 10 minutes after the significant other's arrival.  He proceeded to call 911 but the seizure-like activity stopped prior to their arrival.  In route to [facility] emergency department, patient was administered 5 mg of IV midazolam.  Upon evaluation in the emergency department patient was administered 1 g of intravenous Keppra as well as an  additional dose of intravenous lorazepam.  Dr. Amada Jupiter with neurology was consulted due to concerns for seizure.  CT imaging of the head was performed revealing no acute disease.  Urinalysis and chest x-ray was also performed revealing no evidence of infection.  The hospitalist group was then called to assess the patient for admission to the hospital.  Review of Systems:   Review of Systems  Unable to perform ROS: Patient unresponsive    Past Medical History:  Diagnosis Date  . Abdominal pain 02/24/2013  . Acute combined systolic and diastolic congestive heart failure (HCC) 02/24/2013  . Acute on chronic renal insufficiency 05/15/2015  . Acute renal failure (HCC) 12/28/2012  . Acute renal failure superimposed on stage 3 chronic kidney disease (HCC)   . Anemia 05/20/2015  . 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 Basal ganglia infarction (HCC) 01/08/2013  . CAP (community acquired pneumonia)   . Chest pain 05/14/2015  . Chronic combined systolic and diastolic CHF (congestive heart failure) (HCC) 03/06/2014  . Congestive heart failure (CHF) (HCC)   . Constipation 02/24/2013  . Cough   . Diastolic dysfunction-grade 3 05/20/2015  . Dyspnea 02/24/2013  . Encephalopathy 12/28/2012  . Epilepsy (HCC)   . Generalized seizure (HCC) 12/28/2012  . H/O: CVA (cerebrovascular accident) 01/04/2013  . Hyperammonemia (HCC) 05/17/2015  . Hyperglycemia 02/24/2013  . Hyperkalemia 02/11/2013  . Hypertension   . 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 Hyponatremia 02/11/2013  . Influenza A 02/11/2013  . Liver enzyme elevation 02/11/2013  . Metabolic acidosis 12/28/2012  . Metabolic encephalopathy 05/16/2015  . NICM (nonischemic cardiomyopathy) -EF 25% 05/20/2015  .  Pleuritic chest pain 02/24/2013  . PNA (pneumonia) 05/20/2015  . Pneumobilia 02/12/2013  . Pneumonia   . Seizure (HCC) 12/28/2012  . Seizures (HCC)   . Smoker   . Stroke (HCC)   . Troponin level elevated 05/14/2015   . Volume overload 02/24/2013  . Weight gain 02/24/2013    Past Surgical History:  Procedure Laterality Date  . NO PAST SURGERIES       reports that she has been smoking cigarettes. She has been smoking about 0.25 packs per day. She has never used smokeless tobacco. She reports current alcohol use. She reports that she does not use drugs.  No Known Allergies  Family History  Problem Relation Age of Onset  . Cancer Father   . Anemia Daughter      Prior to Admission medications   Medication Sig Start Date End Date Taking? Authorizing Provider  allopurinol (ZYLOPRIM) 100 MG tablet Take 50 mg by mouth daily.    Yes [provider]  amLODipine (NORVASC) 10 MG tablet Take 10 mg by mouth daily. 07/19/19  Yes [provider]  aspirin 81 MG EC tablet Take 81 mg by mouth every morning.    Yes [provider]  calcium carbonate (OSCAL) 1500 (600 Ca) MG TABS tablet Take 1,500 mg by mouth every evening.  07/26/19  Yes [provider]  carvedilol (COREG) 12.5 MG tablet Take 12.5 mg by mouth 2 (two) times daily with a meal.   Yes [provider]  docusate sodium (COLACE) 100 MG capsule Take 100-300 mg by mouth daily.   Yes [provider]  donepezil (ARICEPT) 5 MG tablet Take 5 mg by mouth at bedtime. 07/02/19  Yes [provider]  FEROSUL 325 (65 Fe) MG tablet Take 325 mg by mouth every morning. 07/26/19  Yes [provider]  levETIRAcetam (KEPPRA) 500 MG tablet TAKE 1/2 TABLET BY MOUTH TWICE DAILY Patient taking differently: Take 250 mg by mouth 2 (two) times daily.  09/02/19  Yes Butch Penny, NP  losartan (COZAAR) 100 MG tablet Take 100 mg by mouth every morning. 07/18/19  Yes [provider]  sodium bicarbonate 650 MG tablet Take 1,300 mg by mouth 2 (two) times daily. 04/15/19  Yes [provider]  VIMPAT 100 MG TABS Take 1 tablet by mouth twice a day as directed by physician. Patient taking differently: Take  100 mg by mouth in the morning and at bedtime.  10/17/19  Yes Dohmeier, Porfirio Mylar, MD  furosemide (LASIX) 20 MG tablet Take 1 tablet (20 mg total) by mouth daily. Patient not taking: Reported on 12/27/2019 05/23/15   Rodolph Bong, MD    Physical Exam: Vitals:   12/27/19 2045 12/27/19 2100 12/27/19 2115 12/27/19 2230  BP: (!) 187/97 (!) 181/108 (!) 176/121 (!) 175/110  Pulse: 86 85 97 89  Resp: 14 14 15 18   Temp:      TempSrc:      SpO2: 99% 100% 100% 100%    Constitutional: Minimally responsive and nonverbal, seemingly only responsive to painful stimuli.  Patient is currently not in acute distress. Skin: no rashes, no lesions somewhat poor skin turgor noted. Eyes: Pupils are equally reactive to light.  Increased conjunctival pallor noted without scleral icterus. ENMT: Slightly dry mucous membranes noted.  Posterior pharynx clear of any exudate or lesions.   Neck: normal, supple, no masses, no thyromegaly.  No evidence of jugular venous distension.   Respiratory: clear to auscultation bilaterally, no wheezing, no crackles. Normal respiratory  effort. No accessory muscle use.  Cardiovascular: Regular rate and rhythm, no murmurs / rubs / gallops. No extremity edema. 2+ pedal pulses. No carotid bruits.  Chest:   Nontender without crepitus or deformity.   Back:   Nontender without crepitus or deformity. Abdomen: Abdomen is soft and nontender.  No evidence of intra-abdominal masses.  Positive bowel sounds noted in all quadrants.   Musculoskeletal: No joint deformity upper and lower extremities. Good ROM, no contractures. Normal muscle tone.  Neurologic: Minimally responsive, only to pain and nonlocalizing.  Patient is spontaneously moving her left upper and left lower extremity but is exhibiting no movement of the right upper or lower extremity.  Notable right lateral gaze of both eyes.  Patient is not following commands.   Psychiatric: Unable to assess due to minimal responsiveness.  Patient  currently does not seem to possess insight as to her current situation.  Labs on Admission: I have personally reviewed following labs and imaging studies -   CBC: Recent Labs  Lab 12/27/19 1439  WBC 10.7*  NEUTROABS 9.6*  HGB 11.6*  HCT 37.5  MCV 102.7*  PLT 239   Basic Metabolic Panel: Recent Labs  Lab 12/27/19 1439  NA 141  K 5.2*  CL 107  CO2 20*  GLUCOSE 70  BUN 28*  CREATININE 1.68*  CALCIUM 10.0  MG 2.3   GFR: CrCl cannot be calculated (Unknown ideal weight.). Liver Function Tests: Recent Labs  Lab 12/27/19 1439  AST 23  ALT 13  ALKPHOS 59  BILITOT 0.8  PROT 8.2*  ALBUMIN 4.4   No results for input(s): LIPASE, AMYLASE in the last 168 hours. No results for input(s): AMMONIA in the last 168 hours. Coagulation Profile: No results for input(s): INR, PROTIME in the last 168 hours. Cardiac Enzymes: Recent Labs  Lab 12/27/19 1439  CKTOTAL 140   BNP (last 3 results) No results for input(s): PROBNP in the last 8760 hours. HbA1C: No results for input(s): HGBA1C in the last 72 hours. CBG: Recent Labs  Lab 12/27/19 1433  GLUCAP 92   Lipid Profile: No results for input(s): CHOL, HDL, LDLCALC, TRIG, CHOLHDL, LDLDIRECT in the last 72 hours. Thyroid Function Tests: No results for input(s): TSH, T4TOTAL, FREET4, T3FREE, THYROIDAB in the last 72 hours. Anemia Panel: No results for input(s): VITAMINB12, FOLATE, FERRITIN, TIBC, IRON, RETICCTPCT in the last 72 hours. Urine analysis:    Component Value Date/Time   COLORURINE STRAW (A) 12/27/2019 1707   APPEARANCEUR CLEAR 12/27/2019 1707   LABSPEC 1.010 12/27/2019 1707   PHURINE 6.0 12/27/2019 1707   GLUCOSEU NEGATIVE 12/27/2019 1707   HGBUR SMALL (A) 12/27/2019 1707   BILIRUBINUR NEGATIVE 12/27/2019 1707   KETONESUR NEGATIVE 12/27/2019 1707   PROTEINUR 100 (A) 12/27/2019 1707   UROBILINOGEN 0.2 03/05/2014 1324   NITRITE NEGATIVE 12/27/2019 1707   LEUKOCYTESUR NEGATIVE 12/27/2019 1707     Radiological Exams on Admission - Personally Reviewed: CT Head Wo Contrast  Result Date: 12/27/2019 CLINICAL DATA:  Delirium. EXAM: CT HEAD WITHOUT CONTRAST TECHNIQUE: Contiguous axial images were obtained from the base of the skull through the vertex without intravenous contrast. COMPARISON:  MRI July 29 21, CT head July 23, 2018. FINDINGS: Brain: No evidence of acute large vascular territory infarction, hemorrhage, hydrocephalus, extra-axial collection or mass lesion/mass effect. Moderate to advanced scattered patchy hypodensity throughout the white matter, compatible with chronic microvascular ischemic disease. Similar remote lacunar infarcts involving bilateral basal ganglia and right thalamus. Similar high paramedian left frontal cortical infarct. Generalized  atrophy. Vascular: Calcific atherosclerosis. Skull: No acute fracture. Sinuses/Orbits: No acute finding. Other: No mastoid effusions. IMPRESSION: 1. No evidence of acute intracranial abnormality. 2. Similar moderate to advanced chronic microvascular ischemic disease and remote infarcts. Electronically Signed   By: Feliberto Harts MD   On: 12/27/2019 15:40   DG Chest Port 1 View  Result Date: 12/27/2019 CLINICAL DATA:  Unresponsive EXAM: PORTABLE CHEST 1 VIEW COMPARISON:  11/17/2017, 05/19/2015 FINDINGS: No focal opacity or pleural effusion. Mild cardiomegaly. Enlarged mediastinal silhouette, likely augmented by patient rotation. IMPRESSION: Mild cardiomegaly. Enlarged mediastinal silhouette, likely augmented by patient rotation. Electronically Signed   By: Jasmine Pang M.D.   On: 12/27/2019 15:10    EKG: Personally reviewed.  Rhythm is sinus tachycardia with heart rate of 109 bpm.  Evidence of left bundle branch block.   no dynamic ST segment changes appreciated.  Assessment/Plan Principal Problem:   Witnessed seizure-like activity (HCC)   Notable seizure activity for nearly 10 minutes as noted by the patient's significant other  in the setting of known seizure disorder  Patient does regularly follow with [facility] and reportedly is compliant with her medications  History is seemingly consistent with seizure, however on examination patient is not moving her right upper or lower extremities with a right lateral gaze all concerning for acute stroke.  Case has been discussed with Dr. Amada Jupiter with neurology, his input is appreciated.  He is recommending CT angiogram of the head and neck.  If there is evidence of an acute stroke based on the CTA, we will pursue permissive hypertension and stroke work-up.  If CTA reveals no evidence of stroke, then patient will undergo continuous EEG monitoring.  Patient has already been loaded with intravenous Keppra here in the emergency department.  We will transition patient to intravenous versions of patient's maintenance Vimpat and Keppra going forward unless otherwise directed by neurology.  Serial neurologic checks  Echocardiogram ordered for the morning  Patient will be kept n.p.o. for now.  Per rectal aspirin daily   Await neurology's note and further recommendations.  Active Problems:   Hyperkalemia   Mild hyperkalemia without EKG changes  Expect that this mild hyperkalemia will resolve spontaneously with gentle hydration  Monitoring patient on telemetry  Serial chemistries to monitor potassium levels    Chronic combined systolic and diastolic congestive heart failure (HCC)   No evidence of cardiogenic volume overload at this time  Very gentle intravenous hydration to avoid volume overload    Chronic kidney disease, stage 3b (HCC)   Renal function near baseline  Strict input and output monitoring  Monitoring renal function and electrolytes with serial chemistries    Alzheimer's dementia without behavioral disturbance (HCC)   Holding home regimen of Aricept for patient is in current state and n.p.o.    Leukocytosis   Urinalysis and chest x-ray revealed no evidence of pneumonia or urinary tract infection  No obvious clinical evidence of meningitis  Will monitor with serial CBCs.    Gout  Holding home regimen of allopurinol  Hypertensive urgency   Permissive hypertension for now until CVA has been ruled out per my discussion with neurology.  Code Status:  Full code Family Communication: Case has been discussed with both patient's daughter in person as well as patient's significant other who have both been updated on plan of care.  Status is: Observation  The patient remains OBS appropriate and will d/c before 2 midnights.  Dispo: The patient is from: Home  Anticipated d/c is to: Home              Anticipated d/c date is: 2 days              Patient currently is not medically stable to d/c.        Marinda Elk MD [facility] Pager [phone]  If 7PM-7AM, please contact night-coverage [email or web address] Use universal [place] password for that web site. If you do not have the password, please call the hospital operator.  12/27/2019, 11:04 PM

## 2019-12-27 NOTE — Consult Note (Addendum)
Neurology Consultation Reason for Consult: Altered mental status Referring Physician: Little, R  CC: Altered mental status  History is obtained from: Daughter, significant other  HPI: [name] is a 79 y.o. female who was last known in her normal state of health when she had a phone conversation with a friend at 60 PM on 11/25.  Subsequently, she was not answering her phone earlier today and therefore her significant other went over to check on her and found her to be having " full on convulsions" which she described as generalized shaking and frothing of the mouth.  She received midazolam by EMS and was loaded with Keppra 1 g here in the emergency department.  Seizures stopped, but she remained confused, and despite multiple hours and becoming more alert, she did not come back to baseline and therefore neurology was consulted.  Of note, her spouse notes that she was really sweaty and therefore he suspects that she was seizing for quite some time prior to being found.  Last seizure was approximately 5 months ago.  LKW: 11pm tpa given?: no, out of window.    ROS: Unable to obtain due to altered mental status.   Past Medical History:  Diagnosis Date  . Abdominal pain 02/24/2013  . Acute combined systolic and diastolic congestive heart failure (HCC) 02/24/2013  . Acute on chronic renal insufficiency 05/15/2015  . Acute renal failure (HCC) 12/28/2012  . Acute renal failure superimposed on stage 3 chronic kidney disease (HCC)   . Anemia 05/20/2015  . 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 Basal ganglia infarction (HCC) 01/08/2013  . CAP (community acquired pneumonia)   . Chest pain 05/14/2015  . Chronic combined systolic and diastolic CHF (congestive heart failure) (HCC) 03/06/2014  . Congestive heart failure (CHF) (HCC)   . Constipation 02/24/2013  . Cough   . Diastolic dysfunction-grade 3 05/20/2015  . Dyspnea 02/24/2013  . Encephalopathy 12/28/2012  .  Epilepsy (HCC)   . Generalized seizure (HCC) 12/28/2012  . H/O: CVA (cerebrovascular accident) 01/04/2013  . Hyperammonemia (HCC) 05/17/2015  . Hyperglycemia 02/24/2013  . Hyperkalemia 02/11/2013  . Hypertension   . 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 Hyponatremia 02/11/2013  . Influenza A 02/11/2013  . Liver enzyme elevation 02/11/2013  . Metabolic acidosis 12/28/2012  . Metabolic encephalopathy 05/16/2015  . NICM (nonischemic cardiomyopathy) -EF 25% 05/20/2015  . Pleuritic chest pain 02/24/2013  . PNA (pneumonia) 05/20/2015  . Pneumobilia 02/12/2013  . Pneumonia   . Seizure (HCC) 12/28/2012  . Seizures (HCC)   . Smoker   . Stroke (HCC)   . Troponin level elevated 05/14/2015  . Volume overload 02/24/2013  . Weight gain 02/24/2013     Family History  Problem Relation Age of Onset  . Cancer Father   . Anemia Daughter      Social History:  reports that she has been smoking cigarettes. She has been smoking about 0.25 packs per day. She has never used smokeless tobacco. She reports current alcohol use. She reports that she does not use drugs.   Exam: Current vital signs: BP (!) 176/121   Pulse 97   Temp 100.2 F (37.9 C) (Rectal)   Resp 15   SpO2 100%  Vital signs in last 24 hours: Temp:  [98.2 F (36.8 C)-100.2 F (37.9 C)] 100.2 F (37.9 C) (11/26 1646) Pulse Rate:  [31-108] 97 (11/26 2115) Resp:  [14-22] 15 (11/26 2115) BP: (154-187)/(74-141) 176/121 (11/26 2115) SpO2:  [89 %-100 %]  100 % (11/26 2115)   Physical Exam  Constitutional: Appears well-developed and well-nourished.  Psych: Affect appropriate to situation Eyes: No scleral injection HENT: No OP obstrucion MSK: no joint deformities.  Cardiovascular: Normal rate and regular rhythm.  Respiratory: Effort normal, non-labored breathing GI: Soft.  No distension. There is no tenderness.  Skin: WDI  Neuro: Mental Status: Patient is awake, alert, She mumbles unintelligible speech whne asked  questions, does not follow commands.  Cranial Nerves: II: Blinks to threat bilaterally.  Pupils are equal, round, and reactive to light.   III,IV, VI: EOMI, tracks across midline in both directions.  V: Facial sensation is symmetric to temperature VII: Facial movement with ? Decreased NL fold on the LEFT Motor: She has decreased tone on th eright, with less movement on the right arm and leg compeared to the left, though she does move them.  Sensory: She withdraws to noxious stimulation x 4. Deep Tendon Reflexes: Cerebellar: Does not perform.   I have reviewed labs in epic and the results pertinent to this consultation are: Cr 1.68, baseline 1.5 GFR 31 Calcium 10.0, sodium 141 Magnesium 2.3 UA is negative  I have reviewed the images obtained:CT head - negative.   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  He has persistent negative deficits which are concerning for possible ongoing partial seizures.  I will be slightly more aggressive with her antiepileptics, though it is not clear if she was compliant or not given that she managed her medicines.  Stroke would be another possibility, but she is out of the TPA window.  I will get a CTA to make sure she does not have a large vessel occlusion, and if we do not get an answer from this, then pursue a stat EEG.  Recommendations: 1) CTA head neck 2) stat EEG if negative 3) continue Keppra and Vimpat 4) neurology to follow.  Ritta Slot, MD [facility] [phone]  If 7pm- 7am, please page neurology on call as listed in AMION.

## 2019-12-27 NOTE — ED Provider Notes (Signed)
I received this patient in signout from Dr. Adela Lank.  Briefly, pt presented w/ seizure activity and AMS, known sz hx on medication.  At time of signout, patient awaiting head CT and observation to see if she returned to neurologic baseline.  CT negative acute.  Lab work shows no acute infectious process electrolyte derangement to explain patient's altered mentation.  Patient has become more alert after several hours in the ED but is still not conversant and not following commands.  Daughter states this is very different from her baseline.  Consulted neurology, Dr. Amada Jupiter, who will evaluate the patient.  Discussed admission with [facility] hospitalist, Dr. Leafy Half.   [name], Ambrose Finland, MD 12/27/19 2258

## 2019-12-27 NOTE — ED Triage Notes (Signed)
Pt BIB EMS for possible seizure. Family tried to call her for around a hour, no response, went to house and found pt in bed unresponsive, drooling, and "tight"/contracted. Was given 5 of midazolam in EMS. Patient is usually AOx4.  Pt has a hx of stroke and seizure. Stroke in 2014 and last seizure was 2018.  Pt unresponsive now.  VS in EMS  170/80 HR 130 O2 - 98 on non rebreather  CBG 110

## 2019-12-27 NOTE — ED Provider Notes (Signed)
[facility] EMERGENCY DEPARTMENT Provider Note   CSN: [id number] Arrival date & time: 12/27/19  1430     History Chief Complaint  Patient presents with  . Seizures    Unresponsive     [name] is a 79 y.o. female.  79 yo F with a chief complaints of unresponsiveness.  Family had not been able to speak with her in the past couple hours.  Found her contracted, similar to when she had seizures in the past.  No recent illness.  No missed medications.  Level 5 caveat unresponsive  The history is provided by the patient.  Seizures Seizure activity on arrival: yes   Seizure type:  Grand mal Initial focality:  None Postictal symptoms: somnolence   Return to baseline: no   Severity:  Mild Duration:  2 hours Timing:  Once Progression:  Unchanged Recent head injury:  No recent head injuries PTA treatment:  Midazolam (5mg )      Past Medical History:  Diagnosis Date  . Abdominal pain 02/24/2013  . Acute combined systolic and diastolic congestive heart failure (HCC) 02/24/2013  . Acute on chronic renal insufficiency 05/15/2015  . Acute renal failure (HCC) 12/28/2012  . Acute renal failure superimposed on stage 3 chronic kidney disease (HCC)   . Anemia 05/20/2015  . 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 Basal ganglia infarction (HCC) 01/08/2013  . CAP (community acquired pneumonia)   . Chest pain 05/14/2015  . Chronic combined systolic and diastolic CHF (congestive heart failure) (HCC) 03/06/2014  . Congestive heart failure (CHF) (HCC)   . Constipation 02/24/2013  . Cough   . Diastolic dysfunction-grade 3 05/20/2015  . Dyspnea 02/24/2013  . Encephalopathy 12/28/2012  . Epilepsy (HCC)   . Generalized seizure (HCC) 12/28/2012  . H/O: CVA (cerebrovascular accident) 01/04/2013  . Hyperammonemia (HCC) 05/17/2015  . Hyperglycemia 02/24/2013  . Hyperkalemia 02/11/2013  . Hypertension   . 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 Hyponatremia 02/11/2013  . Influenza A 02/11/2013  . Liver enzyme elevation 02/11/2013  . Metabolic acidosis 12/28/2012  . Metabolic encephalopathy 05/16/2015  . NICM (nonischemic cardiomyopathy) -EF 25% 05/20/2015  . Pleuritic chest pain 02/24/2013  . PNA (pneumonia) 05/20/2015  . Pneumobilia 02/12/2013  . Pneumonia   . Seizure (HCC) 12/28/2012  . Seizures (HCC)   . Smoker   . Stroke (HCC)   . Troponin level elevated 05/14/2015  . Volume overload 02/24/2013  . Weight gain 02/24/2013    Patient Active Problem List   Diagnosis Date Noted  . Cough   . Encounter for palliative care   . Goals of care, counseling/discussion   . Pneumonia   . NICM (nonischemic cardiomyopathy) -EF 25% 05/20/2015  . No significant CAD at cath May 2012 05/20/2015  . Anemia 05/20/2015  . Diastolic dysfunction-grade 3 05/20/2015  . PNA (pneumonia) 05/20/2015  . Acute renal failure superimposed on stage 3 chronic kidney disease (HCC)   . CAP (community acquired pneumonia)   . Hyperammonemia (HCC) 05/17/2015  . Metabolic encephalopathy 05/16/2015  . Acute on chronic renal insufficiency 05/15/2015  . Chest pain 05/14/2015  . Troponin level elevated 05/14/2015  . Chronic combined systolic and diastolic CHF (congestive heart failure) (HCC) 03/06/2014  . Acute combined systolic and diastolic congestive heart failure (HCC) 02/24/2013  . Weight gain 02/24/2013  . Volume overload 02/24/2013  . Hyperglycemia 02/24/2013  . Dyspnea 02/24/2013  . Constipation 02/24/2013  . Abdominal pain 02/24/2013  . Pleuritic chest pain  02/24/2013  . Epilepsy (HCC)   . Smoker   . 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 Protein-calorie malnutrition, severe- wgt 86 lbs 02/13/2013  . Pneumobilia 02/12/2013  . Influenza A 02/11/2013  . Hyperkalemia 02/11/2013  . Hyponatremia 02/11/2013  . HCAP (healthcare-associated pneumonia) 02/11/2013  . Liver enzyme elevation 02/11/2013  . Basal ganglia infarction  (HCC) 01/08/2013  . H/O: CVA (cerebrovascular accident) 01/04/2013  . Generalized seizure (HCC) 12/28/2012  . 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 Seizure (HCC) 12/28/2012  . Metabolic acidosis 12/28/2012  . Encephalopathy 12/28/2012  . Acute renal failure (HCC) 12/28/2012    Past Surgical History:  Procedure Laterality Date  . NO PAST SURGERIES       OB History   No obstetric history on file.     Family History  Problem Relation Age of Onset  . Cancer Father   . Anemia Daughter     Social History   Tobacco Use  . Smoking status: Current Some Day Smoker    Packs/day: 0.25    Types: Cigarettes  . Smokeless tobacco: Never Used  . Tobacco comment: 1/2 caigarettes a day  Substance Use Topics  . Alcohol use: Yes    Alcohol/week: 0.0 standard drinks    Comment: occasional  . Drug use: No    Home Medications Prior to Admission medications   Medication Sig Start Date End Date Taking? Authorizing Provider  VIMPAT 100 MG TABS Take 1 tablet by mouth twice a day as directed by physician. 10/17/19   Dohmeier, Porfirio Mylar, MD  allopurinol (ZYLOPRIM) 100 MG tablet Take 100 mg by mouth daily.    [provider]  amLODipine (NORVASC) 10 MG tablet Take 10 mg by mouth daily. 07/19/19   [provider]  aspirin 81 MG EC tablet Take 81 mg by mouth every morning.     [provider]  calcium carbonate (OSCAL) 1500 (600 Ca) MG TABS tablet SMARTSIG:1 Tablet(s) By Mouth Every Evening 07/26/19   [provider]  carvedilol (COREG) 12.5 MG tablet Take 12.5 mg by mouth 2 (two) times daily with a meal.    [provider]  donepezil (ARICEPT) 5 MG tablet Take 5 mg by mouth at bedtime. 07/02/19   [provider]  FEROSUL 325 (65 Fe) MG tablet Take 325 mg by mouth every morning. 07/26/19   [provider]  furosemide (LASIX) 20 MG tablet Take 1 tablet (20 mg total) by mouth daily. 05/23/15   Rodolph Bong, MD  hyoscyamine  (LEVSIN) 0.125 MG tablet Take by mouth every 4 (four) hours as needed. 02/13/19   [provider]  levETIRAcetam (KEPPRA) 500 MG tablet TAKE 1/2 TABLET BY MOUTH TWICE DAILY 09/02/19   Butch Penny, NP  losartan (COZAAR) 100 MG tablet Take 100 mg by mouth every morning. 07/18/19   [provider]  sodium bicarbonate 650 MG tablet Take 1,300 mg by mouth 2 (two) times daily. 04/15/19   [provider]    Allergies    Patient has no known allergies.  Review of Systems   Review of Systems  Unable to perform ROS: Patient unresponsive  Constitutional: Negative for chills and fever.  HENT: Negative for congestion and rhinorrhea.   Eyes: Negative for redness and visual disturbance.  Respiratory: Negative for shortness of breath and wheezing.   Cardiovascular: Negative for chest pain and palpitations.  Gastrointestinal: Negative for nausea and vomiting.  Genitourinary: Negative for dysuria and urgency.  Musculoskeletal: Negative for  arthralgias and myalgias.  Skin: Negative for pallor and wound.  Neurological: Positive for seizures. Negative for dizziness and headaches.    Physical Exam Updated Vital Signs SpO2 90%   Physical Exam Vitals and nursing note reviewed.  Constitutional:      General: She is not in acute distress.    Appearance: She is well-developed. She is not diaphoretic.  HENT:     Head: Normocephalic and atraumatic.  Eyes:     Pupils: Pupils are equal, round, and reactive to light.  Cardiovascular:     Rate and Rhythm: Normal rate and regular rhythm.     Heart sounds: No murmur heard.  No friction rub. No gallop.   Pulmonary:     Effort: Pulmonary effort is normal.     Breath sounds: No wheezing or rales.  Abdominal:     General: There is no distension.     Palpations: Abdomen is soft.     Tenderness: There is no abdominal tenderness.  Musculoskeletal:        General: No tenderness.     Cervical back: Normal range of motion and neck  supple.  Skin:    General: Skin is warm and dry.  Neurological:     Comments: Eyes are open but deviated to the right with some rhythmic beating.  Able to range the extremities but mostly held towards the core, somewhat stiff.     ED Results / Procedures / Treatments   Labs (all labs ordered are listed, but only abnormal results are displayed) Labs Reviewed  CBC WITH DIFFERENTIAL/PLATELET  COMPREHENSIVE METABOLIC PANEL  CK  URINALYSIS, ROUTINE W REFLEX MICROSCOPIC  CBG MONITORING, ED    EKG None  Radiology No results found.  Procedures Procedures (including critical care time)  Medications Ordered in ED Medications  sodium chloride 0.9 % bolus 1,000 mL (has no administration in time range)  LORazepam (ATIVAN) injection 2 mg (has no administration in time range)    ED Course  I have reviewed the triage vital signs and the nursing notes.  Pertinent labs & imaging results that were available during my care of the patient were reviewed by me and considered in my medical decision making (see chart for details).    MDM Rules/Calculators/A&P                          79 yo F with a significant past medical history of seizures comes in with a chief complaint of unresponsiveness.  Thought to be having seizure-like activity per the family and EMS given 5 mg of Versed with no significant improvement.  Still unresponsive on arrival eyes are open but deviated to the right.  Will give 2 mg of Ativan.  Laboratory evaluation CT of the head.  No longer with right sided gaze deviation on exam.  Patient now responding to pain more on L than R.   Signed out to Dr. Clarene Duke, please see her note for further details of care in the ED.   CRITICAL CARE Performed by: Rae Roam   Total critical care time: 35 minutes  Critical care time was exclusive of separately billable procedures and treating other patients.  Critical care was necessary to treat or prevent imminent or  life-threatening deterioration.  Critical care was time spent personally by me on the following activities: development of treatment plan with patient and/or surrogate as well as nursing, discussions with consultants, evaluation of patient's response to treatment, examination of patient,  obtaining history from patient or surrogate, ordering and performing treatments and interventions, ordering and review of laboratory studies, ordering and review of radiographic studies, pulse oximetry and re-evaluation of patient's condition.   The patients results and plan were reviewed and discussed.   Any x-rays performed were independently reviewed by myself.   Differential diagnosis were considered with the presenting HPI.  Medications  acetaminophen (TYLENOL) tablet 650 mg (has no administration in time range)    Or  acetaminophen (TYLENOL) 160 MG/5ML solution 650 mg (has no administration in time range)    Or  acetaminophen (TYLENOL) suppository 650 mg (has no administration in time range)  aspirin suppository 300 mg (300 mg Rectal Given 12/29/19 0942)    Or  aspirin tablet 325 mg ( Oral See Alternative 12/29/19 0942)  hydrALAZINE (APRESOLINE) injection 10 mg (has no administration in time range)  metoprolol tartrate (LOPRESSOR) injection 5 mg (5 mg Intravenous Given 12/30/19 0607)  enalaprilat (VASOTEC) injection 1.25 mg (1.25 mg Intravenous Given 12/30/19 0607)  lacosamide (VIMPAT) 200 mg in sodium chloride 0.9 % 25 mL IVPB (0 mg Intravenous Stopped 12/29/19 2300)  dextrose 5 %-0.9 % sodium chloride infusion ( Intravenous New Bag/Given 12/30/19 0606)  dextrose 50 % solution 12.5 g (12.5 g Intravenous Not Given 12/28/19 1016)  levETIRAcetam (KEPPRA) IVPB 1000 mg/100 mL premix (0 mg Intravenous Stopped 12/29/19 2220)  sodium chloride 0.9 % bolus 1,000 mL (0 mLs Intravenous Stopped 12/27/19 1645)  LORazepam (ATIVAN) injection 2 mg (2 mg Intravenous Given 12/27/19 1457)  levETIRAcetam (KEPPRA) IVPB  1000 mg/100 mL premix (0 mg Intravenous Stopped 12/27/19 1645)  iohexol (OMNIPAQUE) 350 MG/ML injection 75 mL (75 mLs Intravenous Contrast Given 12/27/19 2241)   stroke: mapping our early stages of recovery book ( Does not apply Given 12/28/19 1016)  lacosamide (VIMPAT) 200 mg in sodium chloride 0.9 % 25 mL IVPB (200 mg Intravenous New Bag/Given 12/28/19 0212)  LORazepam (ATIVAN) injection 1 mg (1 mg Intravenous Given 12/28/19 0305)  dextrose 50 % solution (50 mLs  Given 12/28/19 0905)    Vitals:   12/30/19 0315 12/30/19 0400 12/30/19 0500 12/30/19 0600  BP: (!) 148/106     Pulse:  74 76 69  Resp: 17 14 15 16   Temp: 97.7 F (36.5 C)     TempSrc: Oral     SpO2: 96% 99% 99% 96%  Weight:      Height:        Final diagnoses:  Seizure (HCC)  Altered mental status, unspecified altered mental status type    Admission/ observation were discussed with the admitting physician, patient and/or family and they are comfortable with the plan.   Final Clinical Impression(s) / ED Diagnoses Final diagnoses:  None    Rx / DC Orders ED Discharge Orders    None       , DO 12/30/19 01/01/20

## 2019-12-28 ENCOUNTER — Observation Stay (HOSPITAL_COMMUNITY): Payer: Medicare HMO

## 2019-12-28 DIAGNOSIS — F17210 Nicotine dependence, cigarettes, uncomplicated: Secondary | ICD-10-CM | POA: Diagnosis not present

## 2019-12-28 DIAGNOSIS — I340 Nonrheumatic mitral (valve) insufficiency: Secondary | ICD-10-CM | POA: Diagnosis not present

## 2019-12-28 DIAGNOSIS — R569 Unspecified convulsions: Secondary | ICD-10-CM | POA: Diagnosis not present

## 2019-12-28 DIAGNOSIS — I69993 Ataxia following unspecified cerebrovascular disease: Secondary | ICD-10-CM | POA: Diagnosis not present

## 2019-12-28 DIAGNOSIS — G309 Alzheimer's disease, unspecified: Secondary | ICD-10-CM | POA: Diagnosis present

## 2019-12-28 DIAGNOSIS — I351 Nonrheumatic aortic (valve) insufficiency: Secondary | ICD-10-CM

## 2019-12-28 DIAGNOSIS — R609 Edema, unspecified: Secondary | ICD-10-CM | POA: Diagnosis not present

## 2019-12-28 DIAGNOSIS — Z79899 Other long term (current) drug therapy: Secondary | ICD-10-CM | POA: Diagnosis not present

## 2019-12-28 DIAGNOSIS — I428 Other cardiomyopathies: Secondary | ICD-10-CM | POA: Diagnosis present

## 2019-12-28 DIAGNOSIS — M109 Gout, unspecified: Secondary | ICD-10-CM | POA: Diagnosis present

## 2019-12-28 DIAGNOSIS — F0280 Dementia in other diseases classified elsewhere without behavioral disturbance: Secondary | ICD-10-CM | POA: Diagnosis present

## 2019-12-28 DIAGNOSIS — I361 Nonrheumatic tricuspid (valve) insufficiency: Secondary | ICD-10-CM

## 2019-12-28 DIAGNOSIS — E875 Hyperkalemia: Secondary | ICD-10-CM | POA: Diagnosis present

## 2019-12-28 DIAGNOSIS — I5042 Chronic combined systolic (congestive) and diastolic (congestive) heart failure: Secondary | ICD-10-CM | POA: Diagnosis present

## 2019-12-28 DIAGNOSIS — I6782 Cerebral ischemia: Secondary | ICD-10-CM | POA: Diagnosis not present

## 2019-12-28 DIAGNOSIS — R4182 Altered mental status, unspecified: Secondary | ICD-10-CM | POA: Diagnosis present

## 2019-12-28 DIAGNOSIS — Z7982 Long term (current) use of aspirin: Secondary | ICD-10-CM | POA: Diagnosis not present

## 2019-12-28 DIAGNOSIS — Z20822 Contact with and (suspected) exposure to covid-19: Secondary | ICD-10-CM | POA: Diagnosis present

## 2019-12-28 DIAGNOSIS — G319 Degenerative disease of nervous system, unspecified: Secondary | ICD-10-CM | POA: Diagnosis not present

## 2019-12-28 DIAGNOSIS — G40901 Epilepsy, unspecified, not intractable, with status epilepticus: Secondary | ICD-10-CM | POA: Diagnosis not present

## 2019-12-28 DIAGNOSIS — G9341 Metabolic encephalopathy: Secondary | ICD-10-CM | POA: Diagnosis not present

## 2019-12-28 DIAGNOSIS — I6389 Other cerebral infarction: Secondary | ICD-10-CM

## 2019-12-28 DIAGNOSIS — I160 Hypertensive urgency: Secondary | ICD-10-CM | POA: Diagnosis present

## 2019-12-28 DIAGNOSIS — E162 Hypoglycemia, unspecified: Secondary | ICD-10-CM | POA: Diagnosis not present

## 2019-12-28 DIAGNOSIS — I130 Hypertensive heart and chronic kidney disease with heart failure and stage 1 through stage 4 chronic kidney disease, or unspecified chronic kidney disease: Secondary | ICD-10-CM | POA: Diagnosis present

## 2019-12-28 DIAGNOSIS — R4701 Aphasia: Secondary | ICD-10-CM | POA: Diagnosis not present

## 2019-12-28 DIAGNOSIS — N1832 Chronic kidney disease, stage 3b: Secondary | ICD-10-CM | POA: Diagnosis present

## 2019-12-28 DIAGNOSIS — I616 Nontraumatic intracerebral hemorrhage, multiple localized: Secondary | ICD-10-CM | POA: Diagnosis not present

## 2019-12-28 LAB — ECHOCARDIOGRAM COMPLETE BUBBLE STUDY
Calc EF: 42.6 %
S' Lateral: 2.4 cm
Single Plane A2C EF: 42.6 %
Single Plane A4C EF: 40.5 %

## 2019-12-28 LAB — TROPONIN I (HIGH SENSITIVITY)
Troponin I (High Sensitivity): 1220 ng/L (ref <18)
Troponin I (High Sensitivity): 926 ng/L (ref <18)
Troponin I (High Sensitivity): 398 ng/L (ref <18)

## 2019-12-28 LAB — GLUCOSE, CAPILLARY
Glucose-Capillary: 135 mg/dL — ABNORMAL HIGH (ref 70–99)
Glucose-Capillary: 63 mg/dL — ABNORMAL LOW (ref 70–99)
Glucose-Capillary: 106 mg/dL — ABNORMAL HIGH (ref 70–99)
Glucose-Capillary: 87 mg/dL (ref 70–99)

## 2019-12-28 LAB — LIPID PANEL
Cholesterol: 189 mg/dL (ref 0–200)
HDL: 87 mg/dL (ref >40)
LDL Cholesterol: 85 mg/dL (ref 0–99)
Total CHOL/HDL Ratio: 2.2 RATIO
Triglycerides: 84 mg/dL (ref <150)
VLDL: 17 mg/dL (ref 0–40)

## 2019-12-28 LAB — COMPREHENSIVE METABOLIC PANEL
ALT: 15 U/L (ref 0–44)
AST: 33 U/L (ref 15–41)
Albumin: 3.9 g/dL (ref 3.5–5.0)
Alkaline Phosphatase: 57 U/L (ref 38–126)
Anion gap: 14 (ref 5–15)
BUN: 25 mg/dL — ABNORMAL HIGH (ref 8–23)
CO2: 19 mmol/L — ABNORMAL LOW (ref 22–32)
Calcium: 9.4 mg/dL (ref 8.9–10.3)
Chloride: 105 mmol/L (ref 98–111)
Creatinine, Ser: 1.41 mg/dL — ABNORMAL HIGH (ref 0.44–1.00)
GFR, Estimated: 38 mL/min — ABNORMAL LOW (ref >60)
Glucose, Bld: 66 mg/dL — ABNORMAL LOW (ref 70–99)
Potassium: 4.3 mmol/L (ref 3.5–5.1)
Sodium: 138 mmol/L (ref 135–145)
Total Bilirubin: 0.8 mg/dL (ref 0.3–1.2)
Total Protein: 7.2 g/dL (ref 6.5–8.1)

## 2019-12-28 LAB — PROTIME-INR
INR: 1.1 (ref 0.8–1.2)
Prothrombin Time: 13.5 seconds (ref 11.4–15.2)

## 2019-12-28 LAB — APTT: aPTT: 29 seconds (ref 24–36)

## 2019-12-28 LAB — HEMOGLOBIN A1C
Hgb A1c MFr Bld: 5.4 % (ref 4.8–5.6)
Mean Plasma Glucose: 108.28 mg/dL

## 2019-12-28 LAB — BRAIN NATRIURETIC PEPTIDE: B Natriuretic Peptide: 1391.1 pg/mL — ABNORMAL HIGH (ref 0.0–100.0)

## 2019-12-28 MED ORDER — ENALAPRILAT 1.25 MG/ML IV SOLN
1.2500 mg | Freq: Four times a day (QID) | INTRAVENOUS | Status: DC
  Administered 2019-12-28 – 2019-12-30 (×11): 1.25 mg via INTRAVENOUS
  Filled 2019-12-28 (×12): qty 1

## 2019-12-28 MED ORDER — DEXTROSE 50 % IV SOLN
INTRAVENOUS | Status: AC
  Administered 2019-12-28: 50 mL
  Filled 2019-12-28: qty 50

## 2019-12-28 MED ORDER — HYDRALAZINE HCL 20 MG/ML IJ SOLN
10.0000 mg | Freq: Four times a day (QID) | INTRAMUSCULAR | Status: DC | PRN
  Filled 2019-12-28 (×2): qty 1

## 2019-12-28 MED ORDER — LEVETIRACETAM IN NACL 500 MG/100ML IV SOLN
500.0000 mg | Freq: Two times a day (BID) | INTRAVENOUS | Status: DC
  Administered 2019-12-28 (×2): 500 mg via INTRAVENOUS
  Filled 2019-12-28 (×3): qty 100

## 2019-12-28 MED ORDER — SODIUM CHLORIDE 0.9 % IV SOLN
200.0000 mg | Freq: Two times a day (BID) | INTRAVENOUS | Status: DC
  Administered 2019-12-28 – 2019-12-31 (×7): 200 mg via INTRAVENOUS
  Filled 2019-12-28 (×9): qty 20

## 2019-12-28 MED ORDER — METOPROLOL TARTRATE 5 MG/5ML IV SOLN
5.0000 mg | Freq: Four times a day (QID) | INTRAVENOUS | Status: DC
  Administered 2019-12-28 – 2019-12-30 (×11): 5 mg via INTRAVENOUS
  Filled 2019-12-28 (×11): qty 5

## 2019-12-28 MED ORDER — SODIUM CHLORIDE 0.9 % IV SOLN
200.0000 mg | INTRAVENOUS | Status: AC
  Administered 2019-12-28: 200 mg via INTRAVENOUS
  Filled 2019-12-28: qty 20

## 2019-12-28 MED ORDER — DEXTROSE 50 % IV SOLN: 12.5000 g | INTRAVENOUS | Status: AC

## 2019-12-28 MED ORDER — LORAZEPAM 2 MG/ML IJ SOLN
1.0000 mg | Freq: Once | INTRAMUSCULAR | Status: AC
  Administered 2019-12-28: 1 mg via INTRAVENOUS
  Filled 2019-12-28: qty 1

## 2019-12-28 MED ORDER — DEXTROSE-NACL 5-0.9 % IV SOLN: INTRAVENOUS | Status: DC

## 2019-12-28 NOTE — Progress Notes (Signed)
PROGRESS NOTE    [name]  KZS:[id number] DOB: 07-Dec-1940 DOA: 12/27/2019 PCP: [name], MD    Brief Narrative:  [name] is a 79 year old female with past medical history significant for seizure disorder, dementia, CKD stage IIIb, and ICM, chronic combined systolic and diastolic congestive heart failure, history of ischemic CVA 2014, gout, essential hypertension who presented to the ED via EMS following breakthrough seizure.  Patient was given IV benzodiazepines via EMS and loaded on IV Keppra in the ED.  Neurology was consulted.  CT head showed no acute intracranial abnormality and CTA head/neck with no large vessel occlusion.  EEG monitoring initiated.  Hospitalist service consulted for further evaluation and management.   Assessment & Plan:   Principal Problem:   Witnessed seizure-like activity (HCC) Active Problems:   Hyperkalemia   Chronic combined systolic and diastolic congestive heart failure (HCC)   Chronic kidney disease, stage 3b (HCC)   Alzheimer's dementia without behavioral disturbance (HCC)   Gout   Leukocytosis   Acute metabolic encephalopathy Epilepsy with breakthrough seizure Patient presenting from home after being found down with convulsive/generalized shaking seizure activity.  Was given midazolam by EMS and loaded with IV Keppra on arrival to ED.  Convulsions had subsided but continues with confusion which is far from her normal baseline.  CT head with no acute intracranial abnormality but does note moderate/advanced chronic microvascular ischemic disease and remote infarcts.  CT head/neck with no large vessel occlusion.  On continuous EEG with findings of severe cortical irritability left parieto-occipital region. --Neurology following, appreciate assistance --On continuous EEG --Keppra 500 mg BID --Vimpat 100mg  BID --plan MR brain w/o contrast once cEEG completed --Seizure precautions  Elevated troponin Patient was noted to  have an elevated troponin of 1220.  Cardiology overnight was called, suspect this is from type II demand ischemia secondary to seizure as above.  EKG with normal sinus rhythm, rate 79, QTc 536, no concerning ST elevation/depressions or T wave inversions. --Troponin 1220>926; continue to trend --Continue to monitor on telemetry  Hypoglycemia Glucose 66 this morning, likely secondary to n.p.o. status and lethargy. --D5 NS at 50 mL's per hour --Continue to closely monitor CBGs --Hypoglycemic protocol  Essential hypertension Home medications include amlodipine 10 mg p.o. daily, carvedilol 12.5 mg twice daily, losartan 100mg  p.o. daily --Currently n.p.o. due to mental status --Metoprolol 5 mg IV every 6 hours --Vasotec 1.25 mg IV every 6 hours --Hydralazine IV as needed  Chronic combined systolic and diastolic congestive heart failure, compensated Nonischemic cardiomyopathy Follows with cardiology outpatient, Dr. .  TTE 10/16/2019 with LVEF 30-35%, LV moderately decreased function with global hypokinesis, trivial MR, moderate TR, mild AR, normal IVC. --Repeat TTE: Pending --Holding home carvedilol and losartan due to lethargy and unable to tolerate oral intake currently --Continue IV metoprolol and Vasotec as above --Strict I's and O's and daily weights  CKD stage IIIb Baseline creatinine 1.5.  Creatinine on admission 1.68, at baseline.  On sodium bicarbonate outpatient. --Cr 1.68>1.41 --Continue gentle IV fluid hydration while n.p.o. --Avoid nephrotoxins, renally dose all medications --Strict I's and O's and daily weights  Alzheimer's dementia On donepezil 5 mg p.o. nightly at home. --Holding oral meds secondary to mental status --Delirium precautions --Get up during the day --Encourage a familiar face to remain present throughout the day --Keep blinds open and lights on during daylight hours --Minimize the use of opioids/benzodiazepines   DVT prophylaxis: SCDs Code  Status: Full code Family Communication: No family present at bedside this morning,  updated patient's son Taylor Maduro via telephone this morning.  Disposition Plan:  Status is: Observation  The patient will require care spanning > 2 midnights and should be moved to inpatient because: Altered mental status, Ongoing diagnostic testing needed not appropriate for outpatient work up, Unsafe d/c plan, IV treatments appropriate due to intensity of illness or inability to take PO and Inpatient level of care appropriate due to severity of illness patient currently unable to tolerate oral intake due to mental status/lethargy stemming from breakthrough seizures from underlying epilepsy.  Continues to require IV epileptics.  Dispo: The patient is from: Home              Anticipated d/c is to: To be determined              Anticipated d/c date is: 2 days              Patient currently is not medically stable to d/c.   Consultants:   Neurology  Procedures:   Continuous EEG  TTE  Antimicrobials:   None   Subjective: Patient seen and examined bedside, resting comfortably, nursing present.  Just completed TTE, continues on continuous EEG.  Patient nonverbal, not following commands appropriately.  No family present at bedside this morning.  Continues on IV antiepileptics.  Neurology recommends MR brain once EEG completed.  Unable to obtain any further ROS/history from patient due to her current lethargy and mental status.  No acute concerns per nursing staff other than mild hypoglycemia this morning, started on D5 infusion.  Objective: Vitals:   12/28/19 0600 12/28/19 0602 12/28/19 0604 12/28/19 0758  BP:    (!) 168/85  Pulse: 75 72 69 79  Resp: (!) 27 (!) 21 19 18   Temp:    98.1 F (36.7 C)  TempSrc:    Oral  SpO2: 100% 100% 100% 100%  Weight:      Height:        Intake/Output Summary (Last 24 hours) at 12/28/2019 1108 Last data filed at 12/28/2019 0600 Gross per 24 hour  Intake 363.41 ml   Output 200 ml  Net 163.41 ml   Filed Weights   12/27/19 2345  Weight: 36.4 kg    Examination:  General exam: Appears calm and comfortable, on continuous EEG Respiratory system: Clear to auscultation. Respiratory effort normal.  On 2 L nasal cannula Cardiovascular system: S1 & S2 heard, RRR. No JVD, murmurs, rubs, gallops or clicks. No pedal edema. Gastrointestinal system: Abdomen is nondistended, soft and nontender. No organomegaly or masses felt. Normal bowel sounds heard. Central nervous system: Alert.  Moving extremities independently but not to command Extremities: Symmetric 5 x 5 power. Skin: No rashes, lesions or ulcers Psychiatry: Unable to assess appropriately secondary to mental status    Data Reviewed: I have personally reviewed following labs and imaging studies  CBC: Recent Labs  Lab 12/27/19 1439  WBC 10.7*  NEUTROABS 9.6*  HGB 11.6*  HCT 37.5  MCV 102.7*  PLT 239   Basic Metabolic Panel: Recent Labs  Lab 12/27/19 1439 12/28/19 0313  NA 141 138  K 5.2* 4.3  CL 107 105  CO2 20* 19*  GLUCOSE 70 66*  BUN 28* 25*  CREATININE 1.68* 1.41*  CALCIUM 10.0 9.4  MG 2.3  --    GFR: Estimated Creatinine Clearance: 19.2 mL/min (A) (by C-G formula based on SCr of 1.41 mg/dL (H)). Liver Function Tests: Recent Labs  Lab 12/27/19 1439 12/28/19 0313  AST 23 33  ALT 13  15  ALKPHOS 59 57  BILITOT 0.8 0.8  PROT 8.2* 7.2  ALBUMIN 4.4 3.9   No results for input(s): LIPASE, AMYLASE in the last 168 hours. No results for input(s): AMMONIA in the last 168 hours. Coagulation Profile: Recent Labs  Lab 12/28/19 0313  INR 1.1   Cardiac Enzymes: Recent Labs  Lab 12/27/19 1439  CKTOTAL 140   BNP (last 3 results) No results for input(s): PROBNP in the last 8760 hours. HbA1C: Recent Labs    12/28/19 0313  HGBA1C 5.4   CBG: Recent Labs  Lab 12/27/19 1433 12/28/19 0901 12/28/19 0922  GLUCAP 92 63* 135*   Lipid Profile: Recent Labs     12/28/19 0313  CHOL 189  HDL 87  LDLCALC 85  TRIG 84  CHOLHDL 2.2   Thyroid Function Tests: No results for input(s): TSH, T4TOTAL, FREET4, T3FREE, THYROIDAB in the last 72 hours. Anemia Panel: No results for input(s): VITAMINB12, FOLATE, FERRITIN, TIBC, IRON, RETICCTPCT in the last 72 hours. Sepsis Labs: No results for input(s): PROCALCITON, LATICACIDVEN in the last 168 hours.  Recent Results (from the past 240 hour(s))  Resp Panel by RT-PCR (Flu A&B, Covid) Nasopharyngeal Swab     Status: None   Collection Time: 12/27/19  2:55 PM   Specimen: Nasopharyngeal Swab; Nasopharyngeal(NP) swabs in vial transport medium  Result Value Ref Range Status   SARS Coronavirus 2 by RT PCR NEGATIVE NEGATIVE Final    Comment: (NOTE) SARS-CoV-2 target nucleic acids are NOT DETECTED.  The SARS-CoV-2 RNA is generally detectable in upper respiratory specimens during the acute phase of infection. The lowest concentration of SARS-CoV-2 viral copies this assay can detect is 138 copies/mL. A negative result does not preclude SARS-Cov-2 infection and should not be used as the sole basis for treatment or other patient management decisions. A negative result may occur with  improper specimen collection/handling, submission of specimen other than nasopharyngeal swab, presence of viral mutation(s) within the areas targeted by this assay, and inadequate number of viral copies(<138 copies/mL). A negative result must be combined with clinical observations, patient history, and epidemiological information. The expected result is Negative.  Fact Sheet for Patients:  BloggerCourse.com  Fact Sheet for Healthcare Providers:  SeriousBroker.it  This test is no t yet approved or cleared by the [place] FDA and  has been authorized for detection and/or diagnosis of SARS-CoV-2 by FDA under an Emergency Use Authorization (EUA). This EUA will remain  in effect  (meaning this test can be used) for the duration of the COVID-19 declaration under Section 564(b)(1) of the Act, 21 U.S.C.section 360bbb-3(b)(1), unless the authorization is terminated  or revoked sooner.       Influenza A by PCR NEGATIVE NEGATIVE Final   Influenza B by PCR NEGATIVE NEGATIVE Final    Comment: (NOTE) The Xpert Xpress SARS-CoV-2/FLU/RSV plus assay is intended as an aid in the diagnosis of influenza from Nasopharyngeal swab specimens and should not be used as a sole basis for treatment. Nasal washings and aspirates are unacceptable for Xpert Xpress SARS-CoV-2/FLU/RSV testing.  Fact Sheet for Patients: BloggerCourse.com  Fact Sheet for Healthcare Providers: SeriousBroker.it  This test is not yet approved or cleared by the [place] FDA and has been authorized for detection and/or diagnosis of SARS-CoV-2 by FDA under an Emergency Use Authorization (EUA). This EUA will remain in effect (meaning this test can be used) for the duration of the COVID-19 declaration under Section 564(b)(1) of the Act, 21 U.S.C. section 360bbb-3(b)(1), unless the authorization  is terminated or revoked.  Performed at [facility], [address], [place], [place] [address]          Radiology Studies: CT ANGIO HEAD W OR WO CONTRAST  Result Date: 12/27/2019 CLINICAL DATA:  Initial evaluation for acute stroke. EXAM: CT ANGIOGRAPHY HEAD AND NECK TECHNIQUE: Multidetector CT imaging of the head and neck was performed using the standard protocol during bolus administration of intravenous contrast. Multiplanar CT image reconstructions and MIPs were obtained to evaluate the vascular anatomy. Carotid stenosis measurements (when applicable) are obtained utilizing NASCET criteria, using the distal internal carotid diameter as the denominator. CONTRAST:  58mL OMNIPAQUE IOHEXOL 350 MG/ML SOLN COMPARISON:  Prior head CT from earlier same day.  FINDINGS: CTA NECK FINDINGS Aortic arch: Visualized aortic arch of normal caliber with normal branch pattern. Mild atheromatous change about the aortic arch. No hemodynamically significant stenosis seen about the origin of the great vessels. Right carotid system: Right common and internal carotid arteries are tortuous but widely patent without stenosis, dissection or occlusion. Left carotid system: Left CCA tortuous but widely patent to the bifurcation without stenosis. Atheromatous narrowing at the origin of the left ICA measuring up to 35% by NASCET criteria. Left ICA tortuous but otherwise patent distally to the skull base without stenosis, dissection or occlusion. Vertebral arteries: Both vertebral arteries arise from the subclavian arteries. No proximal subclavian artery stenosis. Right vertebral artery dominant. Vertebral arteries mildly tortuous but patent without stenosis, dissection or occlusion. Skeleton: No acute osseous abnormality. No discrete or worrisome osseous lesions. Other neck: No other acute soft tissue abnormality within the neck. No mass or adenopathy. Upper chest: Small layering bilateral pleural effusions partially visualized. Emphysematous changes noted within the visualized lungs. Review of the MIP images confirms the above findings CTA HEAD FINDINGS Anterior circulation: Petrous segments patent bilaterally. Scattered atheromatous plaque throughout the carotid siphons with no more than mild multifocal stenosis. 3 mm aneurysm extending medially and slightly posteriorly seen arising from the cavernous right ICA (series 7, image 106). Additional subtle 3 mm outpouching extending inferiorly and slightly medially from the cavernous left ICA also suspicious for aneurysm (series 8, image 89). ICA termini well perfused. A1 segments patent bilaterally. Normal anterior communicating artery complex. Anterior cerebral arteries patent to their distal aspects without stenosis. Atheromatous irregularity  within the M1 segments without high-grade stenosis or occlusion. Normal MCA bifurcations. Distal MCA branches well perfused, although demonstrate moderate small vessel atheromatous irregularity. Posterior circulation: Both vertebral arteries patent to the vertebrobasilar junction without stenosis. Both PICA patent. Basilar patent to its distal aspect without stenosis. Superior cerebral arteries patent bilaterally. 3 mm aneurysm seen extending superiorly and slightly posteriorly from the basilar tip (series 7, image 99). Both PCAs primarily supplied via the basilar. Scattered atheromatous irregularity throughout the PCA segments bilaterally without high-grade or flow-limiting stenosis. Both PCAs remain well perfused to their distal aspects. Venous sinuses: Patent. Anatomic variants: None significant. Review of the MIP images confirms the above findings IMPRESSION: 1. Negative CTA for large vessel occlusion. 2. 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 3. Diffuse tortuosity of the major arterial vasculature of the head and neck, suggesting chronic underlying hypertension. 4. 3 mm bilateral cavernous ICA aneurysms, with additional 3 mm basilar tip aneurysm. 5. Small layering bilateral pleural effusions. 6. Emphysema (ICD10-J43.9). Electronically Signed   By: Rise Mu M.D.   On: 12/27/2019 23:11   CT Head Wo Contrast  Result Date: 12/27/2019 CLINICAL DATA:  Delirium. EXAM: CT  HEAD WITHOUT CONTRAST TECHNIQUE: Contiguous axial images were obtained from the base of the skull through the vertex without intravenous contrast. COMPARISON:  MRI July 29 21, CT head July 23, 2018. FINDINGS: Brain: No evidence of acute large vascular territory infarction, hemorrhage, hydrocephalus, extra-axial collection or mass lesion/mass effect. Moderate to advanced scattered patchy hypodensity throughout the white matter, compatible with chronic microvascular ischemic disease. Similar remote lacunar infarcts  involving bilateral basal ganglia and right thalamus. Similar high paramedian left frontal cortical infarct. Generalized atrophy. Vascular: Calcific atherosclerosis. Skull: No acute fracture. Sinuses/Orbits: No acute finding. Other: No mastoid effusions. IMPRESSION: 1. No evidence of acute intracranial abnormality. 2. Similar moderate to advanced chronic microvascular ischemic disease and remote infarcts. Electronically Signed   By: Feliberto Harts MD   On: 12/27/2019 15:40   CT ANGIO NECK W OR WO CONTRAST  Result Date: 12/27/2019 CLINICAL DATA:  Initial evaluation for acute stroke. EXAM: CT ANGIOGRAPHY HEAD AND NECK TECHNIQUE: Multidetector CT imaging of the head and neck was performed using the standard protocol during bolus administration of intravenous contrast. Multiplanar CT image reconstructions and MIPs were obtained to evaluate the vascular anatomy. Carotid stenosis measurements (when applicable) are obtained utilizing NASCET criteria, using the distal internal carotid diameter as the denominator. CONTRAST:  75mL OMNIPAQUE IOHEXOL 350 MG/ML SOLN COMPARISON:  Prior head CT from earlier same day. FINDINGS: CTA NECK FINDINGS Aortic arch: Visualized aortic arch of normal caliber with normal branch pattern. Mild atheromatous change about the aortic arch. No hemodynamically significant stenosis seen about the origin of the great vessels. Right carotid system: Right common and internal carotid arteries are tortuous but widely patent without stenosis, dissection or occlusion. Left carotid system: Left CCA tortuous but widely patent to the bifurcation without stenosis. Atheromatous narrowing at the origin of the left ICA measuring up to 35% by NASCET criteria. Left ICA tortuous but otherwise patent distally to the skull base without stenosis, dissection or occlusion. Vertebral arteries: Both vertebral arteries arise from the subclavian arteries. No proximal subclavian artery stenosis. Right vertebral artery  dominant. Vertebral arteries mildly tortuous but patent without stenosis, dissection or occlusion. Skeleton: No acute osseous abnormality. No discrete or worrisome osseous lesions. Other neck: No other acute soft tissue abnormality within the neck. No mass or adenopathy. Upper chest: Small layering bilateral pleural effusions partially visualized. Emphysematous changes noted within the visualized lungs. Review of the MIP images confirms the above findings CTA HEAD FINDINGS Anterior circulation: Petrous segments patent bilaterally. Scattered atheromatous plaque throughout the carotid siphons with no more than mild multifocal stenosis. 3 mm aneurysm extending medially and slightly posteriorly seen arising from the cavernous right ICA (series 7, image 106). Additional subtle 3 mm outpouching extending inferiorly and slightly medially from the cavernous left ICA also suspicious for aneurysm (series 8, image 89). ICA termini well perfused. A1 segments patent bilaterally. Normal anterior communicating artery complex. Anterior cerebral arteries patent to their distal aspects without stenosis. Atheromatous irregularity within the M1 segments without high-grade stenosis or occlusion. Normal MCA bifurcations. Distal MCA branches well perfused, although demonstrate moderate small vessel atheromatous irregularity. Posterior circulation: Both vertebral arteries patent to the vertebrobasilar junction without stenosis. Both PICA patent. Basilar patent to its distal aspect without stenosis. Superior cerebral arteries patent bilaterally. 3 mm aneurysm seen extending superiorly and slightly posteriorly from the basilar tip (series 7, image 99). Both PCAs primarily supplied via the basilar. Scattered atheromatous irregularity throughout the PCA segments bilaterally without high-grade or flow-limiting stenosis. Both PCAs remain well perfused to  their distal aspects. Venous sinuses: Patent. Anatomic variants: None significant. Review of  the MIP images confirms the above findings IMPRESSION: 1. Negative CTA for large vessel occlusion. 2. 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 3. Diffuse tortuosity of the major arterial vasculature of the head and neck, suggesting chronic underlying hypertension. 4. 3 mm bilateral cavernous ICA aneurysms, with additional 3 mm basilar tip aneurysm. 5. Small layering bilateral pleural effusions. 6. Emphysema (ICD10-J43.9). Electronically Signed   By: Rise Mu M.D.   On: 12/27/2019 23:11   DG Chest Port 1 View  Result Date: 12/27/2019 CLINICAL DATA:  Unresponsive EXAM: PORTABLE CHEST 1 VIEW COMPARISON:  11/17/2017, 05/19/2015 FINDINGS: No focal opacity or pleural effusion. Mild cardiomegaly. Enlarged mediastinal silhouette, likely augmented by patient rotation. IMPRESSION: Mild cardiomegaly. Enlarged mediastinal silhouette, likely augmented by patient rotation. Electronically Signed   By: Jasmine Pang M.D.   On: 12/27/2019 15:10   EEG adult  Result Date: 12/28/2019 Rejeana Brock, MD     12/28/2019  2:34 AM Patient Name: [name] MRN: [id number] EEG Attending: Ritta Slot Referring Physician/Provider: Ritta Slot Date: 12/28/2019 Duration: 22 minutes Patient history: 79 yo F with persistent deficits after status epilepticus Level of alertness: Awake, Asleep AEDs during EEG study: Keppra, Vimpat Technical aspects: This EEG study was done with scalp electrodes positioned according to the 10-20 International system of electrode placement. Electrical activity was acquired at a sampling rate of  and reviewed with a high frequency filter of  and a low frequency filter of . EEG data were recorded continuously and digitally stored. BACKGROUND ACTIVITY: Posterior dominant rhythm: There is a poorly sustained posterior dominant rhythm of 10 Hz seen at times. Slowing: Generalized irregular delta and theta range activity are present throughout  the study. There is also focal irregular left posterior quadrant slow activity. EPILEPTIFORM ACTIVITY: Interictal epileptiform activity: Lateralized periodic discharges(LPDs) with spike or polyspike morphology, sometimes with accompanying slow wave are seen at P7 > P3,O1 with a frequency of 1 Hz. Ictal Activity: None OTHER EVENTS: None ACTIVATION PROCEDURES: Hyperventilation and photic stimulation were not performed. IMPRESSION: This study recorded a pattern consistent with severe cortical irritability in the left parieto-occipital region. There was no definite seizure recorded, though the pattern does lie on the ictal-interictal continuum. Ritta Slot, MD [facility] [phone] If 7pm- 7am, please page neurology on call as listed in AMION.   Overnight EEG with video  Result Date: 12/28/2019 Charlsie Quest, MD     12/28/2019 10:58 AM Patient Name: [name] MRN: [id number] Epilepsy Attending: Charlsie Quest Referring Physician/Provider: Dr Ritta Slot Duration: 12/28/2019 0140 to 12/28/2019 1045 Patient history:  79 yo F with persistent deficits after status epilepticus. EEG to evaluate for seizure Level of alertness: Awake, asleep AEDs during EEG study: LEV, LM Technical aspects: This EEG study was done with scalp electrodes positioned according to the 10-20 International system of electrode placement. Electrical activity was acquired at a sampling rate of  and reviewed with a high frequency filter of  and a low frequency filter of . EEG data were recorded continuously and digitally stored. Description: The posterior dominant rhythm consists of 10 Hz activity of moderate voltage (25-35 uV) seen predominantly in posterior head regions, symmetric and reactive to eye opening and eye closing.  Sleep was characterized by vertex waves, sleep spindles (12 to 14 Hz), maximal frontocentral region.  EEG showed continuous generalized and maximal left posterior  quadrant 3 to 6 Hz theta-delta  slowing. Lateralized periodic discharges with spike or polyspike morphology were seen in left posterior quadrant, maximal P7 > P3,O1, at 0.25-1hz .  Hyperventilation and photic stimulation were not performed.   ABNORMALITY -Continuous slow, generalized and maximal left posterior quadrant - Lateralized epileptiform discharges, left posterior quadrant IMPRESSION: This study showed evidence of epileptogenicity as well as cortical dysfunction arising from left posterior quadrant likely due to underlying structural abnormality. The LPDs area seen at 0.25-1hz  which is on the ictal-interictal continuum with higher potential for seizures.  Additionally there is mild to moderate diffuse encephalopathy, non specific etiology. No seizures were seen throughout the recording. Priyanka Annabelle Harman        Scheduled Meds: . aspirin  300 mg Rectal Daily   Or  . aspirin  325 mg Oral Daily  . dextrose  12.5 g Intravenous STAT  . enalaprilat  1.25 mg Intravenous Q6H  . metoprolol tartrate  5 mg Intravenous Q6H   Continuous Infusions: . dextrose 5 % and 0.9% NaCl 50 mL/hr at 12/28/19 0914  . lacosamide (VIMPAT) IV    . levETIRAcetam 500 mg (12/28/19 1022)     LOS: 0 days    Time spent: 39 minutes spent on chart review, discussion with nursing staff, consultants, updating family and interview/physical exam; more than 50% of that time was spent in counseling and/or coordination of care.    Alvira Philips [place], DO [facility] Available via Epic secure chat 7am-7pm After these hours, please refer to coverage provider listed on [email or web address] 12/28/2019, 11:08 AM

## 2019-12-28 NOTE — Progress Notes (Signed)
  Echocardiogram 2D Echocardiogram with bubble has been performed.  Roosvelt Maser F 12/28/2019, 9:03 AM

## 2019-12-28 NOTE — Progress Notes (Signed)
LTM EEG hooked up and running - no initial skin breakdown - push button tested - neuro notified.same leads used  [facility] monitor  

## 2019-12-28 NOTE — Progress Notes (Signed)
SLP Cancellation Note  Patient Details Name: Takasha Vetere MRN: [id number] DOB: 06-13-1940   Cancelled treatment:       Reason Eval/Treat Not Completed: Fatigue/lethargy limiting ability to participate;  Nursing deferred; pt extremely lethargic; on continuous EEG.  ST will continue efforts.   Tressie Stalker, M.S., CCC-SLP 12/28/2019, 9:13 AM

## 2019-12-28 NOTE — Procedures (Signed)
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 Electrical activity was acquired at a sampling rate of 500Hz  and reviewed with a high frequency filter of 70Hz  and a low frequency filter of 1Hz . EEG data were recorded continuously and digitally stored.   BACKGROUND ACTIVITY: Posterior dominant rhythm: There is a poorly sustained posterior dominant rhythm of 10 Hz seen at times.   Slowing: Generalized irregular delta and theta range activity are present throughout the study. There is also focal irregular left posterior quadrant slow activity.   EPILEPTIFORM ACTIVITY: Interictal epileptiform activity: Lateralized periodic discharges(LPDs) with spike or polyspike morphology, sometimes with accompanying slow wave are seen at P7 > P3,O1 with a frequency of 1 Hz.   Ictal Activity: None  OTHER EVENTS: None  ACTIVATION PROCEDURES:  Hyperventilation and photic stimulation were not performed.  IMPRESSION: This study recorded a pattern consistent with severe cortical irritability in the left parieto-occipital region. There was no definite seizure recorded, though the pattern does lie on the ictal-interictal continuum.   , MD [facility] [phone]  If 7pm- 7am, please page neurology on call as listed in AMION.

## 2019-12-28 NOTE — Progress Notes (Signed)
Was lethargic/postictal much of the shift and started to become more with it and alert in the later evening.  Daughter at her side.  Patient did start to become irritable with this nurse when care given.

## 2019-12-28 NOTE — Progress Notes (Signed)
LTM maint complete - no skin breakdown under:  A1 F7,F3,Fp1 [facility], event button tested and verified.

## 2019-12-28 NOTE — Progress Notes (Signed)
CRITICAL VALUE ALERT  Critical Value:  Troponin=1220, BNP= 1391.1  Date & Time Notied:  12/28/19 0022  Provider Notified: Shalhoub  Orders Received/Actions taken: see orders (stat EKG, add'l Troponin)

## 2019-12-28 NOTE — Progress Notes (Signed)
STAT EEG complete - results pending.LTM to follow same leads used

## 2019-12-28 NOTE — Progress Notes (Signed)
PT Cancellation Note  Patient Details Name: [name] MRN: [id number] DOB: 08/16/1940   Cancelled Treatment:    Reason Eval/Treat Not Completed: Patient at procedure or test/unavailable patient still on continuous EEG. Will attempt to return if time/schedule allow and she is available.    Madelaine Etienne, DPT, PN1   Supplemental Physical Therapist [facility]    Pager [phone] Acute Rehab Office [phone]

## 2019-12-28 NOTE — Procedures (Addendum)
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 EEG to evaluate for seizure  Level of alertness: Awake, asleep  AEDs during EEG study: LEV, LM  Technical aspects: This EEG study was done with scalp electrodes positioned according to the 10-20 International system of electrode placement. Electrical activity was acquired at a sampling rate of 500Hz  and reviewed with a high frequency filter of 70Hz  and a low frequency filter of 1Hz . EEG data were recorded continuously and digitally stored.   Description: The posterior dominant rhythm consists of 10 Hz activity of moderate voltage (25-35 uV) seen predominantly in posterior head regions, symmetric and reactive to eye opening and eye closing.  Sleep was characterized by vertex waves, sleep spindles (12 to 14 Hz), maximal frontocentral region.  EEG showed continuous generalized and maximal left posterior quadrant 3 to 6 Hz theta-delta slowing. Lateralized periodic discharges with spike or polyspike morphology were seen in left posterior quadrant, maximal P7 > P3,O1, at 0.25-1hz .  Hyperventilation and photic stimulation were not performed.     ABNORMALITY -Continuous slow, generalized and maximal left posterior quadrant - Lateralized epileptiform discharges, left posterior quadrant  IMPRESSION: This study showed evidence of epileptogenicity as well as cortical dysfunction arising from left posterior quadrant likely due to underlying structural abnormality. The LPDs area seen at 0.25-1hz  which is on the ictal-interictal continuum with higher potential for seizures.  Additionally there is mild to moderate diffuse encephalopathy, non specific etiology. No seizures were seen throughout the recording.   [name] 

## 2019-12-28 NOTE — Progress Notes (Signed)
OT Cancellation Note  Patient Details Name: Graceanne Guin MRN: [id number] DOB: November 29, 1940   Cancelled Treatment:    Reason Eval/Treat Not Completed: Patient at procedure or test/ unavailable. Pt on continuous EEG, will re-check on pt at later date.  Ignacia Palma, OTR/L Acute Rehab Services Pager [phone] Office [phone]     Evette Georges 12/28/2019, 9:30 AM

## 2019-12-28 NOTE — Progress Notes (Signed)
NEUROLOGY CONSULTATION PROGRESS NOTE   Date of service: December 28, 2019 Patient Name: [name] MRN:  [id number] DOB:  [birth year]  Brief HPI  [name] is a 79 y.o. female wp/w found down with full on convulsions with generalized shaking and frothing of the mouth. Given Midazolam by EMS and loaded with Keppra. Convulsions stopped but she was noted to be confused and not back to her baseline. STAT cEEG with PLEDS +, now on Keppra and Vimpat. CTH and CTA negative for a large territory stroke, no ICH, no LVO.   Interval Hx   Eyes open. She is mumbling, moving all extremities spotnaeously but definitely moves LUE and LLE more than the RUE and RLE. Unable to provide any meaningful hx.  Vitals   Vitals:   12/28/19 0500 12/28/19 0600 12/28/19 0602 12/28/19 0604  BP:      Pulse: 88 75 72 69  Resp: 15 (!) 27 (!) 21 19  Temp:      TempSrc:      SpO2: 100% 100% 100% 100%  Weight:      Height:         Body mass index is 15.16 kg/m.  Physical Exam   General: Laying comfortably in bed; in no acute distress. HENT: Normal oropharynx and mucosa. Normal external appearance of ears and nose. Neck: Supple, no pain or tenderness CV: No JVD. No peripheral edema. Pulmonary: Symmetric Chest rise. Normal respiratory effort. Abdomen: Soft to touch, non-tender. Ext: No cyanosis, edema, or deformity Skin: No rash. Normal palpation of skin.  Musculoskeletal: Normal digits and nails by inspection. No clubbing.  Neurologic Examination  Mental status/Cognition: opens eyes to voice, mumbling, doe snot follow commands. Maintains very brief eye contact. Speech/language: non sensical speech. Cranial nerves:   CN II Pupils equal and reactive to light   CN III,IV,VI Looks let and right, no obvious nystagmus or gaze preference.   CN V corneals + bilaterally   CN VII Symmetric grimace.   CN VIII Looks towards voice.   CN IX & X    CN XI    CN XII    Motor:  Muscle bulk:  poor, tone normal, tremor none Unable to do detailed strength testing due to her AMS and poor participation. She localizes in LUE and LLE but not with RUE and RLE. Reflexes:  Right Left Comments  Pectoralis      Biceps (C5/6) 2 2   Brachioradialis (C5/6) 1 1    Triceps (C6/7) 1 1    Patellar (L3/4) 1 1    Achilles (S1)      Hoffman      Plantar withdraws withdraws   Jaw jerk    Sensation:  Light touch Grimace to noxious stimuli in all extremities.   Pin prick    Temperature    Vibration   Proprioception    Coordination/Complex Motor:  Unable to assesss but no obvious tremor.  Labs   Basic Metabolic Panel:  Lab Results  Component Value Date   NA 138 12/28/2019   K 4.3 12/28/2019   CO2 19 (L) 12/28/2019   GLUCOSE 66 (L) 12/28/2019   BUN 25 (H) 12/28/2019   CREATININE 1.41 (H) 12/28/2019   CALCIUM 9.4 12/28/2019   GFRNONAA 38 (L) 12/28/2019   GFRAA 38 (L) 07/23/2018   HbA1c:  Lab Results  Component Value Date   HGBA1C 5.4 12/28/2019   LDL:  Lab Results  Component Value Date   LDLCALC 85 12/28/2019   Urine  Drug Screen:     Component Value Date/Time   LABOPIA NONE DETECTED 03/05/2014 1324   COCAINSCRNUR NONE DETECTED 03/05/2014 1324   LABBENZ NONE DETECTED 03/05/2014 1324   AMPHETMU NONE DETECTED 03/05/2014 1324   THCU NONE DETECTED 03/05/2014 1324   LABBARB NONE DETECTED 03/05/2014 1324    Alcohol Level     Component Value Date/Time   [facility] <5 03/05/2014 2315   Lab Results  Component Value Date   PHENYTOIN < 2.5 02/24/2013   LEVETIRACETA 35.1 03/06/2018   Lab Results  Component Value Date   PHENYTOIN < 2.5 02/24/2013    Imaging and Diagnostic studies  Results for orders placed during the hospital encounter of 12/27/19  CT ANGIO NECK W OR WO CONTRAST 1. Negative CTA for large vessel occlusion. 2. 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 3. Diffuse tortuosity of the major arterial vasculature of the head and neck, suggesting  chronic underlying hypertension. 4. 3 mm bilateral cavernous ICA aneurysms, with additional 3 mm basilar tip aneurysm. 5. Small layering bilateral pleural effusions. 6. Emphysema (ICD10-J43.9).  CTH without contrast: CTH was negative for a large hypodensity concerning for a large territory infarct or hyperdensity concerning for an ICH    Impression   [name] is a 79 y.o. female p/w found down with full on convulsions with generalized shaking and frothing of the mouth. CT Head negative for a large territory stroke, CTA with no LVO. cEEG with PLEDs+. Her neurologic examination is notable for moving her RUE and RLE less than LUE and LLE.  Recommendations  - continue Keppra 500mg  BID - Continue Vimpat 100mg  BID - cEEG for now. - Room to go up on Keppra and Vimpat in case of a seizure. - MRI Brain without contrast after taken off of cEEG. ______________________________________________________________________   Thank you for the opportunity to take part in the care of this patient. If you have any further questions, please contact the neurology consultation attending.  Signed,  [facility] Pager Number 

## 2019-12-29 ENCOUNTER — Inpatient Hospital Stay (HOSPITAL_COMMUNITY): Payer: Medicare HMO

## 2019-12-29 DIAGNOSIS — R569 Unspecified convulsions: Secondary | ICD-10-CM | POA: Diagnosis not present

## 2019-12-29 DIAGNOSIS — I5042 Chronic combined systolic (congestive) and diastolic (congestive) heart failure: Secondary | ICD-10-CM | POA: Diagnosis not present

## 2019-12-29 LAB — GLUCOSE, CAPILLARY
Glucose-Capillary: 130 mg/dL — ABNORMAL HIGH (ref 70–99)
Glucose-Capillary: 109 mg/dL — ABNORMAL HIGH (ref 70–99)
Glucose-Capillary: 109 mg/dL — ABNORMAL HIGH (ref 70–99)

## 2019-12-29 LAB — MAGNESIUM: Magnesium: 1.9 mg/dL (ref 1.7–2.4)

## 2019-12-29 LAB — CBC
HCT: 33 % — ABNORMAL LOW (ref 36.0–46.0)
Hemoglobin: 10.7 g/dL — ABNORMAL LOW (ref 12.0–15.0)
MCH: 31.8 pg (ref 26.0–34.0)
MCHC: 32.4 g/dL (ref 30.0–36.0)
MCV: 97.9 fL (ref 80.0–100.0)
Platelets: 236 10*3/uL (ref 150–400)
RBC: 3.37 MIL/uL — ABNORMAL LOW (ref 3.87–5.11)
RDW: 17 % — ABNORMAL HIGH (ref 11.5–15.5)
WBC: 8.9 10*3/uL (ref 4.0–10.5)
nRBC: 0 % (ref 0.0–0.2)

## 2019-12-29 LAB — BASIC METABOLIC PANEL
Anion gap: 11 (ref 5–15)
BUN: 24 mg/dL — ABNORMAL HIGH (ref 8–23)
CO2: 19 mmol/L — ABNORMAL LOW (ref 22–32)
Calcium: 9.3 mg/dL (ref 8.9–10.3)
Chloride: 109 mmol/L (ref 98–111)
Creatinine, Ser: 1.45 mg/dL — ABNORMAL HIGH (ref 0.44–1.00)
GFR, Estimated: 37 mL/min — ABNORMAL LOW (ref >60)
Glucose, Bld: 104 mg/dL — ABNORMAL HIGH (ref 70–99)
Potassium: 4 mmol/L (ref 3.5–5.1)
Sodium: 139 mmol/L (ref 135–145)

## 2019-12-29 MED ORDER — LEVETIRACETAM IN NACL 1000 MG/100ML IV SOLN
1000.0000 mg | Freq: Two times a day (BID) | INTRAVENOUS | Status: DC
  Administered 2019-12-29 – 2019-12-30 (×4): 1000 mg via INTRAVENOUS
  Filled 2019-12-29 (×5): qty 100

## 2019-12-29 NOTE — Progress Notes (Signed)
NEUROLOGY CONSULTATION PROGRESS NOTE   Date of service: December 29, 2019 Patient Name: [name] MRN:  [id number] DOB:  April 21, 1940  Brief HPI  [name] is a 79 y.o. female wp/w found down with full on convulsions with generalized shaking and frothing of the mouth. Given Midazolam by EMS and loaded with Keppra. Convulsions stopped but she was noted to be confused and not back to her baseline. STAT cEEG with PLEDS +, now on Keppra and Vimpat. CTH and CTA negative for a large territory stroke, no ICH, no LVO. cEEG with LPD but no seizure for 24 hours. cEEG was discontinued.   Interval Hx   She is more awake today but speech does not make sense. Suspect maybe due to focal seizures in the left hemisphere with a field too small to be picked up on cEEG vs pot ictal phenomena. Will increase her Keppra and get MRI Brain  Vitals   Vitals:   12/29/19 0346 12/29/19 0400 12/29/19 0500 12/29/19 0600  BP: (!) 178/95     Pulse: 89 80 83 66  Resp: 20 14 13 13   Temp: 98.4 F (36.9 C)     TempSrc: Axillary     SpO2: 100% 100% 100% 100%  Weight:      Height:         Body mass index is 15.16 kg/m.  Physical Exam   General: Laying comfortably in bed; in no acute distress. HENT: Normal oropharynx and mucosa. Normal external appearance of ears and nose. Neck: Supple, no pain or tenderness CV: No JVD. No peripheral edema. Pulmonary: Symmetric Chest rise. Normal respiratory effort. Abdomen: Soft to touch, non-tender. Ext: No cyanosis, edema, or deformity Skin: No rash. Normal palpation of skin.  Musculoskeletal: Normal digits and nails by inspection. No clubbing.  Neurologic Examination  Mental status/Cognition: eyes open, looks around maintains eye contact, looks towards voice. Does not follow any commands but noted to be moving all extremities spontaneously and somewhat purposefully. Speech/language: non sensical speech. Fluent. Essentially word salad. Cranial  nerves:   CN II Pupils equal and reactive to light   CN III,IV,VI Looks let and right, no obvious nystagmus or gaze preference.   CN V corneals + bilaterally   CN VII Symmetric grimace.   CN VIII Looks towards voice.   CN IX & X    CN XI    CN XII    Motor:  Muscle bulk: poor, tone normal, tremor none Moves all extremities spotaneously and purposefully. Localizes to pain in all extremities. Reflexes:  Right Left Comments  Pectoralis      Biceps (C5/6) 2 2   Brachioradialis (C5/6) 1 1    Triceps (C6/7) 1 1    Patellar (L3/4) 1 1    Achilles (S1)      Hoffman      Plantar withdraws withdraws   Jaw jerk    Sensation:  Light touch Localizes to pain in all extremities.   Pin prick    Temperature    Vibration   Proprioception    Coordination/Complex Motor:  Unable to assesss but no obvious tremor.  Labs   Basic Metabolic Panel:  Lab Results  Component Value Date   NA 139 12/29/2019   K 4.0 12/29/2019   CO2 19 (L) 12/29/2019   GLUCOSE 104 (H) 12/29/2019   BUN 24 (H) 12/29/2019   CREATININE 1.45 (H) 12/29/2019   CALCIUM 9.3 12/29/2019   GFRNONAA 37 (L) 12/29/2019   GFRAA 38 (L) 07/23/2018  HbA1c:  Lab Results  Component Value Date   HGBA1C 5.4 12/28/2019   LDL:  Lab Results  Component Value Date   LDLCALC 85 12/28/2019   Urine Drug Screen:     Component Value Date/Time   LABOPIA NONE DETECTED 03/05/2014 1324   COCAINSCRNUR NONE DETECTED 03/05/2014 1324   LABBENZ NONE DETECTED 03/05/2014 1324   AMPHETMU NONE DETECTED 03/05/2014 1324   THCU NONE DETECTED 03/05/2014 1324   LABBARB NONE DETECTED 03/05/2014 1324    Alcohol Level     Component Value Date/Time   [facility] <5 03/05/2014 2315   Lab Results  Component Value Date   PHENYTOIN < 2.5 02/24/2013   LEVETIRACETA 35.1 03/06/2018   Lab Results  Component Value Date   PHENYTOIN < 2.5 02/24/2013    Imaging and Diagnostic studies  Results for orders placed during the hospital encounter of  12/27/19  CT ANGIO NECK W OR WO CONTRAST 1. Negative CTA for large vessel occlusion. 2. 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 3. Diffuse tortuosity of the major arterial vasculature of the head and neck, suggesting chronic underlying hypertension. 4. 3 mm bilateral cavernous ICA aneurysms, with additional 3 mm basilar tip aneurysm. 5. Small layering bilateral pleural effusions. 6. Emphysema (ICD10-J43.9).  CTH without contrast: CTH was negative for a large hypodensity concerning for a large territory infarct or hyperdensity concerning for an ICH   Impression   [name] is a 79 y.o. female p/w found down with full on convulsions with generalized shaking and frothing of the mouth. CT Head negative for a large territory stroke, CTA with no LVO. cEEG with PLEDs+ but no seizures. cEEG was discontinued. Her mentation is improved but she does seem to have aphasia out of proportion to encephalopathy. Given negative cEEG, this may be a post ictal phenomena or a result of a deeper seizure focus that was not picked up on cEEG.  Recommendations  - Increased Keppra to 1000mg  BID - Continue Vimpat 200mg  BID - MRI Brain without contrast ordered and is pending. - We will continue to follow ______________________________________________________________________   Thank you for the opportunity to take part in the care of this patient. If you have any further questions, please contact the neurology consultation attending.  Signed,  [facility] Pager Number 

## 2019-12-29 NOTE — Progress Notes (Signed)
LTM EEG discontinued - no skin breakdown at unhook.   

## 2019-12-29 NOTE — Progress Notes (Signed)
Attempted to reach RN 2x since 0800 for MRI. Floor not answering.

## 2019-12-29 NOTE — Progress Notes (Signed)
PROGRESS NOTE    [name]  ZOX:[id number] DOB: 1940/10/31 DOA: 12/27/2019 PCP: [name], MD    Brief Narrative:  [name] is a 79 year old female with past medical history significant for seizure disorder, dementia, CKD stage IIIb, and ICM, chronic combined systolic and diastolic congestive heart failure, history of ischemic CVA 2014, gout, essential hypertension who presented to the ED via EMS following breakthrough seizure.  Patient was given IV benzodiazepines via EMS and loaded on IV Keppra in the ED.  Neurology was consulted.  CT head showed no acute intracranial abnormality and CTA head/neck with no large vessel occlusion.  EEG monitoring initiated.  Hospitalist service consulted for further evaluation and management.   Assessment & Plan:   Principal Problem:   Witnessed seizure-like activity (HCC) Active Problems:   Seizure (HCC)   Hyperkalemia   Chronic combined systolic and diastolic congestive heart failure (HCC)   Chronic kidney disease, stage 3b (HCC)   Alzheimer's dementia without behavioral disturbance (HCC)   Gout   Leukocytosis   Acute metabolic encephalopathy Epilepsy with breakthrough seizure Patient presenting from home after being found down with convulsive/generalized shaking seizure activity.  Was given midazolam by EMS and loaded with IV Keppra on arrival to ED.  Convulsions had subsided but continues with confusion which is far from her normal baseline. CT head with no acute intracranial abnormality but does note moderate/advanced chronic microvascular ischemic disease and remote infarcts.  CT head/neck with no large vessel occlusion. EEG with findings of severe cortical irritability left parieto-occipital region. --Neurology following, appreciate assistance --No further seizure activity noted on EEG, discontinued this morning --Keppra 500 mg BID --Vimpat 100mg  BID --Pending MR brain w/o contrast  --Seizure  precautions  Elevated troponin Patient was noted to have an elevated troponin of 1220.  Cardiology overnight was called, suspect this is from type II demand ischemia secondary to seizure as above.  EKG with normal sinus rhythm, rate 79, QTc 536, no concerning ST elevation/depressions or T wave inversions. --Troponin 1220>926>398 --Continue to monitor on telemetry  Hypoglycemia Glucose 104 this morning, likely secondary to n.p.o. status and lethargy. --D5 NS at 50 mL's per hour --Continue to closely monitor CBGs --Hypoglycemic protocol  Essential hypertension Home medications include amlodipine 10 mg p.o. daily, carvedilol 12.5 mg twice daily, losartan 100mg  p.o. daily --Currently n.p.o. due to mental status --Metoprolol 5 mg IV every 6 hours --Vasotec 1.25 mg IV every 6 hours --Hydralazine IV as needed  Chronic combined systolic and diastolic congestive heart failure, compensated Nonischemic cardiomyopathy Follows with cardiology outpatient, Dr. Eldridge Dace.  TTE 10/16/2019 with LVEF 30-35%, LV moderately decreased function with global hypokinesis, trivial MR, moderate TR, mild AR, normal IVC. TTE 11/27 with LVEF 40-45%, LV demonstrates global hypokinesis, trivial MR, mild AR, LA moderately dilated, IVC normal, no evidence of intra-atrial shunt. --Holding home carvedilol and losartan due to lethargy and unable to tolerate oral intake currently --Continue IV metoprolol and Vasotec as above --Strict I's and O's and daily weights  CKD stage IIIb Baseline creatinine 1.5.  Creatinine on admission 1.68, at baseline.  On sodium bicarbonate outpatient. --Cr 1.68>1.41>1.45 --Continue gentle IV fluid hydration while n.p.o. --Avoid nephrotoxins, renally dose all medications --Strict I's and O's and daily weights  Alzheimer's dementia On donepezil 5 mg p.o. nightly at home. --Holding oral meds secondary to mental status --Delirium precautions --Get up during the day --Encourage a familiar face  to remain present throughout the day --Keep blinds open and lights on during daylight hours --Minimize  the use of opioids/benzodiazepines   DVT prophylaxis: SCDs Code Status: Full code Family Communication: No family present at bedside this morning  Disposition Plan:  Status is: Observation  The patient will require care spanning > 2 midnights and should be moved to inpatient because: Altered mental status, Ongoing diagnostic testing needed not appropriate for outpatient work up, Unsafe d/c plan, IV treatments appropriate due to intensity of illness or inability to take PO and Inpatient level of care appropriate due to severity of illness patient currently unable to tolerate oral intake due to mental status/lethargy stemming from breakthrough seizures from underlying epilepsy.  Continues to require IV epileptics.  Dispo: The patient is from: Home              Anticipated d/c is to: To be determined              Anticipated d/c date is: 2 days              Patient currently is not medically stable to d/c.   Consultants:   Neurology  Procedures:   Continuous EEG  TTE  Antimicrobials:   None   Subjective: Patient seen and examined bedside, resting comfortably, mumbling.  Not following commands appropriately.  No further seizure activity appreciate an EEG, discontinued this morning.  Pending MRI brain.  Continues on IV antiepileptics. Unable to obtain any further ROS/history from patient due to her current and mental status.  No acute concerns per nursing staff.  Objective: Vitals:   12/29/19 0600 12/29/19 0803 12/29/19 0817 12/29/19 1118  BP:  (!) 184/93 (!) 174/86 (!) 151/88  Pulse: 66 75 (!) 106 71  Resp: 13 16 18 14   Temp:  97.9 F (36.6 C)  98.8 F (37.1 C)  TempSrc:  Oral    SpO2: 100% 100% 99% 100%  Weight:      Height:        Intake/Output Summary (Last 24 hours) at 12/29/2019 1143 Last data filed at 12/29/2019 0600 Gross per 24 hour  Intake 1210.1 ml   Output -  Net 1210.1 ml   Filed Weights   12/27/19 2345  Weight: 36.4 kg    Examination:  General exam: Appears calm and comfortable, confused/altered Respiratory system: Clear to auscultation. Respiratory effort normal.  On 2 L nasal cannula Cardiovascular system: S1 & S2 heard, RRR. No JVD, murmurs, rubs, gallops or clicks. No pedal edema. Gastrointestinal system: Abdomen is nondistended, soft and nontender. No organomegaly or masses felt. Normal bowel sounds heard. Central nervous system: Alert.  Moving extremities independently but not to command Extremities: Symmetric 5 x 5 power. Skin: No rashes, lesions or ulcers Psychiatry: Unable to assess appropriately secondary to mental status    Data Reviewed: I have personally reviewed following labs and imaging studies  CBC: Recent Labs  Lab 12/27/19 1439 12/29/19 0218  WBC 10.7* 8.9  NEUTROABS 9.6*  --   HGB 11.6* 10.7*  HCT 37.5 33.0*  MCV 102.7* 97.9  PLT 239 236   Basic Metabolic Panel: Recent Labs  Lab 12/27/19 1439 12/28/19 0313 12/29/19 0218  NA 141 138 139  K 5.2* 4.3 4.0  CL 107 105 109  CO2 20* 19* 19*  GLUCOSE 70 66* 104*  BUN 28* 25* 24*  CREATININE 1.68* 1.41* 1.45*  CALCIUM 10.0 9.4 9.3  MG 2.3  --  1.9   GFR: Estimated Creatinine Clearance: 18.7 mL/min (A) (by C-G formula based on SCr of 1.45 mg/dL (H)). Liver Function Tests: Recent Labs  Lab 12/27/19 1439 12/28/19 0313  AST 23 33  ALT 13 15  ALKPHOS 59 57  BILITOT 0.8 0.8  PROT 8.2* 7.2  ALBUMIN 4.4 3.9   No results for input(s): LIPASE, AMYLASE in the last 168 hours. No results for input(s): AMMONIA in the last 168 hours. Coagulation Profile: Recent Labs  Lab 12/28/19 0313  INR 1.1   Cardiac Enzymes: Recent Labs  Lab 12/27/19 1439  CKTOTAL 140   BNP (last 3 results) No results for input(s): PROBNP in the last 8760 hours. HbA1C: Recent Labs    12/28/19 0313  HGBA1C 5.4   CBG: Recent Labs  Lab 12/28/19 0901  12/28/19 0922 12/28/19 1113 12/28/19 1641 12/29/19 0828  GLUCAP 63* 135* 106* 87 130*   Lipid Profile: Recent Labs    12/28/19 0313  CHOL 189  HDL 87  LDLCALC 85  TRIG 84  CHOLHDL 2.2   Thyroid Function Tests: No results for input(s): TSH, T4TOTAL, FREET4, T3FREE, THYROIDAB in the last 72 hours. Anemia Panel: No results for input(s): VITAMINB12, FOLATE, FERRITIN, TIBC, IRON, RETICCTPCT in the last 72 hours. Sepsis Labs: No results for input(s): PROCALCITON, LATICACIDVEN in the last 168 hours.  Recent Results (from the past 240 hour(s))  Resp Panel by RT-PCR (Flu A&B, Covid) Nasopharyngeal Swab     Status: None   Collection Time: 12/27/19  2:55 PM   Specimen: Nasopharyngeal Swab; Nasopharyngeal(NP) swabs in vial transport medium  Result Value Ref Range Status   SARS Coronavirus 2 by RT PCR NEGATIVE NEGATIVE Final    Comment: (NOTE) SARS-CoV-2 target nucleic acids are NOT DETECTED.  The SARS-CoV-2 RNA is generally detectable in upper respiratory specimens during the acute phase of infection. The lowest concentration of SARS-CoV-2 viral copies this assay can detect is 138 copies/mL. A negative result does not preclude SARS-Cov-2 infection and should not be used as the sole basis for treatment or other patient management decisions. A negative result may occur with  improper specimen collection/handling, submission of specimen other than nasopharyngeal swab, presence of viral mutation(s) within the areas targeted by this assay, and inadequate number of viral copies(<138 copies/mL). A negative result must be combined with clinical observations, patient history, and epidemiological information. The expected result is Negative.  Fact Sheet for Patients:  BloggerCourse.com  Fact Sheet for Healthcare Providers:  SeriousBroker.it  This test is no t yet approved or cleared by the [place] FDA and  has been authorized for  detection and/or diagnosis of SARS-CoV-2 by FDA under an Emergency Use Authorization (EUA). This EUA will remain  in effect (meaning this test can be used) for the duration of the COVID-19 declaration under Section 564(b)(1) of the Act, 21 U.S.C.section 360bbb-3(b)(1), unless the authorization is terminated  or revoked sooner.       Influenza A by PCR NEGATIVE NEGATIVE Final   Influenza B by PCR NEGATIVE NEGATIVE Final    Comment: (NOTE) The Xpert Xpress SARS-CoV-2/FLU/RSV plus assay is intended as an aid in the diagnosis of influenza from Nasopharyngeal swab specimens and should not be used as a sole basis for treatment. Nasal washings and aspirates are unacceptable for Xpert Xpress SARS-CoV-2/FLU/RSV testing.  Fact Sheet for Patients: BloggerCourse.com  Fact Sheet for Healthcare Providers: SeriousBroker.it  This test is not yet approved or cleared by the [place] FDA and has been authorized for detection and/or diagnosis of SARS-CoV-2 by FDA under an Emergency Use Authorization (EUA). This EUA will remain in effect (meaning this test can be used) for the  duration of the COVID-19 declaration under Section 564(b)(1) of the Act, 21 U.S.C. section 360bbb-3(b)(1), unless the authorization is terminated or revoked.  Performed at [facility], [address] [place], [place] [address]          Radiology Studies: CT ANGIO HEAD W OR WO CONTRAST  Result Date: 12/27/2019 CLINICAL DATA:  Initial evaluation for acute stroke. EXAM: CT ANGIOGRAPHY HEAD AND NECK TECHNIQUE: Multidetector CT imaging of the head and neck was performed using the standard protocol during bolus administration of intravenous contrast. Multiplanar CT image reconstructions and MIPs were obtained to evaluate the vascular anatomy. Carotid stenosis measurements (when applicable) are obtained utilizing NASCET criteria, using the distal internal carotid  diameter as the denominator. CONTRAST:  46mL OMNIPAQUE IOHEXOL 350 MG/ML SOLN COMPARISON:  Prior head CT from earlier same day. FINDINGS: CTA NECK FINDINGS Aortic arch: Visualized aortic arch of normal caliber with normal branch pattern. Mild atheromatous change about the aortic arch. No hemodynamically significant stenosis seen about the origin of the great vessels. Right carotid system: Right common and internal carotid arteries are tortuous but widely patent without stenosis, dissection or occlusion. Left carotid system: Left CCA tortuous but widely patent to the bifurcation without stenosis. Atheromatous narrowing at the origin of the left ICA measuring up to 35% by NASCET criteria. Left ICA tortuous but otherwise patent distally to the skull base without stenosis, dissection or occlusion. Vertebral arteries: Both vertebral arteries arise from the subclavian arteries. No proximal subclavian artery stenosis. Right vertebral artery dominant. Vertebral arteries mildly tortuous but patent without stenosis, dissection or occlusion. Skeleton: No acute osseous abnormality. No discrete or worrisome osseous lesions. Other neck: No other acute soft tissue abnormality within the neck. No mass or adenopathy. Upper chest: Small layering bilateral pleural effusions partially visualized. Emphysematous changes noted within the visualized lungs. Review of the MIP images confirms the above findings CTA HEAD FINDINGS Anterior circulation: Petrous segments patent bilaterally. Scattered atheromatous plaque throughout the carotid siphons with no more than mild multifocal stenosis. 3 mm aneurysm extending medially and slightly posteriorly seen arising from the cavernous right ICA (series 7, image 106). Additional subtle 3 mm outpouching extending inferiorly and slightly medially from the cavernous left ICA also suspicious for aneurysm (series 8, image 89). ICA termini well perfused. A1 segments patent bilaterally. Normal anterior  communicating artery complex. Anterior cerebral arteries patent to their distal aspects without stenosis. Atheromatous irregularity within the M1 segments without high-grade stenosis or occlusion. Normal MCA bifurcations. Distal MCA branches well perfused, although demonstrate moderate small vessel atheromatous irregularity. Posterior circulation: Both vertebral arteries patent to the vertebrobasilar junction without stenosis. Both PICA patent. Basilar patent to its distal aspect without stenosis. Superior cerebral arteries patent bilaterally. 3 mm aneurysm seen extending superiorly and slightly posteriorly from the basilar tip (series 7, image 99). Both PCAs primarily supplied via the basilar. Scattered atheromatous irregularity throughout the PCA segments bilaterally without high-grade or flow-limiting stenosis. Both PCAs remain well perfused to their distal aspects. Venous sinuses: Patent. Anatomic variants: None significant. Review of the MIP images confirms the above findings IMPRESSION: 1. Negative CTA for large vessel occlusion. 2. 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 3. Diffuse tortuosity of the major arterial vasculature of the head and neck, suggesting chronic underlying hypertension. 4. 3 mm bilateral cavernous ICA aneurysms, with additional 3 mm basilar tip aneurysm. 5. Small layering bilateral pleural effusions. 6. Emphysema (ICD10-J43.9). Electronically Signed   By: Rise Mu M.D.   On:  12/27/2019 23:11   CT Head Wo Contrast  Result Date: 12/27/2019 CLINICAL DATA:  Delirium. EXAM: CT HEAD WITHOUT CONTRAST TECHNIQUE: Contiguous axial images were obtained from the base of the skull through the vertex without intravenous contrast. COMPARISON:  MRI July 29 21, CT head July 23, 2018. FINDINGS: Brain: No evidence of acute large vascular territory infarction, hemorrhage, hydrocephalus, extra-axial collection or mass lesion/mass effect. Moderate to advanced scattered patchy  hypodensity throughout the white matter, compatible with chronic microvascular ischemic disease. Similar remote lacunar infarcts involving bilateral basal ganglia and right thalamus. Similar high paramedian left frontal cortical infarct. Generalized atrophy. Vascular: Calcific atherosclerosis. Skull: No acute fracture. Sinuses/Orbits: No acute finding. Other: No mastoid effusions. IMPRESSION: 1. No evidence of acute intracranial abnormality. 2. Similar moderate to advanced chronic microvascular ischemic disease and remote infarcts. Electronically Signed   By: Feliberto Harts MD   On: 12/27/2019 15:40   CT ANGIO NECK W OR WO CONTRAST  Result Date: 12/27/2019 CLINICAL DATA:  Initial evaluation for acute stroke. EXAM: CT ANGIOGRAPHY HEAD AND NECK TECHNIQUE: Multidetector CT imaging of the head and neck was performed using the standard protocol during bolus administration of intravenous contrast. Multiplanar CT image reconstructions and MIPs were obtained to evaluate the vascular anatomy. Carotid stenosis measurements (when applicable) are obtained utilizing NASCET criteria, using the distal internal carotid diameter as the denominator. CONTRAST:  75mL OMNIPAQUE IOHEXOL 350 MG/ML SOLN COMPARISON:  Prior head CT from earlier same day. FINDINGS: CTA NECK FINDINGS Aortic arch: Visualized aortic arch of normal caliber with normal branch pattern. Mild atheromatous change about the aortic arch. No hemodynamically significant stenosis seen about the origin of the great vessels. Right carotid system: Right common and internal carotid arteries are tortuous but widely patent without stenosis, dissection or occlusion. Left carotid system: Left CCA tortuous but widely patent to the bifurcation without stenosis. Atheromatous narrowing at the origin of the left ICA measuring up to 35% by NASCET criteria. Left ICA tortuous but otherwise patent distally to the skull base without stenosis, dissection or occlusion. Vertebral  arteries: Both vertebral arteries arise from the subclavian arteries. No proximal subclavian artery stenosis. Right vertebral artery dominant. Vertebral arteries mildly tortuous but patent without stenosis, dissection or occlusion. Skeleton: No acute osseous abnormality. No discrete or worrisome osseous lesions. Other neck: No other acute soft tissue abnormality within the neck. No mass or adenopathy. Upper chest: Small layering bilateral pleural effusions partially visualized. Emphysematous changes noted within the visualized lungs. Review of the MIP images confirms the above findings CTA HEAD FINDINGS Anterior circulation: Petrous segments patent bilaterally. Scattered atheromatous plaque throughout the carotid siphons with no more than mild multifocal stenosis. 3 mm aneurysm extending medially and slightly posteriorly seen arising from the cavernous right ICA (series 7, image 106). Additional subtle 3 mm outpouching extending inferiorly and slightly medially from the cavernous left ICA also suspicious for aneurysm (series 8, image 89). ICA termini well perfused. A1 segments patent bilaterally. Normal anterior communicating artery complex. Anterior cerebral arteries patent to their distal aspects without stenosis. Atheromatous irregularity within the M1 segments without high-grade stenosis or occlusion. Normal MCA bifurcations. Distal MCA branches well perfused, although demonstrate moderate small vessel atheromatous irregularity. Posterior circulation: Both vertebral arteries patent to the vertebrobasilar junction without stenosis. Both PICA patent. Basilar patent to its distal aspect without stenosis. Superior cerebral arteries patent bilaterally. 3 mm aneurysm seen extending superiorly and slightly posteriorly from the basilar tip (series 7, image 99). Both PCAs primarily supplied via the basilar. Scattered  atheromatous irregularity throughout the PCA segments bilaterally without high-grade or flow-limiting  stenosis. Both PCAs remain well perfused to their distal aspects. Venous sinuses: Patent. Anatomic variants: None significant. Review of the MIP images confirms the above findings IMPRESSION: 1. Negative CTA for large vessel occlusion. 2. 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 3. Diffuse tortuosity of the major arterial vasculature of the head and neck, suggesting chronic underlying hypertension. 4. 3 mm bilateral cavernous ICA aneurysms, with additional 3 mm basilar tip aneurysm. 5. Small layering bilateral pleural effusions. 6. Emphysema (ICD10-J43.9). Electronically Signed   By: Rise Mu M.D.   On: 12/27/2019 23:11   DG Chest Port 1 View  Result Date: 12/27/2019 CLINICAL DATA:  Unresponsive EXAM: PORTABLE CHEST 1 VIEW COMPARISON:  11/17/2017, 05/19/2015 FINDINGS: No focal opacity or pleural effusion. Mild cardiomegaly. Enlarged mediastinal silhouette, likely augmented by patient rotation. IMPRESSION: Mild cardiomegaly. Enlarged mediastinal silhouette, likely augmented by patient rotation. Electronically Signed   By: Jasmine Pang M.D.   On: 12/27/2019 15:10   EEG adult  Result Date: 12/28/2019 Rejeana Brock, MD     12/28/2019  2:34 AM Patient Name: Mayvis Agudelo MRN: [id number] EEG Attending: Ritta Slot Referring Physician/Provider: Ritta Slot Date: 12/28/2019 Duration: 22 minutes Patient history: 79 yo F with persistent deficits after status epilepticus Level of alertness: Awake, Asleep AEDs during EEG study: Keppra, Vimpat Technical aspects: This EEG study was done with scalp electrodes positioned according to the 10-20 International system of electrode placement. Electrical activity was acquired at a sampling rate of 500Hz  and reviewed with a high frequency filter of 70Hz  and a low frequency filter of 1Hz . EEG data were recorded continuously and digitally stored. BACKGROUND ACTIVITY: Posterior dominant rhythm: There is a poorly  sustained posterior dominant rhythm of 10 Hz seen at times. Slowing: Generalized irregular delta and theta range activity are present throughout the study. There is also focal irregular left posterior quadrant slow activity. EPILEPTIFORM ACTIVITY: Interictal epileptiform activity: Lateralized periodic discharges(LPDs) with spike or polyspike morphology, sometimes with accompanying slow wave are seen at P7 > P3,O1 with a frequency of 1 Hz. Ictal Activity: None OTHER EVENTS: None ACTIVATION PROCEDURES: Hyperventilation and photic stimulation were not performed. IMPRESSION: This study recorded a pattern consistent with severe cortical irritability in the left parieto-occipital region. There was no definite seizure recorded, though the pattern does lie on the ictal-interictal continuum. , MD [facility] [phone] If 7pm- 7am, please page neurology on call as listed in AMION.   Overnight EEG with video  Result Date: 12/28/2019 Ritta Slot, MD     12/29/2019 10:26 AM Patient Name: Hidaya Daniel MRN: Charlsie Quest Epilepsy Attending: 12/31/2019 Referring Physician/Provider: Dr Birdie Riddle Duration: 12/28/2019 0140 to 12/29/2019 0140 Patient history:  79 yo F with persistent deficits after status epilepticus. EEG to evaluate for seizure Level of alertness: Awake, asleep AEDs during EEG study: LEV, LM Technical aspects: This EEG study was done with scalp electrodes positioned according to the 10-20 International system of electrode placement. Electrical activity was acquired at a sampling rate of 500Hz  and reviewed with a high frequency filter of 70Hz  and a low frequency filter of 1Hz . EEG data were recorded continuously and digitally stored. Description: The posterior dominant rhythm consists of 10 Hz activity of moderate voltage (25-35 uV) seen predominantly in posterior head regions, symmetric and reactive to eye opening and eye closing.  Sleep was  characterized by vertex waves, sleep spindles (12 to 14 Hz),  maximal frontocentral region.  EEG showed continuous generalized and maximal left posterior quadrant 3 to 6 Hz theta-delta slowing. Lateralized periodic discharges with spike or polyspike morphology were seen in left posterior quadrant, maximal P7 > P3,O1, at 0.25-1hz .  Hyperventilation and photic stimulation were not performed.   ABNORMALITY -Continuous slow, generalized and maximal left posterior quadrant - Lateralized epileptiform discharges, left posterior quadrant IMPRESSION: This study showed evidence of epileptogenicity as well as cortical dysfunction arising from left posterior quadrant likely due to underlying structural abnormality. The LPDs area seen at 0.25-1hz  which is on the ictal-interictal continuum with higher potential for seizures.  Additionally there is mild to moderate diffuse encephalopathy, non specific etiology. No seizures were seen throughout the recording. Charlsie Quest   ECHOCARDIOGRAM COMPLETE BUBBLE STUDY  Result Date: 12/28/2019    ECHOCARDIOGRAM REPORT   Patient Name:   DANALY BARI Date of Exam: 12/28/2019 Medical Rec #:  [id number]              Height:       61.0 in Accession #:    [id number]             Weight:       80.2 lb Date of Birth:  1940/06/14              BSA:          1.281 m Patient Age:    79 years               BP:           175/95 mmHg Patient Gender: F                      HR:           73 bpm. Exam Location:  Inpatient Procedure: 2D Echo, Cardiac Doppler, Color Doppler and Saline Contrast Bubble            Study Indications:    Stroke 434.91/ I63.9  History:        Patient has prior history of Echocardiogram examinations, most                 recent 10/16/2019. Seizure.  Sonographer:    Roosvelt Maser RDCS Referring Phys: [id number] Deno Lunger [facility] IMPRESSIONS  1. Left ventricular ejection fraction, by estimation, is 40 to 45%. The left ventricle has mildly decreased function. The left  ventricle demonstrates global hypokinesis. Left ventricular diastolic parameters were normal.  2. Right ventricular systolic function is normal. The right ventricular size is normal. There is mildly elevated pulmonary artery systolic pressure.  3. Left atrial size was moderately dilated.  4. The mitral valve is abnormal. Trivial mitral valve regurgitation.  5. The aortic valve is tricuspid. Aortic valve regurgitation is mild. Mild aortic valve sclerosis is present, with no evidence of aortic valve stenosis.  6. The inferior vena cava is normal in size with greater than 50% respiratory variability, suggesting right atrial pressure of 3 mmHg.  7. Agitated saline contrast bubble study was negative, with no evidence of any interatrial shunt. Comparison(s): 10/16/19: LVEF 30-35%, global HK. FINDINGS  Left Ventricle: Left ventricular ejection fraction, by estimation, is 40 to 45%. The left ventricle has mildly decreased function. The left ventricle demonstrates global hypokinesis. The left ventricular internal cavity size was normal in size. There is  no left ventricular hypertrophy. Left ventricular diastolic function could not be evaluated due to indeterminate diastolic function. Left ventricular diastolic parameters were  normal. Right Ventricle: The right ventricular size is normal. No increase in right ventricular wall thickness. Right ventricular systolic function is normal. There is mildly elevated pulmonary artery systolic pressure. The tricuspid regurgitant velocity is 3.01  m/s, and with an assumed right atrial pressure of 3 mmHg, the estimated right ventricular systolic pressure is 39.2 mmHg. Left Atrium: Left atrial size was moderately dilated. Right Atrium: Right atrial size was normal in size. Pericardium: There is no evidence of pericardial effusion. Mitral Valve: The mitral valve is abnormal. There is mild thickening of the mitral valve leaflet(s). Trivial mitral valve regurgitation. Tricuspid Valve: The  tricuspid valve is grossly normal. Tricuspid valve regurgitation is mild. Aortic Valve: The aortic valve is tricuspid. Aortic valve regurgitation is mild. Mild aortic valve sclerosis is present, with no evidence of aortic valve stenosis. Pulmonic Valve: The pulmonic valve was grossly normal. Pulmonic valve regurgitation is trivial. Aorta: The aortic root and ascending aorta are structurally normal, with no evidence of dilitation. Venous: The inferior vena cava is normal in size with greater than 50% respiratory variability, suggesting right atrial pressure of 3 mmHg. IAS/Shunts: No atrial level shunt detected by color flow Doppler. Agitated saline contrast was given intravenously to evaluate for intracardiac shunting. Agitated saline contrast bubble study was negative, with no evidence of any interatrial shunt.  LEFT VENTRICLE PLAX 2D LVIDd:         3.30 cm     Diastology LVIDs:         2.40 cm     LV e' lateral: 4.13 cm/s LV PW:         0.80 cm LV IVS:        0.90 cm LVOT diam:     1.90 cm LV SV:         37 LV SV Index:   29 LVOT Area:     2.84 cm  LV Volumes (MOD) LV vol d, MOD A2C: 52.1 ml LV vol d, MOD A4C: 53.3 ml LV vol s, MOD A2C: 29.9 ml LV vol s, MOD A4C: 31.7 ml LV SV MOD A2C:     22.2 ml LV SV MOD A4C:     53.3 ml LV SV MOD BP:      23.1 ml RIGHT VENTRICLE RV Basal diam:  3.30 cm RV S prime:     10.40 cm/s TAPSE (M-mode): 1.4 cm LEFT ATRIUM             Index       RIGHT ATRIUM           Index LA diam:        2.80 cm 2.19 cm/m  RA Area:     15.00 cm LA Vol (A2C):   64.7 ml 50.50 ml/m RA Volume:   40.30 ml  31.45 ml/m LA Vol (A4C):   44.1 ml 34.42 ml/m LA Biplane Vol: 58.4 ml 45.58 ml/m  AORTIC VALVE LVOT Vmax:   65.30 cm/s LVOT Vmean:  41.700 cm/s LVOT VTI:    0.132 m  AORTA Ao Root diam: 3.50 cm Ao Asc diam:  3.20 cm TRICUSPID VALVE TR Peak grad:   36.2 mmHg TR Vmax:        301.00 cm/s  SHUNTS Systemic VTI:  0.13 m Systemic Diam: 1.90 cm Zoila Shutter MD Electronically signed by Zoila Shutter MD  Signature Date/Time: 12/28/2019/12:27:39 PM    Final         Scheduled Meds: . aspirin  300 mg Rectal Daily   Or  .  aspirin  325 mg Oral Daily  . enalaprilat  1.25 mg Intravenous Q6H  . metoprolol tartrate  5 mg Intravenous Q6H   Continuous Infusions: . dextrose 5 % and 0.9% NaCl 50 mL/hr at 12/29/19 0622  . lacosamide (VIMPAT) IV 200 mg (12/29/19 1117)  . levETIRAcetam 1,000 mg (12/29/19 1033)     LOS: 1 day    Time spent: 39 minutes spent on chart review, discussion with nursing staff, consultants, updating family and interview/physical exam; more than 50% of that time was spent in counseling and/or coordination of care.    Alvira Philips [place], DO [facility] Available via Epic secure chat 7am-7pm After these hours, please refer to coverage provider listed on [email or web address] 12/29/2019, 11:43 AM

## 2019-12-29 NOTE — Procedures (Signed)
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 EEG to evaluate for seizure  Level of alertness: Awake, asleep  AEDs during EEG study: LEV, LM  Technical aspects: This EEG study was done with scalp electrodes positioned according to the 10-20 International system of electrode placement. Electrical activity was acquired at a sampling rate of 500Hz  and reviewed with a high frequency filter of 70Hz  and a low frequency filter of 1Hz . EEG data were recorded continuously and digitally stored.   Description: The posterior dominant rhythm consists of 10 Hz activity of moderate voltage (25-35 uV) seen predominantly in posterior head regions, symmetric and reactive to eye opening and eye closing.  Sleep was characterized by vertex waves, sleep spindles (12 to 14 Hz), maximal frontocentral region.  EEG showed continuous generalized and maximal left posterior quadrant 3 to 6 Hz theta-delta slowing. Lateralized periodic discharges with spike or polyspike morphology were seen in left posterior quadrant, maximal P7 > P3,O1, at 0.25-1hz .  Hyperventilation and photic stimulation were not performed.     Event button was pressed on 12/29/2019 at 0813 during which patient was noted to have non rhythmic left arm movements. Concomitant eeg before, during and after the event didn't show any eeg change to suggest seizure.  ABNORMALITY -Continuous slow, generalized and maximal left posterior quadrant - Lateralized epileptiform discharges, left posterior quadrant  IMPRESSION: This study showed evidence of epileptogenicity as well as cortical dysfunction arising from left posterior quadrant likely due to underlying structural abnormality. The LPDs area seen at 0.25-1hz  which is on the  ictal-interictal continuum with higher potential for seizures.  Additionally there is mild to moderate diffuse encephalopathy, non specific etiology. No seizures were seen throughout the recording.  Event button was pressed on 12/29/2019 at 0813 during which patient was noted to have non rhythmic left arm movements without concomitant eeg change and was likely not epileptic.  [name] 

## 2019-12-29 NOTE — Progress Notes (Signed)
Attempted to reach RN again to receive report on patient status for MRI - on hold for 5+ minutes. Will reattempt later on.

## 2019-12-29 NOTE — Evaluation (Signed)
Physical Therapy Evaluation Patient Details Name: [name] MRN: [id number] DOB: 1940-08-03 Today's Date: 12/29/2019   History of Present Illness  79 year old female with past medical history of systolic and diastolic congestive heart failure; nonischemic cardiomyopathy, seizure disorder, chronic kidney disease stage IIIb, previous stroke (2014), gout, hypertension, and Alzheimer's dementia who presents to [facility] emergency department via EMS after being found with suspected seizure activity by her significant other.  Clinical Impression  Patient presents with generalized weakness, lethargy, impaired balance, impaired cognition and impaired mobility s/p above. Per significant other (present in room), pt was Mod I for ADLs and IADLs and lives alone PTA. Today, pt with unintelligible speech; only able to understand "the devil," with pt referring to CJ (inside joke) as this. Pt not following any motor commands today however able to look at me with verbal cues. Requires total A for bed mobility and MOd A for static sitting balance with moments of Min guard but not able to sustain. Would benefit from SNF to maximize independence and mobility prior to return home. Will follow acutely.    Follow Up Recommendations SNF;Supervision for mobility/OOB    Equipment Recommendations  Other (comment) (defer to next venue)    Recommendations for Other Services       Precautions / Restrictions Precautions Precautions: Fall;Other (comment) Precaution Comments: seizure precautions Restrictions Weight Bearing Restrictions: No      Mobility  Bed Mobility Overal bed mobility: Needs Assistance Bed Mobility: Supine to Sit;Sit to Sidelying     Supine to sit: Total assist;HOB elevated   Sit to sidelying: Total assist;HOB elevated General bed mobility comments: No initiation or assist with transfer today despite cues.    Transfers                 General transfer  comment: Deferred due to poor sitting balance/lethargy.  Ambulation/Gait             General Gait Details: Unable  Stairs            Wheelchair Mobility    Modified Rankin (Stroke Patients Only)       Balance Overall balance assessment: Needs assistance Sitting-balance support: Feet supported;No upper extremity supported Sitting balance-Leahy Scale: Poor Sitting balance - Comments: requires mostly Mod-Max A for sitting balance, able to hold self up for a few seconds at a time but not sustain. Postural control: Posterior lean                                   Pertinent Vitals/Pain Pain Assessment: Faces Faces Pain Scale: No hurt    Home Living Family/patient expects to be discharged to:: Private residence Living Arrangements: Alone Available Help at Discharge: Friend(s);Family;Available PRN/intermittently Type of Home: House Home Access: Stairs to enter Entrance Stairs-Rails: None Entrance Stairs-Number of Steps: 3 Home Layout: One level Home Equipment: Shower seat;Cane - single point;Bedside commode      Prior Function Level of Independence: Needs assistance   Gait / Transfers Assistance Needed: Independent with ambulation. Significant other helps with driving.  ADL's / Homemaking Assistance Needed: Does own ADLs/IADLs. Manages own medications.  Comments: Significant other reports pt cooked Thanksgiving dinner for the whole community. Concerned about her ability to manage her medications     Hand Dominance   Dominant Hand: Left    Extremity/Trunk Assessment   Upper Extremity Assessment Upper Extremity Assessment: Defer to OT evaluation;Generalized weakness (Some tremoring noted in  LUE)    Lower Extremity Assessment Lower Extremity Assessment: Generalized weakness    Cervical / Trunk Assessment Cervical / Trunk Assessment: Kyphotic  Communication   Communication: Expressive difficulties (unintelligible speech)  Cognition  Arousal/Alertness: Lethargic Behavior During Therapy: Flat affect Overall Cognitive Status: Difficult to assess Area of Impairment: Attention                   Current Attention Level: Focused           General Comments: Pt with unintelligible speech; only thing that was understood was "the devil" referring to her sig otherin the room (inside joke per sig other) and she smiled. Not following any motor commands today. Able to follow you with her gaze with her name. Hx of dementia.      General Comments General comments (skin integrity, edema, etc.): Significant other, CJ, present and provided information regarding PLOF/history.    Exercises     Assessment/Plan    PT Assessment Patient needs continued PT services  PT Problem List Decreased strength;Decreased mobility;Decreased safety awareness;Decreased activity tolerance;Decreased cognition;Cardiopulmonary status limiting activity;Decreased balance       PT Treatment Interventions Therapeutic activities;DME instruction;Cognitive remediation;Gait training;Therapeutic exercise;Patient/family education;Functional mobility training;Balance training;Wheelchair mobility training;Neuromuscular re-education    PT Goals (Current goals can be found in the Care Plan section)  Acute Rehab PT Goals Patient Stated Goal: per her significant other, to get stronger PT Goal Formulation: Patient unable to participate in goal setting Time For Goal Achievement: 01/12/20 Potential to Achieve Goals: Fair    Frequency Min 3X/week   Barriers to discharge Decreased caregiver support lives alone    Co-evaluation               AM-PAC PT "6 Clicks" Mobility  Outcome Measure Help needed turning from your back to your side while in a flat bed without using bedrails?: Total Help needed moving from lying on your back to sitting on the side of a flat bed without using bedrails?: Total Help needed moving to and from a bed to a chair  (including a wheelchair)?: Total Help needed standing up from a chair using your arms (e.g., wheelchair or bedside chair)?: Total Help needed to walk in hospital room?: Total Help needed climbing 3-5 steps with a railing? : Total 6 Click Score: 6    End of Session Equipment Utilized During Treatment: Oxygen Activity Tolerance: Patient limited by lethargy;Patient limited by fatigue Patient left: in bed;with call bell/phone within reach;with bed alarm set;with SCD's reapplied;with family/visitor present Nurse Communication: Mobility status PT Visit Diagnosis: Muscle weakness (generalized) (M62.81)    Time: 3149-7026 PT Time Calculation (min) (ACUTE ONLY): 21 min   Charges:   PT Evaluation $PT Eval Moderate Complexity: 1 Mod          Vale Haven, PT, DPT Acute Rehabilitation Services Pager [phone] Office [phone]      Blake Divine A Mariusz Jubb 12/29/2019, 11:14 AM

## 2019-12-30 DIAGNOSIS — I5042 Chronic combined systolic (congestive) and diastolic (congestive) heart failure: Secondary | ICD-10-CM | POA: Diagnosis not present

## 2019-12-30 DIAGNOSIS — R569 Unspecified convulsions: Secondary | ICD-10-CM | POA: Diagnosis not present

## 2019-12-30 LAB — BASIC METABOLIC PANEL
Anion gap: 12 (ref 5–15)
BUN: 18 mg/dL (ref 8–23)
CO2: 19 mmol/L — ABNORMAL LOW (ref 22–32)
Calcium: 9.2 mg/dL (ref 8.9–10.3)
Chloride: 110 mmol/L (ref 98–111)
Creatinine, Ser: 1.42 mg/dL — ABNORMAL HIGH (ref 0.44–1.00)
GFR, Estimated: 38 mL/min — ABNORMAL LOW (ref >60)
Glucose, Bld: 111 mg/dL — ABNORMAL HIGH (ref 70–99)
Potassium: 3.7 mmol/L (ref 3.5–5.1)
Sodium: 141 mmol/L (ref 135–145)

## 2019-12-30 LAB — GLUCOSE, CAPILLARY: Glucose-Capillary: 105 mg/dL — ABNORMAL HIGH (ref 70–99)

## 2019-12-30 LAB — MAGNESIUM: Magnesium: 1.9 mg/dL (ref 1.7–2.4)

## 2019-12-30 MED ORDER — AMLODIPINE BESYLATE 10 MG PO TABS
10.0000 mg | ORAL_TABLET | Freq: Every day | ORAL | Status: DC
  Administered 2019-12-30 – 2020-01-02 (×4): 10 mg via ORAL
  Filled 2019-12-30 (×4): qty 1

## 2019-12-30 MED ORDER — CARVEDILOL 12.5 MG PO TABS
12.5000 mg | ORAL_TABLET | Freq: Two times a day (BID) | ORAL | Status: DC
  Administered 2019-12-30 – 2020-01-02 (×6): 12.5 mg via ORAL
  Filled 2019-12-30 (×7): qty 1

## 2019-12-30 MED ORDER — LORAZEPAM 2 MG/ML IJ SOLN: 2.0000 mg | INTRAMUSCULAR | Status: DC | PRN

## 2019-12-30 NOTE — Evaluation (Signed)
Clinical/Bedside Swallow Evaluation Patient Details  Name: [name] MRN: [id number] Date of Birth: 03-Jul-1940  Today's Date: 12/30/2019 Time: SLP Start Time (ACUTE ONLY): 1110 SLP Stop Time (ACUTE ONLY): 1117 SLP Time Calculation (min) (ACUTE ONLY): 7 min  Past Medical History:  Past Medical History:  Diagnosis Date  . Abdominal pain 02/24/2013  . Acute combined systolic and diastolic congestive heart failure (HCC) 02/24/2013  . Acute on chronic renal insufficiency 05/15/2015  . Acute renal failure (HCC) 12/28/2012  . Acute renal failure superimposed on stage 3 chronic kidney disease (HCC)   . Anemia 05/20/2015  . 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 Basal ganglia infarction (HCC) 01/08/2013  . CAP (community acquired pneumonia)   . Chest pain 05/14/2015  . Chronic combined systolic and diastolic CHF (congestive heart failure) (HCC) 03/06/2014  . Congestive heart failure (CHF) (HCC)   . Constipation 02/24/2013  . Cough   . Diastolic dysfunction-grade 3 05/20/2015  . Dyspnea 02/24/2013  . Encephalopathy 12/28/2012  . Epilepsy (HCC)   . Generalized seizure (HCC) 12/28/2012  . H/O: CVA (cerebrovascular accident) 01/04/2013  . Hyperammonemia (HCC) 05/17/2015  . Hyperglycemia 02/24/2013  . Hyperkalemia 02/11/2013  . Hypertension   . 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 Hyponatremia 02/11/2013  . Influenza A 02/11/2013  . Liver enzyme elevation 02/11/2013  . Metabolic acidosis 12/28/2012  . Metabolic encephalopathy 05/16/2015  . NICM (nonischemic cardiomyopathy) -EF 25% 05/20/2015  . Pleuritic chest pain 02/24/2013  . PNA (pneumonia) 05/20/2015  . Pneumobilia 02/12/2013  . Pneumonia   . Seizure (HCC) 12/28/2012  . Seizures (HCC)   . Smoker   . Stroke (HCC)   . Troponin level elevated 05/14/2015  . Volume overload 02/24/2013  . Weight gain 02/24/2013   Past Surgical History:  Past Surgical History:  Procedure Laterality Date  . NO PAST  SURGERIES     HPI:  [name] is a 79 year old female with past medical history significant for seizure disorder, dementia, CKD stage IIIb, and ICM, chronic combined systolic and diastolic congestive heart failure, history of ischemic CVA 2014, gout, essential hypertension who presented to the ED via EMS following breakthrough seizure. MRI11/28 with no acute findings; CXR 11/26 clear; EEG 11/28: "evidence of epileptogenicity as well as cortical dysfunction arising from left posterior quadrant likely due to underlying structural abnormality"   Assessment / Plan / Recommendation Clinical Impression  Pt presents with functional swallowing as assessed clinically. Pt has upper denture plate, but this is not present in facility at this time.  Pt was unable to bite into cracker, but when a piece of cracker was placed in pt's mouth, she exhibited good oral clearance of solids.  There were no clinical s/s of aspiration with any consistencies trialed.    Recommend mechanical soft diet with thin liquids.  Pt will likely be able to advance diet texture when dentures are present.  Son will look for dentures.  SLP Visit Diagnosis: Dysphagia, unspecified (R13.10)    Aspiration Risk  Mild aspiration risk    Diet Recommendation Dysphagia 3 (Mech soft);Thin liquid   Liquid Administration via: Cup;Straw Medication Administration: Whole meds with liquid Supervision: Staff to assist with self feeding Compensations: Slow rate;Small sips/bites Postural Changes: Seated upright at 90 degrees    Other  Recommendations Oral Care Recommendations: Oral care BID   Follow up Recommendations  (ST at next level of care)      Frequency and Duration min 2x/week  2 weeks  Prognosis Prognosis for Safe Diet Advancement: Good      Swallow Study   General Date of Onset: 12/29/19 HPI: [name] is a 79 year old female with past medical history significant for seizure disorder, dementia,  CKD stage IIIb, and ICM, chronic combined systolic and diastolic congestive heart failure, history of ischemic CVA 2014, gout, essential hypertension who presented to the ED via EMS following breakthrough seizure. MRI11/28 with no acute findings; CXR 11/26 clear; EEG 11/28: "evidence of epileptogenicity as well as cortical dysfunction arising from left posterior quadrant likely due to underlying structural abnormality" Type of Study: Bedside Swallow Evaluation Previous Swallow Assessment: none Diet Prior to this Study: NPO Temperature Spikes Noted: No Respiratory Status: Room air History of Recent Intubation: No Behavior/Cognition: Alert;Cooperative;Pleasant mood;Requires cueing Oral Cavity Assessment: Within Functional Limits Oral Care Completed by SLP: No Oral Cavity - Dentition: Dentures, not available;Dentures, bottom Self-Feeding Abilities: Needs assist Patient Positioning: Upright in bed Baseline Vocal Quality: Normal Volitional Cough: Strong Volitional Swallow: Unable to elicit    Oral/Motor/Sensory Function Overall Oral Motor/Sensory Function: Within functional limits Facial ROM: Within Functional Limits Facial Symmetry: Within Functional Limits Lingual ROM: Within Functional Limits Lingual Symmetry: Within Functional Limits Lingual Strength:  (unable to test) Velum: Within Functional Limits Mandible: Within Functional Limits   Ice Chips Ice chips: Not tested   Thin Liquid Thin Liquid: Within functional limits Presentation: Cup;Straw    Nectar Thick Nectar Thick Liquid: Not tested   Honey Thick Honey Thick Liquid: Not tested   Puree Puree: Within functional limits Presentation: Spoon   Solid     Solid: Impaired Oral Phase Impairments: Impaired mastication      Kerrie Pleasure , MA, CCC-SLP Acute Rehabilitation Services Office: [phone]; Pager (11/29): [phone] 12/30/2019,12:04 PM

## 2019-12-30 NOTE — Progress Notes (Signed)
Occupational Therapy Evaluation Patient Details Name: [name] MRN: [id number] DOB: 02-02-1940 Today's Date: 12/30/2019    History of Present Illness 79 year old female with past medical history of systolic and diastolic congestive heart failure; nonischemic cardiomyopathy, seizure disorder, chronic kidney disease stage IIIb, previous stroke (2014), gout, hypertension, and Alzheimer's dementia who presents to [facility] emergency department via EMS after being found with suspected seizure activity by her significant other. MRI  - no acute intracranial abnormality; severe chronic small vessel ischemic disease with multiple chronic infarcts and microhemorrhages.    Clinical Impression   PTA, pt lived alone and was independent with ADL and mobility. Pt did her own cooking/cleaning and had cooked Thanksgiving dinner for people in her neighborhood prior to this hospitalization. Pt with apparent speech and cognitive deficits. R side appears weaker than L and pt demonstrates difficulty with motor planning. Pt will benefit post acute rehab at SNF to maximize functional level of independence. Will follow acutely.     Follow Up Recommendations  SNF;Supervision/Assistance - 24 hour    Equipment Recommendations  3 in 1 bedside commode    Recommendations for Other Services       Precautions / Restrictions Precautions Precautions: Fall;Other (comment) (at [facility] for skin breakdown)      Mobility Bed Mobility Overal bed mobility: Needs Assistance       Supine to sit: Max assist   Sit to sidelying: Max assist;HOB elevated      Transfers Overall transfer level: Needs assistance   Transfers: Sit to/from Stand Sit to Stand: Mod assist              Balance Overall balance assessment: Needs assistance   Sitting balance-Leahy Scale: Fair       Standing balance-Leahy Scale: Poor                             ADL either performed or assessed with  clinical judgement   ADL Overall ADL's : Needs assistance/impaired Eating/Feeding: NPO   Grooming: Maximal assistance;Wash/dry face;Wash/dry hands Grooming Details (indicate cue type and reason): hand over hand to initiate washing face; attempting to "comb" therapist's pocket Upper Body Bathing: Maximal assistance;Sitting   Lower Body Bathing: Total assistance   Upper Body Dressing : Maximal assistance;Sitting   Lower Body Dressing: Total assistance;Sit to/from Market researcher Details (indicate cue type and reason): able to stand with mod A however unable to pivot     Tub/ Shower Transfer: Total assistance   Functional mobility during ADLs: Moderate assistance General ADL Comments: sit - stand only; unableto side step at this point     Vision Baseline Vision/History: Wears glasses Wears Glasses: Reading only Additional Comments: will further assess     Perception Perception Comments: will assess   Praxis Praxis Praxis tested?: Deficits Deficits: Perseveration;Limb apraxia (will continue to assess)    Pertinent Vitals/Pain Pain Assessment: Faces Faces Pain Scale: Hurts a little bit Pain Location: R knee Pain Descriptors / Indicators: Grimacing;Guarding Pain Intervention(s): Limited activity within patient's tolerance;Repositioned     Hand Dominance Left   Extremity/Trunk Assessment Upper Extremity Assessment Upper Extremity Assessment: RUE deficits/detail;LUE deficits/detail RUE Deficits / Details: RUE appears weaker than L; apparent motor planning deficits; not attempting to use functionally, keeping hnad on chest; poor in-hand manipulation skills and weak grasp overall RUE Coordination: decreased fine motor;decreased gross motor LUE Deficits / Details: genralized weakness; able to hold washcloth; comb LUE Coordination:  decreased fine motor   Lower Extremity Assessment Lower Extremity Assessment: Defer to PT evaluation   Cervical / Trunk  Assessment Cervical / Trunk Assessment: Kyphotic   Communication Communication Communication: Expressive difficulties   Cognition Arousal/Alertness: Awake/alert Behavior During Therapy: Flat affect Overall Cognitive Status: Impaired/Different from baseline Area of Impairment: Orientation;Attention;Memory;Following commands;Safety/judgement;Awareness;Problem solving                 Orientation Level:  (difficult to assess) Current Attention Level: Focused   Following Commands: Follows one step commands inconsistently;Follows one step commands with increased time Safety/Judgement: Decreased awareness of safety;Decreased awareness of deficits Awareness: Intellectual Problem Solving: Slow processing;Decreased initiation;Difficulty sequencing;Requires verbal cues;Requires tactile cues General Comments: More verbal than PT session however apparent language deficits; perseverating   General Comments  spoke with son over the phone who states his mom lived alone adn was independent    Exercises     Shoulder Instructions      Home Living Family/patient expects to be discharged to:: Private residence Living Arrangements: Alone Available Help at Discharge: Family;Friend(s);Available 24 hours/day Type of Home: House Home Access: Stairs to enter [employer] of Steps: 3 Entrance Stairs-Rails: None Home Layout: One level     Bathroom Shower/Tub: Chief Strategy Officer: Standard     Home Equipment: Shower seat;Cane - single point;Bedside commode;Walker - 2 wheels          Prior Functioning/Environment Level of Independence: Needs assistance  Gait / Transfers Assistance Needed: Independent with ambulation. ADL's / Homemaking Assistance Needed: Does own ADLs/IADLs. Manages own medications. Communication / Swallowing Assistance Needed: no difficulties Comments: Significant other reports pt cooked Thanksgiving dinner for the whole community. Concerned  about her ability to manage her medications; significant other drives        OT Problem List: Decreased strength;Decreased range of motion;Decreased activity tolerance;Impaired balance (sitting and/or standing);Impaired vision/perception;Decreased coordination;Decreased cognition;Decreased safety awareness;Decreased knowledge of use of DME or AE;Decreased knowledge of precautions;Impaired UE functional use;Pain      OT Treatment/Interventions: Self-care/ADL training;Therapeutic exercise;Neuromuscular education;DME and/or AE instruction;Therapeutic activities;Cognitive remediation/compensation;Visual/perceptual remediation/compensation;Patient/family education;Balance training    OT Goals(Current goals can be found in the care plan section) Acute Rehab OT Goals Patient Stated Goal: per son for his mom to get some rehab OT Goal Formulation: With family Time For Goal Achievement: 01/13/20 Potential to Achieve Goals: Good  OT Frequency: Min 2X/week   Barriers to D/C:            Co-evaluation              AM-PAC OT "6 Clicks" Daily Activity     Outcome Measure Help from another person eating meals?: Total Help from another person taking care of personal grooming?: A Lot Help from another person toileting, which includes using toliet, bedpan, or urinal?: Total Help from another person bathing (including washing, rinsing, drying)?: A Lot Help from another person to put on and taking off regular upper body clothing?: A Lot Help from another person to put on and taking off regular lower body clothing?: Total 6 Click Score: 9   End of Session Equipment Utilized During Treatment: Gait belt Nurse Communication: Mobility status  Activity Tolerance: Patient tolerated treatment well Patient left: in bed;with call bell/phone within reach;with bed alarm set;with SCD's reapplied  OT Visit Diagnosis: Unsteadiness on feet (R26.81);Other abnormalities of gait and mobility (R26.89);Muscle  weakness (generalized) (M62.81);Apraxia (R48.2);Other symptoms and signs involving cognitive function;Other symptoms and signs involving the nervous system (R29.898);Cognitive communication deficit (R41.841);Pain Symptoms and  signs involving cognitive functions: Other cerebrovascular disease Pain - Right/Left: Right Pain - part of body: Knee                Time: 0940-1010 OT Time Calculation (min): 30 min Charges:  OT General Charges $OT Visit: 1 Visit OT Evaluation $OT Eval Moderate Complexity: 1 Mod OT Treatments $Self Care/Home Management : 8-22 mins  Luisa Dago, OT/L   Acute OT Clinical Specialist Acute Rehabilitation Services Pager [phone] Office [phone]   [facility] 12/30/2019, 11:21 AM

## 2019-12-30 NOTE — [place] FL2 (Signed)
[place] MEDICAID FL2 LEVEL OF CARE SCREENING TOOL     IDENTIFICATION  Patient Name: [name] Birthdate: Dec 03, 1940 Sex: female Admission Date (Current Location): 12/27/2019  [facility] and [place] Number:  Producer, television/film/video and Address:  [facility], [address] [place], [place] [address]      Provider Number: [id number]  Attending Physician Name and Address:  [place], Eric J, DO  Relative Name and Phone Number:  [name], son, [phone]    Current Level of Care: Hospital Recommended Level of Care: Skilled Nursing Facility Prior Approval Number:    Date Approved/Denied:   PASRR Number: [id number]  Discharge Plan: SNF    Current Diagnoses: Patient Active Problem List   Diagnosis Date Noted  . Witnessed seizure-like activity (HCC) 12/27/2019  . Chronic kidney disease, stage 3b (HCC) 12/27/2019  . Alzheimer's dementia without behavioral disturbance (HCC) 12/27/2019  . Gout 12/27/2019  . Leukocytosis 12/27/2019  . Cough   . Encounter for palliative care   . Goals of care, counseling/discussion   . Pneumonia   . NICM (nonischemic cardiomyopathy) -EF 25% 05/20/2015  . No significant CAD at cath May 2012 05/20/2015  . Anemia 05/20/2015  . Diastolic dysfunction-grade 3 05/20/2015  . PNA (pneumonia) 05/20/2015  . Acute renal failure superimposed on stage 3 chronic kidney disease (HCC)   . CAP (community acquired pneumonia)   . Hyperammonemia (HCC) 05/17/2015  . Metabolic encephalopathy 05/16/2015  . Acute on chronic renal insufficiency 05/15/2015  . Chest pain 05/14/2015  . Troponin level elevated 05/14/2015  . Chronic combined systolic and diastolic congestive heart failure (HCC) 03/06/2014  . Acute combined systolic and diastolic congestive heart failure (HCC) 02/24/2013  . Weight gain 02/24/2013  . Volume overload 02/24/2013  . Hyperglycemia 02/24/2013  . Dyspnea 02/24/2013  . Constipation 02/24/2013  . Abdominal  pain 02/24/2013  . Pleuritic chest pain 02/24/2013  . Epilepsy (HCC)   . Smoker   . 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 Protein-calorie malnutrition, severe- wgt 86 lbs 02/13/2013  . Pneumobilia 02/12/2013  . Influenza A 02/11/2013  . Hyperkalemia 02/11/2013  . Hyponatremia 02/11/2013  . HCAP (healthcare-associated pneumonia) 02/11/2013  . Liver enzyme elevation 02/11/2013  . Basal ganglia infarction (HCC) 01/08/2013  . H/O: CVA (cerebrovascular accident) 01/04/2013  . Generalized seizure (HCC) 12/28/2012  . 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 Seizure (HCC) 12/28/2012  . Metabolic acidosis 12/28/2012  . Encephalopathy 12/28/2012  . Acute renal failure (HCC) 12/28/2012    Orientation RESPIRATION BLADDER Height & Weight      (Disoriented)  Normal Incontinent, External catheter Weight: 80 lb 4 oz (36.4 kg) Height:  5\' 1"  (154.9 cm)  BEHAVIORAL SYMPTOMS/MOOD NEUROLOGICAL BOWEL NUTRITION STATUS      Incontinent Diet (Please see DC Summary)  AMBULATORY STATUS COMMUNICATION OF NEEDS Skin   Extensive Assist Verbally Normal                       Personal Care Assistance Level of Assistance  Bathing, Feeding, Dressing Bathing Assistance: Maximum assistance Feeding assistance: Limited assistance Dressing Assistance: Limited assistance     Functional Limitations Info             SPECIAL CARE FACTORS FREQUENCY  PT (By licensed PT), OT (By licensed OT)     PT Frequency: 5x/week OT Frequency: 5x/week            Contractures Contractures Info: Not present  Additional Factors Info  Code Status, Allergies Code Status Info: Full Allergies Info: NKA           Current Medications (12/30/2019):  This is the current hospital active medication list Current Facility-Administered Medications  Medication Dose Route Frequency Provider Last Rate Last Admin  . acetaminophen (TYLENOL) tablet 650 mg  650 mg Oral Q4H PRN Shalhoub,  Deno Lunger, MD       Or  . acetaminophen (TYLENOL) 160 MG/5ML solution 650 mg  650 mg Per Tube Q4H PRN Shalhoub, Deno Lunger, MD       Or  . acetaminophen (TYLENOL) suppository 650 mg  650 mg Rectal Q4H PRN Shalhoub, Deno Lunger, MD      . aspirin suppository 300 mg  300 mg Rectal Daily Shalhoub, Deno Lunger, MD   300 mg at 12/30/19 1308   Or  . aspirin tablet 325 mg  325 mg Oral Daily Shalhoub, Deno Lunger, MD      . dextrose 5 %-0.9 % sodium chloride infusion   Intravenous Continuous [place], Eric J, DO 50 mL/hr at 12/30/19 0606 New Bag at 12/30/19 0606  . enalaprilat (VASOTEC) injection 1.25 mg  1.25 mg Intravenous Q6H Shalhoub, Deno Lunger, MD   1.25 mg at 12/30/19 1228  . hydrALAZINE (APRESOLINE) injection 10 mg  10 mg Intravenous Q6H PRN Shalhoub, Deno Lunger, MD      . lacosamide (VIMPAT) 200 mg in sodium chloride 0.9 % 25 mL IVPB  200 mg Intravenous Q12H Rejeana Brock, MD 90 mL/hr at 12/30/19 1035 200 mg at 12/30/19 1035  . levETIRAcetam (KEPPRA) IVPB 1000 mg/100 mL premix  1,000 mg Intravenous Q12H Erick Blinks, MD 400 mL/hr at 12/30/19 0820 1,000 mg at 12/30/19 0820  . metoprolol tartrate (LOPRESSOR) injection 5 mg  5 mg Intravenous Q6H Shalhoub, Deno Lunger, MD   5 mg at 12/30/19 1228     Discharge Medications: Please see discharge summary for a list of discharge medications.  Relevant Imaging Results:  Relevant Lab Results:   Additional Information SSN: [id number]. Vaccinated.  Mearl Latin, LCSW

## 2019-12-30 NOTE — Progress Notes (Signed)
PROGRESS NOTE    [name]  BPZ:[id number] DOB: 27-Jun-1940 DOA: 12/27/2019 PCP: [name], MD    Brief Narrative:  [name] is a 79 year old female with past medical history significant for seizure disorder, dementia, CKD stage IIIb, and ICM, chronic combined systolic and diastolic congestive heart failure, history of ischemic CVA 2014, gout, essential hypertension who presented to the ED via EMS following breakthrough seizure.  Patient was given IV benzodiazepines via EMS and loaded on IV Keppra in the ED.  Neurology was consulted.  CT head showed no acute intracranial abnormality and CTA head/neck with no large vessel occlusion.  EEG monitoring initiated.  Hospitalist service consulted for further evaluation and management.   Assessment & Plan:   Principal Problem:   Witnessed seizure-like activity (HCC) Active Problems:   Seizure (HCC)   Hyperkalemia   Chronic combined systolic and diastolic congestive heart failure (HCC)   Chronic kidney disease, stage 3b (HCC)   Alzheimer's dementia without behavioral disturbance (HCC)   Gout   Leukocytosis   Acute metabolic encephalopathy Epilepsy with breakthrough seizure Aphasia Patient presenting from home after being found down with convulsive/generalized shaking seizure activity.  Was given midazolam by EMS and loaded with IV Keppra on arrival to ED.  Convulsions had subsided but continues with confusion which is far from her normal baseline. CT head with no acute intracranial abnormality but does note moderate/advanced chronic microvascular ischemic disease and remote infarcts.  CT head/neck with no large vessel occlusion. EEG with findings of severe cortical irritability left parieto-occipital region. MR brain w/o contrast with no acute intracranial normality but notes severe chronic small vessel ischemic disease with multiple chronic infarcts and chronic microhemorrhages. --Neurology following, appreciate  assistance --Keppra 1000 mg IV BID --Vimpat 200mg  IV BID --Seizure precautions --Continue PT/OT/SLP efforts while inpatient --Currently n.p.o. until mentation improves  Elevated troponin Patient was noted to have an elevated troponin of 1220.  Cardiology overnight was called, suspect this is from type II demand ischemia secondary to seizure as above.  EKG with normal sinus rhythm, rate 79, QTc 536, no concerning ST elevation/depressions or T wave inversions. --Troponin 1220>926>398 --Continue to monitor on telemetry  Hypoglycemia: Resolved Glucose 111 this morning, likely secondary to n.p.o. status and lethargy. --D5 NS at 50 mL's per hour --Continue to closely monitor CBGs --Hypoglycemic protocol --Currently n.p.o. until SLP clears  Essential hypertension Home medications include amlodipine 10 mg p.o. daily, carvedilol 12.5 mg twice daily, losartan 100mg  p.o. daily --Currently n.p.o. due to mental status --Metoprolol 5 mg IV every 6 hours --Vasotec 1.25 mg IV every 6 hours --Hydralazine IV as needed  Chronic combined systolic and diastolic congestive heart failure, compensated Nonischemic cardiomyopathy Follows with cardiology outpatient, Dr. .  TTE 10/16/2019 with LVEF 30-35%, LV moderately decreased function with global hypokinesis, trivial MR, moderate TR, mild AR, normal IVC. TTE 11/27 with LVEF 40-45%, LV demonstrates global hypokinesis, trivial MR, mild AR, LA moderately dilated, IVC normal, no evidence of intra-atrial shunt. --Holding home carvedilol and losartan due to inability tolerate oral intake currently --Continue IV metoprolol and Vasotec as above --Strict I's and O's and daily weights  CKD stage IIIb Baseline creatinine 1.5.  Creatinine on admission 1.68, at baseline.  On sodium bicarbonate outpatient. --Cr 1.68>1.41>1.45>1.42 --Continue gentle IV fluid hydration while n.p.o. --Avoid nephrotoxins, renally dose all medications --Strict I's and O's and  daily weights  Alzheimer's dementia On donepezil 5 mg p.o. nightly at home. --Holding oral meds secondary to mental status --Delirium precautions --Get  up during the day --Encourage a familiar face to remain present throughout the day --Keep blinds open and lights on during daylight hours --Minimize the use of opioids/benzodiazepines   DVT prophylaxis: SCDs Code Status: Full code Family Communication: No family present at bedside this morning  Disposition Plan:  Status is: Observation  The patient will require care spanning > 2 midnights and should be moved to inpatient because: Altered mental status, Ongoing diagnostic testing needed not appropriate for outpatient work up, Unsafe d/c plan, IV treatments appropriate due to intensity of illness or inability to take PO and Inpatient level of care appropriate due to severity of illness patient currently unable to tolerate oral intake due to mental status/lethargy stemming from breakthrough seizures from underlying epilepsy.  Continues to require IV epileptics.  Dispo: The patient is from: Home              Anticipated d/c is to: To be determined              Anticipated d/c date is: 2 days              Patient currently is not medically stable to d/c.   Consultants:   Neurology  Procedures:   Continuous EEG  TTE  Antimicrobials:   None   Subjective: Patient seen and examined bedside, resting comfortably, continues with significant aphasia.  No appreciable movement of right lower extremity, mild movement of right upper extremity.  Answers all questions with "April 27" or "[name]" and not following commands appropriately. Continues on IV antiepileptics. Unable to obtain any further ROS/history from patient due to her current and mental status.  No acute concerns per nursing staff.  Objective: Vitals:   12/30/19 0400 12/30/19 0500 12/30/19 0600 12/30/19 0827  BP:    (!) 146/109  Pulse: 74 76 69 76  Resp: 14 15 16 18    Temp:    98.2 F (36.8 C)  TempSrc:    Axillary  SpO2: 99% 99% 96% 97%  Weight:      Height:        Intake/Output Summary (Last 24 hours) at 12/30/2019 1036 Last data filed at 12/30/2019 0606 Gross per 24 hour  Intake 1320.78 ml  Output --  Net 1320.78 ml   Filed Weights   12/27/19 2345  Weight: 36.4 kg    Examination:  General exam: Appears calm and comfortable, confused/altered with persistent aphasia Respiratory system: Clear to auscultation. Respiratory effort normal.  On room air.  Cardiovascular system: S1 & S2 heard, RRR. No JVD, murmurs, rubs, gallops or clicks. No pedal edema. Gastrointestinal system: Abdomen is nondistended, soft and nontender. No organomegaly or masses felt. Normal bowel sounds heard. Central nervous system: Alert.  Moving extremities independently but not to command Extremities: Decreased movement right lower extremity, minimal movement right upper extremity, does not move extremities to command Skin: No rashes, lesions or ulcers Psychiatry: Unable to assess appropriately secondary to mental status    Data Reviewed: I have personally reviewed following labs and imaging studies  CBC: Recent Labs  Lab 12/27/19 1439 12/29/19 0218  WBC 10.7* 8.9  NEUTROABS 9.6*  --   HGB 11.6* 10.7*  HCT 37.5 33.0*  MCV 102.7* 97.9  PLT 239 236   Basic Metabolic Panel: Recent Labs  Lab 12/27/19 1439 12/28/19 0313 12/29/19 0218 12/30/19 0428  NA 141 138 139 141  K 5.2* 4.3 4.0 3.7  CL 107 105 109 110  CO2 20* 19* 19* 19*  GLUCOSE 70 66*  104* 111*  BUN 28* 25* 24* 18  CREATININE 1.68* 1.41* 1.45* 1.42*  CALCIUM 10.0 9.4 9.3 9.2  MG 2.3  --  1.9 1.9   GFR: Estimated Creatinine Clearance: 19.1 mL/min (A) (by C-G formula based on SCr of 1.42 mg/dL (H)). Liver Function Tests: Recent Labs  Lab 12/27/19 1439 12/28/19 0313  AST 23 33  ALT 13 15  ALKPHOS 59 57  BILITOT 0.8 0.8  PROT 8.2* 7.2  ALBUMIN 4.4 3.9   No results for input(s):  LIPASE, AMYLASE in the last 168 hours. No results for input(s): AMMONIA in the last 168 hours. Coagulation Profile: Recent Labs  Lab 12/28/19 0313  INR 1.1   Cardiac Enzymes: Recent Labs  Lab 12/27/19 1439  CKTOTAL 140   BNP (last 3 results) No results for input(s): PROBNP in the last 8760 hours. HbA1C: Recent Labs    12/28/19 0313  HGBA1C 5.4   CBG: Recent Labs  Lab 12/28/19 1113 12/28/19 1641 12/29/19 0828 12/29/19 1222 12/29/19 1627  GLUCAP 106* 87 130* 109* 109*   Lipid Profile: Recent Labs    12/28/19 0313  CHOL 189  HDL 87  LDLCALC 85  TRIG 84  CHOLHDL 2.2   Thyroid Function Tests: No results for input(s): TSH, T4TOTAL, FREET4, T3FREE, THYROIDAB in the last 72 hours. Anemia Panel: No results for input(s): VITAMINB12, FOLATE, FERRITIN, TIBC, IRON, RETICCTPCT in the last 72 hours. Sepsis Labs: No results for input(s): PROCALCITON, LATICACIDVEN in the last 168 hours.  Recent Results (from the past 240 hour(s))  Resp Panel by RT-PCR (Flu A&B, Covid) Nasopharyngeal Swab     Status: None   Collection Time: 12/27/19  2:55 PM   Specimen: Nasopharyngeal Swab; Nasopharyngeal(NP) swabs in vial transport medium  Result Value Ref Range Status   SARS Coronavirus 2 by RT PCR NEGATIVE NEGATIVE Final    Comment: (NOTE) SARS-CoV-2 target nucleic acids are NOT DETECTED.  The SARS-CoV-2 RNA is generally detectable in upper respiratory specimens during the acute phase of infection. The lowest concentration of SARS-CoV-2 viral copies this assay can detect is 138 copies/mL. A negative result does not preclude SARS-Cov-2 infection and should not be used as the sole basis for treatment or other patient management decisions. A negative result may occur with  improper specimen collection/handling, submission of specimen other than nasopharyngeal swab, presence of viral mutation(s) within the areas targeted by this assay, and inadequate number of viral copies(<138  copies/mL). A negative result must be combined with clinical observations, patient history, and epidemiological information. The expected result is Negative.  Fact Sheet for Patients:  BloggerCourse.com  Fact Sheet for Healthcare Providers:  SeriousBroker.it  This test is no t yet approved or cleared by the [place] FDA and  has been authorized for detection and/or diagnosis of SARS-CoV-2 by FDA under an Emergency Use Authorization (EUA). This EUA will remain  in effect (meaning this test can be used) for the duration of the COVID-19 declaration under Section 564(b)(1) of the Act, 21 U.S.C.section 360bbb-3(b)(1), unless the authorization is terminated  or revoked sooner.       Influenza A by PCR NEGATIVE NEGATIVE Final   Influenza B by PCR NEGATIVE NEGATIVE Final    Comment: (NOTE) The Xpert Xpress SARS-CoV-2/FLU/RSV plus assay is intended as an aid in the diagnosis of influenza from Nasopharyngeal swab specimens and should not be used as a sole basis for treatment. Nasal washings and aspirates are unacceptable for Xpert Xpress SARS-CoV-2/FLU/RSV testing.  Fact Sheet for Patients: BloggerCourse.com  Fact Sheet for Healthcare Providers: SeriousBroker.it  This test is not yet approved or cleared by the [place] FDA and has been authorized for detection and/or diagnosis of SARS-CoV-2 by FDA under an Emergency Use Authorization (EUA). This EUA will remain in effect (meaning this test can be used) for the duration of the COVID-19 declaration under Section 564(b)(1) of the Act, 21 U.S.C. section 360bbb-3(b)(1), unless the authorization is terminated or revoked.  Performed at [facility], [address] [place], [place] [address]          Radiology Studies: MR BRAIN WO CONTRAST  Result Date: 12/29/2019 CLINICAL DATA:  Seizure. Left lateralized periodic  discharges on EEG. EXAM: MRI HEAD WITHOUT CONTRAST TECHNIQUE: Multiplanar, multiecho pulse sequences of the brain and surrounding structures were obtained without intravenous contrast. COMPARISON:  Head CT and CTA 12/27/2019 and MRI 08/29/2019 FINDINGS: Brain: No acute infarct, mass, midline shift, or extra-axial fluid collection is identified. Numerous chronic microhemorrhages throughout both cerebral hemispheres have not significantly changed from the prior MRI, located both superficially as well as within the deep gray nuclei. There is a single focus of chronic microhemorrhage in the right cerebellum which was not apparent on the prior MRI. A chronic infarct is again noted in the medial left frontoparietal region/cingulate gyrus with associated chronic blood products. There are also unchanged chronic lacunar infarcts in the basal ganglia, deep cerebral white matter bilaterally, and right thalamus. Patchy and confluent T2 hyperintensities elsewhere in the cerebral white matter and pons are unchanged and nonspecific but compatible with severe chronic small vessel ischemic disease. A small chronic right cerebellar infarct is unchanged. There is moderate generalized cerebral atrophy. Dedicated temporal lobe imaging is motion degraded which limits detailed assessment, however the hippocampi are grossly symmetric in size and signal. Vascular: Major intracranial vascular flow voids are preserved. Skull and upper cervical spine: Unremarkable bone marrow signal. Sinuses/Orbits: Bilateral cataract extraction. Chronic left sphenoid sinusitis. Clear mastoid air cells. Other: None. IMPRESSION: 1. No acute intracranial abnormality. 2. Severe chronic small vessel ischemic disease with multiple chronic infarcts and chronic microhemorrhages as above. Electronically Signed   By: Sebastian Ache M.D.   On: 12/29/2019 18:58        Scheduled Meds: . aspirin  300 mg Rectal Daily   Or  . aspirin  325 mg Oral Daily  .  enalaprilat  1.25 mg Intravenous Q6H  . metoprolol tartrate  5 mg Intravenous Q6H   Continuous Infusions: . dextrose 5 % and 0.9% NaCl 50 mL/hr at 12/30/19 0606  . lacosamide (VIMPAT) IV 200 mg (12/30/19 1035)  . levETIRAcetam 1,000 mg (12/30/19 0820)     LOS: 2 days    Time spent: 39 minutes spent on chart review, discussion with nursing staff, consultants, updating family and interview/physical exam; more than 50% of that time was spent in counseling and/or coordination of care.    Alvira Philips [place], DO [facility] Available via Epic secure chat 7am-7pm After these hours, please refer to coverage provider listed on [email or web address] 12/30/2019, 10:36 AM

## 2019-12-30 NOTE — Progress Notes (Addendum)
Subjective: No clinical seizures overnight.  Slightly more verbal today.  ROS: Unable to obtain due to poor mental status  Examination  Vital signs in last 24 hours: Temp:  [97.7 F (36.5 C)-98.8 F (37.1 C)] 98.2 F (36.8 C) (11/29 0827) Pulse Rate:  [69-83] 76 (11/29 0827) Resp:  [13-21] 18 (11/29 0827) BP: (146-187)/(79-109) 146/109 (11/29 0827) SpO2:  [96 %-100 %] 97 % (11/29 0827)  General: lying in bed, not in apparent distress CVS: pulse-normal rate and rhythm RS: breathing comfortably, CTA B Extremities: normal, warm  Neuro: MS: Alert, able to look at the examiner, spontaneously says words like "I am okay", able to name 2 out of 3 objects, perseverates, unable to follow commands, does not mimic CN: pupils equal and reactive,  EOMI, face symmetric, tongue midline, normal sensation over face, Motor: Antigravity strength in all 4 extremities Reflexes: 2+ bilaterally over patella, biceps  Basic Metabolic Panel: Recent Labs  Lab 12/27/19 1439 12/27/19 1439 12/28/19 0313 12/29/19 0218 12/30/19 0428  NA 141  --  138 139 141  K 5.2*  --  4.3 4.0 3.7  CL 107  --  105 109 110  CO2 20*  --  19* 19* 19*  GLUCOSE 70  --  66* 104* 111*  BUN 28*  --  25* 24* 18  CREATININE 1.68*  --  1.41* 1.45* 1.42*  CALCIUM 10.0   < > 9.4 9.3 9.2  MG 2.3  --   --  1.9 1.9   < > = values in this interval not displayed.    CBC: Recent Labs  Lab 12/27/19 1439 12/29/19 0218  WBC 10.7* 8.9  NEUTROABS 9.6*  --   HGB 11.6* 10.7*  HCT 37.5 33.0*  MCV 102.7* 97.9  PLT 239 236     Coagulation Studies: Recent Labs    12/28/19 0313  LABPROT 13.5  INR 1.1    Imaging MRI brain without contrast 12/21/2019: No acute intracranial abnormality.A chronic infarct is again noted in the medial left frontoparietal  region/cingulate gyrus with associated chronic blood products. There are also unchanged chronic lacunar infarcts in the basal ganglia, deep cerebral white matter bilaterally, and  right thalamus. A small chronic right cerebellar infarct is unchanged.  ASSESSMENT AND PLAN: 79 year old female with multiple chronic infarcts who presented with seizures.  EEG initially showed left posterior quadrant PLEDs.  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  Therefore it is likely that patient's aphasia is postictal.  Recommendations -Continue Keppra 1000 mg twice daily (renally dosed), lacosamide 200 mg twice daily.  -If patient has any further clinical seizures, worsening speech will consider adding valproic acid -Patient has continued to be hypertensive, discussed with Dr. [place] to adjust antihypertensives with goal SBP less than 140 -Continue seizure precautions - As needed IV Ativan for generalized tonic-clonic seizure lasting more than 2 minutes -Speech therapy, okay to proceed with swallow eval from neurology standpoint -If patient passes swallow eval and is able to demonstrate safe swallow, okay to transition patient medications to p.o. tomorrow. -Management of rest of comorbidities per primary team  I have spent a total of  40 minutes with the patient reviewing hospital notes,  test results, labs and examining the patient as well as establishing an assessment and plan.  > 50% of time was spent in direct patient care.   Lindie Spruce Epilepsy [facility] For questions after 5pm please refer to AMION to reach the Neurologist on call

## 2019-12-30 NOTE — TOC Initial Note (Addendum)
Transition of Care [facility]) - Initial/Assessment Note    Patient Details  Name: [name] MRN: [id number] Date of Birth: 1940-09-08  Transition of Care [facility]) CM/SW Contact:    Mearl Latin, LCSW Phone Number: 12/30/2019, 2:01 PM  Clinical Narrative:                 2pm-CSW received consult for possible SNF placement at time of discharge. CSW spoke with patient's son at bedside regarding PT recommendation of SNF placement at time of discharge. Patient's son reported that he is currently unable to care for patient at their home given patient's current physical needs and fall risk and patient normally lives alone. Patient's son expressed understanding of PT recommendation and is agreeable to SNF placement at time of discharge. CSW discussed insurance authorization process and provided Medicare SNF ratings list. Patient has received the COVID vaccines. CSW will provide bed offers once available.  5:52 PM-CSW provided SNF bed offers to patient's son for review.    Expected Discharge Plan: Skilled Nursing Facility Barriers to Discharge: Continued Medical Work up, SNF Pending bed offer, Insurance Authorization   Patient Goals and CMS Choice Patient states their goals for this hospitalization and ongoing recovery are:: Rehab CMS Medicare.gov Compare Post Acute Care list provided to:: Patient Represenative (must comment) Choice offered to / list presented to : Adult Children  Expected Discharge Plan and Services Expected Discharge Plan: Skilled Nursing Facility In-house Referral: Clinical Social Work   Post Acute Care Choice: Skilled Nursing Facility Living arrangements for the past 2 months: Single Family Home                                      Prior Living Arrangements/Services Living arrangements for the past 2 months: Single Family Home Lives with:: Self Patient language and need for interpreter reviewed:: Yes Do you feel safe going back to the place where you  live?: No   Lives alone  Need for Family Participation in Patient Care: Yes (Comment) Care giver support system in place?: No (comment)   Criminal Activity/Legal Involvement Pertinent to Current Situation/Hospitalization: No - Comment as needed  Activities of Daily Living      Permission Sought/Granted Permission sought to share information with : Facility Medical sales representative, Family Supports Permission granted to share information with : No  Share Information with NAME: [name]  Permission granted to share info w AGENCY: SNFs  Permission granted to share info w Relationship: Son  Permission granted to share info w Contact Information: [phone]  Emotional Assessment Appearance:: Appears stated age Attitude/Demeanor/Rapport: Unable to Assess Affect (typically observed): Unable to Assess Orientation: :  (Disoriented) Alcohol / Substance Use: Not Applicable Psych Involvement: No (comment)  Admission diagnosis:  AMS (altered mental status) [R41.82] Seizure (HCC) [R56.9] Patient Active Problem List   Diagnosis Date Noted  . Witnessed seizure-like activity (HCC) 12/27/2019  . Chronic kidney disease, stage 3b (HCC) 12/27/2019  . Alzheimer's dementia without behavioral disturbance (HCC) 12/27/2019  . Gout 12/27/2019  . Leukocytosis 12/27/2019  . Cough   . Encounter for palliative care   . Goals of care, counseling/discussion   . Pneumonia   . NICM (nonischemic cardiomyopathy) -EF 25% 05/20/2015  . No significant CAD at cath May 2012 05/20/2015  . Anemia 05/20/2015  . Diastolic dysfunction-grade 3 05/20/2015  . PNA (pneumonia) 05/20/2015  . Acute renal failure superimposed on stage 3 chronic kidney disease (HCC)   .  CAP (community acquired pneumonia)   . Hyperammonemia (HCC) 05/17/2015  . Metabolic encephalopathy 05/16/2015  . Acute on chronic renal insufficiency 05/15/2015  . Chest pain 05/14/2015  . Troponin level elevated 05/14/2015  . Chronic combined systolic and  diastolic congestive heart failure (HCC) 03/06/2014  . Acute combined systolic and diastolic congestive heart failure (HCC) 02/24/2013  . Weight gain 02/24/2013  . Volume overload 02/24/2013  . Hyperglycemia 02/24/2013  . Dyspnea 02/24/2013  . Constipation 02/24/2013  . Abdominal pain 02/24/2013  . Pleuritic chest pain 02/24/2013  . Epilepsy (HCC)   . Smoker   . 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 Protein-calorie malnutrition, severe- wgt 86 lbs 02/13/2013  . Pneumobilia 02/12/2013  . Influenza A 02/11/2013  . Hyperkalemia 02/11/2013  . Hyponatremia 02/11/2013  . HCAP (healthcare-associated pneumonia) 02/11/2013  . Liver enzyme elevation 02/11/2013  . Basal ganglia infarction (HCC) 01/08/2013  . H/O: CVA (cerebrovascular accident) 01/04/2013  . Generalized seizure (HCC) 12/28/2012  . 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 Seizure (HCC) 12/28/2012  . Metabolic acidosis 12/28/2012  . Encephalopathy 12/28/2012  . Acute renal failure (HCC) 12/28/2012   PCP:  [name], MD Pharmacy:   [facility] [id number] - [place], [place] - [address] AT [facility] OF [address] [place] [address] Phone: [phone] Fax: [phone]  [facility] - [place], [place] - [address] [place] [address] Phone: [phone] Fax: [phone]     Social Determinants of Health (SDOH) Interventions    Readmission Risk Interventions No flowsheet data found.
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
## 2019-12-30 NOTE — Progress Notes (Signed)
CSW spoke with patient's son by phone regarding SNF recommendation. He requested CSW meet with him once he arrives to the hospital.  Taylor Goldmann LCSW

## 2019-12-31 DIAGNOSIS — R569 Unspecified convulsions: Secondary | ICD-10-CM | POA: Diagnosis not present

## 2019-12-31 DIAGNOSIS — I5042 Chronic combined systolic (congestive) and diastolic (congestive) heart failure: Secondary | ICD-10-CM | POA: Diagnosis not present

## 2019-12-31 LAB — MAGNESIUM: Magnesium: 1.7 mg/dL (ref 1.7–2.4)

## 2019-12-31 LAB — GLUCOSE, CAPILLARY
Glucose-Capillary: 105 mg/dL — ABNORMAL HIGH (ref 70–99)
Glucose-Capillary: 101 mg/dL — ABNORMAL HIGH (ref 70–99)
Glucose-Capillary: 103 mg/dL — ABNORMAL HIGH (ref 70–99)
Glucose-Capillary: 125 mg/dL — ABNORMAL HIGH (ref 70–99)
Glucose-Capillary: 126 mg/dL — ABNORMAL HIGH (ref 70–99)
Glucose-Capillary: 126 mg/dL — ABNORMAL HIGH (ref 70–99)

## 2019-12-31 LAB — BASIC METABOLIC PANEL
Anion gap: 13 (ref 5–15)
BUN: 16 mg/dL (ref 8–23)
CO2: 20 mmol/L — ABNORMAL LOW (ref 22–32)
Calcium: 9.2 mg/dL (ref 8.9–10.3)
Chloride: 109 mmol/L (ref 98–111)
Creatinine, Ser: 1.39 mg/dL — ABNORMAL HIGH (ref 0.44–1.00)
GFR, Estimated: 39 mL/min — ABNORMAL LOW (ref >60)
Glucose, Bld: 104 mg/dL — ABNORMAL HIGH (ref 70–99)
Potassium: 3.6 mmol/L (ref 3.5–5.1)
Sodium: 142 mmol/L (ref 135–145)

## 2019-12-31 MED ORDER — LEVETIRACETAM 500 MG PO TABS
1000.0000 mg | ORAL_TABLET | Freq: Two times a day (BID) | ORAL | Status: DC
  Administered 2019-12-31 – 2020-01-02 (×5): 1000 mg via ORAL
  Filled 2019-12-31 (×5): qty 2

## 2019-12-31 MED ORDER — LOSARTAN POTASSIUM 50 MG PO TABS
50.0000 mg | ORAL_TABLET | Freq: Every morning | ORAL | Status: DC
  Administered 2019-12-31 – 2020-01-02 (×3): 50 mg via ORAL
  Filled 2019-12-31 (×3): qty 1

## 2019-12-31 MED ORDER — LACOSAMIDE 200 MG PO TABS
200.0000 mg | ORAL_TABLET | Freq: Two times a day (BID) | ORAL | Status: DC
  Administered 2019-12-31 – 2020-01-02 (×5): 200 mg via ORAL
  Filled 2019-12-31 (×5): qty 1

## 2019-12-31 NOTE — Progress Notes (Signed)
Subjective: No acute events overnight.  Patient family member CJ/Charlie bedside states she is significantly better compared to day before yesterday when he saw her.  ROS: Unable to obtain due to poor mental status  Examination  Vital signs in last 24 hours: Temp:  [98.1 F (36.7 C)-99.4 F (37.4 C)] 98.3 F (36.8 C) (11/30 0745) Pulse Rate:  [81-91] 91 (11/30 0745) Resp:  [14-19] 14 (11/30 0745) BP: (149-164)/(86-92) 149/89 (11/30 0745) SpO2:  [96 %-99 %] 96 % (11/30 0745)  General: lying in bed, not in apparent distress CVS: pulse-normal rate and rhythm RS: breathing comfortably, CTA B Extremities: normal, warm  Neuro: MS: Awake, alert, oriented to self, place (took some time but was able to tell me she is at [facility]) able to name 5/5 objects but did perseverate repeatedly on Charlie's name, able to follow simple one-step commands CN: pupils equal and reactive,  EOMI, face symmetric, tongue midline, normal sensation over face, Motor: Antigravity strength in all 4 extremities  Basic Metabolic Panel: Recent Labs  Lab 12/27/19 1439 12/27/19 1439 12/28/19 0313 12/28/19 0313 12/29/19 0218 12/30/19 0428 12/31/19 0412  NA 141  --  138  --  139 141 142  K 5.2*  --  4.3  --  4.0 3.7 3.6  CL 107  --  105  --  109 110 109  CO2 20*  --  19*  --  19* 19* 20*  GLUCOSE 70  --  66*  --  104* 111* 104*  BUN 28*  --  25*  --  24* 18 16  CREATININE 1.68*  --  1.41*  --  1.45* 1.42* 1.39*  CALCIUM 10.0   < > 9.4   < > 9.3 9.2 9.2  MG 2.3  --   --   --  1.9 1.9 1.7   < > = values in this interval not displayed.    CBC: Recent Labs  Lab 12/27/19 1439 12/29/19 0218  WBC 10.7* 8.9  NEUTROABS 9.6*  --   HGB 11.6* 10.7*  HCT 37.5 33.0*  MCV 102.7* 97.9  PLT 239 236     Coagulation Studies: No results for input(s): LABPROT, INR in the last 72 hours.  Imaging No new brain imaging overnight  ASSESSMENT AND PLAN: 79 year old female with multiple chronic infarcts  who presented with seizures.  EEG initially showed left posterior quadrant PLEDs.  Epilepsy due to underlying stroke Chronic infarcts -No further seizures. Speech is gradually improving.  Recommendations -Continue Keppra 1000 mg twice daily (renally dosed), lacosamide 200 mg twice daily.  -If patient has any further clinical seizures, worsening speech will consider adding valproic acid -Continue seizure precautions - As needed IV Ativan for generalized tonic-clonic seizure lasting more than 2 minutes -Follow-up with neurology in 8 to 10 weeks after discharge -Discussed plan in detail with patient's family member CJ/Charlie at bedside.  Also discussed that patient may have prolonged recovery due to her poor neurologic baseline and may not return to her baseline prior to admission. -Management of rest of comorbidities per primary team  Thank you for allowing [place] to participate in the care of this patient.  Neurology will sign off.  Please contact us for any further questions.  I have spent a total of 35 minuteswith the patient reviewing hospitalnotes,  test results, labs and examining the patient as well as establishing an assessment and plan.>50% of time was spent in direct patient care.  Lindie Spruce Epilepsy [facility] For questions after  5pm please refer to AMION to reach the Neurologist on call

## 2019-12-31 NOTE — Progress Notes (Signed)
PROGRESS NOTE    [name]  ERD:[id number] DOB: 1940/07/21 DOA: 12/27/2019 PCP: [name], MD    Brief Narrative:  [name] is a 79 year old female with past medical history significant for seizure disorder, dementia, CKD stage IIIb, and ICM, chronic combined systolic and diastolic congestive heart failure, history of ischemic CVA 2014, gout, essential hypertension who presented to the ED via EMS following breakthrough seizure.  Patient was given IV benzodiazepines via EMS and loaded on IV Keppra in the ED.  Neurology was consulted.  CT head showed no acute intracranial abnormality and CTA head/neck with no large vessel occlusion.  EEG monitoring initiated.  Hospitalist service consulted for further evaluation and management.   Assessment & Plan:   Principal Problem:   Witnessed seizure-like activity (HCC) Active Problems:   Seizure (HCC)   Hyperkalemia   Chronic combined systolic and diastolic congestive heart failure (HCC)   Chronic kidney disease, stage 3b (HCC)   Alzheimer's dementia without behavioral disturbance (HCC)   Gout   Leukocytosis   Acute metabolic encephalopathy Epilepsy with breakthrough seizure Aphasia Patient presenting from home after being found down with convulsive/generalized shaking seizure activity.  Was given midazolam by EMS and loaded with IV Keppra on arrival to ED.  Convulsions had subsided but continues with confusion which is far from her normal baseline. CT head with no acute intracranial abnormality but does note moderate/advanced chronic microvascular ischemic disease and remote infarcts.  CT head/neck with no large vessel occlusion. EEG with findings of severe cortical irritability left parieto-occipital region. MR brain w/o contrast with no acute intracranial normality but notes severe chronic small vessel ischemic disease with multiple chronic infarcts and chronic microhemorrhages. --Neurology following, appreciate  assistance --Keppra 1000 mg PO BID --Vimpat 200mg  PO BID --Seizure precautions --Continue PT/OT/SLP efforts while inpatient --Now cleared for regular diet with thin liquids --Pending SNF placement  Left upper extremity edema --Discontinue IV fluids; now can tolerate p.o. intake --Check vascular duplex ultrasound left upper extremity  Elevated troponin Patient was noted to have an elevated troponin of 1220.  Cardiology overnight was called, suspect this is from type II demand ischemia secondary to seizure as above.  EKG with normal sinus rhythm, rate 79, QTc 536, no concerning ST elevation/depressions or T wave inversions. --Troponin 1220>926>398 --Continue to monitor on telemetry  Hypoglycemia: Resolved Glucose 111 this morning, likely secondary to n.p.o. status and lethargy. --DC IV fluids now transition to diet --Continue to closely monitor CBGs --Hypoglycemic protocol  Essential hypertension Home medications include amlodipine 10 mg p.o. daily, carvedilol 12.5 mg twice daily, losartan 100mg  p.o. daily --Amlodipine 10 mg p.o. daily --Carvedilol 12.5 mg p.o. twice daily --At home losartan at reduced dose 50 mg p.o. daily today --Continue monitor blood pressure and creatinine closely, uptitrate losartan to home dose if able  Chronic combined systolic and diastolic congestive heart failure, compensated Nonischemic cardiomyopathy Follows with cardiology outpatient, Dr. .  TTE 10/16/2019 with LVEF 30-35%, LV moderately decreased function with global hypokinesis, trivial MR, moderate TR, mild AR, normal IVC. TTE 11/27 with LVEF 40-45%, LV demonstrates global hypokinesis, trivial MR, mild AR, LA moderately dilated, IVC normal, no evidence of intra-atrial shunt. --Holding home carvedilol and losartan due to inability tolerate oral intake currently --Continue IV metoprolol and Vasotec as above --Strict I's and O's and daily weights  CKD stage IIIb Baseline creatinine 1.5.   Creatinine on admission 1.68, at baseline.  On sodium bicarbonate outpatient. --Cr 1.68>1.41>1.45>1.42>1.39 --Continue gentle IV fluid hydration while n.p.o. --Avoid  nephrotoxins, renally dose all medications --Strict I's and O's and daily weights  Alzheimer's dementia On donepezil 5 mg p.o. nightly at home. --Holding oral meds secondary to mental status --Delirium precautions --Get up during the day --Encourage a familiar face to remain present throughout the day --Keep blinds open and lights on during daylight hours --Minimize the use of opioids/benzodiazepines   DVT prophylaxis: SCDs Code Status: Full code Family Communication: No family present at bedside this morning  Disposition Plan:  Status is: Observation  The patient will require care spanning > 2 midnights and should be moved to inpatient because: Altered mental status, Ongoing diagnostic testing needed not appropriate for outpatient work up, Unsafe d/c plan, IV treatments appropriate due to intensity of illness or inability to take PO and Inpatient level of care appropriate due to severity of illness patient currently unable to tolerate oral intake due to mental status/lethargy stemming from breakthrough seizures from underlying epilepsy.  Continues to require IV epileptics.  Dispo: The patient is from: Home              Anticipated d/c is to: SNF              Anticipated d/c date is: 1 day              Patient currently is medically stable to d/c.   Consultants:   Neurology  Procedures:   Continuous EEG  TTE  Left upper extremity vascular ultrasound: Pending  Antimicrobials:   None   Subjective: Patient seen and examined bedside, resting comfortably, continues with significant aphasia.  Continues to progress slowly with PT.  Diet now advanced to regular with thin liquids.  No family present at bedside this morning. Unable to obtain any further ROS/history from patient due to her current and mental status.   No acute concerns per nursing staff.  Objective: Vitals:   12/30/19 2000 12/30/19 2319 12/31/19 0425 12/31/19 0745  BP:  (!) 149/86 (!) 151/92 (!) 149/89  Pulse: 85 89 86 91  Resp: 18 19 18 14   Temp:  98.1 F (36.7 C) 98.4 F (36.9 C) 98.3 F (36.8 C)  TempSrc:  Axillary Oral Axillary  SpO2: 96% 99% 96% 96%  Weight:      Height:        Intake/Output Summary (Last 24 hours) at 12/31/2019 1232 Last data filed at 12/31/2019 0600 Gross per 24 hour  Intake 999.49 ml  Output 700 ml  Net 299.49 ml   Filed Weights   12/27/19 2345  Weight: 36.4 kg    Examination:  General exam: Appears calm and comfortable, confused/altered with persistent aphasia Respiratory system: Clear to auscultation. Respiratory effort normal.  On room air.  Cardiovascular system: S1 & S2 heard, RRR. No JVD, murmurs, rubs, gallops or clicks. No pedal edema. Gastrointestinal system: Abdomen is nondistended, soft and nontender. No organomegaly or masses felt. Normal bowel sounds heard. Central nervous system: Alert.  Moving extremities independently but not to command Extremities: Moving all extremities independently Skin: No rashes, lesions or ulcers Psychiatry: Unable to assess, continues with aphasia    Data Reviewed: I have personally reviewed following labs and imaging studies  CBC: Recent Labs  Lab 12/27/19 1439 12/29/19 0218  WBC 10.7* 8.9  NEUTROABS 9.6*  --   HGB 11.6* 10.7*  HCT 37.5 33.0*  MCV 102.7* 97.9  PLT 239 236   Basic Metabolic Panel: Recent Labs  Lab 12/27/19 1439 12/28/19 0313 12/29/19 0218 12/30/19 0428 12/31/19 0412  NA 141  138 139 141 142  K 5.2* 4.3 4.0 3.7 3.6  CL 107 105 109 110 109  CO2 20* 19* 19* 19* 20*  GLUCOSE 70 66* 104* 111* 104*  BUN 28* 25* 24* 18 16  CREATININE 1.68* 1.41* 1.45* 1.42* 1.39*  CALCIUM 10.0 9.4 9.3 9.2 9.2  MG 2.3  --  1.9 1.9 1.7   GFR: Estimated Creatinine Clearance: 19.5 mL/min (A) (by C-G formula based on SCr of 1.39 mg/dL  (H)). Liver Function Tests: Recent Labs  Lab 12/27/19 1439 12/28/19 0313  AST 23 33  ALT 13 15  ALKPHOS 59 57  BILITOT 0.8 0.8  PROT 8.2* 7.2  ALBUMIN 4.4 3.9   No results for input(s): LIPASE, AMYLASE in the last 168 hours. No results for input(s): AMMONIA in the last 168 hours. Coagulation Profile: Recent Labs  Lab 12/28/19 0313  INR 1.1   Cardiac Enzymes: Recent Labs  Lab 12/27/19 1439  CKTOTAL 140   BNP (last 3 results) No results for input(s): PROBNP in the last 8760 hours. HbA1C: No results for input(s): HGBA1C in the last 72 hours. CBG: Recent Labs  Lab 12/30/19 1732 12/31/19 0144 12/31/19 0422 12/31/19 0747 12/31/19 1209  GLUCAP 105* 105* 101* 103* 125*   Lipid Profile: No results for input(s): CHOL, HDL, LDLCALC, TRIG, CHOLHDL, LDLDIRECT in the last 72 hours. Thyroid Function Tests: No results for input(s): TSH, T4TOTAL, FREET4, T3FREE, THYROIDAB in the last 72 hours. Anemia Panel: No results for input(s): VITAMINB12, FOLATE, FERRITIN, TIBC, IRON, RETICCTPCT in the last 72 hours. Sepsis Labs: No results for input(s): PROCALCITON, LATICACIDVEN in the last 168 hours.  Recent Results (from the past 240 hour(s))  Resp Panel by RT-PCR (Flu A&B, Covid) Nasopharyngeal Swab     Status: None   Collection Time: 12/27/19  2:55 PM   Specimen: Nasopharyngeal Swab; Nasopharyngeal(NP) swabs in vial transport medium  Result Value Ref Range Status   SARS Coronavirus 2 by RT PCR NEGATIVE NEGATIVE Final    Comment: (NOTE) SARS-CoV-2 target nucleic acids are NOT DETECTED.  The SARS-CoV-2 RNA is generally detectable in upper respiratory specimens during the acute phase of infection. The lowest concentration of SARS-CoV-2 viral copies this assay can detect is 138 copies/mL. A negative result does not preclude SARS-Cov-2 infection and should not be used as the sole basis for treatment or other patient management decisions. A negative result may occur with  improper  specimen collection/handling, submission of specimen other than nasopharyngeal swab, presence of viral mutation(s) within the areas targeted by this assay, and inadequate number of viral copies(<138 copies/mL). A negative result must be combined with clinical observations, patient history, and epidemiological information. The expected result is Negative.  Fact Sheet for Patients:  BloggerCourse.com  Fact Sheet for Healthcare Providers:  SeriousBroker.it  This test is no t yet approved or cleared by the [place] FDA and  has been authorized for detection and/or diagnosis of SARS-CoV-2 by FDA under an Emergency Use Authorization (EUA). This EUA will remain  in effect (meaning this test can be used) for the duration of the COVID-19 declaration under Section 564(b)(1) of the Act, 21 U.S.C.section 360bbb-3(b)(1), unless the authorization is terminated  or revoked sooner.       Influenza A by PCR NEGATIVE NEGATIVE Final   Influenza B by PCR NEGATIVE NEGATIVE Final    Comment: (NOTE) The Xpert Xpress SARS-CoV-2/FLU/RSV plus assay is intended as an aid in the diagnosis of influenza from Nasopharyngeal swab specimens and should not be used  as a sole basis for treatment. Nasal washings and aspirates are unacceptable for Xpert Xpress SARS-CoV-2/FLU/RSV testing.  Fact Sheet for Patients: BloggerCourse.com  Fact Sheet for Healthcare Providers: SeriousBroker.it  This test is not yet approved or cleared by the [place] FDA and has been authorized for detection and/or diagnosis of SARS-CoV-2 by FDA under an Emergency Use Authorization (EUA). This EUA will remain in effect (meaning this test can be used) for the duration of the COVID-19 declaration under Section 564(b)(1) of the Act, 21 U.S.C. section 360bbb-3(b)(1), unless the authorization is terminated or revoked.  Performed at  [facility], [address] [place], [place] [address]          Radiology Studies: MR BRAIN WO CONTRAST  Result Date: 12/29/2019 CLINICAL DATA:  Seizure. Left lateralized periodic discharges on EEG. EXAM: MRI HEAD WITHOUT CONTRAST TECHNIQUE: Multiplanar, multiecho pulse sequences of the brain and surrounding structures were obtained without intravenous contrast. COMPARISON:  Head CT and CTA 12/27/2019 and MRI 08/29/2019 FINDINGS: Brain: No acute infarct, mass, midline shift, or extra-axial fluid collection is identified. Numerous chronic microhemorrhages throughout both cerebral hemispheres have not significantly changed from the prior MRI, located both superficially as well as within the deep gray nuclei. There is a single focus of chronic microhemorrhage in the right cerebellum which was not apparent on the prior MRI. A chronic infarct is again noted in the medial left frontoparietal region/cingulate gyrus with associated chronic blood products. There are also unchanged chronic lacunar infarcts in the basal ganglia, deep cerebral white matter bilaterally, and right thalamus. Patchy and confluent T2 hyperintensities elsewhere in the cerebral white matter and pons are unchanged and nonspecific but compatible with severe chronic small vessel ischemic disease. A small chronic right cerebellar infarct is unchanged. There is moderate generalized cerebral atrophy. Dedicated temporal lobe imaging is motion degraded which limits detailed assessment, however the hippocampi are grossly symmetric in size and signal. Vascular: Major intracranial vascular flow voids are preserved. Skull and upper cervical spine: Unremarkable bone marrow signal. Sinuses/Orbits: Bilateral cataract extraction. Chronic left sphenoid sinusitis. Clear mastoid air cells. Other: None. IMPRESSION: 1. No acute intracranial abnormality. 2. Severe chronic small vessel ischemic disease with multiple chronic infarcts and chronic  microhemorrhages as above. Electronically Signed   By: Sebastian Ache M.D.   On: 12/29/2019 18:58        Scheduled Meds: . amLODipine  10 mg Oral Daily  . aspirin  300 mg Rectal Daily   Or  . aspirin  325 mg Oral Daily  . carvedilol  12.5 mg Oral BID WC  . losartan  50 mg Oral q morning - 10a   Continuous Infusions: . dextrose 5 % and 0.9% NaCl 50 mL/hr at 12/31/19 0020  . lacosamide (VIMPAT) IV 200 mg (12/30/19 2249)  . levETIRAcetam 1,000 mg (12/30/19 2231)     LOS: 3 days    Time spent: 37 minutes spent on chart review, discussion with nursing staff, consultants, updating family and interview/physical exam; more than 50% of that time was spent in counseling and/or coordination of care.    Alvira Philips [place], DO [facility] Available via Epic secure chat 7am-7pm After these hours, please refer to coverage provider listed on [email or web address] 12/31/2019, 12:32 PM

## 2019-12-31 NOTE — Plan of Care (Signed)
  Problem: Education: Goal: Knowledge of General Education information will improve Description: Including pain rating scale, medication(s)/side effects and non-pharmacologic comfort measures Outcome: Progressing   Problem: Health Behavior/Discharge Planning: Goal: Ability to manage health-related needs will improve Outcome: Progressing   Problem: Clinical Measurements: Goal: Ability to maintain clinical measurements within normal limits will improve Outcome: Progressing Goal: Will remain free from infection Outcome: Progressing Goal: Diagnostic test results will improve Outcome: Progressing Goal: Respiratory complications will improve Outcome: Progressing Goal: Cardiovascular complication will be avoided Outcome: Progressing   Problem: Activity: Goal: Risk for activity intolerance will decrease Outcome: Progressing   Problem: Nutrition: Goal: Adequate nutrition will be maintained Outcome: Progressing   Problem: Coping: Goal: Level of anxiety will decrease Outcome: Progressing   Problem: Elimination: Goal: Will not experience complications related to bowel motility Outcome: Progressing Goal: Will not experience complications related to urinary retention Outcome: Progressing   Problem: Pain Managment: Goal: General experience of comfort will improve Outcome: Progressing   Problem: Safety: Goal: Ability to remain free from injury will improve Outcome: Progressing   Problem: Skin Integrity: Goal: Risk for impaired skin integrity will decrease Outcome: Progressing   Problem: Education: Goal: Expressions of having a comfortable level of knowledge regarding the disease process will increase Outcome: Progressing   Problem: Coping: Goal: Ability to adjust to condition or change in health will improve Outcome: Progressing Goal: Ability to identify appropriate support needs will improve Outcome: Progressing   Problem: Health Behavior/Discharge Planning: Goal:  Compliance with prescribed medication regimen will improve Outcome: Progressing   Problem: Medication: Goal: Risk for medication side effects will decrease Outcome: Progressing   Problem: Clinical Measurements: Goal: Complications related to the disease process, condition or treatment will be avoided or minimized Outcome: Progressing Goal: Diagnostic test results will improve Outcome: Progressing   Problem: Safety: Goal: Verbalization of understanding the information provided will improve Outcome: Progressing   Problem: Self-Concept: Goal: Level of anxiety will decrease Outcome: Progressing Goal: Ability to verbalize feelings about condition will improve Outcome: Progressing   Problem: Education: Goal: Knowledge of disease or condition will improve Outcome: Progressing Goal: Knowledge of secondary prevention will improve Outcome: Progressing Goal: Knowledge of patient specific risk factors addressed and post discharge goals established will improve Outcome: Progressing Goal: Individualized Educational Video(s) Outcome: Progressing   Problem: Coping: Goal: Will verbalize positive feelings about self Outcome: Progressing Goal: Will identify appropriate support needs Outcome: Progressing   Problem: Health Behavior/Discharge Planning: Goal: Ability to manage health-related needs will improve Outcome: Progressing   Problem: Self-Care: Goal: Ability to participate in self-care as condition permits will improve Outcome: Progressing Goal: Verbalization of feelings and concerns over difficulty with self-care will improve Outcome: Progressing Goal: Ability to communicate needs accurately will improve Outcome: Progressing   Problem: Nutrition: Goal: Risk of aspiration will decrease Outcome: Progressing Goal: Dietary intake will improve Outcome: Progressing   Problem: Intracerebral Hemorrhage Tissue Perfusion: Goal: Complications of Intracerebral Hemorrhage will be  minimized Outcome: Progressing   Problem: Spontaneous Subarachnoid Hemorrhage Tissue Perfusion: Goal: Complications of Spontaneous Subarachnoid Hemorrhage will be minimized Outcome: Progressing

## 2019-12-31 NOTE — Progress Notes (Signed)
  Speech Language Pathology Treatment: Dysphagia;Cognitive-Linquistic  Patient Details Name: [name] MRN: [id number] DOB: 05-02-1940 Today's Date: 12/31/2019 Time: 1030-1057 SLP Time Calculation (min) (ACUTE ONLY): 27 min  Assessment / Plan / Recommendation Clinical Impression  Dysphagia treatment focused on diet toleration and potential to advance diet. With moderate tactile and minimal verbal cueing, patient able to self feed prescribed diet during am meal without overt evidence of aspiration. With advanced solid textures (regular), oral phase remains WFL despite lack of dentures which significant other states that patient does not require to consume pos. Oral phase timely and with full oral clearance. No signs of aspiration with thin liquid with swift appearing swallow initiation and clear vocal quality post swallow. At this time, patient appears to be tolerating current diet and appropriate for diet advancement to regular solids, thin liquids, with no SLP f/u for dysphagia.   Cognitive-linguistic treatment complete. Patient remains aphasic with with improvement since initial evaluation 11/29. Able to name common objects with approximately 25% accuracy and demonstrate orientation to self, place, time, and situation, with frequent perseveration, occurring in approximately 90% of exchanges, being primary barrier to effective communication. SLP provided break of approximately 3-5 minutes in between questioning with proved effective at reducing perseveration and allowing patient to verbalize appropriately at the short phrase level. Education provided to significant other regarding with technique. Patient with significant deficits in the area of sustained attention during verbal activity, requiring moderate-max cues to redirect however during non-verbal familiar tasks, able to attend with min-no cueing.   Patient continues to benefit from SLP f/u for speech, language, and cognition. Per  significant other, was living independently prior to admission however was diagnosed with mild dementia 6-7 months ago which may also be contributing to deficits.      HPI HPI: [name] is a 79 year old female with past medical history significant for seizure disorder, dementia, CKD stage IIIb, and ICM, chronic combined systolic and diastolic congestive heart failure, history of ischemic CVA 2014, gout, essential hypertension who presented to the ED via EMS following breakthrough seizure. MRI11/28 with no acute findings; CXR 11/26 clear; EEG 11/28: "evidence of epileptogenicity as well as cortical dysfunction arising from left posterior quadrant likely due to underlying structural abnormality"      SLP Plan  Other (Comment) (dyphagia goals met, continue cog-linguistic goals)       Recommendations  Diet recommendations: Regular;Thin liquid Liquids provided via: Cup;Straw Medication Administration: Whole meds with liquid Supervision: Patient able to self feed;Staff to assist with self feeding Compensations: Slow rate;Small sips/bites Postural Changes and/or Swallow Maneuvers: Seated upright 90 degrees                General recommendations: Rehab consult Oral Care Recommendations: Oral care BID Follow up Recommendations: Inpatient Rehab SLP Visit Diagnosis: Dysphagia, unspecified (R13.10);Aphasia (R47.01) Plan: Other (Comment) (dyphagia goals met, continue cog-linguistic goals)       GO             Gabriel Rainwater MA, CCC-SLP     Claudie Brickhouse Meryl 12/31/2019, 10:57 AM

## 2019-12-31 NOTE — TOC Progression Note (Signed)
Transition of Care [facility]) - Progression Note    Patient Details  Name: [name] MRN: [id number] Date of Birth: 1940/09/11  Transition of Care [facility]) CM/SW Contact  Mearl Latin, LCSW Phone Number: 12/31/2019, 1:28 PM  Clinical Narrative:    Patient's son has selected [facility]. Sonny Dandy will start insurance process, which is not managed by [employer] so could take a few days. Will need updated COVID test once closer to insurance approval.    Expected Discharge Plan: Skilled Nursing Facility Barriers to Discharge: Continued Medical Work up, SNF Pending bed offer, English as a second language teacher  Expected Discharge Plan and Services Expected Discharge Plan: Skilled Nursing Facility In-house Referral: Clinical Social Work   Post Acute Care Choice: Skilled Nursing Facility Living arrangements for the past 2 months: Single Family Home                                       Social Determinants of Health (SDOH) Interventions    Readmission Risk Interventions No flowsheet data found.

## 2019-12-31 NOTE — Care Management Important Message (Signed)
Important Message  Patient Details  Name: [name] MRN: [id number] Date of Birth: Sep 28, 1940   Medicare Important Message Given:  Yes     Dorena Bodo 12/31/2019, 2:49 PM

## 2019-12-31 NOTE — Progress Notes (Signed)
Physical Therapy Treatment Patient Details Name: [name] MRN: [id number] DOB: 1940-11-05 Today's Date: 12/31/2019    History of Present Illness 79 year old female with past medical history of systolic and diastolic congestive heart failure; nonischemic cardiomyopathy, seizure disorder, chronic kidney disease stage IIIb, previous stroke (2014), gout, hypertension, and Alzheimer's dementia who presents to [facility] emergency department via EMS after being found with suspected seizure activity by her significant other. MRI  - no acute intracranial abnormality; severe chronic small vessel ischemic disease with multiple chronic infarcts and microhemorrhages.     PT Comments    Patient progressing well towards PT goals. More alert and awake today, verbalizing some with therapist and significant other. Continues to have cognitive deficits relating to memory, attention, problem solving, safety and overall awareness. Tolerated gait training today with Min A and use of RW for support needing assist to propel RW forward and for turns. Requires Min-mod A for standing depending on fatigue level. Will continue to follow and progress as tolerated.   Follow Up Recommendations  SNF;Supervision for mobility/OOB     Equipment Recommendations  Rolling walker with 5" wheels (pending improvement)    Recommendations for Other Services       Precautions / Restrictions Precautions Precautions: Fall Precaution Comments: seizure precautions Restrictions Weight Bearing Restrictions: No    Mobility  Bed Mobility Overal bed mobility: Needs Assistance Bed Mobility: Supine to Sit     Supine to sit: Mod assist     General bed mobility comments: Able to initiate movement, difficulty getting RLE to EOB. Mod A for RLE and trunk.  Transfers Overall transfer level: Needs assistance Equipment used: Rolling walker (2 wheeled) Transfers: Sit to/from Stand Sit to Stand: Mod assist;Min  assist         General transfer comment: At first, Min A to rise from EOB, digressing to Mod A to stand from chair. Difficulty motor planning. transferred to chair post ambulation.  Ambulation/Gait Ambulation/Gait assistance: Min assist Gait Distance (Feet): 20 Feet Assistive device: Rolling walker (2 wheeled) Gait Pattern/deviations: Step-through pattern;Step-to pattern;Narrow base of support;Shuffle Gait velocity: decreased Gait velocity interpretation: <1.8 ft/sec, indicate of risk for recurrent falls General Gait Details: Slow, short steps with assist for RW navigation forward and with turns.   Stairs             Wheelchair Mobility    Modified Rankin (Stroke Patients Only)       Balance Overall balance assessment: Needs assistance Sitting-balance support: Feet supported;No upper extremity supported Sitting balance-Leahy Scale: Good Sitting balance - Comments: supervision for safety.   Standing balance support: During functional activity Standing balance-Leahy Scale: Poor Standing balance comment: Requires UE support in standing. narrow boS.                            Cognition Arousal/Alertness: Awake/alert Behavior During Therapy: Flat affect Overall Cognitive Status: Impaired/Different from baseline Area of Impairment: Orientation;Attention;Memory;Following commands;Safety/judgement;Awareness                 Orientation Level: Disoriented to;Time;Situation Current Attention Level: Focused Memory: Decreased short-term memory Following Commands: Follows one step commands inconsistently;Follows one step commands with increased time Safety/Judgement: Decreased awareness of safety;Decreased awareness of deficits Awareness: Intellectual Problem Solving: Slow processing;Decreased initiation;Difficulty sequencing;Requires verbal cues;Requires tactile cues General Comments: Talking some today; requires repetition to follow commands/answer  questions. Easily distracted. Fidgeting with lines/pads etc. Easily redirectable.      Exercises  General Comments General comments (skin integrity, edema, etc.): Significant other present in room. Noted to have swelling in left hand due to infiltrated IV. RN aware.      Pertinent Vitals/Pain Pain Assessment: Faces Faces Pain Scale: No hurt    Home Living                      Prior Function            PT Goals (current goals can now be found in the care plan section) Progress towards PT goals: Progressing toward goals    Frequency    Min 3X/week      PT Plan Current plan remains appropriate    Co-evaluation              AM-PAC PT "6 Clicks" Mobility   Outcome Measure  Help needed turning from your back to your side while in a flat bed without using bedrails?: A Little Help needed moving from lying on your back to sitting on the side of a flat bed without using bedrails?: A Lot Help needed moving to and from a bed to a chair (including a wheelchair)?: A Lot Help needed standing up from a chair using your arms (e.g., wheelchair or bedside chair)?: A Lot Help needed to walk in hospital room?: A Little Help needed climbing 3-5 steps with a railing? : A Lot 6 Click Score: 14    End of Session Equipment Utilized During Treatment: Gait belt Activity Tolerance: Patient tolerated treatment well;Patient limited by fatigue Patient left: in chair;with call bell/phone within reach;with chair alarm set;with family/visitor present Nurse Communication: Mobility status PT Visit Diagnosis: Muscle weakness (generalized) (M62.81)     Time: 7628-3151 PT Time Calculation (min) (ACUTE ONLY): 19 min  Charges:  $Gait Training: 8-22 mins                     Vale Haven, PT, DPT Acute Rehabilitation Services Pager [phone] Office [phone]       Blake Divine A Lanier Ensign 12/31/2019, 12:14 PM

## 2020-01-01 ENCOUNTER — Inpatient Hospital Stay (HOSPITAL_COMMUNITY): Payer: Medicare HMO

## 2020-01-01 DIAGNOSIS — R609 Edema, unspecified: Secondary | ICD-10-CM

## 2020-01-01 DIAGNOSIS — I5042 Chronic combined systolic (congestive) and diastolic (congestive) heart failure: Secondary | ICD-10-CM | POA: Diagnosis not present

## 2020-01-01 LAB — BASIC METABOLIC PANEL
Anion gap: 12 (ref 5–15)
BUN: 20 mg/dL (ref 8–23)
CO2: 18 mmol/L — ABNORMAL LOW (ref 22–32)
Calcium: 9.3 mg/dL (ref 8.9–10.3)
Chloride: 112 mmol/L — ABNORMAL HIGH (ref 98–111)
Creatinine, Ser: 1.59 mg/dL — ABNORMAL HIGH (ref 0.44–1.00)
GFR, Estimated: 33 mL/min — ABNORMAL LOW (ref >60)
Glucose, Bld: 107 mg/dL — ABNORMAL HIGH (ref 70–99)
Potassium: 4 mmol/L (ref 3.5–5.1)
Sodium: 142 mmol/L (ref 135–145)

## 2020-01-01 LAB — GLUCOSE, CAPILLARY
Glucose-Capillary: 101 mg/dL — ABNORMAL HIGH (ref 70–99)
Glucose-Capillary: 119 mg/dL — ABNORMAL HIGH (ref 70–99)
Glucose-Capillary: 121 mg/dL — ABNORMAL HIGH (ref 70–99)
Glucose-Capillary: 126 mg/dL — ABNORMAL HIGH (ref 70–99)
Glucose-Capillary: 135 mg/dL — ABNORMAL HIGH (ref 70–99)
Glucose-Capillary: 87 mg/dL (ref 70–99)
Glucose-Capillary: 94 mg/dL (ref 70–99)

## 2020-01-01 NOTE — Progress Notes (Signed)
PROGRESS NOTE    [name]   KVQ:[id number]  DOB: 03-03-1940  DOA: 12/27/2019     4  PCP: [name], MD  CC: seizure   Hospital Course: [name] is a 79 year old female with past medical history significant for seizure disorder, dementia, CKD stage IIIb, and ICM, chronic combined systolic and diastolic congestive heart failure, history of ischemic CVA 2014, gout, essential hypertension who presented to the ED via EMS following breakthrough seizure.  Patient was given IV benzodiazepines via EMS and loaded on IV Keppra in the ED.  Neurology was consulted.  CT head showed no acute intracranial abnormality and CTA head/neck with no large vessel occlusion.   EEG initially showed left posterior quadrant PLEDs. MRI brain was obtained and showed no acute infarction or stroke.  She was seen to have severe chronic small vessel ischemic disease with multiple chronic infarcts and chronic microhemorrhages. Neurology was also consulted on admission.  She was considered to have epilepsy due to previous chronic strokes. She was on Keppra and Vimpat at home which were both increased per neurology.   Interval History:  Doing relatively well this morning.  Endorses no further seizure-like activity.  Moving her upper extremities well.  Does not have use of right lower extremity.  Old records reviewed in assessment of this patient  ROS: Constitutional: negative for chills and fevers, Respiratory: negative for cough, Cardiovascular: negative for chest pain and Gastrointestinal: negative for abdominal pain  Assessment & Plan: Acute metabolic encephalopathy Epilepsy with breakthrough seizure Aphasia Patient presenting from home after being found down with convulsive/generalized shaking seizure activity.  Was given midazolam by EMS and loaded with IV Keppra on arrival to ED.  Convulsions had subsided but continues with confusion which is far from her normal baseline. CT head  with no acute intracranial abnormality but does note moderate/advanced chronic microvascular ischemic disease and remote infarcts.  CT head/neck with no large vessel occlusion. EEG with findings of severe cortical irritability left parieto-occipital region. MR brain w/o contrast with no acute intracranial normality but notes severe chronic small vessel ischemic disease with multiple chronic infarcts and chronic microhemorrhages. --Neurology following, appreciate assistance --Keppra 1000 mg PO BID --Vimpat 200mg  PO BID --Seizure precautions --Continue PT/OT/SLP efforts while inpatient --Now cleared for regular diet with thin liquids --Pending SNF placement  Left upper extremity edema --Discontinue IV fluids; now can tolerate p.o. intake --Check vascular duplex ultrasound left upper extremity, still pending   Elevated troponin Patient was noted to have an elevated troponin of 1220.  Cardiology overnight was called, suspect this is from type II demand ischemia secondary to seizure as above.  EKG with normal sinus rhythm, rate 79, QTc 536, no concerning ST elevation/depressions or T wave inversions. --Troponin 1220>926>398 --Continue to monitor on telemetry  Hypoglycemia: Resolved Glucose 111 this morning, likely secondary to n.p.o. status and lethargy. --DC IV fluids now transition to diet --Continue to closely monitor CBGs --Hypoglycemic protocol  Essential hypertension Home medications include amlodipine 10 mg p.o. daily, carvedilol 12.5 mg twice daily, losartan 100mg  p.o. daily --Amlodipine 10 mg p.o. daily --Carvedilol 12.5 mg p.o. twice daily --At home losartan at reduced dose 50 mg p.o. daily today --Continue monitor blood pressure and creatinine closely, uptitrate losartan to home dose if able  Chronic combined systolic and diastolic congestive heart failure, compensated Nonischemic cardiomyopathy Follows with cardiology outpatient, Dr. .  TTE 10/16/2019 with LVEF  30-35%, LV moderately decreased function with global hypokinesis, trivial MR, moderate TR, mild AR, normal  IVC. TTE 11/27 with LVEF 40-45%, LV demonstrates global hypokinesis, trivial MR, mild AR, LA moderately dilated, IVC normal, no evidence of intra-atrial shunt. --Strict I's and O's and daily weights  CKD stage IIIb Baseline creatinine 1.5.  Creatinine on admission 1.68, at baseline.  On sodium bicarbonate outpatient. -Continue diet --Avoid nephrotoxins, renally dose all medications --Strict I's and O's and daily weights  Alzheimer's dementia On donepezil 5 mg p.o. nightly at home. --Holding oral meds secondary to mental status --Delirium precautions --Get up during the day --Encourage a familiar face to remain present throughout the day --Keep blinds open and lights on during daylight hours --Minimize the use of opioids/benzodiazepines  Antimicrobials: None  DVT prophylaxis: SCD Code Status: Full Family Communication: None present Disposition Plan: Status is: Inpatient  Remains inpatient appropriate because:Unsafe d/c plan, IV treatments appropriate due to intensity of illness or inability to take PO and Inpatient level of care appropriate due to severity of illness   Dispo:  Patient From: Home  Planned Disposition: Skilled Nursing Facility  Expected discharge date: 01/02/20  Medically stable for discharge: Yes        Objective: Blood pressure (!) 147/88, pulse 89, temperature 98.6 F (37 C), temperature source Oral, resp. rate 16, height 5\' 1"  (1.549 m), weight 36.4 kg, SpO2 98 %.  Examination: General appearance: Pleasant elderly woman resting in bed in no distress Head: Normocephalic, without obvious abnormality, atraumatic Eyes: EOMI Lungs: clear to auscultation bilaterally Heart: regular rate and rhythm and S1, S2 normal Abdomen: Soft, nontender, nondistended, bowel sounds present Extremities: No edema Skin: Warm, dry, intact Neurologic: Strength  intact in upper extremities.  Unable to move right lower extremity  Consultants:   Neurology  Procedures:   EEG  Data Reviewed: I have personally reviewed following labs and imaging studies Results for orders placed or performed during the hospital encounter of 12/27/19 (from the past 24 hour(s))  Glucose, capillary     Status: Abnormal   Collection Time: 12/31/19  3:37 PM  Result Value Ref Range   Glucose-Capillary 126 (H) 70 - 99 mg/dL  Glucose, capillary     Status: Abnormal   Collection Time: 12/31/19  8:25 PM  Result Value Ref Range   Glucose-Capillary 126 (H) 70 - 99 mg/dL   Comment 1 Notify RN    Comment 2 Document in Chart   Glucose, capillary     Status: Abnormal   Collection Time: 01/01/20 12:34 AM  Result Value Ref Range   Glucose-Capillary 119 (H) 70 - 99 mg/dL  Basic metabolic panel     Status: Abnormal   Collection Time: 01/01/20  3:07 AM  Result Value Ref Range   Sodium 142 135 - 145 mmol/L   Potassium 4.0 3.5 - 5.1 mmol/L   Chloride 112 (H) 98 - 111 mmol/L   CO2 18 (L) 22 - 32 mmol/L   Glucose, Bld 107 (H) 70 - 99 mg/dL   BUN 20 8 - 23 mg/dL   Creatinine, Ser 14/01/21 (H) 0.44 - 1.00 mg/dL   Calcium 9.3 8.9 - 6.75 mg/dL   GFR, Estimated 33 (L) >60 mL/min   Anion gap 12 5 - 15  Glucose, capillary     Status: Abnormal   Collection Time: 01/01/20  4:29 AM  Result Value Ref Range   Glucose-Capillary 126 (H) 70 - 99 mg/dL   Comment 1 Notify RN    Comment 2 Document in Chart   Glucose, capillary     Status: None   Collection  Time: 01/01/20  7:44 AM  Result Value Ref Range   Glucose-Capillary 87 70 - 99 mg/dL  Glucose, capillary     Status: Abnormal   Collection Time: 01/01/20 11:56 AM  Result Value Ref Range   Glucose-Capillary 121 (H) 70 - 99 mg/dL    Recent Results (from the past 240 hour(s))  Resp Panel by RT-PCR (Flu A&B, Covid) Nasopharyngeal Swab     Status: None   Collection Time: 12/27/19  2:55 PM   Specimen: Nasopharyngeal Swab;  Nasopharyngeal(NP) swabs in vial transport medium  Result Value Ref Range Status   SARS Coronavirus 2 by RT PCR NEGATIVE NEGATIVE Final    Comment: (NOTE) SARS-CoV-2 target nucleic acids are NOT DETECTED.  The SARS-CoV-2 RNA is generally detectable in upper respiratory specimens during the acute phase of infection. The lowest concentration of SARS-CoV-2 viral copies this assay can detect is 138 copies/mL. A negative result does not preclude SARS-Cov-2 infection and should not be used as the sole basis for treatment or other patient management decisions. A negative result may occur with  improper specimen collection/handling, submission of specimen other than nasopharyngeal swab, presence of viral mutation(s) within the areas targeted by this assay, and inadequate number of viral copies(<138 copies/mL). A negative result must be combined with clinical observations, patient history, and epidemiological information. The expected result is Negative.  Fact Sheet for Patients:  BloggerCourse.com  Fact Sheet for Healthcare Providers:  SeriousBroker.it  This test is no t yet approved or cleared by the [place] FDA and  has been authorized for detection and/or diagnosis of SARS-CoV-2 by FDA under an Emergency Use Authorization (EUA). This EUA will remain  in effect (meaning this test can be used) for the duration of the COVID-19 declaration under Section 564(b)(1) of the Act, 21 U.S.C.section 360bbb-3(b)(1), unless the authorization is terminated  or revoked sooner.       Influenza A by PCR NEGATIVE NEGATIVE Final   Influenza B by PCR NEGATIVE NEGATIVE Final    Comment: (NOTE) The Xpert Xpress SARS-CoV-2/FLU/RSV plus assay is intended as an aid in the diagnosis of influenza from Nasopharyngeal swab specimens and should not be used as a sole basis for treatment. Nasal washings and aspirates are unacceptable for Xpert Xpress  SARS-CoV-2/FLU/RSV testing.  Fact Sheet for Patients: BloggerCourse.com  Fact Sheet for Healthcare Providers: SeriousBroker.it  This test is not yet approved or cleared by the [place] FDA and has been authorized for detection and/or diagnosis of SARS-CoV-2 by FDA under an Emergency Use Authorization (EUA). This EUA will remain in effect (meaning this test can be used) for the duration of the COVID-19 declaration under Section 564(b)(1) of the Act, 21 U.S.C. section 360bbb-3(b)(1), unless the authorization is terminated or revoked.  Performed at [facility], [address], [place], [place] [address]      Radiology Studies: No results found. MR BRAIN WO CONTRAST  Final Result    CT ANGIO HEAD W OR WO CONTRAST  Final Result    CT ANGIO NECK W OR WO CONTRAST  Final Result    CT Head Wo Contrast  Final Result    DG Chest Port 1 View  Final Result    VAS [place] UPPER EXTREMITY VENOUS DUPLEX    (Results Pending)    Scheduled Meds: . amLODipine  10 mg Oral Daily  . aspirin  300 mg Rectal Daily   Or  . aspirin  325 mg Oral Daily  . carvedilol  12.5 mg Oral  BID WC  . lacosamide  200 mg Oral BID  . levETIRAcetam  1,000 mg Oral BID  . losartan  50 mg Oral q morning - 10a   PRN Meds: acetaminophen **OR** acetaminophen (TYLENOL) oral liquid 160 mg/5 mL **OR** acetaminophen, hydrALAZINE, LORazepam Continuous Infusions:   LOS: 4 days  Time spent: Greater than 50% of the 35 minute visit was spent in counseling/coordination of care for the patient as laid out in the A&P.   Lewie Chamber, MD [facility] 01/01/2020, 3:02 PM

## 2020-01-01 NOTE — Progress Notes (Signed)
Physical Therapy Treatment Patient Details Name: [name] MRN: [id number] DOB: Oct 31, 1940 Today's Date: 01/01/2020    History of Present Illness 79 year old female with past medical history of systolic and diastolic congestive heart failure; nonischemic cardiomyopathy, seizure disorder, chronic kidney disease stage IIIb, previous stroke (2014), gout, hypertension, and Alzheimer's dementia who presents to [facility] emergency department via EMS after being found with suspected seizure activity by her significant other. MRI  - no acute intracranial abnormality; severe chronic small vessel ischemic disease with multiple chronic infarcts and microhemorrhages.     PT Comments    Pt tolerates treatment well, although is unable to consistently follow commands. Pt is easily distracted and requires frequent cueing to maintain attention. Pt with impaired device management during mobility and significant weakness which results in a high falls risk for all mobility tasks. Pt with dementia at baseline, resulting in difficulties retaining PT cues during session. Pt will benefit from continued acute PT POC to improve mobility quality and to reduce falls risk. PT continues to recommend SNF placement.   Follow Up Recommendations  SNF;Supervision/Assistance - 24 hour     Equipment Recommendations  Rolling walker with 5" wheels;Wheelchair (measurements PT)    Recommendations for Other Services       Precautions / Restrictions Precautions Precautions: Fall Precaution Comments: seizure precautions Restrictions Weight Bearing Restrictions: No    Mobility  Bed Mobility Overal bed mobility: Needs Assistance Bed Mobility: Supine to Sit;Sit to Supine     Supine to sit: Mod assist;HOB elevated Sit to supine: Max assist      Transfers Overall transfer level: Needs assistance Equipment used: Rolling walker (2 wheeled);1 person hand held assist Transfers: Sit to/from [employer] Sit to Stand: Mod assist Stand pivot transfers: Max assist       General transfer comment: pt requires cues for hand placement, physical assistance to promote a forward lean to scoot to edge of chair  Ambulation/Gait Ambulation/Gait assistance: Mod assist;Max assist Gait Distance (Feet): 10 Feet (additional 8' with hand hold) Assistive device: Rolling walker (2 wheeled);1 person hand held assist Gait Pattern/deviations: Step-to pattern;Decreased step length - right;Decreased step length - left;Scissoring Gait velocity: reduced Gait velocity interpretation: <1.31 ft/sec, indicative of household ambulator General Gait Details: pt with impaired management of RW, poor grip on left side with difficulty maintaini9ng hand placement and turning device. Pt with inconsistent step placement, short step-to gait with intermittent scissoring. Pt with improved step sequencing without RW, however weakess leads to more frequent losses of balance resulting in maxA requirements to prevent fall   Stairs             Wheelchair Mobility    Modified Rankin (Stroke Patients Only)       Balance Overall balance assessment: Needs assistance Sitting-balance support: Single extremity supported;No upper extremity supported;Feet supported Sitting balance-Leahy Scale: Poor Sitting balance - Comments: modA without UE support Postural control: Posterior lean;Left lateral lean Standing balance support: Single extremity supported;Bilateral upper extremity supported Standing balance-Leahy Scale: Poor Standing balance comment: min-modA with BUE support of RW                            Cognition Arousal/Alertness: Awake/alert Behavior During Therapy: WFL for tasks assessed/performed Overall Cognitive Status: History of cognitive impairments - at baseline Area of Impairment: Attention;Memory;Following commands;Safety/judgement;Awareness;Problem solving                    Current Attention  Level: Focused Memory: Decreased recall of precautions;Decreased short-term memory Following Commands: Follows one step commands inconsistently Safety/Judgement: Decreased awareness of safety;Decreased awareness of deficits Awareness: Intellectual Problem Solving: Slow processing;Difficulty sequencing;Decreased initiation        Exercises      General Comments General comments (skin integrity, edema, etc.): intermittently tachy into 130s with activity, otherwise VSS      Pertinent Vitals/Pain Pain Assessment: Faces Faces Pain Scale: No hurt    Home Living                      Prior Function            PT Goals (current goals can now be found in the care plan section) Acute Rehab PT Goals Patient Stated Goal: to reduce falls risk and return toward Progress towards PT goals: Not progressing toward goals - comment (poor command following)    Frequency    Min 2X/week      PT Plan Current plan remains appropriate    Co-evaluation              AM-PAC PT "6 Clicks" Mobility   Outcome Measure  Help needed turning from your back to your side while in a flat bed without using bedrails?: A Little Help needed moving from lying on your back to sitting on the side of a flat bed without using bedrails?: A Lot Help needed moving to and from a bed to a chair (including a wheelchair)?: A Lot Help needed standing up from a chair using your arms (e.g., wheelchair or bedside chair)?: A Lot Help needed to walk in hospital room?: A Lot Help needed climbing 3-5 steps with a railing? : Total 6 Click Score: 12    End of Session Equipment Utilized During Treatment: Gait belt Activity Tolerance: Patient tolerated treatment well Patient left: in bed;with call bell/phone within reach;with bed alarm set;with family/visitor present Nurse Communication: Mobility status PT Visit Diagnosis: Muscle weakness (generalized) (M62.81)     Time: 5009-3818 PT  Time Calculation (min) (ACUTE ONLY): 34 min  Charges:  $Gait Training: 8-22 mins $Therapeutic Activity: 8-22 mins                     Arlyss Gandy, PT, DPT Acute Rehabilitation Pager: [phone]    Arlyss Gandy 01/01/2020, 2:31 PM

## 2020-01-01 NOTE — TOC Progression Note (Signed)
Transition of Care [facility]) - Progression Note    Patient Details  Name: [name] MRN: [id number] Date of Birth: [birth year]  [facility]) CM/SW Contact  Mearl Latin, LCSW Phone Number: 01/01/2020, 2:53 PM  Clinical Narrative:    [facility] still has not received insurance approval. Will request COVID test once they have received it.   Expected Discharge Plan: Skilled Nursing Facility Barriers to Discharge: Continued Medical Work up, SNF Pending bed offer, English as a second language teacher  Expected Discharge Plan and Services Expected Discharge Plan: Skilled Nursing Facility In-house Referral: Clinical Social Work   Post Acute Care Choice: Skilled Nursing Facility Living arrangements for the past 2 months: Single Family Home                                       Social Determinants of Health (SDOH) Interventions    Readmission Risk Interventions No flowsheet data found.

## 2020-01-01 NOTE — Plan of Care (Signed)
  Problem: Clinical Measurements: Goal: Ability to maintain clinical measurements within normal limits will improve Outcome: Progressing   Problem: Clinical Measurements: Goal: Ability to maintain clinical measurements within normal limits will improve Outcome: Progressing   Problem: Health Behavior/Discharge Planning: Goal: Ability to manage health-related needs will improve Outcome: Progressing   

## 2020-01-01 NOTE — Progress Notes (Signed)
Left upper extremity venous duplex has been completed. Preliminary results can be found in CV Proc through chart review.   01/01/20 4:29 PM Olen Cordial RVT

## 2020-01-01 NOTE — Hospital Course (Signed)
[name] is a 79 year old female with past medical history significant for seizure disorder, dementia, CKD stage IIIb, and ICM, chronic combined systolic and diastolic congestive heart failure, history of ischemic CVA 2014, gout, essential hypertension who presented to the ED via EMS following breakthrough seizure.  Patient was given IV benzodiazepines via EMS and loaded on IV Keppra in the ED.  Neurology was consulted.  CT head showed no acute intracranial abnormality and CTA head/neck with no large vessel occlusion.   EEG initially showed left posterior quadrant PLEDs. MRI brain was obtained and showed no acute infarction or stroke.  She was seen to have severe chronic small vessel ischemic disease with multiple chronic infarcts and chronic microhemorrhages. Neurology was also consulted on admission.  She was considered to have epilepsy due to previous chronic strokes. She was on Keppra and Vimpat at home which were both increased per neurology.

## 2020-01-02 DIAGNOSIS — I5042 Chronic combined systolic (congestive) and diastolic (congestive) heart failure: Secondary | ICD-10-CM | POA: Diagnosis not present

## 2020-01-02 DIAGNOSIS — F0280 Dementia in other diseases classified elsewhere without behavioral disturbance: Secondary | ICD-10-CM | POA: Diagnosis not present

## 2020-01-02 DIAGNOSIS — R1312 Dysphagia, oropharyngeal phase: Secondary | ICD-10-CM | POA: Diagnosis not present

## 2020-01-02 DIAGNOSIS — R4701 Aphasia: Secondary | ICD-10-CM | POA: Diagnosis not present

## 2020-01-02 DIAGNOSIS — W1830XA Fall on same level, unspecified, initial encounter: Secondary | ICD-10-CM | POA: Diagnosis present

## 2020-01-02 DIAGNOSIS — M25551 Pain in right hip: Secondary | ICD-10-CM | POA: Diagnosis not present

## 2020-01-02 DIAGNOSIS — M79605 Pain in left leg: Secondary | ICD-10-CM | POA: Diagnosis not present

## 2020-01-02 DIAGNOSIS — S42211A Unspecified displaced fracture of surgical neck of right humerus, initial encounter for closed fracture: Secondary | ICD-10-CM | POA: Diagnosis not present

## 2020-01-02 DIAGNOSIS — R9431 Abnormal electrocardiogram [ECG] [EKG]: Secondary | ICD-10-CM | POA: Diagnosis not present

## 2020-01-02 DIAGNOSIS — I517 Cardiomegaly: Secondary | ICD-10-CM | POA: Diagnosis not present

## 2020-01-02 DIAGNOSIS — M2550 Pain in unspecified joint: Secondary | ICD-10-CM | POA: Diagnosis not present

## 2020-01-02 DIAGNOSIS — G40919 Epilepsy, unspecified, intractable, without status epilepticus: Secondary | ICD-10-CM | POA: Diagnosis not present

## 2020-01-02 DIAGNOSIS — R079 Chest pain, unspecified: Secondary | ICD-10-CM | POA: Diagnosis not present

## 2020-01-02 DIAGNOSIS — D62 Acute posthemorrhagic anemia: Secondary | ICD-10-CM | POA: Diagnosis not present

## 2020-01-02 DIAGNOSIS — W19XXXA Unspecified fall, initial encounter: Secondary | ICD-10-CM | POA: Diagnosis not present

## 2020-01-02 DIAGNOSIS — Z20822 Contact with and (suspected) exposure to covid-19: Secondary | ICD-10-CM | POA: Diagnosis not present

## 2020-01-02 DIAGNOSIS — D649 Anemia, unspecified: Secondary | ICD-10-CM | POA: Diagnosis not present

## 2020-01-02 DIAGNOSIS — S42291A Other displaced fracture of upper end of right humerus, initial encounter for closed fracture: Secondary | ICD-10-CM | POA: Diagnosis not present

## 2020-01-02 DIAGNOSIS — N189 Chronic kidney disease, unspecified: Secondary | ICD-10-CM | POA: Diagnosis not present

## 2020-01-02 DIAGNOSIS — I69391 Dysphagia following cerebral infarction: Secondary | ICD-10-CM | POA: Diagnosis not present

## 2020-01-02 DIAGNOSIS — M25511 Pain in right shoulder: Secondary | ICD-10-CM | POA: Diagnosis not present

## 2020-01-02 DIAGNOSIS — F05 Delirium due to known physiological condition: Secondary | ICD-10-CM | POA: Diagnosis not present

## 2020-01-02 DIAGNOSIS — M109 Gout, unspecified: Secondary | ICD-10-CM | POA: Diagnosis present

## 2020-01-02 DIAGNOSIS — Z8673 Personal history of transient ischemic attack (TIA), and cerebral infarction without residual deficits: Secondary | ICD-10-CM | POA: Diagnosis not present

## 2020-01-02 DIAGNOSIS — N289 Disorder of kidney and ureter, unspecified: Secondary | ICD-10-CM | POA: Diagnosis not present

## 2020-01-02 DIAGNOSIS — I10 Essential (primary) hypertension: Secondary | ICD-10-CM | POA: Diagnosis not present

## 2020-01-02 DIAGNOSIS — Z7982 Long term (current) use of aspirin: Secondary | ICD-10-CM | POA: Diagnosis not present

## 2020-01-02 DIAGNOSIS — S42201A Unspecified fracture of upper end of right humerus, initial encounter for closed fracture: Secondary | ICD-10-CM | POA: Diagnosis not present

## 2020-01-02 DIAGNOSIS — M79601 Pain in right arm: Secondary | ICD-10-CM | POA: Diagnosis not present

## 2020-01-02 DIAGNOSIS — Z043 Encounter for examination and observation following other accident: Secondary | ICD-10-CM | POA: Diagnosis not present

## 2020-01-02 DIAGNOSIS — G40909 Epilepsy, unspecified, not intractable, without status epilepticus: Secondary | ICD-10-CM | POA: Diagnosis present

## 2020-01-02 DIAGNOSIS — N1832 Chronic kidney disease, stage 3b: Secondary | ICD-10-CM | POA: Diagnosis present

## 2020-01-02 DIAGNOSIS — E43 Unspecified severe protein-calorie malnutrition: Secondary | ICD-10-CM | POA: Diagnosis not present

## 2020-01-02 DIAGNOSIS — F17210 Nicotine dependence, cigarettes, uncomplicated: Secondary | ICD-10-CM | POA: Diagnosis present

## 2020-01-02 DIAGNOSIS — S72144A Nondisplaced intertrochanteric fracture of right femur, initial encounter for closed fracture: Secondary | ICD-10-CM | POA: Diagnosis not present

## 2020-01-02 DIAGNOSIS — S42351A Displaced comminuted fracture of shaft of humerus, right arm, initial encounter for closed fracture: Secondary | ICD-10-CM | POA: Diagnosis not present

## 2020-01-02 DIAGNOSIS — R252 Cramp and spasm: Secondary | ICD-10-CM | POA: Diagnosis not present

## 2020-01-02 DIAGNOSIS — S72101A Unspecified trochanteric fracture of right femur, initial encounter for closed fracture: Secondary | ICD-10-CM | POA: Diagnosis not present

## 2020-01-02 DIAGNOSIS — I130 Hypertensive heart and chronic kidney disease with heart failure and stage 1 through stage 4 chronic kidney disease, or unspecified chronic kidney disease: Secondary | ICD-10-CM | POA: Diagnosis not present

## 2020-01-02 DIAGNOSIS — Z7401 Bed confinement status: Secondary | ICD-10-CM | POA: Diagnosis not present

## 2020-01-02 DIAGNOSIS — I69351 Hemiplegia and hemiparesis following cerebral infarction affecting right dominant side: Secondary | ICD-10-CM | POA: Diagnosis not present

## 2020-01-02 DIAGNOSIS — I69393 Ataxia following cerebral infarction: Secondary | ICD-10-CM | POA: Diagnosis not present

## 2020-01-02 DIAGNOSIS — R569 Unspecified convulsions: Secondary | ICD-10-CM | POA: Diagnosis not present

## 2020-01-02 DIAGNOSIS — Z79899 Other long term (current) drug therapy: Secondary | ICD-10-CM | POA: Diagnosis not present

## 2020-01-02 DIAGNOSIS — Z681 Body mass index (BMI) 19 or less, adult: Secondary | ICD-10-CM | POA: Diagnosis not present

## 2020-01-02 DIAGNOSIS — M7989 Other specified soft tissue disorders: Secondary | ICD-10-CM | POA: Diagnosis not present

## 2020-01-02 DIAGNOSIS — M6281 Muscle weakness (generalized): Secondary | ICD-10-CM | POA: Diagnosis not present

## 2020-01-02 DIAGNOSIS — I428 Other cardiomyopathies: Secondary | ICD-10-CM | POA: Diagnosis not present

## 2020-01-02 DIAGNOSIS — R52 Pain, unspecified: Secondary | ICD-10-CM | POA: Diagnosis not present

## 2020-01-02 DIAGNOSIS — M1A9XX0 Chronic gout, unspecified, without tophus (tophi): Secondary | ICD-10-CM | POA: Diagnosis not present

## 2020-01-02 DIAGNOSIS — Z20818 Contact with and (suspected) exposure to other bacterial communicable diseases: Secondary | ICD-10-CM | POA: Diagnosis not present

## 2020-01-02 DIAGNOSIS — S72141A Displaced intertrochanteric fracture of right femur, initial encounter for closed fracture: Secondary | ICD-10-CM | POA: Diagnosis not present

## 2020-01-02 DIAGNOSIS — E875 Hyperkalemia: Secondary | ICD-10-CM | POA: Diagnosis not present

## 2020-01-02 DIAGNOSIS — J069 Acute upper respiratory infection, unspecified: Secondary | ICD-10-CM | POA: Diagnosis not present

## 2020-01-02 DIAGNOSIS — R600 Localized edema: Secondary | ICD-10-CM | POA: Diagnosis not present

## 2020-01-02 DIAGNOSIS — G9341 Metabolic encephalopathy: Secondary | ICD-10-CM | POA: Diagnosis not present

## 2020-01-02 DIAGNOSIS — G309 Alzheimer's disease, unspecified: Secondary | ICD-10-CM | POA: Diagnosis not present

## 2020-01-02 DIAGNOSIS — N179 Acute kidney failure, unspecified: Secondary | ICD-10-CM | POA: Diagnosis not present

## 2020-01-02 DIAGNOSIS — Y9301 Activity, walking, marching and hiking: Secondary | ICD-10-CM | POA: Diagnosis present

## 2020-01-02 DIAGNOSIS — R410 Disorientation, unspecified: Secondary | ICD-10-CM | POA: Diagnosis not present

## 2020-01-02 DIAGNOSIS — I2510 Atherosclerotic heart disease of native coronary artery without angina pectoris: Secondary | ICD-10-CM | POA: Diagnosis present

## 2020-01-02 LAB — GLUCOSE, CAPILLARY
Glucose-Capillary: 97 mg/dL (ref 70–99)
Glucose-Capillary: 103 mg/dL — ABNORMAL HIGH (ref 70–99)
Glucose-Capillary: 121 mg/dL — ABNORMAL HIGH (ref 70–99)
Glucose-Capillary: 99 mg/dL (ref 70–99)

## 2020-01-02 LAB — BASIC METABOLIC PANEL
Anion gap: 14 (ref 5–15)
BUN: 19 mg/dL (ref 8–23)
CO2: 20 mmol/L — ABNORMAL LOW (ref 22–32)
Calcium: 9.6 mg/dL (ref 8.9–10.3)
Chloride: 107 mmol/L (ref 98–111)
Creatinine, Ser: 1.38 mg/dL — ABNORMAL HIGH (ref 0.44–1.00)
GFR, Estimated: 39 mL/min — ABNORMAL LOW (ref >60)
Glucose, Bld: 94 mg/dL (ref 70–99)
Potassium: 4.2 mmol/L (ref 3.5–5.1)
Sodium: 141 mmol/L (ref 135–145)

## 2020-01-02 LAB — SARS CORONAVIRUS 2 BY RT PCR (HOSPITAL ORDER, PERFORMED IN ~~LOC~~ HOSPITAL LAB): SARS Coronavirus 2: NEGATIVE

## 2020-01-02 MED ORDER — LOSARTAN POTASSIUM 50 MG PO TABS
50.0000 mg | ORAL_TABLET | Freq: Every morning | ORAL | Status: DC
Start: 2020-01-03 — End: 2020-01-10

## 2020-01-02 MED ORDER — LEVETIRACETAM 1000 MG PO TABS
1000.0000 mg | ORAL_TABLET | Freq: Two times a day (BID) | ORAL | Status: DC
Start: 2020-01-02 — End: 2020-03-04

## 2020-01-02 MED ORDER — LACOSAMIDE 200 MG PO TABS
200.0000 mg | ORAL_TABLET | Freq: Two times a day (BID) | ORAL | Status: DC
Start: 2020-01-02 — End: 2020-02-13

## 2020-01-02 MED ORDER — ASPIRIN 325 MG PO TABS
325.0000 mg | ORAL_TABLET | Freq: Every day | ORAL | Status: DC
Start: 2020-01-03 — End: 2020-01-10

## 2020-01-02 NOTE — TOC Transition Note (Addendum)
Transition of Care [facility]) - CM/SW Discharge Note   Patient Details  Name: [name] MRN: [id number] Date of Birth: 1940-10-22  Transition of Care [facility] CM/SW Contact:  Taylor Frederick, LCSW Phone Number: 01/02/2020, 1:22 PM   Clinical Narrative:   Pt discharging to [facility], room 124.  RN call report to [phone].  CSW informed pt daughter [name] is upset, CSW LM with daughter, CSW then spoke with son [name] who worked with [facility] to identify the current SNF choice, he said to "not worry about my sister".  CSW did receive return call from [facility] about changing SNF choice and informed her that we have to move forward with [facility] at this point.  She was polite and said she may speak with the administrator at [facility] about a change.     Final next level of care: Skilled Nursing Facility Barriers to Discharge: Barriers Resolved   Patient Goals and CMS Choice Patient states their goals for this hospitalization and ongoing recovery are:: Rehab CMS Medicare.gov Compare Post Acute Care list provided to:: Patient Represenative (must comment) Choice offered to / list presented to : Adult Children  Discharge Placement              Patient chooses bed at: [facility] Patient to be transferred to facility by: [facility] Name of family member notified: son, [name] Patient and family notified of of transfer: 01/02/20  Discharge Plan and Services In-house Referral: Clinical Social Work   Post Acute Care Choice: Skilled Nursing Facility                               Social Determinants of Health (SDOH) Interventions     Readmission Risk Interventions No flowsheet data found.

## 2020-01-02 NOTE — Progress Notes (Signed)
Physical Therapy Treatment Patient Details Name: [name] MRN: [id number] DOB: [birth year] Today's Date: 01/02/2020    History of Present Illness 79 year old female with past medical history of systolic and diastolic congestive heart failure; nonischemic cardiomyopathy, seizure disorder, chronic kidney disease stage IIIb, previous stroke (2014), gout, hypertension, and Alzheimer's dementia who presents to [facility] emergency department via EMS after being found with suspected seizure activity by her significant other. MRI  - no acute intracranial abnormality; severe chronic small vessel ischemic disease with multiple chronic infarcts and microhemorrhages.     PT Comments    Pt tolerates treatment well but has difficulty maintaining attention throughout session. Pt often perseverating over motor tasks and has difficulty following cues to move onto next task. Pt demonstrates a R sided lean and scissoring with gait training which place her at a high risk for falls at this time. Pt will benefit from continued acute PT POC to improve gait and balance quality and to reduce caregiver burden. PT continues to recommend SNF placement at this time.  Follow Up Recommendations  SNF;Supervision/Assistance - 24 hour     Equipment Recommendations  Rolling walker with 5" wheels;Wheelchair (measurements PT)    Recommendations for Other Services       Precautions / Restrictions Precautions Precautions: Fall Precaution Comments: seizure precautions Restrictions Weight Bearing Restrictions: No    Mobility  Bed Mobility Overal bed mobility: Needs Assistance Bed Mobility: Sit to Supine     Supine to sit: Mod assist Sit to supine: Min assist   General bed mobility comments: management of LEs to re-orient in bed  Transfers Overall transfer level: Needs assistance Equipment used: Rolling walker (2 wheeled) Transfers: Sit to/from [employer] Sit to Stand: Min  guard Stand pivot transfers: Min assist       General transfer comment: minA to direct RW and facilitate weight shift when turning  Ambulation/Gait Ambulation/Gait assistance: Mod assist Gait Distance (Feet): 30 Feet Assistive device: Rolling walker (2 wheeled) Gait Pattern/deviations: Step-to pattern Gait velocity: reduced Gait velocity interpretation: <1.31 ft/sec, indicative of household ambulator General Gait Details: pt with short step-to gait, PT facilitating weight shift to both sides to initiate mobility. Pt tends to freeze with distraction. Pt able to intermittently follow commands to widen BOS   Stairs             Wheelchair Mobility    Modified Rankin (Stroke Patients Only)       Balance Overall balance assessment: Needs assistance Sitting-balance support: Single extremity supported;Feet supported Sitting balance-Leahy Scale: Poor Sitting balance - Comments: minG-minA   Standing balance support: Bilateral upper extremity supported Standing balance-Leahy Scale: Poor Standing balance comment: min-modA with UE support of RW, R lateral lean                            Cognition Arousal/Alertness: Awake/alert Behavior During Therapy: WFL for tasks assessed/performed Overall Cognitive Status: History of cognitive impairments - at baseline Area of Impairment: Attention;Memory;Following commands;Safety/judgement;Awareness;Problem solving                 Orientation Level: Disoriented to;Time;Situation Current Attention Level: Focused Memory: Decreased short-term memory;Decreased recall of precautions Following Commands: Follows one step commands inconsistently;Follows one step commands with increased time Safety/Judgement: Decreased awareness of safety;Decreased awareness of deficits Awareness: Intellectual Problem Solving: Slow processing;Requires verbal cues;Requires tactile cues;Difficulty sequencing General Comments: history of dementia,  great difficulty following commands, easily distracted  Exercises      General Comments General comments (skin integrity, edema, etc.): VSS on RA      Pertinent Vitals/Pain Pain Assessment: Faces Faces Pain Scale: No hurt Pain Intervention(s): Monitored during session    Home Living                      Prior Function            PT Goals (current goals can now be found in the care plan section) Acute Rehab PT Goals Patient Stated Goal: to reduce falls risk and return toward baseline Progress towards PT goals: Progressing toward goals    Frequency    Min 2X/week      PT Plan Current plan remains appropriate    Co-evaluation PT/OT/SLP Co-Evaluation/Treatment: Yes Reason for Co-Treatment: For patient/therapist safety;To address functional/ADL transfers;Necessary to address cognition/behavior during functional activity          AM-PAC PT "6 Clicks" Mobility   Outcome Measure  Help needed turning from your back to your side while in a flat bed without using bedrails?: A Little Help needed moving from lying on your back to sitting on the side of a flat bed without using bedrails?: A Lot Help needed moving to and from a bed to a chair (including a wheelchair)?: A Lot Help needed standing up from a chair using your arms (e.g., wheelchair or bedside chair)?: A Lot Help needed to walk in hospital room?: A Lot Help needed climbing 3-5 steps with a railing? : Total 6 Click Score: 12    End of Session Equipment Utilized During Treatment: Gait belt Activity Tolerance: Patient tolerated treatment well Patient left: in bed;with call bell/phone within reach;with bed alarm set Nurse Communication: Mobility status PT Visit Diagnosis: Muscle weakness (generalized) (M62.81)     Time: 2025-4270 PT Time Calculation (min) (ACUTE ONLY): 23 min  Charges:  $Gait Training: 8-22 mins                     Arlyss Gandy, PT, DPT Acute Rehabilitation Pager:  [phone]    Arlyss Gandy 01/02/2020, 3:04 PM

## 2020-01-02 NOTE — Discharge Summary (Signed)
Physician Discharge Summary   [name] HER:[id number] DOB: 14-May-1940 DOA: 12/27/2019  PCP: [name], MD  Admit date: 12/27/2019 Discharge date: 01/02/2020  Admitted From: home Disposition:  SNF Discharging physician: Lewie Chamber, MD  Recommendations for Outpatient Follow-up:  1. Follow up with neurology in 8-10 weeks 2. Neuro considering addition of valproic acid if any further seizures; contact neuro if this occurs   Patient discharged to SNF in Discharge Condition: stable CODE STATUS: Full Diet recommendation:  Diet Orders (From admission, onward)    Start     Ordered   12/30/19 1143  DIET DYS 3 Room service appropriate? Yes; Fluid consistency: Thin  Diet effective now       Question Answer Comment  Room service appropriate? Yes   Fluid consistency: Thin      12/30/19 1142          Hospital Course: Inasia Arnold is a 79 year old female with past medical history significant for seizure disorder, dementia, CKD stage IIIb, and ICM, chronic combined systolic and diastolic congestive heart failure, history of ischemic CVA 2014, gout, essential hypertension who presented to the ED via EMS following breakthrough seizure.  Patient was given IV benzodiazepines via EMS and loaded on IV Keppra in the ED.  Neurology was consulted.  CT head showed no acute intracranial abnormality and CTA head/neck with no large vessel occlusion.   EEG initially showed left posterior quadrant PLEDs. MRI brain was obtained and showed no acute infarction or stroke.  She was seen to have severe chronic small vessel ischemic disease with multiple chronic infarcts and chronic microhemorrhages. Neurology was also consulted on admission.  She was considered to have epilepsy due to previous chronic strokes. She was on Keppra and Vimpat at home which were both increased per neurology.  Acute metabolic encephalopathy Epilepsy with breakthrough seizure Aphasia Patient presenting  from home after being found down with convulsive/generalized shaking seizure activity. Was given midazolam by EMS and loaded with IV Keppra on arrival to ED. Convulsions had subsided but continues with confusion which is far from her normal baseline. CT head with no acute intracranial abnormality but does note moderate/advanced chronic microvascular ischemic disease and remote infarcts. CT head/neck with no large vessel occlusion. EEG with findings of severe cortical irritability left parieto-occipital region. MR brain w/o contrast with no acute intracranial normality but notes severe chronic small vessel ischemic disease with multiple chronic infarcts and chronic microhemorrhages. --Keppra 1000 mgPOBID --Vimpat 200mg  POBID --Seizure precautions - follow up with neuro in 8-10 weeks - if any further seizures, neuro considering valproic   Left upper extremity edema --duplex negative for DVT   Elevated troponin Patient was noted to have an elevated troponin of 1220. Cardiology overnight was called, suspect this is from type II demand ischemia secondary to seizure as above. EKG with normal sinus rhythm, rate 79, QTc 536, no concerning ST elevation/depressions or T wave inversions. --Troponin 1220>926>398 --remained CP free  Hypoglycemia: Resolved - glucose stabilized   Essential hypertension Home medications include amlodipine 10 mg p.o. daily, carvedilol 12.5 mg twice daily, losartan 100mg  p.o. daily --Amlodipine 10 mg p.o. daily --Carvedilol 12.5 mg p.o. twice daily --losartan at reduced dose 50 mg p.o. daily today  Chronic combined systolic and diastolic congestive heart failure, compensated Nonischemic cardiomyopathy Follows with cardiology outpatient, Dr. Eldridge Dace. TTE 10/16/2019 with LVEF 30-35%, LV moderately decreased function with global hypokinesis, trivial MR, moderate TR, mild AR, normal IVC. TTE 11/27 with LVEF 40-45%, LV demonstrates global hypokinesis, trivial MR,  mild AR, LA moderately dilated, IVC normal, no evidence of intra-atrial shunt.  CKD stage IIIb Baseline creatinine 1.5. Creatinine on admission 1.68, at baseline. - renal function stable   Alzheimer's dementia On donepezil 5 mg p.o. nightly at home.   Principal Diagnosis: Witnessed seizure-like activity [facility])  Discharge Diagnoses: Active Hospital Problems   Diagnosis Date Noted  . Witnessed seizure-like activity (HCC) 12/27/2019  . Chronic kidney disease, stage 3b (HCC) 12/27/2019  . Alzheimer's dementia without behavioral disturbance (HCC) 12/27/2019  . Gout 12/27/2019  . Leukocytosis 12/27/2019  . Chronic combined systolic and diastolic congestive heart failure (HCC) 03/06/2014  . Hyperkalemia 02/11/2013  . Seizure (HCC) 12/28/2012    Resolved Hospital Problems  No resolved problems to display.    Discharge Instructions    Increase activity slowly   Complete by: As directed      Allergies as of 01/02/2020   No Known Allergies     Medication List    STOP taking these medications   aspirin 81 MG EC tablet Replaced by: aspirin 325 MG tablet   docusate sodium 100 MG capsule Commonly known as: COLACE   furosemide 20 MG tablet Commonly known as: LASIX   sodium bicarbonate 650 MG tablet     TAKE these medications   allopurinol 100 MG tablet Commonly known as: ZYLOPRIM Take 50 mg by mouth daily.   amLODipine 10 MG tablet Commonly known as: NORVASC Take 10 mg by mouth daily.   aspirin 325 MG tablet Take 1 tablet (325 mg total) by mouth daily. Start taking on: January 03, 2020 Replaces: aspirin 81 MG EC tablet   calcium carbonate 1500 (600 Ca) MG Tabs tablet Commonly known as: OSCAL Take 1,500 mg by mouth every evening.   carvedilol 12.5 MG tablet Commonly known as: COREG Take 12.5 mg by mouth 2 (two) times daily with a meal.   donepezil 5 MG tablet Commonly known as: ARICEPT Take 5 mg by mouth at bedtime.   FeroSul 325 (65 FE) MG  tablet Generic drug: ferrous sulfate Take 325 mg by mouth every morning.   lacosamide 200 MG Tabs tablet Commonly known as: VIMPAT Take 1 tablet (200 mg total) by mouth 2 (two) times daily. What changed:   medication strength  See the new instructions.   levETIRAcetam 1000 MG tablet Commonly known as: KEPPRA Take 1 tablet (1,000 mg total) by mouth 2 (two) times daily. What changed:   medication strength  how much to take  how to take this  when to take this  additional instructions   losartan 50 MG tablet Commonly known as: COZAAR Take 1 tablet (50 mg total) by mouth every morning. Start taking on: January 03, 2020 What changed:   medication strength  how much to take       Contact information for after-discharge care    Destination    [facility] Preferred SNF .   Service: Skilled Nursing Contact information: [address] [place] [address] [phone]                 No Known Allergies  Consultations: Neuro  Discharge Exam: BP (!) 147/92 (BP Location: Left Arm)   Pulse 81   Temp 98.1 F (36.7 C) (Axillary)   Resp 16   Ht 5\' 1"  (1.549 m)   Wt 36.4 kg   SpO2 100%   BMI 15.16 kg/m  General appearance: Pleasant elderly woman resting in bed in no distress Head: Normocephalic, without obvious  abnormality, atraumatic Eyes: EOMI Lungs: clear to auscultation bilaterally Heart: regular rate and rhythm and S1, S2 normal Abdomen: Soft, nontender, nondistended, bowel sounds present Extremities: No edema Skin: Warm, dry, intact Neurologic: Strength intact in upper extremities.  Unable to move right lower extremity  The results of significant diagnostics from this hospitalization (including imaging, microbiology, ancillary and laboratory) are listed below for reference.   Microbiology: Recent Results (from the past 240 hour(s))  Resp Panel by RT-PCR (Flu A&B, Covid) Nasopharyngeal Swab     Status: None    Collection Time: 12/27/19  2:55 PM   Specimen: Nasopharyngeal Swab; Nasopharyngeal(NP) swabs in vial transport medium  Result Value Ref Range Status   SARS Coronavirus 2 by RT PCR NEGATIVE NEGATIVE Final    Comment: (NOTE) SARS-CoV-2 target nucleic acids are NOT DETECTED.  The SARS-CoV-2 RNA is generally detectable in upper respiratory specimens during the acute phase of infection. The lowest concentration of SARS-CoV-2 viral copies this assay can detect is 138 copies/mL. A negative result does not preclude SARS-Cov-2 infection and should not be used as the sole basis for treatment or other patient management decisions. A negative result may occur with  improper specimen collection/handling, submission of specimen other than nasopharyngeal swab, presence of viral mutation(s) within the areas targeted by this assay, and inadequate number of viral copies(<138 copies/mL). A negative result must be combined with clinical observations, patient history, and epidemiological information. The expected result is Negative.  Fact Sheet for Patients:  BloggerCourse.com  Fact Sheet for Healthcare Providers:  SeriousBroker.it  This test is no t yet approved or cleared by the [place] FDA and  has been authorized for detection and/or diagnosis of SARS-CoV-2 by FDA under an Emergency Use Authorization (EUA). This EUA will remain  in effect (meaning this test can be used) for the duration of the COVID-19 declaration under Section 564(b)(1) of the Act, 21 U.S.C.section 360bbb-3(b)(1), unless the authorization is terminated  or revoked sooner.       Influenza A by PCR NEGATIVE NEGATIVE Final   Influenza B by PCR NEGATIVE NEGATIVE Final    Comment: (NOTE) The Xpert Xpress SARS-CoV-2/FLU/RSV plus assay is intended as an aid in the diagnosis of influenza from Nasopharyngeal swab specimens and should not be used as a sole basis for treatment.  Nasal washings and aspirates are unacceptable for Xpert Xpress SARS-CoV-2/FLU/RSV testing.  Fact Sheet for Patients: BloggerCourse.com  Fact Sheet for Healthcare Providers: SeriousBroker.it  This test is not yet approved or cleared by the [place] FDA and has been authorized for detection and/or diagnosis of SARS-CoV-2 by FDA under an Emergency Use Authorization (EUA). This EUA will remain in effect (meaning this test can be used) for the duration of the COVID-19 declaration under Section 564(b)(1) of the Act, 21 U.S.C. section 360bbb-3(b)(1), unless the authorization is terminated or revoked.  Performed at [facility], [address], [place], [place] [address]      Labs: BNP (last 3 results) Recent Labs    12/27/19 2305  BNP 1,391.1*   Basic Metabolic Panel: Recent Labs  Lab 12/27/19 1439 12/28/19 0313 12/29/19 0218 12/30/19 0428 12/31/19 0412 01/01/20 0307 01/02/20 0453  NA 141   < > 139 141 142 142 141  K 5.2*   < > 4.0 3.7 3.6 4.0 4.2  CL 107   < > 109 110 109 112* 107  CO2 20*   < > 19* 19* 20* 18* 20*  GLUCOSE 70   < > 104*  111* 104* 107* 94  BUN 28*   < > 24* 18 16 20 19   CREATININE 1.68*   < > 1.45* 1.42* 1.39* 1.59* 1.38*  CALCIUM 10.0   < > 9.3 9.2 9.2 9.3 9.6  MG 2.3  --  1.9 1.9 1.7  --   --    < > = values in this interval not displayed.   Liver Function Tests: Recent Labs  Lab 12/27/19 1439 12/28/19 0313  AST 23 33  ALT 13 15  ALKPHOS 59 57  BILITOT 0.8 0.8  PROT 8.2* 7.2  ALBUMIN 4.4 3.9   No results for input(s): LIPASE, AMYLASE in the last 168 hours. No results for input(s): AMMONIA in the last 168 hours. CBC: Recent Labs  Lab 12/27/19 1439 12/29/19 0218  WBC 10.7* 8.9  NEUTROABS 9.6*  --   HGB 11.6* 10.7*  HCT 37.5 33.0*  MCV 102.7* 97.9  PLT 239 236   Cardiac Enzymes: Recent Labs  Lab 12/27/19 1439  CKTOTAL 140   BNP: Invalid input(s):  POCBNP CBG: Recent Labs  Lab 01/01/20 1628 01/01/20 2040 01/01/20 2349 01/02/20 0458 01/02/20 0811  GLUCAP 94 135* 101* 97 103*   D-Dimer No results for input(s): DDIMER in the last 72 hours. Hgb A1c No results for input(s): HGBA1C in the last 72 hours. Lipid Profile No results for input(s): CHOL, HDL, LDLCALC, TRIG, CHOLHDL, LDLDIRECT in the last 72 hours. Thyroid function studies No results for input(s): TSH, T4TOTAL, T3FREE, THYROIDAB in the last 72 hours.  Invalid input(s): FREET3 Anemia work up No results for input(s): VITAMINB12, FOLATE, FERRITIN, TIBC, IRON, RETICCTPCT in the last 72 hours. Urinalysis    Component Value Date/Time   COLORURINE STRAW (A) 12/27/2019 1707   APPEARANCEUR CLEAR 12/27/2019 1707   LABSPEC 1.010 12/27/2019 1707   PHURINE 6.0 12/27/2019 1707   GLUCOSEU NEGATIVE 12/27/2019 1707   HGBUR SMALL (A) 12/27/2019 1707   BILIRUBINUR NEGATIVE 12/27/2019 1707   KETONESUR NEGATIVE 12/27/2019 1707   PROTEINUR 100 (A) 12/27/2019 1707   UROBILINOGEN 0.2 03/05/2014 1324   NITRITE NEGATIVE 12/27/2019 1707   LEUKOCYTESUR NEGATIVE 12/27/2019 1707   Sepsis Labs Invalid input(s): PROCALCITONIN,  WBC,  LACTICIDVEN Microbiology Recent Results (from the past 240 hour(s))  Resp Panel by RT-PCR (Flu A&B, Covid) Nasopharyngeal Swab     Status: None   Collection Time: 12/27/19  2:55 PM   Specimen: Nasopharyngeal Swab; Nasopharyngeal(NP) swabs in vial transport medium  Result Value Ref Range Status   SARS Coronavirus 2 by RT PCR NEGATIVE NEGATIVE Final    Comment: (NOTE) SARS-CoV-2 target nucleic acids are NOT DETECTED.  The SARS-CoV-2 RNA is generally detectable in upper respiratory specimens during the acute phase of infection. The lowest concentration of SARS-CoV-2 viral copies this assay can detect is 138 copies/mL. A negative result does not preclude SARS-Cov-2 infection and should not be used as the sole basis for treatment or other patient management  decisions. A negative result may occur with  improper specimen collection/handling, submission of specimen other than nasopharyngeal swab, presence of viral mutation(s) within the areas targeted by this assay, and inadequate number of viral copies(<138 copies/mL). A negative result must be combined with clinical observations, patient history, and epidemiological information. The expected result is Negative.  Fact Sheet for Patients:  BloggerCourse.com  Fact Sheet for Healthcare Providers:  SeriousBroker.it  This test is no t yet approved or cleared by the [place] FDA and  has been authorized for detection and/or diagnosis of SARS-CoV-2 by  FDA under an Emergency Use Authorization (EUA). This EUA will remain  in effect (meaning this test can be used) for the duration of the COVID-19 declaration under Section 564(b)(1) of the Act, 21 U.S.C.section 360bbb-3(b)(1), unless the authorization is terminated  or revoked sooner.       Influenza A by PCR NEGATIVE NEGATIVE Final   Influenza B by PCR NEGATIVE NEGATIVE Final    Comment: (NOTE) The Xpert Xpress SARS-CoV-2/FLU/RSV plus assay is intended as an aid in the diagnosis of influenza from Nasopharyngeal swab specimens and should not be used as a sole basis for treatment. Nasal washings and aspirates are unacceptable for Xpert Xpress SARS-CoV-2/FLU/RSV testing.  Fact Sheet for Patients: BloggerCourse.com  Fact Sheet for Healthcare Providers: SeriousBroker.it  This test is not yet approved or cleared by the [place] FDA and has been authorized for detection and/or diagnosis of SARS-CoV-2 by FDA under an Emergency Use Authorization (EUA). This EUA will remain in effect (meaning this test can be used) for the duration of the COVID-19 declaration under Section 564(b)(1) of the Act, 21 U.S.C. section 360bbb-3(b)(1), unless the  authorization is terminated or revoked.  Performed at [facility], [address] [place], [place] [address]     Procedures/Studies: CT ANGIO HEAD W OR WO CONTRAST  Result Date: 12/27/2019 CLINICAL DATA:  Initial evaluation for acute stroke. EXAM: CT ANGIOGRAPHY HEAD AND NECK TECHNIQUE: Multidetector CT imaging of the head and neck was performed using the standard protocol during bolus administration of intravenous contrast. Multiplanar CT image reconstructions and MIPs were obtained to evaluate the vascular anatomy. Carotid stenosis measurements (when applicable) are obtained utilizing NASCET criteria, using the distal internal carotid diameter as the denominator. CONTRAST:  57mL OMNIPAQUE IOHEXOL 350 MG/ML SOLN COMPARISON:  Prior head CT from earlier same day. FINDINGS: CTA NECK FINDINGS Aortic arch: Visualized aortic arch of normal caliber with normal branch pattern. Mild atheromatous change about the aortic arch. No hemodynamically significant stenosis seen about the origin of the great vessels. Right carotid system: Right common and internal carotid arteries are tortuous but widely patent without stenosis, dissection or occlusion. Left carotid system: Left CCA tortuous but widely patent to the bifurcation without stenosis. Atheromatous narrowing at the origin of the left ICA measuring up to 35% by NASCET criteria. Left ICA tortuous but otherwise patent distally to the skull base without stenosis, dissection or occlusion. Vertebral arteries: Both vertebral arteries arise from the subclavian arteries. No proximal subclavian artery stenosis. Right vertebral artery dominant. Vertebral arteries mildly tortuous but patent without stenosis, dissection or occlusion. Skeleton: No acute osseous abnormality. No discrete or worrisome osseous lesions. Other neck: No other acute soft tissue abnormality within the neck. No mass or adenopathy. Upper chest: Small layering bilateral pleural effusions  partially visualized. Emphysematous changes noted within the visualized lungs. Review of the MIP images confirms the above findings CTA HEAD FINDINGS Anterior circulation: Petrous segments patent bilaterally. Scattered atheromatous plaque throughout the carotid siphons with no more than mild multifocal stenosis. 3 mm aneurysm extending medially and slightly posteriorly seen arising from the cavernous right ICA (series 7, image 106). Additional subtle 3 mm outpouching extending inferiorly and slightly medially from the cavernous left ICA also suspicious for aneurysm (series 8, image 89). ICA termini well perfused. A1 segments patent bilaterally. Normal anterior communicating artery complex. Anterior cerebral arteries patent to their distal aspects without stenosis. Atheromatous irregularity within the M1 segments without high-grade stenosis or occlusion. Normal MCA bifurcations. Distal MCA branches well perfused, although  demonstrate moderate small vessel atheromatous irregularity. Posterior circulation: Both vertebral arteries patent to the vertebrobasilar junction without stenosis. Both PICA patent. Basilar patent to its distal aspect without stenosis. Superior cerebral arteries patent bilaterally. 3 mm aneurysm seen extending superiorly and slightly posteriorly from the basilar tip (series 7, image 99). Both PCAs primarily supplied via the basilar. Scattered atheromatous irregularity throughout the PCA segments bilaterally without high-grade or flow-limiting stenosis. Both PCAs remain well perfused to their distal aspects. Venous sinuses: Patent. Anatomic variants: None significant. Review of the MIP images confirms the above findings IMPRESSION: 1. Negative CTA for large vessel occlusion. 2. 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 3. Diffuse tortuosity of the major arterial vasculature of the head and neck, suggesting chronic underlying hypertension. 4. 3 mm bilateral cavernous ICA aneurysms,  with additional 3 mm basilar tip aneurysm. 5. Small layering bilateral pleural effusions. 6. Emphysema (ICD10-J43.9). Electronically Signed   By: Rise Mu M.D.   On: 12/27/2019 23:11   CT Head Wo Contrast  Result Date: 12/27/2019 CLINICAL DATA:  Delirium. EXAM: CT HEAD WITHOUT CONTRAST TECHNIQUE: Contiguous axial images were obtained from the base of the skull through the vertex without intravenous contrast. COMPARISON:  MRI July 29 21, CT head July 23, 2018. FINDINGS: Brain: No evidence of acute large vascular territory infarction, hemorrhage, hydrocephalus, extra-axial collection or mass lesion/mass effect. Moderate to advanced scattered patchy hypodensity throughout the white matter, compatible with chronic microvascular ischemic disease. Similar remote lacunar infarcts involving bilateral basal ganglia and right thalamus. Similar high paramedian left frontal cortical infarct. Generalized atrophy. Vascular: Calcific atherosclerosis. Skull: No acute fracture. Sinuses/Orbits: No acute finding. Other: No mastoid effusions. IMPRESSION: 1. No evidence of acute intracranial abnormality. 2. Similar moderate to advanced chronic microvascular ischemic disease and remote infarcts. Electronically Signed   By: Feliberto Harts MD   On: 12/27/2019 15:40   CT ANGIO NECK W OR WO CONTRAST  Result Date: 12/27/2019 CLINICAL DATA:  Initial evaluation for acute stroke. EXAM: CT ANGIOGRAPHY HEAD AND NECK TECHNIQUE: Multidetector CT imaging of the head and neck was performed using the standard protocol during bolus administration of intravenous contrast. Multiplanar CT image reconstructions and MIPs were obtained to evaluate the vascular anatomy. Carotid stenosis measurements (when applicable) are obtained utilizing NASCET criteria, using the distal internal carotid diameter as the denominator. CONTRAST:  75mL OMNIPAQUE IOHEXOL 350 MG/ML SOLN COMPARISON:  Prior head CT from earlier same day. FINDINGS: CTA NECK  FINDINGS Aortic arch: Visualized aortic arch of normal caliber with normal branch pattern. Mild atheromatous change about the aortic arch. No hemodynamically significant stenosis seen about the origin of the great vessels. Right carotid system: Right common and internal carotid arteries are tortuous but widely patent without stenosis, dissection or occlusion. Left carotid system: Left CCA tortuous but widely patent to the bifurcation without stenosis. Atheromatous narrowing at the origin of the left ICA measuring up to 35% by NASCET criteria. Left ICA tortuous but otherwise patent distally to the skull base without stenosis, dissection or occlusion. Vertebral arteries: Both vertebral arteries arise from the subclavian arteries. No proximal subclavian artery stenosis. Right vertebral artery dominant. Vertebral arteries mildly tortuous but patent without stenosis, dissection or occlusion. Skeleton: No acute osseous abnormality. No discrete or worrisome osseous lesions. Other neck: No other acute soft tissue abnormality within the neck. No mass or adenopathy. Upper chest: Small layering bilateral pleural effusions partially visualized. Emphysematous changes noted within the visualized lungs. Review of the MIP images confirms the above findings  CTA HEAD FINDINGS Anterior circulation: Petrous segments patent bilaterally. Scattered atheromatous plaque throughout the carotid siphons with no more than mild multifocal stenosis. 3 mm aneurysm extending medially and slightly posteriorly seen arising from the cavernous right ICA (series 7, image 106). Additional subtle 3 mm outpouching extending inferiorly and slightly medially from the cavernous left ICA also suspicious for aneurysm (series 8, image 89). ICA termini well perfused. A1 segments patent bilaterally. Normal anterior communicating artery complex. Anterior cerebral arteries patent to their distal aspects without stenosis. Atheromatous irregularity within the M1  segments without high-grade stenosis or occlusion. Normal MCA bifurcations. Distal MCA branches well perfused, although demonstrate moderate small vessel atheromatous irregularity. Posterior circulation: Both vertebral arteries patent to the vertebrobasilar junction without stenosis. Both PICA patent. Basilar patent to its distal aspect without stenosis. Superior cerebral arteries patent bilaterally. 3 mm aneurysm seen extending superiorly and slightly posteriorly from the basilar tip (series 7, image 99). Both PCAs primarily supplied via the basilar. Scattered atheromatous irregularity throughout the PCA segments bilaterally without high-grade or flow-limiting stenosis. Both PCAs remain well perfused to their distal aspects. Venous sinuses: Patent. Anatomic variants: None significant. Review of the MIP images confirms the above findings IMPRESSION: 1. Negative CTA for large vessel occlusion. 2. 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 3. Diffuse tortuosity of the major arterial vasculature of the head and neck, suggesting chronic underlying hypertension. 4. 3 mm bilateral cavernous ICA aneurysms, with additional 3 mm basilar tip aneurysm. 5. Small layering bilateral pleural effusions. 6. Emphysema (ICD10-J43.9). Electronically Signed   By: Rise Mu M.D.   On: 12/27/2019 23:11   MR BRAIN WO CONTRAST  Result Date: 12/29/2019 CLINICAL DATA:  Seizure. Left lateralized periodic discharges on EEG. EXAM: MRI HEAD WITHOUT CONTRAST TECHNIQUE: Multiplanar, multiecho pulse sequences of the brain and surrounding structures were obtained without intravenous contrast. COMPARISON:  Head CT and CTA 12/27/2019 and MRI 08/29/2019 FINDINGS: Brain: No acute infarct, mass, midline shift, or extra-axial fluid collection is identified. Numerous chronic microhemorrhages throughout both cerebral hemispheres have not significantly changed from the prior MRI, located both superficially as well as within the  deep gray nuclei. There is a single focus of chronic microhemorrhage in the right cerebellum which was not apparent on the prior MRI. A chronic infarct is again noted in the medial left frontoparietal region/cingulate gyrus with associated chronic blood products. There are also unchanged chronic lacunar infarcts in the basal ganglia, deep cerebral white matter bilaterally, and right thalamus. Patchy and confluent T2 hyperintensities elsewhere in the cerebral white matter and pons are unchanged and nonspecific but compatible with severe chronic small vessel ischemic disease. A small chronic right cerebellar infarct is unchanged. There is moderate generalized cerebral atrophy. Dedicated temporal lobe imaging is motion degraded which limits detailed assessment, however the hippocampi are grossly symmetric in size and signal. Vascular: Major intracranial vascular flow voids are preserved. Skull and upper cervical spine: Unremarkable bone marrow signal. Sinuses/Orbits: Bilateral cataract extraction. Chronic left sphenoid sinusitis. Clear mastoid air cells. Other: None. IMPRESSION: 1. No acute intracranial abnormality. 2. Severe chronic small vessel ischemic disease with multiple chronic infarcts and chronic microhemorrhages as above. Electronically Signed   By: Sebastian Ache M.D.   On: 12/29/2019 18:58   DG Chest Port 1 View  Result Date: 12/27/2019 CLINICAL DATA:  Unresponsive EXAM: PORTABLE CHEST 1 VIEW COMPARISON:  11/17/2017, 05/19/2015 FINDINGS: No focal opacity or pleural effusion. Mild cardiomegaly. Enlarged mediastinal silhouette, likely augmented by patient rotation. IMPRESSION: Mild cardiomegaly. Enlarged  mediastinal silhouette, likely augmented by patient rotation. Electronically Signed   By: Jasmine Pang M.D.   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 Electrical activity was acquired at a sampling rate of 500Hz  and reviewed with a high frequency filter of 70Hz  and a low frequency filter of 1Hz . EEG data were recorded continuously and digitally stored. BACKGROUND ACTIVITY: Posterior dominant rhythm: There is a poorly sustained posterior dominant rhythm of 10 Hz seen at times. Slowing: Generalized irregular delta and theta range activity are present throughout the study. There is also focal irregular left posterior quadrant slow activity. EPILEPTIFORM ACTIVITY: Interictal epileptiform activity: Lateralized periodic discharges(LPDs) with spike or polyspike morphology, sometimes with accompanying slow wave are seen at P7 > P3,O1 with a frequency of 1 Hz. Ictal Activity: None OTHER EVENTS: None ACTIVATION PROCEDURES: Hyperventilation and photic stimulation were not performed. IMPRESSION: This study recorded a pattern consistent with severe cortical irritability in the left parieto-occipital region. There was no definite seizure recorded, though the pattern does lie on the ictal-interictal continuum. , MD [facility] [phone] If 7pm- 7am, please page neurology on call as listed in AMION.   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 EEG  to evaluate for seizure Level of alertness: Awake, asleep AEDs during EEG study: LEV, LM Technical aspects: This EEG study was done with scalp electrodes positioned according to the 10-20 International system of electrode placement. Electrical activity was acquired at a sampling rate of 500Hz  and reviewed with a high frequency filter of 70Hz  and a low frequency filter of 1Hz . EEG data were recorded continuously and digitally stored. Description: The posterior dominant rhythm consists of 10 Hz activity of moderate voltage (25-35 uV) seen predominantly in posterior head regions, symmetric and reactive to eye opening and eye closing.  Sleep was characterized by vertex waves, sleep spindles (12 to 14 Hz), maximal frontocentral region.  EEG showed continuous generalized and maximal left posterior quadrant 3 to 6 Hz theta-delta slowing. Lateralized periodic discharges with spike or polyspike morphology were seen in left posterior quadrant, maximal P7 > P3,O1, at 0.25-1hz .  Hyperventilation and photic stimulation were not performed.   ABNORMALITY -Continuous slow, generalized and maximal left posterior quadrant - Lateralized epileptiform discharges, left posterior quadrant IMPRESSION: This study showed evidence of epileptogenicity as well as cortical dysfunction arising from left posterior quadrant likely due to underlying structural abnormality. The LPDs area seen at 0.25-1hz  which is on the ictal-interictal continuum with higher potential for seizures.  Additionally there is mild to moderate diffuse encephalopathy, non specific etiology. No seizures were seen throughout the recording. 12/30/2019   MM 3D SCREEN BREAST BILATERAL  Addendum Date: 12/16/2019   ADDENDUM REPORT: 12/16/2019 16:40 ADDENDUM: The report should read as follows: CLINICAL DATA:  Screening. EXAM:  DIGITAL SCREENING BILATERAL MAMMOGRAM WITH TOMO AND CAD COMPARISON:  Previous exam(s). ACR Breast Density Category d: The breast tissue is  extremely dense, which lowers the sensitivity of mammography FINDINGS: There are no findings suspicious for malignancy. Images were processed with CAD. IMPRESSION: No mammographic evidence of malignancy. A result letter of this screening mammogram will be mailed directly to the patient. RECOMMENDATION: Screening mammogram in one year. (Code:SM-B-01Y) BI-RADS CATEGORY  1: Negative. Electronically Signed   By: Beckie Salts M.D.   On: 12/16/2019 16:40   Result Date: 12/16/2019 : Please Insert Correct Screening Template Electronically Signed: By: Beckie Salts M.D. On: 12/16/2019 12:01   ECHOCARDIOGRAM COMPLETE BUBBLE STUDY  Result Date: 12/28/2019    ECHOCARDIOGRAM REPORT   Patient Name:   ANNABELL OCONNOR Date of Exam: 12/28/2019 Medical Rec #:  [id number]              Height:       61.0 in Accession #:    [id number]             Weight:       80.2 lb Date of Birth:  08/27/1940              BSA:          1.281 m Patient Age:    79 years               BP:           175/95 mmHg Patient Gender: F                      HR:           73 bpm. Exam Location:  Inpatient Procedure: 2D Echo, Cardiac Doppler, Color Doppler and Saline Contrast Bubble            Study Indications:    Stroke 434.91/ I63.9  History:        Patient has prior history of Echocardiogram examinations, most                 recent 10/16/2019. Seizure.  Sonographer:    Roosvelt Maser RDCS Referring Phys: [id number] Deno Lunger [facility] IMPRESSIONS  1. Left ventricular ejection fraction, by estimation, is 40 to 45%. The left ventricle has mildly decreased function. The left ventricle demonstrates global hypokinesis. Left ventricular diastolic parameters were normal.  2. Right ventricular systolic function is normal. The right ventricular size is normal. There is mildly elevated pulmonary artery systolic pressure.  3. Left atrial size was moderately dilated.  4. The mitral valve is abnormal. Trivial mitral valve regurgitation.  5. The aortic valve is  tricuspid. Aortic valve regurgitation is mild. Mild aortic valve sclerosis is present, with no evidence of aortic valve stenosis.  6. The inferior vena cava is normal in size with greater than 50% respiratory variability, suggesting right atrial pressure of 3 mmHg.  7. Agitated saline contrast bubble study was negative, with no evidence of any interatrial shunt. Comparison(s): 10/16/19: LVEF 30-35%, global HK. FINDINGS  Left Ventricle: Left ventricular ejection fraction, by estimation, is 40 to 45%. The left ventricle has mildly decreased function. The left ventricle demonstrates global hypokinesis. The left ventricular internal cavity size was normal in size. There is  no left ventricular hypertrophy. Left ventricular diastolic function could not be evaluated due to indeterminate diastolic function. Left ventricular diastolic parameters were normal. Right Ventricle: The right ventricular size is normal. No increase in right ventricular wall thickness. Right  ventricular systolic function is normal. There is mildly elevated pulmonary artery systolic pressure. The tricuspid regurgitant velocity is 3.01  m/s, and with an assumed right atrial pressure of 3 mmHg, the estimated right ventricular systolic pressure is 39.2 mmHg. Left Atrium: Left atrial size was moderately dilated. Right Atrium: Right atrial size was normal in size. Pericardium: There is no evidence of pericardial effusion. Mitral Valve: The mitral valve is abnormal. There is mild thickening of the mitral valve leaflet(s). Trivial mitral valve regurgitation. Tricuspid Valve: The tricuspid valve is grossly normal. Tricuspid valve regurgitation is mild. Aortic Valve: The aortic valve is tricuspid. Aortic valve regurgitation is mild. Mild aortic valve sclerosis is present, with no evidence of aortic valve stenosis. Pulmonic Valve: The pulmonic valve was grossly normal. Pulmonic valve regurgitation is trivial. Aorta: The aortic root and ascending aorta are  structurally normal, with no evidence of dilitation. Venous: The inferior vena cava is normal in size with greater than 50% respiratory variability, suggesting right atrial pressure of 3 mmHg. IAS/Shunts: No atrial level shunt detected by color flow Doppler. Agitated saline contrast was given intravenously to evaluate for intracardiac shunting. Agitated saline contrast bubble study was negative, with no evidence of any interatrial shunt.  LEFT VENTRICLE PLAX 2D LVIDd:         3.30 cm     Diastology LVIDs:         2.40 cm     LV e' lateral: 4.13 cm/s LV PW:         0.80 cm LV IVS:        0.90 cm LVOT diam:     1.90 cm LV SV:         37 LV SV Index:   29 LVOT Area:     2.84 cm  LV Volumes (MOD) LV vol d, MOD A2C: 52.1 ml LV vol d, MOD A4C: 53.3 ml LV vol s, MOD A2C: 29.9 ml LV vol s, MOD A4C: 31.7 ml LV SV MOD A2C:     22.2 ml LV SV MOD A4C:     53.3 ml LV SV MOD BP:      23.1 ml RIGHT VENTRICLE RV Basal diam:  3.30 cm RV S prime:     10.40 cm/s TAPSE (M-mode): 1.4 cm LEFT ATRIUM             Index       RIGHT ATRIUM           Index LA diam:        2.80 cm 2.19 cm/m  RA Area:     15.00 cm LA Vol (A2C):   64.7 ml 50.50 ml/m RA Volume:   40.30 ml  31.45 ml/m LA Vol (A4C):   44.1 ml 34.42 ml/m LA Biplane Vol: 58.4 ml 45.58 ml/m  AORTIC VALVE LVOT Vmax:   65.30 cm/s LVOT Vmean:  41.700 cm/s LVOT VTI:    0.132 m  AORTA Ao Root diam: 3.50 cm Ao Asc diam:  3.20 cm TRICUSPID VALVE TR Peak grad:   36.2 mmHg TR Vmax:        301.00 cm/s  SHUNTS Systemic VTI:  0.13 m Systemic Diam: 1.90 cm Zoila Shutter MD Electronically signed by Zoila Shutter MD Signature Date/Time: 12/28/2019/12:27:39 PM    Final    VAS [place] UPPER EXTREMITY VENOUS DUPLEX  Result Date: 01/01/2020 UPPER VENOUS STUDY  Indications: Edema Risk Factors: None identified. Limitations: Body habitus. Comparison Study: No prior studies. Performing Technologist: Chanda Busing RVT  Examination  Guidelines: A complete evaluation includes B-mode imaging, spectral  Doppler, color Doppler, and power Doppler as needed of all accessible portions of each vessel. Bilateral testing is considered an integral part of a complete examination. Limited examinations for reoccurring indications may be performed as noted.  Right Findings: +----------+------------+---------+-----------+----------+-------+ RIGHT     CompressiblePhasicitySpontaneousPropertiesSummary +----------+------------+---------+-----------+----------+-------+ Subclavian    Full       Yes       Yes                      +----------+------------+---------+-----------+----------+-------+  Left Findings: +----------+------------+---------+-----------+----------+-------+ LEFT      CompressiblePhasicitySpontaneousPropertiesSummary +----------+------------+---------+-----------+----------+-------+ IJV           Full       Yes       Yes                      +----------+------------+---------+-----------+----------+-------+ Subclavian    Full       Yes       Yes                      +----------+------------+---------+-----------+----------+-------+ Axillary      Full       Yes       Yes                      +----------+------------+---------+-----------+----------+-------+ Brachial      Full       Yes       Yes                      +----------+------------+---------+-----------+----------+-------+ Radial        Full                                          +----------+------------+---------+-----------+----------+-------+ Ulnar         Full                                          +----------+------------+---------+-----------+----------+-------+ Cephalic      Full                                          +----------+------------+---------+-----------+----------+-------+ Basilic       Full                                          +----------+------------+---------+-----------+----------+-------+  Summary:  Right: No evidence of thrombosis in the  subclavian.  Left: No evidence of deep vein thrombosis in the upper extremity. No evidence of superficial vein thrombosis in the upper extremity.  *See table(s) above for measurements and observations.  Diagnosing physician: Coral Else MD Electronically signed by Coral Else MD on 01/01/2020 at 8:56:02 PM.    Final      Time coordinating discharge: Over 30 minutes    Lewie Chamber, MD  [facility] 01/02/2020, 10:52 AM

## 2020-01-02 NOTE — Progress Notes (Signed)
Occupational Therapy Treatment Patient Details Name: [name] MRN: [id number] DOB: [birth year] Today's Date: 01/02/2020    History of present illness 79 year old female with past medical history of systolic and diastolic congestive heart failure; nonischemic cardiomyopathy, seizure disorder, chronic kidney disease stage IIIb, previous stroke (2014), gout, hypertension, and Alzheimer's dementia who presents to [facility] emergency department via EMS after being found with suspected seizure activity by her significant other. MRI  - no acute intracranial abnormality; severe chronic small vessel ischemic disease with multiple chronic infarcts and microhemorrhages.    OT comments   Pt continues to present with decreased cognition, balance, coordination and safety impacting her functional performance. Pt performing functional mobility with Min-Mod A and RW to sink. Completing peri care and grooming with Mod A for standing balance and Max cues for intitating tasks. HR 90-110s. Continue to recommend dc to SNF and will continue to follow acutely as admited.    Follow Up Recommendations  SNF;Supervision/Assistance - 24 hour    Equipment Recommendations  3 in 1 bedside commode    Recommendations for Other Services      Precautions / Restrictions Precautions Precautions: Fall Precaution Comments: seizure precautions       Mobility Bed Mobility Overal bed mobility: Needs Assistance Bed Mobility: Supine to Sit     Supine to sit: Mod assist     General bed mobility comments: Mod A to elevate trunk adn bring hips towards EOB  Transfers Overall transfer level: Needs assistance Equipment used: Rolling walker (2 wheeled);1 person hand held assist Transfers: Sit to/from Stand Sit to Stand: Min assist         General transfer comment: Min A for power up and to gain balance in standing    Balance Overall balance assessment: Needs assistance Sitting-balance support:  Single extremity supported;No upper extremity supported;Feet supported Sitting balance-Leahy Scale: Poor     Standing balance support: Bilateral upper extremity supported;During functional activity Standing balance-Leahy Scale: Poor Standing balance comment: Reliant on Min-Mod A for standing balance at sink                           ADL either performed or assessed with clinical judgement   ADL Overall ADL's : Needs assistance/impaired     Grooming: Wash/dry face;Minimal assistance;Moderate assistance;Standing Grooming Details (indicate cue type and reason): Initially requiring Min A for standing balance and progressing to Mod A with right lateral lean. Pt requiring Max cues for washing her face.                  Toilet Transfer: Minimal assistance;Ambulation;RW (simulated in room) Toilet Transfer Details (indicate cue type and reason): Min A for maintaining balance in standing Toileting- Clothing Manipulation and Hygiene: Maximal assistance;Sit to/from stand Toileting - Clothing Manipulation Details (indicate cue type and reason): Pt requiring hand over hand to initate peri care and cleaning posterior area. Pt becoming distracted quickly and requiring further physical A for task     Functional mobility during ADLs: Moderate assistance;Rolling walker General ADL Comments: Pt continues to requiring physical A for balance and Max cues due to decreased  cognition.     Vision       Perception     Praxis      Cognition Arousal/Alertness: Awake/alert Behavior During Therapy: WFL for tasks assessed/performed Overall Cognitive Status: History of cognitive impairments - at baseline Area of Impairment: Attention;Memory;Following commands;Safety/judgement;Awareness;Problem solving  Orientation Level: Disoriented to;Time;Situation Current Attention Level: Focused Memory: Decreased recall of precautions;Decreased short-term memory Following  Commands: Follows one step commands inconsistently Safety/Judgement: Decreased awareness of safety;Decreased awareness of deficits Awareness: Intellectual Problem Solving: Slow processing;Difficulty sequencing;Decreased initiation General Comments: Historyof dementia. Pt presenting with poor problem solvine, attention, and awareness. Pt perseverating on first topc presenting. asked pt about her sons and she repeated that "I have one son in [place] and one in [place]". continued repeating this phrase until asked to sit at EOB. Once at sink, pt perseverating on wringing out wash clothe - repeating until given hand over hadn assist to perform peri care.        Exercises     Shoulder Instructions       General Comments HR 90-110s    Pertinent Vitals/ Pain       Pain Assessment: No/denies pain Pain Intervention(s): Monitored during session  Home Living                                          Prior Functioning/Environment              Frequency  Min 2X/week        Progress Toward Goals  OT Goals(current goals can now be found in the care plan section)  Progress towards OT goals: Progressing toward goals  Acute Rehab OT Goals Patient Stated Goal: to reduce falls risk and return toward OT Goal Formulation: With family Time For Goal Achievement: 01/13/20 Potential to Achieve Goals: Good ADL Goals Pt Will Perform Grooming: with min assist;sitting Pt Will Perform Upper Body Bathing: with min assist;sitting Pt Will Perform Lower Body Bathing: with min assist;sit to/from stand Pt Will Transfer to Toilet: with mod assist;ambulating Additional ADL Goal #1: Pt will complete functional grooming task in non-distracting environment with minimal redirectional cures  Plan Discharge plan remains appropriate    Co-evaluation    PT/OT/SLP Co-Evaluation/Treatment:  (Dove tail)            AM-PAC OT "6 Clicks" Daily Activity     Outcome Measure   Help from  another person eating meals?: A Lot Help from another person taking care of personal grooming?: A Lot Help from another person toileting, which includes using toliet, bedpan, or urinal?: Total Help from another person bathing (including washing, rinsing, drying)?: A Lot Help from another person to put on and taking off regular upper body clothing?: A Lot Help from another person to put on and taking off regular lower body clothing?: Total 6 Click Score: 10    End of Session Equipment Utilized During Treatment: Gait belt;Rolling walker  OT Visit Diagnosis: Unsteadiness on feet (R26.81);Other abnormalities of gait and mobility (R26.89);Muscle weakness (generalized) (M62.81);Apraxia (R48.2);Other symptoms and signs involving cognitive function;Other symptoms and signs involving the nervous system (R29.898);Cognitive communication deficit (R41.841);Pain Symptoms and signs involving cognitive functions: Other cerebrovascular disease Pain - Right/Left: Right Pain - part of body: Knee   Activity Tolerance Patient tolerated treatment well   Patient Left Other (comment) (in room with PT)   Nurse Communication Mobility status        Time: 0354-6568 OT Time Calculation (min): 27 min  Charges: OT General Charges $OT Visit: 1 Visit OT Treatments $Self Care/Home Management : 8-22 mins  Aleila Syverson MSOT, OTR/L Acute Rehab Pager: [phone] Office: [phone]   Theodoro Grist Bianney Rockwood 01/02/2020, 2:52 PM

## 2020-01-02 NOTE — Progress Notes (Signed)
Pt left off unit with PTAR. Called to give report to [facility]. Spoke with the secretary Gavin Pound who took my number down for the nurse to give a call back to obtain report.

## 2020-01-03 ENCOUNTER — Non-Acute Institutional Stay (SKILLED_NURSING_FACILITY): Payer: Medicare HMO | Admitting: Internal Medicine

## 2020-01-03 ENCOUNTER — Encounter: Payer: Self-pay | Admitting: Internal Medicine

## 2020-01-03 DIAGNOSIS — F0280 Dementia in other diseases classified elsewhere without behavioral disturbance: Secondary | ICD-10-CM

## 2020-01-03 DIAGNOSIS — R569 Unspecified convulsions: Secondary | ICD-10-CM | POA: Diagnosis not present

## 2020-01-03 DIAGNOSIS — I428 Other cardiomyopathies: Secondary | ICD-10-CM | POA: Diagnosis not present

## 2020-01-03 DIAGNOSIS — G309 Alzheimer's disease, unspecified: Secondary | ICD-10-CM

## 2020-01-03 NOTE — Assessment & Plan Note (Signed)
MRI of the brain revealed severe chronic small vessel ischemic disease with multiple chronic infarcts and chronic microhemorrhages.  These findings suggest the etiology of her dementia is vascular in nature.

## 2020-01-03 NOTE — Patient Instructions (Signed)
See assessment and plan under each diagnosis in the problem list and acutely for this visit 

## 2020-01-03 NOTE — Assessment & Plan Note (Signed)
Monitor for recurrent seizures at SNF.  Neurology follow-up as scheduled.

## 2020-01-03 NOTE — Progress Notes (Signed)
   NURSING HOME LOCATION:  [facility] ROOM NUMBER:  [id number]  CODE STATUS:  FULL CODE  PCP:   [name], MD  [phone] [address] [place] [address]  This is a comprehensive admission note to [facility] performed on this date less than 30 days from date of admission. Included are preadmission medical/surgical history; reconciled medication list; family history; social history and comprehensive review of systems.  Corrections and additions to the records were documented. Comprehensive physical exam was also performed. Additionally a clinical summary was entered for each active diagnosis pertinent to this admission in the Problem List to enhance continuity of care.  HPI: Patient was hospitalized 11/26-12/03/2019 presenting to the ED from home via EMS following a breakthrough seizure.  This is in the context of PMH significant for seizure disorder, dementia, and history of ischemic CVA in 2014. EMS administered IV benzodiazepine; she received IV Keppra in the ED.  CT of the head revealed no acute intracranial abnormality and CTA of the head/neck revealed no large vessel occlusion.  EEG initially showed left posterior quadrant PLEDS ( periodic lateralized epileptiform discharges).MRI of the brain revealed no acute infarction or stroke. Severe chronic small vessel ischemic disease with multiple chronic infarcts and chronic microhemorrhages were present. Neurology diagnosed seizure disorder due to recurrent and chronic CVAs.  The Keppra and Vimpat maintenance dosages were increased. Troponin of 1220 was attributed to demand ischemia; this dropped to 398. Creatinine had increased to 1.68 @ admission; baseline is felt to be 1.5.  Past medical and surgical history: Includes CKD stage IIIb, ICM, combined systolic/diastolic CHF, gout, and essential hypertension. No surgeries are listed.  Social history: noted as social alcohol & active smoking pre admission.  Family  history:limited history reviewed.   Review of systems:  Could not be completed due to dementia.  She had difficulty following commands.  She also exhibited great difficulty with  focus and some perseveration.  Physical exam:  Pertinent or positive findings: She appears suboptimally nourished.  She could not follow commands.  There seemed to be decreased range of motion of the right lower extremity.  General appearance: no acute distress, increased work of breathing is present.   Lymphatic: No lymphadenopathy about the head, neck, axilla. Eyes: No conjunctival inflammation or lid edema is present. There is no scleral icterus. Ears:  External ear exam shows no significant lesions or deformities.   Nose:  External nasal examination shows no deformity or inflammation. Nasal mucosa are pink and moist without lesions, exudates Oral exam: Lips and gums are healthy appearing.There is no oropharyngeal erythema or exudate. Neck:  No thyromegaly, masses, tenderness noted.    Heart:  No gallop, murmur, click, rub.  Lungs:  without wheezes, rhonchi, rales, rubs. Abdomen: Bowel sounds are normal.  Abdomen is soft and nontender with no organomegaly, hernias, masses. GU: Deferred  Extremities:  No cyanosis, clubbing, edema. Neurologic exam: Balance, Rhomberg, finger to nose testing could not be completed due to clinical state Skin: Warm & dry w/o tenting. No significant lesions or rash.  See clinical summary under each active problem in the Problem List with associated updated therapeutic plan

## 2020-01-04 ENCOUNTER — Inpatient Hospital Stay (HOSPITAL_COMMUNITY)
Admission: EM | Admit: 2020-01-04 | Discharge: 2020-01-10 | DRG: 480 | Disposition: A | Discharge status: Skilled Nursing Facility | Payer: Medicare HMO | Source: Skilled Nursing Facility | Attending: Internal Medicine | Admitting: Internal Medicine

## 2020-01-04 ENCOUNTER — Emergency Department (HOSPITAL_COMMUNITY): Payer: Medicare HMO

## 2020-01-04 ENCOUNTER — Other Ambulatory Visit: Payer: Self-pay

## 2020-01-04 ENCOUNTER — Encounter (HOSPITAL_COMMUNITY): Payer: Self-pay

## 2020-01-04 DIAGNOSIS — Z8673 Personal history of transient ischemic attack (TIA), and cerebral infarction without residual deficits: Secondary | ICD-10-CM | POA: Diagnosis not present

## 2020-01-04 DIAGNOSIS — M109 Gout, unspecified: Secondary | ICD-10-CM | POA: Diagnosis not present

## 2020-01-04 DIAGNOSIS — Z419 Encounter for procedure for purposes other than remedying health state, unspecified: Secondary | ICD-10-CM

## 2020-01-04 DIAGNOSIS — S72141A Displaced intertrochanteric fracture of right femur, initial encounter for closed fracture: Secondary | ICD-10-CM | POA: Diagnosis not present

## 2020-01-04 DIAGNOSIS — I2510 Atherosclerotic heart disease of native coronary artery without angina pectoris: Secondary | ICD-10-CM | POA: Diagnosis not present

## 2020-01-04 DIAGNOSIS — G309 Alzheimer's disease, unspecified: Secondary | ICD-10-CM | POA: Diagnosis not present

## 2020-01-04 DIAGNOSIS — M2550 Pain in unspecified joint: Secondary | ICD-10-CM | POA: Diagnosis not present

## 2020-01-04 DIAGNOSIS — M7989 Other specified soft tissue disorders: Secondary | ICD-10-CM | POA: Diagnosis not present

## 2020-01-04 DIAGNOSIS — G40909 Epilepsy, unspecified, not intractable, without status epilepticus: Secondary | ICD-10-CM | POA: Diagnosis not present

## 2020-01-04 DIAGNOSIS — E43 Unspecified severe protein-calorie malnutrition: Secondary | ICD-10-CM | POA: Diagnosis not present

## 2020-01-04 DIAGNOSIS — D649 Anemia, unspecified: Secondary | ICD-10-CM | POA: Diagnosis not present

## 2020-01-04 DIAGNOSIS — I10 Essential (primary) hypertension: Secondary | ICD-10-CM | POA: Diagnosis not present

## 2020-01-04 DIAGNOSIS — R252 Cramp and spasm: Secondary | ICD-10-CM | POA: Diagnosis not present

## 2020-01-04 DIAGNOSIS — S42294A Other nondisplaced fracture of upper end of right humerus, initial encounter for closed fracture: Secondary | ICD-10-CM | POA: Diagnosis not present

## 2020-01-04 DIAGNOSIS — N189 Chronic kidney disease, unspecified: Secondary | ICD-10-CM | POA: Diagnosis not present

## 2020-01-04 DIAGNOSIS — Z09 Encounter for follow-up examination after completed treatment for conditions other than malignant neoplasm: Secondary | ICD-10-CM

## 2020-01-04 DIAGNOSIS — W1830XA Fall on same level, unspecified, initial encounter: Secondary | ICD-10-CM | POA: Diagnosis present

## 2020-01-04 DIAGNOSIS — I428 Other cardiomyopathies: Secondary | ICD-10-CM | POA: Diagnosis present

## 2020-01-04 DIAGNOSIS — W19XXXA Unspecified fall, initial encounter: Secondary | ICD-10-CM | POA: Diagnosis not present

## 2020-01-04 DIAGNOSIS — M1A9XX0 Chronic gout, unspecified, without tophus (tophi): Secondary | ICD-10-CM | POA: Diagnosis not present

## 2020-01-04 DIAGNOSIS — I130 Hypertensive heart and chronic kidney disease with heart failure and stage 1 through stage 4 chronic kidney disease, or unspecified chronic kidney disease: Secondary | ICD-10-CM | POA: Diagnosis not present

## 2020-01-04 DIAGNOSIS — R079 Chest pain, unspecified: Secondary | ICD-10-CM | POA: Diagnosis not present

## 2020-01-04 DIAGNOSIS — Z20822 Contact with and (suspected) exposure to covid-19: Secondary | ICD-10-CM | POA: Diagnosis present

## 2020-01-04 DIAGNOSIS — F17210 Nicotine dependence, cigarettes, uncomplicated: Secondary | ICD-10-CM | POA: Diagnosis present

## 2020-01-04 DIAGNOSIS — S42309A Unspecified fracture of shaft of humerus, unspecified arm, initial encounter for closed fracture: Secondary | ICD-10-CM | POA: Diagnosis not present

## 2020-01-04 DIAGNOSIS — S72001A Fracture of unspecified part of neck of right femur, initial encounter for closed fracture: Secondary | ICD-10-CM | POA: Diagnosis not present

## 2020-01-04 DIAGNOSIS — I517 Cardiomegaly: Secondary | ICD-10-CM | POA: Diagnosis not present

## 2020-01-04 DIAGNOSIS — S42293A Other displaced fracture of upper end of unspecified humerus, initial encounter for closed fracture: Secondary | ICD-10-CM | POA: Insufficient documentation

## 2020-01-04 DIAGNOSIS — S42211A Unspecified displaced fracture of surgical neck of right humerus, initial encounter for closed fracture: Secondary | ICD-10-CM | POA: Diagnosis not present

## 2020-01-04 DIAGNOSIS — Z043 Encounter for examination and observation following other accident: Secondary | ICD-10-CM | POA: Diagnosis not present

## 2020-01-04 DIAGNOSIS — N289 Disorder of kidney and ureter, unspecified: Secondary | ICD-10-CM

## 2020-01-04 DIAGNOSIS — F0280 Dementia in other diseases classified elsewhere without behavioral disturbance: Secondary | ICD-10-CM | POA: Diagnosis present

## 2020-01-04 DIAGNOSIS — Y9301 Activity, walking, marching and hiking: Secondary | ICD-10-CM | POA: Diagnosis present

## 2020-01-04 DIAGNOSIS — F0390 Unspecified dementia without behavioral disturbance: Secondary | ICD-10-CM | POA: Diagnosis not present

## 2020-01-04 DIAGNOSIS — S42351A Displaced comminuted fracture of shaft of humerus, right arm, initial encounter for closed fracture: Secondary | ICD-10-CM | POA: Diagnosis not present

## 2020-01-04 DIAGNOSIS — M79605 Pain in left leg: Secondary | ICD-10-CM | POA: Diagnosis not present

## 2020-01-04 DIAGNOSIS — Z79899 Other long term (current) drug therapy: Secondary | ICD-10-CM | POA: Diagnosis not present

## 2020-01-04 DIAGNOSIS — Z7982 Long term (current) use of aspirin: Secondary | ICD-10-CM | POA: Diagnosis not present

## 2020-01-04 DIAGNOSIS — N1832 Chronic kidney disease, stage 3b: Secondary | ICD-10-CM | POA: Diagnosis present

## 2020-01-04 DIAGNOSIS — F05 Delirium due to known physiological condition: Secondary | ICD-10-CM | POA: Diagnosis not present

## 2020-01-04 DIAGNOSIS — M79601 Pain in right arm: Secondary | ICD-10-CM | POA: Diagnosis not present

## 2020-01-04 DIAGNOSIS — D62 Acute posthemorrhagic anemia: Secondary | ICD-10-CM | POA: Diagnosis not present

## 2020-01-04 DIAGNOSIS — S42291A Other displaced fracture of upper end of right humerus, initial encounter for closed fracture: Secondary | ICD-10-CM | POA: Diagnosis not present

## 2020-01-04 DIAGNOSIS — S42213A Unspecified displaced fracture of surgical neck of unspecified humerus, initial encounter for closed fracture: Secondary | ICD-10-CM

## 2020-01-04 DIAGNOSIS — R600 Localized edema: Secondary | ICD-10-CM | POA: Diagnosis not present

## 2020-01-04 DIAGNOSIS — M25511 Pain in right shoulder: Secondary | ICD-10-CM | POA: Diagnosis not present

## 2020-01-04 DIAGNOSIS — N179 Acute kidney failure, unspecified: Secondary | ICD-10-CM | POA: Diagnosis not present

## 2020-01-04 DIAGNOSIS — S72144A Nondisplaced intertrochanteric fracture of right femur, initial encounter for closed fracture: Secondary | ICD-10-CM

## 2020-01-04 DIAGNOSIS — Z7401 Bed confinement status: Secondary | ICD-10-CM | POA: Diagnosis not present

## 2020-01-04 DIAGNOSIS — S42201A Unspecified fracture of upper end of right humerus, initial encounter for closed fracture: Secondary | ICD-10-CM | POA: Diagnosis not present

## 2020-01-04 DIAGNOSIS — S72101A Unspecified trochanteric fracture of right femur, initial encounter for closed fracture: Secondary | ICD-10-CM | POA: Diagnosis not present

## 2020-01-04 DIAGNOSIS — R4182 Altered mental status, unspecified: Secondary | ICD-10-CM | POA: Diagnosis not present

## 2020-01-04 DIAGNOSIS — D631 Anemia in chronic kidney disease: Secondary | ICD-10-CM | POA: Diagnosis not present

## 2020-01-04 DIAGNOSIS — Z681 Body mass index (BMI) 19 or less, adult: Secondary | ICD-10-CM

## 2020-01-04 DIAGNOSIS — I5042 Chronic combined systolic (congestive) and diastolic (congestive) heart failure: Secondary | ICD-10-CM | POA: Diagnosis present

## 2020-01-04 DIAGNOSIS — R9431 Abnormal electrocardiogram [ECG] [EKG]: Secondary | ICD-10-CM | POA: Diagnosis not present

## 2020-01-04 DIAGNOSIS — M25551 Pain in right hip: Secondary | ICD-10-CM | POA: Diagnosis not present

## 2020-01-04 DIAGNOSIS — R52 Pain, unspecified: Secondary | ICD-10-CM | POA: Diagnosis not present

## 2020-01-04 DIAGNOSIS — N183 Chronic kidney disease, stage 3 unspecified: Secondary | ICD-10-CM | POA: Diagnosis not present

## 2020-01-04 DIAGNOSIS — I509 Heart failure, unspecified: Secondary | ICD-10-CM | POA: Diagnosis not present

## 2020-01-04 LAB — COMPREHENSIVE METABOLIC PANEL
ALT: 27 U/L (ref 0–44)
AST: 28 U/L (ref 15–41)
Albumin: 4.6 g/dL (ref 3.5–5.0)
Alkaline Phosphatase: 57 U/L (ref 38–126)
Anion gap: 13 (ref 5–15)
BUN: 31 mg/dL — ABNORMAL HIGH (ref 8–23)
CO2: 22 mmol/L (ref 22–32)
Calcium: 11 mg/dL — ABNORMAL HIGH (ref 8.9–10.3)
Chloride: 106 mmol/L (ref 98–111)
Creatinine, Ser: 1.71 mg/dL — ABNORMAL HIGH (ref 0.44–1.00)
GFR, Estimated: 30 mL/min — ABNORMAL LOW (ref >60)
Glucose, Bld: 134 mg/dL — ABNORMAL HIGH (ref 70–99)
Potassium: 4.7 mmol/L (ref 3.5–5.1)
Sodium: 141 mmol/L (ref 135–145)
Total Bilirubin: 0.7 mg/dL (ref 0.3–1.2)
Total Protein: 8.3 g/dL — ABNORMAL HIGH (ref 6.5–8.1)

## 2020-01-04 LAB — RESP PANEL BY RT-PCR (FLU A&B, COVID) ARPGX2
Influenza A by PCR: NEGATIVE
Influenza B by PCR: NEGATIVE
SARS Coronavirus 2 by RT PCR: NEGATIVE

## 2020-01-04 LAB — URINALYSIS, ROUTINE W REFLEX MICROSCOPIC
Bacteria, UA: NONE SEEN
Bilirubin Urine: NEGATIVE
Glucose, UA: NEGATIVE mg/dL
Ketones, ur: NEGATIVE mg/dL
Nitrite: NEGATIVE
Protein, ur: 100 mg/dL — AB
Specific Gravity, Urine: 1.013 (ref 1.005–1.030)
pH: 5 (ref 5.0–8.0)

## 2020-01-04 LAB — CBC WITH DIFFERENTIAL/PLATELET
Abs Immature Granulocytes: 0.06 10*3/uL (ref 0.00–0.07)
Basophils Absolute: 0 10*3/uL (ref 0.0–0.1)
Basophils Relative: 0 %
Eosinophils Absolute: 0.1 10*3/uL (ref 0.0–0.5)
Eosinophils Relative: 0 %
HCT: 35.6 % — ABNORMAL LOW (ref 36.0–46.0)
Hemoglobin: 11.6 g/dL — ABNORMAL LOW (ref 12.0–15.0)
Immature Granulocytes: 0 %
Lymphocytes Relative: 7 %
Lymphs Abs: 0.9 10*3/uL (ref 0.7–4.0)
MCH: 32.8 pg (ref 26.0–34.0)
MCHC: 32.6 g/dL (ref 30.0–36.0)
MCV: 100.6 fL — ABNORMAL HIGH (ref 80.0–100.0)
Monocytes Absolute: 0.8 10*3/uL (ref 0.1–1.0)
Monocytes Relative: 6 %
Neutro Abs: 11.6 10*3/uL — ABNORMAL HIGH (ref 1.7–7.7)
Neutrophils Relative %: 87 %
Platelets: 306 10*3/uL (ref 150–400)
RBC: 3.54 MIL/uL — ABNORMAL LOW (ref 3.87–5.11)
RDW: 17.2 % — ABNORMAL HIGH (ref 11.5–15.5)
WBC: 13.5 10*3/uL — ABNORMAL HIGH (ref 4.0–10.5)
nRBC: 0 % (ref 0.0–0.2)

## 2020-01-04 LAB — APTT: aPTT: 21 seconds — ABNORMAL LOW (ref 24–36)

## 2020-01-04 LAB — AMMONIA: Ammonia: 15 umol/L (ref 9–35)

## 2020-01-04 LAB — PROTIME-INR
INR: 1 (ref 0.8–1.2)
Prothrombin Time: 12.4 seconds (ref 11.4–15.2)

## 2020-01-04 LAB — CBG MONITORING, ED: Glucose-Capillary: 122 mg/dL — ABNORMAL HIGH (ref 70–99)

## 2020-01-04 MED ORDER — POLYETHYLENE GLYCOL 3350 17 G PO PACK: 17.0000 g | PACK | Freq: Every day | ORAL | Status: DC | PRN

## 2020-01-04 MED ORDER — AMLODIPINE BESYLATE 10 MG PO TABS
10.0000 mg | ORAL_TABLET | Freq: Every day | ORAL | Status: DC
  Administered 2020-01-06 – 2020-01-09 (×4): 10 mg via ORAL
  Filled 2020-01-04: qty 1
  Filled 2020-01-04: qty 2
  Filled 2020-01-04 (×3): qty 1

## 2020-01-04 MED ORDER — CALCIUM CARBONATE 1250 (500 CA) MG PO TABS
1250.0000 mg | ORAL_TABLET | Freq: Every evening | ORAL | Status: DC
  Administered 2020-01-06 – 2020-01-09 (×4): 1250 mg via ORAL
  Filled 2020-01-04 (×4): qty 1

## 2020-01-04 MED ORDER — FERROUS SULFATE 325 (65 FE) MG PO TABS
325.0000 mg | ORAL_TABLET | Freq: Every morning | ORAL | Status: DC
  Administered 2020-01-06 – 2020-01-09 (×4): 325 mg via ORAL
  Filled 2020-01-04 (×5): qty 1

## 2020-01-04 MED ORDER — LEVETIRACETAM 500 MG PO TABS
1000.0000 mg | ORAL_TABLET | Freq: Two times a day (BID) | ORAL | Status: DC
  Administered 2020-01-04 – 2020-01-09 (×10): 1000 mg via ORAL
  Filled 2020-01-04 (×11): qty 2

## 2020-01-04 MED ORDER — LACOSAMIDE 50 MG PO TABS
200.0000 mg | ORAL_TABLET | Freq: Two times a day (BID) | ORAL | Status: DC
  Administered 2020-01-04 – 2020-01-09 (×10): 200 mg via ORAL
  Filled 2020-01-04 (×10): qty 4

## 2020-01-04 MED ORDER — FENTANYL CITRATE (PF) 100 MCG/2ML IJ SOLN
25.0000 ug | Freq: Once | INTRAMUSCULAR | Status: AC
  Administered 2020-01-04: 25 ug via INTRAVENOUS
  Filled 2020-01-04: qty 2

## 2020-01-04 MED ORDER — HYDROCODONE-ACETAMINOPHEN 5-325 MG PO TABS: 1.0000 | ORAL_TABLET | Freq: Four times a day (QID) | ORAL | Status: DC | PRN

## 2020-01-04 MED ORDER — CARVEDILOL 12.5 MG PO TABS
12.5000 mg | ORAL_TABLET | Freq: Two times a day (BID) | ORAL | Status: DC
  Administered 2020-01-06 – 2020-01-09 (×8): 12.5 mg via ORAL
  Filled 2020-01-04 (×9): qty 1

## 2020-01-04 MED ORDER — SODIUM CHLORIDE 0.9 % IV BOLUS
250.0000 mL | Freq: Once | INTRAVENOUS | Status: AC
  Administered 2020-01-04: 250 mL via INTRAVENOUS

## 2020-01-04 MED ORDER — SODIUM CHLORIDE 0.9 % IV SOLN: INTRAVENOUS | Status: DC

## 2020-01-04 MED ORDER — HYDROMORPHONE HCL 1 MG/ML IJ SOLN: 0.5000 mg | INTRAMUSCULAR | Status: DC | PRN

## 2020-01-04 MED ORDER — DONEPEZIL HCL 5 MG PO TABS
5.0000 mg | ORAL_TABLET | Freq: Every day | ORAL | Status: DC
  Administered 2020-01-04 – 2020-01-08 (×5): 5 mg via ORAL
  Filled 2020-01-04 (×5): qty 1

## 2020-01-04 MED ORDER — ALLOPURINOL 100 MG PO TABS
50.0000 mg | ORAL_TABLET | Freq: Every day | ORAL | Status: DC
  Administered 2020-01-06 – 2020-01-09 (×4): 50 mg via ORAL
  Filled 2020-01-04: qty 0.5
  Filled 2020-01-04 (×4): qty 1

## 2020-01-04 MED ORDER — ASPIRIN 325 MG PO TABS
325.0000 mg | ORAL_TABLET | Freq: Every day | ORAL | Status: DC
  Filled 2020-01-04: qty 1

## 2020-01-04 NOTE — ED Notes (Signed)
Patient transported to CT 

## 2020-01-04 NOTE — Consult Note (Signed)
ORTHOPAEDIC CONSULTATION  REQUESTING PHYSICIAN: [place], Greggory Brandy, MD  Chief Complaint: Right femur and hip fracture  HPI: [name] is a 79 y.o. female with recent discharge 2 to 3 days ago to a nursing facility after seizure in setting dementia and multiple medical comorbidities.  Patient unfortunately has been stubborn per her daughter Phineas Real who we obtain a history from the phone and has likely not been ambulating with assistive devices and was found after a fall today.  She endorses pain in her hip and shoulder on the right.   History obtained on the phone from [place] who along with her brother share healthcare power of attorney roles.   Past Medical History:  Diagnosis Date  . Abdominal pain 02/24/2013  . Acute combined systolic and diastolic congestive heart failure ([place]) 02/24/2013  . Acute on chronic renal insufficiency 05/15/2015  . Acute renal failure ([place]) 12/28/2012  . Acute renal failure superimposed on stage 3 chronic kidney disease ([place])   . Anemia 05/20/2015  . 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 Basal ganglia infarction ([place]) 01/08/2013  . CAP (community acquired pneumonia)   . Chest pain 05/14/2015  . Chronic combined systolic and diastolic CHF (congestive heart failure) ([place]) 03/06/2014  . Congestive heart failure (CHF) ([place])   . Constipation 02/24/2013  . Cough   . Diastolic dysfunction-grade 3 05/20/2015  . Dyspnea 02/24/2013  . Encephalopathy 12/28/2012  . Epilepsy ([place])   . Generalized seizure ([place]) 12/28/2012  . H/O: CVA (cerebrovascular accident) 01/04/2013  . Hyperammonemia ([place]) 05/17/2015  . Hyperglycemia 02/24/2013  . Hyperkalemia 02/11/2013  . Hypertension   . 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 Hyponatremia 02/11/2013  . Influenza A 02/11/2013  . Liver enzyme elevation 02/11/2013  . Metabolic acidosis 53/20/2334  . Metabolic encephalopathy 3/56/8616  . NICM (nonischemic cardiomyopathy) -EF 25% 05/20/2015   . Pleuritic chest pain 02/24/2013  . PNA (pneumonia) 05/20/2015  . Pneumobilia 02/12/2013  . Pneumonia   . Seizure ([place]) 12/28/2012  . Seizures ([place])   . Smoker   . Stroke ([place])   . Troponin level elevated 05/14/2015  . Volume overload 02/24/2013  . Weight gain 02/24/2013   Past Surgical History:  Procedure Laterality Date  . NO PAST SURGERIES     Social History   Socioeconomic History  . Marital status: Single    Spouse name: Not on file  . Number of children: 3  . Years of education: 31  . Highest education level: Not on file  Occupational History    Comment: Retired  Tobacco Use  . Smoking status: Current Some Day Smoker    Packs/day: 0.25    Types: Cigarettes  . Smokeless tobacco: Never Used  . Tobacco comment: 1/2 caigarettes a day  Substance and Sexual Activity  . Alcohol use: Yes    Alcohol/week: 0.0 standard drinks    Comment: occasional  . Drug use: No  . Sexual activity: Never  Other Topics Concern  . Not on file  Social History Narrative   Patient lives at home alone.    Retired.   Education one year of college.   Left handed.   Caffeine one cup of coffee daily and one cup of tea.   Social Determinants of Health   Financial Resource Strain:   . Difficulty of Paying Living Expenses: Not on file  Food Insecurity:   . Worried About Charity fundraiser in the Last Year: Not on file  .  Ran Out of Food in the Last Year: Not on file  Transportation Needs:   . Lack of Transportation (Medical): Not on file  . Lack of Transportation (Non-Medical): Not on file  Physical Activity:   . Days of Exercise per Week: Not on file  . Minutes of Exercise per Session: Not on file  Stress:   . Feeling of Stress : Not on file  Social Connections:   . Frequency of Communication with Friends and Family: Not on file  . Frequency of Social Gatherings with Friends and Family: Not on file  . Attends Religious Services: Not on file  . Active Member of Clubs or Organizations:  Not on file  . Attends Archivist Meetings: Not on file  . Marital Status: Not on file   Family History  Problem Relation Age of Onset  . Cancer Father   . Anemia Daughter    No Known Allergies Prior to Admission medications   Medication Sig Start Date End Date Taking? Authorizing Provider  allopurinol (ZYLOPRIM) 100 MG tablet Take 50 mg by mouth daily.     [provider]  amLODipine (NORVASC) 10 MG tablet Take 10 mg by mouth daily. 07/19/19   [provider]  aspirin 325 MG tablet Take 1 tablet (325 mg total) by mouth daily. 01/03/20   Dwyane Dee, MD  calcium carbonate (OSCAL) 1500 (600 Ca) MG TABS tablet Take 1,500 mg by mouth every evening.  07/26/19   [provider]  carvedilol (COREG) 12.5 MG tablet Take 12.5 mg by mouth 2 (two) times daily with a meal.    [provider]  donepezil (ARICEPT) 5 MG tablet Take 5 mg by mouth at bedtime. 07/02/19   [provider]  FEROSUL 325 (65 Fe) MG tablet Take 325 mg by mouth every morning. 07/26/19   [provider]  lacosamide (VIMPAT) 200 MG TABS tablet Take 1 tablet (200 mg total) by mouth 2 (two) times daily. 01/02/20   Dwyane Dee, MD  levETIRAcetam (KEPPRA) 1000 MG tablet Take 1 tablet (1,000 mg total) by mouth 2 (two) times daily. 01/02/20   Dwyane Dee, MD  losartan (COZAAR) 50 MG tablet Take 1 tablet (50 mg total) by mouth every morning. 01/03/20   Dwyane Dee, MD   DG Chest 2 View  Result Date: 01/04/2020 CLINICAL DATA:  Fall, right hip pain EXAM: CHEST - 2 VIEW COMPARISON:  Radiograph 12/27/2019, 11/17/2017, CT 04/16/2012 FINDINGS: Mild cardiomegaly appearance with marked tortuosity of the brachiocephalic vasculature and a dilated and calcified tortuous aorta with some resulting rightward tracheal deviation which is overall similar appearance to prior configuration. No acute cardiomediastinal contour abnormalities are seen. No dense apical capping or other features to  suggest an acute intra mediastinal process. Some chronic hyperinflation and coarsened interstitial changes are present in the lungs. No consolidation, features of edema, pneumothorax, or effusion. Interval progression of the midthoracic compression deformity is overall age indeterminate. Questionable deformity of the right radius and ulna though possibly projectional as these are incidentally included on the lateral radiograph. IMPRESSION: 1. No acute cardiopulmonary abnormality. 2. Chronic tortuosity and dilatation of aorta with rightward tracheal deviation. 3. Interval progression of a previously seen midthoracic compression deformity is overall age indeterminate. Correlate with point tenderness. 4. Questionable deformity of the radius and ulna though possibly projectional as these are incidentally included on the lateral radiograph. Laterality uncertain. Correlate with clinical symptoms and consider dedicated views as warranted. Electronically Signed   By: Elwin Sleight.D.  On: 01/04/2020 17:00   DG Pelvis 1-2 Views  Result Date: 01/04/2020 CLINICAL DATA:  Fall EXAM: PELVIS - 1-2 VIEW COMPARISON:  CT 04/16/2012 FINDINGS: Comminuted, mildly displaced and impacted inter trochanteric femur fracture. Additional suspicious lucency noted in the right femoral head neck junction as well could reflect an additional fracture line. Femoral head remains normally located. Left femur is intact. Remaining bones of the pelvis are intact and congruent though portions of the sacrum are obscured by overlying bowel gas. Transitional lumbosacral vertebrae noted incidentally. Right lateral hip swelling, possible hematoma and probable hip effusion. IMPRESSION: 1. Comminuted, mildly displaced and impacted intertrochanteric right femur fracture. 2. Additional suspicious lucency in the right femoral head neck junction could reflect an additional subcapital right femoral fracture line. 3. Right lateral hip swelling, possible hematoma  and probable hip effusion. Electronically Signed   By: Lovena Le M.D.   On: 01/04/2020 16:55   DG Shoulder Right  Result Date: 01/04/2020 CLINICAL DATA:  Pain after fall. EXAM: RIGHT SHOULDER - 2+ VIEW COMPARISON:  None. FINDINGS: There is a comminuted fracture of the humeral head and neck with mild displacement. No humeral head dislocation. No other acute abnormalities. IMPRESSION: Comminuted fracture of the humeral head neck with mild displacement. Electronically Signed   By: Dorise Bullion III M.D   On: 01/04/2020 12:12   DG Forearm Right  Result Date: 01/04/2020 CLINICAL DATA:  Fall.  Right arm pain. EXAM: RIGHT FOREARM - 2 VIEW COMPARISON:  None. FINDINGS: No fracture or bone lesion. Elbow and wrist joints are normally spaced and aligned. Soft tissues are unremarkable. IMPRESSION: Negative. Electronically Signed   By: Lajean Manes M.D.   On: 01/04/2020 18:30   CT Head Wo Contrast  Result Date: 01/04/2020 CLINICAL DATA:  Ground level fall EXAM: CT HEAD WITHOUT CONTRAST TECHNIQUE: Contiguous axial images were obtained from the base of the skull through the vertex without intravenous contrast. COMPARISON:  12/27/2019 FINDINGS: Brain: Confluent hypodensities throughout the periventricular and subcortical white matter consistent with chronic small vessel ischemic changes, stable. No signs of acute infarct or hemorrhage. Lateral ventricles and midline structures are otherwise unremarkable. No acute extra-axial fluid collections. No mass effect. Vascular: No hyperdense vessel or unexpected calcification. Skull: Normal. Negative for fracture or focal lesion. Sinuses/Orbits: Mild chronic mucosal thickening left sphenoid sinus. Remaining sinuses are clear. Other: None. IMPRESSION: 1. Stable chronic small vessel ischemic changes. No acute intracranial process. Electronically Signed   By: Randa Ngo M.D.   On: 01/04/2020 17:17   CT Cervical Spine Wo Contrast  Result Date: 01/04/2020 CLINICAL DATA:   Ground level fall, right arm pain EXAM: CT CERVICAL SPINE WITHOUT CONTRAST TECHNIQUE: Multidetector CT imaging of the cervical spine was performed without intravenous contrast. Multiplanar CT image reconstructions were also generated. COMPARISON:  None. FINDINGS: Alignment: Alignment is anatomic. Skull base and vertebrae: No acute fracture. No primary bone lesion or focal pathologic process. Soft tissues and spinal canal: No prevertebral fluid or swelling. No visible canal hematoma. Disc levels: Mild multilevel spondylosis greatest at C2-3, C4-5, and C5-6. Upper chest: Airway is patent.  Lung apices are clear. Other: Reconstructed images demonstrate no additional findings. IMPRESSION: 1. Mild cervical spondylosis.  No acute fractures. Electronically Signed   By: Randa Ngo M.D.   On: 01/04/2020 17:19   DG Humerus Right  Result Date: 01/04/2020 CLINICAL DATA:  Fall.  Right shoulder pain. EXAM: RIGHT HUMERUS - 2+ VIEW COMPARISON:  None. FINDINGS: Comminuted fracture of the proximal humerus. There is a  transverse fracture across the metaphysis with a secondary fracture across the base of the greater tuberosity. Greater tuberosity component is displaced laterally by 5 mm. No other fracture displacement. Glenohumeral and elbow joints are normally aligned as is the [facility] joint. Soft tissue swelling about the shoulder and proximal arm. IMPRESSION: 1. Mildly comminuted fracture of the proximal humerus, with mild lateral displacement of the greater tuberosity component. No other fractures and no dislocation. Electronically Signed   By: Lajean Manes M.D.   On: 01/04/2020 18:29   DG Femur Min 2 Views Left  Result Date: 01/04/2020 CLINICAL DATA:  [place] from ground level.  Leg pain. EXAM: LEFT FEMUR 2 VIEWS COMPARISON:  06/28/2013 FINDINGS: No fracture or bone lesion. Hip and knee joints are normally spaced and aligned. There scattered femoral artery vascular calcifications. Soft tissues otherwise unremarkable.  IMPRESSION: No fracture or dislocation. Electronically Signed   By: Lajean Manes M.D.   On: 01/04/2020 18:28   DG Femur Min 2 Views Right  Result Date: 01/04/2020 CLINICAL DATA:  Ground level fall.  Right knee/leg pain. EXAM: RIGHT FEMUR 2 VIEWS COMPARISON:  None. FINDINGS: Non comminuted intertrochanteric fracture of the proximal right femur, with no significant displacement, but with mild varus angulation. No other fractures. Hip and knee joints are normally aligned. Skeletal structures are demineralized. IMPRESSION: 1. Non comminuted, nondisplaced intertrochanteric fracture of the proximal right femur with mild varus angulation. No dislocation. Electronically Signed   By: Lajean Manes M.D.   On: 01/04/2020 18:27   Family History Reviewed and non-contributory, no pertinent history of problems with bleeding or anesthesia      Review of Systems Deferred due to telehealth   OBJECTIVE  Vitals: Patient Vitals for the past 8 hrs:  BP Pulse Resp SpO2  01/04/20 1851 (!) 189/97 (!) 104 19 95 %  01/04/20 1745 (!) 178/99 (!) 102 20 98 %  01/04/20 1730 (!) 163/98 (!) 103 15 94 %  01/04/20 1630 (!) 172/97 95 20 99 %  01/04/20 1615 (!) 176/94 90 19 93 %  01/04/20 1600 (!) 174/95 87 19 92 %  01/04/20 1545 (!) 176/90 88 16 95 %  01/04/20 1544 (!) 168/94 81 18 93 %  01/04/20 1330 132/85 85 20 94 %   Defered due telehealth    Test Results Imaging X-ray of the right humerus demonstrate a likely 2 part proximal humerus fracture.  X-ray of the right hip demonstrates a displaced intertrochanteric femur fracture on the right.  Labs cbc Recent Labs    01/04/20 1754  WBC 13.5*  HGB 11.6*  HCT 35.6*  PLT 306    Labs inflam No results for input(s): CRP in the last 72 hours.  Invalid input(s): ESR  Labs coag Recent Labs    01/04/20 1754  INR 1.0    Recent Labs    01/02/20 0453 01/04/20 1754  NA 141 141  K 4.2 4.7  CL 107 106  CO2 20* 22  GLUCOSE 94 134*  BUN 19 31*   CREATININE 1.38* 1.71*  CALCIUM 9.6 11.0*     ASSESSMENT AND PLAN: 79 y.o. female with the following: Right intertrochanteric femur fracture and right proximal humerus fracture in a patient with dementia, seizure disorder and multiple other medical comorbidities.   Orthopedics recommends admission to a medical service and we will provide consultation and follow along  History and consent obtained via daughter Phineas Real at [phone].  We discussed the case at length with her and she has a complex situation.  She had just been discharged but is ambulatory at baseline and we do think that surgery would be in her best interest in the setting of multitrauma.  We will plan to do her femur fracture tomorrow pending medical doctor evaluation for optimization.  We have tentatively posted for midmorning tomorrow as the patient had recently been discharged and was under care until 2 days ago.  Discussed the nature of the injury as well as the care with the patient as well as the family.  Nonoperative measures are not well tolerated as patient's on bedrest for extended periods of time tend to develop secondary issues such as pneumonia, urinary tract infections, bedsores and delirium.  Based on this our recommendation is for operative measures.  The risks benefits and alternatives were discussed with the patient including but not limited to the risks of nonoperative treatment, versus surgical intervention including infection, bleeding, nerve injury, periprosthetic fracture, the need for revision surgery, leg length discrepancy, gait change, blood clots, cardiopulmonary complications, morbidity, mortality, among others, and they were willing to proceed.    We discussed that her shoulder may require further surgery however due to positioning and length of surgery we do not think that is in the best interest for the patient to proceed with this tomorrow necessarily.  Will likely deal with the hip tomorrow and may  come back at a later date this week to deal with the shoulder if it is at all indicated.  CT of the shoulder is pending.  - Weight Bearing Status/Activity: Nonweightbearing right upper extremity right lower extremity currently, postoperatively will be weightbearing to tolerance in the right lower extremity.  - Additional recommended labs/tests: none required from orthopedics  -VTE Prophylaxis: Hold for now  - Pain control: As needed  - Follow-up plan: TBD  -Procedures: Hip intramedullary nail on right planned for tomorrow

## 2020-01-04 NOTE — ED Notes (Signed)
daugther phone number: [phone]

## 2020-01-04 NOTE — H&P (Addendum)
History and Physical   [name] VQM:[id number] DOB: 1940/04/22 DOA: 01/04/2020  PCP: [name], MD   Patient coming from: [facility] skilled nursing facility  Chief Complaint: Fall  HPI: [name] is a 79 y.o. female with medical history significant of heart failure, CKD 3, Alzheimer's dementia, CVA, seizure, gout, hypertension, malnutrition who presents from her nursing facility following a witnessed mechanical fall.  Due to patient's dementia history obtained with the assistance of her daughter and chart review.  Daughter who states patient was doing well at 8 PM yesterday and per facility she had a witnessed mechanical fall at the facility, no one has been able to get in touch with facility staff. She fell on her right side. Patient states that she remembers she was walking down the hall prior to her fall and does not remember tripping on anything in particular. Daughter notes that patient is supposed to be a fall risk and was reportedly not walking with a walker or assistance. Patient states her pain is bad as a 10 out of 10 now that she has been receiving pain medication her pain has been coming and going. It is better if she is does not move and is painful with movement. She denies fever, cough, chest pain, shortness of breath, abdominal pain, nausea.  Of note, patient recently admitted for seizures and encephalopathy from 11/26-12/2, she was discharged to her facility for only couple of days before having this fall.  ED Course: Vital signs in ED significant for BP in the 140s to 180 systolic. Lab work-up showed BMP with creatinine of 1.7 from a baseline of 1.4, LFTs with calcium of 8.3, CBC with mild leukocytosis 13 and hemoglobin of 11.6 which is stable. PT and INR within normal limits, PTT little bit low at 21. Ammonia pending, UA pending, respiratory panel for flu and Covid pending. Imaging included chest x-ray which showed no acute cardiopulmonary disease.  Shoulder x-ray, forearm x-ray and humerus x-ray on the right which showed a right shoulder fracture. Pelvis x-ray right femur x-ray and left femur x-ray showed a right intertrochanteric fracture with possible additional fractures on right, normal left femur x-ray. CT head showed no acute changes, CT C-spine showed no acute changes. CT of the right shoulder is pending. Orthopedics was consulted and will see the patient tomorrow.  Review of Systems: As per HPI otherwise all other systems reviewed and are negative.  Past Medical History:  Diagnosis Date  . Abdominal pain 02/24/2013  . Acute combined systolic and diastolic congestive heart failure (HCC) 02/24/2013  . Acute on chronic renal insufficiency 05/15/2015  . Acute renal failure (HCC) 12/28/2012  . Acute renal failure superimposed on stage 3 chronic kidney disease (HCC)   . Anemia 05/20/2015  . 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 Basal ganglia infarction (HCC) 01/08/2013  . CAP (community acquired pneumonia)   . Chest pain 05/14/2015  . Chronic combined systolic and diastolic CHF (congestive heart failure) (HCC) 03/06/2014  . Congestive heart failure (CHF) (HCC)   . Constipation 02/24/2013  . Cough   . Diastolic dysfunction-grade 3 05/20/2015  . Dyspnea 02/24/2013  . Encephalopathy 12/28/2012  . Epilepsy (HCC)   . Generalized seizure (HCC) 12/28/2012  . H/O: CVA (cerebrovascular accident) 01/04/2013  . Hyperammonemia (HCC) 05/17/2015  . Hyperglycemia 02/24/2013  . Hyperkalemia 02/11/2013  . Hypertension   . 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 Hyponatremia 02/11/2013  . Influenza A 02/11/2013  . Liver enzyme elevation 02/11/2013  .  Metabolic acidosis 12/28/2012  . Metabolic encephalopathy 05/16/2015  . NICM (nonischemic cardiomyopathy) -EF 25% 05/20/2015  . Pleuritic chest pain 02/24/2013  . PNA (pneumonia) 05/20/2015  . Pneumobilia 02/12/2013  . Pneumonia   . Seizure (HCC) 12/28/2012  . Seizures (HCC)    . Smoker   . Stroke (HCC)   . Troponin level elevated 05/14/2015  . Volume overload 02/24/2013  . Weight gain 02/24/2013    Past Surgical History:  Procedure Laterality Date  . NO PAST SURGERIES      Social History  reports that she has been smoking cigarettes. She has been smoking about 0.25 packs per day. She has never used smokeless tobacco. She reports current alcohol use. She reports that she does not use drugs.  No Known Allergies  Family History  Problem Relation Age of Onset  . Cancer Father   . Anemia Daughter   Reviewed on admission  Prior to Admission medications   Medication Sig Start Date End Date Taking? Authorizing Provider  allopurinol (ZYLOPRIM) 100 MG tablet Take 50 mg by mouth daily.     [provider]  amLODipine (NORVASC) 10 MG tablet Take 10 mg by mouth daily. 07/19/19   [provider]  aspirin 325 MG tablet Take 1 tablet (325 mg total) by mouth daily. 01/03/20   Lewie Chamber, MD  calcium carbonate (OSCAL) 1500 (600 Ca) MG TABS tablet Take 1,500 mg by mouth every evening.  07/26/19   [provider]  carvedilol (COREG) 12.5 MG tablet Take 12.5 mg by mouth 2 (two) times daily with a meal.    [provider]  donepezil (ARICEPT) 5 MG tablet Take 5 mg by mouth at bedtime. 07/02/19   [provider]  FEROSUL 325 (65 Fe) MG tablet Take 325 mg by mouth every morning. 07/26/19   [provider]  lacosamide (VIMPAT) 200 MG TABS tablet Take 1 tablet (200 mg total) by mouth 2 (two) times daily. 01/02/20   Lewie Chamber, MD  levETIRAcetam (KEPPRA) 1000 MG tablet Take 1 tablet (1,000 mg total) by mouth 2 (two) times daily. 01/02/20   Lewie Chamber, MD  losartan (COZAAR) 50 MG tablet Take 1 tablet (50 mg total) by mouth every morning. 01/03/20   Lewie Chamber, MD    Physical Exam: Vitals:   01/04/20 1930 01/04/20 1945 01/04/20 2045 01/04/20 2100  BP: (!) 184/91 (!) 181/97 (!) 176/97 (!) 174/148  Pulse: 99 98 95 (!)  111  Resp: 18 18 18  (!) 21  Temp:      SpO2: 94% 98% 99% 100%   Physical Exam Constitutional:      General: She is not in acute distress.    Comments: Thin elderly female  HENT:     Head: Normocephalic and atraumatic.     Mouth/Throat:     Mouth: Mucous membranes are moist.     Pharynx: Oropharynx is clear.  Eyes:     Extraocular Movements: Extraocular movements intact.     Pupils: Pupils are equal, round, and reactive to light.  Cardiovascular:     Rate and Rhythm: Normal rate and regular rhythm.     Pulses: Normal pulses.     Heart sounds: Normal heart sounds.  Pulmonary:     Effort: Pulmonary effort is normal. No respiratory distress.     Breath sounds: Normal breath sounds.  Abdominal:     General: Bowel sounds are normal. There is no distension.     Palpations: Abdomen is soft.  Tenderness: There is no abdominal tenderness.  Musculoskeletal:        General: No swelling or deformity.     Comments: Bilateral lower extremities are neurovascularly intact  Skin:    General: Skin is warm and dry.  Neurological:     General: No focal deficit present.     Comments: Alert and oriented to self only not to place or year. Does remember her fall.     Labs on Admission: I have personally reviewed following labs and imaging studies  CBC: Recent Labs  Lab 12/29/19 0218 01/04/20 1754  WBC 8.9 13.5*  NEUTROABS  --  11.6*  HGB 10.7* 11.6*  HCT 33.0* 35.6*  MCV 97.9 100.6*  PLT 236 306    Basic Metabolic Panel: Recent Labs  Lab 12/29/19 0218 12/29/19 0218 12/30/19 0428 12/31/19 0412 01/01/20 0307 01/02/20 0453 01/04/20 1754  NA 139   < > 141 142 142 141 141  K 4.0   < > 3.7 3.6 4.0 4.2 4.7  CL 109   < > 110 109 112* 107 106  CO2 19*   < > 19* 20* 18* 20* 22  GLUCOSE 104*   < > 111* 104* 107* 94 134*  BUN 24*   < > 31*  CREATININE 1.45*   < > 1.42* 1.39* 1.59* 1.38* 1.71*  CALCIUM 9.3   < > 9.2 9.2 9.3 9.6 11.0*  MG 1.9  --  1.9 1.7  --   --   --     < > = values in this interval not displayed.    GFR: Estimated Creatinine Clearance: 15.8 mL/min (A) (by C-G formula based on SCr of 1.71 mg/dL (H)).  Liver Function Tests: Recent Labs  Lab 01/04/20 1754  AST 28  ALT 27  ALKPHOS 57  BILITOT 0.7  PROT 8.3*  ALBUMIN 4.6    Urine analysis:    Component Value Date/Time   COLORURINE STRAW (A) 12/27/2019 1707   APPEARANCEUR CLEAR 12/27/2019 1707   LABSPEC 1.010 12/27/2019 1707   PHURINE 6.0 12/27/2019 1707   GLUCOSEU NEGATIVE 12/27/2019 1707   HGBUR SMALL (A) 12/27/2019 1707   BILIRUBINUR NEGATIVE 12/27/2019 1707   KETONESUR NEGATIVE 12/27/2019 1707   PROTEINUR 100 (A) 12/27/2019 1707   UROBILINOGEN 0.2 03/05/2014 1324   NITRITE NEGATIVE 12/27/2019 1707   LEUKOCYTESUR NEGATIVE 12/27/2019 1707    Radiological Exams on Admission: DG Chest 2 View  Result Date: 01/04/2020 CLINICAL DATA:  Fall, right hip pain EXAM: CHEST - 2 VIEW COMPARISON:  Radiograph 12/27/2019, 11/17/2017, CT 04/16/2012 FINDINGS: Mild cardiomegaly appearance with marked tortuosity of the brachiocephalic vasculature and a dilated and calcified tortuous aorta with some resulting rightward tracheal deviation which is overall similar appearance to prior configuration. No acute cardiomediastinal contour abnormalities are seen. No dense apical capping or other features to suggest an acute intra mediastinal process. Some chronic hyperinflation and coarsened interstitial changes are present in the lungs. No consolidation, features of edema, pneumothorax, or effusion. Interval progression of the midthoracic compression deformity is overall age indeterminate. Questionable deformity of the right radius and ulna though possibly projectional as these are incidentally included on the lateral radiograph. IMPRESSION: 1. No acute cardiopulmonary abnormality. 2. Chronic tortuosity and dilatation of aorta with rightward tracheal deviation. 3. Interval progression of a previously seen  midthoracic compression deformity is overall age indeterminate. Correlate with point tenderness. 4. Questionable deformity of the radius and ulna though possibly projectional as these are incidentally included on the  lateral radiograph. Laterality uncertain. Correlate with clinical symptoms and consider dedicated views as warranted. Electronically Signed   By: Kreg Shropshire M.D.   On: 01/04/2020 17:00   DG Pelvis 1-2 Views  Result Date: 01/04/2020 CLINICAL DATA:  Fall EXAM: PELVIS - 1-2 VIEW COMPARISON:  CT 04/16/2012 FINDINGS: Comminuted, mildly displaced and impacted inter trochanteric femur fracture. Additional suspicious lucency noted in the right femoral head neck junction as well could reflect an additional fracture line. Femoral head remains normally located. Left femur is intact. Remaining bones of the pelvis are intact and congruent though portions of the sacrum are obscured by overlying bowel gas. Transitional lumbosacral vertebrae noted incidentally. Right lateral hip swelling, possible hematoma and probable hip effusion. IMPRESSION: 1. Comminuted, mildly displaced and impacted intertrochanteric right femur fracture. 2. Additional suspicious lucency in the right femoral head neck junction could reflect an additional subcapital right femoral fracture line. 3. Right lateral hip swelling, possible hematoma and probable hip effusion. Electronically Signed   By: Kreg Shropshire M.D.   On: 01/04/2020 16:55   DG Shoulder Right  Result Date: 01/04/2020 CLINICAL DATA:  Pain after fall. EXAM: RIGHT SHOULDER - 2+ VIEW COMPARISON:  None. FINDINGS: There is a comminuted fracture of the humeral head and neck with mild displacement. No humeral head dislocation. No other acute abnormalities. IMPRESSION: Comminuted fracture of the humeral head neck with mild displacement. Electronically Signed   By: Gerome Sam III M.D   On: 01/04/2020 12:12   DG Forearm Right  Result Date: 01/04/2020 CLINICAL DATA:  Fall.   Right arm pain. EXAM: RIGHT FOREARM - 2 VIEW COMPARISON:  None. FINDINGS: No fracture or bone lesion. Elbow and wrist joints are normally spaced and aligned. Soft tissues are unremarkable. IMPRESSION: Negative. Electronically Signed   By: Amie Portland M.D.   On: 01/04/2020 18:30   CT Head Wo Contrast  Result Date: 01/04/2020 CLINICAL DATA:  Ground level fall EXAM: CT HEAD WITHOUT CONTRAST TECHNIQUE: Contiguous axial images were obtained from the base of the skull through the vertex without intravenous contrast. COMPARISON:  12/27/2019 FINDINGS: Brain: Confluent hypodensities throughout the periventricular and subcortical white matter consistent with chronic small vessel ischemic changes, stable. No signs of acute infarct or hemorrhage. Lateral ventricles and midline structures are otherwise unremarkable. No acute extra-axial fluid collections. No mass effect. Vascular: No hyperdense vessel or unexpected calcification. Skull: Normal. Negative for fracture or focal lesion. Sinuses/Orbits: Mild chronic mucosal thickening left sphenoid sinus. Remaining sinuses are clear. Other: None. IMPRESSION: 1. Stable chronic small vessel ischemic changes. No acute intracranial process. Electronically Signed   By: Sharlet Salina M.D.   On: 01/04/2020 17:17   CT Cervical Spine Wo Contrast  Result Date: 01/04/2020 CLINICAL DATA:  Ground level fall, right arm pain EXAM: CT CERVICAL SPINE WITHOUT CONTRAST TECHNIQUE: Multidetector CT imaging of the cervical spine was performed without intravenous contrast. Multiplanar CT image reconstructions were also generated. COMPARISON:  None. FINDINGS: Alignment: Alignment is anatomic. Skull base and vertebrae: No acute fracture. No primary bone lesion or focal pathologic process. Soft tissues and spinal canal: No prevertebral fluid or swelling. No visible canal hematoma. Disc levels: Mild multilevel spondylosis greatest at C2-3, C4-5, and C5-6. Upper chest: Airway is patent.  Lung apices  are clear. Other: Reconstructed images demonstrate no additional findings. IMPRESSION: 1. Mild cervical spondylosis.  No acute fractures. Electronically Signed   By: Sharlet Salina M.D.   On: 01/04/2020 17:19   CT SHOULDER RIGHT WO CONTRAST  Result Date: 01/04/2020  CLINICAL DATA:  Shoulder injury after fall EXAM: CT OF THE UPPER RIGHT EXTREMITY WITHOUT CONTRAST TECHNIQUE: Multidetector CT imaging of the upper right extremity was performed according to the standard protocol. COMPARISON:  None. FINDINGS: Bones/Joint/Cartilage Comminuted mildly impacted fracture seen through the proximal humerus involving the surgical neck, greater and lesser tuberosities. There is slight impaction of the humeral shaft on the humeral head. The humeral head still articulates with the glenoid. No AC joint widening is seen. No other fracture is identified. Ligaments Suboptimally assessed by CT. Muscles and Tendons There is edema seen surrounding the deltoid musculature. There is suboptimal visualization of the tendons, however the supraspinatus tendon appears to be grossly intact. Soft tissues Tissue swelling seen along the lateral aspect of the shoulder. IMPRESSION: Comminuted mildly impacted fracture seen through the surgical neck and proximal humerus with slight impaction. Overlying soft tissue swelling and deltoid musculature edema. Electronically Signed   By: Jonna Clark M.D.   On: 01/04/2020 20:37   DG Humerus Right  Result Date: 01/04/2020 CLINICAL DATA:  Fall.  Right shoulder pain. EXAM: RIGHT HUMERUS - 2+ VIEW COMPARISON:  None. FINDINGS: Comminuted fracture of the proximal humerus. There is a transverse fracture across the metaphysis with a secondary fracture across the base of the greater tuberosity. Greater tuberosity component is displaced laterally by 5 mm. No other fracture displacement. Glenohumeral and elbow joints are normally aligned as is the [facility] joint. Soft tissue swelling about the shoulder and proximal arm.  IMPRESSION: 1. Mildly comminuted fracture of the proximal humerus, with mild lateral displacement of the greater tuberosity component. No other fractures and no dislocation. Electronically Signed   By: Amie Portland M.D.   On: 01/04/2020 18:29   DG Femur Min 2 Views Left  Result Date: 01/04/2020 CLINICAL DATA:  Larey Seat from ground level.  Leg pain. EXAM: LEFT FEMUR 2 VIEWS COMPARISON:  06/28/2013 FINDINGS: No fracture or bone lesion. Hip and knee joints are normally spaced and aligned. There scattered femoral artery vascular calcifications. Soft tissues otherwise unremarkable. IMPRESSION: No fracture or dislocation. Electronically Signed   By: Amie Portland M.D.   On: 01/04/2020 18:28   DG Femur Min 2 Views Right  Result Date: 01/04/2020 CLINICAL DATA:  Ground level fall.  Right knee/leg pain. EXAM: RIGHT FEMUR 2 VIEWS COMPARISON:  None. FINDINGS: Non comminuted intertrochanteric fracture of the proximal right femur, with no significant displacement, but with mild varus angulation. No other fractures. Hip and knee joints are normally aligned. Skeletal structures are demineralized. IMPRESSION: 1. Non comminuted, nondisplaced intertrochanteric fracture of the proximal right femur with mild varus angulation. No dislocation. Electronically Signed   By: Amie Portland M.D.   On: 01/04/2020 18:27    EKG: Independently reviewed. Sinus tachycardia at 103 bpm. LVH with repolarization abnormality. Repolarization abnormality, J-point elevation in anterior leads.  Assessment/Plan Principal Problem:   Intertrochanteric fracture of right femur (HCC) Active Problems:   Seizure disorder (HCC)   H/O: CVA (cerebrovascular accident)   Protein-calorie malnutrition, severe- wgt 86 lbs   Chronic combined systolic and diastolic congestive heart failure (HCC)   Acute on chronic renal insufficiency   Alzheimer's dementia without behavioral disturbance (HCC)   Gout   Humeral head fracture   Humeral surgical neck  fracture  Right humeral head and neck fracture Right intertrochanteric femur fracture > Fractures occurred in setting of mechanical fall at skilled nursing facility > Patient does not appear to have hit her head > Orthopedic consulted in the ED - Appreciate orthopedics assistance -  Hip fracture protocol - Pain control with Norco and as needed Dilaudid - Nonweightbearing - SCDs for now - N.p.o. at midnight  AKI on CKD 3 > Creatinine 1.71 from baseline of 1.38, appears mildly volume down - Gentle IV fluids for the next 12 hours - Hold ARB for now - Avoid nephrotoxic agents - Trend renal function  Seizure > Recently admitted for seizures with adjustment of her antiepileptics - Continue Vimpat 200 mg twice daily and Keppra 1000 mg twice daily  Alzheimer's dementia > Patient slightly worse than her baseline prior to her previous admission per daughter. Was more functional and oriented to location in the past. Currently orientation person only but does remember events. - Continue home Aricept - Delirium precautions  Heart failure > Last echo about a week ago showed EF 40-45%, global hypokinesis > Not currently on diuretics appears somewhat volume down - Gentle IV fluids for the next 12 hours - Trend BMP  Hx of CVA - Continue home aspirin  Gout - Continue home allopurinol  Hypertension - Continue home Coreg and amlodipine - Holding ARB in the setting of AKI  Malnutrition - Consult to dietitian  DVT prophylaxis: SCDs  Code Status:   Full, patient's daughter notes DNR is on her facility form, states she is not sure why this is but states her brother may have made this decision, she would like to discuss this with him before deciding to change to DNR or not. Family Communication:  Daughter updated at bedside Disposition Plan:   Patient is from:  [facility] skilled nursing facility  Anticipated DC to:  Skilled nursing facility  Anticipated DC date:  Pending clinical  course  Anticipated DC barriers: None  Consults called:  Orthopedics, Dr. Everardo Pacific consulted by EDP  Admission status:  Inpatient, telemetry   Severity of Illness: The appropriate patient status for this patient is INPATIENT. Inpatient status is judged to be reasonable and necessary in order to provide the required intensity of service to ensure the patient's safety. The patient's presenting symptoms, physical exam findings, and initial radiographic and laboratory data in the context of their chronic comorbidities is felt to place them at high risk for further clinical deterioration. Furthermore, it is not anticipated that the patient will be medically stable for discharge from the hospital within 2 midnights of admission. The following factors support the patient status of inpatient.   " The patient's presenting symptoms include hip and shoulder pain. " The worrisome physical exam findings include hip and shoulder pain. " The initial radiographic and laboratory data are worrisome because of shoulder and femur fracture, elevated creatinine.. " The chronic co-morbidities include heart failure, CKD, Alzheimer's dementia, CVA, seizures, gout, hypertension, malnutrition.   * I certify that at the point of admission it is my clinical judgment that the patient will require inpatient hospital care spanning beyond 2 midnights from the point of admission due to high intensity of service, high risk for further deterioration and high frequency of surveillance required.Synetta Fail MD [facility]  How to contact the [facility] Attending or Consulting provider 7A - 7P or covering provider during after hours 7P -7A, for this patient?   1. Check the care team in [facility] and look for a) attending/consulting [facility] provider listed and b) the [facility] team listed 2. Log into [email or web address] and use [place]'s universal password to access. If you do not have the password, please contact the hospital  operator. 3. Locate the [facility] provider you are  looking for under [facility] and page to a number that you can be directly reached. 4. If you still have difficulty reaching the provider, please page the [facility] (Director on Call) for the Hospitalists listed on amion for assistance.  01/04/2020, 9:53 PM

## 2020-01-04 NOTE — ED Notes (Signed)
Pt transferred to hospital bed for comfort. Daughter at bedside

## 2020-01-04 NOTE — H&P (View-Only) (Signed)
ORTHOPAEDIC CONSULTATION  REQUESTING PHYSICIAN: [place], Greggory Brandy, MD  Chief Complaint: Right femur and hip fracture  HPI: [name] is a 79 y.o. female with recent discharge 2 to 3 days ago to a nursing facility after seizure in setting dementia and multiple medical comorbidities.  Patient unfortunately has been stubborn per her daughter Phineas Real who we obtain a history from the phone and has likely not been ambulating with assistive devices and was found after a fall today.  She endorses pain in her hip and shoulder on the right.   History obtained on the phone from [place] who along with her brother share healthcare power of attorney roles.   Past Medical History:  Diagnosis Date  . Abdominal pain 02/24/2013  . Acute combined systolic and diastolic congestive heart failure ([place]) 02/24/2013  . Acute on chronic renal insufficiency 05/15/2015  . Acute renal failure ([place]) 12/28/2012  . Acute renal failure superimposed on stage 3 chronic kidney disease ([place])   . Anemia 05/20/2015  . 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 Basal ganglia infarction ([place]) 01/08/2013  . CAP (community acquired pneumonia)   . Chest pain 05/14/2015  . Chronic combined systolic and diastolic CHF (congestive heart failure) ([place]) 03/06/2014  . Congestive heart failure (CHF) ([place])   . Constipation 02/24/2013  . Cough   . Diastolic dysfunction-grade 3 05/20/2015  . Dyspnea 02/24/2013  . Encephalopathy 12/28/2012  . Epilepsy ([place])   . Generalized seizure ([place]) 12/28/2012  . H/O: CVA (cerebrovascular accident) 01/04/2013  . Hyperammonemia ([place]) 05/17/2015  . Hyperglycemia 02/24/2013  . Hyperkalemia 02/11/2013  . Hypertension   . 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 Hyponatremia 02/11/2013  . Influenza A 02/11/2013  . Liver enzyme elevation 02/11/2013  . Metabolic acidosis 53/20/2334  . Metabolic encephalopathy 3/56/8616  . NICM (nonischemic cardiomyopathy) -EF 25% 05/20/2015   . Pleuritic chest pain 02/24/2013  . PNA (pneumonia) 05/20/2015  . Pneumobilia 02/12/2013  . Pneumonia   . Seizure ([place]) 12/28/2012  . Seizures ([place])   . Smoker   . Stroke ([place])   . Troponin level elevated 05/14/2015  . Volume overload 02/24/2013  . Weight gain 02/24/2013   Past Surgical History:  Procedure Laterality Date  . NO PAST SURGERIES     Social History   Socioeconomic History  . Marital status: Single    Spouse name: Not on file  . Number of children: 3  . Years of education: 31  . Highest education level: Not on file  Occupational History    Comment: Retired  Tobacco Use  . Smoking status: Current Some Day Smoker    Packs/day: 0.25    Types: Cigarettes  . Smokeless tobacco: Never Used  . Tobacco comment: 1/2 caigarettes a day  Substance and Sexual Activity  . Alcohol use: Yes    Alcohol/week: 0.0 standard drinks    Comment: occasional  . Drug use: No  . Sexual activity: Never  Other Topics Concern  . Not on file  Social History Narrative   Patient lives at home alone.    Retired.   Education one year of college.   Left handed.   Caffeine one cup of coffee daily and one cup of tea.   Social Determinants of Health   Financial Resource Strain:   . Difficulty of Paying Living Expenses: Not on file  Food Insecurity:   . Worried About Charity fundraiser in the Last Year: Not on file  .  Ran Out of Food in the Last Year: Not on file  Transportation Needs:   . Lack of Transportation (Medical): Not on file  . Lack of Transportation (Non-Medical): Not on file  Physical Activity:   . Days of Exercise per Week: Not on file  . Minutes of Exercise per Session: Not on file  Stress:   . Feeling of Stress : Not on file  Social Connections:   . Frequency of Communication with Friends and Family: Not on file  . Frequency of Social Gatherings with Friends and Family: Not on file  . Attends Religious Services: Not on file  . Active Member of Clubs or Organizations:  Not on file  . Attends Archivist Meetings: Not on file  . Marital Status: Not on file   Family History  Problem Relation Age of Onset  . Cancer Father   . Anemia Daughter    No Known Allergies Prior to Admission medications   Medication Sig Start Date End Date Taking? Authorizing Provider  allopurinol (ZYLOPRIM) 100 MG tablet Take 50 mg by mouth daily.     [provider]  amLODipine (NORVASC) 10 MG tablet Take 10 mg by mouth daily. 07/19/19   [provider]  aspirin 325 MG tablet Take 1 tablet (325 mg total) by mouth daily. 01/03/20   Dwyane Dee, MD  calcium carbonate (OSCAL) 1500 (600 Ca) MG TABS tablet Take 1,500 mg by mouth every evening.  07/26/19   [provider]  carvedilol (COREG) 12.5 MG tablet Take 12.5 mg by mouth 2 (two) times daily with a meal.    [provider]  donepezil (ARICEPT) 5 MG tablet Take 5 mg by mouth at bedtime. 07/02/19   [provider]  FEROSUL 325 (65 Fe) MG tablet Take 325 mg by mouth every morning. 07/26/19   [provider]  lacosamide (VIMPAT) 200 MG TABS tablet Take 1 tablet (200 mg total) by mouth 2 (two) times daily. 01/02/20   Dwyane Dee, MD  levETIRAcetam (KEPPRA) 1000 MG tablet Take 1 tablet (1,000 mg total) by mouth 2 (two) times daily. 01/02/20   Dwyane Dee, MD  losartan (COZAAR) 50 MG tablet Take 1 tablet (50 mg total) by mouth every morning. 01/03/20   Dwyane Dee, MD   DG Chest 2 View  Result Date: 01/04/2020 CLINICAL DATA:  Fall, right hip pain EXAM: CHEST - 2 VIEW COMPARISON:  Radiograph 12/27/2019, 11/17/2017, CT 04/16/2012 FINDINGS: Mild cardiomegaly appearance with marked tortuosity of the brachiocephalic vasculature and a dilated and calcified tortuous aorta with some resulting rightward tracheal deviation which is overall similar appearance to prior configuration. No acute cardiomediastinal contour abnormalities are seen. No dense apical capping or other features to  suggest an acute intra mediastinal process. Some chronic hyperinflation and coarsened interstitial changes are present in the lungs. No consolidation, features of edema, pneumothorax, or effusion. Interval progression of the midthoracic compression deformity is overall age indeterminate. Questionable deformity of the right radius and ulna though possibly projectional as these are incidentally included on the lateral radiograph. IMPRESSION: 1. No acute cardiopulmonary abnormality. 2. Chronic tortuosity and dilatation of aorta with rightward tracheal deviation. 3. Interval progression of a previously seen midthoracic compression deformity is overall age indeterminate. Correlate with point tenderness. 4. Questionable deformity of the radius and ulna though possibly projectional as these are incidentally included on the lateral radiograph. Laterality uncertain. Correlate with clinical symptoms and consider dedicated views as warranted. Electronically Signed   By: Elwin Sleight.D.  On: 01/04/2020 17:00   DG Pelvis 1-2 Views  Result Date: 01/04/2020 CLINICAL DATA:  Fall EXAM: PELVIS - 1-2 VIEW COMPARISON:  CT 04/16/2012 FINDINGS: Comminuted, mildly displaced and impacted inter trochanteric femur fracture. Additional suspicious lucency noted in the right femoral head neck junction as well could reflect an additional fracture line. Femoral head remains normally located. Left femur is intact. Remaining bones of the pelvis are intact and congruent though portions of the sacrum are obscured by overlying bowel gas. Transitional lumbosacral vertebrae noted incidentally. Right lateral hip swelling, possible hematoma and probable hip effusion. IMPRESSION: 1. Comminuted, mildly displaced and impacted intertrochanteric right femur fracture. 2. Additional suspicious lucency in the right femoral head neck junction could reflect an additional subcapital right femoral fracture line. 3. Right lateral hip swelling, possible hematoma  and probable hip effusion. Electronically Signed   By: Lovena Le M.D.   On: 01/04/2020 16:55   DG Shoulder Right  Result Date: 01/04/2020 CLINICAL DATA:  Pain after fall. EXAM: RIGHT SHOULDER - 2+ VIEW COMPARISON:  None. FINDINGS: There is a comminuted fracture of the humeral head and neck with mild displacement. No humeral head dislocation. No other acute abnormalities. IMPRESSION: Comminuted fracture of the humeral head neck with mild displacement. Electronically Signed   By: Dorise Bullion III M.D   On: 01/04/2020 12:12   DG Forearm Right  Result Date: 01/04/2020 CLINICAL DATA:  Fall.  Right arm pain. EXAM: RIGHT FOREARM - 2 VIEW COMPARISON:  None. FINDINGS: No fracture or bone lesion. Elbow and wrist joints are normally spaced and aligned. Soft tissues are unremarkable. IMPRESSION: Negative. Electronically Signed   By: Lajean Manes M.D.   On: 01/04/2020 18:30   CT Head Wo Contrast  Result Date: 01/04/2020 CLINICAL DATA:  Ground level fall EXAM: CT HEAD WITHOUT CONTRAST TECHNIQUE: Contiguous axial images were obtained from the base of the skull through the vertex without intravenous contrast. COMPARISON:  12/27/2019 FINDINGS: Brain: Confluent hypodensities throughout the periventricular and subcortical white matter consistent with chronic small vessel ischemic changes, stable. No signs of acute infarct or hemorrhage. Lateral ventricles and midline structures are otherwise unremarkable. No acute extra-axial fluid collections. No mass effect. Vascular: No hyperdense vessel or unexpected calcification. Skull: Normal. Negative for fracture or focal lesion. Sinuses/Orbits: Mild chronic mucosal thickening left sphenoid sinus. Remaining sinuses are clear. Other: None. IMPRESSION: 1. Stable chronic small vessel ischemic changes. No acute intracranial process. Electronically Signed   By: Randa Ngo M.D.   On: 01/04/2020 17:17   CT Cervical Spine Wo Contrast  Result Date: 01/04/2020 CLINICAL DATA:   Ground level fall, right arm pain EXAM: CT CERVICAL SPINE WITHOUT CONTRAST TECHNIQUE: Multidetector CT imaging of the cervical spine was performed without intravenous contrast. Multiplanar CT image reconstructions were also generated. COMPARISON:  None. FINDINGS: Alignment: Alignment is anatomic. Skull base and vertebrae: No acute fracture. No primary bone lesion or focal pathologic process. Soft tissues and spinal canal: No prevertebral fluid or swelling. No visible canal hematoma. Disc levels: Mild multilevel spondylosis greatest at C2-3, C4-5, and C5-6. Upper chest: Airway is patent.  Lung apices are clear. Other: Reconstructed images demonstrate no additional findings. IMPRESSION: 1. Mild cervical spondylosis.  No acute fractures. Electronically Signed   By: Randa Ngo M.D.   On: 01/04/2020 17:19   DG Humerus Right  Result Date: 01/04/2020 CLINICAL DATA:  Fall.  Right shoulder pain. EXAM: RIGHT HUMERUS - 2+ VIEW COMPARISON:  None. FINDINGS: Comminuted fracture of the proximal humerus. There is a  transverse fracture across the metaphysis with a secondary fracture across the base of the greater tuberosity. Greater tuberosity component is displaced laterally by 5 mm. No other fracture displacement. Glenohumeral and elbow joints are normally aligned as is the [facility] joint. Soft tissue swelling about the shoulder and proximal arm. IMPRESSION: 1. Mildly comminuted fracture of the proximal humerus, with mild lateral displacement of the greater tuberosity component. No other fractures and no dislocation. Electronically Signed   By: Lajean Manes M.D.   On: 01/04/2020 18:29   DG Femur Min 2 Views Left  Result Date: 01/04/2020 CLINICAL DATA:  [place] from ground level.  Leg pain. EXAM: LEFT FEMUR 2 VIEWS COMPARISON:  06/28/2013 FINDINGS: No fracture or bone lesion. Hip and knee joints are normally spaced and aligned. There scattered femoral artery vascular calcifications. Soft tissues otherwise unremarkable.  IMPRESSION: No fracture or dislocation. Electronically Signed   By: Lajean Manes M.D.   On: 01/04/2020 18:28   DG Femur Min 2 Views Right  Result Date: 01/04/2020 CLINICAL DATA:  Ground level fall.  Right knee/leg pain. EXAM: RIGHT FEMUR 2 VIEWS COMPARISON:  None. FINDINGS: Non comminuted intertrochanteric fracture of the proximal right femur, with no significant displacement, but with mild varus angulation. No other fractures. Hip and knee joints are normally aligned. Skeletal structures are demineralized. IMPRESSION: 1. Non comminuted, nondisplaced intertrochanteric fracture of the proximal right femur with mild varus angulation. No dislocation. Electronically Signed   By: Lajean Manes M.D.   On: 01/04/2020 18:27   Family History Reviewed and non-contributory, no pertinent history of problems with bleeding or anesthesia      Review of Systems Deferred due to telehealth   OBJECTIVE  Vitals: Patient Vitals for the past 8 hrs:  BP Pulse Resp SpO2  01/04/20 1851 (!) 189/97 (!) 104 19 95 %  01/04/20 1745 (!) 178/99 (!) 102 20 98 %  01/04/20 1730 (!) 163/98 (!) 103 15 94 %  01/04/20 1630 (!) 172/97 95 20 99 %  01/04/20 1615 (!) 176/94 90 19 93 %  01/04/20 1600 (!) 174/95 87 19 92 %  01/04/20 1545 (!) 176/90 88 16 95 %  01/04/20 1544 (!) 168/94 81 18 93 %  01/04/20 1330 132/85 85 20 94 %   Defered due telehealth    Test Results Imaging X-ray of the right humerus demonstrate a likely 2 part proximal humerus fracture.  X-ray of the right hip demonstrates a displaced intertrochanteric femur fracture on the right.  Labs cbc Recent Labs    01/04/20 1754  WBC 13.5*  HGB 11.6*  HCT 35.6*  PLT 306    Labs inflam No results for input(s): CRP in the last 72 hours.  Invalid input(s): ESR  Labs coag Recent Labs    01/04/20 1754  INR 1.0    Recent Labs    01/02/20 0453 01/04/20 1754  NA 141 141  K 4.2 4.7  CL 107 106  CO2 20* 22  GLUCOSE 94 134*  BUN 19 31*   CREATININE 1.38* 1.71*  CALCIUM 9.6 11.0*     ASSESSMENT AND PLAN: 79 y.o. female with the following: Right intertrochanteric femur fracture and right proximal humerus fracture in a patient with dementia, seizure disorder and multiple other medical comorbidities.   Orthopedics recommends admission to a medical service and we will provide consultation and follow along  History and consent obtained via daughter Phineas Real at [phone].  We discussed the case at length with her and she has a complex situation.  She had just been discharged but is ambulatory at baseline and we do think that surgery would be in her best interest in the setting of multitrauma.  We will plan to do her femur fracture tomorrow pending medical doctor evaluation for optimization.  We have tentatively posted for midmorning tomorrow as the patient had recently been discharged and was under care until 2 days ago.  Discussed the nature of the injury as well as the care with the patient as well as the family.  Nonoperative measures are not well tolerated as patient's on bedrest for extended periods of time tend to develop secondary issues such as pneumonia, urinary tract infections, bedsores and delirium.  Based on this our recommendation is for operative measures.  The risks benefits and alternatives were discussed with the patient including but not limited to the risks of nonoperative treatment, versus surgical intervention including infection, bleeding, nerve injury, periprosthetic fracture, the need for revision surgery, leg length discrepancy, gait change, blood clots, cardiopulmonary complications, morbidity, mortality, among others, and they were willing to proceed.    We discussed that her shoulder may require further surgery however due to positioning and length of surgery we do not think that is in the best interest for the patient to proceed with this tomorrow necessarily.  Will likely deal with the hip tomorrow and may  come back at a later date this week to deal with the shoulder if it is at all indicated.  CT of the shoulder is pending.  - Weight Bearing Status/Activity: Nonweightbearing right upper extremity right lower extremity currently, postoperatively will be weightbearing to tolerance in the right lower extremity.  - Additional recommended labs/tests: none required from orthopedics  -VTE Prophylaxis: Hold for now  - Pain control: As needed  - Follow-up plan: TBD  -Procedures: Hip intramedullary nail on right planned for tomorrow

## 2020-01-04 NOTE — ED Provider Notes (Addendum)
[facility] EMERGENCY DEPARTMENT Provider Note   CSN: [id number] Arrival date & time: 01/04/20  1037     History No chief complaint on file.   [name] is a 79 y.o. female history of CHF, seizure disorder, hypertension, CKD, memory loss.  Patient arrived via EMS today after a fall.  History obtained through triage note as patient is altered.  Patient came from [facility] after a witnessed ground-level fall complaining of right arm pain and right tib-fib pain without obvious injury.  On my initial evaluation patient is lying in bed wearing a cervical collar, no acute distress.  On my examination patient reports that she had a seizure but reports it is currently July 1975.  She is aware that she is in the hospital in [facility] she is alert to self.  She reports she does not remember falling she denies any pain.  Level 5 caveat altered mental status. ---- I attempted to contact patient's facility, [facility].  I spoke with the registration nurse but she could not locate the patient on their census, she then forwarded me to a hallway and the nursing staff there was not aware of the patient.  4:25 PM: I was able to contact patient's daughter [name].  She reports that she visited the patient yesterday around 8 PM and patient had been doing well and had no complaints.  She received a call from her brother today reporting that the nursing staff saw the patient fall [name] is on her way to the emergency department.  She does report that patient had been admitted to the hospital for seizures last week and since discharge has seemed more confused than normal but normally answers questions appropriately but is repetitive.  HPI     Past Medical History:  Diagnosis Date  . Abdominal pain 02/24/2013  . Acute combined systolic and diastolic congestive heart failure (HCC) 02/24/2013  . Acute on chronic renal insufficiency 05/15/2015  . Acute renal failure  (HCC) 12/28/2012  . Acute renal failure superimposed on stage 3 chronic kidney disease (HCC)   . Anemia 05/20/2015  . 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 Basal ganglia infarction (HCC) 01/08/2013  . CAP (community acquired pneumonia)   . Chest pain 05/14/2015  . Chronic combined systolic and diastolic CHF (congestive heart failure) (HCC) 03/06/2014  . Congestive heart failure (CHF) (HCC)   . Constipation 02/24/2013  . Cough   . Diastolic dysfunction-grade 3 05/20/2015  . Dyspnea 02/24/2013  . Encephalopathy 12/28/2012  . Epilepsy (HCC)   . Generalized seizure (HCC) 12/28/2012  . H/O: CVA (cerebrovascular accident) 01/04/2013  . Hyperammonemia (HCC) 05/17/2015  . Hyperglycemia 02/24/2013  . Hyperkalemia 02/11/2013  . Hypertension   . 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 Hyponatremia 02/11/2013  . Influenza A 02/11/2013  . Liver enzyme elevation 02/11/2013  . Metabolic acidosis 12/28/2012  . Metabolic encephalopathy 05/16/2015  . NICM (nonischemic cardiomyopathy) -EF 25% 05/20/2015  . Pleuritic chest pain 02/24/2013  . PNA (pneumonia) 05/20/2015  . Pneumobilia 02/12/2013  . Pneumonia   . Seizure (HCC) 12/28/2012  . Seizures (HCC)   . Smoker   . Stroke (HCC)   . Troponin level elevated 05/14/2015  . Volume overload 02/24/2013  . Weight gain 02/24/2013    Patient Active Problem List   Diagnosis Date Noted  . Witnessed seizure-like activity (HCC) 12/27/2019  . Chronic kidney disease, stage 3b (HCC) 12/27/2019  . Alzheimer's dementia without behavioral disturbance (HCC) 12/27/2019  .  Gout 12/27/2019  . Leukocytosis 12/27/2019  . Cough   . Encounter for palliative care   . Goals of care, counseling/discussion   . Pneumonia   . NICM (nonischemic cardiomyopathy) -EF 25% 05/20/2015  . No significant CAD at cath May 2012 05/20/2015  . Anemia 05/20/2015  . Diastolic dysfunction-grade 3 05/20/2015  . PNA (pneumonia) 05/20/2015  . Acute renal failure  superimposed on stage 3 chronic kidney disease (HCC)   . CAP (community acquired pneumonia)   . Hyperammonemia (HCC) 05/17/2015  . Metabolic encephalopathy 05/16/2015  . Acute on chronic renal insufficiency 05/15/2015  . Chest pain 05/14/2015  . Troponin level elevated 05/14/2015  . Chronic combined systolic and diastolic congestive heart failure (HCC) 03/06/2014  . Acute combined systolic and diastolic congestive heart failure (HCC) 02/24/2013  . Weight gain 02/24/2013  . Volume overload 02/24/2013  . Hyperglycemia 02/24/2013  . Dyspnea 02/24/2013  . Constipation 02/24/2013  . Abdominal pain 02/24/2013  . Pleuritic chest pain 02/24/2013  . Epilepsy (HCC)   . Smoker   . 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 Protein-calorie malnutrition, severe- wgt 86 lbs 02/13/2013  . Pneumobilia 02/12/2013  . Influenza A 02/11/2013  . Hyperkalemia 02/11/2013  . Hyponatremia 02/11/2013  . HCAP (healthcare-associated pneumonia) 02/11/2013  . Liver enzyme elevation 02/11/2013  . Basal ganglia infarction (HCC) 01/08/2013  . H/O: CVA (cerebrovascular accident) 01/04/2013  . Generalized seizure (HCC) 12/28/2012  . 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 Seizure (HCC) 12/28/2012  . Metabolic acidosis 12/28/2012  . Encephalopathy 12/28/2012  . Acute renal failure (HCC) 12/28/2012    Past Surgical History:  Procedure Laterality Date  . NO PAST SURGERIES       OB History   No obstetric history on file.     Family History  Problem Relation Age of Onset  . Cancer Father   . Anemia Daughter     Social History   Tobacco Use  . Smoking status: Current Some Day Smoker    Packs/day: 0.25    Types: Cigarettes  . Smokeless tobacco: Never Used  . Tobacco comment: 1/2 caigarettes a day  Substance Use Topics  . Alcohol use: Yes    Alcohol/week: 0.0 standard drinks    Comment: occasional  . Drug use: No    Home Medications Prior to Admission  medications   Medication Sig Start Date End Date Taking? Authorizing Provider  allopurinol (ZYLOPRIM) 100 MG tablet Take 50 mg by mouth daily.     [provider]  amLODipine (NORVASC) 10 MG tablet Take 10 mg by mouth daily. 07/19/19   [provider]  aspirin 325 MG tablet Take 1 tablet (325 mg total) by mouth daily. 01/03/20   Lewie Chamber, MD  calcium carbonate (OSCAL) 1500 (600 Ca) MG TABS tablet Take 1,500 mg by mouth every evening.  07/26/19   [provider]  carvedilol (COREG) 12.5 MG tablet Take 12.5 mg by mouth 2 (two) times daily with a meal.    [provider]  donepezil (ARICEPT) 5 MG tablet Take 5 mg by mouth at bedtime. 07/02/19   [provider]  FEROSUL 325 (65 Fe) MG tablet Take 325 mg by mouth every morning. 07/26/19   [provider]  lacosamide (VIMPAT) 200 MG TABS tablet Take 1 tablet (200 mg total) by mouth 2 (two) times daily. 01/02/20   Lewie Chamber, MD  levETIRAcetam (KEPPRA) 1000 MG tablet Take 1 tablet (1,000 mg total) by mouth 2 (two) times  daily. 01/02/20   Lewie Chamber, MD  losartan (COZAAR) 50 MG tablet Take 1 tablet (50 mg total) by mouth every morning. 01/03/20   Lewie Chamber, MD    Allergies    Patient has no known allergies.  Review of Systems   Review of Systems  Unable to perform ROS: Mental status change    Physical Exam Updated Vital Signs BP (!) 181/97   Pulse 98   Temp 97.7 F (36.5 C)   Resp 18   SpO2 98%   Physical Exam Constitutional:      General: She is awake.     Appearance: Normal appearance. She is underweight. She is not ill-appearing or toxic-appearing.  HENT:     Head: Normocephalic and atraumatic. No raccoon eyes, Battle's sign or contusion.     Jaw: There is normal jaw occlusion. No trismus.     Right Ear: External ear normal.     Left Ear: External ear normal.     Nose: Nose normal.     Mouth/Throat:     Mouth: Mucous membranes are moist.     Pharynx: Oropharynx is  clear.  Eyes:     General: Vision grossly intact. Gaze aligned appropriately.     Extraocular Movements: Extraocular movements intact.     Conjunctiva/sclera: Conjunctivae normal.  Neck:     Trachea: Trachea and phonation normal. No tracheal tenderness or tracheal deviation.  Cardiovascular:     Rate and Rhythm: Normal rate and regular rhythm.     Pulses:          Radial pulses are 2+ on the right side and 2+ on the left side.       Dorsalis pedis pulses are 2+ on the right side and 2+ on the left side.  Pulmonary:     Effort: Pulmonary effort is normal. No accessory muscle usage or respiratory distress.     Breath sounds: Normal breath sounds and air entry.  Chest:     Chest wall: No deformity or tenderness.  Abdominal:     General: There is no distension. There are no signs of injury.     Palpations: Abdomen is soft.     Tenderness: There is no abdominal tenderness. There is no guarding or rebound.  Musculoskeletal:     Right shoulder: Tenderness present. No deformity. Decreased range of motion.     Left shoulder: No deformity or tenderness. Normal range of motion.     Right upper arm: No deformity or tenderness.     Left upper arm: No deformity or tenderness.     Right elbow: No deformity. No tenderness.     Left elbow: No deformity. No tenderness.     Right forearm: No deformity or tenderness.     Left forearm: No deformity or tenderness.     Right wrist: No deformity or tenderness.     Left wrist: No deformity or tenderness.     Right hand: No deformity or tenderness.     Left hand: No deformity or tenderness.     Cervical back: Normal range of motion and neck supple. No spinous process tenderness or muscular tenderness.     Right hip: Tenderness present. Decreased range of motion.     Left hip: No tenderness. Normal range of motion.     Right upper leg: No deformity or tenderness.     Left upper leg: No deformity or tenderness.     Right knee: No deformity. No tenderness.  Left knee: No deformity. No tenderness.     Right lower leg: No deformity or tenderness.     Left lower leg: No deformity or tenderness.     Right ankle: No deformity. No tenderness.     Left ankle: No deformity. No tenderness.     Right foot: No deformity or tenderness.     Left foot: No deformity or tenderness.     Comments: No midline C/T/L spinal tenderness to palpation, no paraspinal muscle tenderness, no deformity, crepitus, or step-off noted. No sign of injury to the neck or back.  Skin:    General: Skin is warm and dry.  Neurological:     Mental Status: She is alert. She is confused.     GCS: GCS eye subscore is 4. GCS verbal subscore is 4. GCS motor subscore is 6.     Comments: Speech is clear and goal oriented, follows commands confused to time and events leading to ED presentation otherwise normal responses. Major Cranial nerves without deficit, no facial droop Normal strength in upper and lower extremities bilaterally including dorsiflexion and plantar flexion, strong and equal grip strength Sensation normal to light and sharp touch Moves extremities without ataxia, guarding at right arm and right leg.  Psychiatric:        Speech: Speech normal.        Behavior: Behavior is cooperative.      ED Results / Procedures / Treatments   Labs (all labs ordered are listed, but only abnormal results are displayed) Labs Reviewed  CBC WITH DIFFERENTIAL/PLATELET - Abnormal; Notable for the following components:      Result Value   WBC 13.5 (*)    RBC 3.54 (*)    Hemoglobin 11.6 (*)    HCT 35.6 (*)    MCV 100.6 (*)    RDW 17.2 (*)    Neutro Abs 11.6 (*)    All other components within normal limits  COMPREHENSIVE METABOLIC PANEL - Abnormal; Notable for the following components:   Glucose, Bld 134 (*)    BUN 31 (*)    Creatinine, Ser 1.71 (*)    Calcium 11.0 (*)    Total Protein 8.3 (*)    GFR, Estimated 30 (*)    All other components within normal limits  APTT -  Abnormal; Notable for the following components:   aPTT 21 (*)    All other components within normal limits  CBG MONITORING, ED - Abnormal; Notable for the following components:   Glucose-Capillary 122 (*)    All other components within normal limits  RESP PANEL BY RT-PCR (FLU A&B, COVID) [id number]  PROTIME-INR  URINALYSIS, ROUTINE W REFLEX MICROSCOPIC  AMMONIA  CBG MONITORING, ED    EKG None  Radiology DG Chest 2 View  Result Date: 01/04/2020 CLINICAL DATA:  Fall, right hip pain EXAM: CHEST - 2 VIEW COMPARISON:  Radiograph 12/27/2019, 11/17/2017, CT 04/16/2012 FINDINGS: Mild cardiomegaly appearance with marked tortuosity of the brachiocephalic vasculature and a dilated and calcified tortuous aorta with some resulting rightward tracheal deviation which is overall similar appearance to prior configuration. No acute cardiomediastinal contour abnormalities are seen. No dense apical capping or other features to suggest an acute intra mediastinal process. Some chronic hyperinflation and coarsened interstitial changes are present in the lungs. No consolidation, features of edema, pneumothorax, or effusion. Interval progression of the midthoracic compression deformity is overall age indeterminate. Questionable deformity of the right radius and ulna though possibly projectional as these are incidentally included on the lateral  radiograph. IMPRESSION: 1. No acute cardiopulmonary abnormality. 2. Chronic tortuosity and dilatation of aorta with rightward tracheal deviation. 3. Interval progression of a previously seen midthoracic compression deformity is overall age indeterminate. Correlate with point tenderness. 4. Questionable deformity of the radius and ulna though possibly projectional as these are incidentally included on the lateral radiograph. Laterality uncertain. Correlate with clinical symptoms and consider dedicated views as warranted. Electronically Signed   By: Kreg Shropshire M.D.   On: 01/04/2020  17:00   DG Pelvis 1-2 Views  Result Date: 01/04/2020 CLINICAL DATA:  Fall EXAM: PELVIS - 1-2 VIEW COMPARISON:  CT 04/16/2012 FINDINGS: Comminuted, mildly displaced and impacted inter trochanteric femur fracture. Additional suspicious lucency noted in the right femoral head neck junction as well could reflect an additional fracture line. Femoral head remains normally located. Left femur is intact. Remaining bones of the pelvis are intact and congruent though portions of the sacrum are obscured by overlying bowel gas. Transitional lumbosacral vertebrae noted incidentally. Right lateral hip swelling, possible hematoma and probable hip effusion. IMPRESSION: 1. Comminuted, mildly displaced and impacted intertrochanteric right femur fracture. 2. Additional suspicious lucency in the right femoral head neck junction could reflect an additional subcapital right femoral fracture line. 3. Right lateral hip swelling, possible hematoma and probable hip effusion. Electronically Signed   By: Kreg Shropshire M.D.   On: 01/04/2020 16:55   DG Shoulder Right  Result Date: 01/04/2020 CLINICAL DATA:  Pain after fall. EXAM: RIGHT SHOULDER - 2+ VIEW COMPARISON:  None. FINDINGS: There is a comminuted fracture of the humeral head and neck with mild displacement. No humeral head dislocation. No other acute abnormalities. IMPRESSION: Comminuted fracture of the humeral head neck with mild displacement. Electronically Signed   By: Gerome Sam III M.D   On: 01/04/2020 12:12   DG Forearm Right  Result Date: 01/04/2020 CLINICAL DATA:  Fall.  Right arm pain. EXAM: RIGHT FOREARM - 2 VIEW COMPARISON:  None. FINDINGS: No fracture or bone lesion. Elbow and wrist joints are normally spaced and aligned. Soft tissues are unremarkable. IMPRESSION: Negative. Electronically Signed   By: Amie Portland M.D.   On: 01/04/2020 18:30   CT Head Wo Contrast  Result Date: 01/04/2020 CLINICAL DATA:  Ground level fall EXAM: CT HEAD WITHOUT CONTRAST  TECHNIQUE: Contiguous axial images were obtained from the base of the skull through the vertex without intravenous contrast. COMPARISON:  12/27/2019 FINDINGS: Brain: Confluent hypodensities throughout the periventricular and subcortical white matter consistent with chronic small vessel ischemic changes, stable. No signs of acute infarct or hemorrhage. Lateral ventricles and midline structures are otherwise unremarkable. No acute extra-axial fluid collections. No mass effect. Vascular: No hyperdense vessel or unexpected calcification. Skull: Normal. Negative for fracture or focal lesion. Sinuses/Orbits: Mild chronic mucosal thickening left sphenoid sinus. Remaining sinuses are clear. Other: None. IMPRESSION: 1. Stable chronic small vessel ischemic changes. No acute intracranial process. Electronically Signed   By: Sharlet Salina M.D.   On: 01/04/2020 17:17   CT Cervical Spine Wo Contrast  Result Date: 01/04/2020 CLINICAL DATA:  Ground level fall, right arm pain EXAM: CT CERVICAL SPINE WITHOUT CONTRAST TECHNIQUE: Multidetector CT imaging of the cervical spine was performed without intravenous contrast. Multiplanar CT image reconstructions were also generated. COMPARISON:  None. FINDINGS: Alignment: Alignment is anatomic. Skull base and vertebrae: No acute fracture. No primary bone lesion or focal pathologic process. Soft tissues and spinal canal: No prevertebral fluid or swelling. No visible canal hematoma. Disc levels: Mild multilevel spondylosis greatest at C2-3,  C4-5, and C5-6. Upper chest: Airway is patent.  Lung apices are clear. Other: Reconstructed images demonstrate no additional findings. IMPRESSION: 1. Mild cervical spondylosis.  No acute fractures. Electronically Signed   By: Sharlet Salina M.D.   On: 01/04/2020 17:19   DG Humerus Right  Result Date: 01/04/2020 CLINICAL DATA:  Fall.  Right shoulder pain. EXAM: RIGHT HUMERUS - 2+ VIEW COMPARISON:  None. FINDINGS: Comminuted fracture of the proximal  humerus. There is a transverse fracture across the metaphysis with a secondary fracture across the base of the greater tuberosity. Greater tuberosity component is displaced laterally by 5 mm. No other fracture displacement. Glenohumeral and elbow joints are normally aligned as is the [facility] joint. Soft tissue swelling about the shoulder and proximal arm. IMPRESSION: 1. Mildly comminuted fracture of the proximal humerus, with mild lateral displacement of the greater tuberosity component. No other fractures and no dislocation. Electronically Signed   By: Amie Portland M.D.   On: 01/04/2020 18:29   DG Femur Min 2 Views Left  Result Date: 01/04/2020 CLINICAL DATA:  Larey Seat from ground level.  Leg pain. EXAM: LEFT FEMUR 2 VIEWS COMPARISON:  06/28/2013 FINDINGS: No fracture or bone lesion. Hip and knee joints are normally spaced and aligned. There scattered femoral artery vascular calcifications. Soft tissues otherwise unremarkable. IMPRESSION: No fracture or dislocation. Electronically Signed   By: Amie Portland M.D.   On: 01/04/2020 18:28   DG Femur Min 2 Views Right  Result Date: 01/04/2020 CLINICAL DATA:  Ground level fall.  Right knee/leg pain. EXAM: RIGHT FEMUR 2 VIEWS COMPARISON:  None. FINDINGS: Non comminuted intertrochanteric fracture of the proximal right femur, with no significant displacement, but with mild varus angulation. No other fractures. Hip and knee joints are normally aligned. Skeletal structures are demineralized. IMPRESSION: 1. Non comminuted, nondisplaced intertrochanteric fracture of the proximal right femur with mild varus angulation. No dislocation. Electronically Signed   By: Amie Portland M.D.   On: 01/04/2020 18:27    Procedures Procedures (including critical care time)  Medications Ordered in ED Medications  sodium chloride 0.9 % bolus 250 mL (0 mLs Intravenous Stopped 01/04/20 1956)  fentaNYL (SUBLIMAZE) injection 25 mcg (25 mcg Intravenous Given 01/04/20 1849)    ED Course  I  have reviewed the triage vital signs and the nursing notes.  Pertinent labs & imaging results that were available during my care of the patient were reviewed by me and considered in my medical decision making (see chart for details).  Clinical Course as of Jan 04 2028  Sat Jan 04, 2020  1618 [phone]   [BM]  1625 [phone]: [facility]   [BM]  1905 Dr. Everardo Pacific   [BM]  2956 [phone]   [BM]  2009 Hospitalist   [BM]    Clinical Course User Index [BM] Elizabeth Palau   MDM Rules/Calculators/A&P                         Additional history obtained from: 1. Nursing notes from this visit. 2. Family members. 3. Review of electronic medical records.  Reviewed discharge summary from patient's admission on 12/27/2019, she was discharged on January 02, 2020.  Patient had been admitted from home and discharged to a SNF.  Per their note patient arrived to the ED for breakthrough seizures and encephalopathy, patient was given IV benzos and loaded with Keppra, CT and CT a head neck without acute abnormalities.  EEG showed left posterior quadrant PLEDs.  MRI  did not show any acute findings.  Neurology consulted and patient considered to have epilepsy due to previous chronic strokes.  Patient's Vimpat and Keppra were increased by neurology.  During that admission patient did have elevated troponin levels but this was thought to be secondary to demand and seizure activity.  They document patient has history of Alzheimer's dementia. 4. I attempted multiple times to contact patient's facility but each time there was no answer from the nursing station. -------------------------- 79 year old female presented today after a witnessed fall, on my examination she denies any pain but does have tenderness of the right shoulder.  She has no evidence of significant injury of the head neck back chest abdomen.  She also has some tenderness and is guarding at her right hip.  She is confused to date, unclear  patient's baseline mental status will obtain altered mental status work-up in addition to imaging for traumatic injury. - CBC shows a leukocytosis of 13.5, may be secondary to injury today no history of infectious type symptoms.  Hemoglobin of 11.6 appears baseline PT/INR within normal limits. CBG 122 no evidence of hypoglycemia as cause of AMS. CMP shows creatinine mildly elevated from baseline at 1.7, no emergent Electra derangement, LFT elevations or gap.  Covid/flu panel, urinalysis and ammonia levels pending.  At time of admission and will need to be followed by inpatient team.  Nursing staff attempted in and out cath however patient urinated on herself just prior to their attempt.  Will recollect.  Patient had CT imaging of the head and cervical spine which were negative for acute findings.   Chest x-ray was also negative for acute cardiopulmonary pathology, worsening of compression fractures were noted but patient has no tenderness in the midline do not suspect acute compression fracture at this time.  A incidentally chest x-ray noted possible fracture of the forearm bones, subsequent x-ray imaging of that area is negative.  X-ray of the right shoulder and humerus is significant for comminuted proximal humerus fracture.  X-ray of the pelvis and right femur is significant for comminuted and displaced right intertrochanteric fracture.  Patient has no evidence of injury of the chest or abdomen that require further CT imaging.  I discussed the case with on-call orthopedist Dr. Everardo Pacific who advised hospitalist admission and plans for repair tomorrow. - Consult with hospitalist Dr. Alinda Money, patient was accepted for admission.  I updated patient's daughter [name] who is at bedside of plan, agreeable for admission and she reports that her mother's mental status appears baseline since her discharge from the hospital 2 days ago but has seemed more confused ever since she was admitted for seizures last month but  no acute changes.  On reassessment patient is resting comfortably in bed, sleeping, no acute distress is arousable to voice.  Case discussed with Dr. Audley Hose during this visit who agrees with plan.  Note: Portions of this report may have been transcribed using voice recognition software. Every effort was made to ensure accuracy; however, inadvertent computerized transcription errors may still be present. Final Clinical Impression(s) / ED Diagnoses Final diagnoses:  Fall, initial encounter  Closed fracture of head of right humerus, initial encounter  Closed comminuted intertrochanteric fracture of proximal end of right femur, initial encounter [facility])    Rx / DC Orders ED Discharge Orders    None       Elizabeth Palau 01/04/20 2051    Elizabeth Palau 01/04/20 2052    Cheryll Cockayne, MD 01/10/20 [phone]

## 2020-01-04 NOTE — ED Notes (Signed)
CBG 122 

## 2020-01-04 NOTE — ED Triage Notes (Signed)
Patient arrived by [facility] from [place] following witnessed ground level fall. Patient complains of right arm and right tib/ fib pain, no lacs, no deformity

## 2020-01-04 NOTE — ED Notes (Signed)
Attempted in and out cath and there was no urine return. Ot noted to be soiled with urine so bed pad wass changed and a purewick was placed on pt with clean bruef.

## 2020-01-05 ENCOUNTER — Inpatient Hospital Stay (HOSPITAL_COMMUNITY): Payer: Medicare HMO | Admitting: Anesthesiology

## 2020-01-05 ENCOUNTER — Inpatient Hospital Stay (HOSPITAL_COMMUNITY): Payer: Medicare HMO

## 2020-01-05 ENCOUNTER — Encounter (HOSPITAL_COMMUNITY)
Admission: EM | Disposition: A | Discharge status: Skilled Nursing Facility | Payer: Self-pay | Source: Skilled Nursing Facility | Attending: Internal Medicine

## 2020-01-05 ENCOUNTER — Encounter (HOSPITAL_COMMUNITY): Payer: Self-pay | Admitting: Internal Medicine

## 2020-01-05 DIAGNOSIS — M1A9XX0 Chronic gout, unspecified, without tophus (tophi): Secondary | ICD-10-CM

## 2020-01-05 DIAGNOSIS — Z8673 Personal history of transient ischemic attack (TIA), and cerebral infarction without residual deficits: Secondary | ICD-10-CM

## 2020-01-05 DIAGNOSIS — R9431 Abnormal electrocardiogram [ECG] [EKG]: Secondary | ICD-10-CM

## 2020-01-05 DIAGNOSIS — E43 Unspecified severe protein-calorie malnutrition: Secondary | ICD-10-CM

## 2020-01-05 DIAGNOSIS — F0280 Dementia in other diseases classified elsewhere without behavioral disturbance: Secondary | ICD-10-CM

## 2020-01-05 DIAGNOSIS — G40909 Epilepsy, unspecified, not intractable, without status epilepticus: Secondary | ICD-10-CM

## 2020-01-05 DIAGNOSIS — G309 Alzheimer's disease, unspecified: Secondary | ICD-10-CM

## 2020-01-05 DIAGNOSIS — R079 Chest pain, unspecified: Secondary | ICD-10-CM

## 2020-01-05 HISTORY — PX: FEMUR IM NAIL: SHX1597

## 2020-01-05 HISTORY — PX: INTRAMEDULLARY (IM) NAIL INTERTROCHANTERIC: SHX5875

## 2020-01-05 LAB — CBC
HCT: 28.6 % — ABNORMAL LOW (ref 36.0–46.0)
Hemoglobin: 9.2 g/dL — ABNORMAL LOW (ref 12.0–15.0)
MCH: 32.6 pg (ref 26.0–34.0)
MCHC: 32.2 g/dL (ref 30.0–36.0)
MCV: 101.4 fL — ABNORMAL HIGH (ref 80.0–100.0)
Platelets: 218 10*3/uL (ref 150–400)
RBC: 2.82 MIL/uL — ABNORMAL LOW (ref 3.87–5.11)
RDW: 17.2 % — ABNORMAL HIGH (ref 11.5–15.5)
WBC: 13.1 10*3/uL — ABNORMAL HIGH (ref 4.0–10.5)
nRBC: 0 % (ref 0.0–0.2)

## 2020-01-05 LAB — BASIC METABOLIC PANEL
Anion gap: 12 (ref 5–15)
BUN: 27 mg/dL — ABNORMAL HIGH (ref 8–23)
CO2: 19 mmol/L — ABNORMAL LOW (ref 22–32)
Calcium: 9.6 mg/dL (ref 8.9–10.3)
Chloride: 107 mmol/L (ref 98–111)
Creatinine, Ser: 1.49 mg/dL — ABNORMAL HIGH (ref 0.44–1.00)
GFR, Estimated: 36 mL/min — ABNORMAL LOW (ref >60)
Glucose, Bld: 120 mg/dL — ABNORMAL HIGH (ref 70–99)
Potassium: 4.9 mmol/L (ref 3.5–5.1)
Sodium: 138 mmol/L (ref 135–145)

## 2020-01-05 LAB — SURGICAL PCR SCREEN
MRSA, PCR: NEGATIVE
Staphylococcus aureus: NEGATIVE

## 2020-01-05 LAB — ABO/RH: ABO/RH(D): A POS

## 2020-01-05 LAB — TROPONIN I (HIGH SENSITIVITY): Troponin I (High Sensitivity): 51 ng/L — ABNORMAL HIGH (ref <18)

## 2020-01-05 SURGERY — FIXATION, FRACTURE, INTERTROCHANTERIC, WITH INTRAMEDULLARY ROD
Anesthesia: General | Site: Hip | Laterality: Right

## 2020-01-05 MED ORDER — FENTANYL CITRATE (PF) 100 MCG/2ML IJ SOLN: 25.0000 ug | INTRAMUSCULAR | Status: DC | PRN

## 2020-01-05 MED ORDER — ACETAMINOPHEN 10 MG/ML IV SOLN
INTRAVENOUS | Status: DC | PRN
  Administered 2020-01-05: 1000 mg via INTRAVENOUS

## 2020-01-05 MED ORDER — OXYCODONE HCL 5 MG/5ML PO SOLN: 5.0000 mg | Freq: Once | ORAL | Status: DC | PRN

## 2020-01-05 MED ORDER — METOCLOPRAMIDE HCL 5 MG PO TABS: 5.0000 mg | ORAL_TABLET | Freq: Three times a day (TID) | ORAL | Status: DC | PRN

## 2020-01-05 MED ORDER — METOCLOPRAMIDE HCL 5 MG/ML IJ SOLN: 5.0000 mg | Freq: Three times a day (TID) | INTRAMUSCULAR | Status: DC | PRN

## 2020-01-05 MED ORDER — DEXAMETHASONE SODIUM PHOSPHATE 10 MG/ML IJ SOLN
INTRAMUSCULAR | Status: AC
  Filled 2020-01-05: qty 1

## 2020-01-05 MED ORDER — CHLORHEXIDINE GLUCONATE 0.12 % MT SOLN: 15.0000 mL | Freq: Once | OROMUCOSAL | Status: AC

## 2020-01-05 MED ORDER — ESMOLOL HCL 100 MG/10ML IV SOLN
INTRAVENOUS | Status: AC
  Filled 2020-01-05: qty 10

## 2020-01-05 MED ORDER — CHLORHEXIDINE GLUCONATE 0.12 % MT SOLN
OROMUCOSAL | Status: AC
  Administered 2020-01-05: 15 mL
  Filled 2020-01-05: qty 15

## 2020-01-05 MED ORDER — MENTHOL 3 MG MT LOZG: 1.0000 | LOZENGE | OROMUCOSAL | Status: DC | PRN

## 2020-01-05 MED ORDER — ALBUMIN HUMAN 5 % IV SOLN: INTRAVENOUS | Status: DC | PRN

## 2020-01-05 MED ORDER — SUGAMMADEX SODIUM 200 MG/2ML IV SOLN
INTRAVENOUS | Status: DC | PRN
  Administered 2020-01-05: 75 mg via INTRAVENOUS

## 2020-01-05 MED ORDER — POLYETHYLENE GLYCOL 3350 17 G PO PACK: 17.0000 g | PACK | Freq: Every day | ORAL | Status: DC | PRN

## 2020-01-05 MED ORDER — ACETAMINOPHEN 500 MG PO TABS
500.0000 mg | ORAL_TABLET | Freq: Three times a day (TID) | ORAL | Status: AC
  Administered 2020-01-05 – 2020-01-06 (×3): 500 mg via ORAL
  Filled 2020-01-05 (×4): qty 1

## 2020-01-05 MED ORDER — ONDANSETRON HCL 4 MG/2ML IJ SOLN
INTRAMUSCULAR | Status: AC
  Filled 2020-01-05: qty 2

## 2020-01-05 MED ORDER — ENOXAPARIN SODIUM 30 MG/0.3ML ~~LOC~~ SOLN: 30.0000 mg | SUBCUTANEOUS | Status: DC

## 2020-01-05 MED ORDER — ROCURONIUM BROMIDE 10 MG/ML (PF) SYRINGE
PREFILLED_SYRINGE | INTRAVENOUS | Status: AC
  Filled 2020-01-05: qty 10

## 2020-01-05 MED ORDER — PROPOFOL 10 MG/ML IV BOLUS
INTRAVENOUS | Status: DC | PRN
  Administered 2020-01-05: 60 mg via INTRAVENOUS

## 2020-01-05 MED ORDER — ONDANSETRON HCL 4 MG/2ML IJ SOLN: 4.0000 mg | Freq: Four times a day (QID) | INTRAMUSCULAR | Status: DC | PRN

## 2020-01-05 MED ORDER — FENTANYL CITRATE (PF) 250 MCG/5ML IJ SOLN
INTRAMUSCULAR | Status: DC | PRN
  Administered 2020-01-05: 25 ug via INTRAVENOUS

## 2020-01-05 MED ORDER — MORPHINE SULFATE (PF) 2 MG/ML IV SOLN: 0.2500 mg | INTRAVENOUS | Status: DC | PRN

## 2020-01-05 MED ORDER — HYDROCODONE-ACETAMINOPHEN 5-325 MG PO TABS
1.0000 | ORAL_TABLET | ORAL | Status: DC | PRN
  Administered 2020-01-06 – 2020-01-09 (×5): 1 via ORAL
  Filled 2020-01-05 (×5): qty 1

## 2020-01-05 MED ORDER — LACTATED RINGERS IV SOLN: INTRAVENOUS | Status: DC

## 2020-01-05 MED ORDER — VANCOMYCIN HCL 1000 MG IV SOLR
INTRAVENOUS | Status: DC | PRN
  Administered 2020-01-05: 1000 mg via TOPICAL

## 2020-01-05 MED ORDER — DOCUSATE SODIUM 100 MG PO CAPS
100.0000 mg | ORAL_CAPSULE | Freq: Two times a day (BID) | ORAL | Status: DC
  Administered 2020-01-05 – 2020-01-09 (×8): 100 mg via ORAL
  Filled 2020-01-05 (×8): qty 1

## 2020-01-05 MED ORDER — ACETAMINOPHEN 10 MG/ML IV SOLN
INTRAVENOUS | Status: AC
  Filled 2020-01-05: qty 100

## 2020-01-05 MED ORDER — 0.9 % SODIUM CHLORIDE (POUR BTL) OPTIME
TOPICAL | Status: DC | PRN
  Administered 2020-01-05: 1000 mL

## 2020-01-05 MED ORDER — CEFAZOLIN SODIUM-DEXTROSE 1-4 GM/50ML-% IV SOLN
1.0000 g | Freq: Once | INTRAVENOUS | Status: AC
  Administered 2020-01-05: 1 g via INTRAVENOUS
  Filled 2020-01-05 (×2): qty 50

## 2020-01-05 MED ORDER — ORAL CARE MOUTH RINSE: 15.0000 mL | Freq: Once | OROMUCOSAL | Status: AC

## 2020-01-05 MED ORDER — ONDANSETRON HCL 4 MG/2ML IJ SOLN
INTRAMUSCULAR | Status: DC | PRN
  Administered 2020-01-05: 4 mg via INTRAVENOUS

## 2020-01-05 MED ORDER — ONDANSETRON HCL 4 MG PO TABS: 4.0000 mg | ORAL_TABLET | Freq: Four times a day (QID) | ORAL | Status: DC | PRN

## 2020-01-05 MED ORDER — CEFAZOLIN SODIUM-DEXTROSE 2-4 GM/100ML-% IV SOLN: 2.0000 g | Freq: Four times a day (QID) | INTRAVENOUS | Status: DC

## 2020-01-05 MED ORDER — LIDOCAINE HCL (PF) 2 % IJ SOLN
INTRAMUSCULAR | Status: AC
  Filled 2020-01-05: qty 5

## 2020-01-05 MED ORDER — PHENOL 1.4 % MT LIQD: 1.0000 | OROMUCOSAL | Status: DC | PRN

## 2020-01-05 MED ORDER — PHENYLEPHRINE 40 MCG/ML (10ML) SYRINGE FOR IV PUSH (FOR BLOOD PRESSURE SUPPORT)
PREFILLED_SYRINGE | INTRAVENOUS | Status: AC
  Filled 2020-01-05: qty 10

## 2020-01-05 MED ORDER — ESMOLOL HCL 100 MG/10ML IV SOLN
INTRAVENOUS | Status: DC | PRN
  Administered 2020-01-05: 20 mg via INTRAVENOUS

## 2020-01-05 MED ORDER — OXYCODONE HCL 5 MG PO TABS: 5.0000 mg | ORAL_TABLET | Freq: Once | ORAL | Status: DC | PRN

## 2020-01-05 MED ORDER — CEFAZOLIN SODIUM-DEXTROSE 2-4 GM/100ML-% IV SOLN
2.0000 g | INTRAVENOUS | Status: AC
  Administered 2020-01-05: 2 g via INTRAVENOUS
  Filled 2020-01-05: qty 100

## 2020-01-05 MED ORDER — PHENYLEPHRINE 40 MCG/ML (10ML) SYRINGE FOR IV PUSH (FOR BLOOD PRESSURE SUPPORT)
PREFILLED_SYRINGE | INTRAVENOUS | Status: DC | PRN
  Administered 2020-01-05: 120 ug via INTRAVENOUS

## 2020-01-05 MED ORDER — VANCOMYCIN HCL 1000 MG IV SOLR
INTRAVENOUS | Status: AC
  Filled 2020-01-05: qty 1000

## 2020-01-05 MED ORDER — LIDOCAINE 2% (20 MG/ML) 5 ML SYRINGE
INTRAMUSCULAR | Status: DC | PRN
  Administered 2020-01-05: 40 mg via INTRAVENOUS

## 2020-01-05 MED ORDER — FENTANYL CITRATE (PF) 250 MCG/5ML IJ SOLN
INTRAMUSCULAR | Status: AC
  Filled 2020-01-05: qty 5

## 2020-01-05 MED ORDER — ROCURONIUM BROMIDE 10 MG/ML (PF) SYRINGE
PREFILLED_SYRINGE | INTRAVENOUS | Status: DC | PRN
  Administered 2020-01-05: 30 mg via INTRAVENOUS

## 2020-01-05 MED ORDER — DEXAMETHASONE SODIUM PHOSPHATE 10 MG/ML IJ SOLN
INTRAMUSCULAR | Status: DC | PRN
  Administered 2020-01-05: 4 mg via INTRAVENOUS

## 2020-01-05 SURGICAL SUPPLY — 44 items
BIT DRILL 4.0X280 (BIT) ×1 IMPLANT
BNDG COHESIVE 4X5 TAN STRL (GAUZE/BANDAGES/DRESSINGS) ×1 IMPLANT
BNDG COHESIVE 6X5 TAN STRL LF (GAUZE/BANDAGES/DRESSINGS) ×2 IMPLANT
BNDG GAUZE ELAST 4 BULKY (GAUZE/BANDAGES/DRESSINGS) ×1 IMPLANT
CLSR STERI-STRIP ANTIMIC 1/2X4 (GAUZE/BANDAGES/DRESSINGS) ×2 IMPLANT
COVER PERINEAL POST (MISCELLANEOUS) ×2 IMPLANT
COVER SURGICAL LIGHT HANDLE (MISCELLANEOUS) ×2 IMPLANT
DERMABOND ADVANCED (GAUZE/BANDAGES/DRESSINGS) ×1
DERMABOND ADVANCED .7 DNX12 (GAUZE/BANDAGES/DRESSINGS) IMPLANT
DRAPE STERI IOBAN 125X83 (DRAPES) ×2 IMPLANT
DRSG AQUACEL AG ADV 3.5X 4 (GAUZE/BANDAGES/DRESSINGS) ×3 IMPLANT
DRSG AQUACEL AG ADV 3.5X 6 (GAUZE/BANDAGES/DRESSINGS) ×1 IMPLANT
DURAPREP 26ML APPLICATOR (WOUND CARE) ×2 IMPLANT
ELECT REM PT RETURN 9FT ADLT (ELECTROSURGICAL) ×2
ELECTRODE REM PT RTRN 9FT ADLT (ELECTROSURGICAL) ×1 IMPLANT
GLOVE BIO SURGEON STRL SZ 6.5 (GLOVE) ×4 IMPLANT
GLOVE BIOGEL PI IND STRL 8 (GLOVE) ×1 IMPLANT
GLOVE BIOGEL PI INDICATOR 8 (GLOVE) ×2
GLOVE ECLIPSE 8.0 STRL XLNG CF (GLOVE) ×6 IMPLANT
GLOVE INDICATOR 6.5 STRL GRN (GLOVE) ×2 IMPLANT
GOWN STRL REUS W/ TWL LRG LVL3 (GOWN DISPOSABLE) ×2 IMPLANT
GOWN STRL REUS W/TWL 2XL LVL3 (GOWN DISPOSABLE) IMPLANT
GOWN STRL REUS W/TWL LRG LVL3 (GOWN DISPOSABLE) ×8
GUIDE PIN 3.2X330 (PIN) ×2
GUIDEWIRE BALL NOSE 3.0X900 (WIRE) ×1 IMPLANT
KIT TURNOVER KIT B (KITS) ×2 IMPLANT
NAIL 9X130X20 SHORT (Nail) ×1 IMPLANT
NS IRRIG 1000ML POUR BTL (IV SOLUTION) ×2 IMPLANT
PACK GENERAL/GYN (CUSTOM PROCEDURE TRAY) ×2 IMPLANT
PAD ARMBOARD 7.5X6 YLW CONV (MISCELLANEOUS) ×2 IMPLANT
PIN GUIDE 3.2X330 (PIN) IMPLANT
SCREW LOCK CORT 5.0X30 (Screw) ×1 IMPLANT
SCREW LOCK LAG 10.5X95 GALILEO (Screw) ×1 IMPLANT
SLING ARM FOAM STRAP MED (SOFTGOODS) ×1 IMPLANT
SUT MNCRL AB 4-0 PS2 18 (SUTURE) ×2 IMPLANT
SUT VIC AB 0 CT1 27 (SUTURE) ×2
SUT VIC AB 0 CT1 27XBRD ANBCTR (SUTURE) ×1 IMPLANT
SUT VIC AB 2-0 CT1 27 (SUTURE)
SUT VIC AB 2-0 CT1 TAPERPNT 27 (SUTURE) ×1 IMPLANT
SUT VIC AB 3-0 SH 27 (SUTURE) ×2
SUT VIC AB 3-0 SH 27X BRD (SUTURE) IMPLANT
TOOL ACTIVATION (INSTRUMENTS) ×1 IMPLANT
TOWEL GREEN STERILE (TOWEL DISPOSABLE) ×2 IMPLANT
TOWEL GREEN STERILE FF (TOWEL DISPOSABLE) ×2 IMPLANT

## 2020-01-05 NOTE — Progress Notes (Signed)
PHARMACY NOTE:  ANTIMICROBIAL RENAL DOSAGE ADJUSTMENT  Current antimicrobial regimen includes a mismatch between antimicrobial dosage and estimated renal function.  As per policy approved by the Pharmacy & Therapeutics and Medical Executive Committees, the antimicrobial dosage will be adjusted accordingly.  Current antimicrobial dosage:  Cefazolin 2g q6h x 2 doses  Indication: Post- op coverage  Renal Function:  Estimated Creatinine Clearance: 18.2 mL/min (A) (by C-G formula based on SCr of 1.49 mg/dL (H)). []      On intermittent HD, scheduled: []      On CRRT    Antimicrobial dosage has been changed to:  Cefazolin 1g IV x 1 dose at 2300  Additional comments:   Thank you for allowing pharmacy to be a part of this patient's care.  , PharmD, BCPS Clinical Pharmacist Clinical phone for 01/05/2020: Georgina Pillion 01/05/2020 9:17 PM   **Pharmacist phone directory can now be found on [email or web address] ([email or web address]).  Listed under [facility].

## 2020-01-05 NOTE — ED Notes (Signed)
Pt stated she was having some right sided chest pain that has been coming and going for the last couple of days. Repeat EKG obtained. States it is mainly in her chest and does not really radiate. Admitting provider notified and made aware if patient's complaint.

## 2020-01-05 NOTE — Transfer of Care (Signed)
Immediate Anesthesia Transfer of Care Note  Patient: [name]  Procedure(s) Performed: INTRAMEDULLARY (IM) NAIL INTERTROCHANTRIC (Right Hip)  Patient Location: PACU  Anesthesia Type:General  Level of Consciousness: awake, drowsy and patient cooperative  Airway & Oxygen Therapy: Patient Spontanous Breathing and Patient connected to face mask oxygen  Post-op Assessment: Report given to RN and Post -op Vital signs reviewed and stable  Post vital signs: Reviewed and stable  Last Vitals:  Vitals Value Taken Time  BP 133/77 01/05/20 1217  Temp    Pulse 93 01/05/20 1221  Resp 15 01/05/20 1221  SpO2 100 % 01/05/20 1221  Vitals shown include unvalidated device data.  Last Pain:  Vitals:   01/05/20 0815  TempSrc: Oral  PainSc:          Complications: No complications documented.

## 2020-01-05 NOTE — Interval H&P Note (Signed)
Limited exam due to patient cooperation, wwp extremity.  No sling in place on arm.  Limited exam thjere.  History and Physical Interval Note:  01/05/2020 10:28 AM  [name]  has presented today for surgery, with the diagnosis of Right hip fracture and right proximal humerus fracture.  The various methods of treatment have been discussed with the patient and family. After consideration of risks, benefits and other options for treatment, the patient has consented to  Procedure(s): INTRAMEDULLARY (IM) NAIL INTERTROCHANTRIC (Right) as a surgical intervention.  The patient's history has been reviewed, patient examined, no change in status, stable for surgery.  I have reviewed the patient's chart and labs.  Questions were answered to the patient's satisfaction.     [name]

## 2020-01-05 NOTE — Op Note (Signed)
Orthopaedic Surgery Operative Note (CSN: [id number])  [name]  11/11/1940 Date of Surgery: 01/05/2020   Diagnoses:  Right hip fracture and right proximal humerus fracture  Procedure: Right closed management of a proximal humerus fracture Right hip intramedullary nail placement   Operative Finding Successful completion of the planned procedure.  Patient's malnutrition and overall condition led [place] to think that nonoperative measures for her minimally displaced proximal humerus fracture would be ideal.  If this displaces we may consider reverse shoulder arthroplasty.  We do not think that she is a good candidate for an ORIF of her right proximal humerus.  She will continue her sling for 6 weeks on that side.  In regards to the hip fixation was robust and we had a good strong construct.  She is allowed for 1 cm of controlled compression.  She is very thin and we worry that she may have prominence of the lateral part of the screw however construct were used had controlled compression rather than screw migration.  Post-operative plan: The patient will be weightbearing to tolerance in the right lower extremity, nonweightbearing in a sling right upper extremity with repeat x-rays of the right upper extremity in 2 to 3 days or at least before discharge.  The patient will be readmitted to the floor.  DVT prophylaxis Lovenox 30 mg a day due to weight.   Pain control with PRN pain medication preferring oral medicines.  Follow up plan will be scheduled in approximately 7 days for incision check and XR.  Post-Op Diagnosis: Same Surgeons:Primary: [name], MD Assistants:Caroline McBane PA-C Location: [facility] OR ROOM 05 Anesthesia: General Antibiotics: Ancef 2 g with local vancomycin powder 1 g at the surgical site Tourniquet time: * No tourniquets in log * Estimated Blood Loss: Minimal Complications: None Specimens: None Implants: Implant Name Type Inv. Item Serial No. Manufacturer Lot No.  LRB No. Used Action  SCREW LOCK CORT 5.0X30 - [id number] Screw SCREW LOCK CORT 5.0X30  [employer]  Right 1 Implanted  NAIL [id number] SHORT - [id number] Nail NAIL [id number] SHORT  [employer]  Right 1 Implanted  SCREW LOCK LAG 10.5X95 [place] - [id number] Screw SCREW LOCK LAG 10.5X95 GALILEO  [employer]  Right 1 Implanted    Indications for Surgery:   [name] is a 79 y.o. female with fall resulting in a right proximal humerus fracture and a right intertrochanteric femur fracture.  Benefits and risks of operative and nonoperative management were discussed prior to surgery with patient/guardian(s) and informed consent form was completed.  Specific risks including infection, need for additional surgery, nonunion, malunion, loss of fixation, rotational abnormality and periprosthetic fracture amongst others   Procedure:   The patient was identified properly. Informed consent was obtained and the surgical site was marked. The patient was taken up to suite where general anesthesia was induced.  The patient was positioned supine on a fracture table.  The right hip was prepped and draped in the usual sterile fashion.  Timeout was performed before the beginning of the case.  The patient was placed supine on a fracture table and appropriate reduction was obtained and visualized on fluoroscopy prior to the beginning of the procedure.  We made an incision proximal to the greater trochanter and dissected down through the fascia.  We then carefully placed our starting awllocalizing under fluoroscopy prior to advancing the awl into the bone and sliding a ball-tipped guidewire through the awl into the femoral canal.  The wire was passed to  an appropriate level past the isthmus.  We selected a length of nail noted above.  Entry reamer was used.  At this point we placed our nail localizing under fluoroscopy that it was at the appropriate level prior to using the outrigger device to pass a wire and then the  cephalo-medullary screw.  The screw was locked proximally to avoid over collapse.  We took final shots at the proximal femur and then used the outrigger to place one distal interlock screw.  Final pictures were obtained.  The wounds were thoroughly irrigated closed in a multilayer fashion with absorable sutures.  A sterile dressing was placed.  The patient was awoken from general anesthesia and taken to the PACU in stable condition without complication.    Alfonse Alpers, PA-C, present and scrubbed throughout the case, critical for completion in a timely fashion, and for retraction, instrumentation, closure.

## 2020-01-05 NOTE — Anesthesia Procedure Notes (Signed)
Procedure Name: Intubation Date/Time: 01/05/2020 11:20 AM Performed by: Adria Dill, CRNA Pre-anesthesia Checklist: Patient identified, Emergency Drugs available, Suction available and Patient being monitored Patient Re-evaluated:Patient Re-evaluated prior to induction Oxygen Delivery Method: Circle system utilized Preoxygenation: Pre-oxygenation with 100% oxygen Induction Type: IV induction Ventilation: Mask ventilation without difficulty Laryngoscope Size: Miller and 2 Grade View: Grade I Tube type: Oral Tube size: 7.0 mm Number of attempts: 1 Airway Equipment and Method: Stylet and Oral airway Placement Confirmation: ETT inserted through vocal cords under direct vision,  positive ETCO2 and breath sounds checked- equal and bilateral Secured at: 21 cm Tube secured with: Tape Dental Injury: Teeth and Oropharynx as per pre-operative assessment

## 2020-01-05 NOTE — OR Nursing (Signed)
P.A. APPLIED RIGHT ARM SLING AFTER HIP SURGERY.

## 2020-01-05 NOTE — Anesthesia Preprocedure Evaluation (Signed)
Anesthesia Evaluation  Patient identified by MRN, date of birth, ID band Patient awake    Reviewed: Allergy & Precautions, H&P , NPO status , Patient's Chart, lab work & pertinent test results  Airway Mallampati: II   Neck ROM: full    Dental   Pulmonary shortness of breath, Current Smoker,    breath sounds clear to auscultation       Cardiovascular hypertension, +CHF   Rhythm:regular Rate:Normal  EF 25%   Neuro/Psych Seizures -,  PSYCHIATRIC DISORDERS Dementia CVA    GI/Hepatic   Endo/Other    Renal/GU Renal InsufficiencyRenal disease     Musculoskeletal   Abdominal   Peds  Hematology  (+) Blood dyscrasia, anemia ,   Anesthesia Other Findings   Reproductive/Obstetrics                             Anesthesia Physical Anesthesia Plan  ASA: III  Anesthesia Plan: General   Post-op Pain Management:    Induction: Intravenous  PONV Risk Score and Plan: 2 and Ondansetron, Dexamethasone and Treatment may vary due to age or medical condition  Airway Management Planned: Oral ETT  Additional Equipment:   Intra-op Plan:   Post-operative Plan: Extubation in OR  Informed Consent: I have reviewed the patients History and Physical, chart, labs and discussed the procedure including the risks, benefits and alternatives for the proposed anesthesia with the patient or authorized representative who has indicated his/her understanding and acceptance.       Plan Discussed with: CRNA, Anesthesiologist and Surgeon  Anesthesia Plan Comments:         Anesthesia Quick Evaluation

## 2020-01-05 NOTE — Progress Notes (Addendum)
PROGRESS NOTE  [name] UYQ:[id number] DOB: January 29, 1941 DOA: 01/04/2020 PCP: [name], MD  Brief History   [name] is a 79 y.o. female with medical history significant of heart failure, CKD 3, Alzheimer's dementia, CVA, seizure, gout, hypertension, malnutrition who presents from her nursing facility following a witnessed mechanical fall.             Due to patient's dementia history obtained with the assistance of her daughter and chart review.  Daughter who states patient was doing well at 8 PM yesterday and per facility she had a witnessed mechanical fall at the facility, no one has been able to get in touch with facility staff. She fell on her right side. Patient states that she remembers she was walking down the hall prior to her fall and does not remember tripping on anything in particular. Daughter notes that patient is supposed to be a fall risk and was reportedly not walking with a walker or assistance. Patient states her pain is bad as a 10 out of 10 now that she has been receiving pain medication her pain has been coming and going. It is better if she is does not move and is painful with movement. She denies fever, cough, chest pain, shortness of breath, abdominal pain, nausea.   Of note, patient recently admitted for seizures and encephalopathy from 11/26-12/2, she was discharged to her facility for only couple of days before having this fall.   ED Course: Vital signs in ED significant for BP in the 140s to 180 systolic. Lab work-up showed BMP with creatinine of 1.7 from a baseline of 1.4, LFTs with calcium of 8.3, CBC with mild leukocytosis 13 and hemoglobin of 11.6 which is stable. PT and INR within normal limits, PTT little bit low at 21. Ammonia pending, UA pending, respiratory panel for flu and Covid pending. Imaging included chest x-ray which showed no acute cardiopulmonary disease. Shoulder x-ray, forearm x-ray and humerus x-ray on the right which showed a  right shoulder fracture. Pelvis x-ray right femur x-ray and left femur x-ray showed a right intertrochanteric fracture with possible additional fractures on right, normal left femur x-ray. CT head showed no acute changes, CT C-spine showed no acute changes. CT of the right shoulder is pending. Orthopedics was consulted and will see the patient tomorrow.  On the morning of 01/05/2020 nursing alerted me to abnormal EKG on this patient who was complaining of chest pain. The EKG suggested acute inferior infarct, however this writer did not appreciate a significant change from EKG performed last night and even 1 month ago. Troponins ordered.  I consulted cardiology. However, when cardiology arrived, the patient had been taken to the OR for hip surgery.  She is seen post-op in the PACU. According to nursing the patient has been calm until a couple of hours ago. She feels that she is likely sundowning.  Consultants  Orthopedic surgery Cardiology  Procedures  ORIF hip  Antibiotics   Anti-infectives (From admission, onward)    Start     Dose/Rate Route Frequency Ordered Stop   01/05/20 1155  vancomycin (VANCOCIN) powder  Status:  Discontinued          As needed 01/05/20 1201 01/05/20 1227   01/05/20 0600  ceFAZolin (ANCEF) IVPB 2g/100 mL premix        2 g 200 mL/hr over 30 Minutes Intravenous On call to O.R. 01/05/20 0344 01/05/20 1152      Subjective  The patient is resting quietly.  She is unhappy with her iced tea. She seems somewhat agitated, but comfortable.  Objective   Vitals:  Vitals:   01/05/20 1800 01/05/20 1830  BP: 102/71 119/73  Pulse: 99 100  Resp: (!) 22 (!) 21  Temp:    SpO2: 100% 100%   Exam:  Constitutional:  The patient is awake, alert, and oriented x 3. No acute distress. Respiratory:  No increased work of breathing. No wheezes, rales, or rhonchi No tactile fremitus Cardiovascular:  Regular rate and rhythm No murmurs, ectopy, or gallups. No lateral PMI. No  thrills. Abdomen:  Abdomen is soft, non-tender, non-distended No hernias, masses, or organomegaly Normoactive bowel sounds.  Musculoskeletal:  No cyanosis, clubbing, or edema Skin:  No rashes, lesions, ulcers palpation of skin: no induration or nodules Neurologic:  CN 2-12 intact Sensation all 4 extremities intact Psychiatric:  Mental status Mood, affect appropriate Orientation to person, place, time  judgment and insight appear intact  I have personally reviewed the following:   Today's Data  Vitals, BMP, CBC  Micro Data  MRSA by PCR negative  Imaging  Shoulder Hip  Cardiology Data  EKG, troponins  Scheduled Meds:  [MAR Hold] allopurinol  50 mg Oral Daily   [MAR Hold] amLODipine  10 mg Oral Daily   [MAR Hold] aspirin  325 mg Oral Daily   [MAR Hold] calcium carbonate  1,250 mg Oral QPM   [MAR Hold] carvedilol  12.5 mg Oral BID WC   [MAR Hold] donepezil  5 mg Oral QHS   [MAR Hold] ferrous sulfate  325 mg Oral q morning - 10a   [MAR Hold] lacosamide  200 mg Oral BID   [MAR Hold] levETIRAcetam  1,000 mg Oral BID   Continuous Infusions:  lactated ringers 10 mL/hr at 01/05/20 1115    Principal Problem:   Intertrochanteric fracture of right femur (HCC) Active Problems:   Seizure disorder (HCC)   H/O: CVA (cerebrovascular accident)   Protein-calorie malnutrition, severe- wgt 86 lbs   Chronic combined systolic and diastolic congestive heart failure (HCC)   Acute on chronic renal insufficiency   Alzheimer's dementia without behavioral disturbance (HCC)   Gout   Humeral head fracture   Humeral surgical neck fracture   LOS: 1 day   A & P   Intertrochanteric fracture of right femur (HCC) Active Problems:   Seizure disorder (HCC)   H/O: CVA (cerebrovascular accident)   Protein-calorie malnutrition, severe- wgt 86 lbs   Chronic combined systolic and diastolic congestive heart failure (HCC)   Acute on chronic renal insufficiency   Alzheimer's dementia without  behavioral disturbance (HCC)   Gout   Humeral head fracture   Humeral surgical neck fracture    Right humeral head and neck fracture Right intertrochanteric femur fracture > Fractures occurred in setting of mechanical fall at skilled nursing facility > Patient does not appear to have hit her head > Orthopedic consulted in the ED - Appreciate orthopedics assistance - Hip fracture protocol, s/p ORIF - Pain control with Norco and as needed Dilaudid - Nonweightbearing - SCDs for now   AKI on CKD 3b > Creatinine 1.71 from baseline of 1.38, appears mildly volume down - Gentle IV fluids for the next 12 hours - Hold ARB for now - Avoid nephrotoxic agents - Trend renal function  Chest pain/Abnormal EKG/Elevated troponin: Cardiology consulted, but taken to OR before she could be seen. Will cycle troponins overnight and repeat EKG in the morning. If cardiac issues remain a concern, please reconsult cardiology.  Seizure > Recently admitted for seizures with adjustment of her antiepileptics - Continue Vimpat 200 mg twice daily and Keppra 1000 mg twice daily   Alzheimer's dementia > Patient slightly worse than her baseline prior to her previous admission per daughter. Was more functional and oriented to location in the past. Currently orientation person only but does remember events. - Continue home Aricept - Delirium precautions  - Pt does seem to be sundowning this evening.   Heart failure > Last echo about a week ago showed EF 40-45%, global hypokinesis > Not currently on diuretics appears somewhat volume down - Gentle IV fluids for the next 12 hours - Trend BMP   Hx of CVA - Continue home aspirin   Gout - Continue home allopurinol   Hypertension - Continue home Coreg and amlodipine - Holding ARB in the setting of AKI   Malnutrition - Consult to dietitian  I have seen and examined this patient myself. I have spent 42 minutes in her evaluation and care.   DVT  prophylaxis:      SCDs  Code Status:              Full, patient's daughter notes DNR is on her facility form, states she is not sure why this is but states her brother may have made this decision, she would like to discuss this with him before deciding to change to DNR or not. Family Communication:       None currently available Disposition Plan:              Patient is from:                        [place] skilled nursing facility             Anticipated DC to:                   Skilled nursing facility  Halynn Reitano, DO [facility] Direct contact: see [email or web address]  7PM-7AM contact night coverage as above 01/05/2020, 7:19 PM  LOS: 1 day

## 2020-01-05 NOTE — Progress Notes (Signed)
   Initially called to see Ms Mano for preop eval and CP.  However, pt went to surgery prior to our eval.   Cards is available as needed, please recontact if you wish pt to be seen.  Theodore Demark, PA-C 01/05/2020 6:22 PM

## 2020-01-06 ENCOUNTER — Encounter (HOSPITAL_COMMUNITY): Payer: Self-pay | Admitting: Internal Medicine

## 2020-01-06 DIAGNOSIS — D649 Anemia, unspecified: Secondary | ICD-10-CM

## 2020-01-06 DIAGNOSIS — I10 Essential (primary) hypertension: Secondary | ICD-10-CM

## 2020-01-06 DIAGNOSIS — I5042 Chronic combined systolic (congestive) and diastolic (congestive) heart failure: Secondary | ICD-10-CM

## 2020-01-06 DIAGNOSIS — I2510 Atherosclerotic heart disease of native coronary artery without angina pectoris: Secondary | ICD-10-CM | POA: Diagnosis not present

## 2020-01-06 LAB — CBC WITH DIFFERENTIAL/PLATELET
Abs Immature Granulocytes: 0.08 10*3/uL — ABNORMAL HIGH (ref 0.00–0.07)
Basophils Absolute: 0 10*3/uL (ref 0.0–0.1)
Basophils Relative: 0 %
Eosinophils Absolute: 0 10*3/uL (ref 0.0–0.5)
Eosinophils Relative: 0 %
HCT: 14.5 % — ABNORMAL LOW (ref 36.0–46.0)
Hemoglobin: 4.7 g/dL — CL (ref 12.0–15.0)
Immature Granulocytes: 1 %
Lymphocytes Relative: 11 %
Lymphs Abs: 1.3 10*3/uL (ref 0.7–4.0)
MCH: 32 pg (ref 26.0–34.0)
MCHC: 32.4 g/dL (ref 30.0–36.0)
MCV: 98.6 fL (ref 80.0–100.0)
Monocytes Absolute: 1.4 10*3/uL — ABNORMAL HIGH (ref 0.1–1.0)
Monocytes Relative: 11 %
Neutro Abs: 9.8 10*3/uL — ABNORMAL HIGH (ref 1.7–7.7)
Neutrophils Relative %: 77 %
Platelets: 173 10*3/uL (ref 150–400)
RBC: 1.47 MIL/uL — ABNORMAL LOW (ref 3.87–5.11)
RDW: 17.2 % — ABNORMAL HIGH (ref 11.5–15.5)
WBC: 12.5 10*3/uL — ABNORMAL HIGH (ref 4.0–10.5)
nRBC: 0 % (ref 0.0–0.2)

## 2020-01-06 LAB — BASIC METABOLIC PANEL
Anion gap: 13 (ref 5–15)
BUN: 40 mg/dL — ABNORMAL HIGH (ref 8–23)
CO2: 19 mmol/L — ABNORMAL LOW (ref 22–32)
Calcium: 8.1 mg/dL — ABNORMAL LOW (ref 8.9–10.3)
Chloride: 102 mmol/L (ref 98–111)
Creatinine, Ser: 2.06 mg/dL — ABNORMAL HIGH (ref 0.44–1.00)
GFR, Estimated: 24 mL/min — ABNORMAL LOW (ref >60)
Glucose, Bld: 113 mg/dL — ABNORMAL HIGH (ref 70–99)
Potassium: 4.7 mmol/L (ref 3.5–5.1)
Sodium: 134 mmol/L — ABNORMAL LOW (ref 135–145)

## 2020-01-06 LAB — PREPARE RBC (CROSSMATCH)

## 2020-01-06 LAB — HEMOGLOBIN AND HEMATOCRIT, BLOOD
HCT: 28.2 % — ABNORMAL LOW (ref 36.0–46.0)
Hemoglobin: 9.6 g/dL — ABNORMAL LOW (ref 12.0–15.0)

## 2020-01-06 MED ORDER — HEPARIN SODIUM (PORCINE) 5000 UNIT/ML IJ SOLN
5000.0000 [IU] | Freq: Two times a day (BID) | INTRAMUSCULAR | Status: DC
  Administered 2020-01-07 – 2020-01-09 (×5): 5000 [IU] via SUBCUTANEOUS
  Filled 2020-01-06 (×6): qty 1

## 2020-01-06 MED ORDER — SODIUM CHLORIDE 0.9% IV SOLUTION: Freq: Once | INTRAVENOUS | Status: AC

## 2020-01-06 MED ORDER — PROSOURCE PLUS PO LIQD
30.0000 mL | Freq: Two times a day (BID) | ORAL | Status: DC
  Administered 2020-01-07 – 2020-01-09 (×5): 30 mL via ORAL
  Filled 2020-01-06 (×6): qty 30

## 2020-01-06 MED ORDER — ADULT MULTIVITAMIN W/MINERALS CH
1.0000 | ORAL_TABLET | Freq: Every day | ORAL | Status: DC
  Administered 2020-01-07 – 2020-01-09 (×3): 1 via ORAL
  Filled 2020-01-06 (×4): qty 1

## 2020-01-06 NOTE — Progress Notes (Signed)
Initial Nutrition Assessment  DOCUMENTATION CODES:   Underweight, Severe malnutrition in context of chronic illness  INTERVENTION:   -Feeding assistance with meals -MVI with minerals daily -Magic cup TID with meals, each supplement provides 290 kcal and 9 grams of protein -30 ml Prosource Plus BID, each supplement provides 100 kcals and 15 grams protein  NUTRITION DIAGNOSIS:   Severe Malnutrition related to chronic illness (dementia) as evidenced by moderate fat depletion, severe fat depletion, moderate muscle depletion, severe muscle depletion.  GOAL:   Patient will meet greater than or equal to 90% of their needs  MONITOR:   PO intake, Supplement acceptance, Labs, Weight trends, Skin, I & O's  REASON FOR ASSESSMENT:   Consult Assessment of nutrition requirement/status  ASSESSMENT:   [name] is a 79 y.o. female with medical history significant of heart failure, CKD 3, Alzheimer's dementia, CVA, seizure, gout, hypertension, malnutrition who presents from her nursing facility following a witnessed mechanical fall.  Pt admitted with rt hip fracture s/p fall.   12/5- s/p Procedure: Right closed management of a proximal humerus fracture Right hip intramedullary nail placement 12/6- s/p BSE- advanced to regular diet with thin liquids  Reviewed I/O's: +1.1 L x 24 hours and +1.3 L since admission  Pt not very interactive with this RD. She responded "yeah" to most questions asked.   Per SLP notes, pt does not typically eat with dentures.   Case discussed with RN, who reports pt eats very little despite encouragement.   Reviewed wt hx; wt has been stable over the past year.   Medications reviewed and include calcium carbonate, colace, vimpat, and keppra.   Labs reviewed: Na: 134.   NUTRITION - FOCUSED PHYSICAL EXAM:    Most Recent Value  Orbital Region Severe depletion  Upper Arm Region Moderate depletion  Thoracic and Lumbar Region Severe depletion   Buccal Region Severe depletion  Temple Region Moderate depletion  Clavicle Bone Region Moderate depletion  Clavicle and Acromion Bone Region Moderate depletion  Scapular Bone Region Moderate depletion  Dorsal Hand Severe depletion  Patellar Region Severe depletion  Anterior Thigh Region Severe depletion  Posterior Calf Region Severe depletion  Edema (RD Assessment) None  Hair Reviewed  Eyes Reviewed  Mouth Reviewed  Skin Reviewed  Nails Reviewed       Diet Order:   Diet Order            Diet regular Room service appropriate? Yes; Fluid consistency: Thin  Diet effective now                 EDUCATION NEEDS:   Not appropriate for education at this time  Skin:  Skin Assessment: Skin Integrity Issues: Skin Integrity Issues:: Incisions Incisions: closed rt hip  Last BM:  Unknown  Height:   Ht Readings from Last 1 Encounters:  01/03/20 5\' 1"  (1.549 m)    Weight:   Wt Readings from Last 1 Encounters:  01/03/20 36.4 kg    Ideal Body Weight:  47.7 kg  BMI:  There is no height or weight on file to calculate BMI.  Estimated Nutritional Needs:   Kcal:  1450-1650  Protein:  70-85 grams  Fluid:  > 1.4 L    14/03/21, RD, LDN, CDCES Registered Dietitian II Certified Diabetes Care and Education Specialist Please refer to [facility] for RD and/or RD on-call/weekend/after hours pager

## 2020-01-06 NOTE — Consult Note (Signed)
Cardiology Consultation:   Patient ID: [name] MRN: [id number]; DOB: 05-09-1940  Admit date: 01/04/2020 Date of Consult: 01/06/2020  Primary Care Provider: [name], MD [facility] Cardiologist: Lance Muss, MD  [facility] Electrophysiologist:  None    Patient Profile:   [name] is a 79 y.o. female with a hx of chronic systolic and diastolic heart failure, severe TR, stroke, Alzheimer's dementia, seizure, hypertension, CKD 3 who is being seen today for the evaluation of chest pain, pre-op evaluation at the request of Dr. Everardo Pacific.  History of Present Illness:   [name] cardiac catheterization in 2010 for concern for STEMI.  Left heart cath at that time revealed no significant CAD and systolic function was normal.  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  She subsequently had an echo in 2014 at the time of her stroke and her LVEF was reduced to 35 to 40% with diffuse hypokinesis.  She last saw Dr. Arsenio Katz on 09/2019.  She previously had some lower extremity edema after a car ride that had improved by the time she saw the cardiologist.  Repeat echocardiogram revealed an improvement in her LVEF to 40 to 45%.  Her blood pressure was well controlled.  Given her frailty and dementia she was not thought to be a candidate for further invasive testing or therapies.  [name] presented to the hospital after a witnessed mechanical fall.  Cardiology was consulted for presurgical risk assessment.  However by the time we came for consultation she had already been taken to the OR.  She underwent intramedullary nailing on 12/5.  Hemoglobin this morning was 4.7 down from 9.2 on 12/5 and 11.6 on 12/4.  She also is acute renal failure with a creatinine increased to 2.0 from 1.4 yesterday.  She is currently sleeping and arouses to questions.  History taken from her daughter who is at the bedside.    Past  Medical History:  Diagnosis Date  . Abdominal pain 02/24/2013  . Acute combined systolic and diastolic congestive heart failure (HCC) 02/24/2013  . Acute on chronic renal insufficiency 05/15/2015  . Acute renal failure (HCC) 12/28/2012  . Acute renal failure superimposed on stage 3 chronic kidney disease (HCC)   . Anemia 05/20/2015  . 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 Basal ganglia infarction (HCC) 01/08/2013  . CAP (community acquired pneumonia)   . Chest pain 05/14/2015  . Chronic combined systolic and diastolic CHF (congestive heart failure) (HCC) 03/06/2014  . Congestive heart failure (CHF) (HCC)   . Constipation 02/24/2013  . Cough   . Diastolic dysfunction-grade 3 05/20/2015  . Dyspnea 02/24/2013  . Encephalopathy 12/28/2012  . Epilepsy (HCC)   . Generalized seizure (HCC) 12/28/2012  . H/O: CVA (cerebrovascular accident) 01/04/2013  . Hyperammonemia (HCC) 05/17/2015  . Hyperglycemia 02/24/2013  . Hyperkalemia 02/11/2013  . Hypertension   . 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 Hyponatremia 02/11/2013  . Influenza A 02/11/2013  . Liver enzyme elevation 02/11/2013  . Metabolic acidosis 12/28/2012  . Metabolic encephalopathy 05/16/2015  . NICM (nonischemic cardiomyopathy) -EF 25% 05/20/2015  . Pleuritic chest pain 02/24/2013  . PNA (pneumonia) 05/20/2015  . Pneumobilia 02/12/2013  . Pneumonia   . Seizure (HCC) 12/28/2012  . Seizures (HCC)   . Smoker   . Stroke (HCC)   . Troponin level elevated 05/14/2015  . Volume overload 02/24/2013  . Weight gain 02/24/2013  Past Surgical History:  Procedure Laterality Date  . NO PAST SURGERIES       Home Medications:  Prior to Admission medications   Medication Sig Start Date End Date Taking? Authorizing Provider  allopurinol (ZYLOPRIM) 100 MG tablet Take 50 mg by mouth daily.    Yes [provider]  amLODipine (NORVASC) 10 MG tablet Take 10 mg by mouth daily. 07/19/19  Yes [provider]   aspirin 325 MG tablet Take 1 tablet (325 mg total) by mouth daily. 01/03/20  Yes Lewie Chamber, MD  calcium carbonate (OSCAL) 1500 (600 Ca) MG TABS tablet Take 1,500 mg by mouth every evening.  07/26/19  Yes [provider]  carvedilol (COREG) 12.5 MG tablet Take 12.5 mg by mouth 2 (two) times daily with a meal.   Yes [provider]  donepezil (ARICEPT) 5 MG tablet Take 5 mg by mouth at bedtime. 07/02/19  Yes [provider]  FEROSUL 325 (65 Fe) MG tablet Take 325 mg by mouth every morning. 07/26/19  Yes [provider]  lacosamide (VIMPAT) 200 MG TABS tablet Take 1 tablet (200 mg total) by mouth 2 (two) times daily. 01/02/20  Yes Lewie Chamber, MD  levETIRAcetam (KEPPRA) 1000 MG tablet Take 1 tablet (1,000 mg total) by mouth 2 (two) times daily. 01/02/20  Yes Lewie Chamber, MD  losartan (COZAAR) 50 MG tablet Take 1 tablet (50 mg total) by mouth every morning. 01/03/20  Yes Lewie Chamber, MD    Inpatient Medications: Scheduled Meds: . (feeding supplement) PROSource Plus  30 mL Oral BID BM  . acetaminophen  500 mg Oral Q8H  . allopurinol  50 mg Oral Daily  . amLODipine  10 mg Oral Daily  . calcium carbonate  1,250 mg Oral QPM  . carvedilol  12.5 mg Oral BID WC  . docusate sodium  100 mg Oral BID  . donepezil  5 mg Oral QHS  . ferrous sulfate  325 mg Oral q morning - 10a  . heparin injection (subcutaneous)  5,000 Units Subcutaneous Q12H  . lacosamide  200 mg Oral BID  . levETIRAcetam  1,000 mg Oral BID  . multivitamin with minerals  1 tablet Oral Daily   Continuous Infusions:  PRN Meds: HYDROcodone-acetaminophen, menthol-cetylpyridinium **OR** phenol, metoCLOPramide **OR** metoCLOPramide (REGLAN) injection, morphine injection, ondansetron **OR** ondansetron (ZOFRAN) IV, polyethylene glycol  Allergies:   No Known Allergies  Social History:   Social History   Socioeconomic History  . Marital status: Single    Spouse name: Not on file  . Number of  children: 3  . Years of education: 68  . Highest education level: Not on file  Occupational History    Comment: Retired  Tobacco Use  . Smoking status: Current Some Day Smoker    Packs/day: 0.25    Types: Cigarettes  . Smokeless tobacco: Never Used  . Tobacco comment: 1/2 caigarettes a day  Substance and Sexual Activity  . Alcohol use: Yes    Alcohol/week: 0.0 standard drinks    Comment: occasional  . Drug use: No  . Sexual activity: Never  Other Topics Concern  . Not on file  Social History Narrative   Patient lives at home alone.    Retired.   Education one year of college.   Left handed.   Caffeine one cup of coffee daily and one cup of tea.   Social Determinants of Health   Financial Resource Strain:   . Difficulty of Paying Living Expenses: Not on file  Food Insecurity:   . Worried About Programme researcher, broadcasting/film/video in the Last Year: Not on file  . Ran Out of Food in the Last Year: Not on file  Transportation Needs:   . Lack of Transportation (Medical): Not on file  . Lack of Transportation (Non-Medical): Not on file  Physical Activity:   . Days of Exercise per Week: Not on file  . Minutes of Exercise per Session: Not on file  Stress:   . Feeling of Stress : Not on file  Social Connections:   . Frequency of Communication with Friends and Family: Not on file  . Frequency of Social Gatherings with Friends and Family: Not on file  . Attends Religious Services: Not on file  . Active Member of Clubs or Organizations: Not on file  . Attends Banker Meetings: Not on file  . Marital Status: Not on file  Intimate Partner Violence:   . Fear of Current or Ex-Partner: Not on file  . Emotionally Abused: Not on file  . Physically Abused: Not on file  . Sexually Abused: Not on file    Family History:    Family History  Problem Relation Age of Onset  . Cancer Father   . Anemia Daughter      ROS:  Please see the history of present illness.  All other ROS  reviewed and negative.     Physical Exam/Data:   Vitals:   01/06/20 0810 01/06/20 0830 01/06/20 1023 01/06/20 1134  BP: 106/64 112/61 107/63 131/79  Pulse: 83 83 77 89  Resp: Temp: 98.1 F (36.7 C) 98.2 F (36.8 C) 98.1 F (36.7 C) 98 F (36.7 C)  TempSrc: Oral Oral Oral Oral  SpO2: 99% 97% 99% 100%    Intake/Output Summary (Last 24 hours) at 01/06/2020 1301 Last data filed at 01/06/2020 1132 Gross per 24 hour  Intake 715.5 ml  Output --  Net 715.5 ml   Last 3 Weights 01/03/2020 12/27/2019 09/30/2019  Weight (lbs) 80 lb 4 oz 80 lb 4 oz 80 lb 3.2 oz  Weight (kg) 36.4 kg 36.4 kg 36.378 kg     VS:  BP 117/70   Pulse 83   Temp 98.6 F (37 C) (Oral)   Resp 18   SpO2 99%  , BMI There is no height or weight on file to calculate BMI. GENERAL:  Frail.  Chronically ill-appearing HEENT: Pupils equal round and reactive, fundi not visualized, oral mucosa unremarkable NECK:  No jugular venous distention, waveform within normal limits, carotid upstroke brisk and symmetric, no bruits LUNGS:  Clear to auscultation bilaterally HEART:  RRR.  PMI not displaced or sustained,S1 and S2 within normal limits, no S3, no S4, no clicks, no rubs, no murmurs ABD:  Flat, positive bowel sounds normal in frequency in pitch, no bruits, no rebound, no guarding, no midline pulsatile mass, no hepatomegaly, no splenomegaly EXT:  2 plus pulses throughout, no edema, no cyanosis no clubbing SKIN:  No rashes no nodules NEURO:  Cranial nerves II through XII grossly intact, motor grossly intact throughout PSYCH:  Cognitively intact, oriented to person place and time   EKG:  The EKG was personally reviewed and demonstrates:  Sinus rhythm.  Rate 78 bpm. LVH. Prior inferior infarct.  Poor R wave progression.  QTc 519 ms.    Telemetry:  Telemetry was personally reviewed and demonstrates:  Sinus rhythm.  Sinus tachycardia.   Relevant CV Studies: TTE with bubble 12/28/19: IMPRESSIONS  1. Left  ventricular ejection fraction, by estimation, is 40 to 45%. The  left ventricle has mildly decreased function. The left ventricle  demonstrates global hypokinesis. Left ventricular diastolic parameters  were normal.  2. Right ventricular systolic function is normal. The right ventricular  size is normal. There is mildly elevated pulmonary artery systolic  pressure.  3. Left atrial size was moderately dilated.  4. The mitral valve is abnormal. Trivial mitral valve regurgitation.  5. The aortic valve is tricuspid. Aortic valve regurgitation is mild.  Mild aortic valve sclerosis is present, with no evidence of aortic valve  stenosis.  6. The inferior vena cava is normal in size with greater than 50%  respiratory variability, suggesting right atrial pressure of 3 mmHg.  7. Agitated saline contrast bubble study was negative, with no evidence  of any interatrial shunt.   Laboratory Data:  High Sensitivity Troponin:   Recent Labs  Lab 12/27/19 2305 12/28/19 0524 12/28/19 1150 01/05/20 0759  TROPONINIHS 1,220* 926* 398* 51*     Chemistry Recent Labs  Lab 01/04/20 1754 01/05/20 0442 01/06/20 0310  NA 141 138 134*  K 4.7 4.9 4.7  CL 106 107 102  CO2 22 19* 19*  GLUCOSE 134* 120* 113*  BUN 31* 27* 40*  CREATININE 1.71* 1.49* 2.06*  CALCIUM 11.0* 9.6 8.1*  GFRNONAA 30* 36* 24*  ANIONGAP Recent Labs  Lab 01/04/20 1754  PROT 8.3*  ALBUMIN 4.6  AST 28  ALT 27  ALKPHOS 57  BILITOT 0.7   Hematology Recent Labs  Lab 01/04/20 1754 01/05/20 0442 01/06/20 0310  WBC 13.5* 13.1* 12.5*  RBC 3.54* 2.82* 1.47*  HGB 11.6* 9.2* 4.7*  HCT 35.6* 28.6* 14.5*  MCV 100.6* 101.4* 98.6  MCH 32.8 32.6 32.0  MCHC 32.6 32.2 32.4  RDW 17.2* 17.2* 17.2*  PLT 306 218 173   BNPNo results for input(s): BNP, PROBNP in the last 168 hours.  DDimer No results for input(s): DDIMER in the last 168 hours.   Radiology/Studies:  DG Chest 2 View  Result Date:  01/04/2020 CLINICAL DATA:  Fall, right hip pain EXAM: CHEST - 2 VIEW COMPARISON:  Radiograph 12/27/2019, 11/17/2017, CT 04/16/2012 FINDINGS: Mild cardiomegaly appearance with marked tortuosity of the brachiocephalic vasculature and a dilated and calcified tortuous aorta with some resulting rightward tracheal deviation which is overall similar appearance to prior configuration. No acute cardiomediastinal contour abnormalities are seen. No dense apical capping or other features to suggest an acute intra mediastinal process. Some chronic hyperinflation and coarsened interstitial changes are present in the lungs. No consolidation, features of edema, pneumothorax, or effusion. Interval progression of the midthoracic compression deformity is overall age indeterminate. Questionable deformity of the right radius and ulna though possibly projectional as these are incidentally included on the lateral radiograph. IMPRESSION: 1. No acute cardiopulmonary abnormality. 2. Chronic tortuosity and dilatation of aorta with rightward tracheal deviation. 3. Interval progression of a previously seen midthoracic compression deformity is overall age indeterminate. Correlate with point tenderness. 4. Questionable deformity of the radius and ulna though possibly projectional as these are incidentally included on the lateral radiograph. Laterality uncertain. Correlate with clinical symptoms and consider dedicated views as warranted. Electronically Signed   By: Kreg Shropshire M.D.   On: 01/04/2020 17:00   DG Pelvis 1-2 Views  Result Date: 01/04/2020 CLINICAL DATA:  Fall EXAM: PELVIS - 1-2 VIEW COMPARISON:  CT 04/16/2012 FINDINGS: Comminuted, mildly displaced and impacted inter trochanteric femur fracture. Additional suspicious  lucency noted in the right femoral head neck junction as well could reflect an additional fracture line. Femoral head remains normally located. Left femur is intact. Remaining bones of the pelvis are intact and  congruent though portions of the sacrum are obscured by overlying bowel gas. Transitional lumbosacral vertebrae noted incidentally. Right lateral hip swelling, possible hematoma and probable hip effusion. IMPRESSION: 1. Comminuted, mildly displaced and impacted intertrochanteric right femur fracture. 2. Additional suspicious lucency in the right femoral head neck junction could reflect an additional subcapital right femoral fracture line. 3. Right lateral hip swelling, possible hematoma and probable hip effusion. Electronically Signed   By: Kreg Shropshire M.D.   On: 01/04/2020 16:55   DG Shoulder Right  Result Date: 01/04/2020 CLINICAL DATA:  Pain after fall. EXAM: RIGHT SHOULDER - 2+ VIEW COMPARISON:  None. FINDINGS: There is a comminuted fracture of the humeral head and neck with mild displacement. No humeral head dislocation. No other acute abnormalities. IMPRESSION: Comminuted fracture of the humeral head neck with mild displacement. Electronically Signed   By: Gerome Sam III M.D   On: 01/04/2020 12:12   DG Forearm Right  Result Date: 01/04/2020 CLINICAL DATA:  Fall.  Right arm pain. EXAM: RIGHT FOREARM - 2 VIEW COMPARISON:  None. FINDINGS: No fracture or bone lesion. Elbow and wrist joints are normally spaced and aligned. Soft tissues are unremarkable. IMPRESSION: Negative. Electronically Signed   By: Amie Portland M.D.   On: 01/04/2020 18:30   CT Head Wo Contrast  Result Date: 01/04/2020 CLINICAL DATA:  Ground level fall EXAM: CT HEAD WITHOUT CONTRAST TECHNIQUE: Contiguous axial images were obtained from the base of the skull through the vertex without intravenous contrast. COMPARISON:  12/27/2019 FINDINGS: Brain: Confluent hypodensities throughout the periventricular and subcortical white matter consistent with chronic small vessel ischemic changes, stable. No signs of acute infarct or hemorrhage. Lateral ventricles and midline structures are otherwise unremarkable. No acute extra-axial fluid  collections. No mass effect. Vascular: No hyperdense vessel or unexpected calcification. Skull: Normal. Negative for fracture or focal lesion. Sinuses/Orbits: Mild chronic mucosal thickening left sphenoid sinus. Remaining sinuses are clear. Other: None. IMPRESSION: 1. Stable chronic small vessel ischemic changes. No acute intracranial process. Electronically Signed   By: Sharlet Salina M.D.   On: 01/04/2020 17:17   CT Cervical Spine Wo Contrast  Result Date: 01/04/2020 CLINICAL DATA:  Ground level fall, right arm pain EXAM: CT CERVICAL SPINE WITHOUT CONTRAST TECHNIQUE: Multidetector CT imaging of the cervical spine was performed without intravenous contrast. Multiplanar CT image reconstructions were also generated. COMPARISON:  None. FINDINGS: Alignment: Alignment is anatomic. Skull base and vertebrae: No acute fracture. No primary bone lesion or focal pathologic process. Soft tissues and spinal canal: No prevertebral fluid or swelling. No visible canal hematoma. Disc levels: Mild multilevel spondylosis greatest at C2-3, C4-5, and C5-6. Upper chest: Airway is patent.  Lung apices are clear. Other: Reconstructed images demonstrate no additional findings. IMPRESSION: 1. Mild cervical spondylosis.  No acute fractures. Electronically Signed   By: Sharlet Salina M.D.   On: 01/04/2020 17:19   CT SHOULDER RIGHT WO CONTRAST  Result Date: 01/04/2020 CLINICAL DATA:  Shoulder injury after fall EXAM: CT OF THE UPPER RIGHT EXTREMITY WITHOUT CONTRAST TECHNIQUE: Multidetector CT imaging of the upper right extremity was performed according to the standard protocol. COMPARISON:  None. FINDINGS: Bones/Joint/Cartilage Comminuted mildly impacted fracture seen through the proximal humerus involving the surgical neck, greater and lesser tuberosities. There is slight impaction of the humeral shaft on  the humeral head. The humeral head still articulates with the glenoid. No AC joint widening is seen. No other fracture is  identified. Ligaments Suboptimally assessed by CT. Muscles and Tendons There is edema seen surrounding the deltoid musculature. There is suboptimal visualization of the tendons, however the supraspinatus tendon appears to be grossly intact. Soft tissues Tissue swelling seen along the lateral aspect of the shoulder. IMPRESSION: Comminuted mildly impacted fracture seen through the surgical neck and proximal humerus with slight impaction. Overlying soft tissue swelling and deltoid musculature edema. Electronically Signed   By: Jonna Clark M.D.   On: 01/04/2020 20:37   DG Humerus Right  Result Date: 01/04/2020 CLINICAL DATA:  Fall.  Right shoulder pain. EXAM: RIGHT HUMERUS - 2+ VIEW COMPARISON:  None. FINDINGS: Comminuted fracture of the proximal humerus. There is a transverse fracture across the metaphysis with a secondary fracture across the base of the greater tuberosity. Greater tuberosity component is displaced laterally by 5 mm. No other fracture displacement. Glenohumeral and elbow joints are normally aligned as is the [facility] joint. Soft tissue swelling about the shoulder and proximal arm. IMPRESSION: 1. Mildly comminuted fracture of the proximal humerus, with mild lateral displacement of the greater tuberosity component. No other fractures and no dislocation. Electronically Signed   By: Amie Portland M.D.   On: 01/04/2020 18:29   DG C-Arm 1-60 Min  Result Date: 01/05/2020 CLINICAL DATA:  Right femoral fracture EXAM: OPERATIVE RIGHT HIP WITH PELVIS; DG C-ARM 1-60 MIN COMPARISON:  None. FLUOROSCOPY TIME:  Radiation Exposure Index (as provided by the fluoroscopic device): Not available If the device does not provide the exposure index: Fluoroscopy Time:  47 seconds Number of Acquired Images:  4 FINDINGS: Medullary rod is noted proximally. Fixation screw are seen. Fracture fragments are in near anatomic alignment. IMPRESSION: ORIF of right femoral fracture Electronically Signed   By: Alcide Clever M.D.   On:  01/05/2020 12:59   DG Hip Port Unilat With Pelvis 1V Right  Result Date: 01/05/2020 CLINICAL DATA:  Status post ORIF of right femoral fracture. EXAM: DG HIP (WITH OR WITHOUT PELVIS) 1V PORT RIGHT COMPARISON:  01/04/2020, intraoperative films from earlier in the same day. FINDINGS: Medullary rod is now noted in the proximal right femur. Fixation screws are seen. Fracture fragments are in near anatomic alignment. IMPRESSION: Status post ORIF of right femoral fracture. Electronically Signed   By: Alcide Clever M.D.   On: 01/05/2020 12:58   DG HIP OPERATIVE UNILAT W OR W/O PELVIS RIGHT  Result Date: 01/05/2020 CLINICAL DATA:  Right femoral fracture EXAM: OPERATIVE RIGHT HIP WITH PELVIS; DG C-ARM 1-60 MIN COMPARISON:  None. FLUOROSCOPY TIME:  Radiation Exposure Index (as provided by the fluoroscopic device): Not available If the device does not provide the exposure index: Fluoroscopy Time:  47 seconds Number of Acquired Images:  4 FINDINGS: Medullary rod is noted proximally. Fixation screw are seen. Fracture fragments are in near anatomic alignment. IMPRESSION: ORIF of right femoral fracture Electronically Signed   By: Alcide Clever M.D.   On: 01/05/2020 12:59   DG Femur Min 2 Views Left  Result Date: 01/04/2020 CLINICAL DATA:  Larey Seat from ground level.  Leg pain. EXAM: LEFT FEMUR 2 VIEWS COMPARISON:  06/28/2013 FINDINGS: No fracture or bone lesion. Hip and knee joints are normally spaced and aligned. There scattered femoral artery vascular calcifications. Soft tissues otherwise unremarkable. IMPRESSION: No fracture or dislocation. Electronically Signed   By: Amie Portland M.D.   On: 01/04/2020 18:28  DG Femur Min 2 Views Right  Result Date: 01/04/2020 CLINICAL DATA:  Ground level fall.  Right knee/leg pain. EXAM: RIGHT FEMUR 2 VIEWS COMPARISON:  None. FINDINGS: Non comminuted intertrochanteric fracture of the proximal right femur, with no significant displacement, but with mild varus angulation. No other  fractures. Hip and knee joints are normally aligned. Skeletal structures are demineralized. IMPRESSION: 1. Non comminuted, nondisplaced intertrochanteric fracture of the proximal right femur with mild varus angulation. No dislocation. Electronically Signed   By: Amie Portland M.D.   On: 01/04/2020 18:27     Assessment and Plan:   # Chronic systolic and diastolic heart failure:  # Essential hypertension:  LVEF 40-45% this admission.  This is improved from prior.  She is euvolemic on exam.  BP stable.  Continue amlodipine, carvedilol, and losartan is on hold. BP is low normal.   # Non-obstructive CAD:  Not an active issue.  She tolerated surgery well.  Continue aspirin.  Presume her 325mg  dose is due to prior CVA.  For cardiac prevention this could be lowered to 81mg .  Continue carvedilol.  She is not on a statin.  Check lipids.    # Anemia:  Hgb 4.7 post op today.  Down from 9.2 yesterday.  She is being transfused now.   # Unknown congenital heart disease s/p repair: No shunting or significant valvular disease noted on echo.      [facility] will sign off.   Medication Recommendations:  Resume losartan as BP and renal function allow Other recommendations (labs, testing, etc):  Check lipids Follow up as an outpatient:  As scheduled   For questions or updates, please contact [facility] Please consult [email or web address] for contact info under    Signed, Chilton Si, MD  01/06/2020 1:01 PM

## 2020-01-06 NOTE — Progress Notes (Signed)
Received critical lab result hemoglobin-4.7. Text page on call X. Blount,NP with new orders received to give 2 units PRBC.

## 2020-01-06 NOTE — Evaluation (Signed)
Occupational Therapy Evaluation Patient Details Name: [name] MRN: [id number] DOB: [birth year] Today's Date: 01/06/2020    History of Present Illness [name] is a 79 y.o. female with PMHx: heart failure, CKD 3, Alzheimer's dementia, CVA, seizure, gout, HTN, malnutrition who presents from her nursing facility following a witnessed mechanical fall.  Head CT on 12/4 was remarkable for "Stable chronic small vessel ischemic changes. No acute intracranial process. Pt with R femur fx resulting in IM nail 12/5 to R hip and R proximal humerus fx- non operative.   Clinical Impression   Pt PTA Pt came from SNF for rehab after recent hospitalization. Pt s/p fall at facility. Pt was requiring assist for ADL and mobility. Pt currently, limited due to RUE immobilized and pain; RLE painful and poor mobility. Pt requires increased assist for all ADL (minA to totalA at this time) for safety and due to increased weakness. Pt with h/o dementia; following 1 step commands with multimodal cues. Pt not initiating movement and totalA to sit EOB due to pain. Pt would benefit from continued OT skilled services for ADL, mobility and safety in SNF setting. OT following acutely.    Follow Up Recommendations  SNF;Supervision/Assistance - 24 hour    Equipment Recommendations  None recommended by OT    Recommendations for Other Services       Precautions / Restrictions Precautions Precautions: Fall Precaution Comments: seizure precautions Restrictions Weight Bearing Restrictions: Yes RUE Weight Bearing: Non weight bearing RLE Weight Bearing: Weight bearing as tolerated      Mobility Bed Mobility Overal bed mobility: Needs Assistance Bed Mobility: Supine to Sit;Sit to Supine     Supine to sit: Total assist Sit to supine: Total assist   General bed mobility comments: Pt not initiating movement and totalA to sit EOB due to pain    Transfers                 General  transfer comment: did not test as pt unable to acquire sitting balance without totalA    Balance Overall balance assessment: Needs assistance   Sitting balance-Leahy Scale: Zero Sitting balance - Comments: totalA                                   ADL either performed or assessed with clinical judgement   ADL Overall ADL's : Needs assistance/impaired Eating/Feeding: Minimal assistance;Bed level Eating/Feeding Details (indicate cue type and reason): unable to use RUE Grooming: Moderate assistance;Bed level   Upper Body Bathing: Moderate assistance;Bed level   Lower Body Bathing: Total assistance;Sitting/lateral leans;Bed level   Upper Body Dressing : Moderate assistance;Bed level   Lower Body Dressing: Total assistance   Toilet Transfer: Total assistance;+2 for physical assistance;+2 for safety/equipment Toilet Transfer Details (indicate cue type and reason): unable to attempt as pt in severe pain and did not assist with bed mobility Toileting- Clothing Manipulation and Hygiene: Total assistance;+2 for physical assistance;Bed level       Functional mobility during ADLs: Total assistance;+2 for physical assistance;+2 for safety/equipment General ADL Comments: Pt limited due to RUE immobilized and pain; RLE painful and poor mobility. Pt requires increased assist for all ADL for safety and due to increased weakness.     Vision Baseline Vision/History: Wears glasses Wears Glasses: Reading only Patient Visual Report: Other (comment) (unable to test; pt distracted) Vision Assessment?: No apparent visual deficits Additional Comments: continue to assess; difficult to pay  attention     Perception     Praxis      Pertinent Vitals/Pain Pain Assessment: Faces Faces Pain Scale: Hurts whole lot Pain Location: R shoulder and R hip Pain Descriptors / Indicators: Grimacing;Guarding Pain Intervention(s): Limited activity within patient's tolerance;Monitored during  session;Repositioned     Hand Dominance Left   Extremity/Trunk Assessment Upper Extremity Assessment Upper Extremity Assessment: Generalized weakness RUE Deficits / Details: humeral fx; in sling. wrist and hand ROM, WFLs; pain at elbow ROM   Lower Extremity Assessment Lower Extremity Assessment: Generalized weakness   Cervical / Trunk Assessment Cervical / Trunk Assessment: Kyphotic   Communication Communication Communication: Expressive difficulties   Cognition Arousal/Alertness: Awake/alert Behavior During Therapy: WFL for tasks assessed/performed Overall Cognitive Status: History of cognitive impairments - at baseline Area of Impairment: Attention;Memory;Following commands;Safety/judgement;Awareness;Problem solving                 Orientation Level: Disoriented to;Time;Situation Current Attention Level: Focused Memory: Decreased short-term memory;Decreased recall of precautions Following Commands: Follows one step commands inconsistently;Follows one step commands with increased time Safety/Judgement: Decreased awareness of safety;Decreased awareness of deficits Awareness: Intellectual Problem Solving: Slow processing;Requires verbal cues;Requires tactile cues;Difficulty sequencing General Comments: h/o dementia; following 1 step commands with multimodal cues   General Comments  Family in room, but fast asleep; unable to arouse.    Exercises     Shoulder Instructions      Home Living Family/patient expects to be discharged to:: Private residence Living Arrangements: Alone Available Help at Discharge: Family;Friend(s);Available 24 hours/day Type of Home: House Home Access: Stairs to enter [employer] of Steps: 3 Entrance Stairs-Rails: None Home Layout: Multi-level     Bathroom Shower/Tub: Chief Strategy Officer: Standard                Prior Functioning/Environment Level of Independence: Needs assistance  Gait / Transfers  Assistance Needed: Assisted; ambulating in halls often without an AD ADL's / Homemaking Assistance Needed: Requiring assist   Comments: Family sleeping in room; pt unable to relay information. Pt came from SNF for rehab.        OT Problem List: Decreased strength;Decreased range of motion;Decreased activity tolerance;Impaired balance (sitting and/or standing);Decreased coordination;Decreased cognition;Decreased safety awareness;Cardiopulmonary status limiting activity;Impaired UE functional use;Pain;Increased edema      OT Treatment/Interventions: Self-care/ADL training;Therapeutic exercise;Neuromuscular education;DME and/or AE instruction;Therapeutic activities;Cognitive remediation/compensation;Visual/perceptual remediation/compensation;Patient/family education;Balance training    OT Goals(Current goals can be found in the care plan section) Acute Rehab OT Goals Patient Stated Goal: none stated OT Goal Formulation: Patient unable to participate in goal setting Time For Goal Achievement: 01/20/20 Potential to Achieve Goals: Good ADL Goals Pt Will Perform Eating: with set-up;sitting Pt Will Perform Grooming: with set-up;sitting;bed level Pt Will Perform Upper Body Dressing: with min guard assist;sitting Pt Will Transfer to Toilet: with mod assist;stand pivot transfer;bedside commode Pt/caregiver will Perform Home Exercise Program: Increased strength;Increased ROM;Right Upper extremity;With written HEP provided;With minimal assist  OT Frequency: Min 2X/week   Barriers to D/C:            Co-evaluation              AM-PAC OT "6 Clicks" Daily Activity     Outcome Measure Help from another person eating meals?: A Little Help from another person taking care of personal grooming?: A Lot Help from another person toileting, which includes using toliet, bedpan, or urinal?: Total Help from another person bathing (including washing, rinsing, drying)?: A Lot Help from another person to  put on and taking off regular upper body clothing?: A Lot Help from another person to put on and taking off regular lower body clothing?: Total 6 Click Score: 11   End of Session Equipment Utilized During Treatment: Other (comment) (shoulder immobilizer) Nurse Communication: Mobility status  Activity Tolerance: Patient tolerated treatment well Patient left: in bed;with call bell/phone within reach;with bed alarm set;with nursing/sitter in room  OT Visit Diagnosis: Unsteadiness on feet (R26.81);Muscle weakness (generalized) (M62.81);Other symptoms and signs involving cognitive function;Pain Symptoms and signs involving cognitive functions: Other cerebrovascular disease Pain - Right/Left: Right Pain - part of body: Leg                Time: 1640-1700 OT Time Calculation (min): 20 min Charges:  OT General Charges $OT Visit: 1 Visit OT Evaluation $OT Eval Moderate Complexity: 1 Mod  Flora Lipps, OTR/L Acute Rehabilitation Services Pager: [phone] Office: [phone]   Sindia Kowalczyk  C 01/06/2020, 5:52 PM

## 2020-01-06 NOTE — Anesthesia Postprocedure Evaluation (Signed)
Anesthesia Post Note  Patient: [name]  Procedure(s) Performed: INTRAMEDULLARY (IM) NAIL INTERTROCHANTRIC (Right Hip)     Patient location during evaluation: PACU Anesthesia Type: General Level of consciousness: awake and alert Pain management: pain level controlled Vital Signs Assessment: post-procedure vital signs reviewed and stable Respiratory status: spontaneous breathing, nonlabored ventilation, respiratory function stable and patient connected to nasal cannula oxygen Cardiovascular status: blood pressure returned to baseline and stable Postop Assessment: no apparent nausea or vomiting Anesthetic complications: no   No complications documented.  Last Vitals:  Vitals:   01/06/20 0117 01/06/20 0444  BP: 110/68 120/69  Pulse: 94 80  Resp: 20 17  Temp: 36.7 C 36.8 C  SpO2: 99% 94%    Last Pain:  Vitals:   01/05/20 2053  TempSrc: Oral  PainSc:                  Mariadelcarmen Corella S

## 2020-01-06 NOTE — Progress Notes (Addendum)
Left voice message for pt son to call us back as soon as possible with regards to getting a consent for blood transfusion. Notified on coming nurse about it.

## 2020-01-06 NOTE — TOC Initial Note (Signed)
Transition of Care [facility]) - Initial/Assessment Note    Patient Details  Name: [name] MRN: [id number] Date of Birth: Jun 24, 1940  [facility] CM/SW Contact:    Cristobal Goldmann, LCSW Phone Number: 01/06/2020, 5:11 PM  Clinical Narrative:  Son Kimberlye Arnold [phone]) contacted as patient came to hospital from [place]. Son expressed concern regarding the care his mother was receiving at the facility and recounted that he was told about his mother falling while walking and her room is very close to the nurses station.  When asked, Mr. [name] reported that his mother has been to another SNF before, out [facility]. [name] indicated that his mother has been vaccinated and he has her vaccination card. When asked, son reported that his mother does wear reading glasses and does not use hearing aides. Son was provided with information regarding Medicare.gov to look up skilled nursing facilities in [facility] re: ratings and other information.         Expected Discharge Plan: Skilled Nursing Facility Barriers to Discharge: Continued Medical Work up   Patient Goals and CMS Choice Patient states their goals for this hospitalization and ongoing recovery are:: Son in asgreement with continued rehab. Would like another rehab faciity CMS Medicare.gov Compare Post Acute Care list provided to:: Patient Represenative (must comment) (Son provided with information e: [email or web address] Choice offered to / list presented to : Adult Children (Talked with son Melvie Paglia)  Expected Discharge Plan and Services Expected Discharge Plan: Skilled Nursing Facility In-house Referral: Clinical Social Work   Post Acute Care Choice: Skilled Nursing Facility Living arrangements for the past 2 months: Skilled Nursing Facility, Single Family Home (Originally from home. Discharged from hospital to SNF in Dec. 2021)                                      Prior  Living Arrangements/Services Living arrangements for the past 2 months: Skilled Nursing Facility, Single Family Home (Originally from home. Discharged from hospital to SNF in Dec. 2021) Lives with:: Self Patient language and need for interpreter reviewed:: No Do you feel safe going back to the place where you live?: No   Son agreeable with recommendation for ST rehab  Need for Family Participation in Patient Care: Yes (Comment) Care giver support system in place?: No (comment)   Criminal Activity/Legal Involvement Pertinent to Current Situation/Hospitalization: No - Comment as needed  Activities of Daily Living Home Assistive Devices/Equipment: Shower chair with back ADL Screening (condition at time of admission) Patient's cognitive ability adequate to safely complete daily activities?: No Is the patient deaf or have difficulty hearing?: No Does the patient have difficulty seeing, even when wearing glasses/contacts?: No Does the patient have difficulty concentrating, remembering, or making decisions?: Yes Patient able to express need for assistance with ADLs?: Yes Does the patient have difficulty dressing or bathing?: No Independently performs ADLs?: Yes (appropriate for developmental age) Does the patient have difficulty walking or climbing stairs?: Yes Weakness of Legs: Both Weakness of Arms/Hands: None  Permission Sought/Granted Permission sought to share information with : Other (comment) (Patient oriented to self only. Family contacted) Permission granted to share information with : No              Emotional Assessment Appearance:: Other (Comment Required (Did not visit with patient, talked with daughter by phone) Attitude/Demeanor/Rapport: Unable to Assess (Did not visit with patient, talked with  son by phone) Affect (typically observed): Unable to Assess (Did not visit with patient, talked with son by phone) Orientation: : Oriented to Self Alcohol / Substance Use: Not  Applicable Psych Involvement: No (comment)  Admission diagnosis:  Intertrochanteric fracture of right femur (HCC) [S72.141A] Fall, initial encounter [W19.XXXA] Closed fracture of head of right humerus, initial encounter [S42.291A] Closed comminuted intertrochanteric fracture of proximal end of right femur, initial encounter [facility]) [S72.141A] Patient Active Problem List   Diagnosis Date Noted  . Intertrochanteric fracture of right femur (HCC) 01/04/2020  . Humeral head fracture 01/04/2020  . Humeral surgical neck fracture 01/04/2020  . Witnessed seizure-like activity (HCC) 12/27/2019  . Chronic kidney disease, stage 3b (HCC) 12/27/2019  . Alzheimer's dementia without behavioral disturbance (HCC) 12/27/2019  . Gout 12/27/2019  . Leukocytosis 12/27/2019  . Cough   . Encounter for palliative care   . Goals of care, counseling/discussion   . Pneumonia   . NICM (nonischemic cardiomyopathy) -EF 25% 05/20/2015  . No significant CAD at cath May 2012 05/20/2015  . Anemia 05/20/2015  . Diastolic dysfunction-grade 3 05/20/2015  . Acute renal failure superimposed on stage 3 chronic kidney disease (HCC)   . CAP (community acquired pneumonia)   . Hyperammonemia (HCC) 05/17/2015  . Metabolic encephalopathy 05/16/2015  . Acute on chronic renal insufficiency 05/15/2015  . Chest pain 05/14/2015  . Troponin level elevated 05/14/2015  . Chronic combined systolic and diastolic congestive heart failure (HCC) 03/06/2014  . Acute combined systolic and diastolic congestive heart failure (HCC) 02/24/2013  . Weight gain 02/24/2013  . Volume overload 02/24/2013  . Hyperglycemia 02/24/2013  . Dyspnea 02/24/2013  . Constipation 02/24/2013  . Abdominal pain 02/24/2013  . Pleuritic chest pain 02/24/2013  . Epilepsy (HCC)   . Smoker   . 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 Protein-calorie malnutrition, severe- wgt 86 lbs 02/13/2013  . Pneumobilia 02/12/2013  . Influenza A  02/11/2013  . Hyperkalemia 02/11/2013  . Hyponatremia 02/11/2013  . HCAP (healthcare-associated pneumonia) 02/11/2013  . Liver enzyme elevation 02/11/2013  . Basal ganglia infarction (HCC) 01/08/2013  . H/O: CVA (cerebrovascular accident) 01/04/2013  . Generalized seizure (HCC) 12/28/2012  . 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 Seizure disorder (HCC) 12/28/2012  . Metabolic acidosis 12/28/2012  . Encephalopathy 12/28/2012  . Acute renal failure (HCC) 12/28/2012   PCP:  [name], MD Pharmacy:   [facility] [phone] - [place], [place] - [address] AT [facility] [address] [place] [address] Phone: [phone] Fax: [phone]  [facility] - [place], [place] - [address] [place] [address] Phone: [phone] Fax: [phone]     Social Determinants of Health (SDOH) Interventions  No SDOH interventions requested or needed at this time.  Readmission Risk Interventions No flowsheet data found.

## 2020-01-06 NOTE — Progress Notes (Signed)
Patient's IV occluded during blood administration. I tried 2 times to obtain a new IV without success. IV team able to come and start new IV. Asked to finish transfusion by MD. Transfusion restarted. Due to finish by 1745.

## 2020-01-06 NOTE — Progress Notes (Signed)
   ORTHOPAEDIC PROGRESS NOTE  s/p Procedure(s): INTRAMEDULLARY (IM) NAIL INTERTROCHANTRIC on 01/05/2020 with Dr. Everardo Pacific  SUBJECTIVE: Reports some pain in her right leg and right arm. Hgb this morning 4.7.   OBJECTIVE: PE: General: laying in hospital bed, NAD Right upper extremity: tender to palpation right shoulder. ROM not tested in setting of known fracture. Wiggles her fingers. Endorses distal sensation. Warm well perfused hand. Sling in place. Right lower extremity: dressings CDI. Tender to palpation right hip and knee. Wiggles her toes. Endorses distal sensation. Warm well perfused foot.   Vitals:   01/06/20 0117 01/06/20 0444  BP: 110/68 120/69  Pulse: 94 80  Resp: 20 17  Temp: 98.1 F (36.7 C) 98.3 F (36.8 C)  SpO2: 99% 94%     ASSESSMENT: [name] is a 79 y.o. female   1. Right proximal humerus fracture - non operative management  2. Right femur fracture - POD #1  PLAN: Weightbearing:   - RUE: NWB in sling  - RLE: WBAT  Insicional and dressing care:   - RLE: Reinforce dressings as needed Orthopedic device(s):   - RUE: sling Showering: Post-op day #2 with assistance VTE prophylaxis:  Will start on Heparin 5000 units every 12 hours when Hgb stable as recommended by pharmacy. Hgb 4.7 this morning. Primary team placed orders for PRBC. Pain control: PRN pain medications, preferring oral medications. Minimize narcotics as able.  Follow - up plan: 2 weeks in office with Dr. Salomon Fick information: Dr. Ramond Marrow, Alfonse Alpers PA-C, After hours and holidays please check [email or web address] for group call information for [facility] Dispo: TBD. Will likely need SNF. PT/OT evals pending. TOC consult placed.   Alfonse Alpers, PA-C 01/06/2020

## 2020-01-06 NOTE — Assessment & Plan Note (Signed)
Clinically compensated.

## 2020-01-06 NOTE — Evaluation (Signed)
Clinical/Bedside Swallow Evaluation Patient Details  Name: [name] MRN: [id number] Date of Birth: Oct 29, 1940  Today's Date: 01/06/2020 Time: SLP Start Time (ACUTE ONLY): 0950 SLP Stop Time (ACUTE ONLY): 1002 SLP Time Calculation (min) (ACUTE ONLY): 12 min  Past Medical History:  Past Medical History:  Diagnosis Date  . Abdominal pain 02/24/2013  . Acute combined systolic and diastolic congestive heart failure (HCC) 02/24/2013  . Acute on chronic renal insufficiency 05/15/2015  . Acute renal failure (HCC) 12/28/2012  . Acute renal failure superimposed on stage 3 chronic kidney disease (HCC)   . Anemia 05/20/2015  . 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 Basal ganglia infarction (HCC) 01/08/2013  . CAP (community acquired pneumonia)   . Chest pain 05/14/2015  . Chronic combined systolic and diastolic CHF (congestive heart failure) (HCC) 03/06/2014  . Congestive heart failure (CHF) (HCC)   . Constipation 02/24/2013  . Cough   . Diastolic dysfunction-grade 3 05/20/2015  . Dyspnea 02/24/2013  . Encephalopathy 12/28/2012  . Epilepsy (HCC)   . Generalized seizure (HCC) 12/28/2012  . H/O: CVA (cerebrovascular accident) 01/04/2013  . Hyperammonemia (HCC) 05/17/2015  . Hyperglycemia 02/24/2013  . Hyperkalemia 02/11/2013  . Hypertension   . 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 Hyponatremia 02/11/2013  . Influenza A 02/11/2013  . Liver enzyme elevation 02/11/2013  . Metabolic acidosis 12/28/2012  . Metabolic encephalopathy 05/16/2015  . NICM (nonischemic cardiomyopathy) -EF 25% 05/20/2015  . Pleuritic chest pain 02/24/2013  . PNA (pneumonia) 05/20/2015  . Pneumobilia 02/12/2013  . Pneumonia   . Seizure (HCC) 12/28/2012  . Seizures (HCC)   . Smoker   . Stroke (HCC)   . Troponin level elevated 05/14/2015  . Volume overload 02/24/2013  . Weight gain 02/24/2013   Past Surgical History:  Past Surgical History:  Procedure Laterality Date  . NO PAST  SURGERIES     HPI:  [name] is a 79 y.o. female with medical history significant of heart failure, CKD 3, Alzheimer's dementia, CVA, seizure, gout, hypertension, malnutrition who presents from her nursing facility following a witnessed mechanical fall.  Head CT on 12/4 was remarkable for "Stable chronic small vessel ischemic changes. No acute intracranial process."  Pt was recently seen by ST in November of 2021 for dysphagia management and aphasia tx.  Most recent diet recommendations were for regular solids and thin liquids.    Assessment / Plan / Recommendation Clinical Impression  Pt was seen for a bedside swallow evaluation and she presents with suspected functional oropharyngeal swallowing abilities.  Pt was encountered awake/alert and she was pleasant and cooperative throughout this session.  Oral mechanism exam was remarkable for missing dentition on the bottom and edentulism on the top.  Pt reported that she has top dentures, but that she does not use them for PO intake.  Pt consumed trials of thin liquid, puree, and regular solids.  Pt fed herself independently given set up and she demonstrated good bolus acceptance, suspected timely AP transport/swallow initiation, and consistent hyolaryngeal elevation/excursion to observation and palpation.  Mastication of regular solids was mildly prolonged secondary to dentition, but it was functional and no oral residue was observed.  No overt s/sx of aspiration were observed with any trials.  Recommend continuation of regular solids and thin liquids with medications administered whole with thin liquid.  No further skilled ST is warranted at this time.  Please re-consult if additional needs arise.    SLP Visit Diagnosis: Dysphagia, unspecified (R13.10);Aphasia (R47.01)  Aspiration Risk  Mild aspiration risk    Diet Recommendation Regular;Thin liquid   Liquid Administration via: Cup;Straw Medication Administration: Whole meds with  liquid Supervision: Staff to assist with self feeding Compensations: Slow rate;Small sips/bites    Other  Recommendations Oral Care Recommendations: Oral care BID   Follow up Recommendations None        Swallow Study   General HPI: [name] is a 79 y.o. female with medical history significant of heart failure, CKD 3, Alzheimer's dementia, CVA, seizure, gout, hypertension, malnutrition who presents from her nursing facility following a witnessed mechanical fall.  Head CT on 12/4 was remarkable for "Stable chronic small vessel ischemic changes. No acute intracranial process."  Pt was recently seen by ST in November of 2021 for dysphagia management and aphasia tx.  Most recent diet recommendations were for regular solids and thin liquids.  Type of Study: Bedside Swallow Evaluation Previous Swallow Assessment: BSE on 12/30/19 Diet Prior to this Study: Regular;Thin liquids Temperature Spikes Noted: No Respiratory Status: Room air History of Recent Intubation: Yes Length of Intubations (days):  (less than 1 for procedure ) Date extubated: 01/05/20 Behavior/Cognition: Alert;Cooperative;Pleasant mood Oral Cavity Assessment: Within Functional Limits Oral Care Completed by SLP: No Oral Cavity - Dentition: Missing dentition;Dentures, not available Vision: Functional for self-feeding Self-Feeding Abilities: Needs set up;Able to feed self Patient Positioning: Upright in bed Baseline Vocal Quality: Normal Volitional Swallow: Able to elicit    Oral/Motor/Sensory Function Overall Oral Motor/Sensory Function: Within functional limits   Ice Chips Ice chips: Not tested   Thin Liquid Thin Liquid: Within functional limits    Nectar Thick Nectar Thick Liquid: Not tested   Honey Thick Honey Thick Liquid: Not tested   Puree Puree: Within functional limits   Solid     Solid: Within functional limits     Villa Herb., M.S., CCC-SLP Acute Rehabilitation Services Office: [phone]  Shanon Rosser Quinn Quam 01/06/2020,10:11 AM

## 2020-01-06 NOTE — Progress Notes (Signed)
PT Cancellation Note  Patient Details Name: [name] MRN: [id number] DOB: January 27, 1941   Cancelled Treatment:    Reason Eval/Treat Not Completed: Patient not medically ready   Noted critically low Hgb, with orders to transfuse;   Will follow up later today as time allows;  Otherwise, will follow up for PT tomorrow;   Thank you,  Van Clines, PT  Acute Rehabilitation Services Pager [phone] Office [phone]     [name] 01/06/2020, 9:30 AM

## 2020-01-06 NOTE — Progress Notes (Signed)
PROGRESS NOTE    [name]  ZOX:[id number] DOB: Jun 03, 1940 DOA: 01/04/2020 PCP: [name], MD    Brief Narrative:  79 year old female with history of chronic pulmonary failure, stage III chronic kidney disease, Alzheimer's dementia, history of stroke, seizures and hypertension brought from nursing home with witnessed fall.  Apparently she was walking on the hallway and seen falling on her right side.  In the emergency room, hemodynamically stable.  Found to have right shoulder fracture and right intertrochanteric fracture of the femur.  She was recently admitted to the hospital and discharged on 12/2 to a skilled nursing facility after adjustment of her seizure medications.   Assessment & Plan:   Principal Problem:   Intertrochanteric fracture of right femur (HCC) Active Problems:   Seizure disorder (HCC)   H/O: CVA (cerebrovascular accident)   Protein-calorie malnutrition, severe- wgt 86 lbs   Chronic combined systolic and diastolic congestive heart failure (HCC)   Acute on chronic renal insufficiency   Alzheimer's dementia without behavioral disturbance (HCC)   Gout   Humeral head fracture   Humeral surgical neck fracture  Intercurrently fracture right femur: Status post IM nail 12/5 Weightbearing as tolerated Adequate opiates for pain relief Mobility with PT OT Chemical DVT prophylaxis on hold due to severe anemia Postop management as per surgery  Right neck of humerus fracture: Nonweightbearing Sling Conservative management  Acute kidney injury on CKD stage IIIb: Baseline creatinine about 1.4.  Volume expansion.  Holding ARB.  Recheck renal function tomorrow morning.  Chest pain/abnormal EKG: Cardiology consulted.  No intervention needed.  Currently chest pain-free.  Seizure disorder: Recently admitted for seizure and encephalopathy.  Currently on increased dose of house medicine including Vimpat 200 mg twice a day, Keppra 1000 mg twice daily.  No  recurrent seizure.  Alzheimer's dementia: Continue home Aricept.  Delirium precautions.  Chronic combined heart failure: Euvolemic.  Hypertension: On Coreg and amlodipine.  Holding ARB in the setting of AKI.  Acute blood loss anemia: Suspect anticipated blood loss from surgery, anticipated blood loss from long bone fractures.  Hemoglobin less than 5 is normal and.  She is getting 2 units of PRBC transfusion.  We will closely monitor.  Holding chemical prophylaxis until hemoglobin improves.   DVT prophylaxis: heparin injection 5,000 Units Start: 01/06/20 1800 SCDs Start: 01/05/20 2048 SCDs Start: 01/04/20 2035   Code Status: Full code Eliquis Family Communication: None Disposition Plan: Status is: Inpatient  Remains inpatient appropriate because:Inpatient level of care appropriate due to severity of illness   Dispo: The patient is from: SNF              Anticipated d/c is to: SNF              Anticipated d/c date is: 2 days              Patient currently is not medically stable to d/c.         Consultants:   Orthopedics  Procedures:   IM nail 12/5 right femur  Antimicrobials:   None   Subjective: Patient seen and examined.  Overnight events noted.  She remains mostly quiet.  She is pleasantly confused.  She thinks she is in [place].  She has no insight into her problems.  Denies any neck pain.  She had some right shoulder pain.  Objective: Vitals:   01/06/20 1023 01/06/20 1134 01/06/20 1338 01/06/20 1400  BP: 107/63 131/79 116/84 117/70  Pulse: 77 89 86 83  Resp: 18  16 18  Temp: 98.1 F (36.7 C) 98 F (36.7 C) 99.2 F (37.3 C) 98.6 F (37 C)  TempSrc: Oral Oral Oral Oral  SpO2: 99% 100% 99% 99%    Intake/Output Summary (Last 24 hours) at 01/06/2020 1527 Last data filed at 01/06/2020 1300 Gross per 24 hour  Intake 835.5 ml  Output --  Net 835.5 ml   There were no vitals filed for this visit.  Examination:  General exam: Appears calm and  comfortable  Pleasant and confused. Respiratory system: Clear to auscultation. Respiratory effort normal.  No added sounds. Cardiovascular system: S1 & S2 heard, RRR.  Gastrointestinal system: Abdomen is nondistended, soft and nontender. No organomegaly or masses felt. Normal bowel sounds heard. Central nervous system: Alert and awake.  Oriented x0. Right arm on sling.  Distal neurovascular status intact.  Pain on mobility. Right thigh incision clean and dry.  Distal neurovascular status intact.     Data Reviewed: I have personally reviewed following labs and imaging studies  CBC: Recent Labs  Lab 01/04/20 1754 01/05/20 0442 01/06/20 0310  WBC 13.5* 13.1* 12.5*  NEUTROABS 11.6*  --  9.8*  HGB 11.6* 9.2* 4.7*  HCT 35.6* 28.6* 14.5*  MCV 100.6* 101.4* 98.6  PLT 306 218 173   Basic Metabolic Panel: Recent Labs  Lab 12/31/19 0412 12/31/19 0412 01/01/20 0307 01/02/20 0453 01/04/20 1754 01/05/20 0442 01/06/20 0310  NA 142   < > 142 141 141 138 134*  K 3.6   < > 4.0 4.2 4.7 4.9 4.7  CL 109   < > 112* 107 106 107 102  CO2 20*   < > 18* 20* 22 19* 19*  GLUCOSE 104*   < > 107* 94 134* 120* 113*  BUN 16   < > 20 19 31* 27* 40*  CREATININE 1.39*   < > 1.59* 1.38* 1.71* 1.49* 2.06*  CALCIUM 9.2   < > 9.3 9.6 11.0* 9.6 8.1*  MG 1.7  --   --   --   --   --   --    < > = values in this interval not displayed.   GFR: Estimated Creatinine Clearance: 13.1 mL/min (A) (by C-G formula based on SCr of 2.06 mg/dL (H)). Liver Function Tests: Recent Labs  Lab 01/04/20 1754  AST 28  ALT 27  ALKPHOS 57  BILITOT 0.7  PROT 8.3*  ALBUMIN 4.6   No results for input(s): LIPASE, AMYLASE in the last 168 hours. Recent Labs  Lab 01/04/20 1929  AMMONIA 15   Coagulation Profile: Recent Labs  Lab 01/04/20 1754  INR 1.0   Cardiac Enzymes: No results for input(s): CKTOTAL, CKMB, CKMBINDEX, TROPONINI in the last 168 hours. BNP (last 3 results) No results for input(s): PROBNP in the  last 8760 hours. HbA1C: No results for input(s): HGBA1C in the last 72 hours. CBG: Recent Labs  Lab 01/02/20 0458 01/02/20 0811 01/02/20 1124 01/02/20 1601 01/04/20 1750  GLUCAP 97 103* 121* 99 122*   Lipid Profile: No results for input(s): CHOL, HDL, LDLCALC, TRIG, CHOLHDL, LDLDIRECT in the last 72 hours. Thyroid Function Tests: No results for input(s): TSH, T4TOTAL, FREET4, T3FREE, THYROIDAB in the last 72 hours. Anemia Panel: No results for input(s): VITAMINB12, FOLATE, FERRITIN, TIBC, IRON, RETICCTPCT in the last 72 hours. Sepsis Labs: No results for input(s): PROCALCITON, LATICACIDVEN in the last 168 hours.  Recent Results (from the past 240 hour(s))  SARS Coronavirus 2 by RT PCR (hospital order, performed in [facility] hospital lab) Nasopharyngeal Nasopharyngeal Swab     Status: None   Collection Time: 01/02/20  8:41 AM   Specimen: Nasopharyngeal Swab  Result Value Ref Range Status   SARS Coronavirus 2 NEGATIVE NEGATIVE Final    Comment: (NOTE) SARS-CoV-2 target nucleic acids are NOT DETECTED.  The SARS-CoV-2 RNA is generally detectable in upper and lower respiratory specimens during the acute phase of infection. The lowest concentration of SARS-CoV-2 viral copies this assay can detect is 250 copies / mL. A negative result does not preclude SARS-CoV-2 infection and should not be used as the sole basis for treatment or other patient management decisions.  A negative result may occur with improper specimen collection / handling, submission of specimen other than nasopharyngeal swab, presence of viral mutation(s) within the areas targeted by this assay, and inadequate number of viral copies (<250 copies / mL). A negative result must be combined with clinical observations, patient history, and epidemiological information.  Fact Sheet for Patients:   BoilerBrush.com.cy  Fact Sheet for Healthcare  Providers: https://pope.com/  This test is not yet approved or  cleared by the [place] FDA and has been authorized for detection and/or diagnosis of SARS-CoV-2 by FDA under an Emergency Use Authorization (EUA).  This EUA will remain in effect (meaning this test can be used) for the duration of the COVID-19 declaration under Section 564(b)(1) of the Act, 21 U.S.C. section 360bbb-3(b)(1), unless the authorization is terminated or revoked sooner.  Performed at [facility], [address] [place], [place] [address]   Resp Panel by RT-PCR (Flu A&B, Covid) Nasopharyngeal Swab     Status: None   Collection Time: 01/04/20  7:25 PM   Specimen: Nasopharyngeal Swab; Nasopharyngeal(NP) swabs in vial transport medium  Result Value Ref Range Status   SARS Coronavirus 2 by RT PCR NEGATIVE NEGATIVE Final    Comment: (NOTE) SARS-CoV-2 target nucleic acids are NOT DETECTED.  The SARS-CoV-2 RNA is generally detectable in upper respiratory specimens during the acute phase of infection. The lowest concentration of SARS-CoV-2 viral copies this assay can detect is 138 copies/mL. A negative result does not preclude SARS-Cov-2 infection and should not be used as the sole basis for treatment or other patient management decisions. A negative result may occur with  improper specimen collection/handling, submission of specimen other than nasopharyngeal swab, presence of viral mutation(s) within the areas targeted by this assay, and inadequate number of viral copies(<138 copies/mL). A negative result must be combined with clinical observations, patient history, and epidemiological information. The expected result is Negative.  Fact Sheet for Patients:  BloggerCourse.com  Fact Sheet for Healthcare Providers:  SeriousBroker.it  This test is no t yet approved or cleared by the [place] FDA and  has been authorized  for detection and/or diagnosis of SARS-CoV-2 by FDA under an Emergency Use Authorization (EUA). This EUA will remain  in effect (meaning this test can be used) for the duration of the COVID-19 declaration under Section 564(b)(1) of the Act, 21 U.S.C.section 360bbb-3(b)(1), unless the authorization is terminated  or revoked sooner.       Influenza A by PCR NEGATIVE NEGATIVE Final   Influenza B by PCR NEGATIVE NEGATIVE Final    Comment: (NOTE) The Xpert Xpress SARS-CoV-2/FLU/RSV plus assay is intended as an aid in the diagnosis of influenza from Nasopharyngeal swab specimens and should not be used as a sole basis for treatment. Nasal washings and aspirates are unacceptable for Xpert Xpress SARS-CoV-2/FLU/RSV testing.  Fact Sheet for  Patients: BloggerCourse.com  Fact Sheet for Healthcare Providers: SeriousBroker.it  This test is not yet approved or cleared by the [place] FDA and has been authorized for detection and/or diagnosis of SARS-CoV-2 by FDA under an Emergency Use Authorization (EUA). This EUA will remain in effect (meaning this test can be used) for the duration of the COVID-19 declaration under Section 564(b)(1) of the Act, 21 U.S.C. section 360bbb-3(b)(1), unless the authorization is terminated or revoked.  Performed at [facility], [address], [place], [place] [address]   Surgical pcr screen     Status: None   Collection Time: 01/05/20 11:02 AM   Specimen: Nasal Mucosa; Nasal Swab  Result Value Ref Range Status   MRSA, PCR NEGATIVE NEGATIVE Final   Staphylococcus aureus NEGATIVE NEGATIVE Final    Comment: (NOTE) The Xpert SA Assay (FDA approved for NASAL specimens in patients 10 years of age and older), is one component of a comprehensive surveillance program. It is not intended to diagnose infection nor to guide or monitor treatment. Performed at [facility], [address]  [place], [place] [address]          Radiology Studies: DG Chest 2 View  Result Date: 01/04/2020 CLINICAL DATA:  Fall, right hip pain EXAM: CHEST - 2 VIEW COMPARISON:  Radiograph 12/27/2019, 11/17/2017, CT 04/16/2012 FINDINGS: Mild cardiomegaly appearance with marked tortuosity of the brachiocephalic vasculature and a dilated and calcified tortuous aorta with some resulting rightward tracheal deviation which is overall similar appearance to prior configuration. No acute cardiomediastinal contour abnormalities are seen. No dense apical capping or other features to suggest an acute intra mediastinal process. Some chronic hyperinflation and coarsened interstitial changes are present in the lungs. No consolidation, features of edema, pneumothorax, or effusion. Interval progression of the midthoracic compression deformity is overall age indeterminate. Questionable deformity of the right radius and ulna though possibly projectional as these are incidentally included on the lateral radiograph. IMPRESSION: 1. No acute cardiopulmonary abnormality. 2. Chronic tortuosity and dilatation of aorta with rightward tracheal deviation. 3. Interval progression of a previously seen midthoracic compression deformity is overall age indeterminate. Correlate with point tenderness. 4. Questionable deformity of the radius and ulna though possibly projectional as these are incidentally included on the lateral radiograph. Laterality uncertain. Correlate with clinical symptoms and consider dedicated views as warranted. Electronically Signed   By: Kreg Shropshire M.D.   On: 01/04/2020 17:00   DG Pelvis 1-2 Views  Result Date: 01/04/2020 CLINICAL DATA:  Fall EXAM: PELVIS - 1-2 VIEW COMPARISON:  CT 04/16/2012 FINDINGS: Comminuted, mildly displaced and impacted inter trochanteric femur fracture. Additional suspicious lucency noted in the right femoral head neck junction as well could reflect an additional fracture line. Femoral head remains  normally located. Left femur is intact. Remaining bones of the pelvis are intact and congruent though portions of the sacrum are obscured by overlying bowel gas. Transitional lumbosacral vertebrae noted incidentally. Right lateral hip swelling, possible hematoma and probable hip effusion. IMPRESSION: 1. Comminuted, mildly displaced and impacted intertrochanteric right femur fracture. 2. Additional suspicious lucency in the right femoral head neck junction could reflect an additional subcapital right femoral fracture line. 3. Right lateral hip swelling, possible hematoma and probable hip effusion. Electronically Signed   By: Kreg Shropshire M.D.   On: 01/04/2020 16:55   DG Forearm Right  Result Date: 01/04/2020 CLINICAL DATA:  Fall.  Right arm pain. EXAM: RIGHT FOREARM - 2 VIEW COMPARISON:  None. FINDINGS: No fracture or bone  lesion. Elbow and wrist joints are normally spaced and aligned. Soft tissues are unremarkable. IMPRESSION: Negative. Electronically Signed   By: Amie Portland M.D.   On: 01/04/2020 18:30   CT Head Wo Contrast  Result Date: 01/04/2020 CLINICAL DATA:  Ground level fall EXAM: CT HEAD WITHOUT CONTRAST TECHNIQUE: Contiguous axial images were obtained from the base of the skull through the vertex without intravenous contrast. COMPARISON:  12/27/2019 FINDINGS: Brain: Confluent hypodensities throughout the periventricular and subcortical white matter consistent with chronic small vessel ischemic changes, stable. No signs of acute infarct or hemorrhage. Lateral ventricles and midline structures are otherwise unremarkable. No acute extra-axial fluid collections. No mass effect. Vascular: No hyperdense vessel or unexpected calcification. Skull: Normal. Negative for fracture or focal lesion. Sinuses/Orbits: Mild chronic mucosal thickening left sphenoid sinus. Remaining sinuses are clear. Other: None. IMPRESSION: 1. Stable chronic small vessel ischemic changes. No acute intracranial process.  Electronically Signed   By: Sharlet Salina M.D.   On: 01/04/2020 17:17   CT Cervical Spine Wo Contrast  Result Date: 01/04/2020 CLINICAL DATA:  Ground level fall, right arm pain EXAM: CT CERVICAL SPINE WITHOUT CONTRAST TECHNIQUE: Multidetector CT imaging of the cervical spine was performed without intravenous contrast. Multiplanar CT image reconstructions were also generated. COMPARISON:  None. FINDINGS: Alignment: Alignment is anatomic. Skull base and vertebrae: No acute fracture. No primary bone lesion or focal pathologic process. Soft tissues and spinal canal: No prevertebral fluid or swelling. No visible canal hematoma. Disc levels: Mild multilevel spondylosis greatest at C2-3, C4-5, and C5-6. Upper chest: Airway is patent.  Lung apices are clear. Other: Reconstructed images demonstrate no additional findings. IMPRESSION: 1. Mild cervical spondylosis.  No acute fractures. Electronically Signed   By: Sharlet Salina M.D.   On: 01/04/2020 17:19   CT SHOULDER RIGHT WO CONTRAST  Result Date: 01/04/2020 CLINICAL DATA:  Shoulder injury after fall EXAM: CT OF THE UPPER RIGHT EXTREMITY WITHOUT CONTRAST TECHNIQUE: Multidetector CT imaging of the upper right extremity was performed according to the standard protocol. COMPARISON:  None. FINDINGS: Bones/Joint/Cartilage Comminuted mildly impacted fracture seen through the proximal humerus involving the surgical neck, greater and lesser tuberosities. There is slight impaction of the humeral shaft on the humeral head. The humeral head still articulates with the glenoid. No AC joint widening is seen. No other fracture is identified. Ligaments Suboptimally assessed by CT. Muscles and Tendons There is edema seen surrounding the deltoid musculature. There is suboptimal visualization of the tendons, however the supraspinatus tendon appears to be grossly intact. Soft tissues Tissue swelling seen along the lateral aspect of the shoulder. IMPRESSION: Comminuted mildly impacted  fracture seen through the surgical neck and proximal humerus with slight impaction. Overlying soft tissue swelling and deltoid musculature edema. Electronically Signed   By: Jonna Clark M.D.   On: 01/04/2020 20:37   DG Humerus Right  Result Date: 01/04/2020 CLINICAL DATA:  Fall.  Right shoulder pain. EXAM: RIGHT HUMERUS - 2+ VIEW COMPARISON:  None. FINDINGS: Comminuted fracture of the proximal humerus. There is a transverse fracture across the metaphysis with a secondary fracture across the base of the greater tuberosity. Greater tuberosity component is displaced laterally by 5 mm. No other fracture displacement. Glenohumeral and elbow joints are normally aligned as is the [facility] joint. Soft tissue swelling about the shoulder and proximal arm. IMPRESSION: 1. Mildly comminuted fracture of the proximal humerus, with mild lateral displacement of the greater tuberosity component. No other fractures and no dislocation. Electronically Signed   By: Onalee Hua  Ormond M.D.   On: 01/04/2020 18:29   DG C-Arm 1-60 Min  Result Date: 01/05/2020 CLINICAL DATA:  Right femoral fracture EXAM: OPERATIVE RIGHT HIP WITH PELVIS; DG C-ARM 1-60 MIN COMPARISON:  None. FLUOROSCOPY TIME:  Radiation Exposure Index (as provided by the fluoroscopic device): Not available If the device does not provide the exposure index: Fluoroscopy Time:  47 seconds Number of Acquired Images:  4 FINDINGS: Medullary rod is noted proximally. Fixation screw are seen. Fracture fragments are in near anatomic alignment. IMPRESSION: ORIF of right femoral fracture Electronically Signed   By: Alcide Clever M.D.   On: 01/05/2020 12:59   DG Hip Port Unilat With Pelvis 1V Right  Result Date: 01/05/2020 CLINICAL DATA:  Status post ORIF of right femoral fracture. EXAM: DG HIP (WITH OR WITHOUT PELVIS) 1V PORT RIGHT COMPARISON:  01/04/2020, intraoperative films from earlier in the same day. FINDINGS: Medullary rod is now noted in the proximal right femur. Fixation screws  are seen. Fracture fragments are in near anatomic alignment. IMPRESSION: Status post ORIF of right femoral fracture. Electronically Signed   By: Alcide Clever M.D.   On: 01/05/2020 12:58   DG HIP OPERATIVE UNILAT W OR W/O PELVIS RIGHT  Result Date: 01/05/2020 CLINICAL DATA:  Right femoral fracture EXAM: OPERATIVE RIGHT HIP WITH PELVIS; DG C-ARM 1-60 MIN COMPARISON:  None. FLUOROSCOPY TIME:  Radiation Exposure Index (as provided by the fluoroscopic device): Not available If the device does not provide the exposure index: Fluoroscopy Time:  47 seconds Number of Acquired Images:  4 FINDINGS: Medullary rod is noted proximally. Fixation screw are seen. Fracture fragments are in near anatomic alignment. IMPRESSION: ORIF of right femoral fracture Electronically Signed   By: Alcide Clever M.D.   On: 01/05/2020 12:59   DG Femur Min 2 Views Left  Result Date: 01/04/2020 CLINICAL DATA:  Larey Seat from ground level.  Leg pain. EXAM: LEFT FEMUR 2 VIEWS COMPARISON:  06/28/2013 FINDINGS: No fracture or bone lesion. Hip and knee joints are normally spaced and aligned. There scattered femoral artery vascular calcifications. Soft tissues otherwise unremarkable. IMPRESSION: No fracture or dislocation. Electronically Signed   By: Amie Portland M.D.   On: 01/04/2020 18:28   DG Femur Min 2 Views Right  Result Date: 01/04/2020 CLINICAL DATA:  Ground level fall.  Right knee/leg pain. EXAM: RIGHT FEMUR 2 VIEWS COMPARISON:  None. FINDINGS: Non comminuted intertrochanteric fracture of the proximal right femur, with no significant displacement, but with mild varus angulation. No other fractures. Hip and knee joints are normally aligned. Skeletal structures are demineralized. IMPRESSION: 1. Non comminuted, nondisplaced intertrochanteric fracture of the proximal right femur with mild varus angulation. No dislocation. Electronically Signed   By: Amie Portland M.D.   On: 01/04/2020 18:27        Scheduled Meds: . (feeding supplement)  PROSource Plus  30 mL Oral BID BM  . acetaminophen  500 mg Oral Q8H  . allopurinol  50 mg Oral Daily  . amLODipine  10 mg Oral Daily  . calcium carbonate  1,250 mg Oral QPM  . carvedilol  12.5 mg Oral BID WC  . docusate sodium  100 mg Oral BID  . donepezil  5 mg Oral QHS  . ferrous sulfate  325 mg Oral q morning - 10a  . heparin injection (subcutaneous)  5,000 Units Subcutaneous Q12H  . lacosamide  200 mg Oral BID  . levETIRAcetam  1,000 mg Oral BID  . multivitamin with minerals  1 tablet Oral Daily  Continuous Infusions:   LOS: 2 days    Time spent: 35 minutes    Dorcas Carrow, MD [facility] Pager [phone]

## 2020-01-07 ENCOUNTER — Encounter (HOSPITAL_COMMUNITY): Payer: Self-pay | Admitting: Orthopaedic Surgery

## 2020-01-07 LAB — BASIC METABOLIC PANEL
Anion gap: 11 (ref 5–15)
BUN: 39 mg/dL — ABNORMAL HIGH (ref 8–23)
CO2: 22 mmol/L (ref 22–32)
Calcium: 8.5 mg/dL — ABNORMAL LOW (ref 8.9–10.3)
Chloride: 103 mmol/L (ref 98–111)
Creatinine, Ser: 1.93 mg/dL — ABNORMAL HIGH (ref 0.44–1.00)
GFR, Estimated: 26 mL/min — ABNORMAL LOW (ref >60)
Glucose, Bld: 103 mg/dL — ABNORMAL HIGH (ref 70–99)
Potassium: 4.4 mmol/L (ref 3.5–5.1)
Sodium: 136 mmol/L (ref 135–145)

## 2020-01-07 LAB — TYPE AND SCREEN
ABO/RH(D): A POS
Antibody Screen: NEGATIVE
Unit division: 0
Unit division: 0

## 2020-01-07 LAB — LIPID PANEL
Cholesterol: 135 mg/dL (ref 0–200)
HDL: 41 mg/dL (ref >40)
LDL Cholesterol: 45 mg/dL (ref 0–99)
Total CHOL/HDL Ratio: 3.3 RATIO
Triglycerides: 247 mg/dL — ABNORMAL HIGH (ref <150)
VLDL: 49 mg/dL — ABNORMAL HIGH (ref 0–40)

## 2020-01-07 LAB — CBC
HCT: 26.7 % — ABNORMAL LOW (ref 36.0–46.0)
Hemoglobin: 9.3 g/dL — ABNORMAL LOW (ref 12.0–15.0)
MCH: 30.2 pg (ref 26.0–34.0)
MCHC: 34.8 g/dL (ref 30.0–36.0)
MCV: 86.7 fL (ref 80.0–100.0)
Platelets: 175 10*3/uL (ref 150–400)
RBC: 3.08 MIL/uL — ABNORMAL LOW (ref 3.87–5.11)
RDW: 19.3 % — ABNORMAL HIGH (ref 11.5–15.5)
WBC: 12.7 10*3/uL — ABNORMAL HIGH (ref 4.0–10.5)
nRBC: 0.2 % (ref 0.0–0.2)

## 2020-01-07 LAB — BPAM RBC
Blood Product Expiration Date: 202112312359
Blood Product Expiration Date: 202112312359
ISSUE DATE / TIME: 202112060801
ISSUE DATE / TIME: 202112061324
Unit Type and Rh: 6200
Unit Type and Rh: 6200

## 2020-01-07 NOTE — Plan of Care (Signed)
  Problem: Activity: Goal: Ability to ambulate and perform ADLs will improve Outcome: Not Progressing Note: Patient did not make it to edge of bed with PT despite pain meds before PT arrived. Will re-evaluate 12/7

## 2020-01-07 NOTE — Progress Notes (Signed)
PROGRESS NOTE    [name]  UXN:[id number] DOB: August 18, 1940 DOA: 01/04/2020 PCP: [name], MD    Brief Narrative:  79 year old female with history of chronic heart failure, stage IIIb chronic kidney disease, Alzheimer's dementia, history of stroke, seizures and hypertension brought from nursing home with witnessed fall.  Apparently she was walking on the hallway and seen falling on her right side.  In the emergency room, hemodynamically stable.  Found to have right shoulder fracture and right intertrochanteric fracture of the femur.  She was recently admitted to the hospital and discharged on 12/2 to a skilled nursing facility after adjustment of her seizure medications.   Assessment & Plan:   Principal Problem:   Intertrochanteric fracture of right femur (HCC) Active Problems:   Seizure disorder (HCC)   H/O: CVA (cerebrovascular accident)   Protein-calorie malnutrition, severe- wgt 86 lbs   Chronic combined systolic and diastolic congestive heart failure (HCC)   Acute on chronic renal insufficiency   Alzheimer's dementia without behavioral disturbance (HCC)   Gout   Humeral head fracture   Humeral surgical neck fracture  Intercurrently fracture right femur: Status post IM nail 12/5 Weightbearing as tolerated Adequate opiates for pain relief Mobility with PT OT Postop management as per surgery DVT prophylaxis with subcu heparin.  Started today.  Right neck of humerus fracture: Nonweightbearing Sling Conservative management  Acute kidney injury on CKD stage IIIb: Baseline creatinine about 1.4.  Holding ARB.  Stabilizing.  Recheck renal function tomorrow morning.  Chest pain/abnormal EKG: Cardiology consulted.  No intervention needed.  Currently chest pain-free.  Seizure disorder: Recently admitted for seizure and encephalopathy.  Currently on increased dose of seizure medicine including Vimpat 200 mg twice a day, Keppra 1000 mg twice daily.  No recurrent  seizure.  Alzheimer's dementia: Continue home Aricept.  Delirium precautions.  Chronic combined heart failure: Euvolemic.  Hypertension: On Coreg and amlodipine.  Holding ARB in the setting of AKI.  Acute blood loss anemia: Suspect anticipated blood loss from surgery, anticipated blood loss from long bone fractures.  Hemoglobin 4.7 postop-2 units of PRBC -appropriate response and remains a stable.  Recheck tomorrow morning to ensure stabilization.  DVT prophylaxis: heparin injection 5,000 Units Start: 01/06/20 1800 SCDs Start: 01/05/20 2048 SCDs Start: 01/04/20 2035   Code Status: Full code  Family Communication: son called and updated on the phone. Disposition Plan: Status is: Inpatient  Remains inpatient appropriate because:Inpatient level of care appropriate due to severity of illness   Dispo: The patient is from: SNF              Anticipated d/c is to: SNF              Anticipated d/c date is: when bed available.              Patient currently is medically stable.     Consultants:   Orthopedics  Procedures:   IM nail 12/5 right femur  Antimicrobials:   None   Subjective: Patient seen and examined.  Poor historian.  Remains quiet.  Complains of pain on her right shoulder when asked.  No other overnight events.  Objective: Vitals:   01/06/20 1956 01/07/20 0019 01/07/20 0433 01/07/20 1337  BP: 103/70 98/66 126/76 136/84  Pulse: 80 83 79 89  Resp: 17 17 17 16   Temp: 98.3 F (36.8 C) 99 F (37.2 C) 98.4 F (36.9 C) 98.1 F (36.7 C)  TempSrc: Oral Oral    SpO2: 97% 97% 100% 100%  Intake/Output Summary (Last 24 hours) at 01/07/2020 1537 Last data filed at 01/06/2020 1737 Gross per 24 hour  Intake 450 ml  Output --  Net 450 ml   There were no vitals filed for this visit.  Examination:  General exam: Appears calm and comfortable  Pleasant and confused.  She still thinks he is in [place]. Respiratory system: Clear to auscultation. Respiratory effort  normal.  No added sounds. Cardiovascular system: S1 & S2 heard, RRR.  Gastrointestinal system: Abdomen is nondistended, soft and nontender. No organomegaly or masses felt. Normal bowel sounds heard. Central nervous system: Alert and awake.  Oriented x1. Right arm on sling.  Distal neurovascular status intact.  Pain on mobility. Right thigh incision clean and dry.  Distal neurovascular status intact.     Data Reviewed: I have personally reviewed following labs and imaging studies  CBC: Recent Labs  Lab 01/04/20 1754 01/05/20 0442 01/06/20 0310 01/06/20 2006 01/07/20 0519  WBC 13.5* 13.1* 12.5*  --  12.7*  NEUTROABS 11.6*  --  9.8*  --   --   HGB 11.6* 9.2* 4.7* 9.6* 9.3*  HCT 35.6* 28.6* 14.5* 28.2* 26.7*  MCV 100.6* 101.4* 98.6  --  86.7  PLT 306 218 173  --  175   Basic Metabolic Panel: Recent Labs  Lab 01/02/20 0453 01/04/20 1754 01/05/20 0442 01/06/20 0310 01/07/20 0229  NA 141 141 138 134* 136  K 4.2 4.7 4.9 4.7 4.4  CL 107 106 107 102 103  CO2 20* 22 19* 19* 22  GLUCOSE 94 134* 120* 113* 103*  BUN 19 31* 27* 40* 39*  CREATININE 1.38* 1.71* 1.49* 2.06* 1.93*  CALCIUM 9.6 11.0* 9.6 8.1* 8.5*   GFR: Estimated Creatinine Clearance: 14 mL/min (A) (by C-G formula based on SCr of 1.93 mg/dL (H)). Liver Function Tests: Recent Labs  Lab 01/04/20 1754  AST 28  ALT 27  ALKPHOS 57  BILITOT 0.7  PROT 8.3*  ALBUMIN 4.6   No results for input(s): LIPASE, AMYLASE in the last 168 hours. Recent Labs  Lab 01/04/20 1929  AMMONIA 15   Coagulation Profile: Recent Labs  Lab 01/04/20 1754  INR 1.0   Cardiac Enzymes: No results for input(s): CKTOTAL, CKMB, CKMBINDEX, TROPONINI in the last 168 hours. BNP (last 3 results) No results for input(s): PROBNP in the last 8760 hours. HbA1C: No results for input(s): HGBA1C in the last 72 hours. CBG: Recent Labs  Lab 01/02/20 0458 01/02/20 0811 01/02/20 1124 01/02/20 1601 01/04/20 1750  GLUCAP 97 103* 121* 99 122*    Lipid Profile: Recent Labs    01/07/20 0229  CHOL 135  HDL 41  LDLCALC 45  TRIG 247*  CHOLHDL 3.3   Thyroid Function Tests: No results for input(s): TSH, T4TOTAL, FREET4, T3FREE, THYROIDAB in the last 72 hours. Anemia Panel: No results for input(s): VITAMINB12, FOLATE, FERRITIN, TIBC, IRON, RETICCTPCT in the last 72 hours. Sepsis Labs: No results for input(s): PROCALCITON, LATICACIDVEN in the last 168 hours.  Recent Results (from the past 240 hour(s))  SARS Coronavirus 2 by RT PCR (hospital order, performed in [facility] hospital lab) Nasopharyngeal Nasopharyngeal Swab     Status: None   Collection Time: 01/02/20  8:41 AM   Specimen: Nasopharyngeal Swab  Result Value Ref Range Status   SARS Coronavirus 2 NEGATIVE NEGATIVE Final    Comment: (NOTE) SARS-CoV-2 target nucleic acids are NOT DETECTED.  The SARS-CoV-2 RNA is generally detectable in upper and lower respiratory specimens during the acute phase  of infection. The lowest concentration of SARS-CoV-2 viral copies this assay can detect is 250 copies / mL. A negative result does not preclude SARS-CoV-2 infection and should not be used as the sole basis for treatment or other patient management decisions.  A negative result may occur with improper specimen collection / handling, submission of specimen other than nasopharyngeal swab, presence of viral mutation(s) within the areas targeted by this assay, and inadequate number of viral copies (<250 copies / mL). A negative result must be combined with clinical observations, patient history, and epidemiological information.  Fact Sheet for Patients:   BoilerBrush.com.cy  Fact Sheet for Healthcare Providers: https://pope.com/  This test is not yet approved or  cleared by the [place] FDA and has been authorized for detection and/or diagnosis of SARS-CoV-2 by FDA under an Emergency Use Authorization (EUA).  This EUA will  remain in effect (meaning this test can be used) for the duration of the COVID-19 declaration under Section 564(b)(1) of the Act, 21 U.S.C. section 360bbb-3(b)(1), unless the authorization is terminated or revoked sooner.  Performed at [facility], [address] [place], [place] [address]   Resp Panel by RT-PCR (Flu A&B, Covid) Nasopharyngeal Swab     Status: None   Collection Time: 01/04/20  7:25 PM   Specimen: Nasopharyngeal Swab; Nasopharyngeal(NP) swabs in vial transport medium  Result Value Ref Range Status   SARS Coronavirus 2 by RT PCR NEGATIVE NEGATIVE Final    Comment: (NOTE) SARS-CoV-2 target nucleic acids are NOT DETECTED.  The SARS-CoV-2 RNA is generally detectable in upper respiratory specimens during the acute phase of infection. The lowest concentration of SARS-CoV-2 viral copies this assay can detect is 138 copies/mL. A negative result does not preclude SARS-Cov-2 infection and should not be used as the sole basis for treatment or other patient management decisions. A negative result may occur with  improper specimen collection/handling, submission of specimen other than nasopharyngeal swab, presence of viral mutation(s) within the areas targeted by this assay, and inadequate number of viral copies(<138 copies/mL). A negative result must be combined with clinical observations, patient history, and epidemiological information. The expected result is Negative.  Fact Sheet for Patients:  BloggerCourse.com  Fact Sheet for Healthcare Providers:  SeriousBroker.it  This test is no t yet approved or cleared by the [place] FDA and  has been authorized for detection and/or diagnosis of SARS-CoV-2 by FDA under an Emergency Use Authorization (EUA). This EUA will remain  in effect (meaning this test can be used) for the duration of the COVID-19 declaration under Section 564(b)(1) of the Act, 21 U.S.C.section  360bbb-3(b)(1), unless the authorization is terminated  or revoked sooner.       Influenza A by PCR NEGATIVE NEGATIVE Final   Influenza B by PCR NEGATIVE NEGATIVE Final    Comment: (NOTE) The Xpert Xpress SARS-CoV-2/FLU/RSV plus assay is intended as an aid in the diagnosis of influenza from Nasopharyngeal swab specimens and should not be used as a sole basis for treatment. Nasal washings and aspirates are unacceptable for Xpert Xpress SARS-CoV-2/FLU/RSV testing.  Fact Sheet for Patients: BloggerCourse.com  Fact Sheet for Healthcare Providers: SeriousBroker.it  This test is not yet approved or cleared by the [place] FDA and has been authorized for detection and/or diagnosis of SARS-CoV-2 by FDA under an Emergency Use Authorization (EUA). This EUA will remain in effect (meaning this test can be used) for the duration of the COVID-19 declaration under Section 564(b)(1) of the Act, 21 U.S.C.  section 360bbb-3(b)(1), unless the authorization is terminated or revoked.  Performed at [facility], [address] [place], [place] [address]   Surgical pcr screen     Status: None   Collection Time: 01/05/20 11:02 AM   Specimen: Nasal Mucosa; Nasal Swab  Result Value Ref Range Status   MRSA, PCR NEGATIVE NEGATIVE Final   Staphylococcus aureus NEGATIVE NEGATIVE Final    Comment: (NOTE) The Xpert SA Assay (FDA approved for NASAL specimens in patients 15 years of age and older), is one component of a comprehensive surveillance program. It is not intended to diagnose infection nor to guide or monitor treatment. Performed at [facility], [address], [place], [place] [address]          Radiology Studies: No results found.      Scheduled Meds: . (feeding supplement) PROSource Plus  30 mL Oral BID BM  . allopurinol  50 mg Oral Daily  . amLODipine  10 mg Oral Daily  . calcium carbonate  1,250 mg Oral QPM   . carvedilol  12.5 mg Oral BID WC  . docusate sodium  100 mg Oral BID  . donepezil  5 mg Oral QHS  . ferrous sulfate  325 mg Oral q morning - 10a  . heparin injection (subcutaneous)  5,000 Units Subcutaneous Q12H  . lacosamide  200 mg Oral BID  . levETIRAcetam  1,000 mg Oral BID  . multivitamin with minerals  1 tablet Oral Daily   Continuous Infusions:   LOS: 3 days    Time spent: 30 minutes    Dorcas Carrow, MD [facility] Pager [phone]

## 2020-01-07 NOTE — Progress Notes (Signed)
Patient found to have peeled off her hydrocolloid dressing on the distal aspect of her right leg. Dressing replaced with mepilex times 2. Site is clean dry and intact with steri strips still in place.

## 2020-01-07 NOTE — Progress Notes (Signed)
   ORTHOPAEDIC PROGRESS NOTE  s/p Procedure(s): INTRAMEDULLARY (IM) NAIL INTERTROCHANTRIC on 01/05/2020 with Dr. Everardo Pacific  SUBJECTIVE: States she feels better today. Pain in hip and shoulder have improved some.   OBJECTIVE: PE: General: laying in hospital bed, NAD Right upper extremity: mildly tender to palpation right shoulder. ROM not tested in setting of known fracture. Wiggles her fingers. Endorses distal sensation. Warm well perfused hand. Sling in place. Right lower extremity: dressings CDI. Tender to palpation right hip and knee. Wiggles her toes. Endorses distal sensation. Warm well perfused foot.   Vitals:   01/07/20 0019 01/07/20 0433  BP: 98/66 126/76  Pulse: 83 79  Resp: 17 17  Temp: 99 F (37.2 C) 98.4 F (36.9 C)  SpO2: 97% 100%     ASSESSMENT: [name] is a 79 y.o. female   1. Right proximal humerus fracture - non operative management  2. Right femur fracture - POD #2  PLAN: Weightbearing:   - RUE: NWB in sling  - RLE: WBAT  Insicional and dressing care:   - RLE: Reinforce dressings as needed Orthopedic device(s):   - RUE: sling Showering: Post-op day #2 with assistance VTE prophylaxis:  Will start on Heparin 5000 units every 12 hours when Hgb stable as recommended by pharmacy. Hgb 9.3 this morning.  Pain control: PRN pain medications, preferring oral medications. Minimize narcotics as able.  Follow - up plan: 2 weeks in office with Dr. Salomon Fick information: Dr. Ramond Marrow, Alfonse Alpers PA-C, After hours and holidays please check [email or web address] for group call information for [facility] Dispo: TBD. Will likely need SNF. PT eval pending. OT recommending SNF. TOC consult placed. Patient came from [place], but family is concerned about the care she is receiving there.    Alfonse Alpers, PA-C 01/07/2020

## 2020-01-07 NOTE — Consult Note (Signed)
   [facility] CM Inpatient Consult   01/07/2020  [name] Jun 20, 1940 [id number]   [facility] [[facility]]  Accountable Care Organization [ACO] Patient: [facility] Medicare   Patient screened for high risk score for unplanned readmission score and for less than 7 days readmission hospitalizations.  Medical record was reviewed to check if potential [facility]  [[facility]] Care Management service needs.  Review of patient's medical record reveals patient is again being recommended by PT/OT for a skilled nursing facility level of care.  Primary Care Provider is [name], MD this provider is listed to provide the transition of care [TOC] for post hospital follow up.   Plan:  Continue to follow progress and disposition to assess for post hospital care management needs.  Spoke with inpatient TOC RNCM, will continue to follow for disposition.  Please place a [facility] Care Management consult as appropriate and for questions contact:   Charlesetta Shanks, RN BSN CCM [facility]  [phone] business mobile phone Toll free office [phone]  Fax number: [phone] [email or web address]

## 2020-01-07 NOTE — Evaluation (Signed)
Physical Therapy Evaluation Patient Details Name: [name] MRN: [id number] DOB: 1940-04-03 Today's Date: 01/07/2020   History of Present Illness  [name] is a 79 y.o. female with PMHx: heart failure, CKD 3, Alzheimer's dementia, CVA, seizure, gout, HTN, malnutrition who presents from her nursing facility following a witnessed mechanical fall.  Head CT on 12/4 was remarkable for "Stable chronic small vessel ischemic changes. No acute intracranial process. Pt with R femur fx resulting in IM nail 12/5 to R hip and R proximal humerus fx- non operative.  Clinical Impression  Patient presents with decreased mobility due to weakness, decreased activity tolerance, pain, decreased balance and poor safety awareness remains at high fall risk.  She will benefit from skilled PT in the acute setting and from follow up SNF level rehab at d/c.  Today needing +2 A for sit to stand and mod to max A for sitting balance at EOB.      Follow Up Recommendations Supervision/Assistance - 24 hour;SNF    Equipment Recommendations  None recommended by PT    Recommendations for Other Services       Precautions / Restrictions Precautions Precautions: Fall Precaution Comments: seizure precautions Required Braces or Orthoses: Sling Restrictions Weight Bearing Restrictions: Yes RUE Weight Bearing: Non weight bearing RLE Weight Bearing: Weight bearing as tolerated      Mobility  Bed Mobility Overal bed mobility: Needs Assistance Bed Mobility: Supine to Sit;Sit to Supine     Supine to sit: HOB elevated;Max assist;+2 for safety/equipment Sit to supine: Max assist;+2 for physical assistance   General bed mobility comments: initiating moving legs slowly to EOB and pulls up with HHA on L UE; to supine +2 A for legs and trunk    Transfers Overall transfer level: Needs assistance Equipment used: 2 person hand held assist Transfers: Sit to/from Stand Sit to Stand: Max assist;+2  physical assistance         General transfer comment: stood with A for postural support due to forward lean and due to pain R LE; stood second time for changing dirty pad under her  Ambulation/Gait             General Gait Details: unable  Stairs            Wheelchair Mobility    Modified Rankin (Stroke Patients Only)       Balance Overall balance assessment: Needs assistance Sitting-balance support: Feet supported Sitting balance-Leahy Scale: Poor Sitting balance - Comments: max A for sitting due to leaning L and forward; once corrected slightly less support, but pt kept head forward despite cues to look up   Standing balance support: Bilateral upper extremity supported Standing balance-Leahy Scale: Zero Standing balance comment: +2  A for safety in standing                             Pertinent Vitals/Pain Pain Assessment: 0-10 Pain Score: 7  Pain Location: R hip Pain Descriptors / Indicators: Grimacing;Guarding Pain Intervention(s): Monitored during session;Limited activity within patient's tolerance;Repositioned    Home Living Family/patient expects to be discharged to:: Skilled nursing facility               Home Equipment: Shower seat;Cane - single point;Bedside commode;Walker - 2 wheels      Prior Function Level of Independence: Needs assistance   Gait / Transfers Assistance Needed: was independent at home prior to recent admission due to seizure, was d/c to  [facility] for SNF rehab 01/02/2020           Hand Dominance        Extremity/Trunk Assessment   Upper Extremity Assessment Upper Extremity Assessment: Defer to OT evaluation    Lower Extremity Assessment Lower Extremity Assessment: RLE deficits/detail;LLE deficits/detail RLE Deficits / Details: ankle AROM WFL, hip and knee flexion limited by pain, but eventual AAROM WFL, strength NT due to pain and pt not lifting antigravity despite cues LLE Deficits / Details:  AROM WFL, strength 4-/5 throughout    Cervical / Trunk Assessment Cervical / Trunk Assessment: Kyphotic  Communication   Communication: Expressive difficulties (dysarthric)  Cognition Arousal/Alertness: Awake/alert Behavior During Therapy: Flat affect Overall Cognitive Status: No family/caregiver present to determine baseline cognitive functioning Area of Impairment: Attention;Following commands;Orientation;Problem solving                 Orientation Level: Disoriented to;Time Current Attention Level: Sustained   Following Commands: Follows one step commands with increased time;Follows one step commands consistently;Follows multi-step commands inconsistently Safety/Judgement: Decreased awareness of safety   Problem Solving: Slow processing        General Comments General comments (skin integrity, edema, etc.): did report some lightheadedness in standing    Exercises Total Joint Exercises Ankle Circles/Pumps: AROM;AAROM;10 reps;Supine   Assessment/Plan    PT Assessment Patient needs continued PT services  PT Problem List Decreased strength;Decreased mobility;Decreased coordination;Decreased activity tolerance;Decreased balance;Decreased knowledge of use of DME;Decreased safety awareness       PT Treatment Interventions Therapeutic activities;DME instruction;Cognitive remediation;Gait training;Therapeutic exercise;Patient/family education;Functional mobility training;Balance training;Wheelchair mobility training;Neuromuscular re-education    PT Goals (Current goals can be found in the Care Plan section)  Acute Rehab PT Goals Patient Stated Goal: agreeable to rehab PT Goal Formulation: Patient unable to participate in goal setting Time For Goal Achievement: 01/21/20 Potential to Achieve Goals: Fair    Frequency Min 2X/week   Barriers to discharge        Co-evaluation               AM-PAC PT "6 Clicks" Mobility  Outcome Measure Help needed turning from  your back to your side while in a flat bed without using bedrails?: A Lot Help needed moving from lying on your back to sitting on the side of a flat bed without using bedrails?: A Lot Help needed moving to and from a bed to a chair (including a wheelchair)?: A Lot Help needed standing up from a chair using your arms (e.g., wheelchair or bedside chair)?: A Lot Help needed to walk in hospital room?: Total Help needed climbing 3-5 steps with a railing? : Total 6 Click Score: 10    End of Session   Activity Tolerance: Patient tolerated treatment well Patient left: in bed;with call bell/phone within reach;with bed alarm set   PT Visit Diagnosis: Other symptoms and signs involving the nervous system (R29.898);History of falling (Z91.81);Other abnormalities of gait and mobility (R26.89)    Time: 0258-5277 PT Time Calculation (min) (ACUTE ONLY): 27 min   Charges:   PT Evaluation $PT Eval Moderate Complexity: 1 Mod PT Treatments $Therapeutic Activity: 8-22 mins        Sheran Lawless, PT Acute Rehabilitation Services [phone] Office:[phone] 01/07/2020   Elray Mcgregor 01/07/2020, 1:21 PM

## 2020-01-07 NOTE — Care Management Important Message (Signed)
Important Message  Patient Details  Name: [name] MRN: [id number] Date of Birth: December 31, 1940   Medicare Important Message Given:  Yes     Dorena Bodo 01/07/2020, 3:41 PM

## 2020-01-07 NOTE — NC FL2 (Signed)
[place] MEDICAID FL2 LEVEL OF CARE SCREENING TOOL     IDENTIFICATION  Patient Name: [name] Birthdate: [birth year] Sex: female Admission Date (Current Location): 01/04/2020  [facility] and [place] Number:  Producer, television/film/video and Address:  [facility], [address] [place], [place] [address]      Provider Number: [id number]  Attending Physician Name and Address:  Dorcas Carrow, MD  Relative Name and Phone Number:  [name], son, [phone]    Current Level of Care:   Recommended Level of Care: Skilled Nursing Facility Prior Approval Number:    Date Approved/Denied:   PASRR Number: [id number]  Discharge Plan:      Current Diagnoses: Patient Active Problem List   Diagnosis Date Noted  . Intertrochanteric fracture of right femur (HCC) 01/04/2020  . Humeral head fracture 01/04/2020  . Humeral surgical neck fracture 01/04/2020  . Witnessed seizure-like activity (HCC) 12/27/2019  . Chronic kidney disease, stage 3b (HCC) 12/27/2019  . Alzheimer's dementia without behavioral disturbance (HCC) 12/27/2019  . Gout 12/27/2019  . Leukocytosis 12/27/2019  . Cough   . Encounter for palliative care   . Goals of care, counseling/discussion   . Pneumonia   . NICM (nonischemic cardiomyopathy) -EF 25% 05/20/2015  . No significant CAD at cath May 2012 05/20/2015  . Anemia 05/20/2015  . Diastolic dysfunction-grade 3 05/20/2015  . Acute renal failure superimposed on stage 3 chronic kidney disease (HCC)   . CAP (community acquired pneumonia)   . Hyperammonemia (HCC) 05/17/2015  . Metabolic encephalopathy 05/16/2015  . Acute on chronic renal insufficiency 05/15/2015  . Chest pain 05/14/2015  . Troponin level elevated 05/14/2015  . Chronic combined systolic and diastolic congestive heart failure (HCC) 03/06/2014  . Acute combined systolic and diastolic congestive heart failure (HCC) 02/24/2013  . Weight gain 02/24/2013  . Volume overload  02/24/2013  . Hyperglycemia 02/24/2013  . Dyspnea 02/24/2013  . Constipation 02/24/2013  . Abdominal pain 02/24/2013  . Pleuritic chest pain 02/24/2013  . Epilepsy (HCC)   . Smoker   . 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 Protein-calorie malnutrition, severe- wgt 86 lbs 02/13/2013  . Pneumobilia 02/12/2013  . Influenza A 02/11/2013  . Hyperkalemia 02/11/2013  . Hyponatremia 02/11/2013  . HCAP (healthcare-associated pneumonia) 02/11/2013  . Liver enzyme elevation 02/11/2013  . Basal ganglia infarction (HCC) 01/08/2013  . H/O: CVA (cerebrovascular accident) 01/04/2013  . Generalized seizure (HCC) 12/28/2012  . 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 Seizure disorder (HCC) 12/28/2012  . Metabolic acidosis 12/28/2012  . Encephalopathy 12/28/2012  . Acute renal failure (HCC) 12/28/2012    Orientation RESPIRATION BLADDER Height & Weight        Normal Incontinent, External catheter Weight:   Height:     BEHAVIORAL SYMPTOMS/MOOD NEUROLOGICAL BOWEL NUTRITION STATUS      Incontinent Diet  AMBULATORY STATUS COMMUNICATION OF NEEDS Skin   Extensive Assist Verbally                         Personal Care Assistance Level of Assistance  Dressing, Feeding, Bathing Bathing Assistance: Maximum assistance Feeding assistance: Limited assistance Dressing Assistance: Limited assistance     Functional Limitations Info  Hearing, Speech, Sight Sight Info: Adequate Hearing Info: Adequate Speech Info: Adequate    SPECIAL CARE FACTORS FREQUENCY  PT (By licensed PT), OT (By licensed OT)     PT Frequency: 5x a week OT Frequency: 5x a week  Contractures Contractures Info: Not present    Additional Factors Info  Allergies Code Status Info: Full Allergies Info: NKA           Current Medications (01/07/2020):  This is the current hospital active medication list Current Facility-Administered Medications  Medication Dose Route  Frequency Provider Last Rate Last Admin  . (feeding supplement) PROSource Plus liquid 30 mL  30 mL Oral BID BM Dorcas Carrow, MD   30 mL at 01/07/20 1455  . allopurinol (ZYLOPRIM) tablet 50 mg  50 mg Oral Daily Vernetta Honey, PA-C   50 mg at 01/07/20 1054  . amLODipine (NORVASC) tablet 10 mg  10 mg Oral Daily Vernetta Honey, PA-C   10 mg at 01/07/20 1054  . calcium carbonate (OS-CAL - dosed in mg of elemental calcium) tablet 1,250 mg  1,250 mg Oral QPM McBane, Caroline N, PA-C   1,250 mg at 01/06/20 1616  . carvedilol (COREG) tablet 12.5 mg  12.5 mg Oral BID WC McBane, Caroline N, PA-C   12.5 mg at 01/07/20 0847  . docusate sodium (COLACE) capsule 100 mg  100 mg Oral BID Vernetta Honey, PA-C   100 mg at 01/07/20 1054  . donepezil (ARICEPT) tablet 5 mg  5 mg Oral QHS Vernetta Honey, PA-C   5 mg at 01/06/20 2219  . ferrous sulfate tablet 325 mg  325 mg Oral q morning - 10a Vernetta Honey, PA-C   325 mg at 01/07/20 1054  . heparin injection 5,000 Units  5,000 Units Subcutaneous Q12H Vernetta Honey, PA-C   5,000 Units at 01/07/20 1054  . HYDROcodone-acetaminophen (NORCO/VICODIN) 5-325 MG per tablet 1-2 tablet  1-2 tablet Oral Q4H PRN Vernetta Honey, PA-C   1 tablet at 01/07/20 1455  . lacosamide (VIMPAT) tablet 200 mg  200 mg Oral BID Vernetta Honey, PA-C   200 mg at 01/07/20 1054  . levETIRAcetam (KEPPRA) tablet 1,000 mg  1,000 mg Oral BID Vernetta Honey, PA-C   1,000 mg at 01/07/20 1054  . menthol-cetylpyridinium (CEPACOL) lozenge 3 mg  1 lozenge Oral PRN McBane, Jerald Kief, PA-C       Or  . phenol (CHLORASEPTIC) mouth spray 1 spray  1 spray Mouth/Throat PRN McBane, Jerald Kief, PA-C      . metoCLOPramide (REGLAN) tablet 5-10 mg  5-10 mg Oral Q8H PRN McBane, Jerald Kief, PA-C       Or  . metoCLOPramide (REGLAN) injection 5-10 mg  5-10 mg Intravenous Q8H PRN McBane, Caroline N, PA-C      . morphine 2 MG/ML injection 0.25-0.5 mg  0.25-0.5 mg Intravenous Q2H PRN  McBane, Jerald Kief, PA-C      . multivitamin with minerals tablet 1 tablet  1 tablet Oral Daily Dorcas Carrow, MD   1 tablet at 01/07/20 1054  . ondansetron (ZOFRAN) tablet 4 mg  4 mg Oral Q6H PRN McBane, Jerald Kief, PA-C       Or  . ondansetron (ZOFRAN) injection 4 mg  4 mg Intravenous Q6H PRN McBane, Jerald Kief, PA-C      . polyethylene glycol (MIRALAX / GLYCOLAX) packet 17 g  17 g Oral Daily PRN Vernetta Honey, PA-C         Discharge Medications: Please see discharge summary for a list of discharge medications.  Relevant Imaging Results:  Relevant Lab Results:   Additional Information SSN: [id number]. Vaccinated.  [name], [place]

## 2020-01-08 LAB — CBC
HCT: 26.7 % — ABNORMAL LOW (ref 36.0–46.0)
Hemoglobin: 9 g/dL — ABNORMAL LOW (ref 12.0–15.0)
MCH: 29.7 pg (ref 26.0–34.0)
MCHC: 33.7 g/dL (ref 30.0–36.0)
MCV: 88.1 fL (ref 80.0–100.0)
Platelets: 186 10*3/uL (ref 150–400)
RBC: 3.03 MIL/uL — ABNORMAL LOW (ref 3.87–5.11)
RDW: 18.9 % — ABNORMAL HIGH (ref 11.5–15.5)
WBC: 11.5 10*3/uL — ABNORMAL HIGH (ref 4.0–10.5)
nRBC: 0.3 % — ABNORMAL HIGH (ref 0.0–0.2)

## 2020-01-08 MED ORDER — HYDROCODONE-ACETAMINOPHEN 5-325 MG PO TABS: 1.0000 | ORAL_TABLET | Freq: Four times a day (QID) | ORAL | 0 refills | Status: DC | PRN

## 2020-01-08 NOTE — TOC CAGE-AID Note (Signed)
Transition of Care [facility]) - CAGE-AID Screening   Patient Details  Name: [name] MRN: [id number] Date of Birth: 09/05/1940  Transition of Care [facility]) CM/SW Contact:    [name], [place] Phone Number: 01/08/2020, 2:15 PM   Clinical Narrative:  Pt is unable to participate in assessment due to not being fully oriented.  CAGE-AID Screening: Substance Abuse Screening unable to be completed due to: : Patient unable to participate            Isabella Stalling Clinical Social Worker [phone]

## 2020-01-08 NOTE — Progress Notes (Signed)
PROGRESS NOTE    [name]  PZW:[id number] DOB: 1940/08/03 DOA: 01/04/2020 PCP: [name], MD   Brief Narrative:  79 year old female with history of chronic heart failure, stage IIIb chronic kidney disease, Alzheimer's dementia, history of stroke, seizures and hypertension brought from nursing home with witnessed fall.  Apparently she was walking on the hallway and seen falling on her right side.  In the emergency room, hemodynamically stable.  Found to have right shoulder fracture and right intertrochanteric fracture of the femur.  She was recently admitted to the hospital and discharged on 12/2 to a skilled nursing facility after adjustment of her seizure medications.  Assessment & Plan:  Right intertrochanteric fracture of femur: -Status post intramedullary nailing on 12/5 by orthopedic surgery -Continue as needed pain medication -Continue PT/OT recommended SNF -DVT prophylaxis with subcu heparin -Orthopedic recommended weightbearing as tolerated.  Follow-up in 2 weeks with Dr. Everardo Pacific. -Monitor H&H and vitals closely  Right proximal humerus fracture: -Orthopedic recommended nonoperative management. -Recommended nonweightbearing in a sling -Continue as needed pain medications  AKI on CKD stage IIIb: -Improving.  Continue to hold nephrotoxic medication.  Monitor kidney function closely  Chest pain/abnormal EKG: Cardiology consulted recommended no intervention at this time.  Patient denies ACS symptoms.  Continue to monitor  Seizure disorder: Recently admitted for seizure and encephalopathy.  Currently on increased dose of Vimpat 200 mg twice daily, Keppra thousand twice daily.  No recurrent seizure reported during hospitalization.  Alzheimer's dementia: Continue Aricept.  Delirium precautions.  Chronic combined systolic and diastolic CHF: Patient appears euvolemic on exam.  Continue to monitor.  Monitor signs of fluid overload. -Reviewed echo from 12/28/2019 which  showed ejection fraction of 40 to 45%. -Not on diuretics at home.  Continue to monitor.  Hypertension: Stable.  Continue Coreg and amlodipine.  Continue to hold ARB in the setting of worsening kidney function.  Acute blood loss anemia: In the setting of recent surgery.  Patient H&H dropped 4.7.  Status post 2 unit PRBC transfusion.  Monitor H&H closely and transfuse as needed. -Continue ferrous sulfate.  History of gout: Continue allopurinol  History of CVA: Continue aspirin  DVT prophylaxis: Heparin Code Status: Full code Family Communication:  None present at bedside.  Plan of care discussed with patient in length and she verbalized understanding and agreed with it. Disposition Plan: Likely SNF tomorrow Consultants:  Orthopedic surgery Procedures:   Intramedullary nailing  Antimicrobials:   None  Status is: Inpatient  Dispo: The patient is from: SNF              Anticipated d/c is to: SNF              Anticipated d/c date is: 1 day              Patient currently is not medically stable to d/c.   Subjective: Patient seen and examined.  Resting comfortably on the bed.  Reports cramps in her right leg however denies severe pain.  Denies chest pain, shortness of breath, nausea, vomiting, fever or chills.  Objective: Vitals:   01/07/20 1955 01/07/20 2015 01/08/20 0355 01/08/20 1502  BP: (!) 87/56 102/62 127/76 118/83  Pulse: 78 76 79 81  Resp: 16  17 14   Temp: 98.9 F (37.2 C)  98.6 F (37 C) 98.8 F (37.1 C)  TempSrc: Oral  Oral   SpO2: 97%  98% 99%    Intake/Output Summary (Last 24 hours) at 01/08/2020 1505 Last data filed at 01/08/2020 1500  Gross per 24 hour  Intake 340 ml  Output 1000 ml  Net -660 ml   There were no vitals filed for this visit.  Examination:  General exam: Appears calm and comfortable, elderly looking, on room air, communicating well, thin and lean  Respiratory system: Clear to auscultation. Respiratory effort normal. Cardiovascular  system: S1 & S2 heard, RRR. No JVD, murmurs, rubs, gallops or clicks. No pedal edema. Gastrointestinal system: Abdomen is nondistended, soft and nontender. No organomegaly or masses felt. Normal bowel sounds heard. Central nervous system: Alert and oriented. No focal neurological deficits. Extremities: Symmetric 5 x 5 power. Skin: No rashes, lesions or ulcers.  Dressing dry intact noted on right upper and lateral side of hip Psychiatry: Judgement and insight appear normal. Mood & affect appropriate.    Data Reviewed: I have personally reviewed following labs and imaging studies  CBC: Recent Labs  Lab 01/04/20 1754 01/04/20 1754 01/05/20 0442 01/06/20 0310 01/06/20 2006 01/07/20 0519 01/08/20 0151  WBC 13.5*  --  13.1* 12.5*  --  12.7* 11.5*  NEUTROABS 11.6*  --   --  9.8*  --   --   --   HGB 11.6*   < > 9.2* 4.7* 9.6* 9.3* 9.0*  HCT 35.6*   < > 28.6* 14.5* 28.2* 26.7* 26.7*  MCV 100.6*  --  101.4* 98.6  --  86.7 88.1  PLT 306  --  218 173  --  175 186   < > = values in this interval not displayed.   Basic Metabolic Panel: Recent Labs  Lab 01/02/20 0453 01/04/20 1754 01/05/20 0442 01/06/20 0310 01/07/20 0229  NA 141 141 138 134* 136  K 4.2 4.7 4.9 4.7 4.4  CL 107 106 107 102 103  CO2 20* 22 19* 19* 22  GLUCOSE 94 134* 120* 113* 103*  BUN 19 31* 27* 40* 39*  CREATININE 1.38* 1.71* 1.49* 2.06* 1.93*  CALCIUM 9.6 11.0* 9.6 8.1* 8.5*   GFR: Estimated Creatinine Clearance: 14 mL/min (A) (by C-G formula based on SCr of 1.93 mg/dL (H)). Liver Function Tests: Recent Labs  Lab 01/04/20 1754  AST 28  ALT 27  ALKPHOS 57  BILITOT 0.7  PROT 8.3*  ALBUMIN 4.6   No results for input(s): LIPASE, AMYLASE in the last 168 hours. Recent Labs  Lab 01/04/20 1929  AMMONIA 15   Coagulation Profile: Recent Labs  Lab 01/04/20 1754  INR 1.0   Cardiac Enzymes: No results for input(s): CKTOTAL, CKMB, CKMBINDEX, TROPONINI in the last 168 hours. BNP (last 3 results) No  results for input(s): PROBNP in the last 8760 hours. HbA1C: No results for input(s): HGBA1C in the last 72 hours. CBG: Recent Labs  Lab 01/02/20 0458 01/02/20 0811 01/02/20 1124 01/02/20 1601 01/04/20 1750  GLUCAP 97 103* 121* 99 122*   Lipid Profile: Recent Labs    01/07/20 0229  CHOL 135  HDL 41  LDLCALC 45  TRIG 247*  CHOLHDL 3.3   Thyroid Function Tests: No results for input(s): TSH, T4TOTAL, FREET4, T3FREE, THYROIDAB in the last 72 hours. Anemia Panel: No results for input(s): VITAMINB12, FOLATE, FERRITIN, TIBC, IRON, RETICCTPCT in the last 72 hours. Sepsis Labs: No results for input(s): PROCALCITON, LATICACIDVEN in the last 168 hours.  Recent Results (from the past 240 hour(s))  SARS Coronavirus 2 by RT PCR (hospital order, performed in [facility] hospital lab) Nasopharyngeal Nasopharyngeal Swab     Status: None   Collection Time: 01/02/20  8:41 AM   Specimen:  Nasopharyngeal Swab  Result Value Ref Range Status   SARS Coronavirus 2 NEGATIVE NEGATIVE Final    Comment: (NOTE) SARS-CoV-2 target nucleic acids are NOT DETECTED.  The SARS-CoV-2 RNA is generally detectable in upper and lower respiratory specimens during the acute phase of infection. The lowest concentration of SARS-CoV-2 viral copies this assay can detect is 250 copies / mL. A negative result does not preclude SARS-CoV-2 infection and should not be used as the sole basis for treatment or other patient management decisions.  A negative result may occur with improper specimen collection / handling, submission of specimen other than nasopharyngeal swab, presence of viral mutation(s) within the areas targeted by this assay, and inadequate number of viral copies (<250 copies / mL). A negative result must be combined with clinical observations, patient history, and epidemiological information.  Fact Sheet for Patients:   BoilerBrush.com.cy  Fact Sheet for Healthcare  Providers: https://pope.com/  This test is not yet approved or  cleared by the [place] FDA and has been authorized for detection and/or diagnosis of SARS-CoV-2 by FDA under an Emergency Use Authorization (EUA).  This EUA will remain in effect (meaning this test can be used) for the duration of the COVID-19 declaration under Section 564(b)(1) of the Act, 21 U.S.C. section 360bbb-3(b)(1), unless the authorization is terminated or revoked sooner.  Performed at [facility], [address], [place], [place] [address]   Resp Panel by RT-PCR (Flu A&B, Covid) Nasopharyngeal Swab     Status: None   Collection Time: 01/04/20  7:25 PM   Specimen: Nasopharyngeal Swab; Nasopharyngeal(NP) swabs in vial transport medium  Result Value Ref Range Status   SARS Coronavirus 2 by RT PCR NEGATIVE NEGATIVE Final    Comment: (NOTE) SARS-CoV-2 target nucleic acids are NOT DETECTED.  The SARS-CoV-2 RNA is generally detectable in upper respiratory specimens during the acute phase of infection. The lowest concentration of SARS-CoV-2 viral copies this assay can detect is 138 copies/mL. A negative result does not preclude SARS-Cov-2 infection and should not be used as the sole basis for treatment or other patient management decisions. A negative result may occur with  improper specimen collection/handling, submission of specimen other than nasopharyngeal swab, presence of viral mutation(s) within the areas targeted by this assay, and inadequate number of viral copies(<138 copies/mL). A negative result must be combined with clinical observations, patient history, and epidemiological information. The expected result is Negative.  Fact Sheet for Patients:  BloggerCourse.com  Fact Sheet for Healthcare Providers:  SeriousBroker.it  This test is no t yet approved or cleared by the [place] FDA and  has been authorized  for detection and/or diagnosis of SARS-CoV-2 by FDA under an Emergency Use Authorization (EUA). This EUA will remain  in effect (meaning this test can be used) for the duration of the COVID-19 declaration under Section 564(b)(1) of the Act, 21 U.S.C.section 360bbb-3(b)(1), unless the authorization is terminated  or revoked sooner.       Influenza A by PCR NEGATIVE NEGATIVE Final   Influenza B by PCR NEGATIVE NEGATIVE Final    Comment: (NOTE) The Xpert Xpress SARS-CoV-2/FLU/RSV plus assay is intended as an aid in the diagnosis of influenza from Nasopharyngeal swab specimens and should not be used as a sole basis for treatment. Nasal washings and aspirates are unacceptable for Xpert Xpress SARS-CoV-2/FLU/RSV testing.  Fact Sheet for Patients: BloggerCourse.com  Fact Sheet for Healthcare Providers: SeriousBroker.it  This test is not yet approved or cleared by the [place] FDA  and has been authorized for detection and/or diagnosis of SARS-CoV-2 by FDA under an Emergency Use Authorization (EUA). This EUA will remain in effect (meaning this test can be used) for the duration of the COVID-19 declaration under Section 564(b)(1) of the Act, 21 U.S.C. section 360bbb-3(b)(1), unless the authorization is terminated or revoked.  Performed at [facility], [address] [place], [place] [address]   Surgical pcr screen     Status: None   Collection Time: 01/05/20 11:02 AM   Specimen: Nasal Mucosa; Nasal Swab  Result Value Ref Range Status   MRSA, PCR NEGATIVE NEGATIVE Final   Staphylococcus aureus NEGATIVE NEGATIVE Final    Comment: (NOTE) The Xpert SA Assay (FDA approved for NASAL specimens in patients 67 years of age and older), is one component of a comprehensive surveillance program. It is not intended to diagnose infection nor to guide or monitor treatment. Performed at [facility], [address]  [place], [place] [address]       Radiology Studies: No results found.  Scheduled Meds: . (feeding supplement) PROSource Plus  30 mL Oral BID BM  . allopurinol  50 mg Oral Daily  . amLODipine  10 mg Oral Daily  . calcium carbonate  1,250 mg Oral QPM  . carvedilol  12.5 mg Oral BID WC  . docusate sodium  100 mg Oral BID  . donepezil  5 mg Oral QHS  . ferrous sulfate  325 mg Oral q morning - 10a  . heparin injection (subcutaneous)  5,000 Units Subcutaneous Q12H  . lacosamide  200 mg Oral BID  . levETIRAcetam  1,000 mg Oral BID  . multivitamin with minerals  1 tablet Oral Daily   Continuous Infusions:   LOS: 4 days   Time spent: 40 minutes  Farah Lepak Estill Cotta, MD [facility]  If 7PM-7AM, please contact night-coverage [email or web address] 01/08/2020, 3:05 PM

## 2020-01-08 NOTE — Progress Notes (Signed)
   ORTHOPAEDIC PROGRESS NOTE  s/p Procedure(s): INTRAMEDULLARY (IM) NAIL INTERTROCHANTRIC on 01/05/2020 with Dr. Everardo Pacific  SUBJECTIVE:  States she feels better each day. She peeled her Aquacel dressing off of the distal incision - new mepilex was placed. No other overnight events.   OBJECTIVE: PE: General: laying in hospital bed, NAD Right upper extremity: mildly tender to palpation right shoulder. ROM not tested in setting of known fracture. Wiggles her fingers. Warm well perfused hand. Sling in place. Right lower extremity: dressings CDI. Tender to palpation right hip and knee. Wiggles her toes. Warm well perfused foot. Offloader boot in place.    Vitals:   01/07/20 2015 01/08/20 0355  BP: 102/62 127/76  Pulse: 76 79  Resp:  17  Temp:  98.6 F (37 C)  SpO2:  98%     ASSESSMENT: [name] is a 79 y.o. female   1. Right proximal humerus fracture - non operative management  2. Right femur fracture - POD #3  PLAN: Weightbearing:   - RUE: NWB in sling  - RLE: WBAT  Insicional and dressing care:   - RLE: Reinforce dressings as needed Orthopedic device(s):   - RUE: sling Showering: Post-op day #2 with assistance VTE prophylaxis:  Will start on Heparin 5000 units every 12 hours when Hgb stable as recommended by pharmacy. Hgb 9.0 this morning.  Pain control: PRN pain medications, preferring oral medications. Minimize narcotics as able.  Follow - up plan: 2 weeks in office with Dr. Salomon Fick information: Dr. Ramond Marrow, Alfonse Alpers PA-C, After hours and holidays please check [email or web address] for group call information for [facility] Dispo: TBD. Will likely need SNF. PT/OT recommending SNF. TOC following. Patient came from [place], but family is concerned about the care she is receiving there.    Alfonse Alpers, PA-C 01/08/2020

## 2020-01-08 NOTE — Progress Notes (Signed)
Occupational Therapy Treatment Patient Details Name: [name] MRN: [id number] DOB: [birth year] Today's Date: 01/08/2020    History of present illness [name] is a 79 y.o. female with PMHx: heart failure, CKD 3, Alzheimer's dementia, CVA, seizure, gout, HTN, malnutrition who presents from her nursing facility following a witnessed mechanical fall.  Head CT on 12/4 was remarkable for "Stable chronic small vessel ischemic changes. No acute intracranial process. Pt with R femur fx resulting in IM nail 12/5 to R hip and R proximal humerus fx- non operative.   OT comments  Pt's session focused on RUE HEP, grooming and sitting EOB for balance tasks. Pt limited due to RUE immobilized and pain; RLE painful and poor mobility. Pt requires increased assist for all ADL for safety and due to increased weakness.Pt not intiating movement today and nearly totalA for bed mobility and sit to stands x2 times. Pt would benefit from continued OT skilled services. OT following acutely.   Follow Up Recommendations  SNF;Supervision/Assistance - 24 hour    Equipment Recommendations  None recommended by OT    Recommendations for Other Services      Precautions / Restrictions Precautions Precautions: Fall Precaution Comments: seizure precautions Required Braces or Orthoses: Sling Restrictions Weight Bearing Restrictions: Yes RUE Weight Bearing: Non weight bearing RLE Weight Bearing: Weight bearing as tolerated       Mobility Bed Mobility Overal bed mobility: Needs Assistance Bed Mobility: Supine to Sit;Sit to Supine     Supine to sit: Total assist Sit to supine: Total assist   General bed mobility comments: pt not initiating any movement today  Transfers Overall transfer level: Needs assistance Equipment used: 1 person hand held assist Transfers: Sit to/from Stand Sit to Stand: Total assist         General transfer comment: Pt unable to bear weight or assist to  standing     Balance Overall balance assessment: Needs assistance Sitting-balance support: Feet supported Sitting balance-Leahy Scale: Poor Sitting balance - Comments: maxA for sitting   Standing balance support: Bilateral upper extremity supported Standing balance-Leahy Scale: Zero Standing balance comment: unable to stand upright as BLEs not strong enough to hold pt                           ADL either performed or assessed with clinical judgement   ADL Overall ADL's : Needs assistance/impaired     Grooming: Moderate assistance;Bed level Grooming Details (indicate cue type and reason): Assist for sitting balance, but unable to perform task without bed level in supported sitting                             Functional mobility during ADLs: Total assistance;+2 for physical assistance;+2 for safety/equipment General ADL Comments: Pt's session focused on RUE HEP, grooming and sitting EOB for balance tasks. Pt limited due to RUE immobilized and pain; RLE painful and poor mobility. Pt requires increased assist for all ADL for safety and due to increased weakness.     Vision   Vision Assessment?: No apparent visual deficits   Perception     Praxis      Cognition Arousal/Alertness: Awake/alert Behavior During Therapy: Flat affect Overall Cognitive Status: No family/caregiver present to determine baseline cognitive functioning Area of Impairment: Attention;Following commands;Orientation;Problem solving                 Orientation Level: Disoriented to;Time Current  Attention Level: Sustained Memory: Decreased short-term memory;Decreased recall of precautions Following Commands: Follows one step commands with increased time;Follows one step commands consistently;Follows multi-step commands inconsistently Safety/Judgement: Decreased awareness of safety Awareness: Intellectual Problem Solving: Slow processing General Comments: h/o dementia; following 1  step commands with multimodal cues        Exercises Exercises: Other exercises Other Exercises Other Exercises: elbow, wrist and hand ROM, gentle   Shoulder Instructions       General Comments      Pertinent Vitals/ Pain       Pain Assessment: Faces Faces Pain Scale: Hurts even more Pain Location: R hip; R proximal humerus Pain Descriptors / Indicators: Grimacing;Guarding Pain Intervention(s): Monitored during session  Home Living                                          Prior Functioning/Environment              Frequency  Min 2X/week        Progress Toward Goals  OT Goals(current goals can now be found in the care plan section)  Progress towards OT goals: Progressing toward goals  Acute Rehab OT Goals Patient Stated Goal: agreeable to rehab OT Goal Formulation: Patient unable to participate in goal setting Time For Goal Achievement: 01/20/20 Potential to Achieve Goals: Good ADL Goals Pt Will Perform Eating: with set-up;sitting Pt Will Perform Grooming: with set-up;sitting;bed level Pt Will Perform Upper Body Bathing: with min assist;sitting Pt Will Perform Lower Body Bathing: with min assist;sit to/from stand Pt Will Perform Upper Body Dressing: with min guard assist;sitting Pt Will Transfer to Toilet: with mod assist;stand pivot transfer;bedside commode Pt/caregiver will Perform Home Exercise Program: Increased strength;Increased ROM;Right Upper extremity;With written HEP provided;With minimal assist Additional ADL Goal #1: Pt will complete functional grooming task in non-distracting environment with minimal redirectional cures  Plan Discharge plan remains appropriate    Co-evaluation                 AM-PAC OT "6 Clicks" Daily Activity     Outcome Measure   Help from another person eating meals?: A Little Help from another person taking care of personal grooming?: A Lot Help from another person toileting, which includes  using toliet, bedpan, or urinal?: Total Help from another person bathing (including washing, rinsing, drying)?: A Lot Help from another person to put on and taking off regular upper body clothing?: A Lot Help from another person to put on and taking off regular lower body clothing?: Total 6 Click Score: 11    End of Session Equipment Utilized During Treatment: Gait belt  OT Visit Diagnosis: Unsteadiness on feet (R26.81);Muscle weakness (generalized) (M62.81);Other symptoms and signs involving cognitive function;Pain Symptoms and signs involving cognitive functions: Other cerebrovascular disease Pain - Right/Left: Right Pain - part of body: Leg   Activity Tolerance Patient limited by pain   Patient Left in bed;with call bell/phone within reach;with bed alarm set   Nurse Communication Mobility status        Time: 6948-5462 OT Time Calculation (min): 31 min  Charges: OT General Charges $OT Visit: 1 Visit OT Treatments $Self Care/Home Management : 8-22 mins $Therapeutic Activity: 8-22 mins  Flora Lipps, OTR/L Acute Rehabilitation Services Pager: [phone] Office: [phone]    Coltan Spinello C 01/08/2020, 5:36 PM

## 2020-01-08 NOTE — Progress Notes (Signed)
Nutrition Follow-up  DOCUMENTATION CODES:   Underweight, Severe malnutrition in context of chronic illness  INTERVENTION:   -Continue feeding assistance with meals -Continue MVI with minerals daily -Continue Magic cup TID with meals, each supplement provides 290 kcal and 9 grams of protein -Continue 30 ml Prosource Plus BID, each supplement provides 100 kcals and 15 grams protein  NUTRITION DIAGNOSIS:   Severe Malnutrition related to chronic illness (dementia) as evidenced by moderate fat depletion, severe fat depletion, moderate muscle depletion, severe muscle depletion.  Ongoing  GOAL:   Patient will meet greater than or equal to 90% of their needs  Progressing   MONITOR:   PO intake, Supplement acceptance, Labs, Weight trends, Skin, I & O's  REASON FOR ASSESSMENT:   Consult Assessment of nutrition requirement/status  ASSESSMENT:   [name] is a 79 y.o. female with medical history significant of heart failure, CKD 3, Alzheimer's dementia, CVA, seizure, gout, hypertension, malnutrition who presents from her nursing facility following a witnessed mechanical fall.  12/5- s/p Procedure: Right closed management of a proximal humerus fracture Right hip intramedullary nail placement 12/6- s/p BSE- advanced to regular diet with thin liquids  Reviewed I/O's: -240 ml x 24 hours and +2 L since admission  UOP: 700 ml x 24 hours  Attempted to speak with pt via call to hospital room phone, however, no answer.   Pt's intake has improved. Noted meal completions 75-100%. Pt is taking Prosource Plus supplements.   Pt still with no BM this admission; she has been ordered colace.    Medications reviewed and include colace, keppra, and calcium carbonate.   Per therapy notes, plan for SNF placement once medically stable for discharge.   Labs reviewed.   Diet Order:   Diet Order            Diet regular Room service appropriate? Yes; Fluid consistency: Thin  Diet  effective now                 EDUCATION NEEDS:   Not appropriate for education at this time  Skin:  Skin Assessment: Skin Integrity Issues: Skin Integrity Issues:: Incisions Incisions: closed rt hip  Last BM:  Unknown  Height:   Ht Readings from Last 1 Encounters:  01/03/20 5\' 1"  (1.549 m)    Weight:   Wt Readings from Last 1 Encounters:  01/03/20 36.4 kg    Ideal Body Weight:  47.7 kg  BMI:  There is no height or weight on file to calculate BMI.  Estimated Nutritional Needs:   Kcal:  1450-1650  Protein:  70-85 grams  Fluid:  > 1.4 L    14/03/21, RD, LDN, CDCES Registered Dietitian II Certified Diabetes Care and Education Specialist Please refer to [facility] for RD and/or RD on-call/weekend/after hours pager

## 2020-01-08 NOTE — TOC Progression Note (Signed)
Transition of Care [facility]) - Progression Note    Patient Details  Name: [name] MRN: [id number] Date of Birth: 1940/09/09  [facility] CM/SW Contact  [name], [place] Phone Number: 01/08/2020, 1:43 PM  Clinical Narrative:     CSW called pts son, [name] twice with no answer, csw was unable to leave message due to voicemail being full. CSW sent test message with no response.   CSW called pts daughter [name] to give bed offers. [name] gave csw permission to move forward with bed offer. [name] stated she has a copy of pts vaccination card and will text to csw. [name] stated that [name] lives in [place] and can sign pt in when ready. CSW explained that she will start insurance auth and update when received.   CSW confirmed that [facility] can take pt when ready. CSW called [employer] and started [place], reference number [id number].   Expected Discharge Plan: Skilled Nursing Facility Barriers to Discharge: Continued Medical Work up  Expected Discharge Plan and Services Expected Discharge Plan: Skilled Nursing Facility In-house Referral: Clinical Social Work   Post Acute Care Choice: Skilled Nursing Facility Living arrangements for the past 2 months: Skilled Nursing Facility, Single Family Home (Originally from home. Discharged from hospital to SNF in Dec. 2021)                                       Social Determinants of Health (SDOH) Interventions    Readmission Risk Interventions No flowsheet data found.  [name], Theresia Majors, [place] Clinical Social Worker [phone]

## 2020-01-09 ENCOUNTER — Inpatient Hospital Stay (HOSPITAL_COMMUNITY): Payer: Medicare HMO

## 2020-01-09 DIAGNOSIS — M109 Gout, unspecified: Secondary | ICD-10-CM | POA: Diagnosis not present

## 2020-01-09 DIAGNOSIS — M898X5 Other specified disorders of bone, thigh: Secondary | ICD-10-CM | POA: Diagnosis not present

## 2020-01-09 DIAGNOSIS — S42211A Unspecified displaced fracture of surgical neck of right humerus, initial encounter for closed fracture: Secondary | ICD-10-CM | POA: Diagnosis not present

## 2020-01-09 DIAGNOSIS — N179 Acute kidney failure, unspecified: Secondary | ICD-10-CM | POA: Diagnosis not present

## 2020-01-09 DIAGNOSIS — R4182 Altered mental status, unspecified: Secondary | ICD-10-CM | POA: Diagnosis not present

## 2020-01-09 DIAGNOSIS — N183 Chronic kidney disease, stage 3 unspecified: Secondary | ICD-10-CM | POA: Diagnosis not present

## 2020-01-09 DIAGNOSIS — S42309A Unspecified fracture of shaft of humerus, unspecified arm, initial encounter for closed fracture: Secondary | ICD-10-CM | POA: Diagnosis not present

## 2020-01-09 DIAGNOSIS — I5042 Chronic combined systolic (congestive) and diastolic (congestive) heart failure: Secondary | ICD-10-CM | POA: Diagnosis not present

## 2020-01-09 DIAGNOSIS — I509 Heart failure, unspecified: Secondary | ICD-10-CM | POA: Diagnosis not present

## 2020-01-09 DIAGNOSIS — E43 Unspecified severe protein-calorie malnutrition: Secondary | ICD-10-CM | POA: Diagnosis not present

## 2020-01-09 DIAGNOSIS — Z7401 Bed confinement status: Secondary | ICD-10-CM | POA: Diagnosis not present

## 2020-01-09 DIAGNOSIS — S72001A Fracture of unspecified part of neck of right femur, initial encounter for closed fracture: Secondary | ICD-10-CM | POA: Diagnosis not present

## 2020-01-09 DIAGNOSIS — S72144A Nondisplaced intertrochanteric fracture of right femur, initial encounter for closed fracture: Secondary | ICD-10-CM | POA: Diagnosis not present

## 2020-01-09 DIAGNOSIS — N189 Chronic kidney disease, unspecified: Secondary | ICD-10-CM | POA: Diagnosis not present

## 2020-01-09 DIAGNOSIS — M1A9XX0 Chronic gout, unspecified, without tophus (tophi): Secondary | ICD-10-CM | POA: Diagnosis not present

## 2020-01-09 DIAGNOSIS — M25511 Pain in right shoulder: Secondary | ICD-10-CM | POA: Diagnosis not present

## 2020-01-09 DIAGNOSIS — M2550 Pain in unspecified joint: Secondary | ICD-10-CM | POA: Diagnosis not present

## 2020-01-09 DIAGNOSIS — F0280 Dementia in other diseases classified elsewhere without behavioral disturbance: Secondary | ICD-10-CM | POA: Diagnosis not present

## 2020-01-09 DIAGNOSIS — D649 Anemia, unspecified: Secondary | ICD-10-CM | POA: Diagnosis not present

## 2020-01-09 DIAGNOSIS — F0390 Unspecified dementia without behavioral disturbance: Secondary | ICD-10-CM | POA: Diagnosis not present

## 2020-01-09 DIAGNOSIS — I10 Essential (primary) hypertension: Secondary | ICD-10-CM | POA: Diagnosis not present

## 2020-01-09 DIAGNOSIS — S42351A Displaced comminuted fracture of shaft of humerus, right arm, initial encounter for closed fracture: Secondary | ICD-10-CM | POA: Diagnosis not present

## 2020-01-09 DIAGNOSIS — N289 Disorder of kidney and ureter, unspecified: Secondary | ICD-10-CM | POA: Diagnosis not present

## 2020-01-09 DIAGNOSIS — G309 Alzheimer's disease, unspecified: Secondary | ICD-10-CM | POA: Diagnosis not present

## 2020-01-09 DIAGNOSIS — G40909 Epilepsy, unspecified, not intractable, without status epilepticus: Secondary | ICD-10-CM | POA: Diagnosis not present

## 2020-01-09 DIAGNOSIS — R52 Pain, unspecified: Secondary | ICD-10-CM | POA: Diagnosis not present

## 2020-01-09 LAB — BASIC METABOLIC PANEL
Anion gap: 8 (ref 5–15)
BUN: 40 mg/dL — ABNORMAL HIGH (ref 8–23)
CO2: 24 mmol/L (ref 22–32)
Calcium: 9 mg/dL (ref 8.9–10.3)
Chloride: 103 mmol/L (ref 98–111)
Creatinine, Ser: 1.65 mg/dL — ABNORMAL HIGH (ref 0.44–1.00)
GFR, Estimated: 32 mL/min — ABNORMAL LOW (ref >60)
Glucose, Bld: 112 mg/dL — ABNORMAL HIGH (ref 70–99)
Potassium: 4.9 mmol/L (ref 3.5–5.1)
Sodium: 135 mmol/L (ref 135–145)

## 2020-01-09 LAB — CBC
HCT: 28.9 % — ABNORMAL LOW (ref 36.0–46.0)
Hemoglobin: 9.5 g/dL — ABNORMAL LOW (ref 12.0–15.0)
MCH: 29.9 pg (ref 26.0–34.0)
MCHC: 32.9 g/dL (ref 30.0–36.0)
MCV: 90.9 fL (ref 80.0–100.0)
Platelets: 194 10*3/uL (ref 150–400)
RBC: 3.18 MIL/uL — ABNORMAL LOW (ref 3.87–5.11)
RDW: 18.9 % — ABNORMAL HIGH (ref 11.5–15.5)
WBC: 9.9 10*3/uL (ref 4.0–10.5)
nRBC: 0.5 % — ABNORMAL HIGH (ref 0.0–0.2)

## 2020-01-09 LAB — SARS CORONAVIRUS 2 BY RT PCR (HOSPITAL ORDER, PERFORMED IN ~~LOC~~ HOSPITAL LAB): SARS Coronavirus 2: NEGATIVE

## 2020-01-09 MED ORDER — HEPARIN SODIUM (PORCINE) 5000 UNIT/ML IJ SOLN: 5000.0000 [IU] | Freq: Two times a day (BID) | INTRAMUSCULAR | 0 refills | Status: DC

## 2020-01-09 MED ORDER — POLYETHYLENE GLYCOL 3350 17 G PO PACK: 17.0000 g | PACK | Freq: Every day | ORAL | 0 refills | Status: DC | PRN

## 2020-01-09 MED ORDER — DOCUSATE SODIUM 100 MG PO CAPS: 100.0000 mg | ORAL_CAPSULE | Freq: Two times a day (BID) | ORAL | 0 refills | Status: AC

## 2020-01-09 MED ORDER — PROSOURCE PLUS PO LIQD
30.0000 mL | Freq: Two times a day (BID) | ORAL | Status: DC
Start: 2020-01-10 — End: 2020-04-03

## 2020-01-09 MED ORDER — ADULT MULTIVITAMIN W/MINERALS CH: 1.0000 | ORAL_TABLET | Freq: Every day | ORAL | Status: AC

## 2020-01-09 NOTE — Progress Notes (Signed)
PROGRESS NOTE    [name]  DXI:[id number] DOB: [birth year] DOA: 01/04/2020 PCP: [name], MD   Brief Narrative:  79 year old female with history of chronic heart failure, stage IIIb chronic kidney disease, Alzheimer's dementia, history of stroke, seizures and hypertension brought from nursing home with witnessed fall.  Apparently she was walking on the hallway and seen falling on her right side.  In the emergency room, hemodynamically stable.  Found to have right shoulder fracture and right intertrochanteric fracture of the femur.  She was recently admitted to the hospital and discharged on 12/2 to a skilled nursing facility after adjustment of her seizure medications.  Assessment & Plan:  Right intertrochanteric fracture of femur: -Status post intramedullary nailing on 12/5 by orthopedic surgery.  POD #4 -Continue as needed pain medication -Continue PT/OT recommended SNF -DVT prophylaxis with subcu heparin -Orthopedic recommended weightbearing as tolerated.  Follow-up in 2 weeks with Dr. Everardo Pacific. -Monitor H&H and vitals closely  Right proximal humerus fracture: -Orthopedic recommended nonoperative management. -Recommended nonweightbearing in a sling -Continue as needed pain medications  AKI on CKD stage IIIb: -Improving.  Continue to hold nephrotoxic medication.  Monitor kidney function closely  Chest pain/abnormal EKG: Cardiology consulted recommended no intervention at this time.  Patient denies ACS symptoms.  Continue to monitor  Seizure disorder: Recently admitted for seizure and encephalopathy.  Currently on increased dose of Vimpat 200 mg twice daily, Keppra thousand twice daily.  No recurrent seizure reported during hospitalization.  Alzheimer's dementia: Continue Aricept.  Delirium precautions.  Chronic combined systolic and diastolic CHF: Patient appears euvolemic on exam.  Continue to monitor.  Monitor signs of fluid overload. -Reviewed echo from  12/28/2019 which showed ejection fraction of 40 to 45%. -Not on diuretics at home.  Continue to monitor.  Hypertension: Stable.  Continue Coreg and amlodipine.  Continue to hold ARB in the setting of worsening kidney function.  Acute blood loss anemia: In the setting of recent surgery.  Patient H&H dropped 4.7.  Status post 2 unit PRBC transfusion.  Monitor H&H closely and transfuse as needed. -Continue ferrous sulfate.  History of gout: Continue allopurinol  History of CVA: Continue aspirin  Disposition: Okay to discharge to SNF from orthopedic standpoint and from medical standpoint once bed is available.  DVT prophylaxis: Heparin Code Status: Full code Family Communication:  None present at bedside.  Plan of care discussed with patient in length and she verbalized understanding and agreed with it. Disposition Plan: SNF as soon as bed is available  Consultants:  Orthopedic surgery  Procedures:   Intramedullary nailing  Antimicrobials:   None  Status is: Inpatient  Dispo: The patient is from: SNF              Anticipated d/c is to: SNF              Anticipated d/c date is: 1 day              Patient currently medically stable for the discharge   Subjective: Patient seen and examined.  Resting comfortably on the bed.  Tells me that she has pain/cramps in her right leg however denies swelling, redness, chest pain, shortness of breath, palpitation, fever or chills.  Objective: Vitals:   01/08/20 0355 01/08/20 1502 01/08/20 2008 01/09/20 0445  BP: 127/76 118/83 125/88 116/78  Pulse: 79 81 80 78  Resp: 17 14 18 16   Temp: 98.6 F (37 C) 98.8 F (37.1 C) 98.7 F (37.1 C) 98.6 F (37 C)  TempSrc: Oral  Oral Oral  SpO2: 98% 99% 97% 96%    Intake/Output Summary (Last 24 hours) at 01/09/2020 1335 Last data filed at 01/09/2020 1230 Gross per 24 hour  Intake 580 ml  Output 600 ml  Net -20 ml   There were no vitals filed for this visit.  Examination: General exam:  Appears calm and comfortable, elderly, on room air, communicating well Respiratory system: Clear to auscultation. Respiratory effort normal. Cardiovascular system: S1 & S2 heard, RRR. No JVD, murmurs, rubs, gallops or clicks. No pedal edema. Gastrointestinal system: Abdomen is nondistended, soft and nontender. No organomegaly or masses felt. Normal bowel sounds heard. Central nervous system: Alert and oriented. No focal neurological deficits. Extremities: Dressing dry and intact noted on right upper and lateral aspect of thigh.  No swelling or redness noted.  Power 5 out of 5 in left lower extremity and 2 out of 5 in right lower extremity due to pain.  Tender to palpation on right hip.  Unna boots noted in right foot. Right upper extremity: In sling.  Moves her finger. Psychiatry: Judgement and insight appear normal. Mood & affect appropriate.   Data Reviewed: I have personally reviewed following labs and imaging studies  CBC: Recent Labs  Lab 01/04/20 1754 01/05/20 0442 01/06/20 0310 01/06/20 2006 01/07/20 0519 01/08/20 0151 01/09/20 0224  WBC 13.5* 13.1* 12.5*  --  12.7* 11.5* 9.9  NEUTROABS 11.6*  --  9.8*  --   --   --   --   HGB 11.6* 9.2* 4.7* 9.6* 9.3* 9.0* 9.5*  HCT 35.6* 28.6* 14.5* 28.2* 26.7* 26.7* 28.9*  MCV 100.6* 101.4* 98.6  --  86.7 88.1 90.9  PLT 306 218 173  --  175 186 194   Basic Metabolic Panel: Recent Labs  Lab 01/04/20 1754 01/05/20 0442 01/06/20 0310 01/07/20 0229 01/09/20 0224  NA 141 138 134* 136 135  K 4.7 4.9 4.7 4.4 4.9  CL 106 107 102 103 103  CO2 22 19* 19* 22 24  GLUCOSE 134* 120* 113* 103* 112*  BUN 31* 27* 40* 39* 40*  CREATININE 1.71* 1.49* 2.06* 1.93* 1.65*  CALCIUM 11.0* 9.6 8.1* 8.5* 9.0   GFR: Estimated Creatinine Clearance: 16.4 mL/min (A) (by C-G formula based on SCr of 1.65 mg/dL (H)). Liver Function Tests: Recent Labs  Lab 01/04/20 1754  AST 28  ALT 27  ALKPHOS 57  BILITOT 0.7  PROT 8.3*  ALBUMIN 4.6   No results  for input(s): LIPASE, AMYLASE in the last 168 hours. Recent Labs  Lab 01/04/20 1929  AMMONIA 15   Coagulation Profile: Recent Labs  Lab 01/04/20 1754  INR 1.0   Cardiac Enzymes: No results for input(s): CKTOTAL, CKMB, CKMBINDEX, TROPONINI in the last 168 hours. BNP (last 3 results) No results for input(s): PROBNP in the last 8760 hours. HbA1C: No results for input(s): HGBA1C in the last 72 hours. CBG: Recent Labs  Lab 01/02/20 1601 01/04/20 1750  GLUCAP 99 122*   Lipid Profile: Recent Labs    01/07/20 0229  CHOL 135  HDL 41  LDLCALC 45  TRIG 247*  CHOLHDL 3.3   Thyroid Function Tests: No results for input(s): TSH, T4TOTAL, FREET4, T3FREE, THYROIDAB in the last 72 hours. Anemia Panel: No results for input(s): VITAMINB12, FOLATE, FERRITIN, TIBC, IRON, RETICCTPCT in the last 72 hours. Sepsis Labs: No results for input(s): PROCALCITON, LATICACIDVEN in the last 168 hours.  Recent Results (from the past 240 hour(s))  SARS Coronavirus 2 by RT  PCR (hospital order, performed in [facility] hospital lab) Nasopharyngeal Nasopharyngeal Swab     Status: None   Collection Time: 01/02/20  8:41 AM   Specimen: Nasopharyngeal Swab  Result Value Ref Range Status   SARS Coronavirus 2 NEGATIVE NEGATIVE Final    Comment: (NOTE) SARS-CoV-2 target nucleic acids are NOT DETECTED.  The SARS-CoV-2 RNA is generally detectable in upper and lower respiratory specimens during the acute phase of infection. The lowest concentration of SARS-CoV-2 viral copies this assay can detect is 250 copies / mL. A negative result does not preclude SARS-CoV-2 infection and should not be used as the sole basis for treatment or other patient management decisions.  A negative result may occur with improper specimen collection / handling, submission of specimen other than nasopharyngeal swab, presence of viral mutation(s) within the areas targeted by this assay, and inadequate number of viral copies (<250  copies / mL). A negative result must be combined with clinical observations, patient history, and epidemiological information.  Fact Sheet for Patients:   BoilerBrush.com.cy  Fact Sheet for Healthcare Providers: https://pope.com/  This test is not yet approved or  cleared by the [place] FDA and has been authorized for detection and/or diagnosis of SARS-CoV-2 by FDA under an Emergency Use Authorization (EUA).  This EUA will remain in effect (meaning this test can be used) for the duration of the COVID-19 declaration under Section 564(b)(1) of the Act, 21 U.S.C. section 360bbb-3(b)(1), unless the authorization is terminated or revoked sooner.  Performed at [facility], [address] [place], [place] [address]   Resp Panel by RT-PCR (Flu A&B, Covid) Nasopharyngeal Swab     Status: None   Collection Time: 01/04/20  7:25 PM   Specimen: Nasopharyngeal Swab; Nasopharyngeal(NP) swabs in vial transport medium  Result Value Ref Range Status   SARS Coronavirus 2 by RT PCR NEGATIVE NEGATIVE Final    Comment: (NOTE) SARS-CoV-2 target nucleic acids are NOT DETECTED.  The SARS-CoV-2 RNA is generally detectable in upper respiratory specimens during the acute phase of infection. The lowest concentration of SARS-CoV-2 viral copies this assay can detect is 138 copies/mL. A negative result does not preclude SARS-Cov-2 infection and should not be used as the sole basis for treatment or other patient management decisions. A negative result may occur with  improper specimen collection/handling, submission of specimen other than nasopharyngeal swab, presence of viral mutation(s) within the areas targeted by this assay, and inadequate number of viral copies(<138 copies/mL). A negative result must be combined with clinical observations, patient history, and epidemiological information. The expected result is Negative.  Fact Sheet for  Patients:  BloggerCourse.com  Fact Sheet for Healthcare Providers:  SeriousBroker.it  This test is no t yet approved or cleared by the [place] FDA and  has been authorized for detection and/or diagnosis of SARS-CoV-2 by FDA under an Emergency Use Authorization (EUA). This EUA will remain  in effect (meaning this test can be used) for the duration of the COVID-19 declaration under Section 564(b)(1) of the Act, 21 U.S.C.section 360bbb-3(b)(1), unless the authorization is terminated  or revoked sooner.       Influenza A by PCR NEGATIVE NEGATIVE Final   Influenza B by PCR NEGATIVE NEGATIVE Final    Comment: (NOTE) The Xpert Xpress SARS-CoV-2/FLU/RSV plus assay is intended as an aid in the diagnosis of influenza from Nasopharyngeal swab specimens and should not be used as a sole basis for treatment. Nasal washings and aspirates are unacceptable for Xpert Xpress  SARS-CoV-2/FLU/RSV testing.  Fact Sheet for Patients: BloggerCourse.com  Fact Sheet for Healthcare Providers: SeriousBroker.it  This test is not yet approved or cleared by the [place] FDA and has been authorized for detection and/or diagnosis of SARS-CoV-2 by FDA under an Emergency Use Authorization (EUA). This EUA will remain in effect (meaning this test can be used) for the duration of the COVID-19 declaration under Section 564(b)(1) of the Act, 21 U.S.C. section 360bbb-3(b)(1), unless the authorization is terminated or revoked.  Performed at [facility], [address], [place], [place] [address]   Surgical pcr screen     Status: None   Collection Time: 01/05/20 11:02 AM   Specimen: Nasal Mucosa; Nasal Swab  Result Value Ref Range Status   MRSA, PCR NEGATIVE NEGATIVE Final   Staphylococcus aureus NEGATIVE NEGATIVE Final    Comment: (NOTE) The Xpert SA Assay (FDA approved for NASAL specimens in  patients 54 years of age and older), is one component of a comprehensive surveillance program. It is not intended to diagnose infection nor to guide or monitor treatment. Performed at [facility], [address] [place], [place] [address]       Radiology Studies: DG Shoulder Right  Result Date: 01/09/2020 CLINICAL DATA:  Closed fracture of RIGHT humerus approximately EXAM: RIGHT SHOULDER - 2+ VIEW COMPARISON:  01/04/2020 FINDINGS: Osseous demineralization. AC joint alignment normal. Comminuted fracture of the proximal RIGHT humerus, dominant fracture plane at surgical neck nondisplaced, greater tuberosity fragment mildly displaced. No additional fracture or dislocation seen. Visualized RIGHT ribs intact. IMPRESSION: Comminuted proximal RIGHT humeral fracture unchanged. Electronically Signed   By: Ulyses Southward M.D.   On: 01/09/2020 08:27    Scheduled Meds: . (feeding supplement) PROSource Plus  30 mL Oral BID BM  . allopurinol  50 mg Oral Daily  . amLODipine  10 mg Oral Daily  . calcium carbonate  1,250 mg Oral QPM  . carvedilol  12.5 mg Oral BID WC  . docusate sodium  100 mg Oral BID  . donepezil  5 mg Oral QHS  . ferrous sulfate  325 mg Oral q morning - 10a  . heparin injection (subcutaneous)  5,000 Units Subcutaneous Q12H  . lacosamide  200 mg Oral BID  . levETIRAcetam  1,000 mg Oral BID  . multivitamin with minerals  1 tablet Oral Daily   Continuous Infusions:   LOS: 5 days   Time spent: 40 minutes  Lorilyn Laitinen Estill Cotta, MD [facility]  If 7PM-7AM, please contact night-coverage [email or web address] 01/09/2020, 1:35 PM

## 2020-01-09 NOTE — Progress Notes (Signed)
Called [facility] twice but was transferred to voicemail, operator said they will call

## 2020-01-09 NOTE — TOC Transition Note (Addendum)
Transition of Care [facility]) - CM/SW Discharge Note   Patient Details  Name: [name] MRN: [id number] Date of Birth: 04/03/1940  [facility]) CM/SW Contact:  [name], [place] Phone Number: 01/09/2020, 4:03 PM   Clinical Narrative:     Pt will discharge to [facility] via ptar. Pts covid test is pending. Pts daughter [name] has been notified. CSW made multiple attempts to call pts son, [name]. [facility] has been called.   Nurse to call report to [phone].     Final next level of care: Skilled Nursing Facility Barriers to Discharge: Continued Medical Work up   Patient Goals and CMS Choice Patient states their goals for this hospitalization and ongoing recovery are:: Son in asgreement with continued rehab. Would like another rehab faciity CMS Medicare.gov Compare Post Acute Care list provided to:: Patient Represenative (must comment) (Son provided with information e: [email or web address] Choice offered to / list presented to : Adult Children (Talked with son [name])  Discharge Placement              Patient [facility] bed at: [facility] Patient to be transferred to facility by: Ptar Name of family member notified: Daughter, [name] Patient and family notified of of transfer: 01/09/20  Discharge Plan and Services In-house Referral: Clinical Social Work   Post Acute Care Choice: Skilled Nursing Facility                               Social Determinants of Health (SDOH) Interventions     Readmission Risk Interventions No flowsheet data found.   [name], Theresia Majors, [place] Clinical Social Worker [phone]

## 2020-01-09 NOTE — Progress Notes (Signed)
   ORTHOPAEDIC PROGRESS NOTE  s/p Procedure(s): INTRAMEDULLARY (IM) NAIL INTERTROCHANTRIC on 01/05/2020 with Dr. Everardo Pacific  SUBJECTIVE:  Hip feels better today. Right arm feels sore. No complaints.   OBJECTIVE: PE: General: laying in hospital bed, NAD Right upper extremity: mildly tender to palpation right shoulder. ROM not tested in setting of known fracture. Wiggles her fingers. Warm well perfused hand. Sling in place. Right lower extremity: dressings CDI. Tender to palpation right hip and knee. Wiggles her toes. Warm well perfused foot. Offloader boot in place.    Vitals:   01/08/20 2008 01/09/20 0445  BP: 125/88 116/78  Pulse: 80 78  Resp: 18 16  Temp: 98.7 F (37.1 C) 98.6 F (37 C)  SpO2: 97% 96%     ASSESSMENT: [name] is a 79 y.o. female   1. Right proximal humerus fracture - non operative management  2. Right femur fracture - POD #4  PLAN: Weightbearing:   - RUE: NWB in sling  - RLE: WBAT  Insicional and dressing care:   - RLE: Reinforce dressings as needed Orthopedic device(s):   - RUE: sling Showering: Post-op day #2 with assistance VTE prophylaxis:  Will start on Heparin 5000 units every 12 hours when Hgb stable as recommended by pharmacy. Hgb 9.5 this morning.  Pain control: PRN pain medications, preferring oral medications. Minimize narcotics as able.  Follow - up plan: 2 weeks in office with Dr. Salomon Fick information: Dr. Ramond Marrow, Alfonse Alpers PA-C, After hours and holidays please check [email or web address] for group call information for [facility] Dispo: TBD. Will likely need SNF. PT/OT recommending SNF. TOC following. Patient came from [place], but family is concerned about the care she is receiving there.   Will get repeat xrays of right shoulder to ensure no further displacement. Repeat x-rays show stable proximal humerus fracture. Continue non-operative management with sling and NWB.   Prescriptions for pain medications and DVT  prophylaxis printed and placed in patient's chart. Okay to discharge to SNF from orthopedic standpoint once cleared by medicine and therapies.    Alfonse Alpers, PA-C 01/09/2020

## 2020-01-09 NOTE — Discharge Summary (Signed)
Physician Discharge Summary  [name] ZOX:[id number] DOB: [birth year] DOA: 01/04/2020  PCP: [name], MD  Admit date: 01/04/2020 Discharge date: 01/09/2020  Admitted From: [facility]  disposition:  [facility]   Recommendations for Outpatient Follow-up:  1. Follow-up with PCP in 1 week 2. Repeat CBC and BMP on follow-up visit 3. Hold losartan until follow-up with PCP 4. Follow-up with orthopedic surgery in 2 weeks as a scheduled 5. Norco as needed for pain control 6. Heparin for DVT prophylaxis  Home Health: None Equipment/Devices: None Discharge Condition: Stable CODE STATUS: Full code Diet recommendation: Regular diet  Brief/Interim Summary: 79 year old female with history of chronicheartfailure, stage IIIbchronic kidney disease, Alzheimer's dementia, history of stroke, seizures and hypertension brought from nursing home with witnessed fall. Apparently she was walking on the hallway and seen falling on her right side. In the emergency room, hemodynamically stable. Found to have right shoulder fracture and right intertrochanteric fracture of the femur. She was recently admitted to the hospital and discharged on 12/2 to a skilled nursing facility after adjustment of her seizure medications.  Right intertrochanteric fracture of femur: -Status post intramedullary nailing on 12/5 by orthopedic surgery.  POD #4 -Continued as needed pain medication -Continue PT/OT recommended SNF -DVT prophylaxis with subcu heparin -Orthopedic recommended weightbearing as tolerated.  Follow-up in 2 weeks with Dr. Everardo Pacific.  Okay to discharge to SNF from Ortho standpoint.  Prescription for Norco and heparin printed and placed by Ortho in patient's chart. -Monitored H&H and vitals closely  Right proximal humerus fracture: -Orthopedic recommended nonoperative management. -Recommended nonweightbearing in a sling -Continued as needed pain medications  AKI on  CKD stage IIIb: -Improving.    Held ARB.   -Repeat BMP on follow-up with PCP.  Chest pain/abnormal EKG: Cardiology consulted recommended no intervention at this time.  Patient denies ACS symptoms.    Seizure disorder: Recently admitted for seizure and encephalopathy.  Currently on increased dose of Vimpat 200 mg twice daily, Keppra thousand twice daily.  No recurrent seizure reported during hospitalization.  Alzheimer's dementia: Continued Aricept.  Delirium precautions.  Chronic combined systolic and diastolic CHF: Patient appears euvolemic on exam. -Reviewed echo from 12/28/2019 which showed ejection fraction of 40 to 45%. -Not on diuretics at home.  Continued to monitor.  Hypertension: Remained stable.  Continued Coreg and amlodipine.  Continued to hold ARB in the setting of worsening kidney function.  Acute blood loss anemia: In the setting of recent surgery.  Patient H&H dropped 4.7.  Status post 2 unit PRBC transfusion.    H&H remained stable. -Continued ferrous sulfate.  History of gout: Continued allopurinol  History of CVA: Continued aspirin  Severe malnutrition: -Evaluated by dietitian-recommended feeding assistance with meals, MVI with minerals daily, Magic cup 3 times daily with meals, 30 mL Prosource plus twice daily.  Disposition: Patient discharged to [facility] in a stable condition.  I called patient's son and discussed about discharge and he verbalized understanding.  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  Heparin for DVT prophylaxis   Increase activity slowly   Complete by: As directed    Leave dressing on - Keep it clean, dry, and intact until clinic visit   Complete by: As directed      Allergies as of 01/09/2020   No Known Allergies     Medication List    STOP taking these medications   aspirin 325 MG tablet   losartan 50 MG tablet Commonly known as: COZAAR     TAKE these medications   (feeding supplement) PROSource Plus liquid Take 30 mLs by mouth 2 (two) times daily between meals. Start taking on: January 10, 2020   allopurinol 100 MG tablet Commonly known as: ZYLOPRIM Take 50 mg by mouth daily.   amLODipine 10 MG tablet Commonly known as: NORVASC Take 10 mg by mouth daily.   calcium carbonate 1500 (600 Ca) MG Tabs tablet Commonly known as: OSCAL Take 1,500 mg by mouth every evening.   carvedilol 12.5 MG tablet Commonly known as: COREG Take 12.5 mg by mouth 2 (two) times daily with a meal.   docusate sodium 100 MG capsule Commonly known as: COLACE Take 1 capsule (100 mg total) by mouth 2 (two) times daily.   donepezil 5 MG tablet Commonly known as: ARICEPT Take 5 mg by mouth at bedtime.   FeroSul 325 (65 FE) MG tablet Generic drug: ferrous sulfate Take 325 mg by mouth every morning.   heparin 5000 UNIT/ML injection Inject 1 mL (5,000 Units total) into the skin every 12 (twelve) hours for 14 days. For DVT prophylaxis after surgery   HYDROcodone-acetaminophen 5-325 MG tablet Commonly known as: Norco Take 1 tablet by mouth every 6 (six) hours as needed for moderate pain. MAXIMUM TOTAL ACETAMINOPHEN DOSE IS 4000 MG PER DAY   lacosamide 200 MG Tabs tablet Commonly known as: VIMPAT Take 1 tablet (200 mg total) by mouth 2 (two) times daily.    levETIRAcetam 1000 MG tablet Commonly known as: KEPPRA Take 1 tablet (1,000 mg total) by mouth 2 (two) times daily.   multivitamin with minerals Tabs tablet Take 1 tablet by mouth daily. Start taking on: January 10, 2020   polyethylene glycol 17 g packet Commonly known as: MIRALAX / GLYCOLAX Take 17 g by mouth daily as needed for mild constipation.            Discharge Care Instructions  (From admission, onward)         Start     Ordered   01/09/20 0000  Leave dressing on - Keep it clean, dry, and intact until clinic visit        01/09/20 1548          Contact information for follow-up providers    [name], MD In 1 week.   Specialty: Orthopedic Surgery Why: For wound re-check Contact information: [address] [place] [address] [phone]        [name], MD Follow up in 1 week(s).   Specialty: Family Medicine Contact information: [phone] [address] [place] [address] [phone]        Corky Crafts, MD .   Specialties: Cardiology, Radiology, Interventional Cardiology Contact information: [address] [place] [address] [phone]            Contact information for after-discharge  care    Destination    [facility] .   Service: Skilled Nursing Contact information: [address] [place] [address] [phone]                 No Known Allergies  Consultations:  Orthopedic surgery  Cardiology   Procedures/Studies: CT ANGIO HEAD W OR WO CONTRAST  Result Date: 12/27/2019 CLINICAL DATA:  Initial evaluation for acute stroke. EXAM: CT ANGIOGRAPHY HEAD AND NECK TECHNIQUE: Multidetector CT imaging of the head and neck was performed using the standard protocol during bolus administration of intravenous contrast. Multiplanar CT image reconstructions and MIPs were obtained to evaluate the vascular anatomy.  Carotid stenosis measurements (when applicable) are obtained utilizing NASCET criteria, using the distal internal carotid diameter as the denominator. CONTRAST:  75mL OMNIPAQUE IOHEXOL 350 MG/ML SOLN COMPARISON:  Prior head CT from earlier same day. FINDINGS: CTA NECK FINDINGS Aortic arch: Visualized aortic arch of normal caliber with normal branch pattern. Mild atheromatous change about the aortic arch. No hemodynamically significant stenosis seen about the origin of the great vessels. Right carotid system: Right common and internal carotid arteries are tortuous but widely patent without stenosis, dissection or occlusion. Left carotid system: Left CCA tortuous but widely patent to the bifurcation without stenosis. Atheromatous narrowing at the origin of the left ICA measuring up to 35% by NASCET criteria. Left ICA tortuous but otherwise patent distally to the skull base without stenosis, dissection or occlusion. Vertebral arteries: Both vertebral arteries arise from the subclavian arteries. No proximal subclavian artery stenosis. Right vertebral artery dominant. Vertebral arteries mildly tortuous but patent without stenosis, dissection or occlusion. Skeleton: No acute osseous abnormality. No discrete or worrisome osseous lesions. Other neck: No other acute soft tissue abnormality within the neck. No mass or adenopathy. Upper chest: Small layering bilateral pleural effusions partially visualized. Emphysematous changes noted within the visualized lungs. Review of the MIP images confirms the above findings CTA HEAD FINDINGS Anterior circulation: Petrous segments patent bilaterally. Scattered atheromatous plaque throughout the carotid siphons with no more than mild multifocal stenosis. 3 mm aneurysm extending medially and slightly posteriorly seen arising from the cavernous right ICA (series 7, image 106). Additional subtle 3 mm outpouching extending inferiorly and slightly medially from the cavernous left ICA also  suspicious for aneurysm (series 8, image 89). ICA termini well perfused. A1 segments patent bilaterally. Normal anterior communicating artery complex. Anterior cerebral arteries patent to their distal aspects without stenosis. Atheromatous irregularity within the M1 segments without high-grade stenosis or occlusion. Normal MCA bifurcations. Distal MCA branches well perfused, although demonstrate moderate small vessel atheromatous irregularity. Posterior circulation: Both vertebral arteries patent to the vertebrobasilar junction without stenosis. Both PICA patent. Basilar patent to its distal aspect without stenosis. Superior cerebral arteries patent bilaterally. 3 mm aneurysm seen extending superiorly and slightly posteriorly from the basilar tip (series 7, image 99). Both PCAs primarily supplied via the basilar. Scattered atheromatous irregularity throughout the PCA segments bilaterally without high-grade or flow-limiting stenosis. Both PCAs remain well perfused to their distal aspects. Venous sinuses: Patent. Anatomic variants: None significant. Review of the MIP images confirms the above findings IMPRESSION: 1. Negative CTA for large vessel occlusion. 2. 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 3. Diffuse tortuosity of the major arterial vasculature of the head and neck, suggesting chronic underlying hypertension. 4. 3 mm bilateral cavernous ICA aneurysms, with additional 3 mm basilar tip aneurysm. 5. Small layering bilateral pleural effusions. 6. Emphysema (ICD10-J43.9). Electronically  Signed   By: Rise Mu M.D.   On: 12/27/2019 23:11   DG Chest 2 View  Result Date: 01/04/2020 CLINICAL DATA:  Fall, right hip pain EXAM: CHEST - 2 VIEW COMPARISON:  Radiograph 12/27/2019, 11/17/2017, CT 04/16/2012 FINDINGS: Mild cardiomegaly appearance with marked tortuosity of the brachiocephalic vasculature and a dilated and calcified tortuous aorta with some resulting rightward tracheal deviation  which is overall similar appearance to prior configuration. No acute cardiomediastinal contour abnormalities are seen. No dense apical capping or other features to suggest an acute intra mediastinal process. Some chronic hyperinflation and coarsened interstitial changes are present in the lungs. No consolidation, features of edema, pneumothorax, or effusion. Interval progression of the midthoracic compression deformity is overall age indeterminate. Questionable deformity of the right radius and ulna though possibly projectional as these are incidentally included on the lateral radiograph. IMPRESSION: 1. No acute cardiopulmonary abnormality. 2. Chronic tortuosity and dilatation of aorta with rightward tracheal deviation. 3. Interval progression of a previously seen midthoracic compression deformity is overall age indeterminate. Correlate with point tenderness. 4. Questionable deformity of the radius and ulna though possibly projectional as these are incidentally included on the lateral radiograph. Laterality uncertain. Correlate with clinical symptoms and consider dedicated views as warranted. Electronically Signed   By: Kreg Shropshire M.D.   On: 01/04/2020 17:00   DG Pelvis 1-2 Views  Result Date: 01/04/2020 CLINICAL DATA:  Fall EXAM: PELVIS - 1-2 VIEW COMPARISON:  CT 04/16/2012 FINDINGS: Comminuted, mildly displaced and impacted inter trochanteric femur fracture. Additional suspicious lucency noted in the right femoral head neck junction as well could reflect an additional fracture line. Femoral head remains normally located. Left femur is intact. Remaining bones of the pelvis are intact and congruent though portions of the sacrum are obscured by overlying bowel gas. Transitional lumbosacral vertebrae noted incidentally. Right lateral hip swelling, possible hematoma and probable hip effusion. IMPRESSION: 1. Comminuted, mildly displaced and impacted intertrochanteric right femur fracture. 2. Additional suspicious  lucency in the right femoral head neck junction could reflect an additional subcapital right femoral fracture line. 3. Right lateral hip swelling, possible hematoma and probable hip effusion. Electronically Signed   By: Kreg Shropshire M.D.   On: 01/04/2020 16:55   DG Shoulder Right  Result Date: 01/09/2020 CLINICAL DATA:  Closed fracture of RIGHT humerus approximately EXAM: RIGHT SHOULDER - 2+ VIEW COMPARISON:  01/04/2020 FINDINGS: Osseous demineralization. AC joint alignment normal. Comminuted fracture of the proximal RIGHT humerus, dominant fracture plane at surgical neck nondisplaced, greater tuberosity fragment mildly displaced. No additional fracture or dislocation seen. Visualized RIGHT ribs intact. IMPRESSION: Comminuted proximal RIGHT humeral fracture unchanged. Electronically Signed   By: Ulyses Southward M.D.   On: 01/09/2020 08:27   DG Shoulder Right  Result Date: 01/04/2020 CLINICAL DATA:  Pain after fall. EXAM: RIGHT SHOULDER - 2+ VIEW COMPARISON:  None. FINDINGS: There is a comminuted fracture of the humeral head and neck with mild displacement. No humeral head dislocation. No other acute abnormalities. IMPRESSION: Comminuted fracture of the humeral head neck with mild displacement. Electronically Signed   By: Gerome Sam III M.D   On: 01/04/2020 12:12   DG Forearm Right  Result Date: 01/04/2020 CLINICAL DATA:  Fall.  Right arm pain. EXAM: RIGHT FOREARM - 2 VIEW COMPARISON:  None. FINDINGS: No fracture or bone lesion. Elbow and wrist joints are normally spaced and aligned. Soft tissues are unremarkable. IMPRESSION: Negative. Electronically Signed   By: Amie Portland M.D.   On: 01/04/2020 18:30  CT Head Wo Contrast  Result Date: 01/04/2020 CLINICAL DATA:  Ground level fall EXAM: CT HEAD WITHOUT CONTRAST TECHNIQUE: Contiguous axial images were obtained from the base of the skull through the vertex without intravenous contrast. COMPARISON:  12/27/2019 FINDINGS: Brain: Confluent hypodensities  throughout the periventricular and subcortical white matter consistent with chronic small vessel ischemic changes, stable. No signs of acute infarct or hemorrhage. Lateral ventricles and midline structures are otherwise unremarkable. No acute extra-axial fluid collections. No mass effect. Vascular: No hyperdense vessel or unexpected calcification. Skull: Normal. Negative for fracture or focal lesion. Sinuses/Orbits: Mild chronic mucosal thickening left sphenoid sinus. Remaining sinuses are clear. Other: None. IMPRESSION: 1. Stable chronic small vessel ischemic changes. No acute intracranial process. Electronically Signed   By: Sharlet Salina M.D.   On: 01/04/2020 17:17   CT Head Wo Contrast  Result Date: 12/27/2019 CLINICAL DATA:  Delirium. EXAM: CT HEAD WITHOUT CONTRAST TECHNIQUE: Contiguous axial images were obtained from the base of the skull through the vertex without intravenous contrast. COMPARISON:  MRI July 29 21, CT head July 23, 2018. FINDINGS: Brain: No evidence of acute large vascular territory infarction, hemorrhage, hydrocephalus, extra-axial collection or mass lesion/mass effect. Moderate to advanced scattered patchy hypodensity throughout the white matter, compatible with chronic microvascular ischemic disease. Similar remote lacunar infarcts involving bilateral basal ganglia and right thalamus. Similar high paramedian left frontal cortical infarct. Generalized atrophy. Vascular: Calcific atherosclerosis. Skull: No acute fracture. Sinuses/Orbits: No acute finding. Other: No mastoid effusions. IMPRESSION: 1. No evidence of acute intracranial abnormality. 2. Similar moderate to advanced chronic microvascular ischemic disease and remote infarcts. Electronically Signed   By: Feliberto Harts MD   On: 12/27/2019 15:40   CT ANGIO NECK W OR WO CONTRAST  Result Date: 12/27/2019 CLINICAL DATA:  Initial evaluation for acute stroke. EXAM: CT ANGIOGRAPHY HEAD AND NECK TECHNIQUE: Multidetector CT  imaging of the head and neck was performed using the standard protocol during bolus administration of intravenous contrast. Multiplanar CT image reconstructions and MIPs were obtained to evaluate the vascular anatomy. Carotid stenosis measurements (when applicable) are obtained utilizing NASCET criteria, using the distal internal carotid diameter as the denominator. CONTRAST:  75mL OMNIPAQUE IOHEXOL 350 MG/ML SOLN COMPARISON:  Prior head CT from earlier same day. FINDINGS: CTA NECK FINDINGS Aortic arch: Visualized aortic arch of normal caliber with normal branch pattern. Mild atheromatous change about the aortic arch. No hemodynamically significant stenosis seen about the origin of the great vessels. Right carotid system: Right common and internal carotid arteries are tortuous but widely patent without stenosis, dissection or occlusion. Left carotid system: Left CCA tortuous but widely patent to the bifurcation without stenosis. Atheromatous narrowing at the origin of the left ICA measuring up to 35% by NASCET criteria. Left ICA tortuous but otherwise patent distally to the skull base without stenosis, dissection or occlusion. Vertebral arteries: Both vertebral arteries arise from the subclavian arteries. No proximal subclavian artery stenosis. Right vertebral artery dominant. Vertebral arteries mildly tortuous but patent without stenosis, dissection or occlusion. Skeleton: No acute osseous abnormality. No discrete or worrisome osseous lesions. Other neck: No other acute soft tissue abnormality within the neck. No mass or adenopathy. Upper chest: Small layering bilateral pleural effusions partially visualized. Emphysematous changes noted within the visualized lungs. Review of the MIP images confirms the above findings CTA HEAD FINDINGS Anterior circulation: Petrous segments patent bilaterally. Scattered atheromatous plaque throughout the carotid siphons with no more than mild multifocal stenosis. 3 mm aneurysm  extending medially and slightly posteriorly  seen arising from the cavernous right ICA (series 7, image 106). Additional subtle 3 mm outpouching extending inferiorly and slightly medially from the cavernous left ICA also suspicious for aneurysm (series 8, image 89). ICA termini well perfused. A1 segments patent bilaterally. Normal anterior communicating artery complex. Anterior cerebral arteries patent to their distal aspects without stenosis. Atheromatous irregularity within the M1 segments without high-grade stenosis or occlusion. Normal MCA bifurcations. Distal MCA branches well perfused, although demonstrate moderate small vessel atheromatous irregularity. Posterior circulation: Both vertebral arteries patent to the vertebrobasilar junction without stenosis. Both PICA patent. Basilar patent to its distal aspect without stenosis. Superior cerebral arteries patent bilaterally. 3 mm aneurysm seen extending superiorly and slightly posteriorly from the basilar tip (series 7, image 99). Both PCAs primarily supplied via the basilar. Scattered atheromatous irregularity throughout the PCA segments bilaterally without high-grade or flow-limiting stenosis. Both PCAs remain well perfused to their distal aspects. Venous sinuses: Patent. Anatomic variants: None significant. Review of the MIP images confirms the above findings IMPRESSION: 1. Negative CTA for large vessel occlusion. 2. 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 3. Diffuse tortuosity of the major arterial vasculature of the head and neck, suggesting chronic underlying hypertension. 4. 3 mm bilateral cavernous ICA aneurysms, with additional 3 mm basilar tip aneurysm. 5. Small layering bilateral pleural effusions. 6. Emphysema (ICD10-J43.9). Electronically Signed   By: Rise Mu M.D.   On: 12/27/2019 23:11   CT Cervical Spine Wo Contrast  Result Date: 01/04/2020 CLINICAL DATA:  Ground level fall, right arm pain EXAM: CT CERVICAL SPINE  WITHOUT CONTRAST TECHNIQUE: Multidetector CT imaging of the cervical spine was performed without intravenous contrast. Multiplanar CT image reconstructions were also generated. COMPARISON:  None. FINDINGS: Alignment: Alignment is anatomic. Skull base and vertebrae: No acute fracture. No primary bone lesion or focal pathologic process. Soft tissues and spinal canal: No prevertebral fluid or swelling. No visible canal hematoma. Disc levels: Mild multilevel spondylosis greatest at C2-3, C4-5, and C5-6. Upper chest: Airway is patent.  Lung apices are clear. Other: Reconstructed images demonstrate no additional findings. IMPRESSION: 1. Mild cervical spondylosis.  No acute fractures. Electronically Signed   By: Sharlet Salina M.D.   On: 01/04/2020 17:19   MR BRAIN WO CONTRAST  Result Date: 12/29/2019 CLINICAL DATA:  Seizure. Left lateralized periodic discharges on EEG. EXAM: MRI HEAD WITHOUT CONTRAST TECHNIQUE: Multiplanar, multiecho pulse sequences of the brain and surrounding structures were obtained without intravenous contrast. COMPARISON:  Head CT and CTA 12/27/2019 and MRI 08/29/2019 FINDINGS: Brain: No acute infarct, mass, midline shift, or extra-axial fluid collection is identified. Numerous chronic microhemorrhages throughout both cerebral hemispheres have not significantly changed from the prior MRI, located both superficially as well as within the deep gray nuclei. There is a single focus of chronic microhemorrhage in the right cerebellum which was not apparent on the prior MRI. A chronic infarct is again noted in the medial left frontoparietal region/cingulate gyrus with associated chronic blood products. There are also unchanged chronic lacunar infarcts in the basal ganglia, deep cerebral white matter bilaterally, and right thalamus. Patchy and confluent T2 hyperintensities elsewhere in the cerebral white matter and pons are unchanged and nonspecific but compatible with severe chronic small vessel  ischemic disease. A small chronic right cerebellar infarct is unchanged. There is moderate generalized cerebral atrophy. Dedicated temporal lobe imaging is motion degraded which limits detailed assessment, however the hippocampi are grossly symmetric in size and signal. Vascular: Major intracranial vascular flow voids are preserved. Skull  and upper cervical spine: Unremarkable bone marrow signal. Sinuses/Orbits: Bilateral cataract extraction. Chronic left sphenoid sinusitis. Clear mastoid air cells. Other: None. IMPRESSION: 1. No acute intracranial abnormality. 2. Severe chronic small vessel ischemic disease with multiple chronic infarcts and chronic microhemorrhages as above. Electronically Signed   By: Sebastian Ache M.D.   On: 12/29/2019 18:58   CT SHOULDER RIGHT WO CONTRAST  Result Date: 01/04/2020 CLINICAL DATA:  Shoulder injury after fall EXAM: CT OF THE UPPER RIGHT EXTREMITY WITHOUT CONTRAST TECHNIQUE: Multidetector CT imaging of the upper right extremity was performed according to the standard protocol. COMPARISON:  None. FINDINGS: Bones/Joint/Cartilage Comminuted mildly impacted fracture seen through the proximal humerus involving the surgical neck, greater and lesser tuberosities. There is slight impaction of the humeral shaft on the humeral head. The humeral head still articulates with the glenoid. No AC joint widening is seen. No other fracture is identified. Ligaments Suboptimally assessed by CT. Muscles and Tendons There is edema seen surrounding the deltoid musculature. There is suboptimal visualization of the tendons, however the supraspinatus tendon appears to be grossly intact. Soft tissues Tissue swelling seen along the lateral aspect of the shoulder. IMPRESSION: Comminuted mildly impacted fracture seen through the surgical neck and proximal humerus with slight impaction. Overlying soft tissue swelling and deltoid musculature edema. Electronically Signed   By: Jonna Clark M.D.   On: 01/04/2020  20:37   DG Chest Port 1 View  Result Date: 12/27/2019 CLINICAL DATA:  Unresponsive EXAM: PORTABLE CHEST 1 VIEW COMPARISON:  11/17/2017, 05/19/2015 FINDINGS: No focal opacity or pleural effusion. Mild cardiomegaly. Enlarged mediastinal silhouette, likely augmented by patient rotation. IMPRESSION: Mild cardiomegaly. Enlarged mediastinal silhouette, likely augmented by patient rotation. Electronically Signed   By: Jasmine Pang M.D.   On: 12/27/2019 15:10   DG Humerus Right  Result Date: 01/04/2020 CLINICAL DATA:  Fall.  Right shoulder pain. EXAM: RIGHT HUMERUS - 2+ VIEW COMPARISON:  None. FINDINGS: Comminuted fracture of the proximal humerus. There is a transverse fracture across the metaphysis with a secondary fracture across the base of the greater tuberosity. Greater tuberosity component is displaced laterally by 5 mm. No other fracture displacement. Glenohumeral and elbow joints are normally aligned as is the [facility] joint. Soft tissue swelling about the shoulder and proximal arm. IMPRESSION: 1. Mildly comminuted fracture of the proximal humerus, with mild lateral displacement of the greater tuberosity component. No other fractures and no dislocation. Electronically Signed   By: Amie Portland M.D.   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 Electrical activity was acquired at a sampling rate of 500Hz  and reviewed with a high frequency filter of 70Hz  and a low frequency filter of  1Hz . EEG data were recorded continuously and digitally stored. BACKGROUND ACTIVITY: Posterior dominant rhythm: There is a poorly sustained posterior dominant rhythm of 10 Hz seen at times. Slowing: Generalized irregular delta and theta range activity are present throughout the study. There is also focal irregular left posterior quadrant slow activity. EPILEPTIFORM ACTIVITY: Interictal epileptiform activity: Lateralized periodic discharges(LPDs) with spike or polyspike morphology, sometimes  with accompanying slow wave are seen at P7 > P3,O1 with a frequency of 1 Hz. Ictal Activity: None OTHER EVENTS: None ACTIVATION PROCEDURES: Hyperventilation and photic stimulation were not performed. IMPRESSION: This study recorded a pattern consistent with severe cortical irritability in the left parieto-occipital region. There was no definite seizure recorded, though the pattern does lie on the ictal-interictal continuum. Ritta Slot, MD [facility] [phone] If 7pm- 7am, please page neurology on call as listed in AMION.   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 EEG to evaluate for seizure Level of alertness: Awake, asleep AEDs during EEG study: LEV, LM Technical aspects: This EEG study was done with scalp electrodes positioned according to the 10-20 International system of electrode placement. Electrical activity was acquired at a sampling rate of 500Hz  and reviewed with a high frequency filter of 70Hz  and a low frequency filter of 1Hz . EEG data were recorded continuously and digitally stored. Description: The posterior dominant rhythm consists of 10 Hz activity of moderate voltage  (25-35 uV) seen predominantly in posterior head regions, symmetric and reactive to eye opening and eye closing.  Sleep was characterized by vertex waves, sleep spindles (12 to 14 Hz), maximal frontocentral region.  EEG showed continuous generalized and maximal left posterior quadrant 3 to 6 Hz theta-delta slowing. Lateralized periodic discharges with spike or polyspike morphology were seen in left posterior quadrant, maximal P7 > P3,O1, at 0.25-1hz .  Hyperventilation and photic stimulation were not performed.   ABNORMALITY -Continuous slow, generalized and maximal left posterior quadrant - Lateralized epileptiform discharges, left posterior quadrant IMPRESSION: This study showed evidence of epileptogenicity as well as cortical dysfunction arising from left posterior quadrant likely due to underlying structural abnormality. The LPDs area seen at 0.25-1hz  which is on the ictal-interictal continuum with higher potential for seizures.  Additionally there is mild to moderate diffuse encephalopathy, non specific etiology. No seizures were seen throughout the recording. Charlsie Quest   DG C-Arm 1-60 Min  Result Date: 01/05/2020 CLINICAL DATA:  Right femoral fracture EXAM: OPERATIVE RIGHT HIP WITH PELVIS; DG C-ARM 1-60 MIN COMPARISON:  None. FLUOROSCOPY TIME:  Radiation Exposure Index (as provided by the fluoroscopic device): Not available If the device does not provide the exposure index: Fluoroscopy Time:  47 seconds Number of Acquired Images:  4 FINDINGS: Medullary rod is noted proximally. Fixation screw are seen. Fracture fragments are in near anatomic alignment. IMPRESSION: ORIF of right femoral fracture Electronically Signed   By: Alcide Clever M.D.   On: 01/05/2020 12:59   MM 3D SCREEN BREAST BILATERAL  Addendum Date: 12/16/2019   ADDENDUM REPORT: 12/16/2019 16:40 ADDENDUM: The report should read as follows: CLINICAL DATA:  Screening. EXAM: DIGITAL SCREENING BILATERAL MAMMOGRAM WITH TOMO AND CAD  COMPARISON:  Previous exam(s). ACR Breast Density Category d: The breast tissue is extremely dense, which lowers the sensitivity of mammography FINDINGS: There are no findings suspicious for malignancy. Images were processed with CAD. IMPRESSION: No mammographic evidence of malignancy. A result letter of this screening mammogram will be mailed directly to the patient. RECOMMENDATION: Screening mammogram in one year. (Code:SM-B-01Y) BI-RADS CATEGORY  1: Negative. Electronically Signed   By: Beckie Salts M.D.   On: 12/16/2019 16:40   Result Date: 12/16/2019 :  Please Insert Correct Screening Template Electronically Signed: By: Beckie Salts M.D. On: 12/16/2019 12:01   ECHOCARDIOGRAM COMPLETE BUBBLE STUDY  Result Date: 12/28/2019    ECHOCARDIOGRAM REPORT   Patient Name:   [name] Date of Exam: 12/28/2019 Medical Rec #:  [id number]              Height:       61.0 in Accession #:    [id number]             Weight:       80.2 lb Date of Birth:  1940-10-18              BSA:          1.281 m Patient Age:    79 years               BP:           175/95 mmHg Patient Gender: F                      HR:           73 bpm. Exam Location:  Inpatient Procedure: 2D Echo, Cardiac Doppler, Color Doppler and Saline Contrast Bubble            Study Indications:    Stroke 434.91/ I63.9  History:        Patient has prior history of Echocardiogram examinations, most                 recent 10/16/2019. Seizure.  Sonographer:    Roosvelt Maser RDCS Referring Phys: [id number] Deno [facility] IMPRESSIONS  1. Left ventricular ejection fraction, by estimation, is 40 to 45%. The left ventricle has mildly decreased function. The left ventricle demonstrates global hypokinesis. Left ventricular diastolic parameters were normal.  2. Right ventricular systolic function is normal. The right ventricular size is normal. There is mildly elevated pulmonary artery systolic pressure.  3. Left atrial size was moderately dilated.  4. The mitral  valve is abnormal. Trivial mitral valve regurgitation.  5. The aortic valve is tricuspid. Aortic valve regurgitation is mild. Mild aortic valve sclerosis is present, with no evidence of aortic valve stenosis.  6. The inferior vena cava is normal in size with greater than 50% respiratory variability, suggesting right atrial pressure of 3 mmHg.  7. Agitated saline contrast bubble study was negative, with no evidence of any interatrial shunt. Comparison(s): 10/16/19: LVEF 30-35%, global HK. FINDINGS  Left Ventricle: Left ventricular ejection fraction, by estimation, is 40 to 45%. The left ventricle has mildly decreased function. The left ventricle demonstrates global hypokinesis. The left ventricular internal cavity size was normal in size. There is  no left ventricular hypertrophy. Left ventricular diastolic function could not be evaluated due to indeterminate diastolic function. Left ventricular diastolic parameters were normal. Right Ventricle: The right ventricular size is normal. No increase in right ventricular wall thickness. Right ventricular systolic function is normal. There is mildly elevated pulmonary artery systolic pressure. The tricuspid regurgitant velocity is 3.01  m/s, and with an assumed right atrial pressure of 3 mmHg, the estimated right ventricular systolic pressure is 39.2 mmHg. Left Atrium: Left atrial size was moderately dilated. Right Atrium: Right atrial size was normal in size. Pericardium: There is no evidence of pericardial effusion. Mitral Valve: The mitral valve is abnormal. There is mild thickening of the mitral valve leaflet(s). Trivial mitral valve regurgitation. Tricuspid Valve: The tricuspid valve is grossly normal. Tricuspid valve  regurgitation is mild. Aortic Valve: The aortic valve is tricuspid. Aortic valve regurgitation is mild. Mild aortic valve sclerosis is present, with no evidence of aortic valve stenosis. Pulmonic Valve: The pulmonic valve was grossly normal. Pulmonic valve  regurgitation is trivial. Aorta: The aortic root and ascending aorta are structurally normal, with no evidence of dilitation. Venous: The inferior vena cava is normal in size with greater than 50% respiratory variability, suggesting right atrial pressure of 3 mmHg. IAS/Shunts: No atrial level shunt detected by color flow Doppler. Agitated saline contrast was given intravenously to evaluate for intracardiac shunting. Agitated saline contrast bubble study was negative, with no evidence of any interatrial shunt.  LEFT VENTRICLE PLAX 2D LVIDd:         3.30 cm     Diastology LVIDs:         2.40 cm     LV e' lateral: 4.13 cm/s LV PW:         0.80 cm LV IVS:        0.90 cm LVOT diam:     1.90 cm LV SV:         37 LV SV Index:   29 LVOT Area:     2.84 cm  LV Volumes (MOD) LV vol d, MOD A2C: 52.1 ml LV vol d, MOD A4C: 53.3 ml LV vol s, MOD A2C: 29.9 ml LV vol s, MOD A4C: 31.7 ml LV SV MOD A2C:     22.2 ml LV SV MOD A4C:     53.3 ml LV SV MOD BP:      23.1 ml RIGHT VENTRICLE RV Basal diam:  3.30 cm RV S prime:     10.40 cm/s TAPSE (M-mode): 1.4 cm LEFT ATRIUM             Index       RIGHT ATRIUM           Index LA diam:        2.80 cm 2.19 cm/m  RA Area:     15.00 cm LA Vol (A2C):   64.7 ml 50.50 ml/m RA Volume:   40.30 ml  31.45 ml/m LA Vol (A4C):   44.1 ml 34.42 ml/m LA Biplane Vol: 58.4 ml 45.58 ml/m  AORTIC VALVE LVOT Vmax:   65.30 cm/s LVOT Vmean:  41.700 cm/s LVOT VTI:    0.132 m  AORTA Ao Root diam: 3.50 cm Ao Asc diam:  3.20 cm TRICUSPID VALVE TR Peak grad:   36.2 mmHg TR Vmax:        301.00 cm/s  SHUNTS Systemic VTI:  0.13 m Systemic Diam: 1.90 cm Zoila Shutter MD Electronically signed by Zoila Shutter MD Signature Date/Time: 12/28/2019/12:27:39 PM    Final    DG Hip Port Unilat With Pelvis 1V Right  Result Date: 01/05/2020 CLINICAL DATA:  Status post ORIF of right femoral fracture. EXAM: DG HIP (WITH OR WITHOUT PELVIS) 1V PORT RIGHT COMPARISON:  01/04/2020, intraoperative films from earlier in the same  day. FINDINGS: Medullary rod is now noted in the proximal right femur. Fixation screws are seen. Fracture fragments are in near anatomic alignment. IMPRESSION: Status post ORIF of right femoral fracture. Electronically Signed   By: Alcide Clever M.D.   On: 01/05/2020 12:58   DG HIP OPERATIVE UNILAT W OR W/O PELVIS RIGHT  Result Date: 01/05/2020 CLINICAL DATA:  Right femoral fracture EXAM: OPERATIVE RIGHT HIP WITH PELVIS; DG C-ARM 1-60 MIN COMPARISON:  None. FLUOROSCOPY TIME:  Radiation Exposure Index (as provided  by the fluoroscopic device): Not available If the device does not provide the exposure index: Fluoroscopy Time:  47 seconds Number of Acquired Images:  4 FINDINGS: Medullary rod is noted proximally. Fixation screw are seen. Fracture fragments are in near anatomic alignment. IMPRESSION: ORIF of right femoral fracture Electronically Signed   By: Alcide Clever M.D.   On: 01/05/2020 12:59   DG Femur Min 2 Views Left  Result Date: 01/04/2020 CLINICAL DATA:  Larey Seat from ground level.  Leg pain. EXAM: LEFT FEMUR 2 VIEWS COMPARISON:  06/28/2013 FINDINGS: No fracture or bone lesion. Hip and knee joints are normally spaced and aligned. There scattered femoral artery vascular calcifications. Soft tissues otherwise unremarkable. IMPRESSION: No fracture or dislocation. Electronically Signed   By: Amie Portland M.D.   On: 01/04/2020 18:28   DG Femur Min 2 Views Right  Result Date: 01/04/2020 CLINICAL DATA:  Ground level fall.  Right knee/leg pain. EXAM: RIGHT FEMUR 2 VIEWS COMPARISON:  None. FINDINGS: Non comminuted intertrochanteric fracture of the proximal right femur, with no significant displacement, but with mild varus angulation. No other fractures. Hip and knee joints are normally aligned. Skeletal structures are demineralized. IMPRESSION: 1. Non comminuted, nondisplaced intertrochanteric fracture of the proximal right femur with mild varus angulation. No dislocation. Electronically Signed   By: Amie Portland M.D.   On: 01/04/2020 18:27   VAS [place] UPPER EXTREMITY VENOUS DUPLEX  Result Date: 01/01/2020 UPPER VENOUS STUDY  Indications: Edema Risk Factors: None identified. Limitations: Body habitus. Comparison Study: No prior studies. Performing Technologist: Chanda Busing RVT  Examination Guidelines: A complete evaluation includes B-mode imaging, spectral Doppler, color Doppler, and power Doppler as needed of all accessible portions of each vessel. Bilateral testing is considered an integral part of a complete examination. Limited examinations for reoccurring indications may be performed as noted.  Right Findings: +----------+------------+---------+-----------+----------+-------+ RIGHT     CompressiblePhasicitySpontaneousPropertiesSummary +----------+------------+---------+-----------+----------+-------+ Subclavian    Full       Yes       Yes                      +----------+------------+---------+-----------+----------+-------+  Left Findings: +----------+------------+---------+-----------+----------+-------+ LEFT      CompressiblePhasicitySpontaneousPropertiesSummary +----------+------------+---------+-----------+----------+-------+ IJV           Full       Yes       Yes                      +----------+------------+---------+-----------+----------+-------+ Subclavian    Full       Yes       Yes                      +----------+------------+---------+-----------+----------+-------+ Axillary      Full       Yes       Yes                      +----------+------------+---------+-----------+----------+-------+ Brachial      Full       Yes       Yes                      +----------+------------+---------+-----------+----------+-------+ Radial        Full                                          +----------+------------+---------+-----------+----------+-------+  Ulnar         Full                                           +----------+------------+---------+-----------+----------+-------+ Cephalic      Full                                          +----------+------------+---------+-----------+----------+-------+ Basilic       Full                                          +----------+------------+---------+-----------+----------+-------+  Summary:  Right: No evidence of thrombosis in the subclavian.  Left: No evidence of deep vein thrombosis in the upper extremity. No evidence of superficial vein thrombosis in the upper extremity.  *See table(s) above for measurements and observations.  Diagnosing physician: Coral Else MD Electronically signed by Coral Else MD on 01/01/2020 at 8:56:02 PM.    Final       Subjective: Patient seen and examined.  Complaining of crampy pain in her right leg.  Denies redness, swelling, chest pain, shortness of breath, fever, chills, nausea, vomiting, lightheadedness or dizziness.  She tells me that she is comfortable to go to nursing home today.  Discharge Exam: Vitals:   01/09/20 0445 01/09/20 1405  BP: 116/78 131/84  Pulse: 78 79  Resp: 16 16  Temp: 98.6 F (37 C) 98.1 F (36.7 C)  SpO2: 96% 99%   Vitals:   01/08/20 1502 01/08/20 2008 01/09/20 0445 01/09/20 1405  BP: 118/83 125/88 116/78 131/84  Pulse: 81 80 78 79  Resp: Temp: 98.8 F (37.1 C) 98.7 F (37.1 C) 98.6 F (37 C) 98.1 F (36.7 C)  TempSrc:  Oral Oral   SpO2: 99% 97% 96% 99%    General: Pt is alert, awake, not in acute distress, elderly, on room air, communicating well, thin and lean. Cardiovascular: RRR, S1/S2 +, no rubs, no gallops Respiratory: CTA bilaterally, no wheezing, no rhonchi Abdominal: Soft, NT, ND, bowel sounds + Extremities: Dressing dry and intact in right upper lateral expect of thigh. Right arm in sling.  The results of significant diagnostics from this hospitalization (including imaging, microbiology, ancillary and laboratory) are listed below for  reference.     Microbiology: Recent Results (from the past 240 hour(s))  SARS Coronavirus 2 by RT PCR (hospital order, performed in [facility] hospital lab) Nasopharyngeal Nasopharyngeal Swab     Status: None   Collection Time: 01/02/20  8:41 AM   Specimen: Nasopharyngeal Swab  Result Value Ref Range Status   SARS Coronavirus 2 NEGATIVE NEGATIVE Final    Comment: (NOTE) SARS-CoV-2 target nucleic acids are NOT DETECTED.  The SARS-CoV-2 RNA is generally detectable in upper and lower respiratory specimens during the acute phase of infection. The lowest concentration of SARS-CoV-2 viral copies this assay can detect is 250 copies / mL. A negative result does not preclude SARS-CoV-2 infection and should not be used as the sole basis for treatment or other patient management decisions.  A negative result may occur with improper specimen collection / handling, submission of specimen other than nasopharyngeal swab, presence of viral mutation(s) within the  areas targeted by this assay, and inadequate number of viral copies (<250 copies / mL). A negative result must be combined with clinical observations, patient history, and epidemiological information.  Fact Sheet for Patients:   BoilerBrush.com.cy  Fact Sheet for Healthcare Providers: https://pope.com/  This test is not yet approved or  cleared by the [place] FDA and has been authorized for detection and/or diagnosis of SARS-CoV-2 by FDA under an Emergency Use Authorization (EUA).  This EUA will remain in effect (meaning this test can be used) for the duration of the COVID-19 declaration under Section 564(b)(1) of the Act, 21 U.S.C. section 360bbb-3(b)(1), unless the authorization is terminated or revoked sooner.  Performed at [facility], [address], [place], [place] [address]   Resp Panel by RT-PCR (Flu A&B, Covid) Nasopharyngeal Swab     Status: None   Collection  Time: 01/04/20  7:25 PM   Specimen: Nasopharyngeal Swab; Nasopharyngeal(NP) swabs in vial transport medium  Result Value Ref Range Status   SARS Coronavirus 2 by RT PCR NEGATIVE NEGATIVE Final    Comment: (NOTE) SARS-CoV-2 target nucleic acids are NOT DETECTED.  The SARS-CoV-2 RNA is generally detectable in upper respiratory specimens during the acute phase of infection. The lowest concentration of SARS-CoV-2 viral copies this assay can detect is 138 copies/mL. A negative result does not preclude SARS-Cov-2 infection and should not be used as the sole basis for treatment or other patient management decisions. A negative result may occur with  improper specimen collection/handling, submission of specimen other than nasopharyngeal swab, presence of viral mutation(s) within the areas targeted by this assay, and inadequate number of viral copies(<138 copies/mL). A negative result must be combined with clinical observations, patient history, and epidemiological information. The expected result is Negative.  Fact Sheet for Patients:  BloggerCourse.com  Fact Sheet for Healthcare Providers:  SeriousBroker.it  This test is no t yet approved or cleared by the [place] FDA and  has been authorized for detection and/or diagnosis of SARS-CoV-2 by FDA under an Emergency Use Authorization (EUA). This EUA will remain  in effect (meaning this test can be used) for the duration of the COVID-19 declaration under Section 564(b)(1) of the Act, 21 U.S.C.section 360bbb-3(b)(1), unless the authorization is terminated  or revoked sooner.       Influenza A by PCR NEGATIVE NEGATIVE Final   Influenza B by PCR NEGATIVE NEGATIVE Final    Comment: (NOTE) The Xpert Xpress SARS-CoV-2/FLU/RSV plus assay is intended as an aid in the diagnosis of influenza from Nasopharyngeal swab specimens and should not be used as a sole basis for treatment. Nasal washings  and aspirates are unacceptable for Xpert Xpress SARS-CoV-2/FLU/RSV testing.  Fact Sheet for Patients: BloggerCourse.com  Fact Sheet for Healthcare Providers: SeriousBroker.it  This test is not yet approved or cleared by the [place] FDA and has been authorized for detection and/or diagnosis of SARS-CoV-2 by FDA under an Emergency Use Authorization (EUA). This EUA will remain in effect (meaning this test can be used) for the duration of the COVID-19 declaration under Section 564(b)(1) of the Act, 21 U.S.C. section 360bbb-3(b)(1), unless the authorization is terminated or revoked.  Performed at [facility], [address], [place], [place] [address]   Surgical pcr screen     Status: None   Collection Time: 01/05/20 11:02 AM   Specimen: Nasal Mucosa; Nasal Swab  Result Value Ref Range Status   MRSA, PCR NEGATIVE NEGATIVE Final   Staphylococcus aureus NEGATIVE NEGATIVE  Final    Comment: (NOTE) The Xpert SA Assay (FDA approved for NASAL specimens in patients 25 years of age and older), is one component of a comprehensive surveillance program. It is not intended to diagnose infection nor to guide or monitor treatment. Performed at [facility], [address] [place], [place] [address]      Labs: BNP (last 3 results) Recent Labs    12/27/19 2305  BNP 1,391.1*   Basic Metabolic Panel: Recent Labs  Lab 01/04/20 1754 01/05/20 0442 01/06/20 0310 01/07/20 0229 01/09/20 0224  NA 141 138 134* 136 135  K 4.7 4.9 4.7 4.4 4.9  CL 106 107 102 103 103  CO2 22 19* 19* 22 24  GLUCOSE 134* 120* 113* 103* 112*  BUN 31* 27* 40* 39* 40*  CREATININE 1.71* 1.49* 2.06* 1.93* 1.65*  CALCIUM 11.0* 9.6 8.1* 8.5* 9.0   Liver Function Tests: Recent Labs  Lab 01/04/20 1754  AST 28  ALT 27  ALKPHOS 57  BILITOT 0.7  PROT 8.3*  ALBUMIN 4.6   No results for input(s): LIPASE, AMYLASE in the last 168 hours. Recent  Labs  Lab 01/04/20 1929  AMMONIA 15   CBC: Recent Labs  Lab 01/04/20 1754 01/05/20 0442 01/06/20 0310 01/06/20 2006 01/07/20 0519 01/08/20 0151 01/09/20 0224  WBC 13.5* 13.1* 12.5*  --  12.7* 11.5* 9.9  NEUTROABS 11.6*  --  9.8*  --   --   --   --   HGB 11.6* 9.2* 4.7* 9.6* 9.3* 9.0* 9.5*  HCT 35.6* 28.6* 14.5* 28.2* 26.7* 26.7* 28.9*  MCV 100.6* 101.4* 98.6  --  86.7 88.1 90.9  PLT 306 218 173  --  175 186 194   Cardiac Enzymes: No results for input(s): CKTOTAL, CKMB, CKMBINDEX, TROPONINI in the last 168 hours. BNP: Invalid input(s): POCBNP CBG: Recent Labs  Lab 01/02/20 1601 01/04/20 1750  GLUCAP 99 122*   D-Dimer No results for input(s): DDIMER in the last 72 hours. Hgb A1c No results for input(s): HGBA1C in the last 72 hours. Lipid Profile Recent Labs    01/07/20 0229  CHOL 135  HDL 41  LDLCALC 45  TRIG 247*  CHOLHDL 3.3   Thyroid function studies No results for input(s): TSH, T4TOTAL, T3FREE, THYROIDAB in the last 72 hours.  Invalid input(s): FREET3 Anemia work up No results for input(s): VITAMINB12, FOLATE, FERRITIN, TIBC, IRON, RETICCTPCT in the last 72 hours. Urinalysis    Component Value Date/Time   COLORURINE YELLOW 01/04/2020 2236   APPEARANCEUR CLEAR 01/04/2020 2236   LABSPEC 1.013 01/04/2020 2236   PHURINE 5.0 01/04/2020 2236   GLUCOSEU NEGATIVE 01/04/2020 2236   HGBUR SMALL (A) 01/04/2020 2236   BILIRUBINUR NEGATIVE 01/04/2020 2236   KETONESUR NEGATIVE 01/04/2020 2236   PROTEINUR 100 (A) 01/04/2020 2236   UROBILINOGEN 0.2 03/05/2014 1324   NITRITE NEGATIVE 01/04/2020 2236   LEUKOCYTESUR TRACE (A) 01/04/2020 2236   Sepsis Labs Invalid input(s): PROCALCITONIN,  WBC,  LACTICIDVEN Microbiology Recent Results (from the past 240 hour(s))  SARS Coronavirus 2 by RT PCR (hospital order, performed in [facility] hospital lab) Nasopharyngeal Nasopharyngeal Swab     Status: None   Collection Time: 01/02/20  8:41 AM   Specimen:  Nasopharyngeal Swab  Result Value Ref Range Status   SARS Coronavirus 2 NEGATIVE NEGATIVE Final    Comment: (NOTE) SARS-CoV-2 target nucleic acids are NOT DETECTED.  The SARS-CoV-2 RNA is generally detectable in upper and lower respiratory specimens during the acute phase  of infection. The lowest concentration of SARS-CoV-2 viral copies this assay can detect is 250 copies / mL. A negative result does not preclude SARS-CoV-2 infection and should not be used as the sole basis for treatment or other patient management decisions.  A negative result may occur with improper specimen collection / handling, submission of specimen other than nasopharyngeal swab, presence of viral mutation(s) within the areas targeted by this assay, and inadequate number of viral copies (<250 copies / mL). A negative result must be combined with clinical observations, patient history, and epidemiological information.  Fact Sheet for Patients:   BoilerBrush.com.cy  Fact Sheet for Healthcare Providers: https://pope.com/  This test is not yet approved or  cleared by the [place] FDA and has been authorized for detection and/or diagnosis of SARS-CoV-2 by FDA under an Emergency Use Authorization (EUA).  This EUA will remain in effect (meaning this test can be used) for the duration of the COVID-19 declaration under Section 564(b)(1) of the Act, 21 U.S.C. section 360bbb-3(b)(1), unless the authorization is terminated or revoked sooner.  Performed at [facility], [address] [place], [place] [address]   Resp Panel by RT-PCR (Flu A&B, Covid) Nasopharyngeal Swab     Status: None   Collection Time: 01/04/20  7:25 PM   Specimen: Nasopharyngeal Swab; Nasopharyngeal(NP) swabs in vial transport medium  Result Value Ref Range Status   SARS Coronavirus 2 by RT PCR NEGATIVE NEGATIVE Final    Comment: (NOTE) SARS-CoV-2 target nucleic acids are NOT  DETECTED.  The SARS-CoV-2 RNA is generally detectable in upper respiratory specimens during the acute phase of infection. The lowest concentration of SARS-CoV-2 viral copies this assay can detect is 138 copies/mL. A negative result does not preclude SARS-Cov-2 infection and should not be used as the sole basis for treatment or other patient management decisions. A negative result may occur with  improper specimen collection/handling, submission of specimen other than nasopharyngeal swab, presence of viral mutation(s) within the areas targeted by this assay, and inadequate number of viral copies(<138 copies/mL). A negative result must be combined with clinical observations, patient history, and epidemiological information. The expected result is Negative.  Fact Sheet for Patients:  BloggerCourse.com  Fact Sheet for Healthcare Providers:  SeriousBroker.it  This test is no t yet approved or cleared by the [place] FDA and  has been authorized for detection and/or diagnosis of SARS-CoV-2 by FDA under an Emergency Use Authorization (EUA). This EUA will remain  in effect (meaning this test can be used) for the duration of the COVID-19 declaration under Section 564(b)(1) of the Act, 21 U.S.C.section 360bbb-3(b)(1), unless the authorization is terminated  or revoked sooner.       Influenza A by PCR NEGATIVE NEGATIVE Final   Influenza B by PCR NEGATIVE NEGATIVE Final    Comment: (NOTE) The Xpert Xpress SARS-CoV-2/FLU/RSV plus assay is intended as an aid in the diagnosis of influenza from Nasopharyngeal swab specimens and should not be used as a sole basis for treatment. Nasal washings and aspirates are unacceptable for Xpert Xpress SARS-CoV-2/FLU/RSV testing.  Fact Sheet for Patients: BloggerCourse.com  Fact Sheet for Healthcare Providers: SeriousBroker.it  This test is not yet  approved or cleared by the [place] FDA and has been authorized for detection and/or diagnosis of SARS-CoV-2 by FDA under an Emergency Use Authorization (EUA). This EUA will remain in effect (meaning this test can be used) for the duration of the COVID-19 declaration under Section 564(b)(1) of the Act, 21 U.S.C.  section 360bbb-3(b)(1), unless the authorization is terminated or revoked.  Performed at [facility], [address] [place], [place] [address]   Surgical pcr screen     Status: None   Collection Time: 01/05/20 11:02 AM   Specimen: Nasal Mucosa; Nasal Swab  Result Value Ref Range Status   MRSA, PCR NEGATIVE NEGATIVE Final   Staphylococcus aureus NEGATIVE NEGATIVE Final    Comment: (NOTE) The Xpert SA Assay (FDA approved for NASAL specimens in patients 73 years of age and older), is one component of a comprehensive surveillance program. It is not intended to diagnose infection nor to guide or monitor treatment. Performed at [facility], [address] [place], [place] [address]      Time coordinating discharge: Over 30 minutes  SIGNED:   Ollen Bowl, MD  [facility] 01/09/2020, 3:57 PM Pager   If 7PM-7AM, please contact night-coverage [email or web address]

## 2020-01-09 NOTE — Progress Notes (Signed)
Attempted multiple times to give report but to no avail.

## 2020-01-09 NOTE — Progress Notes (Signed)
Physical Therapy Treatment Patient Details Name: [name] MRN: [id number] DOB: Aug 12, 1940 Today's Date: 01/09/2020    History of Present Illness [name] is a 79 y.o. female with PMHx: heart failure, CKD 3, Alzheimer's dementia, CVA, seizure, gout, HTN, malnutrition who presents from her nursing facility following a witnessed mechanical fall.  Head CT on 12/4 was remarkable for "Stable chronic small vessel ischemic changes. No acute intracranial process. Pt with R femur fx resulting in IM nail 12/5 to R hip and R proximal humerus fx- non operative.    PT Comments    The pt is making good progress with mobility and PT goals at this time. She was able to complete multiple transfers OOB during this session to [facility] and recliner, but continues to require mod/maxA of 2 to power up to standing, steady, and allow for small lateral steps during the pivot. The pt also remains limited by pain, and will continue to benefit from skilled PT to progress functional strength, stability, and endurance for further OOB transfers/mobility.     Follow Up Recommendations  Supervision/Assistance - 24 hour;SNF     Equipment Recommendations  None recommended by PT    Recommendations for Other Services       Precautions / Restrictions Precautions Precautions: Fall Precaution Comments: seizure precautions Required Braces or Orthoses: Sling Restrictions Weight Bearing Restrictions: No RUE Weight Bearing: Non weight bearing RLE Weight Bearing: Weight bearing as tolerated    Mobility  Bed Mobility Overal bed mobility: Needs Assistance Bed Mobility: Supine to Sit     Supine to sit: Max assist;HOB elevated     General bed mobility comments: pt able to follow some commands to initiate LE movement to EOB, but then benefits from maxA of 2 to complete elevation of trunk and complete scooting to EOB. pt with strong lean to offweight R hip  Transfers Overall transfer level: Needs  assistance Equipment used: 2 person hand held assist Transfers: Sit to/from [employer] Sit to Stand: Max assist;+2 physical assistance Stand pivot transfers: Max assist;+2 physical assistance       General transfer comment: pt able to come to standign with asignificant assist of 2, incontinent of urine in standing, maxA to [facility]. With second stand pivot to recliner, pt able to tolerate with modA of 2 with some initiation of lateral steps  Ambulation/Gait Ambulation/Gait assistance: Mod assist;+2 physical assistance Gait Distance (Feet): 3 Feet Assistive device: 2 person hand held assist Gait Pattern/deviations: Step-to pattern;Decreased stride length;Antalgic Gait velocity: reduced Gait velocity interpretation: <1.31 ft/sec, indicative of household ambulator General Gait Details: initially unable to move BLE to step, with second pivot to recliner, pt able to initiate lateral steps with modA of 2. minimal clearance, assist to facilitate lean/wt shift      Balance Overall balance assessment: Needs assistance Sitting-balance support: Feet supported Sitting balance-Leahy Scale: Poor Sitting balance - Comments: minA to maintain balance in static sitting Postural control: Posterior lean;Left lateral lean Standing balance support: Single extremity supported Standing balance-Leahy Scale: Poor Standing balance comment: able to tolerate standing with mod/maxA of 2                            Cognition Arousal/Alertness: Awake/alert Behavior During Therapy: Flat affect Overall Cognitive Status: No family/caregiver present to determine baseline cognitive functioning Area of Impairment: Attention;Following commands;Orientation;Problem solving                 Orientation Level: Disoriented  to;Time Current Attention Level: Sustained Memory: Decreased short-term memory;Decreased recall of precautions Following Commands: Follows one step commands with increased  time;Follows one step commands consistently;Follows multi-step commands inconsistently Safety/Judgement: Decreased awareness of safety Awareness: Intellectual Problem Solving: Slow processing General Comments: h/o dementia, but following simple commands with increased time. unable to maintain NWB RUE despite repeated cues      Exercises      General Comments General comments (skin integrity, edema, etc.): VSS on RA      Pertinent Vitals/Pain Pain Assessment: Faces Faces Pain Scale: Hurts even more Pain Location: R hip; R proximal humerus Pain Descriptors / Indicators: Grimacing;Guarding Pain Intervention(s): Limited activity within patient's tolerance;Monitored during session;Repositioned           PT Goals (current goals can now be found in the care plan section) Acute Rehab PT Goals Patient Stated Goal: agreeable to rehab PT Goal Formulation: Patient unable to participate in goal setting Time For Goal Achievement: 01/21/20 Potential to Achieve Goals: Fair Progress towards PT goals: Progressing toward goals    Frequency    Min 2X/week      PT Plan Current plan remains appropriate       AM-PAC PT "6 Clicks" Mobility   Outcome Measure  Help needed turning from your back to your side while in a flat bed without using bedrails?: A Lot Help needed moving from lying on your back to sitting on the side of a flat bed without using bedrails?: A Lot Help needed moving to and from a bed to a chair (including a wheelchair)?: A Lot Help needed standing up from a chair using your arms (e.g., wheelchair or bedside chair)?: A Lot Help needed to walk in hospital room?: Total Help needed climbing 3-5 steps with a railing? : Total 6 Click Score: 10    End of Session Equipment Utilized During Treatment: Gait belt Activity Tolerance: Patient tolerated treatment well Patient left: in chair;with call bell/phone within reach;with chair alarm set Nurse Communication: Mobility  status PT Visit Diagnosis: Other symptoms and signs involving the nervous system (R29.898);History of falling (Z91.81);Other abnormalities of gait and mobility (R26.89)     Time: 1037-1100 PT Time Calculation (min) (ACUTE ONLY): 23 min  Charges:  $Gait Training: 8-22 mins $Therapeutic Activity: 8-22 mins                     Rolm Baptise, PT, DPT   Acute Rehabilitation Department Pager #: [phone]   Gaetana Michaelis 01/09/2020, 5:12 PM

## 2020-01-09 NOTE — Discharge Instructions (Signed)
°  Ramond Marrow MD, MPH Alfonse Alpers, PA-C [facility] [address], [address] [phone] (tel)   [phone] (fax)   POST-OPERATIVE INSTRUCTIONS - HIP  WOUND CARE - You may remove dressing on Sunday, December 12.  - Keep steri strips in place - If steri strips fall off, please place new ones - Okay to leave open to air - Gently pat the area dry after bathing .  - Do not soak the shoulder in water. Do not go swimming in the pool or ocean until 4 weeks after surgery - KEEP THE INCISIONS CLEAN AND DRY.  EXERCISES - Follow the instructions of your therapist.  No specific exercises necessary outside of this. - You may bear weight on your operative leg. - Please continue to ambulate and do not stay sitting or lying for too long. Perform foot and wrist pumps to assist in circulation.  POST- OP MEDICATIONS  ? Norco - This is a strong narcotic, to be used only on an as needed basis for pain. ? Lovenox  - This medicine is used to minimize the risk of blood clots after surgery.   FOLLOW-UP - If you develop a Fever (>101.5), Redness or Drainage from the surgical incision site, please call our office to arrange for an evaluation. - Please call the office to schedule a follow-up appointment for your incision check, 10-14 days post-operatively.  IF YOU HAVE ANY QUESTIONS, PLEASE FEEL FREE TO CALL OUR OFFICE.  HELPFUL INFORMATION  You should wean off your narcotic medicines as soon as you are able.  Most patients will be off or using minimal narcotics before their first postop appointment.   We suggest you use the pain medication the first night prior to going to bed, in order to ease any pain when the anesthesia wears off. You should avoid taking pain medications on an empty stomach as it will make you nauseous.  Do not drink alcoholic beverages or take illicit drugs when taking pain medications.  Pain medication may make you constipated.  Below are a few  solutions to try in this order: Decrease the amount of pain medication if you aren't having pain. Drink lots of decaffeinated fluids. Drink prune juice and/or each dried prunes  If the first 3 don't work start with additional solutions Take Colace - an over-the-counter stool softener Take Senokot - an over-the-counter laxative Take Miralax - a stronger over-the-counter laxative  Shoulder: - Sling at all times except when bathing, dressings, exercises - Okay to perform elbow, wrist, hand, ROM exercises with physical therapy - NO RANGE OF MOTION OF THE RIGHT SHOULER

## 2020-01-09 NOTE — TOC Progression Note (Signed)
Transition of Care [facility]) - Progression Note    Patient Details  Name: [name] MRN: [id number] Date of Birth: 05-29-1940  Transition of Care [facility]) CM/SW Contact  [name], [place] Phone Number: 01/09/2020, 11:21 AM  Clinical Narrative:     CSW received call from [employer] stating pt is regular [employer]. CSW informed [facility] they will have to get auth.   CSW tried once again to get in contact with brother, [name] but phone went straight to voicemail. CSW informed [name] that someone will have to sign paperwork, Latoya agreed to sign since we are unable to get in touch with [name].   TOC will follow.  Expected Discharge Plan: Skilled Nursing Facility Barriers to Discharge: Continued Medical Work up  Expected Discharge Plan and Services Expected Discharge Plan: Skilled Nursing Facility In-house Referral: Clinical Social Work   Post Acute Care Choice: Skilled Nursing Facility Living arrangements for the past 2 months: Skilled Nursing [facility] (Originally from home. Discharged from hospital to SNF in Dec. 2021)                                       Social Determinants of Health (SDOH) Interventions    Readmission Risk Interventions No flowsheet data found.  [name], Theresia Majors, [place] Clinical Social Worker [phone]

## 2020-01-10 DIAGNOSIS — I10 Essential (primary) hypertension: Secondary | ICD-10-CM | POA: Diagnosis not present

## 2020-01-10 DIAGNOSIS — S72001A Fracture of unspecified part of neck of right femur, initial encounter for closed fracture: Secondary | ICD-10-CM | POA: Diagnosis not present

## 2020-01-10 DIAGNOSIS — S42309A Unspecified fracture of shaft of humerus, unspecified arm, initial encounter for closed fracture: Secondary | ICD-10-CM | POA: Diagnosis not present

## 2020-01-10 DIAGNOSIS — N183 Chronic kidney disease, stage 3 unspecified: Secondary | ICD-10-CM | POA: Diagnosis not present

## 2020-01-10 DIAGNOSIS — E43 Unspecified severe protein-calorie malnutrition: Secondary | ICD-10-CM | POA: Diagnosis not present

## 2020-01-10 DIAGNOSIS — G40909 Epilepsy, unspecified, not intractable, without status epilepticus: Secondary | ICD-10-CM | POA: Diagnosis not present

## 2020-01-10 DIAGNOSIS — I509 Heart failure, unspecified: Secondary | ICD-10-CM | POA: Diagnosis not present

## 2020-01-10 DIAGNOSIS — N179 Acute kidney failure, unspecified: Secondary | ICD-10-CM | POA: Diagnosis not present

## 2020-01-10 DIAGNOSIS — F0390 Unspecified dementia without behavioral disturbance: Secondary | ICD-10-CM | POA: Diagnosis not present

## 2020-01-21 DIAGNOSIS — M898X5 Other specified disorders of bone, thigh: Secondary | ICD-10-CM | POA: Diagnosis not present

## 2020-01-21 DIAGNOSIS — M25511 Pain in right shoulder: Secondary | ICD-10-CM | POA: Diagnosis not present

## 2020-02-03 ENCOUNTER — Other Ambulatory Visit: Payer: Self-pay

## 2020-02-03 NOTE — Patient Outreach (Signed)
[facility]) Care Management  02/03/2020  Leyani Gargus Jun 10, 1940 [id number]   Referral Date: 02/03/20 Referral Source: [employer] Report Date of Discharge: 01/30/20 Facility:  [facility] Home Insurance: [facility]   Referral received.  No outreach warranted at this time.  Transition of Care calls being completed via EMMI. RN CM will outreach patient for any red flags received.    Plan: RN CM will close case.    Bary Leriche, RN, MSN [facility] Care Management Care Management Coordinator Direct Line [phone] Toll Free: [phone]  Fax: [phone]

## 2020-02-11 DIAGNOSIS — D631 Anemia in chronic kidney disease: Secondary | ICD-10-CM | POA: Diagnosis not present

## 2020-02-11 DIAGNOSIS — I130 Hypertensive heart and chronic kidney disease with heart failure and stage 1 through stage 4 chronic kidney disease, or unspecified chronic kidney disease: Secondary | ICD-10-CM | POA: Diagnosis not present

## 2020-02-11 DIAGNOSIS — N1832 Chronic kidney disease, stage 3b: Secondary | ICD-10-CM | POA: Diagnosis not present

## 2020-02-11 DIAGNOSIS — F0280 Dementia in other diseases classified elsewhere without behavioral disturbance: Secondary | ICD-10-CM | POA: Diagnosis not present

## 2020-02-11 DIAGNOSIS — S42201D Unspecified fracture of upper end of right humerus, subsequent encounter for fracture with routine healing: Secondary | ICD-10-CM | POA: Diagnosis not present

## 2020-02-11 DIAGNOSIS — G309 Alzheimer's disease, unspecified: Secondary | ICD-10-CM | POA: Diagnosis not present

## 2020-02-11 DIAGNOSIS — I5042 Chronic combined systolic (congestive) and diastolic (congestive) heart failure: Secondary | ICD-10-CM | POA: Diagnosis not present

## 2020-02-11 DIAGNOSIS — S72144D Nondisplaced intertrochanteric fracture of right femur, subsequent encounter for closed fracture with routine healing: Secondary | ICD-10-CM | POA: Diagnosis not present

## 2020-02-11 DIAGNOSIS — G40909 Epilepsy, unspecified, not intractable, without status epilepticus: Secondary | ICD-10-CM | POA: Diagnosis not present

## 2020-02-13 ENCOUNTER — Telehealth: Payer: Self-pay | Admitting: Adult Health

## 2020-02-13 MED ORDER — LACOSAMIDE 200 MG PO TABS: 200.0000 mg | ORAL_TABLET | Freq: Two times a day (BID) | ORAL | 5 refills | Status: DC

## 2020-02-13 NOTE — Telephone Encounter (Signed)
[email or web address] is asking the script for lacosamide (VIMPAT) 200 MG TABS tablet be faxed to them [phone] Fax#[phone]

## 2020-02-13 NOTE — Telephone Encounter (Signed)
Fax confirmation received to [facility] [phone], I relayed to Chastity that I did fax and they will be able to see (may be 24 hours).

## 2020-02-13 NOTE — Telephone Encounter (Addendum)
I called and spoketo Kaitlyn from [facility] pharmacy.  From [facility] records pt has been  Increased on her vimpat to 200mg  po bid.  Has been receiving from prescriptions by Dr. Endocrinology.  Last fill 01/23/20 #28 [facility] pharmacy in the [facility] drug registry.  Latoya daughter? spoke with them at [facility] for needing to renew.  I LMVM for latoya with her mobile #.  Pt is now residing at [facility].  I called and LMVM [facility] [phone] to call back.

## 2020-02-13 NOTE — Telephone Encounter (Signed)
See other note

## 2020-02-13 NOTE — Telephone Encounter (Signed)
The pt had a seizure 11/26 and went to the hospital and was admitted. She went to PT at a nursing home and the Doctor at the nursing home changed her dosage(Dr. Rene Paci from [facility] facility) and the daughter [name] can confirm they increased from 100mg  to 200mg . The daughter is looking for a refill. The problem is they can't locate the Dr so if she needs to get her to come into the for an appt to be looked at by [facility] and reassessed she would like schedule as soon as possible. . Are you ok to prescribe 200mg  po vimpat?

## 2020-02-13 NOTE — Telephone Encounter (Signed)
[name],  The pt had a seizure 11/26 and went to the hospital and was admitted. She went to PT at a nursing home and the Doctor at the nursing home changed her dosage(Dr. Rene Paci from [facility] facility) and the daughter [name] can confirm they increased from 100mg  to 200mg . The daughter is looking for a refill. The problem is they can't locate the Dr so if she needs to get her to come into the for an appt to be looked at by [facility] and reassessed she would like schedule as soon as possible. .  Thank you

## 2020-02-13 NOTE — Telephone Encounter (Signed)
Prescription printed and signed

## 2020-02-13 NOTE — Addendum Note (Signed)
Addended by: Enedina Finner on: 02/13/2020 05:17 PM   Modules accepted: Orders

## 2020-02-19 DIAGNOSIS — S42201D Unspecified fracture of upper end of right humerus, subsequent encounter for fracture with routine healing: Secondary | ICD-10-CM | POA: Diagnosis not present

## 2020-02-19 DIAGNOSIS — N1832 Chronic kidney disease, stage 3b: Secondary | ICD-10-CM | POA: Diagnosis not present

## 2020-02-19 DIAGNOSIS — D631 Anemia in chronic kidney disease: Secondary | ICD-10-CM | POA: Diagnosis not present

## 2020-02-19 DIAGNOSIS — G40909 Epilepsy, unspecified, not intractable, without status epilepticus: Secondary | ICD-10-CM | POA: Diagnosis not present

## 2020-02-19 DIAGNOSIS — F0280 Dementia in other diseases classified elsewhere without behavioral disturbance: Secondary | ICD-10-CM | POA: Diagnosis not present

## 2020-02-19 DIAGNOSIS — S72144D Nondisplaced intertrochanteric fracture of right femur, subsequent encounter for closed fracture with routine healing: Secondary | ICD-10-CM | POA: Diagnosis not present

## 2020-02-19 DIAGNOSIS — I5042 Chronic combined systolic (congestive) and diastolic (congestive) heart failure: Secondary | ICD-10-CM | POA: Diagnosis not present

## 2020-02-19 DIAGNOSIS — I130 Hypertensive heart and chronic kidney disease with heart failure and stage 1 through stage 4 chronic kidney disease, or unspecified chronic kidney disease: Secondary | ICD-10-CM | POA: Diagnosis not present

## 2020-02-19 DIAGNOSIS — G309 Alzheimer's disease, unspecified: Secondary | ICD-10-CM | POA: Diagnosis not present

## 2020-02-20 DIAGNOSIS — G40909 Epilepsy, unspecified, not intractable, without status epilepticus: Secondary | ICD-10-CM | POA: Diagnosis not present

## 2020-02-20 DIAGNOSIS — S72144D Nondisplaced intertrochanteric fracture of right femur, subsequent encounter for closed fracture with routine healing: Secondary | ICD-10-CM | POA: Diagnosis not present

## 2020-02-20 DIAGNOSIS — S42201D Unspecified fracture of upper end of right humerus, subsequent encounter for fracture with routine healing: Secondary | ICD-10-CM | POA: Diagnosis not present

## 2020-02-20 DIAGNOSIS — F0280 Dementia in other diseases classified elsewhere without behavioral disturbance: Secondary | ICD-10-CM | POA: Diagnosis not present

## 2020-02-20 DIAGNOSIS — I5042 Chronic combined systolic (congestive) and diastolic (congestive) heart failure: Secondary | ICD-10-CM | POA: Diagnosis not present

## 2020-02-20 DIAGNOSIS — D631 Anemia in chronic kidney disease: Secondary | ICD-10-CM | POA: Diagnosis not present

## 2020-02-20 DIAGNOSIS — N1832 Chronic kidney disease, stage 3b: Secondary | ICD-10-CM | POA: Diagnosis not present

## 2020-02-20 DIAGNOSIS — I130 Hypertensive heart and chronic kidney disease with heart failure and stage 1 through stage 4 chronic kidney disease, or unspecified chronic kidney disease: Secondary | ICD-10-CM | POA: Diagnosis not present

## 2020-02-20 DIAGNOSIS — G309 Alzheimer's disease, unspecified: Secondary | ICD-10-CM | POA: Diagnosis not present

## 2020-02-20 NOTE — Telephone Encounter (Signed)
Refaxed again to # listed.  Received confirmation.

## 2020-02-20 NOTE — Telephone Encounter (Signed)
Chastity from [facility] called to state that they still haven't received lacosamide (VIMPAT) 200 MG TABS tablet prescription.   Fax number: [phone]       Phone number: [phone]

## 2020-02-25 DIAGNOSIS — G309 Alzheimer's disease, unspecified: Secondary | ICD-10-CM | POA: Diagnosis not present

## 2020-02-25 DIAGNOSIS — I5042 Chronic combined systolic (congestive) and diastolic (congestive) heart failure: Secondary | ICD-10-CM | POA: Diagnosis not present

## 2020-02-25 DIAGNOSIS — S72144D Nondisplaced intertrochanteric fracture of right femur, subsequent encounter for closed fracture with routine healing: Secondary | ICD-10-CM | POA: Diagnosis not present

## 2020-02-25 DIAGNOSIS — F0280 Dementia in other diseases classified elsewhere without behavioral disturbance: Secondary | ICD-10-CM | POA: Diagnosis not present

## 2020-02-25 DIAGNOSIS — D631 Anemia in chronic kidney disease: Secondary | ICD-10-CM | POA: Diagnosis not present

## 2020-02-25 DIAGNOSIS — N1832 Chronic kidney disease, stage 3b: Secondary | ICD-10-CM | POA: Diagnosis not present

## 2020-02-25 DIAGNOSIS — G40909 Epilepsy, unspecified, not intractable, without status epilepticus: Secondary | ICD-10-CM | POA: Diagnosis not present

## 2020-02-25 DIAGNOSIS — S42201D Unspecified fracture of upper end of right humerus, subsequent encounter for fracture with routine healing: Secondary | ICD-10-CM | POA: Diagnosis not present

## 2020-02-25 DIAGNOSIS — I130 Hypertensive heart and chronic kidney disease with heart failure and stage 1 through stage 4 chronic kidney disease, or unspecified chronic kidney disease: Secondary | ICD-10-CM | POA: Diagnosis not present

## 2020-02-26 DIAGNOSIS — I130 Hypertensive heart and chronic kidney disease with heart failure and stage 1 through stage 4 chronic kidney disease, or unspecified chronic kidney disease: Secondary | ICD-10-CM | POA: Diagnosis not present

## 2020-02-26 DIAGNOSIS — I5042 Chronic combined systolic (congestive) and diastolic (congestive) heart failure: Secondary | ICD-10-CM | POA: Diagnosis not present

## 2020-02-26 DIAGNOSIS — D631 Anemia in chronic kidney disease: Secondary | ICD-10-CM | POA: Diagnosis not present

## 2020-02-26 DIAGNOSIS — G309 Alzheimer's disease, unspecified: Secondary | ICD-10-CM | POA: Diagnosis not present

## 2020-02-26 DIAGNOSIS — N1832 Chronic kidney disease, stage 3b: Secondary | ICD-10-CM | POA: Diagnosis not present

## 2020-02-26 DIAGNOSIS — S72144D Nondisplaced intertrochanteric fracture of right femur, subsequent encounter for closed fracture with routine healing: Secondary | ICD-10-CM | POA: Diagnosis not present

## 2020-02-26 DIAGNOSIS — G40909 Epilepsy, unspecified, not intractable, without status epilepticus: Secondary | ICD-10-CM | POA: Diagnosis not present

## 2020-02-26 DIAGNOSIS — F0280 Dementia in other diseases classified elsewhere without behavioral disturbance: Secondary | ICD-10-CM | POA: Diagnosis not present

## 2020-02-26 DIAGNOSIS — S42201D Unspecified fracture of upper end of right humerus, subsequent encounter for fracture with routine healing: Secondary | ICD-10-CM | POA: Diagnosis not present

## 2020-02-27 DIAGNOSIS — I130 Hypertensive heart and chronic kidney disease with heart failure and stage 1 through stage 4 chronic kidney disease, or unspecified chronic kidney disease: Secondary | ICD-10-CM | POA: Diagnosis not present

## 2020-02-27 DIAGNOSIS — G309 Alzheimer's disease, unspecified: Secondary | ICD-10-CM | POA: Diagnosis not present

## 2020-02-27 DIAGNOSIS — S72144D Nondisplaced intertrochanteric fracture of right femur, subsequent encounter for closed fracture with routine healing: Secondary | ICD-10-CM | POA: Diagnosis not present

## 2020-02-27 DIAGNOSIS — I5042 Chronic combined systolic (congestive) and diastolic (congestive) heart failure: Secondary | ICD-10-CM | POA: Diagnosis not present

## 2020-02-27 DIAGNOSIS — F0280 Dementia in other diseases classified elsewhere without behavioral disturbance: Secondary | ICD-10-CM | POA: Diagnosis not present

## 2020-02-27 DIAGNOSIS — G40909 Epilepsy, unspecified, not intractable, without status epilepticus: Secondary | ICD-10-CM | POA: Diagnosis not present

## 2020-02-27 DIAGNOSIS — S42201D Unspecified fracture of upper end of right humerus, subsequent encounter for fracture with routine healing: Secondary | ICD-10-CM | POA: Diagnosis not present

## 2020-02-27 DIAGNOSIS — D631 Anemia in chronic kidney disease: Secondary | ICD-10-CM | POA: Diagnosis not present

## 2020-02-27 DIAGNOSIS — N1832 Chronic kidney disease, stage 3b: Secondary | ICD-10-CM | POA: Diagnosis not present

## 2020-02-28 DIAGNOSIS — N1832 Chronic kidney disease, stage 3b: Secondary | ICD-10-CM | POA: Diagnosis not present

## 2020-02-28 DIAGNOSIS — G4089 Other seizures: Secondary | ICD-10-CM | POA: Diagnosis not present

## 2020-02-28 DIAGNOSIS — I5042 Chronic combined systolic (congestive) and diastolic (congestive) heart failure: Secondary | ICD-10-CM | POA: Diagnosis not present

## 2020-02-28 DIAGNOSIS — I130 Hypertensive heart and chronic kidney disease with heart failure and stage 1 through stage 4 chronic kidney disease, or unspecified chronic kidney disease: Secondary | ICD-10-CM | POA: Diagnosis not present

## 2020-02-28 DIAGNOSIS — G40909 Epilepsy, unspecified, not intractable, without status epilepticus: Secondary | ICD-10-CM | POA: Diagnosis not present

## 2020-02-28 DIAGNOSIS — S72144D Nondisplaced intertrochanteric fracture of right femur, subsequent encounter for closed fracture with routine healing: Secondary | ICD-10-CM | POA: Diagnosis not present

## 2020-02-28 DIAGNOSIS — G309 Alzheimer's disease, unspecified: Secondary | ICD-10-CM | POA: Diagnosis not present

## 2020-02-28 DIAGNOSIS — D631 Anemia in chronic kidney disease: Secondary | ICD-10-CM | POA: Diagnosis not present

## 2020-02-28 DIAGNOSIS — R059 Cough, unspecified: Secondary | ICD-10-CM | POA: Diagnosis not present

## 2020-02-28 DIAGNOSIS — F0280 Dementia in other diseases classified elsewhere without behavioral disturbance: Secondary | ICD-10-CM | POA: Diagnosis not present

## 2020-02-28 DIAGNOSIS — S42201D Unspecified fracture of upper end of right humerus, subsequent encounter for fracture with routine healing: Secondary | ICD-10-CM | POA: Diagnosis not present

## 2020-03-02 NOTE — Telephone Encounter (Signed)
Family member here for pt.  vimpat not received at [facility].  I called and gave VO to [facility]A for 200mg  po bid 45month and one refill.  To be sent to pt address, and called prior to delivery.

## 2020-03-03 DIAGNOSIS — M25511 Pain in right shoulder: Secondary | ICD-10-CM | POA: Diagnosis not present

## 2020-03-03 DIAGNOSIS — M25551 Pain in right hip: Secondary | ICD-10-CM | POA: Diagnosis not present

## 2020-03-04 ENCOUNTER — Encounter: Payer: Self-pay | Admitting: Adult Health

## 2020-03-04 ENCOUNTER — Other Ambulatory Visit: Payer: Medicare HMO

## 2020-03-04 ENCOUNTER — Ambulatory Visit: Payer: Medicare HMO | Admitting: Adult Health

## 2020-03-04 ENCOUNTER — Other Ambulatory Visit: Payer: Self-pay | Admitting: *Deleted

## 2020-03-04 VITALS — BP 176/110 | HR 91 | Ht 60.0 in | Wt 84.0 lb

## 2020-03-04 DIAGNOSIS — I129 Hypertensive chronic kidney disease with stage 1 through stage 4 chronic kidney disease, or unspecified chronic kidney disease: Secondary | ICD-10-CM | POA: Diagnosis not present

## 2020-03-04 DIAGNOSIS — F17200 Nicotine dependence, unspecified, uncomplicated: Secondary | ICD-10-CM | POA: Diagnosis not present

## 2020-03-04 DIAGNOSIS — F0390 Unspecified dementia without behavioral disturbance: Secondary | ICD-10-CM | POA: Diagnosis not present

## 2020-03-04 DIAGNOSIS — R569 Unspecified convulsions: Secondary | ICD-10-CM | POA: Diagnosis not present

## 2020-03-04 DIAGNOSIS — M109 Gout, unspecified: Secondary | ICD-10-CM | POA: Diagnosis not present

## 2020-03-04 DIAGNOSIS — I69993 Ataxia following unspecified cerebrovascular disease: Secondary | ICD-10-CM | POA: Diagnosis not present

## 2020-03-04 DIAGNOSIS — D509 Iron deficiency anemia, unspecified: Secondary | ICD-10-CM | POA: Diagnosis not present

## 2020-03-04 MED ORDER — LEVETIRACETAM 1000 MG PO TABS
1000.0000 mg | ORAL_TABLET | Freq: Two times a day (BID) | ORAL | 0 refills | Status: DC
Start: 2020-03-04 — End: 2020-04-17

## 2020-03-04 MED ORDER — LEVETIRACETAM 1000 MG PO TABS: 1000.0000 mg | ORAL_TABLET | Freq: Two times a day (BID) | ORAL | 3 refills | Status: DC

## 2020-03-04 NOTE — Progress Notes (Signed)
PATIENT: [name] DOB: [birth year]  REASON FOR VISIT: follow up HISTORY FROM: patient  HISTORY OF PRESENT ILLNESS: Today 03/04/20:  [name] is a 80 year old female with a history of seizures.  She returns today for follow-up.  She had a seizure event on November 26 and was admitted to the hospital.  Her Keppra was increased to 1000 mg twice a day and then had increased to 200 mg twice a day.  To the family's knowledge she has not had any additional seizure events.  She reports that a week ago she was in [place] and call for her daughter.  According to the family members daughter states that she was shaking but she was talking to her.  Unclear if this was a true seizure event.  The patient reports that she is not missed any seizure medication.  However the family is confused about what medication she should be taking for her blood pressure.  Blood pressure is elevated today.  They report that they have reached out to her primary care for an appointment.  09/30/19: [name] is a 80 year old female with a history of seizures.  She returns today for follow-up.  She remains on Keppra and Vimpat.  She denies any seizure events.  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  Denies any changes in her mood or behavior.  She returns today for an evaluation.  HISTORY 09/27/18:  [name] is a 80 year old female with a history of seizures.  She returns today for follow-up.  She remains on Keppra 500 mg daily and Vimpat 100 mg twice a day.  She reports that she has been tolerating these medications well.  She does not have any seizure events.  Her blood pressure is elevated today.  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  She denies any new symptoms.  She returns today for evaluation.  REVIEW OF SYSTEMS: Out of a complete 14 system review of symptoms, the patient complains only of the following symptoms, and all other reviewed systems are negative.  See  HPI  ALLERGIES: No Known Allergies  HOME MEDICATIONS: Outpatient Medications Prior to Visit  Medication Sig Dispense Refill  . allopurinol (ZYLOPRIM) 100 MG tablet Take 50 mg by mouth daily.     Marland Kitchen amLODipine (NORVASC) 10 MG tablet Take 10 mg by mouth daily.    . calcium carbonate (OSCAL) 1500 (600 Ca) MG TABS tablet Take 1,500 mg by mouth every evening.     . carvedilol (COREG) 12.5 MG tablet Take 12.5 mg by mouth 2 (two) times daily with a meal.    . docusate sodium (COLACE) 100 MG capsule Take 1 capsule (100 mg total) by mouth 2 (two) times daily. 10 capsule 0  . donepezil (ARICEPT) 5 MG tablet Take 5 mg by mouth at bedtime.    . FEROSUL 325 (65 Fe) MG tablet Take 325 mg by mouth every morning.    . heparin 5000 UNIT/ML injection Inject 1 mL (5,000 Units total) into the skin every 12 (twelve) hours for 14 days. For DVT prophylaxis after surgery 28 mL 0  . HYDROcodone-acetaminophen (NORCO) 5-325 MG tablet Take 1 tablet by mouth every 6 (six) hours as needed for moderate pain. MAXIMUM TOTAL ACETAMINOPHEN DOSE IS 4000 MG PER DAY 12 tablet 0  . lacosamide (VIMPAT) 200 MG TABS tablet Take 1 tablet (200 mg total) by mouth 2 (two) times daily. 60 tablet 5  . levETIRAcetam (KEPPRA) 1000 MG tablet Take 1 tablet (1,000  mg total) by mouth 2 (two) times daily.    . Multiple Vitamin (MULTIVITAMIN WITH MINERALS) TABS tablet Take 1 tablet by mouth daily.    . Nutritional Supplements (,FEEDING SUPPLEMENT, PROSOURCE PLUS) liquid Take 30 mLs by mouth 2 (two) times daily between meals.    . polyethylene glycol (MIRALAX / GLYCOLAX) 17 g packet Take 17 g by mouth daily as needed for mild constipation. 14 each 0   No facility-administered medications prior to visit.    PAST MEDICAL HISTORY: Past Medical History:  Diagnosis Date  . Abdominal pain 02/24/2013  . Acute combined systolic and diastolic congestive heart failure (HCC) 02/24/2013  . Acute on chronic renal insufficiency 05/15/2015  . Acute renal  failure (HCC) 12/28/2012  . Acute renal failure superimposed on stage 3 chronic kidney disease (HCC)   . Anemia 05/20/2015  . 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 Basal ganglia infarction (HCC) 01/08/2013  . CAP (community acquired pneumonia)   . Chest pain 05/14/2015  . Chronic combined systolic and diastolic CHF (congestive heart failure) (HCC) 03/06/2014  . Congestive heart failure (CHF) (HCC)   . Constipation 02/24/2013  . Cough   . Diastolic dysfunction-grade 3 05/20/2015  . Dyspnea 02/24/2013  . Encephalopathy 12/28/2012  . Epilepsy (HCC)   . Generalized seizure (HCC) 12/28/2012  . H/O: CVA (cerebrovascular accident) 01/04/2013  . Hyperammonemia (HCC) 05/17/2015  . Hyperglycemia 02/24/2013  . Hyperkalemia 02/11/2013  . Hypertension   . 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 Hyponatremia 02/11/2013  . Influenza A 02/11/2013  . Liver enzyme elevation 02/11/2013  . Metabolic acidosis 12/28/2012  . Metabolic encephalopathy 05/16/2015  . NICM (nonischemic cardiomyopathy) -EF 25% 05/20/2015  . Pleuritic chest pain 02/24/2013  . PNA (pneumonia) 05/20/2015  . Pneumobilia 02/12/2013  . Pneumonia   . Seizure (HCC) 12/28/2012  . Seizures (HCC)   . Smoker   . Stroke (HCC)   . Troponin level elevated 05/14/2015  . Volume overload 02/24/2013  . Weight gain 02/24/2013    PAST SURGICAL HISTORY: Past Surgical History:  Procedure Laterality Date  . FEMUR IM NAIL Right 01/05/2020  . INTRAMEDULLARY (IM) NAIL INTERTROCHANTERIC Right 01/05/2020   Procedure: INTRAMEDULLARY (IM) NAIL INTERTROCHANTRIC;  Surgeon: [name], MD;  Location: [facility] OR;  Service: Orthopedics;  Laterality: Right;  . NO PAST SURGERIES      FAMILY HISTORY: Family History  Problem Relation Age of Onset  . Cancer Father   . Anemia Daughter     SOCIAL HISTORY: Social History   Socioeconomic History  . Marital status: Single    Spouse name: Not on file  . Number of children: 3  . Years of  education: 63  . Highest education level: Not on file  Occupational History    Comment: Retired  Tobacco Use  . Smoking status: Current Some Day Smoker    Packs/day: 0.25    Types: Cigarettes  . Smokeless tobacco: Never Used  . Tobacco comment: 1/2 caigarettes a day  Vaping Use  . Vaping Use: Never used  Substance and Sexual Activity  . Alcohol use: Yes    Alcohol/week: 0.0 standard drinks    Comment: occasional  . Drug use: No  . Sexual activity: Not Currently  Other Topics Concern  . Not on file  Social History Narrative   Patient lives at home alone.    Retired.   Education one year of college.   Left handed.   Caffeine one cup of coffee daily and  one cup of tea.   Social Determinants of Health   Financial Resource Strain: Not on file  Food Insecurity: Not on file  Transportation Needs: Not on file  Physical Activity: Not on file  Stress: Not on file  Social Connections: Not on file  Intimate Partner Violence: Not on file      PHYSICAL EXAM  Vitals:   03/04/20 1119  BP: (!) 176/110  Pulse: 91  Weight: 84 lb (38.1 kg)  Height: 5' (1.524 m)   Body mass index is 16.41 kg/m.  Generalized: Frail, in no acute distress   Neurological examination  Mentation: Alert oriented to time, place, history taking. Follows all commands speech and language fluent Cranial nerve II-XII: Pupils were equal round reactive to light. Extraocular movements were full, visual field were full on confrontational test. Uvula tongue midline. Head turning and shoulder shrug  were normal and symmetric. Motor: The motor testing reveals 5 over 5 strength of all 4 extremities. Good symmetric motor tone is noted throughout.  Sensory: Sensory testing is intact to soft touch on all 4 extremities. No evidence of extinction is noted.  Coordination: Cerebellar testing reveals good finger-nose-finger and heel-to-shin bilaterally.  Gait and station: Gait is normal. Reflexes: Deep tendon reflexes are  symmetric and normal bilaterally.   DIAGNOSTIC DATA (LABS, IMAGING, TESTING) - I reviewed patient records, labs, notes, testing and imaging myself where available.  Lab Results  Component Value Date   WBC 9.9 01/09/2020   HGB 9.5 (L) 01/09/2020   HCT 28.9 (L) 01/09/2020   MCV 90.9 01/09/2020   PLT 194 01/09/2020      Component Value Date/Time   NA 135 01/09/2020 0224   NA 135 (A) 05/23/2015 0000   K 4.9 01/09/2020 0224   CL 103 01/09/2020 0224   CO2 24 01/09/2020 0224   GLUCOSE 112 (H) 01/09/2020 0224   BUN 40 (H) 01/09/2020 0224   BUN 40 (A) 05/23/2015 0000   CREATININE 1.65 (H) 01/09/2020 0224   CREATININE 1.42 (H) 06/04/2015 1515   CALCIUM 9.0 01/09/2020 0224   PROT 8.3 (H) 01/04/2020 1754   ALBUMIN 4.6 01/04/2020 1754   AST 28 01/04/2020 1754   ALT 27 01/04/2020 1754   ALKPHOS 57 01/04/2020 1754   BILITOT 0.7 01/04/2020 1754   GFRNONAA 32 (L) 01/09/2020 0224   GFRAA 38 (L) 07/23/2018 1657   Lab Results  Component Value Date   CHOL 135 01/07/2020   HDL 41 01/07/2020   LDLCALC 45 01/07/2020   TRIG 247 (H) 01/07/2020   CHOLHDL 3.3 01/07/2020   Lab Results  Component Value Date   HGBA1C 5.4 12/28/2019   Lab Results  Component Value Date   VITAMINB12 516 05/16/2015   Lab Results  Component Value Date   TSH 1.765 05/16/2015      ASSESSMENT AND PLAN 80 y.o. year old female  has a past medical history of Abdominal pain (02/24/2013), Acute combined systolic and diastolic congestive heart failure (HCC) (02/24/2013), Acute on chronic renal insufficiency (05/15/2015), Acute renal failure (HCC) (12/28/2012), Acute renal failure superimposed on stage 3 chronic kidney disease (HCC), Anemia (05/20/2015), Ataxia, late effect of cerebrovascular disease (02/19/2013), Basal ganglia infarction (HCC) (01/08/2013), CAP (community acquired pneumonia), Chest pain (05/14/2015), Chronic combined systolic and diastolic CHF (congestive heart failure) (HCC) (03/06/2014), Congestive heart  failure (CHF) (HCC), Constipation (02/24/2013), Cough, Diastolic dysfunction-grade 3 (09/26/784), Dyspnea (02/24/2013), Encephalopathy (12/28/2012), Epilepsy (HCC), Generalized seizure (HCC) (12/28/2012), H/O: CVA (cerebrovascular accident) (01/04/2013), Hyperammonemia (HCC) (05/17/2015), Hyperglycemia (02/24/2013), Hyperkalemia (02/11/2013),  Hypertension, Hypertensive heart and kidney disease (12/28/2012), Hyponatremia (02/11/2013), Influenza A (02/11/2013), Liver enzyme elevation (02/11/2013), Metabolic acidosis (12/28/2012), Metabolic encephalopathy (05/16/2015), NICM (nonischemic cardiomyopathy) -EF 25% (05/20/2015), Pleuritic chest pain (02/24/2013), PNA (pneumonia) (05/20/2015), Pneumobilia (02/12/2013), Pneumonia, Seizure (HCC) (12/28/2012), Seizures (HCC), Smoker, Stroke (HCC), Troponin level elevated (05/14/2015), Volume overload (02/24/2013), and Weight gain (02/24/2013). here with:  Seizures   Continue Vimpat 200 mg twice a day and Keppra 1000 mg twice a day  Advised if she has any seizure event she should let us know  Hypertension  Advised that we would reach out to her PCP and make him aware of her blood pressure today and asked that they reach out to her to review what medication she should be taking for her blood pressure.  Follow-up in 1 year or sooner if needed   I spent 30 minutes of face-to-face and non-face-to-face time with patient.  This included previsit chart review, lab review, study review, order entry, electronic health record documentation, patient education.  Butch Penny, MSN, NP-C 03/04/2020, 10:50 AM [facility] [address], [address] [place], [place] [address] [phone]  I reviewed the above note and documentation by the Nurse Practitioner and agree with the history, exam, assessment and plan as outlined above. I was available for consultation. Huston Foley, MD, PhD [facility])

## 2020-03-04 NOTE — Progress Notes (Signed)
Called spoke to [place], at [facility].  Was able to make appt for pt today at 1400 with Dr. Aliene Beams re: Bp. They asked about getting levetiracetam at local pharm for 14 days while waiting to mail order. [facility]

## 2020-03-04 NOTE — Patient Instructions (Signed)
Your Plan:  Continue Keppra 1000 mg twice a day Continue Vimpat 200 mg twice a day Our staff is reaching out to your PCP regarding your BP If your symptoms worsen or you develop new symptoms please let us know.       Thank you for coming to see [place] at [facility]. I hope we have been able to provide you high quality care today.  You may receive a patient satisfaction survey over the next few weeks. We would appreciate your feedback and comments so that we may continue to improve ourselves and the health of our patients.

## 2020-03-05 DIAGNOSIS — D631 Anemia in chronic kidney disease: Secondary | ICD-10-CM | POA: Diagnosis not present

## 2020-03-05 DIAGNOSIS — F0280 Dementia in other diseases classified elsewhere without behavioral disturbance: Secondary | ICD-10-CM | POA: Diagnosis not present

## 2020-03-05 DIAGNOSIS — G309 Alzheimer's disease, unspecified: Secondary | ICD-10-CM | POA: Diagnosis not present

## 2020-03-05 DIAGNOSIS — I130 Hypertensive heart and chronic kidney disease with heart failure and stage 1 through stage 4 chronic kidney disease, or unspecified chronic kidney disease: Secondary | ICD-10-CM | POA: Diagnosis not present

## 2020-03-05 DIAGNOSIS — S72144D Nondisplaced intertrochanteric fracture of right femur, subsequent encounter for closed fracture with routine healing: Secondary | ICD-10-CM | POA: Diagnosis not present

## 2020-03-05 DIAGNOSIS — I5042 Chronic combined systolic (congestive) and diastolic (congestive) heart failure: Secondary | ICD-10-CM | POA: Diagnosis not present

## 2020-03-05 DIAGNOSIS — N1832 Chronic kidney disease, stage 3b: Secondary | ICD-10-CM | POA: Diagnosis not present

## 2020-03-05 DIAGNOSIS — S42201D Unspecified fracture of upper end of right humerus, subsequent encounter for fracture with routine healing: Secondary | ICD-10-CM | POA: Diagnosis not present

## 2020-03-05 DIAGNOSIS — G40909 Epilepsy, unspecified, not intractable, without status epilepticus: Secondary | ICD-10-CM | POA: Diagnosis not present

## 2020-03-06 DIAGNOSIS — D631 Anemia in chronic kidney disease: Secondary | ICD-10-CM | POA: Diagnosis not present

## 2020-03-06 DIAGNOSIS — F0280 Dementia in other diseases classified elsewhere without behavioral disturbance: Secondary | ICD-10-CM | POA: Diagnosis not present

## 2020-03-06 DIAGNOSIS — G309 Alzheimer's disease, unspecified: Secondary | ICD-10-CM | POA: Diagnosis not present

## 2020-03-06 DIAGNOSIS — N1832 Chronic kidney disease, stage 3b: Secondary | ICD-10-CM | POA: Diagnosis not present

## 2020-03-06 DIAGNOSIS — G40909 Epilepsy, unspecified, not intractable, without status epilepticus: Secondary | ICD-10-CM | POA: Diagnosis not present

## 2020-03-06 DIAGNOSIS — S72144D Nondisplaced intertrochanteric fracture of right femur, subsequent encounter for closed fracture with routine healing: Secondary | ICD-10-CM | POA: Diagnosis not present

## 2020-03-06 DIAGNOSIS — S42201D Unspecified fracture of upper end of right humerus, subsequent encounter for fracture with routine healing: Secondary | ICD-10-CM | POA: Diagnosis not present

## 2020-03-06 DIAGNOSIS — I130 Hypertensive heart and chronic kidney disease with heart failure and stage 1 through stage 4 chronic kidney disease, or unspecified chronic kidney disease: Secondary | ICD-10-CM | POA: Diagnosis not present

## 2020-03-06 DIAGNOSIS — I5042 Chronic combined systolic (congestive) and diastolic (congestive) heart failure: Secondary | ICD-10-CM | POA: Diagnosis not present

## 2020-03-09 ENCOUNTER — Other Ambulatory Visit: Payer: Self-pay | Admitting: *Deleted

## 2020-03-09 ENCOUNTER — Telehealth: Payer: Self-pay | Admitting: Adult Health

## 2020-03-09 DIAGNOSIS — M25511 Pain in right shoulder: Secondary | ICD-10-CM | POA: Diagnosis not present

## 2020-03-09 DIAGNOSIS — D509 Iron deficiency anemia, unspecified: Secondary | ICD-10-CM | POA: Diagnosis not present

## 2020-03-09 DIAGNOSIS — I129 Hypertensive chronic kidney disease with stage 1 through stage 4 chronic kidney disease, or unspecified chronic kidney disease: Secondary | ICD-10-CM | POA: Diagnosis not present

## 2020-03-09 MED ORDER — LACOSAMIDE 200 MG PO TABS: 200.0000 mg | ORAL_TABLET | Freq: Two times a day (BID) | ORAL | 0 refills | Status: DC

## 2020-03-09 NOTE — Telephone Encounter (Signed)
Pt's friend/caregiver came in today needing a prescription of (vimpat) to be sent to her daughters house ASAP since she is staying there the address is [address] [address]. She also wanted to know if she needed to continue to use the same pharmacy that she has been for her Levetiracetam. Best call back is [phone]

## 2020-03-09 NOTE — Telephone Encounter (Signed)
[name], caregiver picked up 14 days worth of samples.

## 2020-03-09 NOTE — Telephone Encounter (Signed)
I called spoke to caregiver.  She will come by and pick up samples of vimpat for pt.  She get from [facility], has one refill left. Per January phone note.  I gave her the phone # to call [phone] for vimpat ([facility] PAP).  [facility] for [facility] and recovery stay with daughter.  She verbalized understanding.

## 2020-03-09 NOTE — Telephone Encounter (Signed)
The address stated before was incorrect. It is [address] [place], [address].

## 2020-03-12 DIAGNOSIS — I130 Hypertensive heart and chronic kidney disease with heart failure and stage 1 through stage 4 chronic kidney disease, or unspecified chronic kidney disease: Secondary | ICD-10-CM | POA: Diagnosis not present

## 2020-03-12 DIAGNOSIS — G40909 Epilepsy, unspecified, not intractable, without status epilepticus: Secondary | ICD-10-CM | POA: Diagnosis not present

## 2020-03-12 DIAGNOSIS — S42201D Unspecified fracture of upper end of right humerus, subsequent encounter for fracture with routine healing: Secondary | ICD-10-CM | POA: Diagnosis not present

## 2020-03-12 DIAGNOSIS — N1832 Chronic kidney disease, stage 3b: Secondary | ICD-10-CM | POA: Diagnosis not present

## 2020-03-12 DIAGNOSIS — D631 Anemia in chronic kidney disease: Secondary | ICD-10-CM | POA: Diagnosis not present

## 2020-03-12 DIAGNOSIS — F0280 Dementia in other diseases classified elsewhere without behavioral disturbance: Secondary | ICD-10-CM | POA: Diagnosis not present

## 2020-03-12 DIAGNOSIS — I5042 Chronic combined systolic (congestive) and diastolic (congestive) heart failure: Secondary | ICD-10-CM | POA: Diagnosis not present

## 2020-03-12 DIAGNOSIS — S72144D Nondisplaced intertrochanteric fracture of right femur, subsequent encounter for closed fracture with routine healing: Secondary | ICD-10-CM | POA: Diagnosis not present

## 2020-03-12 DIAGNOSIS — G309 Alzheimer's disease, unspecified: Secondary | ICD-10-CM | POA: Diagnosis not present

## 2020-03-16 DIAGNOSIS — S72144D Nondisplaced intertrochanteric fracture of right femur, subsequent encounter for closed fracture with routine healing: Secondary | ICD-10-CM | POA: Diagnosis not present

## 2020-03-16 DIAGNOSIS — I130 Hypertensive heart and chronic kidney disease with heart failure and stage 1 through stage 4 chronic kidney disease, or unspecified chronic kidney disease: Secondary | ICD-10-CM | POA: Diagnosis not present

## 2020-03-16 DIAGNOSIS — G309 Alzheimer's disease, unspecified: Secondary | ICD-10-CM | POA: Diagnosis not present

## 2020-03-16 DIAGNOSIS — S42201D Unspecified fracture of upper end of right humerus, subsequent encounter for fracture with routine healing: Secondary | ICD-10-CM | POA: Diagnosis not present

## 2020-03-16 DIAGNOSIS — G40909 Epilepsy, unspecified, not intractable, without status epilepticus: Secondary | ICD-10-CM | POA: Diagnosis not present

## 2020-03-16 DIAGNOSIS — I5042 Chronic combined systolic (congestive) and diastolic (congestive) heart failure: Secondary | ICD-10-CM | POA: Diagnosis not present

## 2020-03-16 DIAGNOSIS — D631 Anemia in chronic kidney disease: Secondary | ICD-10-CM | POA: Diagnosis not present

## 2020-03-16 DIAGNOSIS — N1832 Chronic kidney disease, stage 3b: Secondary | ICD-10-CM | POA: Diagnosis not present

## 2020-03-16 DIAGNOSIS — F0280 Dementia in other diseases classified elsewhere without behavioral disturbance: Secondary | ICD-10-CM | POA: Diagnosis not present

## 2020-03-17 DIAGNOSIS — I5042 Chronic combined systolic (congestive) and diastolic (congestive) heart failure: Secondary | ICD-10-CM | POA: Diagnosis not present

## 2020-03-17 DIAGNOSIS — I130 Hypertensive heart and chronic kidney disease with heart failure and stage 1 through stage 4 chronic kidney disease, or unspecified chronic kidney disease: Secondary | ICD-10-CM | POA: Diagnosis not present

## 2020-03-17 DIAGNOSIS — D631 Anemia in chronic kidney disease: Secondary | ICD-10-CM | POA: Diagnosis not present

## 2020-03-17 DIAGNOSIS — S42201D Unspecified fracture of upper end of right humerus, subsequent encounter for fracture with routine healing: Secondary | ICD-10-CM | POA: Diagnosis not present

## 2020-03-17 DIAGNOSIS — N1832 Chronic kidney disease, stage 3b: Secondary | ICD-10-CM | POA: Diagnosis not present

## 2020-03-17 DIAGNOSIS — F0280 Dementia in other diseases classified elsewhere without behavioral disturbance: Secondary | ICD-10-CM | POA: Diagnosis not present

## 2020-03-17 DIAGNOSIS — G309 Alzheimer's disease, unspecified: Secondary | ICD-10-CM | POA: Diagnosis not present

## 2020-03-17 DIAGNOSIS — S72144D Nondisplaced intertrochanteric fracture of right femur, subsequent encounter for closed fracture with routine healing: Secondary | ICD-10-CM | POA: Diagnosis not present

## 2020-03-17 DIAGNOSIS — G40909 Epilepsy, unspecified, not intractable, without status epilepticus: Secondary | ICD-10-CM | POA: Diagnosis not present

## 2020-03-18 DIAGNOSIS — I5042 Chronic combined systolic (congestive) and diastolic (congestive) heart failure: Secondary | ICD-10-CM | POA: Diagnosis not present

## 2020-03-18 DIAGNOSIS — D631 Anemia in chronic kidney disease: Secondary | ICD-10-CM | POA: Diagnosis not present

## 2020-03-18 DIAGNOSIS — F0280 Dementia in other diseases classified elsewhere without behavioral disturbance: Secondary | ICD-10-CM | POA: Diagnosis not present

## 2020-03-18 DIAGNOSIS — S42201D Unspecified fracture of upper end of right humerus, subsequent encounter for fracture with routine healing: Secondary | ICD-10-CM | POA: Diagnosis not present

## 2020-03-18 DIAGNOSIS — I130 Hypertensive heart and chronic kidney disease with heart failure and stage 1 through stage 4 chronic kidney disease, or unspecified chronic kidney disease: Secondary | ICD-10-CM | POA: Diagnosis not present

## 2020-03-18 DIAGNOSIS — S72144D Nondisplaced intertrochanteric fracture of right femur, subsequent encounter for closed fracture with routine healing: Secondary | ICD-10-CM | POA: Diagnosis not present

## 2020-03-18 DIAGNOSIS — G40909 Epilepsy, unspecified, not intractable, without status epilepticus: Secondary | ICD-10-CM | POA: Diagnosis not present

## 2020-03-18 DIAGNOSIS — N1832 Chronic kidney disease, stage 3b: Secondary | ICD-10-CM | POA: Diagnosis not present

## 2020-03-18 DIAGNOSIS — G309 Alzheimer's disease, unspecified: Secondary | ICD-10-CM | POA: Diagnosis not present

## 2020-03-21 ENCOUNTER — Emergency Department (HOSPITAL_COMMUNITY)
Admission: EM | Admit: 2020-03-21 | Discharge: 2020-03-22 | Disposition: A | Discharge status: Home / Self Care | Payer: Medicare HMO | Attending: Emergency Medicine | Admitting: Emergency Medicine

## 2020-03-21 DIAGNOSIS — I10 Essential (primary) hypertension: Secondary | ICD-10-CM | POA: Diagnosis not present

## 2020-03-21 DIAGNOSIS — Z5321 Procedure and treatment not carried out due to patient leaving prior to being seen by health care provider: Secondary | ICD-10-CM | POA: Insufficient documentation

## 2020-03-21 DIAGNOSIS — R29810 Facial weakness: Secondary | ICD-10-CM | POA: Diagnosis not present

## 2020-03-21 DIAGNOSIS — R1110 Vomiting, unspecified: Secondary | ICD-10-CM | POA: Diagnosis not present

## 2020-03-21 DIAGNOSIS — R1111 Vomiting without nausea: Secondary | ICD-10-CM | POA: Diagnosis not present

## 2020-03-21 DIAGNOSIS — R531 Weakness: Secondary | ICD-10-CM | POA: Diagnosis not present

## 2020-03-21 DIAGNOSIS — Z8673 Personal history of transient ischemic attack (TIA), and cerebral infarction without residual deficits: Secondary | ICD-10-CM | POA: Diagnosis not present

## 2020-03-21 DIAGNOSIS — G459 Transient cerebral ischemic attack, unspecified: Secondary | ICD-10-CM | POA: Diagnosis not present

## 2020-03-21 DIAGNOSIS — G319 Degenerative disease of nervous system, unspecified: Secondary | ICD-10-CM | POA: Diagnosis not present

## 2020-03-21 DIAGNOSIS — R262 Difficulty in walking, not elsewhere classified: Secondary | ICD-10-CM | POA: Diagnosis not present

## 2020-03-21 DIAGNOSIS — G9389 Other specified disorders of brain: Secondary | ICD-10-CM | POA: Diagnosis not present

## 2020-03-21 NOTE — ED Triage Notes (Signed)
Pt is from home via [facility]. Family checked on pt and reported the pt was conscious but not alert, had episode of vomiting. Can normally stand up, but could not bear weight. Right sided facial droop. Hx of stroke and seizure. Symptoms lasted 15 minutes, at baseline on ems arrival to scene. Speech normal, uses walker at baseline. C/a/o. Storke scale negative, LVO 0. CBG 160, EKG (elevation and BBB, transmitted to EDP) 154/88, hr 70. SPO2 100%.

## 2020-03-22 ENCOUNTER — Emergency Department (HOSPITAL_COMMUNITY): Payer: Medicare HMO

## 2020-03-22 ENCOUNTER — Other Ambulatory Visit: Payer: Self-pay

## 2020-03-22 DIAGNOSIS — G9389 Other specified disorders of brain: Secondary | ICD-10-CM | POA: Diagnosis not present

## 2020-03-22 DIAGNOSIS — Z8673 Personal history of transient ischemic attack (TIA), and cerebral infarction without residual deficits: Secondary | ICD-10-CM | POA: Diagnosis not present

## 2020-03-22 DIAGNOSIS — G319 Degenerative disease of nervous system, unspecified: Secondary | ICD-10-CM | POA: Diagnosis not present

## 2020-03-22 LAB — COMPREHENSIVE METABOLIC PANEL
ALT: 19 U/L (ref 0–44)
AST: 23 U/L (ref 15–41)
Albumin: 3.9 g/dL (ref 3.5–5.0)
Alkaline Phosphatase: 70 U/L (ref 38–126)
Anion gap: 12 (ref 5–15)
BUN: 29 mg/dL — ABNORMAL HIGH (ref 8–23)
CO2: 21 mmol/L — ABNORMAL LOW (ref 22–32)
Calcium: 9.8 mg/dL (ref 8.9–10.3)
Chloride: 100 mmol/L (ref 98–111)
Creatinine, Ser: 1.52 mg/dL — ABNORMAL HIGH (ref 0.44–1.00)
GFR, Estimated: 35 mL/min — ABNORMAL LOW (ref >60)
Glucose, Bld: 130 mg/dL — ABNORMAL HIGH (ref 70–99)
Potassium: 4.7 mmol/L (ref 3.5–5.1)
Sodium: 133 mmol/L — ABNORMAL LOW (ref 135–145)
Total Bilirubin: 0.5 mg/dL (ref 0.3–1.2)
Total Protein: 7.8 g/dL (ref 6.5–8.1)

## 2020-03-22 LAB — DIFFERENTIAL
Abs Immature Granulocytes: 0.05 10*3/uL (ref 0.00–0.07)
Basophils Absolute: 0 10*3/uL (ref 0.0–0.1)
Basophils Relative: 0 %
Eosinophils Absolute: 0.1 10*3/uL (ref 0.0–0.5)
Eosinophils Relative: 1 %
Immature Granulocytes: 0 %
Lymphocytes Relative: 13 %
Lymphs Abs: 1.4 10*3/uL (ref 0.7–4.0)
Monocytes Absolute: 0.9 10*3/uL (ref 0.1–1.0)
Monocytes Relative: 8 %
Neutro Abs: 8.7 10*3/uL — ABNORMAL HIGH (ref 1.7–7.7)
Neutrophils Relative %: 78 %

## 2020-03-22 LAB — PROTIME-INR
INR: 1 (ref 0.8–1.2)
Prothrombin Time: 12.5 seconds (ref 11.4–15.2)

## 2020-03-22 LAB — CBC
HCT: 34.8 % — ABNORMAL LOW (ref 36.0–46.0)
Hemoglobin: 11.3 g/dL — ABNORMAL LOW (ref 12.0–15.0)
MCH: 31.8 pg (ref 26.0–34.0)
MCHC: 32.5 g/dL (ref 30.0–36.0)
MCV: 98 fL (ref 80.0–100.0)
Platelets: 318 10*3/uL (ref 150–400)
RBC: 3.55 MIL/uL — ABNORMAL LOW (ref 3.87–5.11)
RDW: 18.2 % — ABNORMAL HIGH (ref 11.5–15.5)
WBC: 11.2 10*3/uL — ABNORMAL HIGH (ref 4.0–10.5)
nRBC: 0 % (ref 0.0–0.2)

## 2020-03-22 LAB — I-STAT CHEM 8, ED
BUN: 30 mg/dL — ABNORMAL HIGH (ref 8–23)
Calcium, Ion: 1.2 mmol/L (ref 1.15–1.40)
Chloride: 100 mmol/L (ref 98–111)
Creatinine, Ser: 1.4 mg/dL — ABNORMAL HIGH (ref 0.44–1.00)
Glucose, Bld: 129 mg/dL — ABNORMAL HIGH (ref 70–99)
HCT: 35 % — ABNORMAL LOW (ref 36.0–46.0)
Hemoglobin: 11.9 g/dL — ABNORMAL LOW (ref 12.0–15.0)
Potassium: 4.7 mmol/L (ref 3.5–5.1)
Sodium: 135 mmol/L (ref 135–145)
TCO2: 26 mmol/L (ref 22–32)

## 2020-03-22 LAB — APTT: aPTT: 28 seconds (ref 24–36)

## 2020-03-22 MED ORDER — SODIUM CHLORIDE 0.9% FLUSH: 3.0000 mL | Freq: Once | INTRAVENOUS | Status: DC

## 2020-03-22 NOTE — ED Notes (Signed)
Pt reports that tonight her stomach was cramping, she was having trouble talking and could not move her left leg. Symptoms lasted about 3-4 minutes. She did feel somewhat better after vomiting. Now alert/oriented, no nausea. Mae x 4, clear speech. Hx of stroke, thinks she is on blood thinners. Is taking her seizure meds as prescribed.

## 2020-03-22 NOTE — ED Notes (Signed)
Patient son comes in stating "they told us its a 14 hour wait and im taking her home if her feet get worse ill take her to urgent care because she cant sit here like this." AMA

## 2020-03-25 ENCOUNTER — Encounter (HOSPITAL_COMMUNITY): Payer: Self-pay

## 2020-03-25 ENCOUNTER — Ambulatory Visit (HOSPITAL_COMMUNITY)
Admission: EM | Admit: 2020-03-25 | Discharge: 2020-03-25 | Disposition: A | Discharge status: Home / Self Care | Payer: Medicare HMO

## 2020-03-25 ENCOUNTER — Emergency Department (HOSPITAL_COMMUNITY): Payer: Medicare HMO

## 2020-03-25 ENCOUNTER — Emergency Department (HOSPITAL_COMMUNITY)
Admission: EM | Admit: 2020-03-25 | Discharge: 2020-03-25 | Disposition: A | Discharge status: Home / Self Care | Payer: Medicare HMO | Source: Home / Self Care | Attending: Emergency Medicine | Admitting: Emergency Medicine

## 2020-03-25 ENCOUNTER — Other Ambulatory Visit: Payer: Self-pay

## 2020-03-25 DIAGNOSIS — E875 Hyperkalemia: Secondary | ICD-10-CM | POA: Diagnosis not present

## 2020-03-25 DIAGNOSIS — G40909 Epilepsy, unspecified, not intractable, without status epilepticus: Secondary | ICD-10-CM | POA: Insufficient documentation

## 2020-03-25 DIAGNOSIS — I130 Hypertensive heart and chronic kidney disease with heart failure and stage 1 through stage 4 chronic kidney disease, or unspecified chronic kidney disease: Secondary | ICD-10-CM | POA: Insufficient documentation

## 2020-03-25 DIAGNOSIS — Z20822 Contact with and (suspected) exposure to covid-19: Secondary | ICD-10-CM | POA: Insufficient documentation

## 2020-03-25 DIAGNOSIS — S72002A Fracture of unspecified part of neck of left femur, initial encounter for closed fracture: Secondary | ICD-10-CM | POA: Diagnosis not present

## 2020-03-25 DIAGNOSIS — R4182 Altered mental status, unspecified: Secondary | ICD-10-CM | POA: Insufficient documentation

## 2020-03-25 DIAGNOSIS — I5041 Acute combined systolic (congestive) and diastolic (congestive) heart failure: Secondary | ICD-10-CM | POA: Insufficient documentation

## 2020-03-25 DIAGNOSIS — G309 Alzheimer's disease, unspecified: Secondary | ICD-10-CM | POA: Insufficient documentation

## 2020-03-25 DIAGNOSIS — N1832 Chronic kidney disease, stage 3b: Secondary | ICD-10-CM | POA: Insufficient documentation

## 2020-03-25 DIAGNOSIS — I517 Cardiomegaly: Secondary | ICD-10-CM | POA: Diagnosis not present

## 2020-03-25 DIAGNOSIS — F17210 Nicotine dependence, cigarettes, uncomplicated: Secondary | ICD-10-CM | POA: Insufficient documentation

## 2020-03-25 DIAGNOSIS — Z043 Encounter for examination and observation following other accident: Secondary | ICD-10-CM | POA: Diagnosis not present

## 2020-03-25 DIAGNOSIS — S72012A Unspecified intracapsular fracture of left femur, initial encounter for closed fracture: Secondary | ICD-10-CM | POA: Diagnosis not present

## 2020-03-25 DIAGNOSIS — R479 Unspecified speech disturbances: Secondary | ICD-10-CM | POA: Insufficient documentation

## 2020-03-25 DIAGNOSIS — Z79899 Other long term (current) drug therapy: Secondary | ICD-10-CM | POA: Insufficient documentation

## 2020-03-25 LAB — CBC WITH DIFFERENTIAL/PLATELET
Abs Immature Granulocytes: 0.02 10*3/uL (ref 0.00–0.07)
Basophils Absolute: 0 10*3/uL (ref 0.0–0.1)
Basophils Relative: 0 %
Eosinophils Absolute: 0.1 10*3/uL (ref 0.0–0.5)
Eosinophils Relative: 1 %
HCT: 32.5 % — ABNORMAL LOW (ref 36.0–46.0)
Hemoglobin: 10.7 g/dL — ABNORMAL LOW (ref 12.0–15.0)
Immature Granulocytes: 0 %
Lymphocytes Relative: 18 %
Lymphs Abs: 1.3 10*3/uL (ref 0.7–4.0)
MCH: 32.7 pg (ref 26.0–34.0)
MCHC: 32.9 g/dL (ref 30.0–36.0)
MCV: 99.4 fL (ref 80.0–100.0)
Monocytes Absolute: 0.7 10*3/uL (ref 0.1–1.0)
Monocytes Relative: 9 %
Neutro Abs: 5.1 10*3/uL (ref 1.7–7.7)
Neutrophils Relative %: 72 %
Platelets: 309 10*3/uL (ref 150–400)
RBC: 3.27 MIL/uL — ABNORMAL LOW (ref 3.87–5.11)
RDW: 18.2 % — ABNORMAL HIGH (ref 11.5–15.5)
WBC: 7.2 10*3/uL (ref 4.0–10.5)
nRBC: 0 % (ref 0.0–0.2)

## 2020-03-25 LAB — COMPREHENSIVE METABOLIC PANEL
ALT: 19 U/L (ref 0–44)
AST: 27 U/L (ref 15–41)
Albumin: 4.3 g/dL (ref 3.5–5.0)
Alkaline Phosphatase: 78 U/L (ref 38–126)
Anion gap: 12 (ref 5–15)
BUN: 29 mg/dL — ABNORMAL HIGH (ref 8–23)
CO2: 21 mmol/L — ABNORMAL LOW (ref 22–32)
Calcium: 9.9 mg/dL (ref 8.9–10.3)
Chloride: 106 mmol/L (ref 98–111)
Creatinine, Ser: 1.42 mg/dL — ABNORMAL HIGH (ref 0.44–1.00)
GFR, Estimated: 38 mL/min — ABNORMAL LOW (ref >60)
Glucose, Bld: 107 mg/dL — ABNORMAL HIGH (ref 70–99)
Potassium: 5.3 mmol/L — ABNORMAL HIGH (ref 3.5–5.1)
Sodium: 139 mmol/L (ref 135–145)
Total Bilirubin: 0.8 mg/dL (ref 0.3–1.2)
Total Protein: 9 g/dL — ABNORMAL HIGH (ref 6.5–8.1)

## 2020-03-25 LAB — PROTIME-INR
INR: 0.9 (ref 0.8–1.2)
Prothrombin Time: 12.1 seconds (ref 11.4–15.2)

## 2020-03-25 LAB — I-STAT CHEM 8, ED
BUN: 42 mg/dL — ABNORMAL HIGH (ref 8–23)
Calcium, Ion: 1.21 mmol/L (ref 1.15–1.40)
Chloride: 109 mmol/L (ref 98–111)
Creatinine, Ser: 1.4 mg/dL — ABNORMAL HIGH (ref 0.44–1.00)
Glucose, Bld: 102 mg/dL — ABNORMAL HIGH (ref 70–99)
HCT: 38 % (ref 36.0–46.0)
Hemoglobin: 12.9 g/dL (ref 12.0–15.0)
Potassium: 6.5 mmol/L (ref 3.5–5.1)
Sodium: 138 mmol/L (ref 135–145)
TCO2: 26 mmol/L (ref 22–32)

## 2020-03-25 LAB — APTT: aPTT: 26 seconds (ref 24–36)

## 2020-03-25 LAB — RESP PANEL BY RT-PCR (FLU A&B, COVID) ARPGX2
Influenza A by PCR: NEGATIVE
Influenza B by PCR: NEGATIVE
SARS Coronavirus 2 by RT PCR: NEGATIVE

## 2020-03-25 LAB — ETHANOL: Alcohol, Ethyl (B): <10 mg/dL (ref <10)

## 2020-03-25 LAB — CBG MONITORING, ED: Glucose-Capillary: 95 mg/dL (ref 70–99)

## 2020-03-25 MED ORDER — SODIUM ZIRCONIUM CYCLOSILICATE 5 G PO PACK
5.0000 g | PACK | Freq: Once | ORAL | Status: AC
  Administered 2020-03-25: 5 g via ORAL
  Filled 2020-03-25: qty 1

## 2020-03-25 MED ORDER — LORAZEPAM 2 MG/ML IJ SOLN: 0.5000 mg | Freq: Once | INTRAMUSCULAR | Status: DC

## 2020-03-25 NOTE — ED Notes (Signed)
Patient is in wheelchair, slow speech.  Patient has multiple family members .  They are concerned she is going to have a seizure.  History of the same.  A son reports she is different today, unclear if woke this way or changed-but am being told this is not new-says patient behaves this way.  Patient has multiple health risks, and unclear historians.  Spoke to [employer], pa about patient

## 2020-03-25 NOTE — ED Notes (Signed)
IV team consult placed. Need lav and blue top redraw with blood work

## 2020-03-25 NOTE — ED Notes (Signed)
Patient in CT scan. IV team said they will come back

## 2020-03-25 NOTE — Discharge Instructions (Addendum)
Continue taking home medications as prescribed.  Call the neurology office to set up a follow up appointment,  Return to the ER if she has any new, worsening, or concerning symptoms.   Your potassium was slightly high today.  Get this rechecked with your primary care doctor in 1 week.

## 2020-03-25 NOTE — ED Notes (Signed)
Patient is being discharged from the Urgent Care and sent to the Emergency Department via POV . Per Clent Jacks, PA, patient is in need of higher level of care due to patient / family complaints. Patient is aware and verbalizes understanding of plan of care. There were no vitals filed for this visit.

## 2020-03-25 NOTE — ED Notes (Signed)
Sophia, PA made aware that pt is in the room and she is going to assess pt at this time.

## 2020-03-25 NOTE — ED Triage Notes (Signed)
Pt presents with c/o altered mental status. Family reports that they believe she might have had a stroke Saturday/Sunday morning. Family reports that approx one hour ago she began to act altered, talking "jibberish" and that her words were slurred. Pt is slumped down in the wheelchair and will groan when this RN asks questions.

## 2020-03-25 NOTE — ED Notes (Signed)
IV attempt x 3.

## 2020-03-25 NOTE — ED Provider Notes (Signed)
[facility]-EMERGENCY DEPT Provider Note   CSN: [id number] Arrival date & time: 03/25/20  1208     History Chief Complaint  Patient presents with  . Altered Mental Status    [name] is a 80 y.o. female presenting for evaluation of altered mental status.  Level 5 caveat due to AMS.  Son states patient has had speech abnormality for the past 1 to 2 hours.  She has had gradual functional decline for the past 2 days, having increased difficulty walking and not being as sharp.  She has a history of waxing and waning functionality, however speech issues or not only is present.  She normally is able to hold a conversation.  Per son, patient had a stroke in the fall 2021, presented similarly.  She often stays with his sister in [place], but currently is living with him in [place].  She has been receiving all her medications as prescribed, including her Vimpat and Keppra.  No fall or injury.  No recent fevers, chills, cough, complains of nausea, abdominal pain, urinary symptoms, normal bowel movements.  Son states a few days ago patient had an episode of emesis, went to the ER but left without being seen due to wait time.  He was not present at that time.  Additional history obtained from chart review.  Per chart review, patient did not have a stroke in fall 2021, instead was eventually found to have seizures.  She does have extensive brain matter disease, advanced even for her age. Additional medical history of CHF, CKD, baseline ataxia, seizures, hypertension, Alzheimer's  HPI     Past Medical History:  Diagnosis Date  . Abdominal pain 02/24/2013  . Acute combined systolic and diastolic congestive heart failure (HCC) 02/24/2013  . Acute on chronic renal insufficiency 05/15/2015  . Acute renal failure (HCC) 12/28/2012  . Acute renal failure superimposed on stage 3 chronic kidney disease (HCC)   . Anemia 05/20/2015  . 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 Basal ganglia infarction (HCC) 01/08/2013  . CAP (community acquired pneumonia)   . Chest pain 05/14/2015  . Chronic combined systolic and diastolic CHF (congestive heart failure) (HCC) 03/06/2014  . Congestive heart failure (CHF) (HCC)   . Constipation 02/24/2013  . Cough   . Diastolic dysfunction-grade 3 05/20/2015  . Dyspnea 02/24/2013  . Encephalopathy 12/28/2012  . Epilepsy (HCC)   . Generalized seizure (HCC) 12/28/2012  . H/O: CVA (cerebrovascular accident) 01/04/2013  . Hyperammonemia (HCC) 05/17/2015  . Hyperglycemia 02/24/2013  . Hyperkalemia 02/11/2013  . Hypertension   . 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 Hyponatremia 02/11/2013  . Influenza A 02/11/2013  . Liver enzyme elevation 02/11/2013  . Metabolic acidosis 12/28/2012  . Metabolic encephalopathy 05/16/2015  . NICM (nonischemic cardiomyopathy) -EF 25% 05/20/2015  . Pleuritic chest pain 02/24/2013  . PNA (pneumonia) 05/20/2015  . Pneumobilia 02/12/2013  . Pneumonia   . Seizure (HCC) 12/28/2012  . Seizures (HCC)   . Smoker   . Stroke (HCC)   . Troponin level elevated 05/14/2015  . Volume overload 02/24/2013  . Weight gain 02/24/2013    Patient Active Problem List   Diagnosis Date Noted  . Intertrochanteric fracture of right femur (HCC) 01/04/2020  . Humeral head fracture 01/04/2020  . Humeral surgical neck fracture 01/04/2020  . Witnessed seizure-like activity (HCC) 12/27/2019  . Chronic kidney disease, stage 3b (HCC) 12/27/2019  . Alzheimer's dementia without behavioral disturbance (HCC) 12/27/2019  . Gout 12/27/2019  .  Leukocytosis 12/27/2019  . Cough   . Encounter for palliative care   . Goals of care, counseling/discussion   . Pneumonia   . NICM (nonischemic cardiomyopathy) -EF 25% 05/20/2015  . No significant CAD at cath May 2012 05/20/2015  . Anemia 05/20/2015  . Diastolic dysfunction-grade 3 05/20/2015  . Acute renal failure superimposed on stage 3 chronic  kidney disease (HCC)   . CAP (community acquired pneumonia)   . Hyperammonemia (HCC) 05/17/2015  . Metabolic encephalopathy 05/16/2015  . Acute on chronic renal insufficiency 05/15/2015  . Chest pain 05/14/2015  . Troponin level elevated 05/14/2015  . Chronic combined systolic and diastolic congestive heart failure (HCC) 03/06/2014  . Acute combined systolic and diastolic congestive heart failure (HCC) 02/24/2013  . Weight gain 02/24/2013  . Volume overload 02/24/2013  . Hyperglycemia 02/24/2013  . Dyspnea 02/24/2013  . Constipation 02/24/2013  . Abdominal pain 02/24/2013  . Pleuritic chest pain 02/24/2013  . Epilepsy (HCC)   . Smoker   . 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 Protein-calorie malnutrition, severe- wgt 86 lbs 02/13/2013  . Pneumobilia 02/12/2013  . Influenza A 02/11/2013  . Hyperkalemia 02/11/2013  . Hyponatremia 02/11/2013  . HCAP (healthcare-associated pneumonia) 02/11/2013  . Liver enzyme elevation 02/11/2013  . Basal ganglia infarction (HCC) 01/08/2013  . H/O: CVA (cerebrovascular accident) 01/04/2013  . Generalized seizure (HCC) 12/28/2012  . 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 Seizure disorder (HCC) 12/28/2012  . Metabolic acidosis 12/28/2012  . Encephalopathy 12/28/2012  . Acute renal failure (HCC) 12/28/2012    Past Surgical History:  Procedure Laterality Date  . FEMUR IM NAIL Right 01/05/2020  . INTRAMEDULLARY (IM) NAIL INTERTROCHANTERIC Right 01/05/2020   Procedure: INTRAMEDULLARY (IM) NAIL INTERTROCHANTRIC;  Surgeon: Bjorn PippinVarkey, Dax T, MD;  Location: [facility] OR;  Service: Orthopedics;  Laterality: Right;  . NO PAST SURGERIES       OB History   No obstetric history on file.     Family History  Problem Relation Age of Onset  . Cancer Father   . Anemia Daughter     Social History   Tobacco Use  . Smoking status: Current Some Day Smoker    Packs/day: 0.25    Types: Cigarettes  . Smokeless tobacco: Never  Used  . Tobacco comment: 1/2 caigarettes a day  Vaping Use  . Vaping Use: Never used  Substance Use Topics  . Alcohol use: Yes    Alcohol/week: 0.0 standard drinks    Comment: occasional  . Drug use: No    Home Medications Prior to Admission medications   Medication Sig Start Date End Date Taking? Authorizing Provider  acetaminophen (TYLENOL) 500 MG tablet Take 500 mg by mouth every 6 (six) hours as needed for moderate pain.   Yes [provider]  allopurinol (ZYLOPRIM) 100 MG tablet Take 100 mg by mouth daily.   Yes [provider]  amLODipine (NORVASC) 10 MG tablet Take 10 mg by mouth daily. 07/19/19  Yes [provider]  calcium carbonate (OSCAL) 1500 (600 Ca) MG TABS tablet Take 1,500 mg by mouth every evening.  07/26/19  Yes [provider]  carvedilol (COREG) 12.5 MG tablet Take 12.5 mg by mouth 2 (two) times daily with a meal.   Yes [provider]  docusate sodium (COLACE) 100 MG capsule Take 1 capsule (100 mg total) by mouth 2 (two) times daily. Patient taking differently: Take 100 mg by mouth daily as needed for mild constipation. 01/09/20  Yes Pahwani,  Rinka R, MD  donepezil (ARICEPT) 5 MG tablet Take 5 mg by mouth daily. 07/02/19  Yes [provider]  Ensure (ENSURE) Take 237 mLs by mouth 3 (three) times daily between meals.   Yes [provider]  FEROSUL 325 (65 Fe) MG tablet Take 325 mg by mouth every morning. 07/26/19  Yes [provider]  lacosamide (VIMPAT) 200 MG TABS tablet Take 1 tablet (200 mg total) by mouth 2 (two) times daily. 03/09/20  Yes Butch Penny, NP  levETIRAcetam (KEPPRA) 1000 MG tablet Take 1 tablet (1,000 mg total) by mouth 2 (two) times daily. 03/04/20  Yes Butch Penny, NP  losartan (COZAAR) 100 MG tablet Take 100 mg by mouth daily.   Yes [provider]  heparin 5000 UNIT/ML injection Inject 1 mL (5,000 Units total) into the skin every 12 (twelve) hours for 14 days. For DVT  prophylaxis after surgery Patient not taking: Reported on 03/25/2020 01/09/20 01/23/20  Vernetta Honey, PA-C  HYDROcodone-acetaminophen (NORCO) 5-325 MG tablet Take 1 tablet by mouth every 6 (six) hours as needed for moderate pain. MAXIMUM TOTAL ACETAMINOPHEN DOSE IS 4000 MG PER DAY Patient not taking: No sig reported 01/08/20   Vernetta Honey, PA-C  lacosamide (VIMPAT) 200 MG TABS tablet Take 1 tablet (200 mg total) by mouth 2 (two) times daily. Patient not taking: No sig reported 02/13/20   Butch Penny, NP  levETIRAcetam (KEPPRA) 1000 MG tablet Take 1 tablet (1,000 mg total) by mouth 2 (two) times daily. Patient not taking: No sig reported 03/04/20   Butch Penny, NP  Multiple Vitamin (MULTIVITAMIN WITH MINERALS) TABS tablet Take 1 tablet by mouth daily. Patient not taking: No sig reported 01/10/20   Pahwani, Kasandra Knudsen, MD  Nutritional Supplements (,FEEDING SUPPLEMENT, PROSOURCE PLUS) liquid Take 30 mLs by mouth 2 (two) times daily between meals. Patient not taking: No sig reported 01/10/20   Pahwani, Rinka R, MD  polyethylene glycol (MIRALAX / GLYCOLAX) 17 g packet Take 17 g by mouth daily as needed for mild constipation. Patient not taking: No sig reported 01/09/20   Pahwani, Kasandra Knudsen, MD    Allergies    Budesonide-formoterol fumarate, Lisinopril, Mirtazapine, and Sodium bicarbonate  Review of Systems   Review of Systems  Unable to perform ROS: Mental status change  Neurological: Positive for speech difficulty and weakness.    Physical Exam Updated Vital Signs BP (!) 143/66   Pulse 76   Temp 98.5 F (36.9 C) (Oral)   Resp 18   SpO2 98%   Physical Exam Vitals and nursing note reviewed.  Constitutional:      General: She is not in acute distress.    Appearance: She is well-developed, underweight and well-nourished.     Comments: Chronically ill appearing. underweight  HENT:     Head: Normocephalic and atraumatic.  Eyes:     Extraocular Movements: Extraocular  movements intact and EOM normal.     Conjunctiva/sclera: Conjunctivae normal.     Pupils: Pupils are equal, round, and reactive to light.     Comments: EOMI  Cardiovascular:     Rate and Rhythm: Normal rate and regular rhythm.     Pulses: Normal pulses and intact distal pulses.  Pulmonary:     Effort: Pulmonary effort is normal. No respiratory distress.     Breath sounds: Normal breath sounds. No wheezing.  Abdominal:     General: There is no distension.     Palpations: Abdomen is soft. There is no mass.  Tenderness: There is no abdominal tenderness. There is no guarding or rebound.  Musculoskeletal:        General: Normal range of motion.     Cervical back: Normal range of motion and neck supple.     Comments: Diffuse weakness in all extremities, but equal bilaterally.   Skin:    General: Skin is warm and dry.     Capillary Refill: Capillary refill takes less than 2 seconds.  Neurological:     Mental Status: She is alert and oriented to person, place, and time.     Comments: Tracks movement in the room.  When asked a question, she responds but speech is garbled and unintelligible.  Follow simple commands.  Diffuse weakness, but not worse on one side.  Psychiatric:        Mood and Affect: Mood and affect normal.     ED Results / Procedures / Treatments   Labs (all labs ordered are listed, but only abnormal results are displayed) Labs Reviewed  COMPREHENSIVE METABOLIC PANEL - Abnormal; Notable for the following components:      Result Value   Potassium 5.3 (*)    CO2 21 (*)    Glucose, Bld 107 (*)    BUN 29 (*)    Creatinine, Ser 1.42 (*)    Total Protein 9.0 (*)    GFR, Estimated 38 (*)    All other components within normal limits  CBC WITH DIFFERENTIAL/PLATELET - Abnormal; Notable for the following components:   RBC 3.27 (*)    Hemoglobin 10.7 (*)    HCT 32.5 (*)    RDW 18.2 (*)    All other components within normal limits  I-STAT CHEM 8, ED - Abnormal; Notable  for the following components:   Potassium 6.5 (*)    BUN 42 (*)    Creatinine, Ser 1.40 (*)    Glucose, Bld 102 (*)    All other components within normal limits  RESP PANEL BY RT-PCR (FLU A&B, COVID) ARPGX2  ETHANOL  PROTIME-INR  PROTIME-INR  APTT  RAPID URINE DRUG SCREEN, HOSP PERFORMED  URINALYSIS, ROUTINE W REFLEX MICROSCOPIC  LEVETIRACETAM LEVEL  CBG MONITORING, ED    EKG None  Radiology CT HEAD WO CONTRAST  Result Date: 03/25/2020 CLINICAL DATA:  Mental status change EXAM: CT HEAD WITHOUT CONTRAST TECHNIQUE: Contiguous axial images were obtained from the base of the skull through the vertex without intravenous contrast. COMPARISON:  03/22/2020 FINDINGS: Brain: There is no acute intracranial hemorrhage, mass effect, or edema. No new loss of gray-white differentiation. There is no extra-axial fluid collection. Chronic parasagittal left frontoparietal infarct. Confluent areas of hypoattenuation in the supratentorial white matter likely reflecting stable chronic microvascular ischemic changes. Ventricles and sulci are stable in size and configuration. Vascular: No hyperdense vessel.There is atherosclerotic calcification at the skull base. Skull: Calvarium is unremarkable. Sinuses/Orbits: No acute finding. Other: None. IMPRESSION: No acute intracranial abnormality. Stable chronic/nonemergent findings detailed above. Electronically Signed   By: Guadlupe Spanish M.D.   On: 03/25/2020 14:52    Procedures Procedures   Medications Ordered in ED Medications  sodium zirconium cyclosilicate (LOKELMA) packet 5 g (5 g Oral Given 03/25/20 1557)    ED Course  I have reviewed the triage vital signs and the nursing notes.  Pertinent labs & imaging results that were available during my care of the patient were reviewed by me and considered in my medical decision making (see chart for details).    MDM Rules/Calculators/A&P  Patient presenting for evaluation of  generalized weakness,'s and speech issues.  On exam, patient appears chronically ill and underweight, but not in acute distress.  However she is having speech difficulty.  No unilateral weakness.  Per son, patient has had decline for greater than 24 hours, although speech issues began in the last 1 to 2 hours.  As such, doubt stroke in the setting of weakness for several days.  Discussed with Dr. Wilford Corner from neurology, who reviewed patient's imaging.  Patient had similar presentation several months ago, was found to have seizures at that time.  This may be the cause of patient's symptoms.  Recommends Ativan, lab work-up, head CT.  The patient does not return to baseline, she may need EEG and MRI.  Labs interpreted by me, overall reassuring.  CT head negative for acute findings.  On reassessment, before Ativan has been given, patient has returned to baseline.  Speech is normal, patient is alert and oriented x3.  Weakness has also improved, and no unilateral weakness/decrease in station.  Discussed with Dr. Wilford Corner again from neurology, who states that as patient has returned to baseline, she no longer needs emergent EEG or MRI.  Recommends outpatient follow-up, patient has a neurologist that she sees.  Discussed findings and plan with patient and son, who are agreeable.  At this time, patient appears safe for discharge.  Return precautions given.  Patient and son state they understand and agree to plan.  Final Clinical Impression(s) / ED Diagnoses Final diagnoses:  Speech disturbance, unspecified type  Seizure disorder (HCC)  Hyperkalemia    Rx / DC Orders ED Discharge Orders    None       Alveria Apley, PA-C 03/25/20 1729    Pricilla Loveless, MD 03/27/20 [phone]

## 2020-03-26 ENCOUNTER — Other Ambulatory Visit: Payer: Self-pay

## 2020-03-26 ENCOUNTER — Encounter (HOSPITAL_COMMUNITY): Payer: Self-pay

## 2020-03-26 ENCOUNTER — Inpatient Hospital Stay (HOSPITAL_COMMUNITY)
Admission: EM | Admit: 2020-03-26 | Discharge: 2020-04-01 | DRG: 521 | Disposition: A | Discharge status: Skilled Nursing Facility | Payer: Medicare HMO | Attending: Family Medicine | Admitting: Family Medicine

## 2020-03-26 ENCOUNTER — Emergency Department (HOSPITAL_COMMUNITY): Payer: Medicare HMO

## 2020-03-26 DIAGNOSIS — F0280 Dementia in other diseases classified elsewhere without behavioral disturbance: Secondary | ICD-10-CM | POA: Diagnosis present

## 2020-03-26 DIAGNOSIS — R404 Transient alteration of awareness: Secondary | ICD-10-CM | POA: Diagnosis not present

## 2020-03-26 DIAGNOSIS — I130 Hypertensive heart and chronic kidney disease with heart failure and stage 1 through stage 4 chronic kidney disease, or unspecified chronic kidney disease: Secondary | ICD-10-CM | POA: Diagnosis present

## 2020-03-26 DIAGNOSIS — E43 Unspecified severe protein-calorie malnutrition: Secondary | ICD-10-CM | POA: Diagnosis present

## 2020-03-26 DIAGNOSIS — F17210 Nicotine dependence, cigarettes, uncomplicated: Secondary | ICD-10-CM | POA: Diagnosis present

## 2020-03-26 DIAGNOSIS — D631 Anemia in chronic kidney disease: Secondary | ICD-10-CM | POA: Diagnosis present

## 2020-03-26 DIAGNOSIS — I5042 Chronic combined systolic (congestive) and diastolic (congestive) heart failure: Secondary | ICD-10-CM | POA: Diagnosis present

## 2020-03-26 DIAGNOSIS — I69993 Ataxia following unspecified cerebrovascular disease: Secondary | ICD-10-CM

## 2020-03-26 DIAGNOSIS — S72002A Fracture of unspecified part of neck of left femur, initial encounter for closed fracture: Secondary | ICD-10-CM | POA: Diagnosis not present

## 2020-03-26 DIAGNOSIS — N1832 Chronic kidney disease, stage 3b: Secondary | ICD-10-CM | POA: Diagnosis present

## 2020-03-26 DIAGNOSIS — W06XXXA Fall from bed, initial encounter: Secondary | ICD-10-CM | POA: Diagnosis present

## 2020-03-26 DIAGNOSIS — G309 Alzheimer's disease, unspecified: Secondary | ICD-10-CM | POA: Diagnosis present

## 2020-03-26 DIAGNOSIS — Z20822 Contact with and (suspected) exposure to covid-19: Secondary | ICD-10-CM | POA: Diagnosis present

## 2020-03-26 DIAGNOSIS — R61 Generalized hyperhidrosis: Secondary | ICD-10-CM | POA: Diagnosis not present

## 2020-03-26 DIAGNOSIS — Z79899 Other long term (current) drug therapy: Secondary | ICD-10-CM

## 2020-03-26 DIAGNOSIS — Z8673 Personal history of transient ischemic attack (TIA), and cerebral infarction without residual deficits: Secondary | ICD-10-CM

## 2020-03-26 DIAGNOSIS — R7989 Other specified abnormal findings of blood chemistry: Secondary | ICD-10-CM | POA: Diagnosis not present

## 2020-03-26 DIAGNOSIS — I428 Other cardiomyopathies: Secondary | ICD-10-CM | POA: Diagnosis present

## 2020-03-26 DIAGNOSIS — I447 Left bundle-branch block, unspecified: Secondary | ICD-10-CM | POA: Diagnosis present

## 2020-03-26 DIAGNOSIS — D62 Acute posthemorrhagic anemia: Secondary | ICD-10-CM | POA: Diagnosis not present

## 2020-03-26 DIAGNOSIS — E861 Hypovolemia: Secondary | ICD-10-CM | POA: Diagnosis present

## 2020-03-26 DIAGNOSIS — E872 Acidosis: Secondary | ICD-10-CM | POA: Diagnosis not present

## 2020-03-26 DIAGNOSIS — N179 Acute kidney failure, unspecified: Secondary | ICD-10-CM | POA: Diagnosis not present

## 2020-03-26 DIAGNOSIS — Z809 Family history of malignant neoplasm, unspecified: Secondary | ICD-10-CM

## 2020-03-26 DIAGNOSIS — G40909 Epilepsy, unspecified, not intractable, without status epilepticus: Secondary | ICD-10-CM

## 2020-03-26 DIAGNOSIS — Z681 Body mass index (BMI) 19 or less, adult: Secondary | ICD-10-CM

## 2020-03-26 DIAGNOSIS — R52 Pain, unspecified: Secondary | ICD-10-CM | POA: Diagnosis not present

## 2020-03-26 DIAGNOSIS — Z043 Encounter for examination and observation following other accident: Secondary | ICD-10-CM | POA: Diagnosis not present

## 2020-03-26 DIAGNOSIS — J80 Acute respiratory distress syndrome: Secondary | ICD-10-CM | POA: Diagnosis not present

## 2020-03-26 DIAGNOSIS — Z8781 Personal history of (healed) traumatic fracture: Secondary | ICD-10-CM

## 2020-03-26 DIAGNOSIS — Z888 Allergy status to other drugs, medicaments and biological substances status: Secondary | ICD-10-CM

## 2020-03-26 DIAGNOSIS — I2510 Atherosclerotic heart disease of native coronary artery without angina pectoris: Secondary | ICD-10-CM | POA: Diagnosis present

## 2020-03-26 DIAGNOSIS — R6510 Systemic inflammatory response syndrome (SIRS) of non-infectious origin without acute organ dysfunction: Secondary | ICD-10-CM | POA: Diagnosis present

## 2020-03-26 DIAGNOSIS — I517 Cardiomegaly: Secondary | ICD-10-CM | POA: Diagnosis not present

## 2020-03-26 DIAGNOSIS — R479 Unspecified speech disturbances: Secondary | ICD-10-CM | POA: Diagnosis present

## 2020-03-26 DIAGNOSIS — W19XXXA Unspecified fall, initial encounter: Secondary | ICD-10-CM | POA: Diagnosis not present

## 2020-03-26 DIAGNOSIS — S72012A Unspecified intracapsular fracture of left femur, initial encounter for closed fracture: Principal | ICD-10-CM | POA: Diagnosis present

## 2020-03-26 NOTE — ED Triage Notes (Signed)
Pt to ED by EMS from home following mechanical fall from her bed, unwitnessed onto wooden floor. Pt was seen here yesterday for confusion and possible stroke.Pt denies any pain, however when moved, complains of left leg pain. Hx of dementia. Shortening of the LLE noted. Pt arrives in [place] as precautionary measure, denies any LOC. Arrives A+O at baseline, VSS, NADN.

## 2020-03-26 NOTE — ED Provider Notes (Addendum)
[facility]-EMERGENCY DEPT Provider Note   CSN: [id number] Arrival date & time: 03/26/20  2216     History Chief Complaint  Patient presents with  . Fall    [name] is a 80 y.o. female.  Patient presents to the emergency department for evaluation after a fall.  Fall was unwitnessed, she was found next to her bed, presumed to have fallen out of bed onto a hard wooden floor.  Patient has a history of dementia, does not remember falling.  She denies any problems when asked if she has any pain.        Past Medical History:  Diagnosis Date  . Abdominal pain 02/24/2013  . Acute combined systolic and diastolic congestive heart failure (HCC) 02/24/2013  . Acute on chronic renal insufficiency 05/15/2015  . Acute renal failure (HCC) 12/28/2012  . Acute renal failure superimposed on stage 3 chronic kidney disease (HCC)   . Anemia 05/20/2015  . 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 Basal ganglia infarction (HCC) 01/08/2013  . CAP (community acquired pneumonia)   . Chest pain 05/14/2015  . Chronic combined systolic and diastolic CHF (congestive heart failure) (HCC) 03/06/2014  . Congestive heart failure (CHF) (HCC)   . Constipation 02/24/2013  . Cough   . Diastolic dysfunction-grade 3 05/20/2015  . Dyspnea 02/24/2013  . Encephalopathy 12/28/2012  . Epilepsy (HCC)   . Generalized seizure (HCC) 12/28/2012  . H/O: CVA (cerebrovascular accident) 01/04/2013  . Hyperammonemia (HCC) 05/17/2015  . Hyperglycemia 02/24/2013  . Hyperkalemia 02/11/2013  . Hypertension   . 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 Hyponatremia 02/11/2013  . Influenza A 02/11/2013  . Liver enzyme elevation 02/11/2013  . Metabolic acidosis 12/28/2012  . Metabolic encephalopathy 05/16/2015  . NICM (nonischemic cardiomyopathy) -EF 25% 05/20/2015  . Pleuritic chest pain 02/24/2013  . PNA (pneumonia) 05/20/2015  . Pneumobilia 02/12/2013  . Pneumonia   . Seizure  (HCC) 12/28/2012  . Seizures (HCC)   . Smoker   . Stroke (HCC)   . Troponin level elevated 05/14/2015  . Volume overload 02/24/2013  . Weight gain 02/24/2013    Patient Active Problem List   Diagnosis Date Noted  . Intertrochanteric fracture of right femur (HCC) 01/04/2020  . Humeral head fracture 01/04/2020  . Humeral surgical neck fracture 01/04/2020  . Witnessed seizure-like activity (HCC) 12/27/2019  . Chronic kidney disease, stage 3b (HCC) 12/27/2019  . Alzheimer's dementia without behavioral disturbance (HCC) 12/27/2019  . Gout 12/27/2019  . Leukocytosis 12/27/2019  . Cough   . Encounter for palliative care   . Goals of care, counseling/discussion   . Pneumonia   . NICM (nonischemic cardiomyopathy) -EF 25% 05/20/2015  . No significant CAD at cath May 2012 05/20/2015  . Anemia 05/20/2015  . Diastolic dysfunction-grade 3 05/20/2015  . Acute renal failure superimposed on stage 3 chronic kidney disease (HCC)   . CAP (community acquired pneumonia)   . Hyperammonemia (HCC) 05/17/2015  . Metabolic encephalopathy 05/16/2015  . Acute on chronic renal insufficiency 05/15/2015  . Chest pain 05/14/2015  . Troponin level elevated 05/14/2015  . Chronic combined systolic and diastolic congestive heart failure (HCC) 03/06/2014  . Acute combined systolic and diastolic congestive heart failure (HCC) 02/24/2013  . Weight gain 02/24/2013  . Volume overload 02/24/2013  . Hyperglycemia 02/24/2013  . Dyspnea 02/24/2013  . Constipation 02/24/2013  . Abdominal pain 02/24/2013  . Pleuritic chest pain 02/24/2013  . Epilepsy (HCC)   . Smoker   .  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 Protein-calorie malnutrition, severe- wgt 86 lbs 02/13/2013  . Pneumobilia 02/12/2013  . Influenza A 02/11/2013  . Hyperkalemia 02/11/2013  . Hyponatremia 02/11/2013  . HCAP (healthcare-associated pneumonia) 02/11/2013  . Liver enzyme elevation 02/11/2013  . Basal ganglia infarction  (HCC) 01/08/2013  . H/O: CVA (cerebrovascular accident) 01/04/2013  . Generalized seizure (HCC) 12/28/2012  . 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 Seizure disorder (HCC) 12/28/2012  . Metabolic acidosis 12/28/2012  . Encephalopathy 12/28/2012  . Acute renal failure (HCC) 12/28/2012    Past Surgical History:  Procedure Laterality Date  . FEMUR IM NAIL Right 01/05/2020  . INTRAMEDULLARY (IM) NAIL INTERTROCHANTERIC Right 01/05/2020   Procedure: INTRAMEDULLARY (IM) NAIL INTERTROCHANTRIC;  Surgeon: Bjorn PippinVarkey, Dax T, MD;  Location: [facility] OR;  Service: Orthopedics;  Laterality: Right;  . NO PAST SURGERIES       OB History   No obstetric history on file.     Family History  Problem Relation Age of Onset  . Cancer Father   . Anemia Daughter     Social History   Tobacco Use  . Smoking status: Current Some Day Smoker    Packs/day: 0.25    Types: Cigarettes  . Smokeless tobacco: Never Used  . Tobacco comment: 1/2 caigarettes a day  Vaping Use  . Vaping Use: Never used  Substance Use Topics  . Alcohol use: Yes    Alcohol/week: 0.0 standard drinks    Comment: occasional  . Drug use: No    Home Medications Prior to Admission medications   Medication Sig Start Date End Date Taking? Authorizing Provider  acetaminophen (TYLENOL) 500 MG tablet Take 500 mg by mouth every 6 (six) hours as needed for moderate pain.    [provider]  allopurinol (ZYLOPRIM) 100 MG tablet Take 100 mg by mouth daily.    [provider]  amLODipine (NORVASC) 10 MG tablet Take 10 mg by mouth daily. 07/19/19   [provider]  calcium carbonate (OSCAL) 1500 (600 Ca) MG TABS tablet Take 1,500 mg by mouth every evening.  07/26/19   [provider]  carvedilol (COREG) 12.5 MG tablet Take 12.5 mg by mouth 2 (two) times daily with a meal.    [provider]  docusate sodium (COLACE) 100 MG capsule Take 1 capsule (100 mg total) by mouth 2 (two) times  daily. Patient taking differently: Take 100 mg by mouth daily as needed for mild constipation. 01/09/20   Pahwani, Kasandra Knudseninka R, MD  donepezil (ARICEPT) 5 MG tablet Take 5 mg by mouth daily. 07/02/19   [provider]  Ensure (ENSURE) Take 237 mLs by mouth 3 (three) times daily between meals.    [provider]  FEROSUL 325 (65 Fe) MG tablet Take 325 mg by mouth every morning. 07/26/19   [provider]  heparin 5000 UNIT/ML injection Inject 1 mL (5,000 Units total) into the skin every 12 (twelve) hours for 14 days. For DVT prophylaxis after surgery Patient not taking: Reported on 03/25/2020 01/09/20 01/23/20  Vernetta HoneyMcBane, Caroline N, PA-C  HYDROcodone-acetaminophen (NORCO) 5-325 MG tablet Take 1 tablet by mouth every 6 (six) hours as needed for moderate pain. MAXIMUM TOTAL ACETAMINOPHEN DOSE IS 4000 MG PER DAY Patient not taking: No sig reported 01/08/20   Vernetta HoneyMcBane, Caroline N, PA-C  lacosamide (VIMPAT) 200 MG TABS tablet Take 1 tablet (200 mg total) by mouth 2 (two) times daily. Patient not taking: No sig reported 02/13/20  Butch Penny, NP  lacosamide (VIMPAT) 200 MG TABS tablet Take 1 tablet (200 mg total) by mouth 2 (two) times daily. 03/09/20   Butch Penny, NP  levETIRAcetam (KEPPRA) 1000 MG tablet Take 1 tablet (1,000 mg total) by mouth 2 (two) times daily. 03/04/20   Butch Penny, NP  levETIRAcetam (KEPPRA) 1000 MG tablet Take 1 tablet (1,000 mg total) by mouth 2 (two) times daily. Patient not taking: No sig reported 03/04/20   Butch Penny, NP  losartan (COZAAR) 100 MG tablet Take 100 mg by mouth daily.    [provider]  Multiple Vitamin (MULTIVITAMIN WITH MINERALS) TABS tablet Take 1 tablet by mouth daily. Patient not taking: No sig reported 01/10/20   Pahwani, Kasandra Knudsen, MD  Nutritional Supplements (,FEEDING SUPPLEMENT, PROSOURCE PLUS) liquid Take 30 mLs by mouth 2 (two) times daily between meals. Patient not taking: No sig reported 01/10/20   Pahwani, Rinka R,  MD  polyethylene glycol (MIRALAX / GLYCOLAX) 17 g packet Take 17 g by mouth daily as needed for mild constipation. Patient not taking: No sig reported 01/09/20   Pahwani, Kasandra Knudsen, MD    Allergies    Budesonide-formoterol fumarate, Lisinopril, Mirtazapine, and Sodium bicarbonate  Review of Systems   Review of Systems  Unable to perform ROS: Dementia    Physical Exam Updated Vital Signs BP (!) 149/74 (BP Location: Left Arm)   Pulse (!) 109   Temp 98.1 F (36.7 C) (Oral)   Resp (!) 21   Ht 5' (1.524 m)   Wt 38 kg   SpO2 96%   BMI 16.36 kg/m   Physical Exam Vitals and nursing note reviewed.  Constitutional:      General: She is not in acute distress.    Appearance: Normal appearance. She is well-developed and well-nourished.  HENT:     Head: Normocephalic and atraumatic.     Right Ear: Hearing normal.     Left Ear: Hearing normal.     Nose: Nose normal.     Mouth/Throat:     Mouth: Oropharynx is clear and moist and mucous membranes are normal.  Eyes:     Extraocular Movements: EOM normal.     Conjunctiva/sclera: Conjunctivae normal.     Pupils: Pupils are equal, round, and reactive to light.  Cardiovascular:     Rate and Rhythm: Regular rhythm.     Heart sounds: S1 normal and S2 normal. No murmur heard. No friction rub. No gallop.   Pulmonary:     Effort: Pulmonary effort is normal. No respiratory distress.     Breath sounds: Normal breath sounds.  Chest:     Chest wall: No tenderness.  Abdominal:     General: Bowel sounds are normal.     Palpations: Abdomen is soft. There is no hepatosplenomegaly.     Tenderness: There is no abdominal tenderness. There is no guarding or rebound. Negative signs include Murphy's sign and McBurney's sign.     Hernia: No hernia is present.  Musculoskeletal:     Cervical back: Normal range of motion and neck supple.     Left hip: Deformity (Shortening) present. Decreased range of motion.     Left knee: No swelling, deformity or  effusion. Tenderness present.  Skin:    General: Skin is warm, dry and intact.     Findings: No rash.     Nails: There is no cyanosis.  Neurological:     General: No focal deficit present.     Mental Status: She  is alert. She is confused.     GCS: GCS eye subscore is 4. GCS verbal subscore is 4. GCS motor subscore is 6.     Cranial Nerves: No cranial nerve deficit.     Sensory: No sensory deficit.     Coordination: Coordination normal.     Deep Tendon Reflexes: Strength normal.  Psychiatric:        Mood and Affect: Mood and affect normal.        Speech: Speech is delayed.     ED Results / Procedures / Treatments   Labs (all labs ordered are listed, but only abnormal results are displayed) Labs Reviewed  CBC  BASIC METABOLIC PANEL  PROTIME-INR    EKG None  Radiology DG Chest 1 View  Result Date: 03/27/2020 CLINICAL DATA:  Larey Seat from the bed. EXAM: CHEST  1 VIEW COMPARISON:  01/04/2020 FINDINGS: Chronic cardiomegaly and aortic atherosclerosis. Possible pulmonary venous hypertension but no frank edema. No consolidation or collapse. No effusion. No acute fracture identified. Chronic/healing fracture of the right humeral head. IMPRESSION: Cardiomegaly and aortic atherosclerosis. Possible pulmonary venous hypertension. No frank edema. Electronically Signed   By: Paulina Fusi M.D.   On: 03/27/2020 00:00   CT HEAD WO CONTRAST  Result Date: 03/27/2020 CLINICAL DATA:  Status post fall. EXAM: CT HEAD WITHOUT CONTRAST CT CERVICAL SPINE WITHOUT CONTRAST TECHNIQUE: Multidetector CT imaging of the head and cervical spine was performed following the standard protocol without intravenous contrast. Multiplanar CT image reconstructions of the cervical spine were also generated. COMPARISON:  None. FINDINGS: CT HEAD FINDINGS Brain: There is mild cerebral atrophy with widening of the extra-axial spaces and ventricular dilatation. There are areas of decreased attenuation within the white matter tracts  of the supratentorial brain, consistent with microvascular disease changes. Vascular: No hyperdense vessel or unexpected calcification. Skull: Normal. Negative for fracture or focal lesion. Sinuses/Orbits: No acute finding. Other: None. CT CERVICAL SPINE FINDINGS Alignment: There is approximately 1 mm to 2 mm retrolisthesis of the C4 vertebral body on C5. Skull base and vertebrae: No acute fracture. No primary bone lesion or focal pathologic process. Soft tissues and spinal canal: No prevertebral fluid or swelling. No visible canal hematoma. Disc levels: Mild to moderate severity endplate sclerosis is seen at the levels of C2-C3, and C4-C5. Mild anterior osteophyte formation is present at the levels of C3-C4 and C5-C6. Moderate severity intervertebral disc space narrowing is seen at the levels of C2-C3, C4-C5 and C5-C6. Normal bilateral multilevel facet joints are noted. Upper chest: Negative. Other: N/A IMPRESSION: 1. Mild cerebral atrophy and microvascular disease changes of the supratentorial brain. 2. No evidence of an acute fracture or subluxation of the cervical spine. 3. Moderate severity degenerative changes of the cervical spine, as described above. Electronically Signed   By: Aram Candela M.D.   On: 03/27/2020 00:16   CT HEAD WO CONTRAST  Result Date: 03/25/2020 CLINICAL DATA:  Mental status change EXAM: CT HEAD WITHOUT CONTRAST TECHNIQUE: Contiguous axial images were obtained from the base of the skull through the vertex without intravenous contrast. COMPARISON:  03/22/2020 FINDINGS: Brain: There is no acute intracranial hemorrhage, mass effect, or edema. No new loss of gray-white differentiation. There is no extra-axial fluid collection. Chronic parasagittal left frontoparietal infarct. Confluent areas of hypoattenuation in the supratentorial white matter likely reflecting stable chronic microvascular ischemic changes. Ventricles and sulci are stable in size and configuration. Vascular: No  hyperdense vessel.There is atherosclerotic calcification at the skull base. Skull: Calvarium is unremarkable. Sinuses/Orbits:  No acute finding. Other: None. IMPRESSION: No acute intracranial abnormality. Stable chronic/nonemergent findings detailed above. Electronically Signed   By: Guadlupe Spanish M.D.   On: 03/25/2020 14:52   CT CERVICAL SPINE WO CONTRAST  Result Date: 03/27/2020 CLINICAL DATA:  Status post fall. EXAM: CT HEAD WITHOUT CONTRAST CT CERVICAL SPINE WITHOUT CONTRAST TECHNIQUE: Multidetector CT imaging of the head and cervical spine was performed following the standard protocol without intravenous contrast. Multiplanar CT image reconstructions of the cervical spine were also generated. COMPARISON:  None. FINDINGS: CT HEAD FINDINGS Brain: There is mild cerebral atrophy with widening of the extra-axial spaces and ventricular dilatation. There are areas of decreased attenuation within the white matter tracts of the supratentorial brain, consistent with microvascular disease changes. Vascular: No hyperdense vessel or unexpected calcification. Skull: Normal. Negative for fracture or focal lesion. Sinuses/Orbits: No acute finding. Other: None. CT CERVICAL SPINE FINDINGS Alignment: There is approximately 1 mm to 2 mm retrolisthesis of the C4 vertebral body on C5. Skull base and vertebrae: No acute fracture. No primary bone lesion or focal pathologic process. Soft tissues and spinal canal: No prevertebral fluid or swelling. No visible canal hematoma. Disc levels: Mild to moderate severity endplate sclerosis is seen at the levels of C2-C3, and C4-C5. Mild anterior osteophyte formation is present at the levels of C3-C4 and C5-C6. Moderate severity intervertebral disc space narrowing is seen at the levels of C2-C3, C4-C5 and C5-C6. Normal bilateral multilevel facet joints are noted. Upper chest: Negative. Other: N/A IMPRESSION: 1. Mild cerebral atrophy and microvascular disease changes of the supratentorial  brain. 2. No evidence of an acute fracture or subluxation of the cervical spine. 3. Moderate severity degenerative changes of the cervical spine, as described above. Electronically Signed   By: Aram Candela M.D.   On: 03/27/2020 00:16   DG Knee Complete 4 Views Left  Result Date: 03/27/2020 CLINICAL DATA:  Status post fall. EXAM: LEFT KNEE - COMPLETE 4+ VIEW COMPARISON:  None. FINDINGS: No evidence of fracture, dislocation, or joint effusion. No evidence of arthropathy or other focal bone abnormality. Soft tissues are unremarkable. IMPRESSION: Negative. Electronically Signed   By: Aram Candela M.D.   On: 03/27/2020 00:04   DG Hip Unilat W or Wo Pelvis 2-3 Views Left  Result Date: 03/27/2020 CLINICAL DATA:  Status post fall. EXAM: DG HIP (WITH OR WITHOUT PELVIS) 2-3V LEFT COMPARISON:  None. FINDINGS: Acute fracture deformity is seen extending through the neck of the proximal left femur. Approximately 1 shaft with superior displacement of the distal fracture site is noted. A radiopaque intramedullary rod and compression screw device are seen within the proximal right femur. There is no evidence of dislocation. There is no evidence of arthropathy or other focal bone abnormality. IMPRESSION: Acute fracture of the proximal left femur. Electronically Signed   By: Aram Candela M.D.   On: 03/27/2020 00:04    Procedures Procedures   Medications Ordered in ED Medications - No data to display  ED Course  I have reviewed the triage vital signs and the nursing notes.  Pertinent labs & imaging results that were available during my care of the patient were reviewed by me and considered in my medical decision making (see chart for details).    MDM Rules/Calculators/A&P                          Patient presents to the emergency department for evaluation after a fall. Patient was presumed to have  fallen out of bed, fall was unwitnessed. Patient does have baseline dementia and does not remember  the fall. She reported no pain at arrival but examination revealed significant pain with movement of the left leg, specifically at the hip. X-ray of left hip reveals proximal femur fracture. Patient will require hospitalization for surgical repair.  Reviewing her records, Dr. Everardo Pacific did repair her right hip after a fracture.  Multiple attempts to reach his group, however, were unsuccessful.  I discussed this with the patient's son and he reported that he did not have a strong preference, was okay with contacting on-call orthopedics.  Discussed briefly with Dr. Shon Baton, recommends keeping patient n.p.o.  Addendum: Dr. Carola Frost has returned the call and will speak with Dr. Shon Baton, they will determine which service will see the patient for surgical intervention.  Final Clinical Impression(s) / ED Diagnoses Final diagnoses:  Closed fracture of left hip, initial encounter [facility])    Rx / DC Orders ED Discharge Orders    None       Jaylyne Breese, Canary Brim, MD 03/27/20 0022    Gilda Crease, MD 03/27/20 0153    Gilda Crease, MD 03/27/20 [id number]

## 2020-03-27 ENCOUNTER — Encounter (HOSPITAL_COMMUNITY): Payer: Self-pay | Admitting: Family Medicine

## 2020-03-27 DIAGNOSIS — S72002D Fracture of unspecified part of neck of left femur, subsequent encounter for closed fracture with routine healing: Secondary | ICD-10-CM | POA: Diagnosis not present

## 2020-03-27 DIAGNOSIS — S72002A Fracture of unspecified part of neck of left femur, initial encounter for closed fracture: Secondary | ICD-10-CM | POA: Diagnosis not present

## 2020-03-27 DIAGNOSIS — G40909 Epilepsy, unspecified, not intractable, without status epilepticus: Secondary | ICD-10-CM | POA: Diagnosis not present

## 2020-03-27 DIAGNOSIS — S72012A Unspecified intracapsular fracture of left femur, initial encounter for closed fracture: Secondary | ICD-10-CM | POA: Diagnosis not present

## 2020-03-27 DIAGNOSIS — Z79899 Other long term (current) drug therapy: Secondary | ICD-10-CM | POA: Diagnosis not present

## 2020-03-27 DIAGNOSIS — I69351 Hemiplegia and hemiparesis following cerebral infarction affecting right dominant side: Secondary | ICD-10-CM | POA: Diagnosis not present

## 2020-03-27 DIAGNOSIS — R279 Unspecified lack of coordination: Secondary | ICD-10-CM | POA: Diagnosis not present

## 2020-03-27 DIAGNOSIS — D631 Anemia in chronic kidney disease: Secondary | ICD-10-CM | POA: Diagnosis not present

## 2020-03-27 DIAGNOSIS — R479 Unspecified speech disturbances: Secondary | ICD-10-CM | POA: Diagnosis present

## 2020-03-27 DIAGNOSIS — W06XXXA Fall from bed, initial encounter: Secondary | ICD-10-CM | POA: Diagnosis present

## 2020-03-27 DIAGNOSIS — N1832 Chronic kidney disease, stage 3b: Secondary | ICD-10-CM

## 2020-03-27 DIAGNOSIS — Z9181 History of falling: Secondary | ICD-10-CM | POA: Diagnosis not present

## 2020-03-27 DIAGNOSIS — I69993 Ataxia following unspecified cerebrovascular disease: Secondary | ICD-10-CM | POA: Diagnosis not present

## 2020-03-27 DIAGNOSIS — D649 Anemia, unspecified: Secondary | ICD-10-CM

## 2020-03-27 DIAGNOSIS — E43 Unspecified severe protein-calorie malnutrition: Secondary | ICD-10-CM | POA: Diagnosis not present

## 2020-03-27 DIAGNOSIS — R410 Disorientation, unspecified: Secondary | ICD-10-CM | POA: Diagnosis not present

## 2020-03-27 DIAGNOSIS — W19XXXA Unspecified fall, initial encounter: Secondary | ICD-10-CM | POA: Diagnosis not present

## 2020-03-27 DIAGNOSIS — F0390 Unspecified dementia without behavioral disturbance: Secondary | ICD-10-CM | POA: Diagnosis not present

## 2020-03-27 DIAGNOSIS — Z8781 Personal history of (healed) traumatic fracture: Secondary | ICD-10-CM | POA: Diagnosis not present

## 2020-03-27 DIAGNOSIS — F17210 Nicotine dependence, cigarettes, uncomplicated: Secondary | ICD-10-CM | POA: Diagnosis present

## 2020-03-27 DIAGNOSIS — Z681 Body mass index (BMI) 19 or less, adult: Secondary | ICD-10-CM | POA: Diagnosis not present

## 2020-03-27 DIAGNOSIS — I428 Other cardiomyopathies: Secondary | ICD-10-CM | POA: Diagnosis not present

## 2020-03-27 DIAGNOSIS — Z20822 Contact with and (suspected) exposure to covid-19: Secondary | ICD-10-CM | POA: Diagnosis not present

## 2020-03-27 DIAGNOSIS — R569 Unspecified convulsions: Secondary | ICD-10-CM | POA: Diagnosis not present

## 2020-03-27 DIAGNOSIS — D62 Acute posthemorrhagic anemia: Secondary | ICD-10-CM | POA: Diagnosis not present

## 2020-03-27 DIAGNOSIS — I69391 Dysphagia following cerebral infarction: Secondary | ICD-10-CM | POA: Diagnosis not present

## 2020-03-27 DIAGNOSIS — R52 Pain, unspecified: Secondary | ICD-10-CM | POA: Diagnosis not present

## 2020-03-27 DIAGNOSIS — Z809 Family history of malignant neoplasm, unspecified: Secondary | ICD-10-CM | POA: Diagnosis not present

## 2020-03-27 DIAGNOSIS — Z8673 Personal history of transient ischemic attack (TIA), and cerebral infarction without residual deficits: Secondary | ICD-10-CM

## 2020-03-27 DIAGNOSIS — F0280 Dementia in other diseases classified elsewhere without behavioral disturbance: Secondary | ICD-10-CM

## 2020-03-27 DIAGNOSIS — I5042 Chronic combined systolic (congestive) and diastolic (congestive) heart failure: Secondary | ICD-10-CM

## 2020-03-27 DIAGNOSIS — R1312 Dysphagia, oropharyngeal phase: Secondary | ICD-10-CM | POA: Diagnosis not present

## 2020-03-27 DIAGNOSIS — Z743 Need for continuous supervision: Secondary | ICD-10-CM | POA: Diagnosis not present

## 2020-03-27 DIAGNOSIS — E872 Acidosis: Secondary | ICD-10-CM | POA: Diagnosis not present

## 2020-03-27 DIAGNOSIS — N179 Acute kidney failure, unspecified: Secondary | ICD-10-CM | POA: Diagnosis not present

## 2020-03-27 DIAGNOSIS — G309 Alzheimer's disease, unspecified: Secondary | ICD-10-CM | POA: Diagnosis not present

## 2020-03-27 DIAGNOSIS — I2510 Atherosclerotic heart disease of native coronary artery without angina pectoris: Secondary | ICD-10-CM | POA: Diagnosis present

## 2020-03-27 DIAGNOSIS — Z888 Allergy status to other drugs, medicaments and biological substances status: Secondary | ICD-10-CM | POA: Diagnosis not present

## 2020-03-27 DIAGNOSIS — E861 Hypovolemia: Secondary | ICD-10-CM | POA: Diagnosis present

## 2020-03-27 DIAGNOSIS — I130 Hypertensive heart and chronic kidney disease with heart failure and stage 1 through stage 4 chronic kidney disease, or unspecified chronic kidney disease: Secondary | ICD-10-CM | POA: Diagnosis not present

## 2020-03-27 DIAGNOSIS — Z96642 Presence of left artificial hip joint: Secondary | ICD-10-CM | POA: Diagnosis not present

## 2020-03-27 DIAGNOSIS — R2681 Unsteadiness on feet: Secondary | ICD-10-CM | POA: Diagnosis not present

## 2020-03-27 DIAGNOSIS — R6510 Systemic inflammatory response syndrome (SIRS) of non-infectious origin without acute organ dysfunction: Secondary | ICD-10-CM | POA: Diagnosis not present

## 2020-03-27 DIAGNOSIS — S72142A Displaced intertrochanteric fracture of left femur, initial encounter for closed fracture: Secondary | ICD-10-CM | POA: Diagnosis not present

## 2020-03-27 DIAGNOSIS — I447 Left bundle-branch block, unspecified: Secondary | ICD-10-CM | POA: Diagnosis present

## 2020-03-27 DIAGNOSIS — M6281 Muscle weakness (generalized): Secondary | ICD-10-CM | POA: Diagnosis not present

## 2020-03-27 DIAGNOSIS — I5043 Acute on chronic combined systolic (congestive) and diastolic (congestive) heart failure: Secondary | ICD-10-CM | POA: Diagnosis not present

## 2020-03-27 DIAGNOSIS — Z471 Aftercare following joint replacement surgery: Secondary | ICD-10-CM | POA: Diagnosis not present

## 2020-03-27 LAB — BASIC METABOLIC PANEL
Anion gap: 11 (ref 5–15)
Anion gap: 11 (ref 5–15)
BUN: 27 mg/dL — ABNORMAL HIGH (ref 8–23)
BUN: 25 mg/dL — ABNORMAL HIGH (ref 8–23)
CO2: 19 mmol/L — ABNORMAL LOW (ref 22–32)
CO2: 20 mmol/L — ABNORMAL LOW (ref 22–32)
Calcium: 9.8 mg/dL (ref 8.9–10.3)
Calcium: 9.5 mg/dL (ref 8.9–10.3)
Chloride: 108 mmol/L (ref 98–111)
Chloride: 106 mmol/L (ref 98–111)
Creatinine, Ser: 1.52 mg/dL — ABNORMAL HIGH (ref 0.44–1.00)
Creatinine, Ser: 1.35 mg/dL — ABNORMAL HIGH (ref 0.44–1.00)
GFR, Estimated: 35 mL/min — ABNORMAL LOW (ref >60)
GFR, Estimated: 40 mL/min — ABNORMAL LOW (ref >60)
Glucose, Bld: 147 mg/dL — ABNORMAL HIGH (ref 70–99)
Glucose, Bld: 128 mg/dL — ABNORMAL HIGH (ref 70–99)
Potassium: 5.1 mmol/L (ref 3.5–5.1)
Potassium: 4.3 mmol/L (ref 3.5–5.1)
Sodium: 138 mmol/L (ref 135–145)
Sodium: 137 mmol/L (ref 135–145)

## 2020-03-27 LAB — CBC
HCT: 35.6 % — ABNORMAL LOW (ref 36.0–46.0)
HCT: 37.5 % (ref 36.0–46.0)
Hemoglobin: 11.8 g/dL — ABNORMAL LOW (ref 12.0–15.0)
Hemoglobin: 12.4 g/dL (ref 12.0–15.0)
MCH: 32.1 pg (ref 26.0–34.0)
MCH: 32.6 pg (ref 26.0–34.0)
MCHC: 33.1 g/dL (ref 30.0–36.0)
MCHC: 33.1 g/dL (ref 30.0–36.0)
MCV: 97.2 fL (ref 80.0–100.0)
MCV: 98.3 fL (ref 80.0–100.0)
Platelets: 280 10*3/uL (ref 150–400)
Platelets: 311 10*3/uL (ref 150–400)
RBC: 3.62 MIL/uL — ABNORMAL LOW (ref 3.87–5.11)
RBC: 3.86 MIL/uL — ABNORMAL LOW (ref 3.87–5.11)
RDW: 17.6 % — ABNORMAL HIGH (ref 11.5–15.5)
RDW: 17.8 % — ABNORMAL HIGH (ref 11.5–15.5)
WBC: 12.4 10*3/uL — ABNORMAL HIGH (ref 4.0–10.5)
WBC: 16 10*3/uL — ABNORMAL HIGH (ref 4.0–10.5)
nRBC: 0 % (ref 0.0–0.2)
nRBC: 0 % (ref 0.0–0.2)

## 2020-03-27 LAB — PROTIME-INR
INR: 0.9 (ref 0.8–1.2)
Prothrombin Time: 11.7 seconds (ref 11.4–15.2)

## 2020-03-27 LAB — TYPE AND SCREEN
ABO/RH(D): A POS
Antibody Screen: NEGATIVE

## 2020-03-27 LAB — RESP PANEL BY RT-PCR (FLU A&B, COVID) ARPGX2
Influenza A by PCR: NEGATIVE
Influenza B by PCR: NEGATIVE
SARS Coronavirus 2 by RT PCR: NEGATIVE

## 2020-03-27 MED ORDER — LACOSAMIDE 50 MG PO TABS
200.0000 mg | ORAL_TABLET | Freq: Two times a day (BID) | ORAL | Status: DC
  Administered 2020-03-27 – 2020-04-01 (×11): 200 mg via ORAL
  Filled 2020-03-27 (×11): qty 4

## 2020-03-27 MED ORDER — LABETALOL HCL 5 MG/ML IV SOLN
10.0000 mg | Freq: Four times a day (QID) | INTRAVENOUS | Status: DC | PRN
  Filled 2020-03-27 (×2): qty 4

## 2020-03-27 MED ORDER — MORPHINE SULFATE (PF) 2 MG/ML IV SOLN
2.0000 mg | Freq: Once | INTRAVENOUS | Status: DC
Start: 2020-03-27 — End: 2020-03-27
  Filled 2020-03-27: qty 1

## 2020-03-27 MED ORDER — LEVETIRACETAM 500 MG PO TABS
1000.0000 mg | ORAL_TABLET | Freq: Two times a day (BID) | ORAL | Status: DC
  Administered 2020-03-27 – 2020-04-01 (×11): 1000 mg via ORAL
  Filled 2020-03-27 (×11): qty 2

## 2020-03-27 MED ORDER — ENSURE SURGERY PO LIQD
237.0000 mL | Freq: Two times a day (BID) | ORAL | Status: DC
  Administered 2020-03-27 – 2020-04-01 (×6): 237 mL via ORAL

## 2020-03-27 MED ORDER — ADULT MULTIVITAMIN W/MINERALS CH
1.0000 | ORAL_TABLET | Freq: Every day | ORAL | Status: DC
  Administered 2020-03-27 – 2020-04-01 (×6): 1 via ORAL
  Filled 2020-03-27 (×6): qty 1

## 2020-03-27 MED ORDER — MORPHINE SULFATE (PF) 2 MG/ML IV SOLN
1.0000 mg | INTRAVENOUS | Status: DC | PRN
  Administered 2020-03-27: 1 mg via INTRAVENOUS
  Administered 2020-03-27: 2 mg via INTRAVENOUS
  Administered 2020-03-27 – 2020-03-28 (×2): 1 mg via INTRAVENOUS
  Filled 2020-03-27 (×4): qty 1

## 2020-03-27 MED ORDER — ONDANSETRON HCL 4 MG/2ML IJ SOLN
4.0000 mg | Freq: Once | INTRAMUSCULAR | Status: DC
  Filled 2020-03-27: qty 2

## 2020-03-27 MED ORDER — SODIUM CHLORIDE 0.9 % IV SOLN: INTRAVENOUS | Status: AC

## 2020-03-27 MED ORDER — ONDANSETRON HCL 4 MG/2ML IJ SOLN: 4.0000 mg | Freq: Four times a day (QID) | INTRAMUSCULAR | Status: DC | PRN

## 2020-03-27 MED ORDER — AMLODIPINE BESYLATE 10 MG PO TABS
10.0000 mg | ORAL_TABLET | Freq: Every day | ORAL | Status: DC
  Administered 2020-03-27 – 2020-03-29 (×3): 10 mg via ORAL
  Filled 2020-03-27 (×3): qty 1

## 2020-03-27 MED ORDER — DONEPEZIL HCL 10 MG PO TABS
5.0000 mg | ORAL_TABLET | Freq: Every day | ORAL | Status: DC
Start: 2020-03-27 — End: 2020-04-01
  Administered 2020-03-27 – 2020-04-01 (×6): 5 mg via ORAL
  Filled 2020-03-27 (×6): qty 1

## 2020-03-27 MED ORDER — MORPHINE SULFATE (PF) 2 MG/ML IV SOLN
2.0000 mg | Freq: Once | INTRAVENOUS | Status: AC
Start: 2020-03-27 — End: 2020-03-27
  Administered 2020-03-27: 2 mg via INTRAVENOUS

## 2020-03-27 MED ORDER — ONDANSETRON HCL 4 MG/2ML IJ SOLN
4.0000 mg | Freq: Once | INTRAMUSCULAR | Status: AC
  Administered 2020-03-27: 4 mg via INTRAVENOUS

## 2020-03-27 MED ORDER — CARVEDILOL 12.5 MG PO TABS
12.5000 mg | ORAL_TABLET | Freq: Two times a day (BID) | ORAL | Status: DC
  Administered 2020-03-27 – 2020-04-01 (×10): 12.5 mg via ORAL
  Filled 2020-03-27 (×10): qty 1

## 2020-03-27 MED ORDER — PNEUMOCOCCAL VAC POLYVALENT 25 MCG/0.5ML IJ INJ
0.5000 mL | INJECTION | INTRAMUSCULAR | Status: DC
  Filled 2020-03-27: qty 0.5

## 2020-03-27 NOTE — Consult Note (Signed)
Patient ID: [name] MRN: [id number] DOB/AGE: February 19, 1940 80 y.o.  Admit date: 03/26/2020  Admission Diagnoses:  Principal Problem:   Closed fracture of left hip (HCC) Active Problems:   Seizure disorder (HCC)   H/O: CVA (cerebrovascular accident)   Chronic combined systolic and diastolic congestive heart failure (HCC)   Chronic kidney disease, stage 3b (HCC)   Alzheimer's dementia without behavioral disturbance (HCC)   SIRS (systemic inflammatory response syndrome) (HCC)   HPI: [name] is a pleasant 80 year old female with past medical history significant for dementia, chronic kidney disease, CHF, CVA, seizure disorder, he was brought to the emergency department on 2/24 after having an unwitnessed fall from bed onto a wooden floor.  Orthopedics was asked to consult on right hip fracture.  Past Medical History: Past Medical History:  Diagnosis Date  . Abdominal pain 02/24/2013  . Acute combined systolic and diastolic congestive heart failure (HCC) 02/24/2013  . Acute on chronic renal insufficiency 05/15/2015  . Acute renal failure (HCC) 12/28/2012  . Acute renal failure superimposed on stage 3 chronic kidney disease (HCC)   . Anemia 05/20/2015  . 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 Basal ganglia infarction (HCC) 01/08/2013  . CAP (community acquired pneumonia)   . Chest pain 05/14/2015  . Chronic combined systolic and diastolic CHF (congestive heart failure) (HCC) 03/06/2014  . Congestive heart failure (CHF) (HCC)   . Constipation 02/24/2013  . Cough   . Diastolic dysfunction-grade 3 05/20/2015  . Dyspnea 02/24/2013  . Encephalopathy 12/28/2012  . Epilepsy (HCC)   . Generalized seizure (HCC) 12/28/2012  . H/O: CVA (cerebrovascular accident) 01/04/2013  . Hyperammonemia (HCC) 05/17/2015  . Hyperglycemia 02/24/2013  . Hyperkalemia 02/11/2013  . Hypertension   . 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 Hyponatremia 02/11/2013  .  Influenza A 02/11/2013  . Liver enzyme elevation 02/11/2013  . Metabolic acidosis 12/28/2012  . Metabolic encephalopathy 05/16/2015  . NICM (nonischemic cardiomyopathy) -EF 25% 05/20/2015  . Pleuritic chest pain 02/24/2013  . PNA (pneumonia) 05/20/2015  . Pneumobilia 02/12/2013  . Pneumonia   . Seizure (HCC) 12/28/2012  . Seizures (HCC)   . Smoker   . Stroke (HCC)   . Troponin level elevated 05/14/2015  . Volume overload 02/24/2013  . Weight gain 02/24/2013    Surgical History: Past Surgical History:  Procedure Laterality Date  . FEMUR IM NAIL Right 01/05/2020  . INTRAMEDULLARY (IM) NAIL INTERTROCHANTERIC Right 01/05/2020   Procedure: INTRAMEDULLARY (IM) NAIL INTERTROCHANTRIC;  Surgeon: [name], MD;  Location: [facility] OR;  Service: Orthopedics;  Laterality: Right;  . NO PAST SURGERIES      Family History: Family History  Problem Relation Age of Onset  . Cancer Father   . Anemia Daughter     Social History: Social History   Socioeconomic History  . Marital status: Single    Spouse name: Not on file  . Number of children: 3  . Years of education: 53  . Highest education level: Not on file  Occupational History    Comment: Retired  Tobacco Use  . Smoking status: Current Some Day Smoker    Packs/day: 0.25    Types: Cigarettes  . Smokeless tobacco: Never Used  . Tobacco comment: 1/2 caigarettes a day  Vaping Use  . Vaping Use: Never used  Substance and Sexual Activity  . Alcohol use: Yes    Alcohol/week: 0.0 standard drinks    Comment: occasional  . Drug use: No  . Sexual  activity: Not Currently  Other Topics Concern  . Not on file  Social History Narrative   Patient lives at home alone.    Retired.   Education one year of college.   Left handed.   Caffeine one cup of coffee daily and one cup of tea.   Social Determinants of Health   Financial Resource Strain: Not on file  Food Insecurity: Not on file  Transportation Needs: Not on file  Physical Activity: Not  on file  Stress: Not on file  Social Connections: Not on file  Intimate Partner Violence: Not on file    Allergies: Budesonide-formoterol fumarate, Lisinopril, Mirtazapine, and Sodium bicarbonate  Medications: I have reviewed the patient's current medications.  Vital Signs: Patient Vitals for the past 24 hrs:  BP Temp Temp src Pulse Resp SpO2 Height Weight  03/27/20 0630 (!) 179/98 - - (!) 111 17 97 % - -  03/27/20 0530 (!) 173/100 - - (!) 106 14 93 % - -  03/27/20 0430 (!) 160/87 - - (!) 112 18 96 % - -  03/27/20 0400 (!) 167/96 - - (!) 108 19 95 % - -  03/27/20 0330 (!) 171/101 - - (!) 110 12 96 % - -  03/27/20 0300 (!) 159/97 - - (!) 106 20 97 % - -  03/27/20 0230 (!) 152/98 - - (!) 103 18 97 % - -  03/27/20 0200 (!) 161/95 - - (!) 105 17 95 % - -  03/27/20 0130 (!) 163/95 - - (!) 106 18 96 % - -  03/27/20 0115 - - - (!) 109 20 98 % - -  03/27/20 0100 (!) 173/106 - - (!) 109 20 97 % - -  03/27/20 0045 - - - - 20 - - -  03/27/20 0030 - - - (!) 113 19 97 % - -  03/27/20 0015 - - - (!) 112 19 98 % - -  03/27/20 0000 - - - (!) 113 (!) 23 99 % - -  03/26/20 2330 - - - (!) 109 17 95 % - -  03/26/20 2315 - - - 100 (!) 23 96 % - -  03/26/20 2300 (!) 163/85 - - (!) 104 18 97 % - -  03/26/20 2245 - - - (!) 109 20 96 % - -  03/26/20 2236 (!) 149/74 98.1 F (36.7 C) Oral (!) 109 (!) 21 96 % - -  03/26/20 2230 (!) 149/74 - - (!) 105 (!) 22 95 % - -  03/26/20 2229 - - - - - - 5' (1.524 m) 38 kg  03/26/20 2228 - - - - - 95 % - -    Radiology: DG Chest 1 View  Result Date: 03/27/2020 CLINICAL DATA:  Larey Seat from the bed. EXAM: CHEST  1 VIEW COMPARISON:  01/04/2020 FINDINGS: Chronic cardiomegaly and aortic atherosclerosis. Possible pulmonary venous hypertension but no frank edema. No consolidation or collapse. No effusion. No acute fracture identified. Chronic/healing fracture of the right humeral head. IMPRESSION: Cardiomegaly and aortic atherosclerosis. Possible pulmonary venous  hypertension. No frank edema. Electronically Signed   By: Paulina Fusi M.D.   On: 03/27/2020 00:00   CT HEAD WO CONTRAST  Result Date: 03/27/2020 CLINICAL DATA:  Status post fall. EXAM: CT HEAD WITHOUT CONTRAST CT CERVICAL SPINE WITHOUT CONTRAST TECHNIQUE: Multidetector CT imaging of the head and cervical spine was performed following the standard protocol without intravenous contrast. Multiplanar CT image reconstructions of the cervical spine were also generated. COMPARISON:  None. FINDINGS: CT HEAD FINDINGS Brain: There is mild cerebral atrophy with widening of the extra-axial spaces and ventricular dilatation. There are areas of decreased attenuation within the white matter tracts of the supratentorial brain, consistent with microvascular disease changes. Vascular: No hyperdense vessel or unexpected calcification. Skull: Normal. Negative for fracture or focal lesion. Sinuses/Orbits: No acute finding. Other: None. CT CERVICAL SPINE FINDINGS Alignment: There is approximately 1 mm to 2 mm retrolisthesis of the C4 vertebral body on C5. Skull base and vertebrae: No acute fracture. No primary bone lesion or focal pathologic process. Soft tissues and spinal canal: No prevertebral fluid or swelling. No visible canal hematoma. Disc levels: Mild to moderate severity endplate sclerosis is seen at the levels of C2-C3, and C4-C5. Mild anterior osteophyte formation is present at the levels of C3-C4 and C5-C6. Moderate severity intervertebral disc space narrowing is seen at the levels of C2-C3, C4-C5 and C5-C6. Normal bilateral multilevel facet joints are noted. Upper chest: Negative. Other: N/A IMPRESSION: 1. Mild cerebral atrophy and microvascular disease changes of the supratentorial brain. 2. No evidence of an acute fracture or subluxation of the cervical spine. 3. Moderate severity degenerative changes of the cervical spine, as described above. Electronically Signed   By: Aram Candela M.D.   On: 03/27/2020 00:16    CT HEAD WO CONTRAST  Result Date: 03/25/2020 CLINICAL DATA:  Mental status change EXAM: CT HEAD WITHOUT CONTRAST TECHNIQUE: Contiguous axial images were obtained from the base of the skull through the vertex without intravenous contrast. COMPARISON:  03/22/2020 FINDINGS: Brain: There is no acute intracranial hemorrhage, mass effect, or edema. No new loss of gray-white differentiation. There is no extra-axial fluid collection. Chronic parasagittal left frontoparietal infarct. Confluent areas of hypoattenuation in the supratentorial white matter likely reflecting stable chronic microvascular ischemic changes. Ventricles and sulci are stable in size and configuration. Vascular: No hyperdense vessel.There is atherosclerotic calcification at the skull base. Skull: Calvarium is unremarkable. Sinuses/Orbits: No acute finding. Other: None. IMPRESSION: No acute intracranial abnormality. Stable chronic/nonemergent findings detailed above. Electronically Signed   By: Guadlupe Spanish M.D.   On: 03/25/2020 14:52   CT HEAD WO CONTRAST  Result Date: 03/22/2020 CLINICAL DATA:  Transient ischemic attack. EXAM: CT HEAD WITHOUT CONTRAST TECHNIQUE: Contiguous axial images were obtained from the base of the skull through the vertex without intravenous contrast. COMPARISON:  January 04, 2020 FINDINGS: Brain: There is mild to moderate severity cerebral atrophy with widening of the extra-axial spaces and ventricular dilatation. There are areas of decreased attenuation within the white matter tracts of the supratentorial brain, consistent with microvascular disease changes. Vascular: No hyperdense vessel or unexpected calcification. Skull: Normal. Negative for fracture or focal lesion. Sinuses/Orbits: No acute finding. Other: None. IMPRESSION: 1. Generalized cerebral atrophy. 2. No acute intracranial abnormality. Electronically Signed   By: Aram Candela M.D.   On: 03/22/2020 01:06   CT CERVICAL SPINE WO CONTRAST  Result  Date: 03/27/2020 CLINICAL DATA:  Status post fall. EXAM: CT HEAD WITHOUT CONTRAST CT CERVICAL SPINE WITHOUT CONTRAST TECHNIQUE: Multidetector CT imaging of the head and cervical spine was performed following the standard protocol without intravenous contrast. Multiplanar CT image reconstructions of the cervical spine were also generated. COMPARISON:  None. FINDINGS: CT HEAD FINDINGS Brain: There is mild cerebral atrophy with widening of the extra-axial spaces and ventricular dilatation. There are areas of decreased attenuation within the white matter tracts of the supratentorial brain, consistent with microvascular disease changes. Vascular: No hyperdense vessel or unexpected calcification. Skull: Normal.  Negative for fracture or focal lesion. Sinuses/Orbits: No acute finding. Other: None. CT CERVICAL SPINE FINDINGS Alignment: There is approximately 1 mm to 2 mm retrolisthesis of the C4 vertebral body on C5. Skull base and vertebrae: No acute fracture. No primary bone lesion or focal pathologic process. Soft tissues and spinal canal: No prevertebral fluid or swelling. No visible canal hematoma. Disc levels: Mild to moderate severity endplate sclerosis is seen at the levels of C2-C3, and C4-C5. Mild anterior osteophyte formation is present at the levels of C3-C4 and C5-C6. Moderate severity intervertebral disc space narrowing is seen at the levels of C2-C3, C4-C5 and C5-C6. Normal bilateral multilevel facet joints are noted. Upper chest: Negative. Other: N/A IMPRESSION: 1. Mild cerebral atrophy and microvascular disease changes of the supratentorial brain. 2. No evidence of an acute fracture or subluxation of the cervical spine. 3. Moderate severity degenerative changes of the cervical spine, as described above. Electronically Signed   By: Aram Candela M.D.   On: 03/27/2020 00:16   DG Knee Complete 4 Views Left  Result Date: 03/27/2020 CLINICAL DATA:  Status post fall. EXAM: LEFT KNEE - COMPLETE 4+ VIEW  COMPARISON:  None. FINDINGS: No evidence of fracture, dislocation, or joint effusion. No evidence of arthropathy or other focal bone abnormality. Soft tissues are unremarkable. IMPRESSION: Negative. Electronically Signed   By: Aram Candela M.D.   On: 03/27/2020 00:04   DG Hip Unilat W or Wo Pelvis 2-3 Views Left  Result Date: 03/27/2020 CLINICAL DATA:  Status post fall. EXAM: DG HIP (WITH OR WITHOUT PELVIS) 2-3V LEFT COMPARISON:  None. FINDINGS: Acute fracture deformity is seen extending through the neck of the proximal left femur. Approximately 1 shaft with superior displacement of the distal fracture site is noted. A radiopaque intramedullary rod and compression screw device are seen within the proximal right femur. There is no evidence of dislocation. There is no evidence of arthropathy or other focal bone abnormality. IMPRESSION: Acute fracture of the proximal left femur. Electronically Signed   By: Aram Candela M.D.   On: 03/27/2020 00:04    Labs: Recent Labs    03/27/20 0033 03/27/20 0532  WBC 16.0* 12.4*  RBC 3.86* 3.62*  HCT 37.5 35.6*  PLT 311 280   Recent Labs    03/27/20 0033 03/27/20 0532  NA 138 137  K 5.1 4.3  CL 108 106  CO2 19* 20*  BUN 27* 25*  CREATININE 1.52* 1.35*  GLUCOSE 147* 128*  CALCIUM 9.8 9.5   Recent Labs    03/25/20 1450 03/27/20 0033  INR 0.9 0.9    Review of Systems: Review of Systems  Unable to perform ROS: Dementia    Physical Exam: Body mass index is 16.36 kg/m.  General: She is alert, not oriented given baseline dementia.  Resting comfortably with right hip externally rotated and abducted.   AROM: Isolated knee and ankle range of motion on the right is within normal limits and pain-free.  This was  Strength: 5/5 dorsiflexion and plantar flexion bilaterally  Sensation: Distal sensation to light touch intact bilaterally  PV: No pitting edema, palpable distal pulses, cap refill less than 2 seconds bilaterally.  X-ray  impression: Acute fracture of left femur   Assessment and Plan: Acute left femur fracture  Images reviewed with Dr. Linna Caprice who is recommending surgical intervention.  We will plan on moving forward with surgery on Sunday 03/29/2020.  Pain and medical optimization per primary team.   NPO after midnight Saturday into Sunday.  [facility] Ward PA-C [facility]

## 2020-03-27 NOTE — Evaluation (Signed)
Clinical/Bedside Swallow Evaluation Patient Details  Name: [name] MRN: [id number] Date of Birth: January 28, 1941  Today's Date: 03/27/2020 Time: SLP Start Time (ACUTE ONLY): 1338 SLP Stop Time (ACUTE ONLY): 1346 SLP Time Calculation (min) (ACUTE ONLY): 8 min  Past Medical History:  Past Medical History:  Diagnosis Date  . Abdominal pain 02/24/2013  . Acute combined systolic and diastolic congestive heart failure (HCC) 02/24/2013  . Acute on chronic renal insufficiency 05/15/2015  . Acute renal failure (HCC) 12/28/2012  . Acute renal failure superimposed on stage 3 chronic kidney disease (HCC)   . Anemia 05/20/2015  . 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 Basal ganglia infarction (HCC) 01/08/2013  . CAP (community acquired pneumonia)   . Chest pain 05/14/2015  . Chronic combined systolic and diastolic CHF (congestive heart failure) (HCC) 03/06/2014  . Congestive heart failure (CHF) (HCC)   . Constipation 02/24/2013  . Cough   . Diastolic dysfunction-grade 3 05/20/2015  . Dyspnea 02/24/2013  . Encephalopathy 12/28/2012  . Epilepsy (HCC)   . Generalized seizure (HCC) 12/28/2012  . H/O: CVA (cerebrovascular accident) 01/04/2013  . Hyperammonemia (HCC) 05/17/2015  . Hyperglycemia 02/24/2013  . Hyperkalemia 02/11/2013  . Hypertension   . 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 Hyponatremia 02/11/2013  . Influenza A 02/11/2013  . Liver enzyme elevation 02/11/2013  . Metabolic acidosis 12/28/2012  . Metabolic encephalopathy 05/16/2015  . NICM (nonischemic cardiomyopathy) -EF 25% 05/20/2015  . Pleuritic chest pain 02/24/2013  . PNA (pneumonia) 05/20/2015  . Pneumobilia 02/12/2013  . Pneumonia   . Seizure (HCC) 12/28/2012  . Seizures (HCC)   . Smoker   . Stroke (HCC)   . Troponin level elevated 05/14/2015  . Volume overload 02/24/2013  . Weight gain 02/24/2013   Past Surgical History:  Past Surgical History:  Procedure Laterality Date  . FEMUR IM NAIL  Right 01/05/2020  . INTRAMEDULLARY (IM) NAIL INTERTROCHANTERIC Right 01/05/2020   Procedure: INTRAMEDULLARY (IM) NAIL INTERTROCHANTRIC;  Surgeon: [name], MD;  Location: [facility] OR;  Service: Orthopedics;  Laterality: Right;  . NO PAST SURGERIES     HPI:  The patient is a 80 year old extremely thin African-American female with a past medical history significant for but not limited to dementia, chronic combined systolic and diastolic CHF, nonobstructive CAD, CKD stage IIIb, history of a CVA, seizure disorder, hypertension as well as other comorbidities presented to the emergency room for evaluation after a fall.   CT of the head demonstrated mild cerebral atrophy and changes of microvascular disease without acute findings.  Radiographs of the hip and pelvis demonstrate acute proximal left femur fracture.   Assessment / Plan / Recommendation Clinical Impression  Pt again demonstrates adequate swallowing ability clinically with no signs of aspiration. Pt reports only eating two meals a day, did not have an appetite today and only accepted minimal trials. No apparent dysphagia and pt denies symptoms associated with GERD, but malnutrition appears to be a problem. No SLP f/u needed will sign off. SLP Visit Diagnosis: Dysphagia, unspecified (R13.10)    Aspiration Risk  Risk for inadequate nutrition/hydration    Diet Recommendation Thin liquid;Regular   Liquid Administration via: Cup;Straw Medication Administration: Whole meds with liquid Supervision: Patient able to self feed    Other  Recommendations Oral Care Recommendations: Oral care BID   Follow up Recommendations        Frequency and Duration            Prognosis  Swallow Study   General HPI: The patient is a 80 year old extremely thin African-American female with a past medical history significant for but not limited to dementia, chronic combined systolic and diastolic CHF, nonobstructive CAD, CKD stage IIIb, history of a  CVA, seizure disorder, hypertension as well as other comorbidities presented to the emergency room for evaluation after a fall.   CT of the head demonstrated mild cerebral atrophy and changes of microvascular disease without acute findings.  Radiographs of the hip and pelvis demonstrate acute proximal left femur fracture. Type of Study: Bedside Swallow Evaluation Previous Swallow Assessment: several prior BSE in 2021. regular/thin Diet Prior to this Study: Dysphagia 3 (soft);Thin liquids Temperature Spikes Noted: No Respiratory Status: Room air History of Recent Intubation: No Behavior/Cognition: Alert;Cooperative;Pleasant mood Oral Cavity Assessment: Within Functional Limits Oral Care Completed by SLP: No Oral Cavity - Dentition: Dentures, top;Dentures, bottom Vision: Functional for self-feeding Self-Feeding Abilities: Able to feed self Patient Positioning: Upright in bed Baseline Vocal Quality: Normal Volitional Cough: Strong Volitional Swallow: Able to elicit    Oral/Motor/Sensory Function Overall Oral Motor/Sensory Function: Within functional limits   Ice Chips     Thin Liquid Thin Liquid: Within functional limits Presentation: Straw;Self Fed    Nectar Thick Nectar Thick Liquid: Not tested   Honey Thick Honey Thick Liquid: Not tested   Puree Puree: Within functional limits Presentation: Spoon;Self Fed   Solid     Solid: Within functional limits Presentation: Self Fed     Harlon Ditty, MA CCC-SLP  Acute Rehabilitation Services Pager [phone] Office [phone]  Dyanne Iha, Riley Nearing 03/27/2020,1:49 PM

## 2020-03-27 NOTE — Progress Notes (Signed)
Initial Nutrition Assessment  DOCUMENTATION CODES:   Underweight,Severe malnutrition in context of chronic illness  INTERVENTION:   -Ensure Surgery po BID, each supplement provides 330 kcal and 18 grams of protein  -Multivitamin with minerals daily  NUTRITION DIAGNOSIS:   Severe Malnutrition related to chronic illness (dementia) as evidenced by moderate fat depletion,severe muscle depletion.  GOAL:   Patient will meet greater than or equal to 90% of their needs  MONITOR:   PO intake,Supplement acceptance,Labs,Weight trends,I & O's  REASON FOR ASSESSMENT:   Consult Hip fracture protocol  ASSESSMENT:   80 y.o. female with medical history significant for dementia, chronic combined systolic and diastolic CHF, nonobstructive CAD, CKD 3b, history of CVA, seizure disorder, and hypertension, now presenting to the emergency department for evaluation of fall. Admitted for left hip fracture.  Patient in room with no family at bedside. Pt provided minimal history, was able to shake head yes or no to some questions.  Pt with history of Alzheimer's dementia.   Per ortho note, plan is for surgery on 2/27 for left hip fracture. Will order Ensure supplements while on diet.  Per weight records, pt's weight is stable. Pt is underweight with history of severe malnutrition which continues.  Labs reviewed. Medications reviewed.   NUTRITION - FOCUSED PHYSICAL EXAM:  Flowsheet Row Most Recent Value  Orbital Region Mild depletion  Upper Arm Region Moderate depletion  Thoracic and Lumbar Region Unable to assess  Buccal Region Moderate depletion  Temple Region Moderate depletion  Clavicle Bone Region Severe depletion  Clavicle and Acromion Bone Region Severe depletion  Scapular Bone Region Moderate depletion  Dorsal Hand Moderate depletion  Patellar Region Severe depletion  Anterior Thigh Region Severe depletion  Posterior Calf Region Severe depletion  Edema (RD Assessment) None  Eyes  Reviewed  Mouth Reviewed  Skin Reviewed  [dry]       Diet Order:   Diet Order            DIET SOFT Room service appropriate? Yes; Fluid consistency: Thin  Diet effective now                 EDUCATION NEEDS:   No education needs have been identified at this time  Skin:  Skin Assessment: Reviewed RN Assessment  Last BM:  PTA  Height:   Ht Readings from Last 1 Encounters:  03/26/20 5' (1.524 m)    Weight:   Wt Readings from Last 1 Encounters:  03/26/20 38 kg   BMI:  Body mass index is 16.36 kg/m.  Estimated Nutritional Needs:   Kcal:  1250-1450  Protein:  60-70g  Fluid:  1.5L/day  Tilda Franco, MS, RD, LDN Inpatient Clinical Dietitian Contact information available via Amion

## 2020-03-27 NOTE — ED Notes (Signed)
Extra tubes sent to lab:  2 Gold Tubes

## 2020-03-27 NOTE — H&P (View-Only) (Signed)
Patient ID: [name] MRN: [id number] DOB/AGE: February 19, 1940 80 y.o.  Admit date: 03/26/2020  Admission Diagnoses:  Principal Problem:   Closed fracture of left hip (HCC) Active Problems:   Seizure disorder (HCC)   H/O: CVA (cerebrovascular accident)   Chronic combined systolic and diastolic congestive heart failure (HCC)   Chronic kidney disease, stage 3b (HCC)   Alzheimer's dementia without behavioral disturbance (HCC)   SIRS (systemic inflammatory response syndrome) (HCC)   HPI: [name] is a pleasant 80 year old female with past medical history significant for dementia, chronic kidney disease, CHF, CVA, seizure disorder, he was brought to the emergency department on 2/24 after having an unwitnessed fall from bed onto a wooden floor.  Orthopedics was asked to consult on right hip fracture.  Past Medical History: Past Medical History:  Diagnosis Date  . Abdominal pain 02/24/2013  . Acute combined systolic and diastolic congestive heart failure (HCC) 02/24/2013  . Acute on chronic renal insufficiency 05/15/2015  . Acute renal failure (HCC) 12/28/2012  . Acute renal failure superimposed on stage 3 chronic kidney disease (HCC)   . Anemia 05/20/2015  . 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 Basal ganglia infarction (HCC) 01/08/2013  . CAP (community acquired pneumonia)   . Chest pain 05/14/2015  . Chronic combined systolic and diastolic CHF (congestive heart failure) (HCC) 03/06/2014  . Congestive heart failure (CHF) (HCC)   . Constipation 02/24/2013  . Cough   . Diastolic dysfunction-grade 3 05/20/2015  . Dyspnea 02/24/2013  . Encephalopathy 12/28/2012  . Epilepsy (HCC)   . Generalized seizure (HCC) 12/28/2012  . H/O: CVA (cerebrovascular accident) 01/04/2013  . Hyperammonemia (HCC) 05/17/2015  . Hyperglycemia 02/24/2013  . Hyperkalemia 02/11/2013  . Hypertension   . 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 Hyponatremia 02/11/2013  .  Influenza A 02/11/2013  . Liver enzyme elevation 02/11/2013  . Metabolic acidosis 12/28/2012  . Metabolic encephalopathy 05/16/2015  . NICM (nonischemic cardiomyopathy) -EF 25% 05/20/2015  . Pleuritic chest pain 02/24/2013  . PNA (pneumonia) 05/20/2015  . Pneumobilia 02/12/2013  . Pneumonia   . Seizure (HCC) 12/28/2012  . Seizures (HCC)   . Smoker   . Stroke (HCC)   . Troponin level elevated 05/14/2015  . Volume overload 02/24/2013  . Weight gain 02/24/2013    Surgical History: Past Surgical History:  Procedure Laterality Date  . FEMUR IM NAIL Right 01/05/2020  . INTRAMEDULLARY (IM) NAIL INTERTROCHANTERIC Right 01/05/2020   Procedure: INTRAMEDULLARY (IM) NAIL INTERTROCHANTRIC;  Surgeon: [name], MD;  Location: [facility] OR;  Service: Orthopedics;  Laterality: Right;  . NO PAST SURGERIES      Family History: Family History  Problem Relation Age of Onset  . Cancer Father   . Anemia Daughter     Social History: Social History   Socioeconomic History  . Marital status: Single    Spouse name: Not on file  . Number of children: 3  . Years of education: 53  . Highest education level: Not on file  Occupational History    Comment: Retired  Tobacco Use  . Smoking status: Current Some Day Smoker    Packs/day: 0.25    Types: Cigarettes  . Smokeless tobacco: Never Used  . Tobacco comment: 1/2 caigarettes a day  Vaping Use  . Vaping Use: Never used  Substance and Sexual Activity  . Alcohol use: Yes    Alcohol/week: 0.0 standard drinks    Comment: occasional  . Drug use: No  . Sexual  activity: Not Currently  Other Topics Concern  . Not on file  Social History Narrative   Patient lives at home alone.    Retired.   Education one year of college.   Left handed.   Caffeine one cup of coffee daily and one cup of tea.   Social Determinants of Health   Financial Resource Strain: Not on file  Food Insecurity: Not on file  Transportation Needs: Not on file  Physical Activity: Not  on file  Stress: Not on file  Social Connections: Not on file  Intimate Partner Violence: Not on file    Allergies: Budesonide-formoterol fumarate, Lisinopril, Mirtazapine, and Sodium bicarbonate  Medications: I have reviewed the patient's current medications.  Vital Signs: Patient Vitals for the past 24 hrs:  BP Temp Temp src Pulse Resp SpO2 Height Weight  03/27/20 0630 (!) 179/98 - - (!) 111 17 97 % - -  03/27/20 0530 (!) 173/100 - - (!) 106 14 93 % - -  03/27/20 0430 (!) 160/87 - - (!) 112 18 96 % - -  03/27/20 0400 (!) 167/96 - - (!) 108 19 95 % - -  03/27/20 0330 (!) 171/101 - - (!) 110 12 96 % - -  03/27/20 0300 (!) 159/97 - - (!) 106 20 97 % - -  03/27/20 0230 (!) 152/98 - - (!) 103 18 97 % - -  03/27/20 0200 (!) 161/95 - - (!) 105 17 95 % - -  03/27/20 0130 (!) 163/95 - - (!) 106 18 96 % - -  03/27/20 0115 - - - (!) 109 20 98 % - -  03/27/20 0100 (!) 173/106 - - (!) 109 20 97 % - -  03/27/20 0045 - - - - 20 - - -  03/27/20 0030 - - - (!) 113 19 97 % - -  03/27/20 0015 - - - (!) 112 19 98 % - -  03/27/20 0000 - - - (!) 113 (!) 23 99 % - -  03/26/20 2330 - - - (!) 109 17 95 % - -  03/26/20 2315 - - - 100 (!) 23 96 % - -  03/26/20 2300 (!) 163/85 - - (!) 104 18 97 % - -  03/26/20 2245 - - - (!) 109 20 96 % - -  03/26/20 2236 (!) 149/74 98.1 F (36.7 C) Oral (!) 109 (!) 21 96 % - -  03/26/20 2230 (!) 149/74 - - (!) 105 (!) 22 95 % - -  03/26/20 2229 - - - - - - 5' (1.524 m) 38 kg  03/26/20 2228 - - - - - 95 % - -    Radiology: DG Chest 1 View  Result Date: 03/27/2020 CLINICAL DATA:  Fell from the bed. EXAM: CHEST  1 VIEW COMPARISON:  01/04/2020 FINDINGS: Chronic cardiomegaly and aortic atherosclerosis. Possible pulmonary venous hypertension but no frank edema. No consolidation or collapse. No effusion. No acute fracture identified. Chronic/healing fracture of the right humeral head. IMPRESSION: Cardiomegaly and aortic atherosclerosis. Possible pulmonary venous  hypertension. No frank edema. Electronically Signed   By: Mark  Shogry M.D.   On: 03/27/2020 00:00   CT HEAD WO CONTRAST  Result Date: 03/27/2020 CLINICAL DATA:  Status post fall. EXAM: CT HEAD WITHOUT CONTRAST CT CERVICAL SPINE WITHOUT CONTRAST TECHNIQUE: Multidetector CT imaging of the head and cervical spine was performed following the standard protocol without intravenous contrast. Multiplanar CT image reconstructions of the cervical spine were also generated. COMPARISON:    None. FINDINGS: CT HEAD FINDINGS Brain: There is mild cerebral atrophy with widening of the extra-axial spaces and ventricular dilatation. There are areas of decreased attenuation within the white matter tracts of the supratentorial brain, consistent with microvascular disease changes. Vascular: No hyperdense vessel or unexpected calcification. Skull: Normal. Negative for fracture or focal lesion. Sinuses/Orbits: No acute finding. Other: None. CT CERVICAL SPINE FINDINGS Alignment: There is approximately 1 mm to 2 mm retrolisthesis of the C4 vertebral body on C5. Skull base and vertebrae: No acute fracture. No primary bone lesion or focal pathologic process. Soft tissues and spinal canal: No prevertebral fluid or swelling. No visible canal hematoma. Disc levels: Mild to moderate severity endplate sclerosis is seen at the levels of C2-C3, and C4-C5. Mild anterior osteophyte formation is present at the levels of C3-C4 and C5-C6. Moderate severity intervertebral disc space narrowing is seen at the levels of C2-C3, C4-C5 and C5-C6. Normal bilateral multilevel facet joints are noted. Upper chest: Negative. Other: N/A IMPRESSION: 1. Mild cerebral atrophy and microvascular disease changes of the supratentorial brain. 2. No evidence of an acute fracture or subluxation of the cervical spine. 3. Moderate severity degenerative changes of the cervical spine, as described above. Electronically Signed   By: Thaddeus  Houston M.D.   On: 03/27/2020 00:16    CT HEAD WO CONTRAST  Result Date: 03/25/2020 CLINICAL DATA:  Mental status change EXAM: CT HEAD WITHOUT CONTRAST TECHNIQUE: Contiguous axial images were obtained from the base of the skull through the vertex without intravenous contrast. COMPARISON:  03/22/2020 FINDINGS: Brain: There is no acute intracranial hemorrhage, mass effect, or edema. No new loss of gray-white differentiation. There is no extra-axial fluid collection. Chronic parasagittal left frontoparietal infarct. Confluent areas of hypoattenuation in the supratentorial white matter likely reflecting stable chronic microvascular ischemic changes. Ventricles and sulci are stable in size and configuration. Vascular: No hyperdense vessel.There is atherosclerotic calcification at the skull base. Skull: Calvarium is unremarkable. Sinuses/Orbits: No acute finding. Other: None. IMPRESSION: No acute intracranial abnormality. Stable chronic/nonemergent findings detailed above. Electronically Signed   By: Praneil  Patel M.D.   On: 03/25/2020 14:52   CT HEAD WO CONTRAST  Result Date: 03/22/2020 CLINICAL DATA:  Transient ischemic attack. EXAM: CT HEAD WITHOUT CONTRAST TECHNIQUE: Contiguous axial images were obtained from the base of the skull through the vertex without intravenous contrast. COMPARISON:  January 04, 2020 FINDINGS: Brain: There is mild to moderate severity cerebral atrophy with widening of the extra-axial spaces and ventricular dilatation. There are areas of decreased attenuation within the white matter tracts of the supratentorial brain, consistent with microvascular disease changes. Vascular: No hyperdense vessel or unexpected calcification. Skull: Normal. Negative for fracture or focal lesion. Sinuses/Orbits: No acute finding. Other: None. IMPRESSION: 1. Generalized cerebral atrophy. 2. No acute intracranial abnormality. Electronically Signed   By: Thaddeus  Houston M.D.   On: 03/22/2020 01:06   CT CERVICAL SPINE WO CONTRAST  Result  Date: 03/27/2020 CLINICAL DATA:  Status post fall. EXAM: CT HEAD WITHOUT CONTRAST CT CERVICAL SPINE WITHOUT CONTRAST TECHNIQUE: Multidetector CT imaging of the head and cervical spine was performed following the standard protocol without intravenous contrast. Multiplanar CT image reconstructions of the cervical spine were also generated. COMPARISON:  None. FINDINGS: CT HEAD FINDINGS Brain: There is mild cerebral atrophy with widening of the extra-axial spaces and ventricular dilatation. There are areas of decreased attenuation within the white matter tracts of the supratentorial brain, consistent with microvascular disease changes. Vascular: No hyperdense vessel or unexpected calcification. Skull: Normal.   Negative for fracture or focal lesion. Sinuses/Orbits: No acute finding. Other: None. CT CERVICAL SPINE FINDINGS Alignment: There is approximately 1 mm to 2 mm retrolisthesis of the C4 vertebral body on C5. Skull base and vertebrae: No acute fracture. No primary bone lesion or focal pathologic process. Soft tissues and spinal canal: No prevertebral fluid or swelling. No visible canal hematoma. Disc levels: Mild to moderate severity endplate sclerosis is seen at the levels of C2-C3, and C4-C5. Mild anterior osteophyte formation is present at the levels of C3-C4 and C5-C6. Moderate severity intervertebral disc space narrowing is seen at the levels of C2-C3, C4-C5 and C5-C6. Normal bilateral multilevel facet joints are noted. Upper chest: Negative. Other: N/A IMPRESSION: 1. Mild cerebral atrophy and microvascular disease changes of the supratentorial brain. 2. No evidence of an acute fracture or subluxation of the cervical spine. 3. Moderate severity degenerative changes of the cervical spine, as described above. Electronically Signed   By: Thaddeus  Houston M.D.   On: 03/27/2020 00:16   DG Knee Complete 4 Views Left  Result Date: 03/27/2020 CLINICAL DATA:  Status post fall. EXAM: LEFT KNEE - COMPLETE 4+ VIEW  COMPARISON:  None. FINDINGS: No evidence of fracture, dislocation, or joint effusion. No evidence of arthropathy or other focal bone abnormality. Soft tissues are unremarkable. IMPRESSION: Negative. Electronically Signed   By: Thaddeus  Houston M.D.   On: 03/27/2020 00:04   DG Hip Unilat W or Wo Pelvis 2-3 Views Left  Result Date: 03/27/2020 CLINICAL DATA:  Status post fall. EXAM: DG HIP (WITH OR WITHOUT PELVIS) 2-3V LEFT COMPARISON:  None. FINDINGS: Acute fracture deformity is seen extending through the neck of the proximal left femur. Approximately 1 shaft with superior displacement of the distal fracture site is noted. A radiopaque intramedullary rod and compression screw device are seen within the proximal right femur. There is no evidence of dislocation. There is no evidence of arthropathy or other focal bone abnormality. IMPRESSION: Acute fracture of the proximal left femur. Electronically Signed   By: Thaddeus  Houston M.D.   On: 03/27/2020 00:04    Labs: Recent Labs    03/27/20 0033 03/27/20 0532  WBC 16.0* 12.4*  RBC 3.86* 3.62*  HCT 37.5 35.6*  PLT 311 280   Recent Labs    03/27/20 0033 03/27/20 0532  NA 138 137  K 5.1 4.3  CL 108 106  CO2 19* 20*  BUN 27* 25*  CREATININE 1.52* 1.35*  GLUCOSE 147* 128*  CALCIUM 9.8 9.5   Recent Labs    03/25/20 1450 03/27/20 0033  INR 0.9 0.9    Review of Systems: Review of Systems  Unable to perform ROS: Dementia    Physical Exam: Body mass index is 16.36 kg/m.  General: She is alert, not oriented given baseline dementia.  Resting comfortably with right hip externally rotated and abducted.   AROM: Isolated knee and ankle range of motion on the right is within normal limits and pain-free.  This was  Strength: 5/5 dorsiflexion and plantar flexion bilaterally  Sensation: Distal sensation to light touch intact bilaterally  PV: No pitting edema, palpable distal pulses, cap refill less than 2 seconds bilaterally.  X-ray  impression: Acute fracture of left femur   Assessment and Plan: Acute left femur fracture  Images reviewed with Dr. Swinteck who is recommending surgical intervention.  We will plan on moving forward with surgery on Sunday 03/29/2020.  Pain and medical optimization per primary team.   NPO after midnight Saturday into Sunday.      Ilario Dhaliwal PA-C [facility] 

## 2020-03-27 NOTE — Progress Notes (Signed)
No seizure pads available at this time.

## 2020-03-27 NOTE — H&P (Signed)
History and Physical    Sol Odor UJW:[id number] DOB: 08-11-1940 DOA: 03/26/2020  PCP: [name], MD   Patient coming from: Home  Chief Complaint: Fall   HPI: [name] is a 80 y.o. female with medical history significant for dementia, chronic combined systolic and diastolic CHF, nonobstructive CAD, CKD 3b, history of CVA, seizure disorder, and hypertension, now presenting to the emergency department for evaluation of fall. Patient was found on the floor beside her bed, is at her baseline cognitively per family but is unable to recall what happened. She has been denying pain but appears to be in pain when her left leg is moved. She was noted to have a deformity involving the left lower extremity. She has been living with her son recently and was seen in the emergency department 2 days ago for a transient speech disturbance but had day head CT with no acute findings, returned to her baseline, and returned home.  ED Course: Upon arrival to the ED, patient is found to be afebrile, saturating well on room air, slightly tachycardic, and with blood pressure 170/100. EKG demonstrates sinus tachycardia with rate 103 and LBBB. Chest x-ray notable for cardiomegaly, question pulmonary venous hypertension, but no frank edema. Radiographs of the hip and pelvis demonstrate acute proximal left femur fracture. Plain films of the left knee are negative. No acute cervical spine fracture or subluxation is noted on CT. CT head demonstrates mild cerebral atrophy and changes of microvascular disease without acute findings. Chemistry panel notable for bicarbonate of 19 and creatinine 1.52, up from an apparent baseline closer to 1.4. CBC is unremarkable. COVID-19 screening test is pending. Patient was treated with morphine and Zofran in the emergency department and orthopedic surgery was consulted by the ED physician.  Review of Systems:  Unable to complete ROS secondary the patient's clinical  condition.  Past Medical History:  Diagnosis Date  . Abdominal pain 02/24/2013  . Acute combined systolic and diastolic congestive heart failure (HCC) 02/24/2013  . Acute on chronic renal insufficiency 05/15/2015  . Acute renal failure (HCC) 12/28/2012  . Acute renal failure superimposed on stage 3 chronic kidney disease (HCC)   . Anemia 05/20/2015  . 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 Basal ganglia infarction (HCC) 01/08/2013  . CAP (community acquired pneumonia)   . Chest pain 05/14/2015  . Chronic combined systolic and diastolic CHF (congestive heart failure) (HCC) 03/06/2014  . Congestive heart failure (CHF) (HCC)   . Constipation 02/24/2013  . Cough   . Diastolic dysfunction-grade 3 05/20/2015  . Dyspnea 02/24/2013  . Encephalopathy 12/28/2012  . Epilepsy (HCC)   . Generalized seizure (HCC) 12/28/2012  . H/O: CVA (cerebrovascular accident) 01/04/2013  . Hyperammonemia (HCC) 05/17/2015  . Hyperglycemia 02/24/2013  . Hyperkalemia 02/11/2013  . Hypertension   . 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 Hyponatremia 02/11/2013  . Influenza A 02/11/2013  . Liver enzyme elevation 02/11/2013  . Metabolic acidosis 12/28/2012  . Metabolic encephalopathy 05/16/2015  . NICM (nonischemic cardiomyopathy) -EF 25% 05/20/2015  . Pleuritic chest pain 02/24/2013  . PNA (pneumonia) 05/20/2015  . Pneumobilia 02/12/2013  . Pneumonia   . Seizure (HCC) 12/28/2012  . Seizures (HCC)   . Smoker   . Stroke (HCC)   . Troponin level elevated 05/14/2015  . Volume overload 02/24/2013  . Weight gain 02/24/2013    Past Surgical History:  Procedure Laterality Date  . FEMUR IM NAIL Right 01/05/2020  . INTRAMEDULLARY (IM) NAIL INTERTROCHANTERIC Right  01/05/2020   Procedure: INTRAMEDULLARY (IM) NAIL INTERTROCHANTRIC;  Surgeon: Bjorn PippinVarkey, Dax T, MD;  Location: [facility] OR;  Service: Orthopedics;  Laterality: Right;  . NO PAST SURGERIES      Social History:   reports that she has been smoking  cigarettes. She has been smoking about 0.25 packs per day. She has never used smokeless tobacco. She reports current alcohol use. She reports that she does not use drugs.  Allergies  Allergen Reactions  . Budesonide-Formoterol Fumarate     Other reaction(s): Sore throat  . Lisinopril     Other reaction(s): nausea  . Mirtazapine     Other reaction(s): Severe Headaches  . Sodium Bicarbonate     Other reaction(s): Makes her vomit    Family History  Problem Relation Age of Onset  . Cancer Father   . Anemia Daughter      Prior to Admission medications   Medication Sig Start Date End Date Taking? Authorizing Provider  acetaminophen (TYLENOL) 500 MG tablet Take 500 mg by mouth every 6 (six) hours as needed for moderate pain.    [provider]  allopurinol (ZYLOPRIM) 100 MG tablet Take 100 mg by mouth daily.    [provider]  amLODipine (NORVASC) 10 MG tablet Take 10 mg by mouth daily. 07/19/19   [provider]  calcium carbonate (OSCAL) 1500 (600 Ca) MG TABS tablet Take 1,500 mg by mouth every evening.  07/26/19   [provider]  carvedilol (COREG) 12.5 MG tablet Take 12.5 mg by mouth 2 (two) times daily with a meal.    [provider]  docusate sodium (COLACE) 100 MG capsule Take 1 capsule (100 mg total) by mouth 2 (two) times daily. Patient taking differently: Take 100 mg by mouth daily as needed for mild constipation. 01/09/20   Pahwani, Kasandra Knudseninka R, MD  donepezil (ARICEPT) 5 MG tablet Take 5 mg by mouth daily. 07/02/19   [provider]  Ensure (ENSURE) Take 237 mLs by mouth 3 (three) times daily between meals.    [provider]  FEROSUL 325 (65 Fe) MG tablet Take 325 mg by mouth every morning. 07/26/19   [provider]  heparin 5000 UNIT/ML injection Inject 1 mL (5,000 Units total) into the skin every 12 (twelve) hours for 14 days. For DVT prophylaxis after surgery Patient not taking: Reported on 03/25/2020 01/09/20  01/23/20  Vernetta HoneyMcBane, Caroline N, PA-C  HYDROcodone-acetaminophen (NORCO) 5-325 MG tablet Take 1 tablet by mouth every 6 (six) hours as needed for moderate pain. MAXIMUM TOTAL ACETAMINOPHEN DOSE IS 4000 MG PER DAY Patient not taking: No sig reported 01/08/20   Vernetta HoneyMcBane, Caroline N, PA-C  lacosamide (VIMPAT) 200 MG TABS tablet Take 1 tablet (200 mg total) by mouth 2 (two) times daily. Patient not taking: No sig reported 02/13/20   Butch PennyMillikan, Megan, NP  lacosamide (VIMPAT) 200 MG TABS tablet Take 1 tablet (200 mg total) by mouth 2 (two) times daily. 03/09/20   Butch PennyMillikan, Megan, NP  levETIRAcetam (KEPPRA) 1000 MG tablet Take 1 tablet (1,000 mg total) by mouth 2 (two) times daily. 03/04/20   Butch PennyMillikan, Megan, NP  levETIRAcetam (KEPPRA) 1000 MG tablet Take 1 tablet (1,000 mg total) by mouth 2 (two) times daily. Patient not taking: No sig reported 03/04/20   Butch PennyMillikan, Megan, NP  losartan (COZAAR) 100 MG tablet Take 100 mg by mouth daily.    [provider]  Multiple Vitamin (MULTIVITAMIN WITH MINERALS) TABS tablet Take 1 tablet by mouth daily. Patient not taking:  No sig reported 01/10/20   Pahwani, Kasandra Knudsen, MD  Nutritional Supplements (,FEEDING SUPPLEMENT, PROSOURCE PLUS) liquid Take 30 mLs by mouth 2 (two) times daily between meals. Patient not taking: No sig reported 01/10/20   Pahwani, Rinka R, MD  polyethylene glycol (MIRALAX / GLYCOLAX) 17 g packet Take 17 g by mouth daily as needed for mild constipation. Patient not taking: No sig reported 01/09/20   Ollen Bowl, MD    Physical Exam: Vitals:   03/27/20 0030 03/27/20 0045 03/27/20 0100 03/27/20 0115  BP:   (!) 173/106   Pulse: (!) 113  (!) 109 (!) 109  Resp: 19 20 20 20   Temp:      TempSrc:      SpO2: 97%  97% 98%  Weight:      Height:        Constitutional: NAD, calm  Eyes: PERTLA, lids and conjunctivae normal ENMT: Mucous membranes are moist. Posterior pharynx clear of any exudate or lesions.   Neck: normal, supple, no masses, no  thyromegaly Respiratory: no wheezing, no crackles. No accessory muscle use.  Cardiovascular: S1 & S2 heard, regular rate and rhythm. No extremity edema.   Abdomen: No distension, no tenderness, soft. Bowel sounds active.  Musculoskeletal: no clubbing / cyanosis. No joint deformity upper and lower extremities.   Skin: no significant rashes, lesions, ulcers. Warm, dry, well-perfused, poor turgor. Neurologic: No gross facial asymmetry. Sensation intact. Moving all extremities.  Psychiatric: Alert, oriented to person only. Pleasant and cooperative.    Labs and Imaging on Admission: I have personally reviewed following labs and imaging studies  CBC: Recent Labs  Lab 03/22/20 0018 03/22/20 0039 03/25/20 1300 03/25/20 1450 03/27/20 0033  WBC 11.2*  --   --  7.2 16.0*  NEUTROABS 8.7*  --   --  5.1  --   HGB 11.3* 11.9* 12.9 10.7* 12.4  HCT 34.8* 35.0* 38.0 32.5* 37.5  MCV 98.0  --   --  99.4 97.2  PLT 318  --   --  309 311   Basic Metabolic Panel: Recent Labs  Lab 03/22/20 0018 03/22/20 0039 03/25/20 1233 03/25/20 1300 03/27/20 0033  NA 133* 135 139 138 138  K 4.7 4.7 5.3* 6.5* 5.1  CL 100 100 106 109 108  CO2 21*  --  21*  --  19*  GLUCOSE 130* 129* 107* 102* 147*  BUN 29* 30* 29* 42* 27*  CREATININE 1.52* 1.40* 1.42* 1.40* 1.52*  CALCIUM 9.8  --  9.9  --  9.8   GFR: Estimated Creatinine Clearance: 18.6 mL/min (A) (by C-G formula based on SCr of 1.52 mg/dL (H)). Liver Function Tests: Recent Labs  Lab 03/22/20 0018 03/25/20 1233  AST 23 27  ALT 19 19  ALKPHOS 70 78  BILITOT 0.5 0.8  PROT 7.8 9.0*  ALBUMIN 3.9 4.3   No results for input(s): LIPASE, AMYLASE in the last 168 hours. No results for input(s): AMMONIA in the last 168 hours. Coagulation Profile: Recent Labs  Lab 03/22/20 0018 03/25/20 1233 03/25/20 1450 03/27/20 0033  INR 1.0 QUESTIONABLE RESULT/NEW SPEC REQ 0.9 0.9   Cardiac Enzymes: No results for input(s): CKTOTAL, CKMB, CKMBINDEX, TROPONINI in  the last 168 hours. BNP (last 3 results) No results for input(s): PROBNP in the last 8760 hours. HbA1C: No results for input(s): HGBA1C in the last 72 hours. CBG: Recent Labs  Lab 03/25/20 1234  GLUCAP 95   Lipid Profile: No results for input(s): CHOL, HDL, LDLCALC, TRIG, CHOLHDL,  LDLDIRECT in the last 72 hours. Thyroid Function Tests: No results for input(s): TSH, T4TOTAL, FREET4, T3FREE, THYROIDAB in the last 72 hours. Anemia Panel: No results for input(s): VITAMINB12, FOLATE, FERRITIN, TIBC, IRON, RETICCTPCT in the last 72 hours. Urine analysis:    Component Value Date/Time   COLORURINE YELLOW 01/04/2020 2236   APPEARANCEUR CLEAR 01/04/2020 2236   LABSPEC 1.013 01/04/2020 2236   PHURINE 5.0 01/04/2020 2236   GLUCOSEU NEGATIVE 01/04/2020 2236   HGBUR SMALL (A) 01/04/2020 2236   BILIRUBINUR NEGATIVE 01/04/2020 2236   KETONESUR NEGATIVE 01/04/2020 2236   PROTEINUR 100 (A) 01/04/2020 2236   UROBILINOGEN 0.2 03/05/2014 1324   NITRITE NEGATIVE 01/04/2020 2236   LEUKOCYTESUR TRACE (A) 01/04/2020 2236   Sepsis Labs: @LABRCNTIP (procalcitonin:4,lacticidven:4) ) Recent Results (from the past 240 hour(s))  Resp Panel by RT-PCR (Flu A&B, Covid) Nasopharyngeal Swab     Status: None   Collection Time: 03/25/20 12:33 PM   Specimen: Nasopharyngeal Swab; Nasopharyngeal(NP) swabs in vial transport medium  Result Value Ref Range Status   SARS Coronavirus 2 by RT PCR NEGATIVE NEGATIVE Final    Comment: (NOTE) SARS-CoV-2 target nucleic acids are NOT DETECTED.  The SARS-CoV-2 RNA is generally detectable in upper respiratory specimens during the acute phase of infection. The lowest concentration of SARS-CoV-2 viral copies this assay can detect is 138 copies/mL. A negative result does not preclude SARS-Cov-2 infection and should not be used as the sole basis for treatment or other patient management decisions. A negative result may occur with  improper specimen collection/handling,  submission of specimen other than nasopharyngeal swab, presence of viral mutation(s) within the areas targeted by this assay, and inadequate number of viral copies(<138 copies/mL). A negative result must be combined with clinical observations, patient history, and epidemiological information. The expected result is Negative.  Fact Sheet for Patients:  03/27/20  Fact Sheet for Healthcare Providers:  BloggerCourse.com  This test is no t yet approved or cleared by the [employer] FDA and  has been authorized for detection and/or diagnosis of SARS-CoV-2 by FDA under an Emergency Use Authorization (EUA). This EUA will remain  in effect (meaning this test can be used) for the duration of the COVID-19 declaration under Section 564(b)(1) of the Act, 21 U.S.C.section 360bbb-3(b)(1), unless the authorization is terminated  or revoked sooner.       Influenza A by PCR NEGATIVE NEGATIVE Final   Influenza B by PCR NEGATIVE NEGATIVE Final    Comment: (NOTE) The Xpert Xpress SARS-CoV-2/FLU/RSV plus assay is intended as an aid in the diagnosis of influenza from Nasopharyngeal swab specimens and should not be used as a sole basis for treatment. Nasal washings and aspirates are unacceptable for Xpert Xpress SARS-CoV-2/FLU/RSV testing.  Fact Sheet for Patients: [place]  Fact Sheet for Healthcare Providers: BloggerCourse.com  This test is not yet approved or cleared by the [employer] FDA and has been authorized for detection and/or diagnosis of SARS-CoV-2 by FDA under an Emergency Use Authorization (EUA). This EUA will remain in effect (meaning this test can be used) for the duration of the COVID-19 declaration under Section 564(b)(1) of the Act, 21 U.S.C. section 360bbb-3(b)(1), unless the authorization is terminated or revoked.  Performed at [facility], [address] [place], [place]      Radiological Exams on Admission: DG Chest 1 View  Result Date: 03/27/2020 CLINICAL DATA:  03/29/2020 from the bed. EXAM: CHEST  1 VIEW COMPARISON:  01/04/2020 FINDINGS: Chronic cardiomegaly and aortic atherosclerosis. Possible  pulmonary venous hypertension but no frank edema. No consolidation or collapse. No effusion. No acute fracture identified. Chronic/healing fracture of the right humeral head. IMPRESSION: Cardiomegaly and aortic atherosclerosis. Possible pulmonary venous hypertension. No frank edema. Electronically Signed   By: Paulina Fusi M.D.   On: 03/27/2020 00:00   CT HEAD WO CONTRAST  Result Date: 03/27/2020 CLINICAL DATA:  Status post fall. EXAM: CT HEAD WITHOUT CONTRAST CT CERVICAL SPINE WITHOUT CONTRAST TECHNIQUE: Multidetector CT imaging of the head and cervical spine was performed following the standard protocol without intravenous contrast. Multiplanar CT image reconstructions of the cervical spine were also generated. COMPARISON:  None. FINDINGS: CT HEAD FINDINGS Brain: There is mild cerebral atrophy with widening of the extra-axial spaces and ventricular dilatation. There are areas of decreased attenuation within the white matter tracts of the supratentorial brain, consistent with microvascular disease changes. Vascular: No hyperdense vessel or unexpected calcification. Skull: Normal. Negative for fracture or focal lesion. Sinuses/Orbits: No acute finding. Other: None. CT CERVICAL SPINE FINDINGS Alignment: There is approximately 1 mm to 2 mm retrolisthesis of the C4 vertebral body on C5. Skull base and vertebrae: No acute fracture. No primary bone lesion or focal pathologic process. Soft tissues and spinal canal: No prevertebral fluid or swelling. No visible canal hematoma. Disc levels: Mild to moderate severity endplate sclerosis is seen at the levels of C2-C3, and C4-C5. Mild anterior osteophyte formation is present at the levels  of C3-C4 and C5-C6. Moderate severity intervertebral disc space narrowing is seen at the levels of C2-C3, C4-C5 and C5-C6. Normal bilateral multilevel facet joints are noted. Upper chest: Negative. Other: N/A IMPRESSION: 1. Mild cerebral atrophy and microvascular disease changes of the supratentorial brain. 2. No evidence of an acute fracture or subluxation of the cervical spine. 3. Moderate severity degenerative changes of the cervical spine, as described above. Electronically Signed   By: Aram Candela M.D.   On: 03/27/2020 00:16   CT HEAD WO CONTRAST  Result Date: 03/25/2020 CLINICAL DATA:  Mental status change EXAM: CT HEAD WITHOUT CONTRAST TECHNIQUE: Contiguous axial images were obtained from the base of the skull through the vertex without intravenous contrast. COMPARISON:  03/22/2020 FINDINGS: Brain: There is no acute intracranial hemorrhage, mass effect, or edema. No new loss of gray-white differentiation. There is no extra-axial fluid collection. Chronic parasagittal left frontoparietal infarct. Confluent areas of hypoattenuation in the supratentorial white matter likely reflecting stable chronic microvascular ischemic changes. Ventricles and sulci are stable in size and configuration. Vascular: No hyperdense vessel.There is atherosclerotic calcification at the skull base. Skull: Calvarium is unremarkable. Sinuses/Orbits: No acute finding. Other: None. IMPRESSION: No acute intracranial abnormality. Stable chronic/nonemergent findings detailed above. Electronically Signed   By: Guadlupe Spanish M.D.   On: 03/25/2020 14:52   CT CERVICAL SPINE WO CONTRAST  Result Date: 03/27/2020 CLINICAL DATA:  Status post fall. EXAM: CT HEAD WITHOUT CONTRAST CT CERVICAL SPINE WITHOUT CONTRAST TECHNIQUE: Multidetector CT imaging of the head and cervical spine was performed following the standard protocol without intravenous contrast. Multiplanar CT image reconstructions of the cervical spine were also generated.  COMPARISON:  None. FINDINGS: CT HEAD FINDINGS Brain: There is mild cerebral atrophy with widening of the extra-axial spaces and ventricular dilatation. There are areas of decreased attenuation within the white matter tracts of the supratentorial brain, consistent with microvascular disease changes. Vascular: No hyperdense vessel or unexpected calcification. Skull: Normal. Negative for fracture or focal lesion. Sinuses/Orbits: No acute finding. Other: None. CT CERVICAL SPINE FINDINGS Alignment: There is approximately  1 mm to 2 mm retrolisthesis of the C4 vertebral body on C5. Skull base and vertebrae: No acute fracture. No primary bone lesion or focal pathologic process. Soft tissues and spinal canal: No prevertebral fluid or swelling. No visible canal hematoma. Disc levels: Mild to moderate severity endplate sclerosis is seen at the levels of C2-C3, and C4-C5. Mild anterior osteophyte formation is present at the levels of C3-C4 and C5-C6. Moderate severity intervertebral disc space narrowing is seen at the levels of C2-C3, C4-C5 and C5-C6. Normal bilateral multilevel facet joints are noted. Upper chest: Negative. Other: N/A IMPRESSION: 1. Mild cerebral atrophy and microvascular disease changes of the supratentorial brain. 2. No evidence of an acute fracture or subluxation of the cervical spine. 3. Moderate severity degenerative changes of the cervical spine, as described above. Electronically Signed   By: Aram Candela M.D.   On: 03/27/2020 00:16   DG Knee Complete 4 Views Left  Result Date: 03/27/2020 CLINICAL DATA:  Status post fall. EXAM: LEFT KNEE - COMPLETE 4+ VIEW COMPARISON:  None. FINDINGS: No evidence of fracture, dislocation, or joint effusion. No evidence of arthropathy or other focal bone abnormality. Soft tissues are unremarkable. IMPRESSION: Negative. Electronically Signed   By: Aram Candela M.D.   On: 03/27/2020 00:04   DG Hip Unilat W or Wo Pelvis 2-3 Views Left  Result Date:  03/27/2020 CLINICAL DATA:  Status post fall. EXAM: DG HIP (WITH OR WITHOUT PELVIS) 2-3V LEFT COMPARISON:  None. FINDINGS: Acute fracture deformity is seen extending through the neck of the proximal left femur. Approximately 1 shaft with superior displacement of the distal fracture site is noted. A radiopaque intramedullary rod and compression screw device are seen within the proximal right femur. There is no evidence of dislocation. There is no evidence of arthropathy or other focal bone abnormality. IMPRESSION: Acute fracture of the proximal left femur. Electronically Signed   By: Aram Candela M.D.   On: 03/27/2020 00:04    EKG: Independently reviewed. Sinus tachycardia (rate 103), chronic LBBB.   Assessment/Plan   1. Left hip fracture  - Presents with left hip pain and deformity after being found on the floor beside her bed and is found to have acute proximal left femur fracture  - Orthopedic surgery consulting and much appreciated  - Based on the available data, Mrs. Pillay presents an estimated 2.7% risk of perioperative MI or cardiac arrest  - Keep NPO, hold ASA and ARB, continue pain-control and supportive care    2. SIRS  - Leukocytosis and mild tachycardia noted on admission without fever or apparent infectious process  - Likely reactive, continue pain-control, supportive care, and culture if febrile    3. Chronic systolic CHF  - Appears hypovolemic on admission  - EF was 30-35% in September 2021  - Hold ARB perioperatively, continue Coreg, judicious IVF   4. CKD IIIb  - SCr is 1.52 on admission, close to apparent baseline  - Renally-dose medications, monitor    5. Seizures  - Continue Keppra and Vimpat    6. Dementia  - Continue Aricept, delirium precautions    7. History of CVA  - Hold ASA prior to surgery     DVT prophylaxis: SCDs  Code Status: Full  Level of Care: Level of care: Med-Surg Family Communication: Son updated in ED  Disposition Plan:  Patient is  from: home  Anticipated d/c is to: SNF Anticipated d/c date is: 03/30/20 Patient currently: Pending orthopedic surgery consultation, likely operative repair  Consults  called: Orthopedic surgery  Admission status: Inpatient     Briscoe Deutscher, MD [facility]  03/27/2020, 2:51 AM

## 2020-03-27 NOTE — Progress Notes (Signed)
Care started prior to midnight in the emergency room and patient was admitted early this morning after midnight by Dr. Marcial Pacas Opyd and I am in current agreement with his assessment and plan.  Additional changes to the plan of care been made accordingly.  The patient is a 80 year old extremely thin African-American female with a past medical history significant for but not limited to dementia, chronic combined systolic and diastolic CHF, nonobstructive CAD, CKD stage IIIb, history of a CVA, seizure disorder, hypertension as well as other comorbidities presented to the emergency room for evaluation after a fall.  She is found on the floor next to her bed at her home and because of her dementia she is unable to recall what happened.  She denies any pain but when her left leg was moved she grimaced in pain.  She has been living with her son recently was seen in the emergency department 2 days ago for transient speech disturbance but had a head CT that day with no acute findings and returned to baseline and was sent home.  In the ED is worked up and found to have an elevated blood pressure, EKG showed sinus tachycardia, and chest x-ray was notable for cardiomegaly with questionable pulmonary venous hypertension but no frank edema noted.  X-rays of the knee were negative and there is no acute cervical spine fracture or subluxation noted on CT scan.  CT of the head demonstrated mild cerebral atrophy and changes of microvascular disease without acute findings.  Radiographs of the hip and pelvis demonstrate acute proximal left femur fracture.  In the ED she was treated with IV morphine and Zofran and orthopedic surgery was consulted for further evaluation.  Currently she is being admitted for and treated for the following but not limited to:  Acute left hip/femur fracture  - Presents with left hip pain and deformity after being found on the floor beside her bed and is found to have acute proximal left femur fracture  -  Orthopedic surgery consulting and much appreciated and Dr. Linna Caprice planning on surgical intervention on Sunday, 03/29/2020 with n.p.o. at midnight Saturday into Sunday - Based on the available data, [name] presents an estimated 2.7% risk of perioperative MI or cardiac arrest  - She was initially kept NPO but since she is going to have surgery on Sunday we will give her diet today, hold ASA and ARB, continue pain-control and supportive care   -Ensure good bowel regimen.  SIRS  - Leukocytosis and mild tachycardia noted on admission without fever or apparent infectious process  - Likely reactive, continue pain-control, supportive care, and culture if febrile   -WBC went from 16.0 now 12.4 -Heart rate remains a little bit elevated in the mid 100s -Of note respiratory panel by PCR with influenza AMB was negative along with SARS coronavirus 2 being negative -Continue to monitor temperature curve and WBC and repeat in a.m.  Chronic combined systolic and diastolicic CHF  - Appears hypovolemic on admission  - EF was 30-35% in September 2021  - Hold ARB perioperatively, continue Coreg, judicious IVF  in accordance not to volume overload -Strict I's and O's and daily weights  CKD IIIb  - SCr is 1.52 on admission, close to apparent baseline  and has improved and BUN/creatinine is now 25/1.35 -Continue to avoid nephrotoxic medications, contrast dyes, hypotension and renally adjust and dose medications -Trend trend and repeat CMP in a.m.  Normocytic anemia/anemia of chronic kidney disease -Patient's hemoglobin/hematocrit went from 12.4/37.5 and today  it is now 11.8/35.66-check anemia panel in the a.m. -Continue monitor for signs and symptoms of bleeding; currently no overt bleeding noted -Expect a postoperative drop -Repeat CBC in a.m.  Seizures  -Continue Keppra and Vimpat    Dementia  -Continue Aricept -Delirium precautions    History of CVA  -Hold ASA prior to surgery    We  will continue to monitor the patient's clinical response to intervention and follow-up on orthopedic surgery recommendations.  We will repeat blood work in the a.m.

## 2020-03-28 DIAGNOSIS — G309 Alzheimer's disease, unspecified: Secondary | ICD-10-CM | POA: Diagnosis not present

## 2020-03-28 DIAGNOSIS — S72002A Fracture of unspecified part of neck of left femur, initial encounter for closed fracture: Secondary | ICD-10-CM | POA: Diagnosis not present

## 2020-03-28 DIAGNOSIS — I5042 Chronic combined systolic (congestive) and diastolic (congestive) heart failure: Secondary | ICD-10-CM | POA: Diagnosis not present

## 2020-03-28 DIAGNOSIS — N1832 Chronic kidney disease, stage 3b: Secondary | ICD-10-CM | POA: Diagnosis not present

## 2020-03-28 LAB — CBC WITH DIFFERENTIAL/PLATELET
Abs Immature Granulocytes: 0.04 10*3/uL (ref 0.00–0.07)
Basophils Absolute: 0 10*3/uL (ref 0.0–0.1)
Basophils Relative: 0 %
Eosinophils Absolute: 0.1 10*3/uL (ref 0.0–0.5)
Eosinophils Relative: 1 %
HCT: 33.4 % — ABNORMAL LOW (ref 36.0–46.0)
Hemoglobin: 11.1 g/dL — ABNORMAL LOW (ref 12.0–15.0)
Immature Granulocytes: 0 %
Lymphocytes Relative: 12 %
Lymphs Abs: 1.1 10*3/uL (ref 0.7–4.0)
MCH: 32.5 pg (ref 26.0–34.0)
MCHC: 33.2 g/dL (ref 30.0–36.0)
MCV: 97.7 fL (ref 80.0–100.0)
Monocytes Absolute: 1 10*3/uL (ref 0.1–1.0)
Monocytes Relative: 11 %
Neutro Abs: 7 10*3/uL (ref 1.7–7.7)
Neutrophils Relative %: 76 %
Platelets: 239 10*3/uL (ref 150–400)
RBC: 3.42 MIL/uL — ABNORMAL LOW (ref 3.87–5.11)
RDW: 17.7 % — ABNORMAL HIGH (ref 11.5–15.5)
WBC: 9.3 10*3/uL (ref 4.0–10.5)
nRBC: 0 % (ref 0.0–0.2)

## 2020-03-28 LAB — COMPREHENSIVE METABOLIC PANEL
ALT: 17 U/L (ref 0–44)
AST: 16 U/L (ref 15–41)
Albumin: 3.5 g/dL (ref 3.5–5.0)
Alkaline Phosphatase: 64 U/L (ref 38–126)
Anion gap: 9 (ref 5–15)
BUN: 25 mg/dL — ABNORMAL HIGH (ref 8–23)
CO2: 20 mmol/L — ABNORMAL LOW (ref 22–32)
Calcium: 9.4 mg/dL (ref 8.9–10.3)
Chloride: 106 mmol/L (ref 98–111)
Creatinine, Ser: 1.13 mg/dL — ABNORMAL HIGH (ref 0.44–1.00)
GFR, Estimated: 50 mL/min — ABNORMAL LOW (ref >60)
Glucose, Bld: 114 mg/dL — ABNORMAL HIGH (ref 70–99)
Potassium: 4.4 mmol/L (ref 3.5–5.1)
Sodium: 135 mmol/L (ref 135–145)
Total Bilirubin: 0.8 mg/dL (ref 0.3–1.2)
Total Protein: 7.4 g/dL (ref 6.5–8.1)

## 2020-03-28 LAB — PHOSPHORUS: Phosphorus: 3.1 mg/dL (ref 2.5–4.6)

## 2020-03-28 LAB — IRON AND TIBC
Iron: 61 ug/dL (ref 28–170)
Saturation Ratios: 25 % (ref 10.4–31.8)
TIBC: 244 ug/dL — ABNORMAL LOW (ref 250–450)
UIBC: 183 ug/dL

## 2020-03-28 LAB — VITAMIN B12: Vitamin B-12: 601 pg/mL (ref 180–914)

## 2020-03-28 LAB — RETICULOCYTES
Immature Retic Fract: 13.5 % (ref 2.3–15.9)
RBC.: 3.42 MIL/uL — ABNORMAL LOW (ref 3.87–5.11)
Retic Count, Absolute: 40 10*3/uL (ref 19.0–186.0)
Retic Ct Pct: 1.2 % (ref 0.4–3.1)

## 2020-03-28 LAB — FOLATE: Folate: 22.4 ng/mL (ref >5.9)

## 2020-03-28 LAB — FERRITIN: Ferritin: 661 ng/mL — ABNORMAL HIGH (ref 11–307)

## 2020-03-28 LAB — MAGNESIUM: Magnesium: 1.8 mg/dL (ref 1.7–2.4)

## 2020-03-28 MED ORDER — MAGNESIUM SULFATE 2 GM/50ML IV SOLN
2.0000 g | Freq: Once | INTRAVENOUS | Status: AC
  Administered 2020-03-28: 10:00:00 2 g via INTRAVENOUS
  Filled 2020-03-28: qty 50

## 2020-03-28 NOTE — Progress Notes (Addendum)
PROGRESS NOTE    [name]  WJX:[id number] DOB: 09/13/1940 DOA: 03/26/2020 PCP: [name], MD   Brief Narrative:  The patient is a 80 year old extremely thin African-American female with a past medical history significant for but not limited to dementia, chronic combined systolic and diastolic CHF, nonobstructive CAD, CKD stage IIIb, history of a CVA, seizure disorder, hypertension as well as other comorbidities presented to the emergency room for evaluation after a fall.  She is found on the floor next to her bed at her home and because of her dementia she is unable to recall what happened.  She denies any pain but when her left leg was moved she grimaced in pain.  She has been living with her son recently was seen in the emergency department 2 days ago for transient speech disturbance but had a head CT that day with no acute findings and returned to baseline and was sent home.  In the ED is worked up and found to have an elevated blood pressure, EKG showed sinus tachycardia, and chest x-ray was notable for cardiomegaly with questionable pulmonary venous hypertension but no frank edema noted.  X-rays of the knee were negative and there is no acute cervical spine fracture or subluxation noted on CT scan.  CT of the head demonstrated mild cerebral atrophy and changes of microvascular disease without acute findings.  Radiographs of the hip and pelvis demonstrate acute proximal left femur fracture.  In the ED she was treated with IV morphine and Zofran and orthopedic surgery was consulted for further evaluation.  **Interim History Orthopedic Surgery planning Surgical Intervention Sunday 03/29/20.   Assessment & Plan:   Principal Problem:   Closed fracture of left hip (HCC) Active Problems:   Seizure disorder (HCC)   H/O: CVA (cerebrovascular accident)   Chronic combined systolic and diastolic congestive heart failure (HCC)   Chronic kidney disease, stage 3b (HCC)   Alzheimer's  dementia without behavioral disturbance (HCC)   SIRS (systemic inflammatory response syndrome) (HCC)  Acute Left Hip/Femur Fracture -Presents with left hip pain and deformity after being found on the floor beside her bed and is found to have acute proximal left femur fracture -Orthopedic surgery consulting and much appreciatedand Dr. Linna Caprice planning on surgical intervention on Sunday, 03/29/2020 with n.p.o. at midnight Saturday into Sunday -Based on the available data, [name] presents an estimated 2.7% risk of perioperative MI or cardiac arrest -She was initially kept NPO but since she is going to have surgery on Sunday we will give her diet today, hold ASA and ARB, continue pain-control and supportive care -Ensure good bowel regimen. -Continues to have Pain when she moves: Pain control with IV Morphine 1-2 mg q3hprn Moderate Pain, Severe Pain -C/w Antiemetics with IV Ondansetron 4 mg q6hprn Nausea/Vomiting   SIRS -Leukocytosis and mild tachycardia noted on admission without fever or apparent infectious process -Likely reactive, continue pain-control, supportive care, and culture if febrile -WBC went from 16.0 -> 12.4 -> 9.3 -Heart rate remains a little bit elevated in the mid 100s and this AM was documented to be 108 and is still tachycardic on Exam -Of note respiratory panel by PCR with influenza AMB was negative along with SARS coronavirus 2 being negative -Continue to monitor temperature curve and WBC and repeat in a.m.  Chronic combined systolic and diastolicic CHF -Appears hypovolemic on admission -EF was 30-35% in September 2021 -Hold ARB perioperatively, continue Carvedilol 12.5 mg po BID, judicious IVFin accordance not to volume overload -Patient is -143.9  mL since admission -Strict I's and O's and daily weights -Continue to Monitor Volume Status and for S/Sx of Volume Overload   CKD IIIb Metabolic Acidosis -SCr is 1.52 on admission, close to  apparent baseline and has improved and BUN/creatinine is now trending down further from 25/1.35 -> 25/1.13 -Has a mild Acidosis with a CO2 of 20, AG of 9, and Chloride Level of 106 -Not on Anymore IVF given Hx of Heart Failure -Continue to avoid nephrotoxic medications, contrast dyes, hypotension and renally adjust and dose medications -Trend trend and repeat CMP in a.m.  HTN -C/w Amlodipine 10 mg po Daily, and Carvedilol 12.5 mg po BID -C/w Labetalol 10 mg IV q6hprn SBP >180 or DBP > 100 -Continue to Monitor BP per Protocol -Last BP was 153/98  Normocytic anemia/anemia of chronic kidney disease -Patient's hemoglobin/hematocrit went from 12.4/37.5 ->11.8/35.66 -> 11.1/33.4 -Check anemia panel this AM  -Continue monitor for signs and symptoms of bleeding; currently no overt bleeding noted -Expect a postoperative drop -Repeat CBC in a.m.  Seizures -Continue Levetiracetam 1000 mg po BID and Lacosamide 200 mg po BID   Dementia -Continue Donepezil 5 mg po Daily  -Delirium precautions  History of CVA -Hold ASA prior to surgery  Underweight/Severe Malnutrition in the Context of Chronic Illness -Nutritionist consulted for further evaluation and recommendations -C/w Ensure Surgery po BID and MVI + Minerals Daily    DVT prophylaxis: SCDs Code Status: FULL CODE Family Communication: No family present at bedside I called her number in the chart for her son [name] and he went to her friend [name] lives with her and [name] came to visit her today and still he is updated and will let her son know the plan. Disposition Plan: Pending further surgical intervention and clearance by Orthopedic Surgery  Status is: Inpatient  Remains inpatient appropriate because:Unsafe d/c plan, IV treatments appropriate due to intensity of illness or inability to take PO and Inpatient level of care appropriate due to severity of illness   Dispo: The patient is from: Home               Anticipated d/c is to: SNF              Patient currently is not medically stable to d/c.   Difficult to place patient No  Consultants:   Orthopedic Surgery    Procedures: None  Antimicrobials:  Anti-infectives (From admission, onward)   None        Subjective: Seen and examined at bedside and she is eating her breakfast and had no acute distress.  Complains of pain whenever she moves her leg.  No nausea or vomiting.  Felt okay.  No other concerns or complaints at this time.  Objective: Vitals:   03/27/20 2132 03/28/20 0114 03/28/20 0606 03/28/20 0621  BP: (!) 154/94 (!) 171/96 (!) 163/104 (!) 153/98  Pulse: (!) 103 (!) 105 (!) 103 (!) 108  Resp: 15 14 16    Temp: 98.1 F (36.7 C) 98 F (36.7 C) 99.5 F (37.5 C)   TempSrc: Oral Oral Oral   SpO2: 97% 98% 99% 99%  Weight:      Height:        Intake/Output Summary (Last 24 hours) at 03/28/2020 0816 Last data filed at 03/28/2020 0600 Gross per 24 hour  Intake 706.06 ml  Output 850 ml  Net -143.94 ml   Filed Weights   03/26/20 2229  Weight: 38 kg   Examination: Physical Exam:  Constitutional: Patient is  a thin elderly African-American female currently in NAD and appears calm and comfortable eating her breakfast but then complains of pain when she moves her leg Eyes: Lids and conjunctivae normal, sclerae anicteric  ENMT: External Ears, Nose appear normal. Grossly normal hearing.  Neck: Appears normal, supple, no cervical masses, normal ROM, no appreciable thyromegaly; no JVD Respiratory: Diminished to auscultation bilaterally with slightly coarse breath sounds, no wheezing, rales, rhonchi or crackles. Normal respiratory effort and patient is not tachypenic. No accessory muscle use.  Unlabored breathing Cardiovascular: Tachycardic rate, no murmurs / rubs / gallops. S1 and S2 auscultated.  Minimal extremity edema Abdomen: Soft, non-tender, non-distended. Bowel sounds positive.  GU: Deferred. Musculoskeletal: Left leg  is shortened and externally rotated compared to right Skin: No rashes, lesions, ulcers on limited skin evaluation. No induration; Warm and dry.  Neurologic: CN 2-12 grossly intact with no focal deficits. Romberg sign and cerebellar reflexes not assessed.  Psychiatric: Mildly impaired judgment and insight.  She is awake and alert. Normal mood and appropriate affect.   Data Reviewed: I have personally reviewed following labs and imaging studies  CBC: Recent Labs  Lab 03/22/20 0018 03/22/20 0039 03/25/20 1300 03/25/20 1450 03/27/20 0033 03/27/20 0532 03/28/20 0359  WBC 11.2*  --   --  7.2 16.0* 12.4* 9.3  NEUTROABS 8.7*  --   --  5.1  --   --  7.0  HGB 11.3*   < > 12.9 10.7* 12.4 11.8* 11.1*  HCT 34.8*   < > 38.0 32.5* 37.5 35.6* 33.4*  MCV 98.0  --   --  99.4 97.2 98.3 97.7  PLT 318  --   --  309 311 280 239   < > = values in this interval not displayed.   Basic Metabolic Panel: Recent Labs  Lab 03/22/20 0018 03/22/20 0039 03/25/20 1233 03/25/20 1300 03/27/20 0033 03/27/20 0532 03/28/20 0359  NA 133*   < > 139 138 138 137 135  K 4.7   < > 5.3* 6.5* 5.1 4.3 4.4  CL 100   < > 106 109 108 106 106  CO2 21*  --  21*  --  19* 20* 20*  GLUCOSE 130*   < > 107* 102* 147* 128* 114*  BUN 29*   < > 29* 42* 27* 25* 25*  CREATININE 1.52*   < > 1.42* 1.40* 1.52* 1.35* 1.13*  CALCIUM 9.8  --  9.9  --  9.8 9.5 9.4  MG  --   --   --   --   --   --  1.8  PHOS  --   --   --   --   --   --  3.1   < > = values in this interval not displayed.   GFR: Estimated Creatinine Clearance: 25 mL/min (A) (by C-G formula based on SCr of 1.13 mg/dL (H)). Liver Function Tests: Recent Labs  Lab 03/22/20 0018 03/25/20 1233 03/28/20 0359  AST 23 27 16   ALT 19 19 17   ALKPHOS 70 78 64  BILITOT 0.5 0.8 0.8  PROT 7.8 9.0* 7.4  ALBUMIN 3.9 4.3 3.5   No results for input(s): LIPASE, AMYLASE in the last 168 hours. No results for input(s): AMMONIA in the last 168 hours. Coagulation Profile: Recent  Labs  Lab 03/22/20 0018 03/25/20 1233 03/25/20 1450 03/27/20 0033  INR 1.0 QUESTIONABLE RESULT/NEW SPEC REQ 0.9 0.9   Cardiac Enzymes: No results for input(s): CKTOTAL, CKMB, CKMBINDEX, TROPONINI in the last 168  hours. BNP (last 3 results) No results for input(s): PROBNP in the last 8760 hours. HbA1C: No results for input(s): HGBA1C in the last 72 hours. CBG: Recent Labs  Lab 03/25/20 1234  GLUCAP 95   Lipid Profile: No results for input(s): CHOL, HDL, LDLCALC, TRIG, CHOLHDL, LDLDIRECT in the last 72 hours. Thyroid Function Tests: No results for input(s): TSH, T4TOTAL, FREET4, T3FREE, THYROIDAB in the last 72 hours. Anemia Panel: No results for input(s): VITAMINB12, FOLATE, FERRITIN, TIBC, IRON, RETICCTPCT in the last 72 hours. Sepsis Labs: No results for input(s): PROCALCITON, LATICACIDVEN in the last 168 hours.  Recent Results (from the past 240 hour(s))  Resp Panel by RT-PCR (Flu A&B, Covid) Nasopharyngeal Swab     Status: None   Collection Time: 03/25/20 12:33 PM   Specimen: Nasopharyngeal Swab; Nasopharyngeal(NP) swabs in vial transport medium  Result Value Ref Range Status   SARS Coronavirus 2 by RT PCR NEGATIVE NEGATIVE Final    Comment: (NOTE) SARS-CoV-2 target nucleic acids are NOT DETECTED.  The SARS-CoV-2 RNA is generally detectable in upper respiratory specimens during the acute phase of infection. The lowest concentration of SARS-CoV-2 viral copies this assay can detect is 138 copies/mL. A negative result does not preclude SARS-Cov-2 infection and should not be used as the sole basis for treatment or other patient management decisions. A negative result may occur with  improper specimen collection/handling, submission of specimen other than nasopharyngeal swab, presence of viral mutation(s) within the areas targeted by this assay, and inadequate number of viral copies(<138 copies/mL). A negative result must be combined with clinical observations, patient  history, and epidemiological information. The expected result is Negative.  Fact Sheet for Patients:  BloggerCourse.com  Fact Sheet for Healthcare Providers:  SeriousBroker.it  This test is no t yet approved or cleared by the [place] FDA and  has been authorized for detection and/or diagnosis of SARS-CoV-2 by FDA under an Emergency Use Authorization (EUA). This EUA will remain  in effect (meaning this test can be used) for the duration of the COVID-19 declaration under Section 564(b)(1) of the Act, 21 U.S.C.section 360bbb-3(b)(1), unless the authorization is terminated  or revoked sooner.       Influenza A by PCR NEGATIVE NEGATIVE Final   Influenza B by PCR NEGATIVE NEGATIVE Final    Comment: (NOTE) The Xpert Xpress SARS-CoV-2/FLU/RSV plus assay is intended as an aid in the diagnosis of influenza from Nasopharyngeal swab specimens and should not be used as a sole basis for treatment. Nasal washings and aspirates are unacceptable for Xpert Xpress SARS-CoV-2/FLU/RSV testing.  Fact Sheet for Patients: BloggerCourse.com  Fact Sheet for Healthcare Providers: SeriousBroker.it  This test is not yet approved or cleared by the [place] FDA and has been authorized for detection and/or diagnosis of SARS-CoV-2 by FDA under an Emergency Use Authorization (EUA). This EUA will remain in effect (meaning this test can be used) for the duration of the COVID-19 declaration under Section 564(b)(1) of the Act, 21 U.S.C. section 360bbb-3(b)(1), unless the authorization is terminated or revoked.  Performed at [facility], [address], [place], [place] [address]   Resp Panel by RT-PCR (Flu A&B, Covid) Nasopharyngeal Swab     Status: None   Collection Time: 03/27/20  2:10 AM   Specimen: Nasopharyngeal Swab; Nasopharyngeal(NP) swabs in vial transport medium  Result  Value Ref Range Status   SARS Coronavirus 2 by RT PCR NEGATIVE NEGATIVE Final    Comment: (NOTE) SARS-CoV-2 target nucleic acids are NOT DETECTED.  The SARS-CoV-2 RNA is generally detectable in upper respiratory specimens during the acute phase of infection. The lowest concentration of SARS-CoV-2 viral copies this assay can detect is 138 copies/mL. A negative result does not preclude SARS-Cov-2 infection and should not be used as the sole basis for treatment or other patient management decisions. A negative result may occur with  improper specimen collection/handling, submission of specimen other than nasopharyngeal swab, presence of viral mutation(s) within the areas targeted by this assay, and inadequate number of viral copies(<138 copies/mL). A negative result must be combined with clinical observations, patient history, and epidemiological information. The expected result is Negative.  Fact Sheet for Patients:  BloggerCourse.com  Fact Sheet for Healthcare Providers:  SeriousBroker.it  This test is no t yet approved or cleared by the [place] FDA and  has been authorized for detection and/or diagnosis of SARS-CoV-2 by FDA under an Emergency Use Authorization (EUA). This EUA will remain  in effect (meaning this test can be used) for the duration of the COVID-19 declaration under Section 564(b)(1) of the Act, 21 U.S.C.section 360bbb-3(b)(1), unless the authorization is terminated  or revoked sooner.       Influenza A by PCR NEGATIVE NEGATIVE Final   Influenza B by PCR NEGATIVE NEGATIVE Final    Comment: (NOTE) The Xpert Xpress SARS-CoV-2/FLU/RSV plus assay is intended as an aid in the diagnosis of influenza from Nasopharyngeal swab specimens and should not be used as a sole basis for treatment. Nasal washings and aspirates are unacceptable for Xpert Xpress SARS-CoV-2/FLU/RSV testing.  Fact Sheet for  Patients: BloggerCourse.com  Fact Sheet for Healthcare Providers: SeriousBroker.it  This test is not yet approved or cleared by the [place] FDA and has been authorized for detection and/or diagnosis of SARS-CoV-2 by FDA under an Emergency Use Authorization (EUA). This EUA will remain in effect (meaning this test can be used) for the duration of the COVID-19 declaration under Section 564(b)(1) of the Act, 21 U.S.C. section 360bbb-3(b)(1), unless the authorization is terminated or revoked.  Performed at [facility], [address], [place], [place] [address]      RN Pressure Injury Documentation:   Estimated body mass index is 16.36 kg/m as calculated from the following:   Height as of this encounter: 5' (1.524 m).   Weight as of this encounter: 38 kg.  Malnutrition Type:  Nutrition Problem: Severe Malnutrition Etiology: chronic illness (dementia)  Malnutrition Characteristics:  Signs/Symptoms: moderate fat depletion,severe muscle depletion  Nutrition Interventions:  Interventions: Ensure Surgery,MVI   Radiology Studies: DG Chest 1 View  Result Date: 03/27/2020 CLINICAL DATA:  Larey Seat from the bed. EXAM: CHEST  1 VIEW COMPARISON:  01/04/2020 FINDINGS: Chronic cardiomegaly and aortic atherosclerosis. Possible pulmonary venous hypertension but no frank edema. No consolidation or collapse. No effusion. No acute fracture identified. Chronic/healing fracture of the right humeral head. IMPRESSION: Cardiomegaly and aortic atherosclerosis. Possible pulmonary venous hypertension. No frank edema. Electronically Signed   By: Paulina Fusi M.D.   On: 03/27/2020 00:00   CT HEAD WO CONTRAST  Result Date: 03/27/2020 CLINICAL DATA:  Status post fall. EXAM: CT HEAD WITHOUT CONTRAST CT CERVICAL SPINE WITHOUT CONTRAST TECHNIQUE: Multidetector CT imaging of the head and cervical spine was performed following the standard  protocol without intravenous contrast. Multiplanar CT image reconstructions of the cervical spine were also generated. COMPARISON:  None. FINDINGS: CT HEAD FINDINGS Brain: There is mild cerebral atrophy with widening of the extra-axial spaces and ventricular dilatation. There are areas of decreased attenuation  within the white matter tracts of the supratentorial brain, consistent with microvascular disease changes. Vascular: No hyperdense vessel or unexpected calcification. Skull: Normal. Negative for fracture or focal lesion. Sinuses/Orbits: No acute finding. Other: None. CT CERVICAL SPINE FINDINGS Alignment: There is approximately 1 mm to 2 mm retrolisthesis of the C4 vertebral body on C5. Skull base and vertebrae: No acute fracture. No primary bone lesion or focal pathologic process. Soft tissues and spinal canal: No prevertebral fluid or swelling. No visible canal hematoma. Disc levels: Mild to moderate severity endplate sclerosis is seen at the levels of C2-C3, and C4-C5. Mild anterior osteophyte formation is present at the levels of C3-C4 and C5-C6. Moderate severity intervertebral disc space narrowing is seen at the levels of C2-C3, C4-C5 and C5-C6. Normal bilateral multilevel facet joints are noted. Upper chest: Negative. Other: N/A IMPRESSION: 1. Mild cerebral atrophy and microvascular disease changes of the supratentorial brain. 2. No evidence of an acute fracture or subluxation of the cervical spine. 3. Moderate severity degenerative changes of the cervical spine, as described above. Electronically Signed   By: Aram Candela M.D.   On: 03/27/2020 00:16   CT CERVICAL SPINE WO CONTRAST  Result Date: 03/27/2020 CLINICAL DATA:  Status post fall. EXAM: CT HEAD WITHOUT CONTRAST CT CERVICAL SPINE WITHOUT CONTRAST TECHNIQUE: Multidetector CT imaging of the head and cervical spine was performed following the standard protocol without intravenous contrast. Multiplanar CT image reconstructions of the  cervical spine were also generated. COMPARISON:  None. FINDINGS: CT HEAD FINDINGS Brain: There is mild cerebral atrophy with widening of the extra-axial spaces and ventricular dilatation. There are areas of decreased attenuation within the white matter tracts of the supratentorial brain, consistent with microvascular disease changes. Vascular: No hyperdense vessel or unexpected calcification. Skull: Normal. Negative for fracture or focal lesion. Sinuses/Orbits: No acute finding. Other: None. CT CERVICAL SPINE FINDINGS Alignment: There is approximately 1 mm to 2 mm retrolisthesis of the C4 vertebral body on C5. Skull base and vertebrae: No acute fracture. No primary bone lesion or focal pathologic process. Soft tissues and spinal canal: No prevertebral fluid or swelling. No visible canal hematoma. Disc levels: Mild to moderate severity endplate sclerosis is seen at the levels of C2-C3, and C4-C5. Mild anterior osteophyte formation is present at the levels of C3-C4 and C5-C6. Moderate severity intervertebral disc space narrowing is seen at the levels of C2-C3, C4-C5 and C5-C6. Normal bilateral multilevel facet joints are noted. Upper chest: Negative. Other: N/A IMPRESSION: 1. Mild cerebral atrophy and microvascular disease changes of the supratentorial brain. 2. No evidence of an acute fracture or subluxation of the cervical spine. 3. Moderate severity degenerative changes of the cervical spine, as described above. Electronically Signed   By: Aram Candela M.D.   On: 03/27/2020 00:16   DG Knee Complete 4 Views Left  Result Date: 03/27/2020 CLINICAL DATA:  Status post fall. EXAM: LEFT KNEE - COMPLETE 4+ VIEW COMPARISON:  None. FINDINGS: No evidence of fracture, dislocation, or joint effusion. No evidence of arthropathy or other focal bone abnormality. Soft tissues are unremarkable. IMPRESSION: Negative. Electronically Signed   By: Aram Candela M.D.   On: 03/27/2020 00:04   DG Hip Unilat W or Wo Pelvis  2-3 Views Left  Result Date: 03/27/2020 CLINICAL DATA:  Status post fall. EXAM: DG HIP (WITH OR WITHOUT PELVIS) 2-3V LEFT COMPARISON:  None. FINDINGS: Acute fracture deformity is seen extending through the neck of the proximal left femur. Approximately 1 shaft with superior displacement of the  distal fracture site is noted. A radiopaque intramedullary rod and compression screw device are seen within the proximal right femur. There is no evidence of dislocation. There is no evidence of arthropathy or other focal bone abnormality. IMPRESSION: Acute fracture of the proximal left femur. Electronically Signed   By: Aram Candelahaddeus  Houston M.D.   On: 03/27/2020 00:04   Scheduled Meds: . amLODipine  10 mg Oral Daily  . carvedilol  12.5 mg Oral BID WC  . donepezil  5 mg Oral Daily  . feeding supplement  237 mL Oral BID BM  . lacosamide  200 mg Oral BID AC  . levETIRAcetam  1,000 mg Oral BID  . multivitamin with minerals  1 tablet Oral Daily  . pneumococcal 23 valent vaccine  0.5 mL Intramuscular Tomorrow-1000   Continuous Infusions: . magnesium sulfate bolus IVPB      LOS: 1 day   Merlene Laughtermair Latif Sheikh, DO [facility] PAGER is on AMION  If 7PM-7AM, please contact night-coverage [email or web address]

## 2020-03-28 NOTE — Plan of Care (Signed)
  Problem: Education: Goal: Knowledge of General Education information will improve Description: Including pain rating scale, medication(s)/side effects and non-pharmacologic comfort measures Outcome: Progressing   Problem: Pain Managment: Goal: General experience of comfort will improve Outcome: Progressing   

## 2020-03-29 ENCOUNTER — Encounter (HOSPITAL_COMMUNITY)
Admission: EM | Disposition: A | Discharge status: Skilled Nursing Facility | Payer: Self-pay | Source: Home / Self Care | Attending: Internal Medicine

## 2020-03-29 ENCOUNTER — Inpatient Hospital Stay (HOSPITAL_COMMUNITY): Payer: Medicare HMO

## 2020-03-29 ENCOUNTER — Inpatient Hospital Stay (HOSPITAL_COMMUNITY): Payer: Medicare HMO | Admitting: Certified Registered Nurse Anesthetist

## 2020-03-29 DIAGNOSIS — N1832 Chronic kidney disease, stage 3b: Secondary | ICD-10-CM | POA: Diagnosis not present

## 2020-03-29 DIAGNOSIS — G309 Alzheimer's disease, unspecified: Secondary | ICD-10-CM | POA: Diagnosis not present

## 2020-03-29 DIAGNOSIS — S72002A Fracture of unspecified part of neck of left femur, initial encounter for closed fracture: Secondary | ICD-10-CM | POA: Diagnosis not present

## 2020-03-29 DIAGNOSIS — I5042 Chronic combined systolic (congestive) and diastolic (congestive) heart failure: Secondary | ICD-10-CM | POA: Diagnosis not present

## 2020-03-29 HISTORY — PX: TOTAL HIP ARTHROPLASTY: SHX124

## 2020-03-29 LAB — MAGNESIUM: Magnesium: 2.7 mg/dL — ABNORMAL HIGH (ref 1.7–2.4)

## 2020-03-29 LAB — CBC WITH DIFFERENTIAL/PLATELET
Abs Immature Granulocytes: 0.09 10*3/uL — ABNORMAL HIGH (ref 0.00–0.07)
Basophils Absolute: 0 10*3/uL (ref 0.0–0.1)
Basophils Relative: 0 %
Eosinophils Absolute: 0.1 10*3/uL (ref 0.0–0.5)
Eosinophils Relative: 1 %
HCT: 34 % — ABNORMAL LOW (ref 36.0–46.0)
Hemoglobin: 11.3 g/dL — ABNORMAL LOW (ref 12.0–15.0)
Immature Granulocytes: 1 %
Lymphocytes Relative: 15 %
Lymphs Abs: 1.3 10*3/uL (ref 0.7–4.0)
MCH: 32.4 pg (ref 26.0–34.0)
MCHC: 33.2 g/dL (ref 30.0–36.0)
MCV: 97.4 fL (ref 80.0–100.0)
Monocytes Absolute: 1.2 10*3/uL — ABNORMAL HIGH (ref 0.1–1.0)
Monocytes Relative: 13 %
Neutro Abs: 6.4 10*3/uL (ref 1.7–7.7)
Neutrophils Relative %: 70 %
Platelets: 211 10*3/uL (ref 150–400)
RBC: 3.49 MIL/uL — ABNORMAL LOW (ref 3.87–5.11)
RDW: 17.5 % — ABNORMAL HIGH (ref 11.5–15.5)
WBC: 9 10*3/uL (ref 4.0–10.5)
nRBC: 0 % (ref 0.0–0.2)

## 2020-03-29 LAB — COMPREHENSIVE METABOLIC PANEL
ALT: 15 U/L (ref 0–44)
AST: 16 U/L (ref 15–41)
Albumin: 3.6 g/dL (ref 3.5–5.0)
Alkaline Phosphatase: 58 U/L (ref 38–126)
Anion gap: 8 (ref 5–15)
BUN: 41 mg/dL — ABNORMAL HIGH (ref 8–23)
CO2: 23 mmol/L (ref 22–32)
Calcium: 9.7 mg/dL (ref 8.9–10.3)
Chloride: 105 mmol/L (ref 98–111)
Creatinine, Ser: 1.27 mg/dL — ABNORMAL HIGH (ref 0.44–1.00)
GFR, Estimated: 44 mL/min — ABNORMAL LOW (ref >60)
Glucose, Bld: 116 mg/dL — ABNORMAL HIGH (ref 70–99)
Potassium: 4.8 mmol/L (ref 3.5–5.1)
Sodium: 136 mmol/L (ref 135–145)
Total Bilirubin: 0.6 mg/dL (ref 0.3–1.2)
Total Protein: 7.5 g/dL (ref 6.5–8.1)

## 2020-03-29 LAB — MRSA PCR SCREENING: MRSA by PCR: NEGATIVE

## 2020-03-29 LAB — PHOSPHORUS: Phosphorus: 2.5 mg/dL (ref 2.5–4.6)

## 2020-03-29 SURGERY — ARTHROPLASTY, HIP, TOTAL, ANTERIOR APPROACH
Anesthesia: General | Site: Hip | Laterality: Left

## 2020-03-29 MED ORDER — TRANEXAMIC ACID-NACL 1000-0.7 MG/100ML-% IV SOLN
INTRAVENOUS | Status: AC
  Filled 2020-03-29: qty 100

## 2020-03-29 MED ORDER — FENTANYL CITRATE (PF) 100 MCG/2ML IJ SOLN: 25.0000 ug | INTRAMUSCULAR | Status: DC | PRN

## 2020-03-29 MED ORDER — ISOPROPYL ALCOHOL 70 % SOLN
Status: AC
  Filled 2020-03-29: qty 480

## 2020-03-29 MED ORDER — ROCURONIUM BROMIDE 10 MG/ML (PF) SYRINGE
PREFILLED_SYRINGE | INTRAVENOUS | Status: AC
  Filled 2020-03-29: qty 10

## 2020-03-29 MED ORDER — DOCUSATE SODIUM 100 MG PO CAPS
100.0000 mg | ORAL_CAPSULE | Freq: Two times a day (BID) | ORAL | Status: DC
  Administered 2020-03-29 – 2020-03-31 (×4): 100 mg via ORAL
  Filled 2020-03-29 (×5): qty 1

## 2020-03-29 MED ORDER — SODIUM CHLORIDE 0.9 % IR SOLN
Status: DC | PRN
  Administered 2020-03-29: 1000 mL

## 2020-03-29 MED ORDER — WATER FOR IRRIGATION, STERILE IR SOLN
Status: DC | PRN
  Administered 2020-03-29: 2000 mL

## 2020-03-29 MED ORDER — CEFAZOLIN SODIUM-DEXTROSE 2-4 GM/100ML-% IV SOLN
2.0000 g | INTRAVENOUS | Status: AC
  Administered 2020-03-29: 2 g via INTRAVENOUS

## 2020-03-29 MED ORDER — ISOPROPYL ALCOHOL 70 % SOLN
Status: DC | PRN
  Administered 2020-03-29: 1 via TOPICAL

## 2020-03-29 MED ORDER — ACETAMINOPHEN 10 MG/ML IV SOLN
INTRAVENOUS | Status: AC
  Filled 2020-03-29: qty 100

## 2020-03-29 MED ORDER — LIDOCAINE HCL (PF) 2 % IJ SOLN
INTRAMUSCULAR | Status: AC
  Filled 2020-03-29: qty 5

## 2020-03-29 MED ORDER — PROPOFOL 10 MG/ML IV BOLUS
INTRAVENOUS | Status: DC | PRN
Start: 2020-03-29 — End: 2020-03-29
  Administered 2020-03-29: 90 mg via INTRAVENOUS

## 2020-03-29 MED ORDER — PHENYLEPHRINE 40 MCG/ML (10ML) SYRINGE FOR IV PUSH (FOR BLOOD PRESSURE SUPPORT)
PREFILLED_SYRINGE | INTRAVENOUS | Status: DC | PRN
  Administered 2020-03-29: 80 ug via INTRAVENOUS
  Administered 2020-03-29: 160 ug via INTRAVENOUS

## 2020-03-29 MED ORDER — MORPHINE SULFATE (PF) 2 MG/ML IV SOLN
0.5000 mg | INTRAVENOUS | Status: DC | PRN
  Administered 2020-03-30: 13:00:00 1 mg via INTRAVENOUS
  Filled 2020-03-29: qty 1

## 2020-03-29 MED ORDER — POVIDONE-IODINE 10 % EX SWAB: 2.0000 "application " | Freq: Once | CUTANEOUS | Status: DC

## 2020-03-29 MED ORDER — ONDANSETRON HCL 4 MG/2ML IJ SOLN: 4.0000 mg | Freq: Four times a day (QID) | INTRAMUSCULAR | Status: DC | PRN

## 2020-03-29 MED ORDER — DEXAMETHASONE SODIUM PHOSPHATE 10 MG/ML IJ SOLN
INTRAMUSCULAR | Status: AC
  Filled 2020-03-29: qty 1

## 2020-03-29 MED ORDER — FENTANYL CITRATE (PF) 100 MCG/2ML IJ SOLN
INTRAMUSCULAR | Status: DC | PRN
  Administered 2020-03-29: 100 ug via INTRAVENOUS

## 2020-03-29 MED ORDER — SUGAMMADEX SODIUM 200 MG/2ML IV SOLN
INTRAVENOUS | Status: DC | PRN
  Administered 2020-03-29: 100 mg via INTRAVENOUS

## 2020-03-29 MED ORDER — ACETAMINOPHEN 325 MG PO TABS
325.0000 mg | ORAL_TABLET | Freq: Four times a day (QID) | ORAL | Status: DC | PRN
Start: 2020-03-30 — End: 2020-04-01
  Administered 2020-03-31: 650 mg via ORAL
  Filled 2020-03-29: qty 2

## 2020-03-29 MED ORDER — LIDOCAINE 2% (20 MG/ML) 5 ML SYRINGE
INTRAMUSCULAR | Status: DC | PRN
  Administered 2020-03-29: 40 mg via INTRAVENOUS

## 2020-03-29 MED ORDER — ASPIRIN EC 325 MG PO TBEC
325.0000 mg | DELAYED_RELEASE_TABLET | Freq: Every day | ORAL | Status: DC
  Administered 2020-03-30 – 2020-04-01 (×3): 325 mg via ORAL
  Filled 2020-03-29 (×3): qty 1

## 2020-03-29 MED ORDER — FENTANYL CITRATE (PF) 100 MCG/2ML IJ SOLN
INTRAMUSCULAR | Status: AC
  Filled 2020-03-29: qty 2

## 2020-03-29 MED ORDER — MENTHOL 3 MG MT LOZG: 1.0000 | LOZENGE | OROMUCOSAL | Status: DC | PRN

## 2020-03-29 MED ORDER — PHENYLEPHRINE HCL-NACL 10-0.9 MG/250ML-% IV SOLN
INTRAVENOUS | Status: DC | PRN
  Administered 2020-03-29: 40 ug/min via INTRAVENOUS

## 2020-03-29 MED ORDER — LACTATED RINGERS IV SOLN: INTRAVENOUS | Status: DC

## 2020-03-29 MED ORDER — LACTATED RINGERS IV SOLN: INTRAVENOUS | Status: DC | PRN

## 2020-03-29 MED ORDER — ONDANSETRON HCL 4 MG/2ML IJ SOLN: 4.0000 mg | Freq: Once | INTRAMUSCULAR | Status: DC | PRN

## 2020-03-29 MED ORDER — PROPOFOL 10 MG/ML IV BOLUS
INTRAVENOUS | Status: AC
  Filled 2020-03-29: qty 20

## 2020-03-29 MED ORDER — KETOROLAC TROMETHAMINE 30 MG/ML IJ SOLN
INTRAMUSCULAR | Status: AC
  Filled 2020-03-29: qty 1

## 2020-03-29 MED ORDER — HYDROCODONE-ACETAMINOPHEN 5-325 MG PO TABS
1.0000 | ORAL_TABLET | ORAL | Status: DC | PRN
  Administered 2020-03-29 – 2020-03-30 (×3): 1 via ORAL
  Filled 2020-03-29 (×3): qty 1

## 2020-03-29 MED ORDER — CEFAZOLIN SODIUM-DEXTROSE 2-4 GM/100ML-% IV SOLN
INTRAVENOUS | Status: AC
  Filled 2020-03-29: qty 100

## 2020-03-29 MED ORDER — ONDANSETRON HCL 4 MG/2ML IJ SOLN
INTRAMUSCULAR | Status: AC
  Filled 2020-03-29: qty 2

## 2020-03-29 MED ORDER — METOCLOPRAMIDE HCL 5 MG/ML IJ SOLN: 5.0000 mg | Freq: Three times a day (TID) | INTRAMUSCULAR | Status: DC | PRN

## 2020-03-29 MED ORDER — ONDANSETRON HCL 4 MG/2ML IJ SOLN
INTRAMUSCULAR | Status: DC | PRN
  Administered 2020-03-29: 4 mg via INTRAVENOUS

## 2020-03-29 MED ORDER — CHLORHEXIDINE GLUCONATE 4 % EX LIQD: 60.0000 mL | Freq: Once | CUTANEOUS | Status: DC

## 2020-03-29 MED ORDER — ROCURONIUM BROMIDE 10 MG/ML (PF) SYRINGE
PREFILLED_SYRINGE | INTRAVENOUS | Status: DC | PRN
  Administered 2020-03-29: 50 mg via INTRAVENOUS

## 2020-03-29 MED ORDER — DEXAMETHASONE SODIUM PHOSPHATE 10 MG/ML IJ SOLN
INTRAMUSCULAR | Status: DC | PRN
  Administered 2020-03-29: 4 mg via INTRAVENOUS

## 2020-03-29 MED ORDER — KETOROLAC TROMETHAMINE 30 MG/ML IJ SOLN
INTRAMUSCULAR | Status: DC | PRN
  Administered 2020-03-29: 30 mg

## 2020-03-29 MED ORDER — ACETAMINOPHEN 10 MG/ML IV SOLN
INTRAVENOUS | Status: DC | PRN
  Administered 2020-03-29: 1000 mg via INTRAVENOUS

## 2020-03-29 MED ORDER — TRANEXAMIC ACID-NACL 1000-0.7 MG/100ML-% IV SOLN
1000.0000 mg | INTRAVENOUS | Status: AC
  Administered 2020-03-29: 1000 mg via INTRAVENOUS

## 2020-03-29 MED ORDER — PHENOL 1.4 % MT LIQD
1.0000 | OROMUCOSAL | Status: DC | PRN
  Filled 2020-03-29: qty 177

## 2020-03-29 MED ORDER — ONDANSETRON HCL 4 MG PO TABS: 4.0000 mg | ORAL_TABLET | Freq: Four times a day (QID) | ORAL | Status: DC | PRN

## 2020-03-29 MED ORDER — METOCLOPRAMIDE HCL 5 MG PO TABS: 5.0000 mg | ORAL_TABLET | Freq: Three times a day (TID) | ORAL | Status: DC | PRN

## 2020-03-29 MED ORDER — CEFAZOLIN SODIUM-DEXTROSE 2-4 GM/100ML-% IV SOLN
2.0000 g | Freq: Four times a day (QID) | INTRAVENOUS | Status: AC
  Administered 2020-03-29 – 2020-03-30 (×2): 2 g via INTRAVENOUS
  Filled 2020-03-29 (×2): qty 100

## 2020-03-29 MED ORDER — BUPIVACAINE-EPINEPHRINE 0.25% -1:200000 IJ SOLN
INTRAMUSCULAR | Status: DC | PRN
  Administered 2020-03-29: 30 mL

## 2020-03-29 MED ORDER — BUPIVACAINE-EPINEPHRINE (PF) 0.25% -1:200000 IJ SOLN
INTRAMUSCULAR | Status: AC
  Filled 2020-03-29: qty 30

## 2020-03-29 SURGICAL SUPPLY — 63 items
ADH SKN CLS APL DERMABOND .7 (GAUZE/BANDAGES/DRESSINGS) ×1
APL PRP STRL LF DISP 70% ISPRP (MISCELLANEOUS) ×1
BAG DECANTER FOR FLEXI CONT (MISCELLANEOUS) IMPLANT
BAG SPEC THK2 15X12 ZIP CLS (MISCELLANEOUS)
BAG ZIPLOCK 12X15 (MISCELLANEOUS) IMPLANT
BLADE SURG SZ10 CARB STEEL (BLADE) IMPLANT
CHLORAPREP W/TINT 26 (MISCELLANEOUS) ×2 IMPLANT
COVER PERINEAL POST (MISCELLANEOUS) ×2 IMPLANT
COVER SURGICAL LIGHT HANDLE (MISCELLANEOUS) ×2 IMPLANT
COVER WAND RF STERILE (DRAPES) IMPLANT
CUP ACET PINNACLE SECTR 48MM (Joint) ×1 IMPLANT
DECANTER SPIKE VIAL GLASS SM (MISCELLANEOUS) ×2 IMPLANT
DERMABOND ADVANCED (GAUZE/BANDAGES/DRESSINGS) ×1
DERMABOND ADVANCED .7 DNX12 (GAUZE/BANDAGES/DRESSINGS) ×1 IMPLANT
DRAPE IMP U-DRAPE 54X76 (DRAPES) ×2 IMPLANT
DRAPE SHEET LG 3/4 BI-LAMINATE (DRAPES) ×6 IMPLANT
DRAPE STERI IOBAN 125X83 (DRAPES) IMPLANT
DRAPE U-SHAPE 47X51 STRL (DRAPES) ×4 IMPLANT
DRSG AQUACEL AG ADV 3.5X10 (GAUZE/BANDAGES/DRESSINGS) ×2 IMPLANT
ELECT REM PT RETURN 15FT ADLT (MISCELLANEOUS) ×2 IMPLANT
GAUZE SPONGE 4X4 12PLY STRL (GAUZE/BANDAGES/DRESSINGS) ×2 IMPLANT
GLOVE SRG 8 PF TXTR STRL LF DI (GLOVE) ×1 IMPLANT
GLOVE SURG ENC MOIS LTX SZ8.5 (GLOVE) ×4 IMPLANT
GLOVE SURG ENC TEXT LTX SZ7.5 (GLOVE) ×4 IMPLANT
GLOVE SURG UNDER POLY LF SZ8 (GLOVE) ×2
GLOVE SURG UNDER POLY LF SZ8.5 (GLOVE) ×2 IMPLANT
GOWN SPEC L3 XXLG W/TWL (GOWN DISPOSABLE) ×2 IMPLANT
GOWN STRL REUS W/ TWL LRG LVL3 (GOWN DISPOSABLE) ×1 IMPLANT
GOWN STRL REUS W/TWL LRG LVL3 (GOWN DISPOSABLE) ×2
HANDPIECE INTERPULSE COAX TIP (DISPOSABLE) ×2
HEAD FEM STD 32X+9 STRL (Hips) ×2 IMPLANT
HOLDER FOLEY CATH W/STRAP (MISCELLANEOUS) ×2 IMPLANT
HOOD PEEL AWAY FLYTE STAYCOOL (MISCELLANEOUS) ×8 IMPLANT
JET LAVAGE IRRISEPT WOUND (IRRIGATION / IRRIGATOR) ×2
KIT TURNOVER KIT A (KITS) ×2 IMPLANT
LAVAGE JET IRRISEPT WOUND (IRRIGATION / IRRIGATOR) ×1 IMPLANT
LINER ACET 32X48 (Liner) ×2 IMPLANT
MANIFOLD NEPTUNE II (INSTRUMENTS) ×2 IMPLANT
MARKER SKIN DUAL TIP RULER LAB (MISCELLANEOUS) ×2 IMPLANT
NDL SAFETY ECLIPSE 18X1.5 (NEEDLE) ×1 IMPLANT
NEEDLE HYPO 18GX1.5 SHARP (NEEDLE) ×2
NEEDLE SPNL 18GX3.5 QUINCKE PK (NEEDLE) ×2 IMPLANT
PACK ANTERIOR HIP CUSTOM (KITS) ×2 IMPLANT
PENCIL SMOKE EVACUATOR (MISCELLANEOUS) IMPLANT
PINNSECTOR W/GRIP ACE CUP 48MM (Joint) ×2 IMPLANT
SAW OSC TIP CART 19.5X105X1.3 (SAW) ×2 IMPLANT
SEALER BIPOLAR AQUA 6.0 (INSTRUMENTS) ×2 IMPLANT
SET HNDPC FAN SPRY TIP SCT (DISPOSABLE) ×1 IMPLANT
STEM TRI LOC BPS GRIPTON SZ 5 (Hips) ×1 IMPLANT
SUT ETHIBOND NAB CT1 #1 30IN (SUTURE) ×4 IMPLANT
SUT MNCRL AB 3-0 PS2 18 (SUTURE) ×2 IMPLANT
SUT MNCRL AB 4-0 PS2 18 (SUTURE) ×2 IMPLANT
SUT MON AB 2-0 CT1 36 (SUTURE) ×4 IMPLANT
SUT STRATAFIX PDO 1 14 VIOLET (SUTURE) ×2
SUT STRATFX PDO 1 14 VIOLET (SUTURE) ×1
SUT VIC AB 2-0 CT1 27 (SUTURE) ×2
SUT VIC AB 2-0 CT1 TAPERPNT 27 (SUTURE) ×1 IMPLANT
SUTURE STRATFX PDO 1 14 VIOLET (SUTURE) ×1 IMPLANT
SYR 3ML LL SCALE MARK (SYRINGE) ×2 IMPLANT
TRAY FOLEY MTR SLVR 16FR STAT (SET/KITS/TRAYS/PACK) IMPLANT
TRI LOC BPS W GRIPTON SZ 5 (Hips) ×2 IMPLANT
TUBE SUCTION HIGH CAP CLEAR NV (SUCTIONS) ×2 IMPLANT
WATER STERILE IRR 1000ML POUR (IV SOLUTION) ×2 IMPLANT

## 2020-03-29 NOTE — Discharge Instructions (Signed)
°Dr. Holly Pring °Joint Replacement Specialist °[facility] °[address] °[place], [place] [address] °[phone] ° ° °TOTAL HIP REPLACEMENT POSTOPERATIVE DIRECTIONS ° ° ° °Hip Rehabilitation, Guidelines Following Surgery  ° °WEIGHT BEARING °Weight bearing as tolerated with assist device (walker, cane, etc) as directed, use it as long as suggested by your surgeon or therapist, typically at least 4-6 weeks. ° °The results of a hip operation are greatly improved after range of motion and muscle strengthening exercises. Follow all safety measures which are given to protect your hip. If any of these exercises cause increased pain or swelling in your joint, decrease the amount until you are comfortable again. Then slowly increase the exercises. Call your caregiver if you have problems or questions.  ° °HOME CARE INSTRUCTIONS  °Most of the following instructions are designed to prevent the dislocation of your new hip.  °Remove items at home which could result in a fall. This includes throw rugs or furniture in walking pathways.  °Continue medications as instructed at time of discharge. °· You may have some home medications which will be placed on hold until you complete the course of blood thinner medication. °· You may start showering once you are discharged home. Do not remove your dressing. °Do not put on socks or shoes without following the instructions of your caregivers.   °Sit on chairs with arms. Use the chair arms to help push yourself up when arising.  °Arrange for the use of a toilet seat elevator so you are not sitting low.  °· Walk with walker as instructed.  °You may resume a sexual relationship in one month or when given the OK by your caregiver.  °Use walker as long as suggested by your caregivers.  °You may put full weight on your legs and walk as much as is comfortable. °Avoid periods of inactivity such as sitting longer than an hour when not asleep. This helps prevent  blood clots.  °You may return to work once you are cleared by your surgeon.  °Do not drive a car for 6 weeks or until released by your surgeon.  °Do not drive while taking narcotics.  °Wear elastic stockings for two weeks following surgery during the day but you may remove then at night.  °Make sure you keep all of your appointments after your operation with all of your doctors and caregivers. You should call the office at the above phone number and make an appointment for approximately two weeks after the date of your surgery. °Please pick up a stool softener and laxative for home use as long as you are requiring pain medications. °· ICE to the affected hip every three hours for 30 minutes at a time and then as needed for pain and swelling. Continue to use ice on the hip for pain and swelling from surgery. You may notice swelling that will progress down to the foot and ankle.  This is normal after surgery.  Elevate the leg when you are not up walking on it.   °It is important for you to complete the blood thinner medication as prescribed by your doctor. °· Continue to use the breathing machine which will help keep your temperature down.  It is common for your temperature to cycle up and down following surgery, especially at night when you are not up moving around and exerting yourself.  The breathing machine keeps your lungs expanded and your temperature down. ° °RANGE OF MOTION AND STRENGTHENING EXERCISES  °These exercises are   designed to help you keep full movement of your hip joint. Follow your caregiver's or physical therapist's instructions. Perform all exercises about fifteen times, three times per day or as directed. Exercise both hips, even if you have had only one joint replacement. These exercises can be done on a training (exercise) mat, on the floor, on a table or on a bed. Use whatever works the best and is most comfortable for you. Use music or television while you are exercising so that the exercises  are a pleasant break in your day. This will make your life better with the exercises acting as a break in routine you can look forward to.  °Lying on your back, slowly slide your foot toward your buttocks, raising your knee up off the floor. Then slowly slide your foot back down until your leg is straight again.  °Lying on your back spread your legs as far apart as you can without causing discomfort.  °Lying on your side, raise your upper leg and foot straight up from the floor as far as is comfortable. Slowly lower the leg and repeat.  °Lying on your back, tighten up the muscle in the front of your thigh (quadriceps muscles). You can do this by keeping your leg straight and trying to raise your heel off the floor. This helps strengthen the largest muscle supporting your knee.  °Lying on your back, tighten up the muscles of your buttocks both with the legs straight and with the knee bent at a comfortable angle while keeping your heel on the floor.  ° °SKILLED REHAB INSTRUCTIONS: °If the patient is transferred to a skilled rehab facility following release from the hospital, a list of the current medications will be sent to the facility for the patient to continue.  When discharged from the skilled rehab facility, please have the facility set up the patient's Home Health Physical Therapy prior to being released. Also, the skilled facility will be responsible for providing the patient with their medications at time of release from the facility to include their pain medication and their blood thinner medication. If the patient is still at the rehab facility at time of the two week follow up appointment, the skilled rehab facility will also need to assist the patient in arranging follow up appointment in our office and any transportation needs. ° °MAKE SURE YOU:  °Understand these instructions.  °Will watch your condition.  °Will get help right away if you are not doing well or get worse. ° °Pick up stool softner and  laxative for home use following surgery while on pain medications. °Do not remove your dressing. °The dressing is waterproof--it is OK to take showers. °Continue to use ice for pain and swelling after surgery. °Do not use any lotions or creams on the incision until instructed by your surgeon. °Total Hip Protocol. ° ° °

## 2020-03-29 NOTE — Transfer of Care (Signed)
Immediate Anesthesia Transfer of Care Note  Patient: [name]  Procedure(s) Performed: TOTAL HIP ARTHROPLASTY ANTERIOR APPROACH (Left Hip)  Patient Location: PACU  Anesthesia Type:General  Level of Consciousness: drowsy and patient cooperative  Airway & Oxygen Therapy: Patient Spontanous Breathing and Patient connected to face mask oxygen  Post-op Assessment: Report given to RN and Post -op Vital signs reviewed and stable  Post vital signs: Reviewed and stable  Last Vitals:  Vitals Value Taken Time  BP 134/75 03/29/20 1453  Temp    Pulse 85 03/29/20 1457  Resp 15 03/29/20 1457  SpO2 100 % 03/29/20 1457  Vitals shown include unvalidated device data.  Last Pain:  Vitals:   03/29/20 0744  TempSrc:   PainSc: 0-No pain      Patients Stated Pain Goal: 5 (03/28/20 1628)  Complications: No complications documented.

## 2020-03-29 NOTE — Op Note (Addendum)
OPERATIVE REPORT  SURGEON: Samson Frederic, MD   ASSISTANT: Barrie Dunker, PA-C  PREOPERATIVE DIAGNOSIS: Displaced Left femoral neck fracture.   POSTOPERATIVE DIAGNOSIS: Displaced Left femoral neck fracture.   PROCEDURE: Left total hip arthroplasty, anterior approach.   IMPLANTS: DePuy Tri Lock stem, size 5, hi offset. DePuy Pinnacle Cup, size 48 mm. DePuy Altrx liner, size 32 by 48 mm, neutral. DePuy metal head ball, size 32 + 9 mm.  ANESTHESIA:  General  ANTIBIOTICS: 2g ancef.  ESTIMATED BLOOD LOSS:-50 mL    DRAINS: None.  COMPLICATIONS: None   CONDITION: PACU - hemodynamically stable.   BRIEF CLINICAL NOTE: [name] is a 80 y.o. female with a displaced Left femoral neck fracture. The patient was admitted to the hospitalist service and underwent perioperative risk stratification and medical optimization. The risks, benefits, and alternatives to total hip arthroplasty were explained, and the patient elected to proceed.  PROCEDURE IN DETAIL: The patient was taken to the operating room and general anesthesia was induced on the hospital bed.  The patient was then positioned on the Hana table.  All bony prominences were well padded.  The hip was prepped and draped in the normal sterile surgical fashion.  A time-out was called verifying side and site of surgery. Antibiotics were given within 60 minutes of beginning the procedure.  The direct anterior approach to the hip was performed through the Hueter interval.  Lateral femoral circumflex vessels were treated with the Auqumantys. The anterior capsule was exposed and an inverted T capsulotomy was made.  Fracture hematoma was encountered and evacuated. The patient was found to have a comminuted Left subcapital femoral neck fracture.  I freshened the femoral neck cut with a saw.  I removed the femoral neck fragment.  A corkscrew was placed into the head and the head was removed.  This was passed to the back table and was  measured.   Acetabular exposure was achieved, and the pulvinar and labrum were excised. Sequential reaming of the acetabulum was then performed up to a size 47 mm reamer. A 48 mm cup was then opened and impacted into place at approximately 40 degrees of abduction and 20 degrees of anteversion. The final polyethylene liner was impacted into place.    I then gained femoral exposure taking care to protect the abductors and greater trochanter.  This was performed using standard external rotation, extension, and adduction.  The capsule was peeled off the inner aspect of the greater trochanter, taking care to preserve the short external rotators. A cookie cutter was used to enter the femoral canal, and then the femoral canal finder was used to confirm location.  I then sequentially broached up to a size 5.  Calcar planer was used on the femoral neck remnant.  I paced a hi neck and a trial head ball. The hip was reduced.  Leg lengths were checked fluoroscopically.  The hip was dislocated and trial components were removed.  I placed the real stem followed by the real head ball.  The hip was reduced.  Fluoroscopy was used to confirm component position and leg lengths.  At 90 degrees of external rotation and extension, the hip was stable to an anterior directed force.   The wound was copiously irrigated with Irrisept solution and normal saline using pule lavage.  Marcaine solution was injected into the periarticular soft tissue.  The wound was closed in layers using #1 Vicryl and V-Loc for the fascia, 2-0 Vicryl for the subcutaneous fat, 2-0 Monocryl for  the deep dermal layer, and staples + glue for the skin.  Once the glue was fully dried, an Aquacell Ag dressing was applied.  The patient was then awakened from anesthesia and transported to the recovery room in stable condition.  Sponge, needle, and instrument counts were correct at the end of the case x2.  The patient tolerated the procedure well and there were no  known complications.  Please note that a surgical assistant was a medical necessity for this procedure to perform it in a safe and expeditious manner. Assistant was necessary to provide appropriate retraction of vital neurovascular structures, to prevent femoral fracture, and to allow for anatomic placement of the prosthesis.

## 2020-03-29 NOTE — Plan of Care (Signed)
  Problem: Pain Managment: Goal: General experience of comfort will improve Outcome: Progressing   

## 2020-03-29 NOTE — Interval H&P Note (Signed)
History and Physical Interval Note:  03/29/2020 1:15 PM  [name]  has presented today for surgery, with the diagnosis of LEFT FEMORAL NECK FRACTURE.  The various methods of treatment have been discussed with the patient and family. After consideration of risks, benefits and other options for treatment, the patient has consented to  Procedure(s): TOTAL HIP ARTHROPLASTY ANTERIOR APPROACH (Left) as a surgical intervention.  The patient's history has been reviewed, patient examined, no change in status, stable for surgery.  I have reviewed the patient's chart and labs.  Questions were answered to the patient's satisfaction.    The risks, benefits, and alternatives were discussed with the patient. There are risks associated with the surgery including, but not limited to, problems with anesthesia (death), infection, instability (giving out of the joint), dislocation, differences in leg length/angulation/rotation, fracture of bones, loosening or failure of implants, hematoma (blood accumulation) which may require surgical drainage, blood clots, pulmonary embolism, nerve injury (foot drop and lateral thigh numbness), and blood vessel injury. The patient understands these risks and elects to proceed.    Iline Oven Kaceton Vieau

## 2020-03-29 NOTE — Anesthesia Preprocedure Evaluation (Addendum)
Anesthesia Evaluation  Patient identified by MRN, date of birth, ID band Patient awake    Reviewed: Allergy & Precautions, NPO status , Patient's Chart, lab work & pertinent test results  Airway Mallampati: II  TM Distance: >3 FB Neck ROM: Full    Dental   Pulmonary Current Smoker,    Pulmonary exam normal        Cardiovascular hypertension, Pt. on medications and Pt. on home beta blockers + CAD and +CHF   Rhythm:Regular Rate:Normal     Neuro/Psych Seizures -, Well Controlled,  Dementia CVA, Residual Symptoms    GI/Hepatic negative GI ROS, Neg liver ROS,   Endo/Other  negative endocrine ROS  Renal/GU CRFRenal disease  negative genitourinary   Musculoskeletal Left femoral neck fx   Abdominal (+)  Abdomen: soft. Bowel sounds: normal.  Peds  Hematology  (+) anemia ,   Anesthesia Other Findings   Reproductive/Obstetrics                            Anesthesia Physical Anesthesia Plan  ASA: III  Anesthesia Plan: General   Post-op Pain Management:    Induction: Intravenous  PONV Risk Score and Plan: 2 and Ondansetron and Treatment may vary due to age or medical condition  Airway Management Planned: Mask and Oral ETT  Additional Equipment: None  Intra-op Plan:   Post-operative Plan: Extubation in OR  Informed Consent: I have reviewed the patients History and Physical, chart, labs and discussed the procedure including the risks, benefits and alternatives for the proposed anesthesia with the patient or authorized representative who has indicated his/her understanding and acceptance.     Dental advisory given  Plan Discussed with: CRNA  Anesthesia Plan Comments: (Lab Results      Component                Value               Date                      WBC                      9.0                 03/29/2020                HGB                      11.3 (L)            03/29/2020                 HCT                      34.0 (L)            03/29/2020                MCV                      97.4                03/29/2020                PLT                      211  03/29/2020           Lab Results      Component                Value               Date                      NA                       136                 03/29/2020                K                        4.8                 03/29/2020                CO2                      23                  03/29/2020                GLUCOSE                  116 (H)             03/29/2020                BUN                      41 (H)              03/29/2020                CREATININE               1.27 (H)            03/29/2020                CALCIUM                  9.7                 03/29/2020                GFRNONAA                 44 (L)              03/29/2020                GFRAA                    38 (L)              07/23/2018          )       Anesthesia Quick Evaluation

## 2020-03-29 NOTE — Progress Notes (Signed)
PROGRESS NOTE    [name]  ZOX:[id number] DOB: [birth year] DOA: 03/26/2020 PCP: [name], MD   Brief Narrative:  The patient is a 80 year old extremely thin African-American female with a past medical history significant for but not limited to dementia, chronic combined systolic and diastolic CHF, nonobstructive CAD, CKD stage IIIb, history of a CVA, seizure disorder, hypertension as well as other comorbidities presented to the emergency room for evaluation after a fall.  She is found on the floor next to her bed at her home and because of her dementia she is unable to recall what happened.  She denies any pain but when her left leg was moved she grimaced in pain.  She has been living with her son recently was seen in the emergency department 2 days ago for transient speech disturbance but had a head CT that day with no acute findings and returned to baseline and was sent home.  In the ED is worked up and found to have an elevated blood pressure, EKG showed sinus tachycardia, and chest x-ray was notable for cardiomegaly with questionable pulmonary venous hypertension but no frank edema noted.  X-rays of the knee were negative and there is no acute cervical spine fracture or subluxation noted on CT scan.  CT of the head demonstrated mild cerebral atrophy and changes of microvascular disease without acute findings.  Radiographs of the hip and pelvis demonstrate acute proximal left femur fracture.  In the ED she was treated with IV morphine and Zofran and orthopedic surgery was consulted for further evaluation.  **Interim History Orthopedic Surgery planning Surgical Intervention Sunday 03/29/20. Labs remain relatively stable and she had a Dietary Tray this AM but had not touched it and has not had anything since midnight last night. She states she does not feel well today but does not elaborate and denies any Nausea or vomiting.   Assessment & Plan:   Principal Problem:   Closed fracture  of left hip (HCC) Active Problems:   Seizure disorder (HCC)   H/O: CVA (cerebrovascular accident)   Chronic combined systolic and diastolic congestive heart failure (HCC)   Chronic kidney disease, stage 3b (HCC)   Alzheimer's dementia without behavioral disturbance (HCC)   SIRS (systemic inflammatory response syndrome) (HCC)  Acute Left Hip/Femur Fracture -Presents with left hip pain and deformity after being found on the floor beside her bed and is found to have acute proximal left femur fracture -Orthopedic surgery consulting and much appreciatedand Dr. Linna Caprice planning on surgical intervention on Sunday, 03/29/2020 with n.p.o. at midnight Saturday into Sunday -Based on the available data, Mrs. Betten presents an estimated 2.7% risk of perioperative MI or cardiac arrest -She was initially kept NPO but since she is going to have surgery on Sunday we will give her diet today, hold ASA and ARB, continue pain-control and supportive care; Was supposed to be NPO after midnight but Diet order was still in. Thankfully she did not eat anything and tray was removed. Remains NPO for Surgical Procedure this AM  -Ensure good bowel regimen. -Continues to have Pain when she moves: Pain control with IV Morphine 1-2 mg q3hprn Moderate Pain, Severe Pain -C/w Antiemetics with IV Ondansetron 4 mg q6hprn Nausea/Vomiting   SIRS -Leukocytosis and mild tachycardia noted on admission without fever or apparent infectious process -Likely reactive, continue pain-control, supportive care, and culture if febrile -WBC went from 16.0 -> 12.4 -> 9.3 -> 9.0 -Heart rate remains a little bit elevated in the mid 100s but this  AM was documented to be 95 and HR's have improvedd -Of note respiratory panel by PCR with influenza AMB was negative along with SARS coronavirus 2 being negative -Continue to monitor temperature curve and WBC and repeat in a.m.  Chronic combined systolic and diastolicic CHF -Appears  hypovolemic on admission -EF was 30-35% in September 2021 -Hold ARB perioperatively, continue Carvedilol 12.5 mg po BID, judicious IVFin accordance not to volume overload -Patient is -873.9 mL since admission -Strict I's and O's and daily weights -Continue to Monitor Volume Status and for S/Sx of Volume Overload   CKD IIIb Metabolic Acidosis -SCr is 1.52 on admission, close to apparent baseline and has improved and BUN/creatinine is now trending down further from 25/1.35 -> 25/1.13 -> 41/1.27 -Has a mild Acidosis with a CO2 of 23, AG of 8, and Chloride Level of 105 -Not on Anymore IVF given Hx of Heart Failure -Continue to avoid nephrotoxic medications, contrast dyes, hypotension and renally adjust and dose medications -Trend trend and repeat CMP in a.m.  HTN -C/w Amlodipine 10 mg po Daily, and Carvedilol 12.5 mg po BID -C/w Labetalol 10 mg IV q6hprn SBP >180 or DBP > 100 -Continue to Monitor BP per Protocol -Last BP was 176/97  Normocytic anemia/anemia of chronic kidney disease -Patient's hemoglobin/hematocrit went from 12.4/37.5 ->11.8/35.66 -> 11.1/33.4 -> 11.3/34.0 -Check anemia panel ordered an iron level of 61, U IBC 183, TIBC of 244, saturation ratios of 25%, ferritin level 661, folate level 22.4, vitamin B12 601 -Continue monitor for signs and symptoms of bleeding; currently no overt bleeding noted -Expect a postoperative drop -Repeat CBC in a.m.  Seizures -Continue Levetiracetam 1000 mg po BID and Lacosamide 200 mg po BID   Dementia -Continue Donepezil 5 mg po Daily  -Delirium precautions  Hypermagnesemia -Mild -Patient is magnesium level is now 2.7 -Continue to monitor and trend and repeat magnesium level in the a.m.  History of CVA -Hold ASA prior to surgery  Underweight/Severe Malnutrition in the Context of Chronic Illness -Nutritionist consulted for further evaluation and recommendations -C/w Ensure Surgery po BID and MVI + Minerals Daily    DVT prophylaxis: SCDs Code Status: FULL CODE Family Communication: No family present at bedside  Disposition Plan: Pending further surgical intervention and clearance by Orthopedic Surgery  Status is: Inpatient  Remains inpatient appropriate because:Unsafe d/c plan, IV treatments appropriate due to intensity of illness or inability to take PO and Inpatient level of care appropriate due to severity of illness   Dispo: The patient is from: Home              Anticipated d/c is to: SNF              Patient currently is not medically stable to d/c.   Difficult to place patient No  Consultants:   Orthopedic Surgery    Procedures: None  Antimicrobials:  Anti-infectives (From admission, onward)   None        Subjective: Seen and examined at bedside and was more withdrawn and states she does not feel well but does not elaborate. States that she slept ok. No CP or SOB. No Nausea or vomiting. No other concerns or complaints at this time.   Objective: Vitals:   03/28/20 1139 03/28/20 1335 03/28/20 2115 03/29/20 0535  BP: (!) 139/91 132/85 129/83 (!) 176/97  Pulse: 93 95 96 95  Resp: 16 16 15 16   Temp: 98.6 F (37 C) 98.6 F (37 C) 98.5 F (36.9 C) 97.6 F (36.4 C)  TempSrc: Oral Oral Oral Oral  SpO2: 100% 99% 100% 95%  Weight:      Height:        Intake/Output Summary (Last 24 hours) at 03/29/2020 0816 Last data filed at 03/29/2020 0600 Gross per 24 hour  Intake 220 ml  Output 950 ml  Net -730 ml   Filed Weights   03/26/20 2229  Weight: 38 kg   Examination: Physical Exam:  Constitutional: Patient is a thin elderly AAF currently in NAD appears calm but a little uncomfortable Eyes: Lids and conjunctivae normal, sclerae anicteric  ENMT: External Ears, Nose appear normal. Grossly normal hearing.  Neck: Appears normal, supple, no cervical masses, normal ROM, no appreciable thyromegaly; no JVD Respiratory: Diminished to auscultation bilaterally, no wheezing, rales,  rhonchi or crackles. Normal respiratory effort and patient is not tachypenic. No accessory muscle use.  Unlabored breathing and not wearing any supplemental oxygen via nasal cannula Cardiovascular: RRR, no murmurs / rubs / gallops. S1 and S2 auscultated. No extremity edema.  Abdomen: Soft, non-tender, non-distended. Bowel sounds positive.  GU: Deferred. Musculoskeletal: No clubbing / cyanosis of digits/nails.  Left leg is shortened and externally rotated compared to the right Skin: No rashes, lesions, ulcers on a limited skin evaluation. No induration; Warm and dry.  Neurologic: CN 2-12 grossly intact with no focal deficits. Romberg sign and cerebellar reflexes not assessed.  Psychiatric: Slightly impaired judgment and insight. She is awake and Alert. Normal mood and appropriate affect.    Data Reviewed: I have personally reviewed following labs and imaging studies  CBC: Recent Labs  Lab 03/25/20 1450 03/27/20 0033 03/27/20 0532 03/28/20 0359 03/29/20 0455  WBC 7.2 16.0* 12.4* 9.3 9.0  NEUTROABS 5.1  --   --  7.0 6.4  HGB 10.7* 12.4 11.8* 11.1* 11.3*  HCT 32.5* 37.5 35.6* 33.4* 34.0*  MCV 99.4 97.2 98.3 97.7 97.4  PLT 309 311 280 239 211   Basic Metabolic Panel: Recent Labs  Lab 03/25/20 1233 03/25/20 1300 03/27/20 0033 03/27/20 0532 03/28/20 0359 03/29/20 0455  NA 139 138 138 137 135 136  K 5.3* 6.5* 5.1 4.3 4.4 4.8  CL 106 109 108 106 106 105  CO2 21*  --  19* 20* 20* 23  GLUCOSE 107* 102* 147* 128* 114* 116*  BUN 29* 42* 27* 25* 25* 41*  CREATININE 1.42* 1.40* 1.52* 1.35* 1.13* 1.27*  CALCIUM 9.9  --  9.8 9.5 9.4 9.7  MG  --   --   --   --  1.8 2.7*  PHOS  --   --   --   --  3.1 2.5   GFR: Estimated Creatinine Clearance: 22.3 mL/min (A) (by C-G formula based on SCr of 1.27 mg/dL (H)). Liver Function Tests: Recent Labs  Lab 03/25/20 1233 03/28/20 0359 03/29/20 0455  AST 27 16 16   ALT 19 17 15   ALKPHOS 78 64 58  BILITOT 0.8 0.8 0.6  PROT 9.0* 7.4 7.5   ALBUMIN 4.3 3.5 3.6   No results for input(s): LIPASE, AMYLASE in the last 168 hours. No results for input(s): AMMONIA in the last 168 hours. Coagulation Profile: Recent Labs  Lab 03/25/20 1233 03/25/20 1450 03/27/20 0033  INR QUESTIONABLE RESULT/NEW SPEC REQ 0.9 0.9   Cardiac Enzymes: No results for input(s): CKTOTAL, CKMB, CKMBINDEX, TROPONINI in the last 168 hours. BNP (last 3 results) No results for input(s): PROBNP in the last 8760 hours. HbA1C: No results for input(s): HGBA1C in the last 72 hours. CBG: Recent Labs  Lab 03/25/20 1234  GLUCAP 95   Lipid Profile: No results for input(s): CHOL, HDL, LDLCALC, TRIG, CHOLHDL, LDLDIRECT in the last 72 hours. Thyroid Function Tests: No results for input(s): TSH, T4TOTAL, FREET4, T3FREE, THYROIDAB in the last 72 hours. Anemia Panel: Recent Labs    03/28/20 0359 03/28/20 1031  VITAMINB12  --  601  FOLATE  --  22.4  FERRITIN  --  661*  TIBC  --  244*  IRON  --  61  RETICCTPCT 1.2  --    Sepsis Labs: No results for input(s): PROCALCITON, LATICACIDVEN in the last 168 hours.  Recent Results (from the past 240 hour(s))  Resp Panel by RT-PCR (Flu A&B, Covid) Nasopharyngeal Swab     Status: None   Collection Time: 03/25/20 12:33 PM   Specimen: Nasopharyngeal Swab; Nasopharyngeal(NP) swabs in vial transport medium  Result Value Ref Range Status   SARS Coronavirus 2 by RT PCR NEGATIVE NEGATIVE Final    Comment: (NOTE) SARS-CoV-2 target nucleic acids are NOT DETECTED.  The SARS-CoV-2 RNA is generally detectable in upper respiratory specimens during the acute phase of infection. The lowest concentration of SARS-CoV-2 viral copies this assay can detect is 138 copies/mL. A negative result does not preclude SARS-Cov-2 infection and should not be used as the sole basis for treatment or other patient management decisions. A negative result may occur with  improper specimen collection/handling, submission of specimen other than  nasopharyngeal swab, presence of viral mutation(s) within the areas targeted by this assay, and inadequate number of viral copies(<138 copies/mL). A negative result must be combined with clinical observations, patient history, and epidemiological information. The expected result is Negative.  Fact Sheet for Patients:  BloggerCourse.com  Fact Sheet for Healthcare Providers:  SeriousBroker.it  This test is no t yet approved or cleared by the [place] FDA and  has been authorized for detection and/or diagnosis of SARS-CoV-2 by FDA under an Emergency Use Authorization (EUA). This EUA will remain  in effect (meaning this test can be used) for the duration of the COVID-19 declaration under Section 564(b)(1) of the Act, 21 U.S.C.section 360bbb-3(b)(1), unless the authorization is terminated  or revoked sooner.       Influenza A by PCR NEGATIVE NEGATIVE Final   Influenza B by PCR NEGATIVE NEGATIVE Final    Comment: (NOTE) The Xpert Xpress SARS-CoV-2/FLU/RSV plus assay is intended as an aid in the diagnosis of influenza from Nasopharyngeal swab specimens and should not be used as a sole basis for treatment. Nasal washings and aspirates are unacceptable for Xpert Xpress SARS-CoV-2/FLU/RSV testing.  Fact Sheet for Patients: BloggerCourse.com  Fact Sheet for Healthcare Providers: SeriousBroker.it  This test is not yet approved or cleared by the [place] FDA and has been authorized for detection and/or diagnosis of SARS-CoV-2 by FDA under an Emergency Use Authorization (EUA). This EUA will remain in effect (meaning this test can be used) for the duration of the COVID-19 declaration under Section 564(b)(1) of the Act, 21 U.S.C. section 360bbb-3(b)(1), unless the authorization is terminated or revoked.  Performed at [facility], [address], [place], [place] [address]   Resp Panel by RT-PCR (Flu A&B, Covid) Nasopharyngeal Swab     Status: None   Collection Time: 03/27/20  2:10 AM   Specimen: Nasopharyngeal Swab; Nasopharyngeal(NP) swabs in vial transport medium  Result Value Ref Range Status   SARS Coronavirus 2 by RT PCR NEGATIVE NEGATIVE Final    Comment: (NOTE) SARS-CoV-2 target nucleic acids are  NOT DETECTED.  The SARS-CoV-2 RNA is generally detectable in upper respiratory specimens during the acute phase of infection. The lowest concentration of SARS-CoV-2 viral copies this assay can detect is 138 copies/mL. A negative result does not preclude SARS-Cov-2 infection and should not be used as the sole basis for treatment or other patient management decisions. A negative result may occur with  improper specimen collection/handling, submission of specimen other than nasopharyngeal swab, presence of viral mutation(s) within the areas targeted by this assay, and inadequate number of viral copies(<138 copies/mL). A negative result must be combined with clinical observations, patient history, and epidemiological information. The expected result is Negative.  Fact Sheet for Patients:  BloggerCourse.com  Fact Sheet for Healthcare Providers:  SeriousBroker.it  This test is no t yet approved or cleared by the [place] FDA and  has been authorized for detection and/or diagnosis of SARS-CoV-2 by FDA under an Emergency Use Authorization (EUA). This EUA will remain  in effect (meaning this test can be used) for the duration of the COVID-19 declaration under Section 564(b)(1) of the Act, 21 U.S.C.section 360bbb-3(b)(1), unless the authorization is terminated  or revoked sooner.       Influenza A by PCR NEGATIVE NEGATIVE Final   Influenza B by PCR NEGATIVE NEGATIVE Final    Comment: (NOTE) The Xpert Xpress SARS-CoV-2/FLU/RSV plus assay is intended as an aid in the  diagnosis of influenza from Nasopharyngeal swab specimens and should not be used as a sole basis for treatment. Nasal washings and aspirates are unacceptable for Xpert Xpress SARS-CoV-2/FLU/RSV testing.  Fact Sheet for Patients: BloggerCourse.com  Fact Sheet for Healthcare Providers: SeriousBroker.it  This test is not yet approved or cleared by the [place] FDA and has been authorized for detection and/or diagnosis of SARS-CoV-2 by FDA under an Emergency Use Authorization (EUA). This EUA will remain in effect (meaning this test can be used) for the duration of the COVID-19 declaration under Section 564(b)(1) of the Act, 21 U.S.C. section 360bbb-3(b)(1), unless the authorization is terminated or revoked.  Performed at [facility], [address] [place], [place] [address]   MRSA PCR Screening     Status: None   Collection Time: 03/29/20  5:38 AM   Specimen: Nasal Mucosa; Nasopharyngeal  Result Value Ref Range Status   MRSA by PCR NEGATIVE NEGATIVE Final    Comment:        The GeneXpert MRSA Assay (FDA approved for NASAL specimens only), is one component of a comprehensive MRSA colonization surveillance program. It is not intended to diagnose MRSA infection nor to guide or monitor treatment for MRSA infections. Performed at [facility], [address] [place], [place] [address]      RN Pressure Injury Documentation:   Estimated body mass index is 16.36 kg/m as calculated from the following:   Height as of this encounter: 5' (1.524 m).   Weight as of this encounter: 38 kg.  Malnutrition Type:  Nutrition Problem: Severe Malnutrition Etiology: chronic illness (dementia)  Malnutrition Characteristics:  Signs/Symptoms: moderate fat depletion,severe muscle depletion  Nutrition Interventions:  Interventions: Ensure Surgery,MVI   Radiology Studies: No results  found. Scheduled Meds: . amLODipine  10 mg Oral Daily  . carvedilol  12.5 mg Oral BID WC  . donepezil  5 mg Oral Daily  . feeding supplement  237 mL Oral BID BM  . lacosamide  200 mg Oral BID AC  . levETIRAcetam  1,000 mg Oral BID  . multivitamin with minerals  1 tablet Oral Daily  . pneumococcal 23 valent vaccine  0.5 mL Intramuscular Tomorrow-1000   Continuous Infusions:   LOS: 2 days   Merlene Laughtermair Latif Sheikh, DO [facility] PAGER is on AMION  If 7PM-7AM, please contact night-coverage [email or web address]

## 2020-03-29 NOTE — Anesthesia Postprocedure Evaluation (Signed)
Anesthesia Post Note  Patient: Roben Tatsch  Procedure(s) Performed: TOTAL HIP ARTHROPLASTY ANTERIOR APPROACH (Left Hip)     Patient location during evaluation: PACU Anesthesia Type: General Level of consciousness: awake and alert Pain management: pain level controlled Vital Signs Assessment: post-procedure vital signs reviewed and stable Respiratory status: spontaneous breathing, nonlabored ventilation, respiratory function stable and patient connected to nasal cannula oxygen Cardiovascular status: blood pressure returned to baseline and stable Postop Assessment: no apparent nausea or vomiting Anesthetic complications: no   No complications documented.  Last Vitals:  Vitals:   03/29/20 1545 03/29/20 1607  BP: 117/72 118/74  Pulse:  89  Resp: 14 16  Temp: (!) 36.4 C 36.7 C  SpO2: 98% 96%    Last Pain:  Vitals:   03/29/20 1607  TempSrc: Oral  PainSc: 0-No pain                 Earl Lites P Stoltzfus

## 2020-03-29 NOTE — Anesthesia Procedure Notes (Signed)
Procedure Name: Intubation Date/Time: 03/29/2020 1:24 PM Performed by: Montel Clock, CRNA Pre-anesthesia Checklist: Patient identified, Emergency Drugs available, Suction available, Patient being monitored and Timeout performed Patient Re-evaluated:Patient Re-evaluated prior to induction Oxygen Delivery Method: Circle system utilized Preoxygenation: Pre-oxygenation with 100% oxygen Induction Type: IV induction Ventilation: Mask ventilation without difficulty Laryngoscope Size: Mac and 3 Grade View: Grade I Tube type: Oral Tube size: 7.0 mm Number of attempts: 1 Airway Equipment and Method: Stylet Placement Confirmation: ETT inserted through vocal cords under direct vision,  positive ETCO2 and breath sounds checked- equal and bilateral Secured at: 21 cm Tube secured with: Tape Dental Injury: Teeth and Oropharynx as per pre-operative assessment

## 2020-03-30 ENCOUNTER — Encounter (HOSPITAL_COMMUNITY): Payer: Self-pay | Admitting: Orthopedic Surgery

## 2020-03-30 DIAGNOSIS — G309 Alzheimer's disease, unspecified: Secondary | ICD-10-CM | POA: Diagnosis not present

## 2020-03-30 DIAGNOSIS — N179 Acute kidney failure, unspecified: Secondary | ICD-10-CM

## 2020-03-30 DIAGNOSIS — S72002A Fracture of unspecified part of neck of left femur, initial encounter for closed fracture: Secondary | ICD-10-CM | POA: Diagnosis not present

## 2020-03-30 DIAGNOSIS — N1832 Chronic kidney disease, stage 3b: Secondary | ICD-10-CM | POA: Diagnosis not present

## 2020-03-30 DIAGNOSIS — N189 Chronic kidney disease, unspecified: Secondary | ICD-10-CM

## 2020-03-30 DIAGNOSIS — I5042 Chronic combined systolic (congestive) and diastolic (congestive) heart failure: Secondary | ICD-10-CM | POA: Diagnosis not present

## 2020-03-30 LAB — CBC
HCT: 28.2 % — ABNORMAL LOW (ref 36.0–46.0)
Hemoglobin: 8.8 g/dL — ABNORMAL LOW (ref 12.0–15.0)
MCH: 32 pg (ref 26.0–34.0)
MCHC: 31.2 g/dL (ref 30.0–36.0)
MCV: 102.5 fL — ABNORMAL HIGH (ref 80.0–100.0)
Platelets: 163 10*3/uL (ref 150–400)
RBC: 2.75 MIL/uL — ABNORMAL LOW (ref 3.87–5.11)
RDW: 17.7 % — ABNORMAL HIGH (ref 11.5–15.5)
WBC: 12.1 10*3/uL — ABNORMAL HIGH (ref 4.0–10.5)
nRBC: 0 % (ref 0.0–0.2)

## 2020-03-30 LAB — COMPREHENSIVE METABOLIC PANEL
ALT: 15 U/L (ref 0–44)
AST: 24 U/L (ref 15–41)
Albumin: 3 g/dL — ABNORMAL LOW (ref 3.5–5.0)
Alkaline Phosphatase: 45 U/L (ref 38–126)
Anion gap: 10 (ref 5–15)
BUN: 43 mg/dL — ABNORMAL HIGH (ref 8–23)
CO2: 21 mmol/L — ABNORMAL LOW (ref 22–32)
Calcium: 8.6 mg/dL — ABNORMAL LOW (ref 8.9–10.3)
Chloride: 105 mmol/L (ref 98–111)
Creatinine, Ser: 1.84 mg/dL — ABNORMAL HIGH (ref 0.44–1.00)
GFR, Estimated: 28 mL/min — ABNORMAL LOW (ref >60)
Glucose, Bld: 114 mg/dL — ABNORMAL HIGH (ref 70–99)
Potassium: 5.1 mmol/L (ref 3.5–5.1)
Sodium: 136 mmol/L (ref 135–145)
Total Bilirubin: 0.2 mg/dL — ABNORMAL LOW (ref 0.3–1.2)
Total Protein: 6.2 g/dL — ABNORMAL LOW (ref 6.5–8.1)

## 2020-03-30 LAB — MAGNESIUM: Magnesium: 2.5 mg/dL — ABNORMAL HIGH (ref 1.7–2.4)

## 2020-03-30 LAB — PHOSPHORUS: Phosphorus: 4.4 mg/dL (ref 2.5–4.6)

## 2020-03-30 MED ORDER — SODIUM CHLORIDE 0.9 % IV BOLUS
500.0000 mL | Freq: Once | INTRAVENOUS | Status: AC
  Administered 2020-03-30: 18:00:00 500 mL via INTRAVENOUS

## 2020-03-30 MED ORDER — ASPIRIN 81 MG PO CHEW: 81.0000 mg | CHEWABLE_TABLET | Freq: Two times a day (BID) | ORAL | 0 refills | Status: DC

## 2020-03-30 MED ORDER — SODIUM CHLORIDE 0.9 % IV SOLN: INTRAVENOUS | Status: DC

## 2020-03-30 MED ORDER — HYDROCODONE-ACETAMINOPHEN 5-325 MG PO TABS: 1.0000 | ORAL_TABLET | ORAL | 0 refills | Status: DC | PRN

## 2020-03-30 NOTE — Progress Notes (Signed)
PROGRESS NOTE    [name]  OFB:[id number] DOB: 20-Dec-1940 DOA: 03/26/2020 PCP: Taylor Else, MD   Brief Narrative:  The patient is a 80 year old extremely thin African-American female with a past medical history significant for but not limited to dementia, chronic combined systolic and diastolic CHF, nonobstructive CAD, CKD stage IIIb, history of a CVA, seizure disorder, hypertension as well as other comorbidities presented to the emergency room for evaluation after a fall.  She is found on the floor next to her bed at her home and because of her dementia she is unable to recall what happened.  She denies any pain but when her left leg was moved she grimaced in pain.  She has been living with her son recently was seen in the emergency department 2 days ago for transient speech disturbance but had a head CT that day with no acute findings and returned to baseline and was sent home.  In the ED is worked up and found to have an elevated blood pressure, EKG showed sinus tachycardia, and chest x-ray was notable for cardiomegaly with questionable pulmonary venous hypertension but no frank edema noted.  X-rays of the knee were negative and there is no acute cervical spine fracture or subluxation noted on CT scan.  CT of the head demonstrated mild cerebral atrophy and changes of microvascular disease without acute findings.  Radiographs of the hip and pelvis demonstrate acute proximal left femur fracture.  In the ED she was treated with IV morphine and Zofran and orthopedic surgery was consulted for further evaluation.  **Interim History Orthopedic Surgery planning Surgical Intervention Sunday 03/29/20.  She is postoperative day 1 and now renal function went up slightly and hemoglobin dropped.  We will hold her amlodipine for now and start gentle IV fluid hydration with normal saline for 12 hours.  We will need to continue to monitor her blood count carefully and transfuse for hemoglobin less than  7  Assessment & Plan:   Principal Problem:   Closed fracture of left hip (HCC) Active Problems:   Seizure disorder (HCC)   H/O: CVA (cerebrovascular accident)   Chronic combined systolic and diastolic congestive heart failure (HCC)   Chronic kidney disease, stage 3b (HCC)   Alzheimer's dementia without behavioral disturbance (HCC)   SIRS (systemic inflammatory response syndrome) (HCC)  Acute Left Hip/Femur Fracturestatus post left total hip Ortho plasty anterior approach -Presents with left hip pain and deformity after being found on the floor beside her bed and is found to have acute proximal left femur fracture -Orthopedic surgery consulting and much appreciatedand Dr. Linna Caprice planning on surgical intervention on Sunday, 03/29/2020 with n.p.o. at midnight Saturday into Sunday -Based on the available data, [name] presents an estimated 2.7% risk of perioperative MI or cardiac arrest -She was initially kept NPO but since she is going to have surgery on Sunday we will give her diet today, hold ASA and ARB, continue pain-control and supportive care -Orthopedic surgery recommending weightbearing as tolerated with a walker and DVT prophylaxis with aspirin, SCDs and TED hose -Ensure good bowel regimen and she is on docusate sodium 100 mg p.o. twice daily and if necessary will add MiraLAX 17 g daily and suppository -Continued to have Pain when she moves: Pain control with IV Morphine 0.5 to 1 mg every 2 hours as needed severe pain for pain level of 7-10, hydrocodone-acetaminophen 1 to 2 tablets every 4 hours as needed for moderate pain for pain score 4-6 and Acetaminophen for mild pain -  C/w Antiemetics with IV Ondansetron 4 mg q6hprn Nausea/Vomiting; also has metoclopramide 5 to 10 mg p.o./IV every 8 hours as needed for nausea that not controlled by Zofran -Discharge disposition pending based on PT and OT evaluation  SIRS, in the setting of fracture -Leukocytosis and mild  tachycardia noted on admission without fever or apparent infectious process -Likely reactive, continue pain-control, supportive care, and culture if febrile -WBC went from 16.0 -> 12.4 -> 9.3 -> 9.0 and today it is 12.1 and is elevated in the setting of her surgery and likely a postoperative elevation -Heart rates have been improving -Of note respiratory panel by PCR with influenza AMB was negative along with SARS coronavirus 2 being negative -Continue to monitor temperature curve and WBC and repeat in a.m.  Chronic combined systolic and diastolicic CHF -Appears hypovolemic on admission -EF was 30-35% in September 2021 -Hold ARB perioperatively, continue Carvedilol 12.5 mg po BID, judicious IVFin accordance not to volume overload; fluid had been stopped but will now resume given her AKI and start NS at 75 mL/hr and gently hydrate -Patient is -1,253.9 mL since admission -Strict I's and O's and daily weights -Continue to Monitor Volume Status and for S/Sx of Volume Overload   AKI on CKD IIIb Metabolic Acidosis, mild  -SCr is 1.52 on admission, close to apparent baseline and has improved and BUN/creatinine is now trending down further from 25/1.35 -> 25/1.13 -> 41/1.27 -> 43/1.84 -Has a mild Acidosis with a CO2 of 23, AG of 8, and Chloride Level of 105 -resume IV fluid given her slight worsening -Continue to avoid nephrotoxic medications, contrast dyes, hypotension and renally adjust and dose medications -Trend trend and repeat CMP in a.m.  HTN -Hold Amlodipine 10 mg po Daily but will continue Carvedilol 12.5 mg po BID -C/w Labetalol 10 mg IV q6hprn SBP >180 or DBP > 100 -Continue to Monitor BP per Protocol -Last BP was 111/61  Normocytic anemia/anemia of chronic kidney disease now superimposed on acute blood loss anemia in the setting of postoperative drop -Patient's hemoglobin/hematocrit went from 12.4/37.5 ->11.8/35.66 -> 11.1/33.4 -> 11.3/34.0 and is now  8.8/28.2 -Check anemia panel ordered an iron level of 61, U IBC 183, TIBC of 244, saturation ratios of 25%, ferritin level 661, folate level 22.4, vitamin B12 601 -Continue monitor for signs and symptoms of bleeding; currently no overt bleeding noted -As expected she had a postoperative drop and will transfuse for hemoglobin less than 7 -Repeat CBC in a.m.  Seizures -Continue Levetiracetam 1000 mg po BID and Lacosamide 200 mg po BID   Dementia -Continue Donepezil 5 mg po Daily  -Delirium precautions  Hypermagnesemia -Mild -Patient is magnesium level is now 2.7 yesterday and repeat is 2.5 -Continue to monitor and trend and repeat magnesium level in the a.m.  History of CVA -We will holding aspirin prior to surgery but now she is on aspirin milligrams p.o. daily with breakfast  Underweight/Severe Malnutrition in the Context of Chronic Illness -Nutritionist consulted for further evaluation and recommendations -C/w Ensure Surgery po BID and MVI + Minerals Daily   DVT prophylaxis: SCDs; TED hose, aspirin 325 mg p.o. daily with breakfast per orthopedic recommendations Code Status: FULL CODE Family Communication: No family present at bedside  Disposition Plan: Pending further surgical intervention and clearance by Orthopedic Surgery  Status is: Inpatient  Remains inpatient appropriate because:Unsafe d/c plan, IV treatments appropriate due to intensity of illness or inability to take PO and Inpatient level of care appropriate due to severity of illness  Dispo: The patient is from: Home              Anticipated d/c is to: SNF              Patient currently is not medically stable to d/c.   Difficult to place patient No  Consultants:   Orthopedic Surgery    Procedures:  PROCEDURE: Left total hip arthroplasty, anterior approach.   IMPLANTS: DePuy Tri Lock stem, size 5, hi offset. DePuy Pinnacle Cup, size 48 mm. DePuy Altrx liner, size 32 by 48 mm, neutral. DePuy  metal head ball, size 32 + 9 mm.  Antimicrobials:  Anti-infectives (From admission, onward)   Start     Dose/Rate Route Frequency Ordered Stop   03/30/20 0600  ceFAZolin (ANCEF) IVPB 2g/100 mL premix        2 g 200 mL/hr over 30 Minutes Intravenous On call to O.R. 03/29/20 1341 03/29/20 1335   03/29/20 2000  ceFAZolin (ANCEF) IVPB 2g/100 mL premix        2 g 200 mL/hr over 30 Minutes Intravenous Every 6 hours 03/29/20 1603 03/30/20 0221   03/29/20 1213  ceFAZolin (ANCEF) 2-4 GM/100ML-% IVPB       Note to Pharmacy: Lyda Kalata   : cabinet override      03/29/20 1213 03/29/20 1342        Subjective: Seen and examined at bedside was resting and feeling sleepy and wanting to go back to sleep.  No nausea or vomiting.  Thinks her pain is okay.  No chest pain or shortness of breath.  States that she had okay night.  No other concerns or complaints at this time.  Objective: Vitals:   03/29/20 1608 03/29/20 1701 03/29/20 2126 03/30/20 0553  BP:  119/75 104/63 111/61  Pulse:  92 82 78  Resp:  14 16 15   Temp: (!) 97 F (36.1 C) 98.3 F (36.8 C) 98.5 F (36.9 C) 98.4 F (36.9 C)  TempSrc:   Oral Oral  SpO2:  96% 98% 92%  Weight:      Height:        Intake/Output Summary (Last 24 hours) at 03/30/2020 04/01/2020 Last data filed at 03/30/2020 0554 Gross per 24 hour  Intake 820 ml  Output 600 ml  Net 220 ml   Filed Weights   03/26/20 2229  Weight: 38 kg   Examination: Physical Exam:  Constitutional: The patient is a thin elderly African-American female currently no acute distress appears calm and comfortable now Eyes: Lids and conjunctivae normal, sclerae anicteric  ENMT: External Ears, Nose appear normal. Grossly normal hearing. Neck: Appears normal, supple, no cervical masses, normal ROM, no appreciable thyromegaly; no JVD Respiratory: Diminished to auscultation bilaterally with coarse breath sounds, no wheezing, rales, rhonchi or crackles. Normal respiratory effort and patient  is not tachypenic. No accessory muscle use.  Unlabored breathing Cardiovascular: RRR, no murmurs / rubs / gallops. S1 and S2 auscultated. No extremity edema. Abdomen: Soft, non-tender, non-distended. Bowel sounds positive.  GU: Deferred. Musculoskeletal: No clubbing / cyanosis of digits/nails. No joint deformity upper and lower extremities.  Skin: No rashes, lesions, ulcers on limited skin evaluation. No induration; Warm and dry.  Neurologic: CN 2-12 grossly intact with no focal deficits. Romberg sign and cerebellar reflexes not assessed.  Psychiatric: Mildly impaired judgment and insight.  She is awake and alert. Normal mood and appropriate affect.   Data Reviewed: I have personally reviewed following labs and imaging studies  CBC: Recent Labs  Lab  03/25/20 1450 03/27/20 0033 03/27/20 0532 03/28/20 0359 03/29/20 0455 03/30/20 0438  WBC 7.2 16.0* 12.4* 9.3 9.0 12.1*  NEUTROABS 5.1  --   --  7.0 6.4  --   HGB 10.7* 12.4 11.8* 11.1* 11.3* 8.8*  HCT 32.5* 37.5 35.6* 33.4* 34.0* 28.2*  MCV 99.4 97.2 98.3 97.7 97.4 102.5*  PLT 309 311 280 239 211 163   Basic Metabolic Panel: Recent Labs  Lab 03/27/20 0033 03/27/20 0532 03/28/20 0359 03/29/20 0455 03/30/20 0438  NA 138 137 135 136 136  K 5.1 4.3 4.4 4.8 5.1  CL 108 106 106 105 105  CO2 19* 20* 20* 23 21*  GLUCOSE 147* 128* 114* 116* 114*  BUN 27* 25* 25* 41* 43*  CREATININE 1.52* 1.35* 1.13* 1.27* 1.84*  CALCIUM 9.8 9.5 9.4 9.7 8.6*  MG  --   --  1.8 2.7*  --   PHOS  --   --  3.1 2.5  --    GFR: Estimated Creatinine Clearance: 15.4 mL/min (A) (by C-G formula based on SCr of 1.84 mg/dL (H)). Liver Function Tests: Recent Labs  Lab 03/25/20 1233 03/28/20 0359 03/29/20 0455 03/30/20 0438  AST 27 16 16 24   ALT 19 17 15 15   ALKPHOS 78 64 58 45  BILITOT 0.8 0.8 0.6 0.2*  PROT 9.0* 7.4 7.5 6.2*  ALBUMIN 4.3 3.5 3.6 3.0*   No results for input(s): LIPASE, AMYLASE in the last 168 hours. No results for input(s): AMMONIA  in the last 168 hours. Coagulation Profile: Recent Labs  Lab 03/25/20 1233 03/25/20 1450 03/27/20 0033  INR QUESTIONABLE RESULT/NEW SPEC REQ 0.9 0.9   Cardiac Enzymes: No results for input(s): CKTOTAL, CKMB, CKMBINDEX, TROPONINI in the last 168 hours. BNP (last 3 results) No results for input(s): PROBNP in the last 8760 hours. HbA1C: No results for input(s): HGBA1C in the last 72 hours. CBG: Recent Labs  Lab 03/25/20 1234  GLUCAP 95   Lipid Profile: No results for input(s): CHOL, HDL, LDLCALC, TRIG, CHOLHDL, LDLDIRECT in the last 72 hours. Thyroid Function Tests: No results for input(s): TSH, T4TOTAL, FREET4, T3FREE, THYROIDAB in the last 72 hours. Anemia Panel: Recent Labs    03/28/20 0359 03/28/20 1031  VITAMINB12  --  601  FOLATE  --  22.4  FERRITIN  --  661*  TIBC  --  244*  IRON  --  61  RETICCTPCT 1.2  --    Sepsis Labs: No results for input(s): PROCALCITON, LATICACIDVEN in the last 168 hours.  Recent Results (from the past 240 hour(s))  Resp Panel by RT-PCR (Flu A&B, Covid) Nasopharyngeal Swab     Status: None   Collection Time: 03/25/20 12:33 PM   Specimen: Nasopharyngeal Swab; Nasopharyngeal(NP) swabs in vial transport medium  Result Value Ref Range Status   SARS Coronavirus 2 by RT PCR NEGATIVE NEGATIVE Final    Comment: (NOTE) SARS-CoV-2 target nucleic acids are NOT DETECTED.  The SARS-CoV-2 RNA is generally detectable in upper respiratory specimens during the acute phase of infection. The lowest concentration of SARS-CoV-2 viral copies this assay can detect is 138 copies/mL. A negative result does not preclude SARS-Cov-2 infection and should not be used as the sole basis for treatment or other patient management decisions. A negative result may occur with  improper specimen collection/handling, submission of specimen other than nasopharyngeal swab, presence of viral mutation(s) within the areas targeted by this assay, and inadequate number of  viral copies(<138 copies/mL). A negative result must be combined with  clinical observations, patient history, and epidemiological information. The expected result is Negative.  Fact Sheet for Patients:  BloggerCourse.com  Fact Sheet for Healthcare Providers:  SeriousBroker.it  This test is no t yet approved or cleared by the [place] FDA and  has been authorized for detection and/or diagnosis of SARS-CoV-2 by FDA under an Emergency Use Authorization (EUA). This EUA will remain  in effect (meaning this test can be used) for the duration of the COVID-19 declaration under Section 564(b)(1) of the Act, 21 U.S.C.section 360bbb-3(b)(1), unless the authorization is terminated  or revoked sooner.       Influenza A by PCR NEGATIVE NEGATIVE Final   Influenza B by PCR NEGATIVE NEGATIVE Final    Comment: (NOTE) The Xpert Xpress SARS-CoV-2/FLU/RSV plus assay is intended as an aid in the diagnosis of influenza from Nasopharyngeal swab specimens and should not be used as a sole basis for treatment. Nasal washings and aspirates are unacceptable for Xpert Xpress SARS-CoV-2/FLU/RSV testing.  Fact Sheet for Patients: BloggerCourse.com  Fact Sheet for Healthcare Providers: SeriousBroker.it  This test is not yet approved or cleared by the [place] FDA and has been authorized for detection and/or diagnosis of SARS-CoV-2 by FDA under an Emergency Use Authorization (EUA). This EUA will remain in effect (meaning this test can be used) for the duration of the COVID-19 declaration under Section 564(b)(1) of the Act, 21 U.S.C. section 360bbb-3(b)(1), unless the authorization is terminated or revoked.  Performed at [facility], [address], [place], [place] [address]   Resp Panel by RT-PCR (Flu A&B, Covid) Nasopharyngeal Swab     Status: None   Collection Time:  03/27/20  2:10 AM   Specimen: Nasopharyngeal Swab; Nasopharyngeal(NP) swabs in vial transport medium  Result Value Ref Range Status   SARS Coronavirus 2 by RT PCR NEGATIVE NEGATIVE Final    Comment: (NOTE) SARS-CoV-2 target nucleic acids are NOT DETECTED.  The SARS-CoV-2 RNA is generally detectable in upper respiratory specimens during the acute phase of infection. The lowest concentration of SARS-CoV-2 viral copies this assay can detect is 138 copies/mL. A negative result does not preclude SARS-Cov-2 infection and should not be used as the sole basis for treatment or other patient management decisions. A negative result may occur with  improper specimen collection/handling, submission of specimen other than nasopharyngeal swab, presence of viral mutation(s) within the areas targeted by this assay, and inadequate number of viral copies(<138 copies/mL). A negative result must be combined with clinical observations, patient history, and epidemiological information. The expected result is Negative.  Fact Sheet for Patients:  BloggerCourse.com  Fact Sheet for Healthcare Providers:  SeriousBroker.it  This test is no t yet approved or cleared by the [place] FDA and  has been authorized for detection and/or diagnosis of SARS-CoV-2 by FDA under an Emergency Use Authorization (EUA). This EUA will remain  in effect (meaning this test can be used) for the duration of the COVID-19 declaration under Section 564(b)(1) of the Act, 21 U.S.C.section 360bbb-3(b)(1), unless the authorization is terminated  or revoked sooner.       Influenza A by PCR NEGATIVE NEGATIVE Final   Influenza B by PCR NEGATIVE NEGATIVE Final    Comment: (NOTE) The Xpert Xpress SARS-CoV-2/FLU/RSV plus assay is intended as an aid in the diagnosis of influenza from Nasopharyngeal swab specimens and should not be used as a sole basis for treatment. Nasal washings  and aspirates are unacceptable for Xpert Xpress SARS-CoV-2/FLU/RSV testing.  Fact Sheet for Patients:  BloggerCourse.comhttps://www.fda.gov/media/152166/download  Fact Sheet for Healthcare Providers: SeriousBroker.ithttps://www.fda.gov/media/152162/download  This test is not yet approved or cleared by the [place] FDA and has been authorized for detection and/or diagnosis of SARS-CoV-2 by FDA under an Emergency Use Authorization (EUA). This EUA will remain in effect (meaning this test can be used) for the duration of the COVID-19 declaration under Section 564(b)(1) of the Act, 21 U.S.C. section 360bbb-3(b)(1), unless the authorization is terminated or revoked.  Performed at [facility], [address] [place], [place] [address]   MRSA PCR Screening     Status: None   Collection Time: 03/29/20  5:38 AM   Specimen: Nasal Mucosa; Nasopharyngeal  Result Value Ref Range Status   MRSA by PCR NEGATIVE NEGATIVE Final    Comment:        The GeneXpert MRSA Assay (FDA approved for NASAL specimens only), is one component of a comprehensive MRSA colonization surveillance program. It is not intended to diagnose MRSA infection nor to guide or monitor treatment for MRSA infections. Performed at [facility], [address] [place], [place] [address]      RN Pressure Injury Documentation:   Estimated body mass index is 16.36 kg/m as calculated from the following:   Height as of this encounter: 5' (1.524 m).   Weight as of this encounter: 38 kg.  Malnutrition Type:  Nutrition Problem: Severe Malnutrition Etiology: chronic illness (dementia)  Malnutrition Characteristics:  Signs/Symptoms: moderate fat depletion,severe muscle depletion  Nutrition Interventions:  Interventions: Ensure Surgery,MVI   Radiology Studies: Pelvis Portable  Result Date: 03/29/2020 CLINICAL DATA:  Post left hip arthroplasty. EXAM: PORTABLE PELVIS 1-2 VIEWS COMPARISON:  03/26/2020  FINDINGS: Interval placement of left hip arthroplasty intact and normally located. Skin staples over the lateral soft tissues of the left hip. Remainder the exam is unchanged. IMPRESSION: Expected changes post left hip arthroplasty. Electronically Signed   By: Elberta Fortisaniel  Boyle M.D.   On: 03/29/2020 16:07   DG C-Arm 1-60 Min-No Report  Result Date: 03/29/2020 Fluoroscopy was utilized by the requesting physician.  No radiographic interpretation.   DG HIP OPERATIVE UNILAT W OR W/O PELVIS LEFT  Result Date: 03/29/2020 CLINICAL DATA:  Left hip fracture post arthroplasty. EXAM: OPERATIVE left HIP (WITH PELVIS IF PERFORMED) 2 VIEWS TECHNIQUE: Fluoroscopic spot image(s) were submitted for interpretation post-operatively. COMPARISON:  03/26/2020 FINDINGS: Examination demonstrates interval placement of left hip arthroplasty intact and normally located. Partially visualized right femoral hardware intact and unchanged. IMPRESSION: Expected changes post left hip arthroplasty. Electronically Signed   By: Elberta Fortisaniel  Boyle M.D.   On: 03/29/2020 14:35   Scheduled Meds: . amLODipine  10 mg Oral Daily  . aspirin EC  325 mg Oral Q breakfast  . carvedilol  12.5 mg Oral BID WC  . docusate sodium  100 mg Oral BID  . donepezil  5 mg Oral Daily  . feeding supplement  237 mL Oral BID BM  . lacosamide  200 mg Oral BID AC  . levETIRAcetam  1,000 mg Oral BID  . multivitamin with minerals  1 tablet Oral Daily  . pneumococcal 23 valent vaccine  0.5 mL Intramuscular Tomorrow-1000   Continuous Infusions:   LOS: 3 days   Merlene Laughtermair Latif Bernon Arviso, DO [facility] PAGER is on AMION  If 7PM-7AM, please contact night-coverage [email or web address]

## 2020-03-30 NOTE — Progress Notes (Signed)
    Subjective:  Patient reports pain as mild to moderate.  Denies N/V/CP/SOB. No complaints this morning  Objective:   VITALS:   Vitals:   03/29/20 1608 03/29/20 1701 03/29/20 2126 03/30/20 0553  BP:  119/75 104/63 111/61  Pulse:  92 82 78  Resp:  14 16 15   Temp: (!) 97 F (36.1 C) 98.3 F (36.8 C) 98.5 F (36.9 C) 98.4 F (36.9 C)  TempSrc:   Oral Oral  SpO2:  96% 98% 92%  Weight:      Height:        NAD ABD soft Neurovascular intact Sensation intact distally Intact pulses distally Dorsiflexion/Plantar flexion intact Incision: dressing C/D/I   Lab Results  Component Value Date   WBC 12.1 (H) 03/30/2020   HGB 8.8 (L) 03/30/2020   HCT 28.2 (L) 03/30/2020   MCV 102.5 (H) 03/30/2020   PLT 163 03/30/2020   BMET    Component Value Date/Time   NA 136 03/30/2020 0438   NA 135 (A) 05/23/2015 0000   K 5.1 03/30/2020 0438   CL 105 03/30/2020 0438   CO2 21 (L) 03/30/2020 0438   GLUCOSE 114 (H) 03/30/2020 0438   BUN 43 (H) 03/30/2020 0438   BUN 40 (A) 05/23/2015 0000   CREATININE 1.84 (H) 03/30/2020 0438   CREATININE 1.42 (H) 06/04/2015 1515   CALCIUM 8.6 (L) 03/30/2020 0438   GFRNONAA 28 (L) 03/30/2020 0438   GFRAA 38 (L) 07/23/2018 1657     Assessment/Plan: 1 Day Post-Op   Principal Problem:   Closed fracture of left hip (HCC) Active Problems:   Seizure disorder (HCC)   H/O: CVA (cerebrovascular accident)   Chronic combined systolic and diastolic congestive heart failure (HCC)   Chronic kidney disease, stage 3b (HCC)   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  Monitor and treat per hospitalist recommendations ABLA: HgB 8.8 this AM.  Continue to monitor PT/OT Dispo: D/C pending    07/25/2018 03/30/2020, 8:17 AM [facility] is now [facility] [address], [place], [place] Phone: [phone] [email or web address] Facebook  [phone]

## 2020-03-30 NOTE — Care Management Important Message (Signed)
Medicare IM printed for Social work team at [facility] to give to the patient. °

## 2020-03-30 NOTE — Progress Notes (Signed)
Patient's significant other was concerned about a twitch and shaking in her right hand.  It comes and goes.  Dr. Marland Mcalpine is aware and is looking into possible causes.

## 2020-03-30 NOTE — Evaluation (Signed)
Physical Therapy Evaluation Patient Details Name: [name] MRN: [id number] DOB: Feb 09, 1940 Today's Date: 03/30/2020   History of Present Illness  80 yo female admitted with L femoral neck fx. s/p L DA THA per Dr. Linna Caprice. medical history significant for dementia, chronic combined systolic and diastolic CHF, nonobstructive CAD, CKD 3b, history of CVA, seizure disorder, and hypertension  Clinical Impression  Pt admitted with above diagnosis.  Unsure of pt's baseline mobility, no family present. Pt is alert,  nods yes and states that she is "ready" however pt requires total assist for bed mobility, unable to assist self   Pt currently with functional limitations due to the deficits listed below (see PT Problem List). Pt will benefit from skilled PT to increase their independence and safety with mobility to allow discharge to the venue listed below.       Follow Up Recommendations SNF    Equipment Recommendations  None recommended by PT    Recommendations for Other Services       Precautions / Restrictions Precautions Precautions: Fall Restrictions Weight Bearing Restrictions: No LLE Weight Bearing: Weight bearing as tolerated      Mobility  Bed Mobility Overal bed mobility: Needs Assistance Bed Mobility: Supine to Sit;Sit to Supine     Supine to sit: Total assist Sit to supine: Total assist   General bed mobility comments: pt requires full assist from PT to transiton from supine and return to same    Transfers                 General transfer comment: NT/not attmepted with +1 assist given total assist with med mobility and poor sitting balance  Ambulation/Gait             General Gait Details: NT  Stairs            Wheelchair Mobility    Modified Rankin (Stroke Patients Only)       Balance Overall balance assessment: Needs assistance Sitting-balance support: Feet supported;No upper extremity supported Sitting balance-Leahy  Scale: Poor Sitting balance - Comments: requires assist to maintain midline, does not attempt to correct or assist self. states "no" when asked if she can hold herself up Postural control: Posterior lean                                   Pertinent Vitals/Pain Pain Assessment: Faces Faces Pain Scale: Hurts little more Pain Location: does not specify Pain Descriptors / Indicators: Grimacing Pain Intervention(s): Limited activity within patient's tolerance;Monitored during session;Repositioned;Premedicated before session    Home Living Family/patient expects to be discharged to:: Skilled nursing facility                      Prior Function           Comments: Family sleeping in room; pt unable to relay information. pt from home, has been living with son per MD notes. per previous PT notes pt was max assist of 2 in Dec 2021 and d/c to SNF at that time     Hand Dominance        Extremity/Trunk Assessment   Upper Extremity Assessment Upper Extremity Assessment: Generalized weakness (keeps bil UEs in flexion, shoulder adduction, appears to have arthritic changes in hands. able togently move UEs through partial PROM, some grimacing with incr shoulder flexion. pt spontaneously reaches at times with RUE.)    Lower Extremity  Assessment Lower Extremity Assessment: Generalized weakness;RLE deficits/detail RLE Deficits / Details: bil LEs--->> positioned in end range hip IR. pt flexes knees spontaneously. unable to participate in strength testing. able to move bil LEs grossly to 90/90 hip and knee flexion. grimaces with imposed movement but not overly resistant       Communication   Communication: Expressive difficulties  Cognition Arousal/Alertness: Awake/alert Behavior During Therapy: WFL for tasks assessed/performed Overall Cognitive Status: History of cognitive impairments - at baseline                                        General  Comments      Exercises     Assessment/Plan    PT Assessment Patient needs continued PT services  PT Problem List Decreased strength;Decreased mobility;Decreased range of motion;Decreased activity tolerance;Decreased balance;Pain;Decreased knowledge of use of DME;Decreased cognition       PT Treatment Interventions Therapeutic activities;Functional mobility training;Balance training;Therapeutic exercise;Patient/family education    PT Goals (Current goals can be found in the Care Plan section)  Acute Rehab PT Goals Patient Stated Goal: unable to state PT Goal Formulation: Patient unable to participate in goal setting Time For Goal Achievement: 04/13/20 Potential to Achieve Goals: Poor    Frequency Min 2X/week   Barriers to discharge        Co-evaluation               AM-PAC PT "6 Clicks" Mobility  Outcome Measure Help needed turning from your back to your side while in a flat bed without using bedrails?: Total Help needed moving from lying on your back to sitting on the side of a flat bed without using bedrails?: Total Help needed moving to and from a bed to a chair (including a wheelchair)?: Total Help needed standing up from a chair using your arms (e.g., wheelchair or bedside chair)?: Total Help needed to walk in hospital room?: Total Help needed climbing 3-5 steps with a railing? : Total 6 Click Score: 6    End of Session   Activity Tolerance: Patient tolerated treatment well Patient left: in bed;with call bell/phone within reach;with bed alarm set Nurse Communication: Mobility status PT Visit Diagnosis: Other abnormalities of gait and mobility (R26.89)    Time: 2951-8841 PT Time Calculation (min) (ACUTE ONLY): 13 min   Charges:   PT Evaluation $PT Eval Moderate Complexity: 1 Mod          Tara, PT  Acute Rehab Dept ([facility]) [phone] Pager [phone]  03/30/2020   [facility] 03/30/2020, 11:09 AM

## 2020-03-30 NOTE — [place] FL2 (Signed)
[place] MEDICAID FL2 LEVEL OF CARE SCREENING TOOL     IDENTIFICATION  Patient Name: [name] Birthdate: 05-16-1940 Sex: female Admission Date (Current Location): 03/26/2020  [facility] and [place] Number:  Producer, television/film/video and Address:  [facility],  [address] [place], [place] [address]      Provider Number: [id number]  Attending Physician Name and Address:  Merlene Laughter, DO  Relative Name and Phone Number:  [facility] Daughter   [phone] , Areli, Frary Daughter   [phone]    Current Level of Care: Hospital Recommended Level of Care: Skilled Nursing Facility Prior Approval Number:    Date Approved/Denied:   PASRR Number: [id number]  Discharge Plan: SNF    Current Diagnoses: Patient Active Problem List   Diagnosis Date Noted  . Closed fracture of left hip (HCC) 03/27/2020  . SIRS (systemic inflammatory response syndrome) (HCC) 03/27/2020  . Intertrochanteric fracture of right femur (HCC) 01/04/2020  . Humeral head fracture 01/04/2020  . Humeral surgical neck fracture 01/04/2020  . Witnessed seizure-like activity (HCC) 12/27/2019  . Chronic kidney disease, stage 3b (HCC) 12/27/2019  . Alzheimer's dementia without behavioral disturbance (HCC) 12/27/2019  . Gout 12/27/2019  . Leukocytosis 12/27/2019  . Cough   . Encounter for palliative care   . Goals of care, counseling/discussion   . Pneumonia   . NICM (nonischemic cardiomyopathy) -EF 25% 05/20/2015  . No significant CAD at cath May 2012 05/20/2015  . Anemia 05/20/2015  . Diastolic dysfunction-grade 3 05/20/2015  . Acute renal failure superimposed on stage 3 chronic kidney disease (HCC)   . CAP (community acquired pneumonia)   . Hyperammonemia (HCC) 05/17/2015  . Metabolic encephalopathy 05/16/2015  . Acute on chronic renal insufficiency 05/15/2015  . Chest pain 05/14/2015  . Troponin level elevated 05/14/2015  . Chronic combined systolic and diastolic congestive  heart failure (HCC) 03/06/2014  . Acute combined systolic and diastolic congestive heart failure (HCC) 02/24/2013  . Weight gain 02/24/2013  . Volume overload 02/24/2013  . Hyperglycemia 02/24/2013  . Dyspnea 02/24/2013  . Constipation 02/24/2013  . Abdominal pain 02/24/2013  . Pleuritic chest pain 02/24/2013  . Epilepsy (HCC)   . Smoker   . 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 Protein-calorie malnutrition, severe- wgt 86 lbs 02/13/2013  . Pneumobilia 02/12/2013  . Influenza A 02/11/2013  . Hyperkalemia 02/11/2013  . Hyponatremia 02/11/2013  . HCAP (healthcare-associated pneumonia) 02/11/2013  . Liver enzyme elevation 02/11/2013  . Basal ganglia infarction (HCC) 01/08/2013  . H/O: CVA (cerebrovascular accident) 01/04/2013  . Generalized seizure (HCC) 12/28/2012  . 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 Seizure disorder (HCC) 12/28/2012  . Metabolic acidosis 12/28/2012  . Encephalopathy 12/28/2012  . Acute renal failure (HCC) 12/28/2012    Orientation RESPIRATION BLADDER Height & Weight     Self  Normal Continent Weight: 83 lb 12.4 oz (38 kg) Height:  5' (152.4 cm)  BEHAVIORAL SYMPTOMS/MOOD NEUROLOGICAL BOWEL NUTRITION STATUS      Continent Diet  AMBULATORY STATUS COMMUNICATION OF NEEDS Skin   Extensive Assist Verbally Surgical wounds                       Personal Care Assistance Level of Assistance  Bathing,Feeding,Dressing Bathing Assistance: Maximum assistance Feeding assistance: Limited assistance Dressing Assistance: Maximum assistance     Functional Limitations Info  Sight,Hearing,Speech Sight Info: Adequate Hearing Info: Adequate Speech Info: Adequate    SPECIAL CARE FACTORS FREQUENCY  PT (By licensed  PT),OT (By licensed OT)     PT Frequency: 5X/week OT Frequency: 5X/week            Contractures Contractures Info: Not present    Additional Factors Info  Code Status,Allergies,Psychotropic Code Status  Info: Fullcode Allergies Info: Allergies: Budesonide-formoterol Fumarate, Lisinopril, Mirtazapine, Sodium Bicarbonate           Current Medications (03/30/2020):  This is the current hospital active medication list Current Facility-Administered Medications  Medication Dose Route Frequency Provider Last Rate Last Admin  . 0.9 %  sodium chloride infusion   Intravenous Continuous Marguerita Merles [place], [place] 75 mL/hr at 03/30/20 1033 Restarted at 03/30/20 1033  . acetaminophen (TYLENOL) tablet 325-650 mg  325-650 mg Oral Q6H PRN Swinteck, Arlys John, MD      . aspirin EC tablet 325 mg  325 mg Oral Q breakfast Samson Frederic, MD   325 mg at 03/30/20 0837  . carvedilol (COREG) tablet 12.5 mg  12.5 mg Oral BID WC Samson Frederic, MD   12.5 mg at 03/30/20 0837  . docusate sodium (COLACE) capsule 100 mg  100 mg Oral BID Samson Frederic, MD   100 mg at 03/30/20 1021  . donepezil (ARICEPT) tablet 5 mg  5 mg Oral Daily Swinteck, Arlys John, MD   5 mg at 03/30/20 1021  . feeding supplement (ENSURE SURGERY) liquid 237 mL  237 mL Oral BID BM Samson Frederic, MD   237 mL at 03/30/20 1027  . HYDROcodone-acetaminophen (NORCO/VICODIN) 5-325 MG per tablet 1-2 tablet  1-2 tablet Oral Q4H PRN Samson Frederic, MD   1 tablet at 03/30/20 1021  . labetalol (NORMODYNE) injection 10 mg  10 mg Intravenous Q6H PRN Swinteck, Arlys John, MD      . lacosamide (VIMPAT) tablet 200 mg  200 mg Oral BID AC Swinteck, Arlys John, MD   200 mg at 03/30/20 4765  . levETIRAcetam (KEPPRA) tablet 1,000 mg  1,000 mg Oral BID Samson Frederic, MD   1,000 mg at 03/30/20 1022  . menthol-cetylpyridinium (CEPACOL) lozenge 3 mg  1 lozenge Oral PRN Swinteck, Arlys John, MD       Or  . phenol (CHLORASEPTIC) mouth spray 1 spray  1 spray Mouth/Throat PRN Swinteck, Arlys John, MD      . metoCLOPramide (REGLAN) tablet 5-10 mg  5-10 mg Oral Q8H PRN Swinteck, Arlys John, MD       Or  . metoCLOPramide (REGLAN) injection 5-10 mg  5-10 mg Intravenous Q8H PRN Swinteck, Arlys John, MD      .  morphine 2 MG/ML injection 0.5-1 mg  0.5-1 mg Intravenous Q2H PRN Samson Frederic, MD   1 mg at 03/30/20 1250  . multivitamin with minerals tablet 1 tablet  1 tablet Oral Daily Samson Frederic, MD   1 tablet at 03/30/20 1021  . ondansetron (ZOFRAN) tablet 4 mg  4 mg Oral Q6H PRN Swinteck, Arlys John, MD       Or  . ondansetron (ZOFRAN) injection 4 mg  4 mg Intravenous Q6H PRN Swinteck, Arlys John, MD      . pneumococcal 23 valent vaccine (PNEUMOVAX-23) injection 0.5 mL  0.5 mL Intramuscular Tomorrow-1000 Opyd, Lavone Neri, MD         Discharge Medications: Please see discharge summary for a list of discharge medications.  Relevant Imaging Results:  Relevant Lab Results:   Additional Information SSN: [id number]. Vaccinated.  Clearance Coots, LCSW

## 2020-03-30 NOTE — TOC Initial Note (Addendum)
Transition of Care [facility]) - Initial/Assessment Note    Patient Details  Name: [name] MRN: [id number] Date of Birth: April 28, 1940  [facility] CM/SW Contact:    Clearance Coots, LCSW Phone Number: 03/30/2020, 1:53 PM  Clinical Narrative:  Patient after a fall and left femoral neck fracture and surgical intervention total hip arthroplasty anterior approach.             Patient significant other Leonette Most and friend at bedside. Patient significant other reports patient's daughter [name] and son Greggory Stallion make decisions about the patient health. He does not know if she has a Midwife. CSW reached out to the daughter [name], left a confidential voicemail. CSW then reached out to the patient son Greggory Stallion. He is agreeable to short term rehab at [facility]. He reports the patient went to [facility] a few months ago after falling at another SNF. He reports the patient does live alone however family is there daily to bring food and check on her. He reports that he has noticed a change in the patient mental status over the past two weeks, "it's like she fades away and talks gibberish." He reports she has a history of dementia but " it's gotten bad." He reports the patient was ambulatory with only a cane and needs assistance with bathing. He reports he is available to help with her needs and other relatives help as well.  CSW made a referral to SNF's in area.  FL2 completed.  CSW will provide bed offers when available.  Patient [employer] Plan is not managed by [employer] therefore the SNF will have to initiate the authorization.   Expected Discharge Plan: Skilled Nursing Facility Barriers to Discharge: Continued Medical Work up   Patient Goals and CMS Choice Patient states their goals for this hospitalization and ongoing recovery are:: Per son, go to rehab CMS Medicare.gov Compare Post Acute Care list provided to:: Patient Choice offered to / list presented to :  Patient  Expected Discharge Plan and Services Expected Discharge Plan: Skilled Nursing Facility In-house Referral: Clinical Social Work Discharge Planning Services: CM Consult Post Acute Care Choice: Durable Medical Equipment Living arrangements for the past 2 months: Single Family Home                                      Prior Living Arrangements/Services Living arrangements for the past 2 months: Single Family Home Lives with:: Self Patient language and need for interpreter reviewed:: Yes Do you feel safe going back to the place where you live?: Yes      Need for Family Participation in Patient Care: Yes (Comment) Care giver support system in place?: Yes (comment) Current home services: DME Criminal Activity/Legal Involvement Pertinent to Current Situation/Hospitalization: No - Comment as needed  Activities of Daily Living Home Assistive Devices/Equipment: Wheelchair ADL Screening (condition at time of admission) Patient's cognitive ability adequate to safely complete daily activities?: No Is the patient deaf or have difficulty hearing?: No Does the patient have difficulty seeing, even when wearing glasses/contacts?: No Does the patient have difficulty concentrating, remembering, or making decisions?: Yes Patient able to express need for assistance with ADLs?: Yes Does the patient have difficulty dressing or bathing?: Yes Independently performs ADLs?: No Communication: Needs assistance Is this a change from baseline?: Pre-admission baseline Dressing (OT): Needs assistance Is this a change from baseline?: Pre-admission baseline Grooming: Needs assistance Is  this a change from baseline?: Pre-admission baseline Feeding: Needs assistance Is this a change from baseline?: Pre-admission baseline Bathing: Needs assistance Is this a change from baseline?: Pre-admission baseline Toileting: Needs assistance Is this a change from baseline?: Pre-admission baseline In/Out  Bed: Dependent Is this a change from baseline?: Pre-admission baseline Walks in Home: Dependent Is this a change from baseline?: Pre-admission baseline Does the patient have difficulty walking or climbing stairs?: Yes Weakness of Legs: Both Weakness of Arms/Hands: Both  Permission Sought/Granted Permission sought to share information with : Family Electrical engineer    Share Information with NAME: Lillette Boxer   [phone], Sharnetta, [place] Daughter   [phone]  Permission granted to share info w AGENCY: SNF's in the area        Emotional Assessment Appearance:: Appears stated age Attitude/Demeanor/Rapport: Gracious Affect (typically observed): Calm Orientation: : Oriented to Self Alcohol / Substance Use: Not Applicable Psych Involvement: No (comment)  Admission diagnosis:  Closed fracture of left hip, initial encounter (HCC) [S72.002A] Closed left hip fracture, initial encounter (HCC) [S72.002A] Patient Active Problem List   Diagnosis Date Noted  . Closed fracture of left hip (HCC) 03/27/2020  . SIRS (systemic inflammatory response syndrome) (HCC) 03/27/2020  . Intertrochanteric fracture of right femur (HCC) 01/04/2020  . Humeral head fracture 01/04/2020  . Humeral surgical neck fracture 01/04/2020  . Witnessed seizure-like activity (HCC) 12/27/2019  . Chronic kidney disease, stage 3b (HCC) 12/27/2019  . Alzheimer's dementia without behavioral disturbance (HCC) 12/27/2019  . Gout 12/27/2019  . Leukocytosis 12/27/2019  . Cough   . Encounter for palliative care   . Goals of care, counseling/discussion   . Pneumonia   . NICM (nonischemic cardiomyopathy) -EF 25% 05/20/2015  . No significant CAD at cath May 2012 05/20/2015  . Anemia 05/20/2015  . Diastolic dysfunction-grade 3 05/20/2015  . Acute renal failure superimposed on stage 3 chronic kidney disease (HCC)   . CAP (community acquired pneumonia)   . Hyperammonemia (HCC) 05/17/2015  .  Metabolic encephalopathy 05/16/2015  . Acute on chronic renal insufficiency 05/15/2015  . Chest pain 05/14/2015  . Troponin level elevated 05/14/2015  . Chronic combined systolic and diastolic congestive heart failure (HCC) 03/06/2014  . Acute combined systolic and diastolic congestive heart failure (HCC) 02/24/2013  . Weight gain 02/24/2013  . Volume overload 02/24/2013  . Hyperglycemia 02/24/2013  . Dyspnea 02/24/2013  . Constipation 02/24/2013  . Abdominal pain 02/24/2013  . Pleuritic chest pain 02/24/2013  . Epilepsy (HCC)   . Smoker   . 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 Protein-calorie malnutrition, severe- wgt 86 lbs 02/13/2013  . Pneumobilia 02/12/2013  . Influenza A 02/11/2013  . Hyperkalemia 02/11/2013  . Hyponatremia 02/11/2013  . HCAP (healthcare-associated pneumonia) 02/11/2013  . Liver enzyme elevation 02/11/2013  . Basal ganglia infarction (HCC) 01/08/2013  . H/O: CVA (cerebrovascular accident) 01/04/2013  . Generalized seizure (HCC) 12/28/2012  . 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 Seizure disorder (HCC) 12/28/2012  . Metabolic acidosis 12/28/2012  . Encephalopathy 12/28/2012  . Acute renal failure (HCC) 12/28/2012   PCP:  [name], MD Pharmacy:   [id number] - [place], [place] - [address] AT [facility] OF [address] [place] [facility] [id number] Phone: [phone] Fax: [phone]     Social Determinants of Health (SDOH) Interventions    Readmission Risk Interventions No flowsheet data found.

## 2020-03-31 ENCOUNTER — Inpatient Hospital Stay (HOSPITAL_COMMUNITY)
Admit: 2020-03-31 | Discharge: 2020-03-31 | Disposition: A | Discharge status: Home / Self Care | Payer: Medicare HMO | Attending: Internal Medicine | Admitting: Internal Medicine

## 2020-03-31 ENCOUNTER — Other Ambulatory Visit: Payer: Self-pay

## 2020-03-31 DIAGNOSIS — I5042 Chronic combined systolic (congestive) and diastolic (congestive) heart failure: Secondary | ICD-10-CM | POA: Diagnosis not present

## 2020-03-31 DIAGNOSIS — R569 Unspecified convulsions: Secondary | ICD-10-CM

## 2020-03-31 DIAGNOSIS — N1832 Chronic kidney disease, stage 3b: Secondary | ICD-10-CM | POA: Diagnosis not present

## 2020-03-31 DIAGNOSIS — G309 Alzheimer's disease, unspecified: Secondary | ICD-10-CM | POA: Diagnosis not present

## 2020-03-31 DIAGNOSIS — S72002A Fracture of unspecified part of neck of left femur, initial encounter for closed fracture: Secondary | ICD-10-CM | POA: Diagnosis not present

## 2020-03-31 LAB — MAGNESIUM: Magnesium: 2.3 mg/dL (ref 1.7–2.4)

## 2020-03-31 LAB — CBC WITH DIFFERENTIAL/PLATELET
Abs Immature Granulocytes: 0.04 10*3/uL (ref 0.00–0.07)
Basophils Absolute: 0 10*3/uL (ref 0.0–0.1)
Basophils Relative: 0 %
Eosinophils Absolute: 0.1 10*3/uL (ref 0.0–0.5)
Eosinophils Relative: 1 %
HCT: 24.8 % — ABNORMAL LOW (ref 36.0–46.0)
Hemoglobin: 7.8 g/dL — ABNORMAL LOW (ref 12.0–15.0)
Immature Granulocytes: 1 %
Lymphocytes Relative: 12 %
Lymphs Abs: 1 10*3/uL (ref 0.7–4.0)
MCH: 32.1 pg (ref 26.0–34.0)
MCHC: 31.5 g/dL (ref 30.0–36.0)
MCV: 102.1 fL — ABNORMAL HIGH (ref 80.0–100.0)
Monocytes Absolute: 1.2 10*3/uL — ABNORMAL HIGH (ref 0.1–1.0)
Monocytes Relative: 14 %
Neutro Abs: 6.4 10*3/uL (ref 1.7–7.7)
Neutrophils Relative %: 72 %
Platelets: 173 10*3/uL (ref 150–400)
RBC: 2.43 MIL/uL — ABNORMAL LOW (ref 3.87–5.11)
RDW: 17.5 % — ABNORMAL HIGH (ref 11.5–15.5)
WBC: 8.8 10*3/uL (ref 4.0–10.5)
nRBC: 0 % (ref 0.0–0.2)

## 2020-03-31 LAB — COMPREHENSIVE METABOLIC PANEL
ALT: <5 U/L (ref 0–44)
AST: 25 U/L (ref 15–41)
Albumin: 2.7 g/dL — ABNORMAL LOW (ref 3.5–5.0)
Alkaline Phosphatase: 50 U/L (ref 38–126)
Anion gap: 7 (ref 5–15)
BUN: 38 mg/dL — ABNORMAL HIGH (ref 8–23)
CO2: 20 mmol/L — ABNORMAL LOW (ref 22–32)
Calcium: 8.3 mg/dL — ABNORMAL LOW (ref 8.9–10.3)
Chloride: 111 mmol/L (ref 98–111)
Creatinine, Ser: 1.46 mg/dL — ABNORMAL HIGH (ref 0.44–1.00)
GFR, Estimated: 37 mL/min — ABNORMAL LOW (ref >60)
Glucose, Bld: 97 mg/dL (ref 70–99)
Potassium: 4.5 mmol/L (ref 3.5–5.1)
Sodium: 138 mmol/L (ref 135–145)
Total Bilirubin: 0.4 mg/dL (ref 0.3–1.2)
Total Protein: 5.9 g/dL — ABNORMAL LOW (ref 6.5–8.1)

## 2020-03-31 LAB — PHOSPHORUS: Phosphorus: 2.5 mg/dL (ref 2.5–4.6)

## 2020-03-31 MED ORDER — SENNOSIDES-DOCUSATE SODIUM 8.6-50 MG PO TABS
1.0000 | ORAL_TABLET | Freq: Two times a day (BID) | ORAL | Status: DC
  Administered 2020-03-31 – 2020-04-01 (×2): 1 via ORAL
  Filled 2020-03-31 (×2): qty 1

## 2020-03-31 MED ORDER — BISACODYL 10 MG RE SUPP: 10.0000 mg | Freq: Every day | RECTAL | Status: DC | PRN

## 2020-03-31 MED ORDER — POLYETHYLENE GLYCOL 3350 17 G PO PACK
17.0000 g | PACK | Freq: Two times a day (BID) | ORAL | Status: DC
  Administered 2020-03-31 – 2020-04-01 (×2): 17 g via ORAL
  Filled 2020-03-31 (×2): qty 1

## 2020-03-31 MED ORDER — SODIUM CHLORIDE 0.9 % IV SOLN: INTRAVENOUS | Status: AC

## 2020-03-31 NOTE — Progress Notes (Signed)
PROGRESS NOTE    [name]  CZY:[id number] DOB: 24-Jan-1941 DOA: 03/26/2020 PCP: [name], MD   Brief Narrative:  The patient is a 80 year old extremely thin African-American female with a past medical history significant for but not limited to dementia, chronic combined systolic and diastolic CHF, nonobstructive CAD, CKD stage IIIb, history of a CVA, seizure disorder, hypertension as well as other comorbidities presented to the emergency room for evaluation after a fall.  She is found on the floor next to her bed at her home and because of her dementia she is unable to recall what happened.  She denies any pain but when her left leg was moved she grimaced in pain.  She has been living with her son recently was seen in the emergency department 2 days ago for transient speech disturbance but had a head CT that day with no acute findings and returned to baseline and was sent home.  In the ED is worked up and found to have an elevated blood pressure, EKG showed sinus tachycardia, and chest x-ray was notable for cardiomegaly with questionable pulmonary venous hypertension but no frank edema noted.  X-rays of the knee were negative and there is no acute cervical spine fracture or subluxation noted on CT scan.  CT of the head demonstrated mild cerebral atrophy and changes of microvascular disease without acute findings.  Radiographs of the hip and pelvis demonstrate acute proximal left femur fracture.  In the ED she was treated with IV morphine and Zofran and orthopedic surgery was consulted for further evaluation.  **Interim History Orthopedic Surgery planning Surgical Intervention Sunday 03/29/20.  She is postoperative day 1 and now renal function went up slightly and hemoglobin dropped.  We will hold her amlodipine for now and start gentle IV fluid hydration and will continue it for another 12 hours today.  We will need to continue to monitor her blood count carefully and transfuse for  hemoglobin less than 7 . Patient was having intermittent twitching yesterday and because of her Seizure disorder will order an EEG.   Assessment & Plan:   Principal Problem:   Closed fracture of left hip (HCC) Active Problems:   Seizure disorder (HCC)   H/O: CVA (cerebrovascular accident)   Chronic combined systolic and diastolic congestive heart failure (HCC)   Chronic kidney disease, stage 3b (HCC)   Alzheimer's dementia without behavioral disturbance (HCC)   SIRS (systemic inflammatory response syndrome) (HCC)  Acute Left Hip/Femur Fracturestatus post left total hip Ortho plasty anterior approach -Presents with left hip pain and deformity after being found on the floor beside her bed and is found to have acute proximal left femur fracture -Orthopedic surgery consulting and much appreciatedand Dr. Linna Caprice planning on surgical intervention on Sunday, 03/29/2020 with n.p.o. at midnight Saturday into Sunday -Based on the available data, Mrs. Hohn presents an estimated 2.7% risk of perioperative MI or cardiac arrest -She was initially kept NPO but since she is going to have surgery on Sunday we will give her diet today, hold ASA and ARB, continue pain-control and supportive care -Orthopedic surgery recommending weightbearing as tolerated with a walker and DVT prophylaxis with aspirin, SCDs and TED hose -Ensure good bowel regimen and she is on docusate sodium 100 mg p.o. twice daily and if necessary will add MiraLAX 17 g daily and suppository -C/w Supportive Care  -Continued to have Pain when she moves: Pain control with IV Morphine 0.5 to 1 mg every 2 hours as needed severe pain for pain  level of 7-10, hydrocodone-acetaminophen 1 to 2 tablets every 4 hours as needed for moderate pain for pain score 4-6 and Acetaminophen for mild pain -C/w Antiemetics with IV Ondansetron 4 mg q6hprn Nausea/Vomiting; also has metoclopramide 5 to 10 mg p.o./IV every 8 hours as needed for nausea that not  controlled by Zofran -Discharge disposition pending based on PT and OT evaluation and they are recommending SNF  SIRS, in the setting of fracture -Leukocytosis and mild tachycardia noted on admission without fever or apparent infectious process -Likely reactive, continue pain-control, supportive care, and culture if febrile -WBC went from 16.0 -> 12.4 -> 9.3 -> 9.0 and today it is 12.1 and was elevated in the setting of her surgery and likely a postoperative elevation and is now improved to 8.8 -Heart rates have been improving -Of note respiratory panel by PCR with influenza AMB was negative along with SARS coronavirus 2 being negative -Continue to monitor temperature curve and WBC and repeat in a.m.  Chronic combined systolic and diastolicic CHF -Appears hypovolemic on admission -EF was 30-35% in September 2021 -Hold ARB perioperatively, continue Carvedilol 12.5 mg po BID, judicious IVFin accordance not to volume overload; fluid had been stopped but will now resume given her AKI and start NS at 75 mL/hr and gently hydrate -Patient is -686.2 mL since admission -Strict I's and O's and daily weights -Continue to Monitor Volume Status and for S/Sx of Volume Overload   AKI on CKD IIIb Metabolic Acidosis, mild  -SCr is 1.52 on admission, close to apparent baseline and has improved and BUN/creatinine is now trending down further from 25/1.35 -> 25/1.13 -> 41/1.27 -> 43/1.84 -> 38/1.46 -Has a mild Acidosis with a CO2 of 20, AG of 7, and Chloride Level of 111 -resume IV fluid given her slight worsening and will continue today at 75 mL's per hour for another 12 more hours and then discontinue -Continue to avoid nephrotoxic medications, contrast dyes, hypotension and renally adjust and dose medications -Trend trend and repeat CMP in a.m.  HTN -Hold Amlodipine 10 mg po Daily but will continue Carvedilol 12.5 mg po BID -C/w Labetalol 10 mg IV q6hprn SBP >180 or DBP > 100 -Continue  to Monitor BP per Protocol -Last BP was 124/84  Normocytic anemia/anemia of chronic kidney disease now superimposed on acute blood loss anemia in the setting of postoperative drop -Patient's hemoglobin/hematocrit went from 12.4/37.5 ->11.8/35.66 -> 11.1/33.4 -> 11.3/34.0 and is now trending down and gone from 8.8/28.2 -> 7.8/24.8; continue to monitor for signs and symptoms of bleeding and repeat CBC in a.m. if hemoglobin is stable  -Check anemia panel ordered an iron level of 61, U IBC 183, TIBC of 244, saturation ratios of 25%, ferritin level 661, folate level 22.4, vitamin B12 601 -Continue monitor for signs and symptoms of bleeding; currently no overt bleeding noted -As expected she had a postoperative drop and will transfuse for hemoglobin less than 7 -Repeat CBC in a.m.  Seizures -Continue Levetiracetam 1000 mg po BID and Lacosamide 200 mg po BID -Check EEG given that she has been intermittently twitching and shaking; there is no twitching or shaking on my evaluation this morning  Dementia -Continue Donepezil 5 mg po Daily  -Delirium precautions  Hypermagnesemia -Mild -Patient is magnesium level  Has gone from 2.7 -> 2.3 -Continue to monitor and trend and repeat magnesium level in the a.m.  History of CVA -We will holding aspirin prior to surgery but now she is on aspirin milligrams p.o. daily with breakfast  Underweight/Severe Malnutrition in the Context of Chronic Illness -Nutritionist consulted for further evaluation and recommendations -C/w Ensure Surgery po BID and MVI + Minerals Daily   DVT prophylaxis: SCDs; TED hose, aspirin 325 mg p.o. daily with breakfast per orthopedic recommendations Code Status: FULL CODE Family Communication: No family present at bedside  Disposition Plan: Pending further surgical intervention and clearance by Orthopedic Surgery; PT/OT recommending SNF   Status is: Inpatient  Remains inpatient appropriate because:Unsafe d/c plan, IV  treatments appropriate due to intensity of illness or inability to take PO and Inpatient level of care appropriate due to severity of illness   Dispo: The patient is from: Home              Anticipated d/c is to: SNF              Patient currently is not medically stable to d/c.   Difficult to place patient No  Consultants:   Orthopedic Surgery    Procedures:  PROCEDURE: Left total hip arthroplasty, anterior approach.   IMPLANTS: DePuy Tri Lock stem, size 5, hi offset. DePuy Pinnacle Cup, size 48 mm. DePuy Altrx liner, size 32 by 48 mm, neutral. DePuy metal head ball, size 32 + 9 mm.  Antimicrobials:  Anti-infectives (From admission, onward)   Start     Dose/Rate Route Frequency Ordered Stop   03/30/20 0600  ceFAZolin (ANCEF) IVPB 2g/100 mL premix        2 g 200 mL/hr over 30 Minutes Intravenous On call to O.R. 03/29/20 1341 03/29/20 1335   03/29/20 2000  ceFAZolin (ANCEF) IVPB 2g/100 mL premix        2 g 200 mL/hr over 30 Minutes Intravenous Every 6 hours 03/29/20 1603 03/30/20 0221   03/29/20 1213  ceFAZolin (ANCEF) 2-4 GM/100ML-% IVPB       Note to Pharmacy: Lyda Kalata   : cabinet override      03/29/20 1213 03/29/20 1342        Subjective: Seen and examined at bedside and thinks she is doing okay.  Denies any nausea or vomiting.  Denies lightheadedness or dizziness.  States the pain is tolerable.  No other concerns or complaints at this time.  Objective: Vitals:   03/30/20 0553 03/30/20 1416 03/30/20 2038 03/31/20 0543  BP: 111/61 105/60 110/64 124/84  Pulse: 78 82 99 83  Resp: 15 17 16 16   Temp: 98.4 F (36.9 C) 98.6 F (37 C) 99.8 F (37.7 C) 98.8 F (37.1 C)  TempSrc: Oral Oral Oral Oral  SpO2: 92% 95% 93% 96%  Weight:      Height:        Intake/Output Summary (Last 24 hours) at 03/31/2020 0802 Last data filed at 03/31/2020 0600 Gross per 24 hour  Intake 2372.77 ml  Output 1925 ml  Net 447.77 ml   Filed Weights   03/26/20 2229  Weight: 38 kg    Examination: Physical Exam:  Constitutional: The patient is a thin elderly African-American female currently no acute distress appears calm and relatively comfortable Eyes: Lids and conjunctivae normal, sclerae anicteric  ENMT: External Ears, Nose appear normal. Grossly normal hearing.  Neck: Appears normal, supple, no cervical masses, normal ROM, no appreciable thyromegaly,: No JVD Respiratory: Diminished to auscultation bilaterally, no wheezing, rales, rhonchi or crackles. Normal respiratory effort and patient is not tachypenic. No accessory muscle use.  Unlabored breathing Cardiovascular: RRR, no murmurs / rubs / gallops. S1 and S2 auscultated.  No appreciable extremity edema Abdomen: Soft, non-tender, non-distended.  Bowel sounds positive.  GU: Deferred. Musculoskeletal: No clubbing / cyanosis of digits/nails. No joint deformity upper and lower extremities.  Skin: No rashes, lesions, ulcers on limited skin evaluation. No induration; Warm and dry.  Neurologic: CN 2-12 grossly intact with no focal deficits. Romberg sign and cerebellar reflexes not assessed.  Psychiatric: Slightly impaired judgment and insight. Alert and is awake. Normal mood and appropriate affect.   Data Reviewed: I have personally reviewed following labs and imaging studies  CBC: Recent Labs  Lab 03/25/20 1450 03/27/20 0033 03/27/20 0532 03/28/20 0359 03/29/20 0455 03/30/20 0438 03/31/20 0445  WBC 7.2   < > 12.4* 9.3 9.0 12.1* 8.8  NEUTROABS 5.1  --   --  7.0 6.4  --  6.4  HGB 10.7*   < > 11.8* 11.1* 11.3* 8.8* 7.8*  HCT 32.5*   < > 35.6* 33.4* 34.0* 28.2* 24.8*  MCV 99.4   < > 98.3 97.7 97.4 102.5* 102.1*  PLT 309   < > 280 239 211 163 173   < > = values in this interval not displayed.   Basic Metabolic Panel: Recent Labs  Lab 03/27/20 0532 03/28/20 0359 03/29/20 0455 03/30/20 0438 03/31/20 0445  NA 137 135 136 136 138  K 4.3 4.4 4.8 5.1 4.5  CL 106 106 105 105 111  CO2 20* 20* 23 21* 20*   GLUCOSE 128* 114* 116* 114* 97  BUN 25* 25* 41* 43* 38*  CREATININE 1.35* 1.13* 1.27* 1.84* 1.46*  CALCIUM 9.5 9.4 9.7 8.6* 8.3*  MG  --  1.8 2.7* 2.5* 2.3  PHOS  --  3.1 2.5 4.4 2.5   GFR: Estimated Creatinine Clearance: 19.4 mL/min (A) (by C-G formula based on SCr of 1.46 mg/dL (H)). Liver Function Tests: Recent Labs  Lab 03/25/20 1233 03/28/20 0359 03/29/20 0455 03/30/20 0438 03/31/20 0445  AST 27 16 16 24 25   ALT 19 17 15 15  <5  ALKPHOS 78 64 58 45 50  BILITOT 0.8 0.8 0.6 0.2* 0.4  PROT 9.0* 7.4 7.5 6.2* 5.9*  ALBUMIN 4.3 3.5 3.6 3.0* 2.7*   No results for input(s): LIPASE, AMYLASE in the last 168 hours. No results for input(s): AMMONIA in the last 168 hours. Coagulation Profile: Recent Labs  Lab 03/25/20 1233 03/25/20 1450 03/27/20 0033  INR QUESTIONABLE RESULT/NEW SPEC REQ 0.9 0.9   Cardiac Enzymes: No results for input(s): CKTOTAL, CKMB, CKMBINDEX, TROPONINI in the last 168 hours. BNP (last 3 results) No results for input(s): PROBNP in the last 8760 hours. HbA1C: No results for input(s): HGBA1C in the last 72 hours. CBG: Recent Labs  Lab 03/25/20 1234  GLUCAP 95   Lipid Profile: No results for input(s): CHOL, HDL, LDLCALC, TRIG, CHOLHDL, LDLDIRECT in the last 72 hours. Thyroid Function Tests: No results for input(s): TSH, T4TOTAL, FREET4, T3FREE, THYROIDAB in the last 72 hours. Anemia Panel: Recent Labs    03/28/20 1031  VITAMINB12 601  FOLATE 22.4  FERRITIN 661*  TIBC 244*  IRON 61   Sepsis Labs: No results for input(s): PROCALCITON, LATICACIDVEN in the last 168 hours.  Recent Results (from the past 240 hour(s))  Resp Panel by RT-PCR (Flu A&B, Covid) Nasopharyngeal Swab     Status: None   Collection Time: 03/25/20 12:33 PM   Specimen: Nasopharyngeal Swab; Nasopharyngeal(NP) swabs in vial transport medium  Result Value Ref Range Status   SARS Coronavirus 2 by RT PCR NEGATIVE NEGATIVE Final    Comment: (NOTE) SARS-CoV-2 target nucleic  acids are NOT  DETECTED.  The SARS-CoV-2 RNA is generally detectable in upper respiratory specimens during the acute phase of infection. The lowest concentration of SARS-CoV-2 viral copies this assay can detect is 138 copies/mL. A negative result does not preclude SARS-Cov-2 infection and should not be used as the sole basis for treatment or other patient management decisions. A negative result may occur with  improper specimen collection/handling, submission of specimen other than nasopharyngeal swab, presence of viral mutation(s) within the areas targeted by this assay, and inadequate number of viral copies(<138 copies/mL). A negative result must be combined with clinical observations, patient history, and epidemiological information. The expected result is Negative.  Fact Sheet for Patients:  BloggerCourse.com  Fact Sheet for Healthcare Providers:  SeriousBroker.it  This test is no t yet approved or cleared by the [place] FDA and  has been authorized for detection and/or diagnosis of SARS-CoV-2 by FDA under an Emergency Use Authorization (EUA). This EUA will remain  in effect (meaning this test can be used) for the duration of the COVID-19 declaration under Section 564(b)(1) of the Act, 21 U.S.C.section 360bbb-3(b)(1), unless the authorization is terminated  or revoked sooner.       Influenza A by PCR NEGATIVE NEGATIVE Final   Influenza B by PCR NEGATIVE NEGATIVE Final    Comment: (NOTE) The Xpert Xpress SARS-CoV-2/FLU/RSV plus assay is intended as an aid in the diagnosis of influenza from Nasopharyngeal swab specimens and should not be used as a sole basis for treatment. Nasal washings and aspirates are unacceptable for Xpert Xpress SARS-CoV-2/FLU/RSV testing.  Fact Sheet for Patients: BloggerCourse.com  Fact Sheet for Healthcare Providers: SeriousBroker.it  This  test is not yet approved or cleared by the [place] FDA and has been authorized for detection and/or diagnosis of SARS-CoV-2 by FDA under an Emergency Use Authorization (EUA). This EUA will remain in effect (meaning this test can be used) for the duration of the COVID-19 declaration under Section 564(b)(1) of the Act, 21 U.S.C. section 360bbb-3(b)(1), unless the authorization is terminated or revoked.  Performed at [facility], [address], [place], [place] [address]   Resp Panel by RT-PCR (Flu A&B, Covid) Nasopharyngeal Swab     Status: None   Collection Time: 03/27/20  2:10 AM   Specimen: Nasopharyngeal Swab; Nasopharyngeal(NP) swabs in vial transport medium  Result Value Ref Range Status   SARS Coronavirus 2 by RT PCR NEGATIVE NEGATIVE Final    Comment: (NOTE) SARS-CoV-2 target nucleic acids are NOT DETECTED.  The SARS-CoV-2 RNA is generally detectable in upper respiratory specimens during the acute phase of infection. The lowest concentration of SARS-CoV-2 viral copies this assay can detect is 138 copies/mL. A negative result does not preclude SARS-Cov-2 infection and should not be used as the sole basis for treatment or other patient management decisions. A negative result may occur with  improper specimen collection/handling, submission of specimen other than nasopharyngeal swab, presence of viral mutation(s) within the areas targeted by this assay, and inadequate number of viral copies(<138 copies/mL). A negative result must be combined with clinical observations, patient history, and epidemiological information. The expected result is Negative.  Fact Sheet for Patients:  BloggerCourse.com  Fact Sheet for Healthcare Providers:  SeriousBroker.it  This test is no t yet approved or cleared by the [place] FDA and  has been authorized for detection and/or diagnosis of SARS-CoV-2 by FDA under  an Emergency Use Authorization (EUA). This EUA will remain  in effect (meaning this test can be used) for  the duration of the COVID-19 declaration under Section 564(b)(1) of the Act, 21 U.S.C.section 360bbb-3(b)(1), unless the authorization is terminated  or revoked sooner.       Influenza A by PCR NEGATIVE NEGATIVE Final   Influenza B by PCR NEGATIVE NEGATIVE Final    Comment: (NOTE) The Xpert Xpress SARS-CoV-2/FLU/RSV plus assay is intended as an aid in the diagnosis of influenza from Nasopharyngeal swab specimens and should not be used as a sole basis for treatment. Nasal washings and aspirates are unacceptable for Xpert Xpress SARS-CoV-2/FLU/RSV testing.  Fact Sheet for Patients: BloggerCourse.com  Fact Sheet for Healthcare Providers: SeriousBroker.it  This test is not yet approved or cleared by the [place] FDA and has been authorized for detection and/or diagnosis of SARS-CoV-2 by FDA under an Emergency Use Authorization (EUA). This EUA will remain in effect (meaning this test can be used) for the duration of the COVID-19 declaration under Section 564(b)(1) of the Act, 21 U.S.C. section 360bbb-3(b)(1), unless the authorization is terminated or revoked.  Performed at [facility], [address] [place], [place] [address]   MRSA PCR Screening     Status: None   Collection Time: 03/29/20  5:38 AM   Specimen: Nasal Mucosa; Nasopharyngeal  Result Value Ref Range Status   MRSA by PCR NEGATIVE NEGATIVE Final    Comment:        The GeneXpert MRSA Assay (FDA approved for NASAL specimens only), is one component of a comprehensive MRSA colonization surveillance program. It is not intended to diagnose MRSA infection nor to guide or monitor treatment for MRSA infections. Performed at [facility], [address], [place], [place] [address]      RN Pressure Injury  Documentation:   Estimated body mass index is 16.36 kg/m as calculated from the following:   Height as of this encounter: 5' (1.524 m).   Weight as of this encounter: 38 kg.  Malnutrition Type:  Nutrition Problem: Severe Malnutrition Etiology: chronic illness (dementia)  Malnutrition Characteristics:  Signs/Symptoms: moderate fat depletion,severe muscle depletion  Nutrition Interventions:  Interventions: Ensure Surgery,MVI   Radiology Studies: Pelvis Portable  Result Date: 03/29/2020 CLINICAL DATA:  Post left hip arthroplasty. EXAM: PORTABLE PELVIS 1-2 VIEWS COMPARISON:  03/26/2020 FINDINGS: Interval placement of left hip arthroplasty intact and normally located. Skin staples over the lateral soft tissues of the left hip. Remainder the exam is unchanged. IMPRESSION: Expected changes post left hip arthroplasty. Electronically Signed   By: Elberta Fortis M.D.   On: 03/29/2020 16:07   DG C-Arm 1-60 Min-No Report  Result Date: 03/29/2020 Fluoroscopy was utilized by the requesting physician.  No radiographic interpretation.   DG HIP OPERATIVE UNILAT W OR W/O PELVIS LEFT  Result Date: 03/29/2020 CLINICAL DATA:  Left hip fracture post arthroplasty. EXAM: OPERATIVE left HIP (WITH PELVIS IF PERFORMED) 2 VIEWS TECHNIQUE: Fluoroscopic spot image(s) were submitted for interpretation post-operatively. COMPARISON:  03/26/2020 FINDINGS: Examination demonstrates interval placement of left hip arthroplasty intact and normally located. Partially visualized right femoral hardware intact and unchanged. IMPRESSION: Expected changes post left hip arthroplasty. Electronically Signed   By: Elberta Fortis M.D.   On: 03/29/2020 14:35   Scheduled Meds: . aspirin EC  325 mg Oral Q breakfast  . carvedilol  12.5 mg Oral BID WC  . docusate sodium  100 mg Oral BID  . donepezil  5 mg Oral Daily  . feeding supplement  237 mL Oral BID BM  . lacosamide  200 mg Oral BID AC  .  levETIRAcetam  1,000 mg Oral BID  .  multivitamin with minerals  1 tablet Oral Daily  . pneumococcal 23 valent vaccine  0.5 mL Intramuscular Tomorrow-1000   Continuous Infusions: . sodium chloride 75 mL/hr at 03/31/20 0026    LOS: 4 days   Merlene Laughter, DO [facility] PAGER is on AMION  If 7PM-7AM, please contact night-coverage [email or web address]

## 2020-03-31 NOTE — Procedures (Signed)
Patient Name: [name]  MRN: [id number]  Epilepsy Attending: Charlsie Quest  Referring Physician/Provider: Dr Haig Prophet Date: 03/31/2020 Duration: 22.29 mins  Patient history: 80yo F with h/o epilepsy noted to have intermittent twitching and jerking. EEG to evaluate for seizure  Level of alertness: Awake  AEDs during EEG study: LEV, LCM  Technical aspects: This EEG study was done with scalp electrodes positioned according to the 10-20 International system of electrode placement. Electrical activity was acquired at a sampling rate of 500Hz  and reviewed with a high frequency filter of 70Hz  and a low frequency filter of 1Hz . EEG data were recorded continuously and digitally stored.   Description: The posterior dominant rhythm consists of 7.5 Hz activity of moderate voltage (25-35 uV) seen predominantly in posterior head regions, symmetric and reactive to eye opening and eye closing. EEG showed continuous generalized 3 to 6 Hz theta-delta slowing. Hyperventilation and photic stimulation were not performed.     Patient was noted to have right hand/thumb non rhythmic twitching. Concomitant eeg before, during and after the event didn't show any eeg change to suggest sezure.  ABNORMALITY -Continuous slow, generalized  IMPRESSION: This study is suggestive of mild to moderate  diffuse encephalopathy, nonspecific etiology. No seizures or epileptiform discharges were seen throughout the recording.  Patient was noted to have right hand/thumb non rhythmic twitching without concomitant eeg and was most likely NOT epileptic.   If suspicion for interictal activity remains a concern, a prolonged study should be considered.   Lyndi Holbein 

## 2020-03-31 NOTE — TOC Progression Note (Signed)
Transition of Care [facility]) - Progression Note    Patient Details  Name: [name] MRN: [id number] Date of Birth: 1941-01-01  Transition of Care [facility]) CM/SW Contact  Armanda Heritage, RN Phone Number: 03/31/2020, 2:54 PM  Clinical Narrative:    CM spoke with patient's son and provided bed offers, initially [facility] had extended a bed offer which son accepted, CM notified facility of choice and noted when starting insurance auth that the bed offer was retracted.  CM spoke with son and notified that [facility] no [facility] available, Son Water quality scientist living instead.  CM spoke with [facility] rep and notified of choice.  Insurance auth initiated and requested clinicals faxed, reference # [id number] (per insurance patient is managed by [facility]).    Expected Discharge Plan: Skilled Nursing Facility Barriers to Discharge: Continued Medical Work up  Expected Discharge Plan and Services Expected Discharge Plan: Skilled Nursing Facility In-house Referral: Clinical Social Work Discharge Planning Services: CM Consult Post Acute Care Choice: Skilled Nursing Facility Living arrangements for the past 2 months: Single Family Home                                       Social Determinants of Health (SDOH) Interventions    Readmission Risk Interventions No flowsheet data found.

## 2020-03-31 NOTE — Progress Notes (Signed)
EEG complete - results pending 

## 2020-04-01 ENCOUNTER — Other Ambulatory Visit: Payer: Self-pay | Admitting: Orthopedic Surgery

## 2020-04-01 DIAGNOSIS — Z8781 Personal history of (healed) traumatic fracture: Secondary | ICD-10-CM | POA: Diagnosis not present

## 2020-04-01 DIAGNOSIS — G40909 Epilepsy, unspecified, not intractable, without status epilepticus: Secondary | ICD-10-CM

## 2020-04-01 DIAGNOSIS — S72002D Fracture of unspecified part of neck of left femur, subsequent encounter for closed fracture with routine healing: Secondary | ICD-10-CM | POA: Diagnosis not present

## 2020-04-01 DIAGNOSIS — Z743 Need for continuous supervision: Secondary | ICD-10-CM | POA: Diagnosis not present

## 2020-04-01 DIAGNOSIS — D62 Acute posthemorrhagic anemia: Secondary | ICD-10-CM | POA: Diagnosis not present

## 2020-04-01 DIAGNOSIS — N1832 Chronic kidney disease, stage 3b: Secondary | ICD-10-CM | POA: Diagnosis not present

## 2020-04-01 DIAGNOSIS — S72031D Displaced midcervical fracture of right femur, subsequent encounter for closed fracture with routine healing: Secondary | ICD-10-CM | POA: Diagnosis not present

## 2020-04-01 DIAGNOSIS — I69391 Dysphagia following cerebral infarction: Secondary | ICD-10-CM | POA: Diagnosis not present

## 2020-04-01 DIAGNOSIS — D649 Anemia, unspecified: Secondary | ICD-10-CM | POA: Diagnosis not present

## 2020-04-01 DIAGNOSIS — S72032D Displaced midcervical fracture of left femur, subsequent encounter for closed fracture with routine healing: Secondary | ICD-10-CM | POA: Diagnosis not present

## 2020-04-01 DIAGNOSIS — Z96642 Presence of left artificial hip joint: Secondary | ICD-10-CM | POA: Diagnosis not present

## 2020-04-01 DIAGNOSIS — G309 Alzheimer's disease, unspecified: Secondary | ICD-10-CM | POA: Diagnosis not present

## 2020-04-01 DIAGNOSIS — R2681 Unsteadiness on feet: Secondary | ICD-10-CM | POA: Diagnosis not present

## 2020-04-01 DIAGNOSIS — I5042 Chronic combined systolic (congestive) and diastolic (congestive) heart failure: Secondary | ICD-10-CM | POA: Diagnosis not present

## 2020-04-01 DIAGNOSIS — I69351 Hemiplegia and hemiparesis following cerebral infarction affecting right dominant side: Secondary | ICD-10-CM | POA: Diagnosis not present

## 2020-04-01 DIAGNOSIS — S72002A Fracture of unspecified part of neck of left femur, initial encounter for closed fracture: Secondary | ICD-10-CM | POA: Diagnosis not present

## 2020-04-01 DIAGNOSIS — F0280 Dementia in other diseases classified elsewhere without behavioral disturbance: Secondary | ICD-10-CM | POA: Diagnosis not present

## 2020-04-01 DIAGNOSIS — I10 Essential (primary) hypertension: Secondary | ICD-10-CM | POA: Diagnosis not present

## 2020-04-01 DIAGNOSIS — R1312 Dysphagia, oropharyngeal phase: Secondary | ICD-10-CM | POA: Diagnosis not present

## 2020-04-01 DIAGNOSIS — R410 Disorientation, unspecified: Secondary | ICD-10-CM | POA: Diagnosis not present

## 2020-04-01 DIAGNOSIS — Z8673 Personal history of transient ischemic attack (TIA), and cerebral infarction without residual deficits: Secondary | ICD-10-CM | POA: Diagnosis not present

## 2020-04-01 DIAGNOSIS — R52 Pain, unspecified: Secondary | ICD-10-CM | POA: Diagnosis not present

## 2020-04-01 DIAGNOSIS — Z9181 History of falling: Secondary | ICD-10-CM | POA: Diagnosis not present

## 2020-04-01 DIAGNOSIS — Z20822 Contact with and (suspected) exposure to covid-19: Secondary | ICD-10-CM | POA: Diagnosis not present

## 2020-04-01 DIAGNOSIS — M6281 Muscle weakness (generalized): Secondary | ICD-10-CM | POA: Diagnosis not present

## 2020-04-01 DIAGNOSIS — R279 Unspecified lack of coordination: Secondary | ICD-10-CM | POA: Diagnosis not present

## 2020-04-01 LAB — CBC WITH DIFFERENTIAL/PLATELET
Abs Immature Granulocytes: 0.05 10*3/uL (ref 0.00–0.07)
Basophils Absolute: 0 10*3/uL (ref 0.0–0.1)
Basophils Relative: 0 %
Eosinophils Absolute: 0.1 10*3/uL (ref 0.0–0.5)
Eosinophils Relative: 1 %
HCT: 25.4 % — ABNORMAL LOW (ref 36.0–46.0)
Hemoglobin: 8.2 g/dL — ABNORMAL LOW (ref 12.0–15.0)
Immature Granulocytes: 1 %
Lymphocytes Relative: 14 %
Lymphs Abs: 1.3 10*3/uL (ref 0.7–4.0)
MCH: 32.8 pg (ref 26.0–34.0)
MCHC: 32.3 g/dL (ref 30.0–36.0)
MCV: 101.6 fL — ABNORMAL HIGH (ref 80.0–100.0)
Monocytes Absolute: 0.9 10*3/uL (ref 0.1–1.0)
Monocytes Relative: 10 %
Neutro Abs: 6.6 10*3/uL (ref 1.7–7.7)
Neutrophils Relative %: 74 %
Platelets: 180 10*3/uL (ref 150–400)
RBC: 2.5 MIL/uL — ABNORMAL LOW (ref 3.87–5.11)
RDW: 17.2 % — ABNORMAL HIGH (ref 11.5–15.5)
WBC: 8.9 10*3/uL (ref 4.0–10.5)
nRBC: 0 % (ref 0.0–0.2)

## 2020-04-01 LAB — COMPREHENSIVE METABOLIC PANEL
ALT: 9 U/L (ref 0–44)
AST: 32 U/L (ref 15–41)
Albumin: 2.7 g/dL — ABNORMAL LOW (ref 3.5–5.0)
Alkaline Phosphatase: 50 U/L (ref 38–126)
Anion gap: 9 (ref 5–15)
BUN: 37 mg/dL — ABNORMAL HIGH (ref 8–23)
CO2: 21 mmol/L — ABNORMAL LOW (ref 22–32)
Calcium: 8.7 mg/dL — ABNORMAL LOW (ref 8.9–10.3)
Chloride: 108 mmol/L (ref 98–111)
Creatinine, Ser: 1.36 mg/dL — ABNORMAL HIGH (ref 0.44–1.00)
GFR, Estimated: 40 mL/min — ABNORMAL LOW (ref >60)
Glucose, Bld: 183 mg/dL — ABNORMAL HIGH (ref 70–99)
Potassium: 4.4 mmol/L (ref 3.5–5.1)
Sodium: 138 mmol/L (ref 135–145)
Total Bilirubin: 0.4 mg/dL (ref 0.3–1.2)
Total Protein: 6 g/dL — ABNORMAL LOW (ref 6.5–8.1)

## 2020-04-01 LAB — PHOSPHORUS: Phosphorus: 2 mg/dL — ABNORMAL LOW (ref 2.5–4.6)

## 2020-04-01 LAB — LEVETIRACETAM LEVEL: Levetiracetam Lvl: 137.9 ug/mL — ABNORMAL HIGH (ref 10.0–40.0)

## 2020-04-01 LAB — MAGNESIUM: Magnesium: 2.2 mg/dL (ref 1.7–2.4)

## 2020-04-01 LAB — SARS CORONAVIRUS 2 (TAT 6-24 HRS): SARS Coronavirus 2: NEGATIVE

## 2020-04-01 MED ORDER — K PHOS MONO-SOD PHOS DI & MONO 155-852-130 MG PO TABS
500.0000 mg | ORAL_TABLET | Freq: Two times a day (BID) | ORAL | Status: DC
  Filled 2020-04-01: qty 2

## 2020-04-01 MED ORDER — K PHOS MONO-SOD PHOS DI & MONO 155-852-130 MG PO TABS
250.0000 mg | ORAL_TABLET | Freq: Two times a day (BID) | ORAL | 0 refills | Status: DC
Start: 2020-04-01 — End: 2020-04-03

## 2020-04-01 MED ORDER — LACOSAMIDE 200 MG PO TABS: 200.0000 mg | ORAL_TABLET | Freq: Two times a day (BID) | ORAL | 1 refills | Status: DC

## 2020-04-01 NOTE — TOC Transition Note (Addendum)
Transition of Care [facility]) - CM/SW Discharge Note   Patient Details  Name: Evelina Lore MRN: [id number] Date of Birth: 08-08-1940  Transition of Care [facility]) CM/SW Contact:  Clearance Coots, LCSW Phone Number: 04/01/2020, 12:03 PM   Clinical Narrative:    [facility] Ready to accept the patient today.  CSW was notified by [facility] the Patient is not managed by [facility]. The auth. Reference number was discontinued. / Authorization was initiated by [employer] today and received on today. D/C Summary sent to the SNF.  Son Greggory Stallion to Complete consent at SNF today prior to pt. Arrival.  [facility] to transport.  Nurse call report to: [phone] Room 107.  Final next level of care: Skilled Nursing Facility Barriers to Discharge: Barriers Resolved   Patient Goals and CMS Choice Patient states their goals for this hospitalization and ongoing recovery are:: Per son, go to rehab CMS Medicare.gov Compare Post Acute Care list provided to:: Patient Represenative (must comment) Choice offered to / list presented to : Adult Children  Discharge Placement   Existing PASRR number confirmed : 03/30/20          Patient chooses bed at: [facility] Patient to be transferred to facility by: [facility] Name of family member notified: Son Greggory Stallion Patient and family notified of of transfer: 04/01/20  Discharge Plan and Services In-house Referral: Clinical Social Work Discharge Planning Services: [employer] Consult Post Acute Care Choice: Skilled Nursing Facility                               Social Determinants of Health (SDOH) Interventions     Readmission Risk Interventions No flowsheet data found.

## 2020-04-01 NOTE — Discharge Summary (Signed)
Physician Discharge Summary  [name] EAV:[id number] DOB: 12-02-1940 DOA: 03/26/2020  PCP: [name], MD  Admit date: 03/26/2020 Discharge date: 04/01/2020  Time spent: 50* minutes  Recommendations for Outpatient Follow-up:  1. Patient will be discharged on K-Phos Neutral to 50 mg twice a day for 2 days. 2. Recheck phosphorus level on Monday, 04/06/2020. 3. Follow-up orthopedic surgery in 2 weeks   Discharge Diagnoses:  Principal Problem:   Closed fracture of left hip (HCC) Active Problems:   Seizure disorder (HCC)   H/O: CVA (cerebrovascular accident)   Chronic combined systolic and diastolic congestive heart failure (HCC)   Chronic kidney disease, stage 3b (HCC)   Alzheimer's dementia without behavioral disturbance (HCC)   SIRS (systemic inflammatory response syndrome) (HCC)   Discharge Condition: Stable  Diet recommendation: Regular diet  Filed Weights   03/26/20 2229  Weight: 38 kg    History of present illness:  80 year old female with a history of dementia, chronic combined Solik and diastolic CHF, nonobstructive CAD, CKD stage IIIb, history of CVA, seizure disorder, hypertension came to ED after a fall.  In the ED she was found to have acute proximal left femur fracture.  Orthopedics was consulted.  Hospital Course:  Acute left femur fracture-patient underwent left total hip arthroplasty per orthopedics.  She was started on aspirin 81 mg p.o. twice daily for DVT prophylaxis as per orthopedics.  Patient will be discharged to skilled nursing facility for physical therapy.  Pain well controlled.  Continue Vicodin as needed for pain.  Patient to follow-up orthopedics in 2 weeks.  History of seizures-patient has history of seizures, she had intermittent twitching of the muscles.  EEG was performed which was unremarkable.  At this time continue with Vimpat, Keppra.  No change in antiseizure medications.  Chronic combined systolic and diastolic CHF-patient's  echocardiogram from September 2021 showed EF of 30 to 35%.  Continue home medications.  Patient is euvolemic.  Acute kidney injury on CKD stage IIIb-patient's creatinine was elevated at 1.52 on admission.  Likely from poor p.o. intake/dehydration.  Creatinine has improved today.  Today creatinine is 1.35.  Hypophosphatemia-phosphorus level today was found to be 2.0.  Will discharge on K-Phos Neutral to 50 mg p.o. twice daily for 2 days.  Stop phosphorus supplementation after 04/03/2020.  Recheck phosphorus level on Monday, 04/06/2020.  Hypertension-blood pressures controlled, continue amlodipine, Coreg, losartan.  Normocytic anemia-likely in setting of CKD and acute blood loss anemia in setting of postop drop.  Hemoglobin is stable.Check anemia panel ordered an iron level of 61, U IBC 183, TIBC of 244, saturation ratios of 25%, ferritin level 661, folate level 22.4, vitamin B12 601.  Dementia-continue donepezil 5 mg p.o. daily.  History of CVA-continue aspirin as ordered.    Procedures:  Left total hip arthroplasty  Consultations:  Orthopedics  Discharge Exam: Vitals:   04/01/20 0519 04/01/20 0900  BP: (!) 147/64 (!) 150/78  Pulse: 77 80  Resp: 16   Temp: 98.8 F (37.1 C)   SpO2: 97%     General: Appears in no acute distress Cardiovascular: S1-S2, regular Respiratory: Clear to auscultation bilaterally  Discharge Instructions   Discharge Instructions    Diet - low sodium heart healthy   Complete by: As directed    Increase activity slowly   Complete by: As directed    No wound care   Complete by: As directed      Allergies as of 04/01/2020      Reactions   Budesonide-formoterol Fumarate  Other reaction(s): Sore throat   Lisinopril    Other reaction(s): nausea   Mirtazapine    Other reaction(s): Severe Headaches   Sodium Bicarbonate    Other reaction(s): Makes her vomit      Medication List    STOP taking these medications   acetaminophen 500 MG  tablet Commonly known as: TYLENOL   heparin 5000 UNIT/ML injection     TAKE these medications   Ensure Take 237 mLs by mouth 3 (three) times daily between meals.   (feeding supplement) PROSource Plus liquid Take 30 mLs by mouth 2 (two) times daily between meals.   allopurinol 100 MG tablet Commonly known as: ZYLOPRIM Take 100 mg by mouth daily.   amLODipine 10 MG tablet Commonly known as: NORVASC Take 10 mg by mouth daily.   aspirin 81 MG chewable tablet Commonly known as: Aspirin Childrens Chew 1 tablet (81 mg total) by mouth 2 (two) times daily with a meal.   carvedilol 12.5 MG tablet Commonly known as: COREG Take 12.5 mg by mouth 2 (two) times daily with a meal.   docusate sodium 100 MG capsule Commonly known as: COLACE Take 1 capsule (100 mg total) by mouth 2 (two) times daily. What changed:   when to take this  reasons to take this   donepezil 5 MG tablet Commonly known as: ARICEPT Take 5 mg by mouth daily.   FeroSul 325 (65 FE) MG tablet Generic drug: ferrous sulfate Take 325 mg by mouth every morning.   HYDROcodone-acetaminophen 5-325 MG tablet Commonly known as: NORCO/VICODIN Take 1-2 tablets by mouth every 4 (four) hours as needed for moderate pain (pain score 4-6). What changed:   how much to take  when to take this  reasons to take this  additional instructions   lacosamide 200 MG Tabs tablet Commonly known as: Vimpat Take 1 tablet (200 mg total) by mouth 2 (two) times daily. What changed: Another medication with the same name was removed. Continue taking this medication, and follow the directions you see here.   levETIRAcetam 1000 MG tablet Commonly known as: KEPPRA Take 1 tablet (1,000 mg total) by mouth 2 (two) times daily. What changed: Another medication with the same name was removed. Continue taking this medication, and follow the directions you see here.   losartan 100 MG tablet Commonly known as: COZAAR Take 100 mg by mouth  daily.   multivitamin with minerals Tabs tablet Take 1 tablet by mouth daily.   phosphorus 155-852-130 MG tablet Commonly known as: K PHOS NEUTRAL Take 1 tablet (250 mg total) by mouth 2 (two) times daily for 2 days.   polyethylene glycol 17 g packet Commonly known as: MIRALAX / GLYCOLAX Take 17 g by mouth daily as needed for mild constipation.      Allergies  Allergen Reactions  . Budesonide-Formoterol Fumarate     Other reaction(s): Sore throat  . Lisinopril     Other reaction(s): nausea  . Mirtazapine     Other reaction(s): Severe Headaches  . Sodium Bicarbonate     Other reaction(s): Makes her vomit    Contact information for follow-up providers    Swinteck, Arlys John, MD. Schedule an appointment as soon as possible for a visit in 2 weeks.   Specialty: Orthopedic Surgery Why: For wound re-check, For suture removal Contact information: [address] [place] [address] [phone]            Contact information for after-discharge care    Destination  [facility] Preferred SNF .   Service: Skilled Nursing Contact information: [address] [place] [id number] [phone]                   The results of significant diagnostics from this hospitalization (including imaging, microbiology, ancillary and laboratory) are listed below for reference.    Significant Diagnostic Studies: DG Chest 1 View  Result Date: 03/27/2020 CLINICAL DATA:  Larey SeatFell from the bed. EXAM: CHEST  1 VIEW COMPARISON:  01/04/2020 FINDINGS: Chronic cardiomegaly and aortic atherosclerosis. Possible pulmonary venous hypertension but no frank edema. No consolidation or collapse. No effusion. No acute fracture identified. Chronic/healing fracture of the right humeral head. IMPRESSION: Cardiomegaly and aortic atherosclerosis. Possible pulmonary venous hypertension. No frank edema. Electronically Signed   By: Paulina FusiMark  Shogry M.D.   On:  03/27/2020 00:00   CT HEAD WO CONTRAST  Result Date: 03/27/2020 CLINICAL DATA:  Status post fall. EXAM: CT HEAD WITHOUT CONTRAST CT CERVICAL SPINE WITHOUT CONTRAST TECHNIQUE: Multidetector CT imaging of the head and cervical spine was performed following the standard protocol without intravenous contrast. Multiplanar CT image reconstructions of the cervical spine were also generated. COMPARISON:  None. FINDINGS: CT HEAD FINDINGS Brain: There is mild cerebral atrophy with widening of the extra-axial spaces and ventricular dilatation. There are areas of decreased attenuation within the white matter tracts of the supratentorial brain, consistent with microvascular disease changes. Vascular: No hyperdense vessel or unexpected calcification. Skull: Normal. Negative for fracture or focal lesion. Sinuses/Orbits: No acute finding. Other: None. CT CERVICAL SPINE FINDINGS Alignment: There is approximately 1 mm to 2 mm retrolisthesis of the C4 vertebral body on C5. Skull base and vertebrae: No acute fracture. No primary bone lesion or focal pathologic process. Soft tissues and spinal canal: No prevertebral fluid or swelling. No visible canal hematoma. Disc levels: Mild to moderate severity endplate sclerosis is seen at the levels of C2-C3, and C4-C5. Mild anterior osteophyte formation is present at the levels of C3-C4 and C5-C6. Moderate severity intervertebral disc space narrowing is seen at the levels of C2-C3, C4-C5 and C5-C6. Normal bilateral multilevel facet joints are noted. Upper chest: Negative. Other: N/A IMPRESSION: 1. Mild cerebral atrophy and microvascular disease changes of the supratentorial brain. 2. No evidence of an acute fracture or subluxation of the cervical spine. 3. Moderate severity degenerative changes of the cervical spine, as described above. Electronically Signed   By: Aram Candelahaddeus  Houston M.D.   On: 03/27/2020 00:16   CT HEAD WO CONTRAST  Result Date: 03/25/2020 CLINICAL DATA:  Mental status  change EXAM: CT HEAD WITHOUT CONTRAST TECHNIQUE: Contiguous axial images were obtained from the base of the skull through the vertex without intravenous contrast. COMPARISON:  03/22/2020 FINDINGS: Brain: There is no acute intracranial hemorrhage, mass effect, or edema. No new loss of gray-white differentiation. There is no extra-axial fluid collection. Chronic parasagittal left frontoparietal infarct. Confluent areas of hypoattenuation in the supratentorial white matter likely reflecting stable chronic microvascular ischemic changes. Ventricles and sulci are stable in size and configuration. Vascular: No hyperdense vessel.There is atherosclerotic calcification at the skull base. Skull: Calvarium is unremarkable. Sinuses/Orbits: No acute finding. Other: None. IMPRESSION: No acute intracranial abnormality. Stable chronic/nonemergent findings detailed above. Electronically Signed   By: Guadlupe SpanishPraneil  Patel M.D.   On: 03/25/2020 14:52   CT HEAD WO CONTRAST  Result Date: 03/22/2020 CLINICAL DATA:  Transient ischemic attack. EXAM: CT HEAD WITHOUT CONTRAST TECHNIQUE: Contiguous axial images were obtained from the base of the  skull through the vertex without intravenous contrast. COMPARISON:  January 04, 2020 FINDINGS: Brain: There is mild to moderate severity cerebral atrophy with widening of the extra-axial spaces and ventricular dilatation. There are areas of decreased attenuation within the white matter tracts of the supratentorial brain, consistent with microvascular disease changes. Vascular: No hyperdense vessel or unexpected calcification. Skull: Normal. Negative for fracture or focal lesion. Sinuses/Orbits: No acute finding. Other: None. IMPRESSION: 1. Generalized cerebral atrophy. 2. No acute intracranial abnormality. Electronically Signed   By: Aram Candela M.D.   On: 03/22/2020 01:06   CT CERVICAL SPINE WO CONTRAST  Result Date: 03/27/2020 CLINICAL DATA:  Status post fall. EXAM: CT HEAD WITHOUT CONTRAST  CT CERVICAL SPINE WITHOUT CONTRAST TECHNIQUE: Multidetector CT imaging of the head and cervical spine was performed following the standard protocol without intravenous contrast. Multiplanar CT image reconstructions of the cervical spine were also generated. COMPARISON:  None. FINDINGS: CT HEAD FINDINGS Brain: There is mild cerebral atrophy with widening of the extra-axial spaces and ventricular dilatation. There are areas of decreased attenuation within the white matter tracts of the supratentorial brain, consistent with microvascular disease changes. Vascular: No hyperdense vessel or unexpected calcification. Skull: Normal. Negative for fracture or focal lesion. Sinuses/Orbits: No acute finding. Other: None. CT CERVICAL SPINE FINDINGS Alignment: There is approximately 1 mm to 2 mm retrolisthesis of the C4 vertebral body on C5. Skull base and vertebrae: No acute fracture. No primary bone lesion or focal pathologic process. Soft tissues and spinal canal: No prevertebral fluid or swelling. No visible canal hematoma. Disc levels: Mild to moderate severity endplate sclerosis is seen at the levels of C2-C3, and C4-C5. Mild anterior osteophyte formation is present at the levels of C3-C4 and C5-C6. Moderate severity intervertebral disc space narrowing is seen at the levels of C2-C3, C4-C5 and C5-C6. Normal bilateral multilevel facet joints are noted. Upper chest: Negative. Other: N/A IMPRESSION: 1. Mild cerebral atrophy and microvascular disease changes of the supratentorial brain. 2. No evidence of an acute fracture or subluxation of the cervical spine. 3. Moderate severity degenerative changes of the cervical spine, as described above. Electronically Signed   By: Aram Candela M.D.   On: 03/27/2020 00:16   Pelvis Portable  Result Date: 03/29/2020 CLINICAL DATA:  Post left hip arthroplasty. EXAM: PORTABLE PELVIS 1-2 VIEWS COMPARISON:  03/26/2020 FINDINGS: Interval placement of left hip arthroplasty intact and  normally located. Skin staples over the lateral soft tissues of the left hip. Remainder the exam is unchanged. IMPRESSION: Expected changes post left hip arthroplasty. Electronically Signed   By: Elberta Fortis M.D.   On: 03/29/2020 16:07   DG Knee Complete 4 Views Left  Result Date: 03/27/2020 CLINICAL DATA:  Status post fall. EXAM: LEFT KNEE - COMPLETE 4+ VIEW COMPARISON:  None. FINDINGS: No evidence of fracture, dislocation, or joint effusion. No evidence of arthropathy or other focal bone abnormality. Soft tissues are unremarkable. IMPRESSION: Negative. Electronically Signed   By: Aram Candela M.D.   On: 03/27/2020 00:04   EEG adult  Result Date: 03/31/2020 Charlsie Quest, MD     03/31/2020  3:49 PM Patient Name: Shaynia Alen MRN: [id number] Epilepsy Attending: Charlsie Quest Referring Physician/Provider: Dr Haig Prophet Date: 03/31/2020 Duration: 22.29 mins Patient history: 80yo F with h/o epilepsy noted to have intermittent twitching and jerking. EEG to evaluate for seizure Level of alertness: Awake AEDs during EEG study: LEV, LCM Technical aspects: This EEG study was done with scalp electrodes positioned according to  the 10-20 International system of electrode placement. Electrical activity was acquired at a sampling rate of 500Hz  and reviewed with a high frequency filter of 70Hz  and a low frequency filter of 1Hz . EEG data were recorded continuously and digitally stored. Description: The posterior dominant rhythm consists of 7.5 Hz activity of moderate voltage (25-35 uV) seen predominantly in posterior head regions, symmetric and reactive to eye opening and eye closing. EEG showed continuous generalized 3 to 6 Hz theta-delta slowing. Hyperventilation and photic stimulation were not performed.   Patient was noted to have right hand/thumb non rhythmic twitching. Concomitant eeg before, during and after the event didn't show any eeg change to suggest sezure. ABNORMALITY -Continuous  slow, generalized IMPRESSION: This study is suggestive of mild to moderate  diffuse encephalopathy, nonspecific etiology. No seizures or epileptiform discharges were seen throughout the recording. Patient was noted to have right hand/thumb non rhythmic twitching without concomitant eeg and was most likely NOT epileptic. If suspicion for interictal activity remains a concern, a prolonged study should be considered.   DG C-Arm 1-60 Min-No Report  Result Date: 03/29/2020 Fluoroscopy was utilized by the requesting physician.  No radiographic interpretation.   DG HIP OPERATIVE UNILAT W OR W/O PELVIS LEFT  Result Date: 03/29/2020 CLINICAL DATA:  Left hip fracture post arthroplasty. EXAM: OPERATIVE left HIP (WITH PELVIS IF PERFORMED) 2 VIEWS TECHNIQUE: Fluoroscopic spot image(s) were submitted for interpretation post-operatively. COMPARISON:  03/26/2020 FINDINGS: Examination demonstrates interval placement of left hip arthroplasty intact and normally located. Partially visualized right femoral hardware intact and unchanged. IMPRESSION: Expected changes post left hip arthroplasty. Electronically Signed   By: 03/31/2020 M.D.   On: 03/29/2020 14:35   DG Hip Unilat W or Wo Pelvis 2-3 Views Left  Result Date: 03/27/2020 CLINICAL DATA:  Status post fall. EXAM: DG HIP (WITH OR WITHOUT PELVIS) 2-3V LEFT COMPARISON:  None. FINDINGS: Acute fracture deformity is seen extending through the neck of the proximal left femur. Approximately 1 shaft with superior displacement of the distal fracture site is noted. A radiopaque intramedullary rod and compression screw device are seen within the proximal right femur. There is no evidence of dislocation. There is no evidence of arthropathy or other focal bone abnormality. IMPRESSION: Acute fracture of the proximal left femur. Electronically Signed   By: Elberta Fortis M.D.   On: 03/27/2020 00:04    Microbiology: Recent Results (from the past 240 hour(s))   Resp Panel by RT-PCR (Flu A&B, Covid) Nasopharyngeal Swab     Status: None   Collection Time: 03/25/20 12:33 PM   Specimen: Nasopharyngeal Swab; Nasopharyngeal(NP) swabs in vial transport medium  Result Value Ref Range Status   SARS Coronavirus 2 by RT PCR NEGATIVE NEGATIVE Final    Comment: (NOTE) SARS-CoV-2 target nucleic acids are NOT DETECTED.  The SARS-CoV-2 RNA is generally detectable in upper respiratory specimens during the acute phase of infection. The lowest concentration of SARS-CoV-2 viral copies this assay can detect is 138 copies/mL. A negative result does not preclude SARS-Cov-2 infection and should not be used as the sole basis for treatment or other patient management decisions. A negative result may occur with  improper specimen collection/handling, submission of specimen other than nasopharyngeal swab, presence of viral mutation(s) within the areas targeted by this assay, and inadequate number of viral copies(<138 copies/mL). A negative result must be combined with clinical observations, patient history, and epidemiological information. The expected result is Negative.  Fact Sheet for Patients:  Aram Candela  Fact Sheet  for Healthcare Providers:  SeriousBroker.it  This test is no t yet approved or cleared by the [place] and  has been authorized for detection and/or diagnosis of SARS-CoV-2 by FDA under an Emergency Use Authorization (EUA). This EUA will remain  in effect (meaning this test can be used) for the duration of the COVID-19 declaration under Section 564(b)(1) of the Act, 21 U.S.C.section 360bbb-3(b)(1), unless the authorization is terminated  or revoked sooner.       Influenza A by PCR NEGATIVE NEGATIVE Final   Influenza B by PCR NEGATIVE NEGATIVE Final    Comment: (NOTE) The Xpert Xpress SARS-CoV-2/FLU/RSV plus assay is intended as an aid in the diagnosis of influenza from  Nasopharyngeal swab specimens and should not be used as a sole basis for treatment. Nasal washings and aspirates are unacceptable for Xpert Xpress SARS-CoV-2/FLU/RSV testing.  Fact Sheet for Patients: BloggerCourse.com  Fact Sheet for Healthcare Providers: SeriousBroker.it  This test is not yet approved or cleared by the [place] FDA and has been authorized for detection and/or diagnosis of SARS-CoV-2 by FDA under an Emergency Use Authorization (EUA). This EUA will remain in effect (meaning this test can be used) for the duration of the COVID-19 declaration under Section 564(b)(1) of the Act, 21 U.S.C. section 360bbb-3(b)(1), unless the authorization is terminated or revoked.  Performed at [facility], [address] [place], [place] [address]   Resp Panel by RT-PCR (Flu A&B, Covid) Nasopharyngeal Swab     Status: None   Collection Time: 03/27/20  2:10 AM   Specimen: Nasopharyngeal Swab; Nasopharyngeal(NP) swabs in vial transport medium  Result Value Ref Range Status   SARS Coronavirus 2 by RT PCR NEGATIVE NEGATIVE Final    Comment: (NOTE) SARS-CoV-2 target nucleic acids are NOT DETECTED.  The SARS-CoV-2 RNA is generally detectable in upper respiratory specimens during the acute phase of infection. The lowest concentration of SARS-CoV-2 viral copies this assay can detect is 138 copies/mL. A negative result does not preclude SARS-Cov-2 infection and should not be used as the sole basis for treatment or other patient management decisions. A negative result may occur with  improper specimen collection/handling, submission of specimen other than nasopharyngeal swab, presence of viral mutation(s) within the areas targeted by this assay, and inadequate number of viral copies(<138 copies/mL). A negative result must be combined with clinical observations, patient history, and epidemiological information. The  expected result is Negative.  Fact Sheet for Patients:  BloggerCourse.com  Fact Sheet for Healthcare Providers:  SeriousBroker.it  This test is no t yet approved or cleared by the [place] FDA and  has been authorized for detection and/or diagnosis of SARS-CoV-2 by FDA under an Emergency Use Authorization (EUA). This EUA will remain  in effect (meaning this test can be used) for the duration of the COVID-19 declaration under Section 564(b)(1) of the Act, 21 U.S.C.section 360bbb-3(b)(1), unless the authorization is terminated  or revoked sooner.       Influenza A by PCR NEGATIVE NEGATIVE Final   Influenza B by PCR NEGATIVE NEGATIVE Final    Comment: (NOTE) The Xpert Xpress SARS-CoV-2/FLU/RSV plus assay is intended as an aid in the diagnosis of influenza from Nasopharyngeal swab specimens and should not be used as a sole basis for treatment. Nasal washings and aspirates are unacceptable for Xpert Xpress SARS-CoV-2/FLU/RSV testing.  Fact Sheet for Patients: BloggerCourse.com  Fact Sheet for Healthcare Providers: SeriousBroker.it  This test is not yet approved or cleared by the [place] FDA  and has been authorized for detection and/or diagnosis of SARS-CoV-2 by FDA under an Emergency Use Authorization (EUA). This EUA will remain in effect (meaning this test can be used) for the duration of the COVID-19 declaration under Section 564(b)(1) of the Act, 21 U.S.C. section 360bbb-3(b)(1), unless the authorization is terminated or revoked.  Performed at [facility], [address], [place], [place] [address]   MRSA PCR Screening     Status: None   Collection Time: 03/29/20  5:38 AM   Specimen: Nasal Mucosa; Nasopharyngeal  Result Value Ref Range Status   MRSA by PCR NEGATIVE NEGATIVE Final    Comment:        The GeneXpert MRSA Assay (FDA approved for  NASAL specimens only), is one component of a comprehensive MRSA colonization surveillance program. It is not intended to diagnose MRSA infection nor to guide or monitor treatment for MRSA infections. Performed at [facility], [address] [place], [place] [address]   SARS CORONAVIRUS 2 (TAT 6-24 HRS) Nasopharyngeal Nasopharyngeal Swab     Status: None   Collection Time: 03/31/20  8:00 PM   Specimen: Nasopharyngeal Swab  Result Value Ref Range Status   SARS Coronavirus 2 NEGATIVE NEGATIVE Final    Comment: (NOTE) SARS-CoV-2 target nucleic acids are NOT DETECTED.  The SARS-CoV-2 RNA is generally detectable in upper and lower respiratory specimens during the acute phase of infection. Negative results do not preclude SARS-CoV-2 infection, do not rule out co-infections with other pathogens, and should not be used as the sole basis for treatment or other patient management decisions. Negative results must be combined with clinical observations, patient history, and epidemiological information. The expected result is Negative.  Fact Sheet for Patients: HairSlick.no  Fact Sheet for Healthcare Providers: [email or web address]  This test is not yet approved or cleared by the [place] FDA and  has been authorized for detection and/or diagnosis of SARS-CoV-2 by FDA under an Emergency Use Authorization (EUA). This EUA will remain  in effect (meaning this test can be used) for the duration of the COVID-19 declaration under Se ction 564(b)(1) of the Act, 21 U.S.C. section 360bbb-3(b)(1), unless the authorization is terminated or revoked sooner.  Performed at [facility], [address], [place], [place] [address]      Labs: Basic Metabolic Panel: Recent Labs  Lab 03/28/20 0359 03/29/20 0455 03/30/20 0438 03/31/20 0445 04/01/20 0317  NA 135 136 136 138 138  K 4.4 4.8 5.1 4.5 4.4  CL 106 105  105 111 108  CO2 20* 23 21* 20* 21*  GLUCOSE 114* 116* 114* 97 183*  BUN 25* 41* 43* 38* 37*  CREATININE 1.13* 1.27* 1.84* 1.46* 1.36*  CALCIUM 9.4 9.7 8.6* 8.3* 8.7*  MG 1.8 2.7* 2.5* 2.3 2.2  PHOS 3.1 2.5 4.4 2.5 2.0*   Liver Function Tests: Recent Labs  Lab 03/28/20 0359 03/29/20 0455 03/30/20 0438 03/31/20 0445 04/01/20 0317  AST 16 16 24 25  32  ALT 17 15 15  <5 9  ALKPHOS 64 58 45 50 50  BILITOT 0.8 0.6 0.2* 0.4 0.4  PROT 7.4 7.5 6.2* 5.9* 6.0*  ALBUMIN 3.5 3.6 3.0* 2.7* 2.7*   No results for input(s): LIPASE, AMYLASE in the last 168 hours. No results for input(s): AMMONIA in the last 168 hours. CBC: Recent Labs  Lab 03/25/20 1450 03/27/20 0033 03/28/20 0359 03/29/20 0455 03/30/20 0438 03/31/20 0445 04/01/20 0317  WBC 7.2   < > 9.3 9.0 12.1* 8.8  8.9  NEUTROABS 5.1  --  7.0 6.4  --  6.4 6.6  HGB 10.7*   < > 11.1* 11.3* 8.8* 7.8* 8.2*  HCT 32.5*   < > 33.4* 34.0* 28.2* 24.8* 25.4*  MCV 99.4   < > 97.7 97.4 102.5* 102.1* 101.6*  PLT 309   < > 239 211 163 173 180   < > = values in this interval not displayed.   Cardiac Enzymes: No results for input(s): CKTOTAL, CKMB, CKMBINDEX, TROPONINI in the last 168 hours. BNP: BNP (last 3 results) Recent Labs    12/27/19 2305  BNP 1,391.1*    ProBNP (last 3 results) No results for input(s): PROBNP in the last 8760 hours.  CBG: Recent Labs  Lab 03/25/20 1234  GLUCAP 95       Signed:  Meredeth Ide MD.  [facility] 04/01/2020, 11:44 AM

## 2020-04-01 NOTE — Consult Note (Signed)
   [facility] CM Inpatient Consult   04/01/2020  [name] 12/26/1940 [id number]  Patient screened due to extreme risk score for unplanned readmission. Chart reviewed to assess for potential [facility] Care Management community service needs. Per review, patient is being recommended for a skilled nursing facility level of care.    No [facility] Care Management follow up needs at this time. Of note, [facility] Care Management services does not replace or interfere with any services that are arranged by inpatient case management or social work.  Christophe Louis, MSN, RN [facility] Liaison Nurse Mobile Phone [phone]  Toll free office [phone]

## 2020-04-02 ENCOUNTER — Non-Acute Institutional Stay (SKILLED_NURSING_FACILITY): Payer: Medicare HMO | Admitting: Orthopedic Surgery

## 2020-04-02 DIAGNOSIS — N1832 Chronic kidney disease, stage 3b: Secondary | ICD-10-CM | POA: Diagnosis not present

## 2020-04-02 DIAGNOSIS — Z8673 Personal history of transient ischemic attack (TIA), and cerebral infarction without residual deficits: Secondary | ICD-10-CM

## 2020-04-02 DIAGNOSIS — Z8781 Personal history of (healed) traumatic fracture: Secondary | ICD-10-CM | POA: Diagnosis not present

## 2020-04-02 DIAGNOSIS — I5042 Chronic combined systolic (congestive) and diastolic (congestive) heart failure: Secondary | ICD-10-CM | POA: Diagnosis not present

## 2020-04-02 DIAGNOSIS — G40909 Epilepsy, unspecified, not intractable, without status epilepticus: Secondary | ICD-10-CM

## 2020-04-02 DIAGNOSIS — F0280 Dementia in other diseases classified elsewhere without behavioral disturbance: Secondary | ICD-10-CM

## 2020-04-02 DIAGNOSIS — D649 Anemia, unspecified: Secondary | ICD-10-CM | POA: Diagnosis not present

## 2020-04-02 DIAGNOSIS — G309 Alzheimer's disease, unspecified: Secondary | ICD-10-CM | POA: Diagnosis not present

## 2020-04-02 DIAGNOSIS — I10 Essential (primary) hypertension: Secondary | ICD-10-CM

## 2020-04-03 ENCOUNTER — Encounter: Payer: Self-pay | Admitting: Orthopedic Surgery

## 2020-04-03 ENCOUNTER — Encounter: Payer: Self-pay | Admitting: Internal Medicine

## 2020-04-03 ENCOUNTER — Non-Acute Institutional Stay (SKILLED_NURSING_FACILITY): Payer: Medicare HMO | Admitting: Internal Medicine

## 2020-04-03 DIAGNOSIS — D649 Anemia, unspecified: Secondary | ICD-10-CM | POA: Diagnosis not present

## 2020-04-03 DIAGNOSIS — Z8781 Personal history of (healed) traumatic fracture: Secondary | ICD-10-CM

## 2020-04-03 DIAGNOSIS — G309 Alzheimer's disease, unspecified: Secondary | ICD-10-CM | POA: Diagnosis not present

## 2020-04-03 DIAGNOSIS — I5042 Chronic combined systolic (congestive) and diastolic (congestive) heart failure: Secondary | ICD-10-CM

## 2020-04-03 DIAGNOSIS — I428 Other cardiomyopathies: Secondary | ICD-10-CM

## 2020-04-03 DIAGNOSIS — I10 Essential (primary) hypertension: Secondary | ICD-10-CM | POA: Diagnosis not present

## 2020-04-03 DIAGNOSIS — Z8673 Personal history of transient ischemic attack (TIA), and cerebral infarction without residual deficits: Secondary | ICD-10-CM | POA: Diagnosis not present

## 2020-04-03 DIAGNOSIS — F0280 Dementia in other diseases classified elsewhere without behavioral disturbance: Secondary | ICD-10-CM

## 2020-04-03 DIAGNOSIS — G40909 Epilepsy, unspecified, not intractable, without status epilepticus: Secondary | ICD-10-CM

## 2020-04-03 DIAGNOSIS — N1832 Chronic kidney disease, stage 3b: Secondary | ICD-10-CM | POA: Diagnosis not present

## 2020-04-03 NOTE — Progress Notes (Signed)
Provider:  Gwenith Spitziffany L. Renato Gailseed, D.O., C.M.D. [facility] Room Number: 107/P Place of Service:  SNF (31)  PCP: Elias Elseeade, Robert, MD Patient Care Team: Elias Elseeade, Robert, MD as PCP - General (Family Medicine) Corky CraftsVaranasi, Jayadeep S, MD as PCP - Cardiology (Cardiology)  Extended Emergency Contact Information Primary Emergency Contact: palmer, george Mobile Phone: [phone] Relation: Son Secondary Emergency Contact: Scotty CourtWright,Robert  [place] Home Phone: [phone] Mobile Phone: [phone] Relation: Son  Code Status: FULL Goals of Care: Advanced Directive information Advanced Directives 04/03/2020  Does Patient Have a Medical Advance Directive? No  Type of Advance Directive -  Does patient want to make changes to medical advance directive? -  Copy of Healthcare Power of Attorney in Chart? -  Would patient like information on creating a medical advance directive? No - Patient declined  Pre-existing out of facility DNR order (yellow form or pink MOST form) -   Chief Complaint  Patient presents with  . New Admit To SNF    [facility]     HPI: Patient is a 80 y.o. female seen today [facility] status post hospitalization from February 24 to March 2 at [facility] following trip to urgent care February 23 where family members were concerned she was going to have a seizure, then ED visit February 23 for speech disturbance, hyperkalemia and seizure disorder.  At that time she had a speech abnormality for 1 to 2 hours and gradual functional decline for the past 2 days.  She had an increased difficulty walking and was not as sharp.  She had waxing and waning functionality however her speech issues were not normally present and she was normally able to hold a conversation.  She did have a stroke in the fall 2021.  She often stayed with her sister in [place] but was currently living  with her son in [place].  She had one episode of vomiting a few days prior but then left the emergency department without being seen due to the wait time.  The CT of the brain that she had did reveal extensive brain matter disease advanced for her age.  Her past medical history includes congestive heart failure, chronic kidney disease, baseline ataxia, seizure disorder, hypertension and also Alzheimer's disease.  On 223 outpatient follow-up was recommended for possible EEG or MRI and seeing neurology.  Unfortunately on 224, she sustained a fall with left proximal hip fracture.  Underwent left total hip arthroplasty by orthopedics, Dr. Samson FredericBrian Swinteck, on February 27.  She was started on baby aspirin twice a day for DVT prophylaxis, Vicodin as needed for pain, and therapy evaluated her and recommended SNF.  She is to follow-up with orthopedics in 2 weeks.  EEG was performed and was unremarkable.  She did have intermittent twitching of muscles during her admission.  She was continued on her Vimpat and Keppra without any changes to her seizure medications.  For her chronic combined systolic and diastolic heart failure, her echocardiogram in September 2021 showed an EF of 30-35%.  She remained euvolemic.  She had acute on chronic kidney disease stage IIIb with creatinine elevated to 1.52 but this is improved with better p.o. intake and hydration to 1.35.  She had low phosphorus which was repleted.  Recheck of level is recommended for Monday 3/7.  For her hypertension she was continued on her home medications of amlodipine, carvedilol, and losartan.  For normocytic anemia she did have acute blood loss anemia on top of her baseline but hemoglobin was felt to be stable.  Iron level was 61 ferritin 661 folate 22.4 B12 601.  She is continued on her home Aricept for her dementia.  I had seen her up walking in the hall with PT.  When I saw her for the visit in the afternoon, she was tired.  She was eager to  go home and a bit depressed.  Her son and her best friend were present and had noted some edema in her feet, left greater than right.  Weight last evening is recorded at 80.3lbs and that's still down from 83 during her hospital stay.  She is not eating well at all.    Past Medical History:  Diagnosis Date  . Abdominal pain 02/24/2013  . Acute combined systolic and diastolic congestive heart failure (HCC) 02/24/2013  . Acute on chronic renal insufficiency 05/15/2015  . Acute renal failure (HCC) 12/28/2012  . Acute renal failure superimposed on stage 3 chronic kidney disease (HCC)   . Anemia 05/20/2015  . 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 Basal ganglia infarction (HCC) 01/08/2013  . CAP (community acquired pneumonia)   . Chest pain 05/14/2015  . Chronic combined systolic and diastolic CHF (congestive heart failure) (HCC) 03/06/2014  . Congestive heart failure (CHF) (HCC)   . Constipation 02/24/2013  . Cough   . Diastolic dysfunction-grade 3 05/20/2015  . Dyspnea 02/24/2013  . Encephalopathy 12/28/2012  . Epilepsy (HCC)   . Generalized seizure (HCC) 12/28/2012  . H/O: CVA (cerebrovascular accident) 01/04/2013  . Hyperammonemia (HCC) 05/17/2015  . Hyperglycemia 02/24/2013  . Hyperkalemia 02/11/2013  . Hypertension   . 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 Hyponatremia 02/11/2013  . Influenza A 02/11/2013  . Liver enzyme elevation 02/11/2013  . Metabolic acidosis 12/28/2012  . Metabolic encephalopathy 05/16/2015  . NICM (nonischemic cardiomyopathy) -EF 25% 05/20/2015  . Pleuritic chest pain 02/24/2013  . PNA (pneumonia) 05/20/2015  . Pneumobilia 02/12/2013  . Pneumonia   . Seizure (HCC) 12/28/2012  . Seizures (HCC)   . Smoker   . Stroke (HCC)   . Troponin level elevated 05/14/2015  . Volume overload 02/24/2013  . Weight gain 02/24/2013   Past Surgical History:  Procedure Laterality Date  . FEMUR IM NAIL Right 01/05/2020  . INTRAMEDULLARY (IM) NAIL  INTERTROCHANTERIC Right 01/05/2020   Procedure: INTRAMEDULLARY (IM) NAIL INTERTROCHANTRIC;  Surgeon: [name], MD;  Location: [facility] OR;  Service: Orthopedics;  Laterality: Right;  . NO PAST SURGERIES    . TOTAL HIP ARTHROPLASTY Left 03/29/2020   Procedure: TOTAL HIP ARTHROPLASTY ANTERIOR APPROACH;  Surgeon: Samson Frederic, MD;  Location: [facility] ORS;  Service: Orthopedics;  Laterality: Left;    Social History   Socioeconomic History  . Marital status: Single    Spouse name: Not on file  . Number of children: 3  . Years of education: 16  . Highest education level: Not on file  Occupational History    Comment: Retired  Tobacco Use  . Smoking status: Current Some Day Smoker    Packs/day: 0.25    Types: Cigarettes  . Smokeless tobacco: Never Used  . Tobacco comment: 1/2 caigarettes a day  Vaping Use  . Vaping Use: Never used  Substance and Sexual Activity  . Alcohol use: Yes    Alcohol/week: 0.0 standard drinks    Comment: occasional  . Drug use: No  . Sexual activity: Not  Currently  Other Topics Concern  . Not on file  Social History Narrative   Patient lives at home alone.    Retired.   Education one year of college.   Left handed.   Caffeine one cup of coffee daily and one cup of tea.   Social Determinants of Health   Financial Resource Strain: Not on file  Food Insecurity: Not on file  Transportation Needs: Not on file  Physical Activity: Not on file  Stress: Not on file  Social Connections: Not on file    reports that she has been smoking cigarettes. She has been smoking about 0.25 packs per day. She has never used smokeless tobacco. She reports current alcohol use. She reports that she does not use drugs.  Functional Status Survey:    Family History  Problem Relation Age of Onset  . Cancer Father   . Anemia Daughter     Health Maintenance  Topic Date Due  . FOOT EXAM  Never done  . OPHTHALMOLOGY EXAM  Never done  . TETANUS/TDAP  Never done  . DEXA SCAN   Never done  . INFLUENZA VACCINE  09/01/2019  . HEMOGLOBIN A1C  06/26/2020  . COVID-19 Vaccine  Completed  . Hepatitis C Screening  Completed  . PNA vac Low Risk Adult  Completed  . HPV VACCINES  Aged Out    Allergies  Allergen Reactions  . Budesonide-Formoterol Fumarate     Other reaction(s): Sore throat  . Lisinopril     Other reaction(s): nausea  . Mirtazapine     Other reaction(s): Severe Headaches  . Sodium Bicarbonate     Other reaction(s): Makes her vomit    Outpatient Encounter Medications as of 04/03/2020  Medication Sig  . allopurinol (ZYLOPRIM) 100 MG tablet Take 100 mg by mouth daily.  Marland Kitchen amLODipine (NORVASC) 10 MG tablet Take 10 mg by mouth daily.  Marland Kitchen aspirin (ASPIRIN CHILDRENS) 81 MG chewable tablet Chew 1 tablet (81 mg total) by mouth 2 (two) times daily with a meal.  . carvedilol (COREG) 12.5 MG tablet Take 12.5 mg by mouth 2 (two) times daily with a meal.  . docusate sodium (COLACE) 100 MG capsule Take 1 capsule (100 mg total) by mouth 2 (two) times daily.  Marland Kitchen donepezil (ARICEPT) 5 MG tablet Take 5 mg by mouth daily.  . Ensure (ENSURE) Take 237 mLs by mouth 3 (three) times daily between meals.  . FEROSUL 325 (65 Fe) MG tablet Take 325 mg by mouth every morning.  Marland Kitchen HYDROcodone-acetaminophen (NORCO/VICODIN) 5-325 MG tablet Take 1 tablet by mouth every 6 (six) hours as needed for moderate pain. For 7 days  . lacosamide (VIMPAT) 200 MG TABS tablet Take 1 tablet (200 mg total) by mouth 2 (two) times daily.  Marland Kitchen levETIRAcetam (KEPPRA) 1000 MG tablet Take 1 tablet (1,000 mg total) by mouth 2 (two) times daily.  Marland Kitchen losartan (COZAAR) 100 MG tablet Take 100 mg by mouth daily.  . Multiple Vitamin (MULTIVITAMIN WITH MINERALS) TABS tablet Take 1 tablet by mouth daily.  . polyethylene glycol (MIRALAX / GLYCOLAX) 17 g packet Take 17 g by mouth daily as needed for mild constipation.  . [DISCONTINUED] HYDROcodone-acetaminophen (NORCO/VICODIN) 5-325 MG tablet Take 1-2 tablets by mouth  every 4 (four) hours as needed for moderate pain (pain score 4-6).  . [DISCONTINUED] Nutritional Supplements (,FEEDING SUPPLEMENT, PROSOURCE PLUS) liquid Take 30 mLs by mouth 2 (two) times daily between meals.  . [DISCONTINUED] phosphorus (K PHOS NEUTRAL) 350-093-818 MG tablet Take  1 tablet (250 mg total) by mouth 2 (two) times daily for 2 days.   No facility-administered encounter medications on file as of 04/03/2020.    Review of Systems  Constitutional: Positive for malaise/fatigue and weight loss. Negative for chills and fever.  HENT: Positive for hearing loss. Negative for congestion and sore throat.   Eyes: Negative for blurred vision.  Respiratory: Negative for cough and shortness of breath.   Cardiovascular: Positive for leg swelling. Negative for chest pain and palpitations.  Gastrointestinal: Negative for abdominal pain, blood in stool, constipation and melena.       Poor appetite  Genitourinary: Negative for dysuria.  Musculoskeletal: Positive for falls and joint pain.       Pain when up with therapy  Skin: Negative for rash.  Neurological: Positive for weakness. Negative for dizziness and loss of consciousness.  Endo/Heme/Allergies: Bruises/bleeds easily.  Psychiatric/Behavioral: Positive for depression.    Vitals:   04/03/20 1611  BP: 103/64  Pulse: 94  Resp: 18  Temp: (!) 97.3 F (36.3 C)  SpO2: 98%  Weight: 80 lb 6.4 oz (36.5 kg)  Height: 5' (1.524 m)   Body mass index is 15.7 kg/m. Physical Exam Vitals reviewed.  Constitutional:      General: She is not in acute distress.    Appearance: She is ill-appearing. She is not toxic-appearing.     Comments: Frail, cachectic female  HENT:     Head: Normocephalic and atraumatic.     Right Ear: External ear normal.     Left Ear: External ear normal.     Ears:     Comments: HOH    Nose: Nose normal.     Mouth/Throat:     Pharynx: Oropharynx is clear.  Eyes:     Conjunctiva/sclera: Conjunctivae normal.      Pupils: Pupils are equal, round, and reactive to light.  Cardiovascular:     Rate and Rhythm: Normal rate and regular rhythm.     Pulses: Normal pulses.     Heart sounds: Normal heart sounds.  Pulmonary:     Effort: Pulmonary effort is normal.     Breath sounds: No wheezing, rhonchi or rales.  Abdominal:     General: Bowel sounds are normal. There is no distension.     Palpations: Abdomen is soft.     Tenderness: There is no abdominal tenderness. There is no guarding or rebound.  Musculoskeletal:        General: Normal range of motion.     Cervical back: Neck supple.     Right lower leg: Edema present.     Left lower leg: Edema present.     Comments: Slightly more swelling of left greater than right foot  Lymphadenopathy:     Cervical: No cervical adenopathy.  Skin:    General: Skin is warm and dry.     Capillary Refill: Capillary refill takes less than 2 seconds.     Comments: Incision site without significant erythema, warmth or drainage, minimal swelling  Neurological:     Mental Status: She is alert.     Motor: Weakness present.     Gait: Gait abnormal.     Comments: Using walker  Psychiatric:     Comments: Flat affect     Labs reviewed: Basic Metabolic Panel: Recent Labs    03/30/20 0438 03/31/20 0445 04/01/20 0317  NA 136 138 138  K 5.1 4.5 4.4  CL 105 111 108  CO2 21* 20* 21*  GLUCOSE 114* 97  183*  BUN 43* 38* 37*  CREATININE 1.84* 1.46* 1.36*  CALCIUM 8.6* 8.3* 8.7*  MG 2.5* 2.3 2.2  PHOS 4.4 2.5 2.0*   Liver Function Tests: Recent Labs    03/30/20 0438 03/31/20 0445 04/01/20 0317  AST 24 25 32  ALT 15 <5 9  ALKPHOS 45 50 50  BILITOT 0.2* 0.4 0.4  PROT 6.2* 5.9* 6.0*  ALBUMIN 3.0* 2.7* 2.7*   No results for input(s): LIPASE, AMYLASE in the last 8760 hours. Recent Labs    01/04/20 1929  AMMONIA 15   CBC: Recent Labs    03/29/20 0455 03/30/20 0438 03/31/20 0445 04/01/20 0317  WBC 9.0 12.1* 8.8 8.9  NEUTROABS 6.4  --  6.4 6.6  HGB  11.3* 8.8* 7.8* 8.2*  HCT 34.0* 28.2* 24.8* 25.4*  MCV 97.4 102.5* 102.1* 101.6*  PLT 211 163 173 180   Cardiac Enzymes: Recent Labs    12/27/19 1439  CKTOTAL 140   BNP: Invalid input(s): POCBNP Lab Results  Component Value Date   HGBA1C 5.4 12/28/2019   Lab Results  Component Value Date   TSH 1.765 05/16/2015   Lab Results  Component Value Date   VITAMINB12 601 03/28/2020   Lab Results  Component Value Date   FOLATE 22.4 03/28/2020   Lab Results  Component Value Date   IRON 61 03/28/2020   TIBC 244 (L) 03/28/2020   FERRITIN 661 (H) 03/28/2020    Imaging and Procedures obtained prior to SNF admission: EEG adult  Result Date: 03/31/2020 Charlsie Quest, MD     03/31/2020  3:49 PM Patient Name: [name] MRN: [id number] Epilepsy Attending: Charlsie Quest Referring Physician/Provider: Dr Haig Prophet Date: 03/31/2020 Duration: 22.29 mins Patient history: 80yo F with h/o epilepsy noted to have intermittent twitching and jerking. EEG to evaluate for seizure Level of alertness: Awake AEDs during EEG study: LEV, LCM Technical aspects: This EEG study was done with scalp electrodes positioned according to the 10-20 International system of electrode placement. Electrical activity was acquired at a sampling rate of 500Hz  and reviewed with a high frequency filter of 70Hz  and a low frequency filter of 1Hz . EEG data were recorded continuously and digitally stored. Description: The posterior dominant rhythm consists of 7.5 Hz activity of moderate voltage (25-35 uV) seen predominantly in posterior head regions, symmetric and reactive to eye opening and eye closing. EEG showed continuous generalized 3 to 6 Hz theta-delta slowing. Hyperventilation and photic stimulation were not performed.   Patient was noted to have right hand/thumb non rhythmic twitching. Concomitant eeg before, during and after the event didn't show any eeg change to suggest sezure. ABNORMALITY -Continuous  slow, generalized IMPRESSION: This study is suggestive of mild to moderate  diffuse encephalopathy, nonspecific etiology. No seizures or epileptiform discharges were seen throughout the recording. Patient was noted to have right hand/thumb non rhythmic twitching without concomitant eeg and was most likely NOT epileptic. If suspicion for interictal activity remains a concern, a prolonged study should be considered. Priyanka Annabelle Harman    Assessment/Plan 1. S/p left hip fracture -here for rehab -seems depressed and intake poor -cont PT, OT as tolerates to give her opportunity to improve -f/u with Dr. Linna Caprice at 2 wks -dvt prophylaxis is asa bid -has norco for severe pain--wean as able - and bowels moving with current regimen  2. Seizure disorder (HCC) -cont keppra, vimpat  3. Alzheimer's dementia without behavioral disturbance, unspecified timing of dementia onset (HCC) -has had some postop delirium -  cont to wean opioids and encourage normal circadian rhythms -goal to return home -on low dose aricept  4. History of CVA (cerebrovascular accident) -noted, may have vascular component to dementia, as well  5. Chronic kidney disease, stage 3b (HCC) -Avoid nephrotoxic agents like nsaids, dose adjust renally excreted meds, hydrate. -monitor carefully in view of #6, as well  6. Chronic combined systolic and diastolic congestive heart failure (HCC) -has edema in feet, but weight reportedly is actually down and intake is poor -monitor for signs of volume overload, but appears she's more apt to get dry with her intake as it is  7. Primary hypertension -bps running low, monitor for dizziness  8. Hypophosphatemia -f/u phos level  9. Normocytic anemia -on iron daily, f/u cbc for postop anemia, also on ensure bid with meals  10. NICM (nonischemic cardiomyopathy) -EF 25% -notable, has multiple chronic illnesses that are challenging to balance -would benefit from palliative care in view of  this and currently has full code status.   Family/ staff Communication: d/w son in room  Labs/tests ordered:  Cbc, bmp, phos  Khamora Karan L. Jennifer Holland, D.O. Geriatrics [facility] [address], [place] [address] Cell Phone (Mon-Fri 8am-5pm):  [phone] On Call:  [phone] & follow prompts after 5pm & weekends Office Phone:  [phone] Office Fax:  [phone]

## 2020-04-03 NOTE — Progress Notes (Signed)
Location:  [facility] Room Number: 107/P Place of Service:  SNF [phone]) Provider:  Hazle Nordmann, AGNP-C  [name], MD  Patient Care Team: [name], MD as PCP - General (Family Medicine) Corky Crafts, MD as PCP - Cardiology (Cardiology)  Extended Emergency Contact Information Primary Emergency Contact: palmer, george Mobile Phone: [phone] Relation: Son Secondary Emergency Contact: [address]ty [address] [place] Home Phone: [phone] Mobile Phone: [phone] Relation: Son  Goals of care: Advanced Directive information Advanced Directives 03/26/2020  Does Patient Have a Medical Advance Directive? No  Type of Advance Directive -  Does patient want to make changes to medical advance directive? -  Copy of Healthcare Power of Attorney in Chart? -  Would patient like information on creating a medical advance directive? No - Patient declined  Pre-existing out of facility DNR order (yellow form or pink MOST form) -     Chief Complaint  Patient presents with  . Hospitalization Follow-up    HPI:  Pt is a 80 y.o. female seen today for a hospital f/u s/p admission from [facility] 02/24- 03/02.   She currently resides at [facility], seen today at bedside. PMH includes: combined heart failure, nonischemic cardiomyopathy, hypertensive heart disease, history of cva, Alzheimer's without behavioral disturbance, seizures, chronic kidney disease stage 3b, anemia, and protein- calorie malnutrition.   Prior to event, she recently started living with son. 02/24 son found her on the bedside floor. She was taken to [facility] ED for evaluation. In the ED, she was afebrile, tachycardic with elevated blood pressure 170/100. EKG noted sinus tach and LBBB. WBC 11.2. Hgb 11.3. HCT 34.8. Na 133. K 4.7. Cl 100. CO2 21. Glucose 130. BUN 29. Creat 1.52. Calcium 9.8. AST 23. ALT19. INR 1.0. Urine unremarkable. Chest x-ray  noted cardiomegaly and aortic atherosclerosis, possible pulmonary venous hypertension. CT head no acute intracranial abnormality. Mild cerebral atrophy and microvascular disease changes of the supratentorial brain. CT spine noted mild cerebral atrophy and microvascular disease changes, no evidence of acute fracture or subluxations to spine. Moderate degenerative changes to C2-C3, C4-C5, C5-C6 noted. Left knee x-ray negative for fracture or abnormality. Left hip x-ray noted acute fracture to proximal left femur. Orthopedics consulted. 02/27, she underwent left total hip arthroplasty, anterior approach by Dr. Samson Frederic. She tolerated procedure well. Started on 81 mg aspirin twice daily for dvt prophylaxis. Given Vicodin for pain control. Due to her history of seizures, EEG was performed, results unremarkable, advised to continue Keppra. Creatine continued to trend down from admission, thought to be related to poor po intake at home. Hypophosphatemia level remained low. Supplementation outpatient advised. PT recommended snf at discharge for additional therapy services.   Today, she is sitting in her recliner during encounter. Alert and oriented x 2. Follows commands and can express needs. She is very pleasant. Ambulating about 10 ft with walker. Moderate assistance with ADLs. She denies any left hip pain, chest pain or sob. Does not recall if she has had a bowel movement.   Facility nurse does not report any concerns, vitals stable.   Past Medical History:  Diagnosis Date  . Abdominal pain 02/24/2013  . Acute combined systolic and diastolic congestive heart failure (HCC) 02/24/2013  . Acute on chronic renal insufficiency 05/15/2015  . Acute renal failure (HCC) 12/28/2012  . Acute renal failure superimposed on stage 3 chronic kidney disease (HCC)   . Anemia 05/20/2015  . Ataxia, late effect of cerebrovascular  disease 02/19/2013  . Basal ganglia infarction (HCC) 01/08/2013  . CAP (community acquired  pneumonia)   . Chest pain 05/14/2015  . Chronic combined systolic and diastolic CHF (congestive heart failure) (HCC) 03/06/2014  . Congestive heart failure (CHF) (HCC)   . Constipation 02/24/2013  . Cough   . Diastolic dysfunction-grade 3 05/20/2015  . Dyspnea 02/24/2013  . Encephalopathy 12/28/2012  . Epilepsy (HCC)   . Generalized seizure (HCC) 12/28/2012  . H/O: CVA (cerebrovascular accident) 01/04/2013  . Hyperammonemia (HCC) 05/17/2015  . Hyperglycemia 02/24/2013  . Hyperkalemia 02/11/2013  . Hypertension   . 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 Hyponatremia 02/11/2013  . Influenza A 02/11/2013  . Liver enzyme elevation 02/11/2013  . Metabolic acidosis 12/28/2012  . Metabolic encephalopathy 05/16/2015  . NICM (nonischemic cardiomyopathy) -EF 25% 05/20/2015  . Pleuritic chest pain 02/24/2013  . PNA (pneumonia) 05/20/2015  . Pneumobilia 02/12/2013  . Pneumonia   . Seizure (HCC) 12/28/2012  . Seizures (HCC)   . Smoker   . Stroke (HCC)   . Troponin level elevated 05/14/2015  . Volume overload 02/24/2013  . Weight gain 02/24/2013   Past Surgical History:  Procedure Laterality Date  . FEMUR IM NAIL Right 01/05/2020  . INTRAMEDULLARY (IM) NAIL INTERTROCHANTERIC Right 01/05/2020   Procedure: INTRAMEDULLARY (IM) NAIL INTERTROCHANTRIC;  Surgeon: [name], MD;  Location: [facility] OR;  Service: Orthopedics;  Laterality: Right;  . NO PAST SURGERIES    . TOTAL HIP ARTHROPLASTY Left 03/29/2020   Procedure: TOTAL HIP ARTHROPLASTY ANTERIOR APPROACH;  Surgeon: Samson Frederic, MD;  Location: [facility] ORS;  Service: Orthopedics;  Laterality: Left;    Allergies  Allergen Reactions  . Budesonide-Formoterol Fumarate     Other reaction(s): Sore throat  . Lisinopril     Other reaction(s): nausea  . Mirtazapine     Other reaction(s): Severe Headaches  . Sodium Bicarbonate     Other reaction(s): Makes her vomit    Outpatient Encounter Medications as of 04/02/2020  Medication Sig  . allopurinol  (ZYLOPRIM) 100 MG tablet Take 100 mg by mouth daily.  Marland Kitchen amLODipine (NORVASC) 10 MG tablet Take 10 mg by mouth daily.  Marland Kitchen aspirin (ASPIRIN CHILDRENS) 81 MG chewable tablet Chew 1 tablet (81 mg total) by mouth 2 (two) times daily with a meal.  . carvedilol (COREG) 12.5 MG tablet Take 12.5 mg by mouth 2 (two) times daily with a meal.  . docusate sodium (COLACE) 100 MG capsule Take 1 capsule (100 mg total) by mouth 2 (two) times daily. (Patient taking differently: Take 100 mg by mouth daily as needed for mild constipation.)  . donepezil (ARICEPT) 5 MG tablet Take 5 mg by mouth daily.  . Ensure (ENSURE) Take 237 mLs by mouth 3 (three) times daily between meals.  . FEROSUL 325 (65 Fe) MG tablet Take 325 mg by mouth every morning.  Marland Kitchen HYDROcodone-acetaminophen (NORCO/VICODIN) 5-325 MG tablet Take 1-2 tablets by mouth every 4 (four) hours as needed for moderate pain (pain score 4-6).  Marland Kitchen lacosamide (VIMPAT) 200 MG TABS tablet Take 1 tablet (200 mg total) by mouth 2 (two) times daily.  Marland Kitchen levETIRAcetam (KEPPRA) 1000 MG tablet Take 1 tablet (1,000 mg total) by mouth 2 (two) times daily.  Marland Kitchen losartan (COZAAR) 100 MG tablet Take 100 mg by mouth daily.  . Multiple Vitamin (MULTIVITAMIN WITH MINERALS) TABS tablet Take 1 tablet by mouth daily. (Patient not taking: No sig reported)  . Nutritional Supplements (,FEEDING SUPPLEMENT, PROSOURCE PLUS) liquid Take 30 mLs  by mouth 2 (two) times daily between meals. (Patient not taking: No sig reported)  . phosphorus (K PHOS NEUTRAL) 155-852-130 MG tablet Take 1 tablet (250 mg total) by mouth 2 (two) times daily for 2 days.  . polyethylene glycol (MIRALAX / GLYCOLAX) 17 g packet Take 17 g by mouth daily as needed for mild constipation. (Patient not taking: No sig reported)   No facility-administered encounter medications on file as of 04/02/2020.    Review of Systems  Unable to perform ROS: Dementia    There is no immunization history for the selected administration types  on file for this patient. Pertinent  Health Maintenance Due  Topic Date Due  . FOOT EXAM  Never done  . OPHTHALMOLOGY EXAM  Never done  . DEXA SCAN  Never done  . INFLUENZA VACCINE  09/01/2019  . HEMOGLOBIN A1C  06/26/2020  . PNA vac Low Risk Adult  Completed   Fall Risk  01/14/2016 06/24/2015  Falls in the past year? Yes No  Number falls in past yr: 1 -  Injury with Fall? No -  Follow up Falls prevention discussed -   Functional Status Survey:    Vitals:   04/03/20 1325  BP: 132/62  Pulse: 66  Resp: 20  Temp: 97.7 F (36.5 C)  Weight: 80 lb 6.4 oz (36.5 kg)   Body mass index is 15.7 kg/m. Physical Exam Vitals reviewed.  HENT:     Head: Normocephalic.     Right Ear: There is no impacted cerumen.     Left Ear: There is no impacted cerumen.     Nose: Nose normal.     Mouth/Throat:     Mouth: Mucous membranes are moist.     Comments: Top missing teeth, bottom present Eyes:     General:        Right eye: No discharge.        Left eye: No discharge.  Cardiovascular:     Rate and Rhythm: Normal rate and regular rhythm.     Pulses: Normal pulses.     Heart sounds: Normal heart sounds. No murmur heard.   Pulmonary:     Effort: Pulmonary effort is normal. No respiratory distress.     Breath sounds: Normal breath sounds. No wheezing.  Abdominal:     General: Abdomen is flat. Bowel sounds are normal. There is no distension.     Palpations: Abdomen is soft.     Tenderness: There is no abdominal tenderness.  Musculoskeletal:     Cervical back: Normal range of motion.     Right lower leg: No edema.     Left lower leg: No edema.     Comments: Left hip aquacell CDI. No drainage or sign of infection. Surrounding skin intact.   Lymphadenopathy:     Cervical: No cervical adenopathy.  Skin:    General: Skin is warm and dry.     Capillary Refill: Capillary refill takes less than 2 seconds.  Neurological:     General: No focal deficit present.     Mental Status: She is  alert. Mental status is at baseline.     Motor: Weakness present.     Gait: Gait abnormal.     Comments: walker  Psychiatric:        Mood and Affect: Mood normal.        Behavior: Behavior normal.        Cognition and Memory: Memory is impaired.     Labs reviewed: Recent Labs  03/30/20 0438 03/31/20 0445 04/01/20 0317  NA 136 138 138  K 5.1 4.5 4.4  CL 105 111 108  CO2 21* 20* 21*  GLUCOSE 114* 97 183*  BUN 43* 38* 37*  CREATININE 1.84* 1.46* 1.36*  CALCIUM 8.6* 8.3* 8.7*  MG 2.5* 2.3 2.2  PHOS 4.4 2.5 2.0*   Recent Labs    03/30/20 0438 03/31/20 0445 04/01/20 0317  AST 24 25 32  ALT 15 <5 9  ALKPHOS 45 50 50  BILITOT 0.2* 0.4 0.4  PROT 6.2* 5.9* 6.0*  ALBUMIN 3.0* 2.7* 2.7*   Recent Labs    03/29/20 0455 03/30/20 0438 03/31/20 0445 04/01/20 0317  WBC 9.0 12.1* 8.8 8.9  NEUTROABS 6.4  --  6.4 6.6  HGB 11.3* 8.8* 7.8* 8.2*  HCT 34.0* 28.2* 24.8* 25.4*  MCV 97.4 102.5* 102.1* 101.6*  PLT 211 163 173 180   Lab Results  Component Value Date   TSH 1.765 05/16/2015   Lab Results  Component Value Date   HGBA1C 5.4 12/28/2019   Lab Results  Component Value Date   CHOL 135 01/07/2020   HDL 41 01/07/2020   LDLCALC 45 01/07/2020   TRIG 247 (H) 01/07/2020   CHOLHDL 3.3 01/07/2020    Significant Diagnostic Results in last 30 days:  DG Chest 1 View  Result Date: 03/27/2020 CLINICAL DATA:  Larey Seat from the bed. EXAM: CHEST  1 VIEW COMPARISON:  01/04/2020 FINDINGS: Chronic cardiomegaly and aortic atherosclerosis. Possible pulmonary venous hypertension but no frank edema. No consolidation or collapse. No effusion. No acute fracture identified. Chronic/healing fracture of the right humeral head. IMPRESSION: Cardiomegaly and aortic atherosclerosis. Possible pulmonary venous hypertension. No frank edema. Electronically Signed   By: Paulina Fusi M.D.   On: 03/27/2020 00:00   CT HEAD WO CONTRAST  Result Date: 03/27/2020 CLINICAL DATA:  Status post fall. EXAM: CT  HEAD WITHOUT CONTRAST CT CERVICAL SPINE WITHOUT CONTRAST TECHNIQUE: Multidetector CT imaging of the head and cervical spine was performed following the standard protocol without intravenous contrast. Multiplanar CT image reconstructions of the cervical spine were also generated. COMPARISON:  None. FINDINGS: CT HEAD FINDINGS Brain: There is mild cerebral atrophy with widening of the extra-axial spaces and ventricular dilatation. There are areas of decreased attenuation within the white matter tracts of the supratentorial brain, consistent with microvascular disease changes. Vascular: No hyperdense vessel or unexpected calcification. Skull: Normal. Negative for fracture or focal lesion. Sinuses/Orbits: No acute finding. Other: None. CT CERVICAL SPINE FINDINGS Alignment: There is approximately 1 mm to 2 mm retrolisthesis of the C4 vertebral body on C5. Skull base and vertebrae: No acute fracture. No primary bone lesion or focal pathologic process. Soft tissues and spinal canal: No prevertebral fluid or swelling. No visible canal hematoma. Disc levels: Mild to moderate severity endplate sclerosis is seen at the levels of C2-C3, and C4-C5. Mild anterior osteophyte formation is present at the levels of C3-C4 and C5-C6. Moderate severity intervertebral disc space narrowing is seen at the levels of C2-C3, C4-C5 and C5-C6. Normal bilateral multilevel facet joints are noted. Upper chest: Negative. Other: N/A IMPRESSION: 1. Mild cerebral atrophy and microvascular disease changes of the supratentorial brain. 2. No evidence of an acute fracture or subluxation of the cervical spine. 3. Moderate severity degenerative changes of the cervical spine, as described above. Electronically Signed   By: Aram Candela M.D.   On: 03/27/2020 00:16   CT HEAD WO CONTRAST  Result Date: 03/25/2020 CLINICAL DATA:  Mental status change EXAM:  CT HEAD WITHOUT CONTRAST TECHNIQUE: Contiguous axial images were obtained from the base of the skull  through the vertex without intravenous contrast. COMPARISON:  03/22/2020 FINDINGS: Brain: There is no acute intracranial hemorrhage, mass effect, or edema. No new loss of gray-white differentiation. There is no extra-axial fluid collection. Chronic parasagittal left frontoparietal infarct. Confluent areas of hypoattenuation in the supratentorial white matter likely reflecting stable chronic microvascular ischemic changes. Ventricles and sulci are stable in size and configuration. Vascular: No hyperdense vessel.There is atherosclerotic calcification at the skull base. Skull: Calvarium is unremarkable. Sinuses/Orbits: No acute finding. Other: None. IMPRESSION: No acute intracranial abnormality. Stable chronic/nonemergent findings detailed above. Electronically Signed   By: Guadlupe Spanish M.D.   On: 03/25/2020 14:52   CT HEAD WO CONTRAST  Result Date: 03/22/2020 CLINICAL DATA:  Transient ischemic attack. EXAM: CT HEAD WITHOUT CONTRAST TECHNIQUE: Contiguous axial images were obtained from the base of the skull through the vertex without intravenous contrast. COMPARISON:  January 04, 2020 FINDINGS: Brain: There is mild to moderate severity cerebral atrophy with widening of the extra-axial spaces and ventricular dilatation. There are areas of decreased attenuation within the white matter tracts of the supratentorial brain, consistent with microvascular disease changes. Vascular: No hyperdense vessel or unexpected calcification. Skull: Normal. Negative for fracture or focal lesion. Sinuses/Orbits: No acute finding. Other: None. IMPRESSION: 1. Generalized cerebral atrophy. 2. No acute intracranial abnormality. Electronically Signed   By: Aram Candela M.D.   On: 03/22/2020 01:06   CT CERVICAL SPINE WO CONTRAST  Result Date: 03/27/2020 CLINICAL DATA:  Status post fall. EXAM: CT HEAD WITHOUT CONTRAST CT CERVICAL SPINE WITHOUT CONTRAST TECHNIQUE: Multidetector CT imaging of the head and cervical spine was performed  following the standard protocol without intravenous contrast. Multiplanar CT image reconstructions of the cervical spine were also generated. COMPARISON:  None. FINDINGS: CT HEAD FINDINGS Brain: There is mild cerebral atrophy with widening of the extra-axial spaces and ventricular dilatation. There are areas of decreased attenuation within the white matter tracts of the supratentorial brain, consistent with microvascular disease changes. Vascular: No hyperdense vessel or unexpected calcification. Skull: Normal. Negative for fracture or focal lesion. Sinuses/Orbits: No acute finding. Other: None. CT CERVICAL SPINE FINDINGS Alignment: There is approximately 1 mm to 2 mm retrolisthesis of the C4 vertebral body on C5. Skull base and vertebrae: No acute fracture. No primary bone lesion or focal pathologic process. Soft tissues and spinal canal: No prevertebral fluid or swelling. No visible canal hematoma. Disc levels: Mild to moderate severity endplate sclerosis is seen at the levels of C2-C3, and C4-C5. Mild anterior osteophyte formation is present at the levels of C3-C4 and C5-C6. Moderate severity intervertebral disc space narrowing is seen at the levels of C2-C3, C4-C5 and C5-C6. Normal bilateral multilevel facet joints are noted. Upper chest: Negative. Other: N/A IMPRESSION: 1. Mild cerebral atrophy and microvascular disease changes of the supratentorial brain. 2. No evidence of an acute fracture or subluxation of the cervical spine. 3. Moderate severity degenerative changes of the cervical spine, as described above. Electronically Signed   By: Aram Candela M.D.   On: 03/27/2020 00:16   Pelvis Portable  Result Date: 03/29/2020 CLINICAL DATA:  Post left hip arthroplasty. EXAM: PORTABLE PELVIS 1-2 VIEWS COMPARISON:  03/26/2020 FINDINGS: Interval placement of left hip arthroplasty intact and normally located. Skin staples over the lateral soft tissues of the left hip. Remainder the exam is unchanged.  IMPRESSION: Expected changes post left hip arthroplasty. Electronically Signed   By: Elberta Fortis  M.D.   On: 03/29/2020 16:07   DG Knee Complete 4 Views Left  Result Date: 03/27/2020 CLINICAL DATA:  Status post fall. EXAM: LEFT KNEE - COMPLETE 4+ VIEW COMPARISON:  None. FINDINGS: No evidence of fracture, dislocation, or joint effusion. No evidence of arthropathy or other focal bone abnormality. Soft tissues are unremarkable. IMPRESSION: Negative. Electronically Signed   By: Aram Candela M.D.   On: 03/27/2020 00:04   EEG adult  Result Date: 03/31/2020 Charlsie Quest, MD     03/31/2020  3:49 PM Patient Name: Elea Holtzclaw MRN: [id number] Epilepsy Attending: Charlsie Quest Referring Physician/Provider: Dr Haig Prophet Date: 03/31/2020 Duration: 22.29 mins Patient history: 80yo F with h/o epilepsy noted to have intermittent twitching and jerking. EEG to evaluate for seizure Level of alertness: Awake AEDs during EEG study: LEV, LCM Technical aspects: This EEG study was done with scalp electrodes positioned according to the 10-20 International system of electrode placement. Electrical activity was acquired at a sampling rate of 500Hz  and reviewed with a high frequency filter of 70Hz  and a low frequency filter of 1Hz . EEG data were recorded continuously and digitally stored. Description: The posterior dominant rhythm consists of 7.5 Hz activity of moderate voltage (25-35 uV) seen predominantly in posterior head regions, symmetric and reactive to eye opening and eye closing. EEG showed continuous generalized 3 to 6 Hz theta-delta slowing. Hyperventilation and photic stimulation were not performed.   Patient was noted to have right hand/thumb non rhythmic twitching. Concomitant eeg before, during and after the event didn't show any eeg change to suggest sezure. ABNORMALITY -Continuous slow, generalized IMPRESSION: This study is suggestive of mild to moderate  diffuse encephalopathy, nonspecific  etiology. No seizures or epileptiform discharges were seen throughout the recording. Patient was noted to have right hand/thumb non rhythmic twitching without concomitant eeg and was most likely NOT epileptic. If suspicion for interictal activity remains a concern, a prolonged study should be considered.   DG C-Arm 1-60 Min-No Report  Result Date: 03/29/2020 Fluoroscopy was utilized by the requesting physician.  No radiographic interpretation.   DG HIP OPERATIVE UNILAT W OR W/O PELVIS LEFT  Result Date: 03/29/2020 CLINICAL DATA:  Left hip fracture post arthroplasty. EXAM: OPERATIVE left HIP (WITH PELVIS IF PERFORMED) 2 VIEWS TECHNIQUE: Fluoroscopic spot image(s) were submitted for interpretation post-operatively. COMPARISON:  03/26/2020 FINDINGS: Examination demonstrates interval placement of left hip arthroplasty intact and normally located. Partially visualized right femoral hardware intact and unchanged. IMPRESSION: Expected changes post left hip arthroplasty. Electronically Signed   By: 03/31/2020 M.D.   On: 03/29/2020 14:35   DG Hip Unilat W or Wo Pelvis 2-3 Views Left  Result Date: 03/27/2020 CLINICAL DATA:  Status post fall. EXAM: DG HIP (WITH OR WITHOUT PELVIS) 2-3V LEFT COMPARISON:  None. FINDINGS: Acute fracture deformity is seen extending through the neck of the proximal left femur. Approximately 1 shaft with superior displacement of the distal fracture site is noted. A radiopaque intramedullary rod and compression screw device are seen within the proximal right femur. There is no evidence of dislocation. There is no evidence of arthropathy or other focal bone abnormality. IMPRESSION: Acute fracture of the proximal left femur. Electronically Signed   By: Elberta Fortis M.D.   On: 03/27/2020 00:04    Assessment/Plan 1. S/p left hip fracture - followed by Dr. 03/29/2020 - no sign of infection, pain controlled, unknown BM - cont PT/OT - cont WBAT with walker - cont  aspirin 81 mg  po bid for dvt prophylaxis  2. Seizure disorder (HCC) - EEG unremarkable  - no reported seizures - cont Keppra regimen  3. Alzheimer's dementia without behavioral disturbance, unspecified timing of dementia onset (HCC) - stable, no behavioral disturbance - cont donepezil  4. Chronic kidney disease, stage 3b (HCC) - continue to avoid nephrotoxic drugs like NSAIDS and dose adjust medications to be renally excreted - encourage hydration - bmp in 1 week  5. Chronic combined systolic and diastolic congestive heart failure (HCC) - EF reported 30-35% - no recent weight fluctuations or sob - cont coreg   6. Primary hypertension - bp at goal < 150/90 - cont amlodipine and losartan  7. Hypophosphatemia - cont K-phos po bid x 5 days - recheck bmp 03/07  8. Normocytic anemia - minor blood loss from surgery < 50cc - cbc/diff in 1 week  9. History of CVA (cerebrovascular accident) - cont aspirin     Family/ staff Communication: Plan discussed with patient and facility nurse  Labs/tests ordered: cbc/diff, bmp

## 2020-04-06 ENCOUNTER — Encounter: Payer: Self-pay | Admitting: Orthopedic Surgery

## 2020-04-06 DIAGNOSIS — I10 Essential (primary) hypertension: Secondary | ICD-10-CM | POA: Diagnosis not present

## 2020-04-15 DIAGNOSIS — S72032D Displaced midcervical fracture of left femur, subsequent encounter for closed fracture with routine healing: Secondary | ICD-10-CM | POA: Diagnosis not present

## 2020-04-15 DIAGNOSIS — S72031D Displaced midcervical fracture of right femur, subsequent encounter for closed fracture with routine healing: Secondary | ICD-10-CM | POA: Diagnosis not present

## 2020-04-17 ENCOUNTER — Other Ambulatory Visit: Payer: Self-pay | Admitting: Orthopedic Surgery

## 2020-04-17 ENCOUNTER — Encounter: Payer: Self-pay | Admitting: Orthopedic Surgery

## 2020-04-17 ENCOUNTER — Non-Acute Institutional Stay (SKILLED_NURSING_FACILITY): Payer: Medicare HMO | Admitting: Orthopedic Surgery

## 2020-04-17 DIAGNOSIS — F0280 Dementia in other diseases classified elsewhere without behavioral disturbance: Secondary | ICD-10-CM

## 2020-04-17 DIAGNOSIS — D649 Anemia, unspecified: Secondary | ICD-10-CM

## 2020-04-17 DIAGNOSIS — I5042 Chronic combined systolic (congestive) and diastolic (congestive) heart failure: Secondary | ICD-10-CM | POA: Diagnosis not present

## 2020-04-17 DIAGNOSIS — G309 Alzheimer's disease, unspecified: Secondary | ICD-10-CM

## 2020-04-17 DIAGNOSIS — I10 Essential (primary) hypertension: Secondary | ICD-10-CM

## 2020-04-17 DIAGNOSIS — Z8781 Personal history of (healed) traumatic fracture: Secondary | ICD-10-CM | POA: Diagnosis not present

## 2020-04-17 DIAGNOSIS — I428 Other cardiomyopathies: Secondary | ICD-10-CM

## 2020-04-17 DIAGNOSIS — G40909 Epilepsy, unspecified, not intractable, without status epilepticus: Secondary | ICD-10-CM

## 2020-04-17 DIAGNOSIS — Z8673 Personal history of transient ischemic attack (TIA), and cerebral infarction without residual deficits: Secondary | ICD-10-CM

## 2020-04-17 DIAGNOSIS — N1832 Chronic kidney disease, stage 3b: Secondary | ICD-10-CM | POA: Diagnosis not present

## 2020-04-17 DIAGNOSIS — M1A30X0 Chronic gout due to renal impairment, unspecified site, without tophus (tophi): Secondary | ICD-10-CM

## 2020-04-17 MED ORDER — FEROSUL 325 (65 FE) MG PO TABS: 325.0000 mg | ORAL_TABLET | Freq: Every morning | ORAL | 0 refills | Status: DC

## 2020-04-17 MED ORDER — CARVEDILOL 12.5 MG PO TABS: 12.5000 mg | ORAL_TABLET | Freq: Two times a day (BID) | ORAL | 0 refills | Status: AC

## 2020-04-17 MED ORDER — ASPIRIN 81 MG PO CHEW: 81.0000 mg | CHEWABLE_TABLET | Freq: Two times a day (BID) | ORAL | 0 refills | Status: AC

## 2020-04-17 MED ORDER — ALLOPURINOL 100 MG PO TABS: 100.0000 mg | ORAL_TABLET | Freq: Every day | ORAL | 0 refills | Status: AC

## 2020-04-17 MED ORDER — AMLODIPINE BESYLATE 10 MG PO TABS: 10.0000 mg | ORAL_TABLET | Freq: Every day | ORAL | 0 refills | Status: AC

## 2020-04-17 MED ORDER — DONEPEZIL HCL 5 MG PO TABS: 5.0000 mg | ORAL_TABLET | Freq: Every day | ORAL | 0 refills | Status: AC

## 2020-04-17 MED ORDER — LOSARTAN POTASSIUM 100 MG PO TABS: 100.0000 mg | ORAL_TABLET | Freq: Every day | ORAL | 0 refills | Status: AC

## 2020-04-17 MED ORDER — LACOSAMIDE 200 MG PO TABS: 200.0000 mg | ORAL_TABLET | Freq: Two times a day (BID) | ORAL | 0 refills | Status: AC

## 2020-04-17 MED ORDER — LEVETIRACETAM 1000 MG PO TABS: 1000.0000 mg | ORAL_TABLET | Freq: Two times a day (BID) | ORAL | 0 refills | Status: AC

## 2020-04-17 NOTE — Progress Notes (Signed)
Location:    [facility] Room Number: 107/-P Place of Service:  SNF (31)  Provider: Hazle NordmannAmy Roena Sassaman NP  PCP: Elias Elseeade, Robert, MD Patient Care Team: Elias Elseeade, Robert, MD as PCP - General (Family Medicine) Corky CraftsVaranasi, Jayadeep S, MD as PCP - Cardiology (Cardiology)  Extended Emergency Contact Information Primary Emergency Contact: palmer, george Mobile Phone: [phone] Relation: Son Secondary Emergency Contact: Scotty CourtWright,Robert  [place] Home Phone: [phone] Mobile Phone: [phone] Relation: Son  Code Status: Full Code Goals of care:  Advanced Directive information Advanced Directives 04/17/2020  Does Patient Have a Medical Advance Directive? No  Type of Advance Directive -  Does patient want to make changes to medical advance directive? -  Copy of Healthcare Power of Attorney in Chart? -  Would patient like information on creating a medical advance directive? No - Patient declined  Pre-existing out of facility DNR order (yellow form or pink MOST form) -     Allergies  Allergen Reactions  . Budesonide-Formoterol Fumarate     Other reaction(s): Sore throat  . Lisinopril     Other reaction(s): nausea  . Mirtazapine     Other reaction(s): Severe Headaches  . Sodium Bicarbonate     Other reaction(s): Makes her vomit    Chief Complaint  Patient presents with  . Discharge Note    Discharge Visit     HPI:  80 y.o. female seen today for discharge evaluation.   She currently resides at [facility], seen today at bedside. PMH includes: combined heart failure, nonischemic cardiomyopathy, hypertensive heart disease, history of cva, Alzheimer's without behavioral disturbance, seizures, chronic kidney disease stage 3b, anemia, and protein- calorie malnutrition.   Prior to her stay, [facility] for left femur fracture. 02/27 she underwent left total hip arthroplasty, anterior approach by  Dr. Samson FredericBrian Swinteck. She tolerated procedure well and was discharged to snf for additional PT/OT.   Today, she is alert and oriented x 2. Follows commands and can express needs. She knows she is going home soon. Hip pain minimal, she rates it 2/10. Ambulating about 125 ft with walker. Touching assistance with ADLs. Seen by ortho 03/16, staples removed. Advised to continue aspirin 81 mg po bid and f/u in 4 week. Bowel movement yesterday. Appetite fair. She denies chest pain and sob.   No recent falls or injuries.   Facility nurse does not report and concerns, vitals stable.   At this time, she plans to discharge home with son 04/18/2020. Son to assist with transportation and care. Home health PT/OT ordered. No DME needs. I have advised patient to f/u with PCP in 1-2 weeks    Past Medical History:  Diagnosis Date  . Abdominal pain 02/24/2013  . Acute combined systolic and diastolic congestive heart failure (HCC) 02/24/2013  . Acute on chronic renal insufficiency 05/15/2015  . Acute renal failure (HCC) 12/28/2012  . Acute renal failure superimposed on stage 3 chronic kidney disease (HCC)   . Anemia 05/20/2015  . 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 Basal ganglia infarction (HCC) 01/08/2013  . CAP (community acquired pneumonia)   . Chest pain 05/14/2015  . Chronic combined systolic and diastolic CHF (congestive heart failure) (HCC) 03/06/2014  . Congestive heart failure (CHF) (HCC)   . Constipation 02/24/2013  . Cough   . Diastolic dysfunction-grade 3 05/20/2015  . Dyspnea 02/24/2013  . Encephalopathy 12/28/2012  . Epilepsy (HCC)   . Generalized seizure (  HCC) 12/28/2012  . H/O: CVA (cerebrovascular accident) 01/04/2013  . Hyperammonemia (HCC) 05/17/2015  . Hyperglycemia 02/24/2013  . Hyperkalemia 02/11/2013  . Hypertension   . 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 Hyponatremia 02/11/2013  . Influenza A 02/11/2013  . Liver enzyme elevation 02/11/2013  .  Metabolic acidosis 12/28/2012  . Metabolic encephalopathy 05/16/2015  . NICM (nonischemic cardiomyopathy) -EF 25% 05/20/2015  . Pleuritic chest pain 02/24/2013  . PNA (pneumonia) 05/20/2015  . Pneumobilia 02/12/2013  . Pneumonia   . Seizure (HCC) 12/28/2012  . Seizures (HCC)   . Smoker   . Stroke (HCC)   . Troponin level elevated 05/14/2015  . Volume overload 02/24/2013  . Weight gain 02/24/2013    Past Surgical History:  Procedure Laterality Date  . FEMUR IM NAIL Right 01/05/2020  . INTRAMEDULLARY (IM) NAIL INTERTROCHANTERIC Right 01/05/2020   Procedure: INTRAMEDULLARY (IM) NAIL INTERTROCHANTRIC;  Surgeon: [name], MD;  Location: [facility] OR;  Service: Orthopedics;  Laterality: Right;  . NO PAST SURGERIES    . TOTAL HIP ARTHROPLASTY Left 03/29/2020   Procedure: TOTAL HIP ARTHROPLASTY ANTERIOR APPROACH;  Surgeon: Samson Frederic, MD;  Location: [facility] ORS;  Service: Orthopedics;  Laterality: Left;      reports that she has been smoking cigarettes. She has been smoking about 0.25 packs per day. She has never used smokeless tobacco. She reports current alcohol use. She reports that she does not use drugs. Social History   Socioeconomic History  . Marital status: Single    Spouse name: Not on file  . Number of children: 3  . Years of education: 89  . Highest education level: Not on file  Occupational History    Comment: Retired  Tobacco Use  . Smoking status: Current Some Day Smoker    Packs/day: 0.25    Types: Cigarettes  . Smokeless tobacco: Never Used  . Tobacco comment: 1/2 caigarettes a day  Vaping Use  . Vaping Use: Never used  Substance and Sexual Activity  . Alcohol use: Yes    Alcohol/week: 0.0 standard drinks    Comment: occasional  . Drug use: No  . Sexual activity: Not Currently  Other Topics Concern  . Not on file  Social History Narrative   Patient lives at home alone.    Retired.   Education one year of college.   Left handed.   Caffeine one cup of coffee daily  and one cup of tea.   Social Determinants of Health   Financial Resource Strain: Not on file  Food Insecurity: Not on file  Transportation Needs: Not on file  Physical Activity: Not on file  Stress: Not on file  Social Connections: Not on file  Intimate Partner Violence: Not on file   Functional Status Survey:    Allergies  Allergen Reactions  . Budesonide-Formoterol Fumarate     Other reaction(s): Sore throat  . Lisinopril     Other reaction(s): nausea  . Mirtazapine     Other reaction(s): Severe Headaches  . Sodium Bicarbonate     Other reaction(s): Makes her vomit    Pertinent  Health Maintenance Due  Topic Date Due  . FOOT EXAM  Never done  . OPHTHALMOLOGY EXAM  Never done  . DEXA SCAN  Never done  . INFLUENZA VACCINE  09/01/2019  . HEMOGLOBIN A1C  06/26/2020  . PNA vac Low Risk Adult  Completed    Medications: Allergies as of 04/17/2020      Reactions   Budesonide-formoterol Fumarate  Other reaction(s): Sore throat   Lisinopril    Other reaction(s): nausea   Mirtazapine    Other reaction(s): Severe Headaches   Sodium Bicarbonate    Other reaction(s): Makes her vomit      Medication List       Accurate as of April 17, 2020 10:23 AM. If you have any questions, ask your nurse or doctor.        STOP taking these medications   HYDROcodone-acetaminophen 5-325 MG tablet Commonly known as: NORCO/VICODIN Stopped by: Octavia Heir, NP     TAKE these medications   acetaminophen 325 MG tablet Commonly known as: TYLENOL Take 650 mg by mouth every 6 (six) hours as needed.   allopurinol 100 MG tablet Commonly known as: ZYLOPRIM Take 100 mg by mouth daily.   amLODipine 10 MG tablet Commonly known as: NORVASC Take 10 mg by mouth daily.   aspirin 81 MG chewable tablet Commonly known as: Aspirin Childrens Chew 1 tablet (81 mg total) by mouth 2 (two) times daily with a meal.   carvedilol 12.5 MG tablet Commonly known as: COREG Take 12.5 mg by mouth 2  (two) times daily with a meal.   docusate sodium 100 MG capsule Commonly known as: COLACE Take 1 capsule (100 mg total) by mouth 2 (two) times daily.   donepezil 5 MG tablet Commonly known as: ARICEPT Take 5 mg by mouth daily.   Ensure Take 237 mLs by mouth 3 (three) times daily between meals.   FeroSul 325 (65 FE) MG tablet Generic drug: ferrous sulfate Take 325 mg by mouth every morning.   lacosamide 200 MG Tabs tablet Commonly known as: Vimpat Take 1 tablet (200 mg total) by mouth 2 (two) times daily.   levETIRAcetam 1000 MG tablet Commonly known as: KEPPRA Take 1 tablet (1,000 mg total) by mouth 2 (two) times daily.   losartan 100 MG tablet Commonly known as: COZAAR Take 100 mg by mouth daily.   multivitamin with minerals Tabs tablet Take 1 tablet by mouth daily.   polyethylene glycol 17 g packet Commonly known as: MIRALAX / GLYCOLAX Take 17 g by mouth daily as needed for mild constipation.       Review of Systems  Constitutional: Negative for activity change, appetite change and fever.  HENT: Negative for congestion, hearing loss and voice change.   Eyes: Negative for visual disturbance.  Respiratory: Negative for cough, shortness of breath and wheezing.   Cardiovascular: Negative for chest pain and leg swelling.  Gastrointestinal: Negative for abdominal distention, abdominal pain, constipation, diarrhea and nausea.  Genitourinary: Negative for dysuria, frequency and hematuria.  Musculoskeletal: Positive for arthralgias and myalgias.       Left hip pain  Skin:       Surgical incison  Neurological: Positive for weakness. Negative for dizziness and headaches.  Hematological: Bruises/bleeds easily.  Psychiatric/Behavioral: Positive for confusion. Negative for dysphoric mood. The patient is not nervous/anxious.     Vitals:   04/17/20 1001  BP: (!) 146/84  Pulse: 80  Resp: 20  Temp: 99 F (37.2 C)  SpO2: 98%  Weight: 80 lb 6.4 oz (36.5 kg)  Height: 5'  (1.524 m)   Body mass index is 15.7 kg/m. Physical Exam Vitals reviewed.  Constitutional:      General: She is not in acute distress.    Comments: frail  HENT:     Head: Normocephalic.     Right Ear: There is no impacted cerumen.     Left  Ear: There is no impacted cerumen.     Nose: Nose normal.     Mouth/Throat:     Mouth: Mucous membranes are moist.  Eyes:     General:        Right eye: No discharge.        Left eye: No discharge.  Cardiovascular:     Rate and Rhythm: Normal rate and regular rhythm.     Pulses: Normal pulses.     Heart sounds: Normal heart sounds. No murmur heard.   Pulmonary:     Effort: Pulmonary effort is normal. No respiratory distress.     Breath sounds: Normal breath sounds. No wheezing.  Abdominal:     General: Bowel sounds are normal. There is no distension.     Palpations: Abdomen is soft.     Tenderness: There is no abdominal tenderness.  Musculoskeletal:     Cervical back: Normal range of motion.     Right lower leg: No edema.     Left lower leg: No edema.     Comments: Left hip surgical incision CDI, closed, no staples present. Surrounding tissue intact.   Lymphadenopathy:     Cervical: No cervical adenopathy.  Skin:    General: Skin is warm and dry.     Capillary Refill: Capillary refill takes less than 2 seconds.  Neurological:     General: No focal deficit present.     Mental Status: She is alert. Mental status is at baseline.     Motor: Weakness present.     Gait: Gait abnormal.     Comments: walker  Psychiatric:        Mood and Affect: Mood normal. Affect is flat.        Behavior: Behavior normal.        Cognition and Memory: Memory is impaired.     Labs reviewed: Basic Metabolic Panel: Recent Labs    03/30/20 0438 03/31/20 0445 04/01/20 0317  NA 136 138 138  K 5.1 4.5 4.4  CL 105 111 108  CO2 21* 20* 21*  GLUCOSE 114* 97 183*  BUN 43* 38* 37*  CREATININE 1.84* 1.46* 1.36*  CALCIUM 8.6* 8.3* 8.7*  MG 2.5* 2.3  2.2  PHOS 4.4 2.5 2.0*   Liver Function Tests: Recent Labs    03/30/20 0438 03/31/20 0445 04/01/20 0317  AST 24 25 32  ALT 15 <5 9  ALKPHOS 45 50 50  BILITOT 0.2* 0.4 0.4  PROT 6.2* 5.9* 6.0*  ALBUMIN 3.0* 2.7* 2.7*   No results for input(s): LIPASE, AMYLASE in the last 8760 hours. Recent Labs    01/04/20 1929  AMMONIA 15   CBC: Recent Labs    03/29/20 0455 03/30/20 0438 03/31/20 0445 04/01/20 0317  WBC 9.0 12.1* 8.8 8.9  NEUTROABS 6.4  --  6.4 6.6  HGB 11.3* 8.8* 7.8* 8.2*  HCT 34.0* 28.2* 24.8* 25.4*  MCV 97.4 102.5* 102.1* 101.6*  PLT 211 163 173 180   Cardiac Enzymes: Recent Labs    12/27/19 1439  CKTOTAL 140   BNP: Invalid input(s): POCBNP CBG: Recent Labs    01/02/20 1601 01/04/20 1750 03/25/20 1234  GLUCAP 99 122* 95    Procedures and Imaging Studies During Stay: DG Chest 1 View  Result Date: 03/27/2020 CLINICAL DATA:  Larey Seat from the bed. EXAM: CHEST  1 VIEW COMPARISON:  01/04/2020 FINDINGS: Chronic cardiomegaly and aortic atherosclerosis. Possible pulmonary venous hypertension but no frank edema. No consolidation or collapse. No effusion. No acute  fracture identified. Chronic/healing fracture of the right humeral head. IMPRESSION: Cardiomegaly and aortic atherosclerosis. Possible pulmonary venous hypertension. No frank edema. Electronically Signed   By: Paulina Fusi M.D.   On: 03/27/2020 00:00   CT HEAD WO CONTRAST  Result Date: 03/27/2020 CLINICAL DATA:  Status post fall. EXAM: CT HEAD WITHOUT CONTRAST CT CERVICAL SPINE WITHOUT CONTRAST TECHNIQUE: Multidetector CT imaging of the head and cervical spine was performed following the standard protocol without intravenous contrast. Multiplanar CT image reconstructions of the cervical spine were also generated. COMPARISON:  None. FINDINGS: CT HEAD FINDINGS Brain: There is mild cerebral atrophy with widening of the extra-axial spaces and ventricular dilatation. There are areas of decreased attenuation  within the white matter tracts of the supratentorial brain, consistent with microvascular disease changes. Vascular: No hyperdense vessel or unexpected calcification. Skull: Normal. Negative for fracture or focal lesion. Sinuses/Orbits: No acute finding. Other: None. CT CERVICAL SPINE FINDINGS Alignment: There is approximately 1 mm to 2 mm retrolisthesis of the C4 vertebral body on C5. Skull base and vertebrae: No acute fracture. No primary bone lesion or focal pathologic process. Soft tissues and spinal canal: No prevertebral fluid or swelling. No visible canal hematoma. Disc levels: Mild to moderate severity endplate sclerosis is seen at the levels of C2-C3, and C4-C5. Mild anterior osteophyte formation is present at the levels of C3-C4 and C5-C6. Moderate severity intervertebral disc space narrowing is seen at the levels of C2-C3, C4-C5 and C5-C6. Normal bilateral multilevel facet joints are noted. Upper chest: Negative. Other: N/A IMPRESSION: 1. Mild cerebral atrophy and microvascular disease changes of the supratentorial brain. 2. No evidence of an acute fracture or subluxation of the cervical spine. 3. Moderate severity degenerative changes of the cervical spine, as described above. Electronically Signed   By: Aram Candela M.D.   On: 03/27/2020 00:16   CT HEAD WO CONTRAST  Result Date: 03/25/2020 CLINICAL DATA:  Mental status change EXAM: CT HEAD WITHOUT CONTRAST TECHNIQUE: Contiguous axial images were obtained from the base of the skull through the vertex without intravenous contrast. COMPARISON:  03/22/2020 FINDINGS: Brain: There is no acute intracranial hemorrhage, mass effect, or edema. No new loss of gray-white differentiation. There is no extra-axial fluid collection. Chronic parasagittal left frontoparietal infarct. Confluent areas of hypoattenuation in the supratentorial white matter likely reflecting stable chronic microvascular ischemic changes. Ventricles and sulci are stable in size and  configuration. Vascular: No hyperdense vessel.There is atherosclerotic calcification at the skull base. Skull: Calvarium is unremarkable. Sinuses/Orbits: No acute finding. Other: None. IMPRESSION: No acute intracranial abnormality. Stable chronic/nonemergent findings detailed above. Electronically Signed   By: Guadlupe Spanish M.D.   On: 03/25/2020 14:52   CT HEAD WO CONTRAST  Result Date: 03/22/2020 CLINICAL DATA:  Transient ischemic attack. EXAM: CT HEAD WITHOUT CONTRAST TECHNIQUE: Contiguous axial images were obtained from the base of the skull through the vertex without intravenous contrast. COMPARISON:  January 04, 2020 FINDINGS: Brain: There is mild to moderate severity cerebral atrophy with widening of the extra-axial spaces and ventricular dilatation. There are areas of decreased attenuation within the white matter tracts of the supratentorial brain, consistent with microvascular disease changes. Vascular: No hyperdense vessel or unexpected calcification. Skull: Normal. Negative for fracture or focal lesion. Sinuses/Orbits: No acute finding. Other: None. IMPRESSION: 1. Generalized cerebral atrophy. 2. No acute intracranial abnormality. Electronically Signed   By: Aram Candela M.D.   On: 03/22/2020 01:06   CT CERVICAL SPINE WO CONTRAST  Result Date: 03/27/2020 CLINICAL DATA:  Status  post fall. EXAM: CT HEAD WITHOUT CONTRAST CT CERVICAL SPINE WITHOUT CONTRAST TECHNIQUE: Multidetector CT imaging of the head and cervical spine was performed following the standard protocol without intravenous contrast. Multiplanar CT image reconstructions of the cervical spine were also generated. COMPARISON:  None. FINDINGS: CT HEAD FINDINGS Brain: There is mild cerebral atrophy with widening of the extra-axial spaces and ventricular dilatation. There are areas of decreased attenuation within the white matter tracts of the supratentorial brain, consistent with microvascular disease changes. Vascular: No hyperdense  vessel or unexpected calcification. Skull: Normal. Negative for fracture or focal lesion. Sinuses/Orbits: No acute finding. Other: None. CT CERVICAL SPINE FINDINGS Alignment: There is approximately 1 mm to 2 mm retrolisthesis of the C4 vertebral body on C5. Skull base and vertebrae: No acute fracture. No primary bone lesion or focal pathologic process. Soft tissues and spinal canal: No prevertebral fluid or swelling. No visible canal hematoma. Disc levels: Mild to moderate severity endplate sclerosis is seen at the levels of C2-C3, and C4-C5. Mild anterior osteophyte formation is present at the levels of C3-C4 and C5-C6. Moderate severity intervertebral disc space narrowing is seen at the levels of C2-C3, C4-C5 and C5-C6. Normal bilateral multilevel facet joints are noted. Upper chest: Negative. Other: N/A IMPRESSION: 1. Mild cerebral atrophy and microvascular disease changes of the supratentorial brain. 2. No evidence of an acute fracture or subluxation of the cervical spine. 3. Moderate severity degenerative changes of the cervical spine, as described above. Electronically Signed   By: Aram Candela M.D.   On: 03/27/2020 00:16   Pelvis Portable  Result Date: 03/29/2020 CLINICAL DATA:  Post left hip arthroplasty. EXAM: PORTABLE PELVIS 1-2 VIEWS COMPARISON:  03/26/2020 FINDINGS: Interval placement of left hip arthroplasty intact and normally located. Skin staples over the lateral soft tissues of the left hip. Remainder the exam is unchanged. IMPRESSION: Expected changes post left hip arthroplasty. Electronically Signed   By: Elberta Fortis M.D.   On: 03/29/2020 16:07   DG Knee Complete 4 Views Left  Result Date: 03/27/2020 CLINICAL DATA:  Status post fall. EXAM: LEFT KNEE - COMPLETE 4+ VIEW COMPARISON:  None. FINDINGS: No evidence of fracture, dislocation, or joint effusion. No evidence of arthropathy or other focal bone abnormality. Soft tissues are unremarkable. IMPRESSION: Negative. Electronically  Signed   By: Aram Candela M.D.   On: 03/27/2020 00:04   EEG adult  Result Date: 03/31/2020 Charlsie Quest, MD     03/31/2020  3:49 PM Patient Name: [name] MRN: [id number] Epilepsy Attending: Charlsie Quest Referring Physician/Provider: Dr Haig Prophet Date: 03/31/2020 Duration: 22.29 mins Patient history: 80yo F with h/o epilepsy noted to have intermittent twitching and jerking. EEG to evaluate for seizure Level of alertness: Awake AEDs during EEG study: LEV, LCM Technical aspects: This EEG study was done with scalp electrodes positioned according to the 10-20 International system of electrode placement. Electrical activity was acquired at a sampling rate of 500Hz  and reviewed with a high frequency filter of 70Hz  and a low frequency filter of 1Hz . EEG data were recorded continuously and digitally stored. Description: The posterior dominant rhythm consists of 7.5 Hz activity of moderate voltage (25-35 uV) seen predominantly in posterior head regions, symmetric and reactive to eye opening and eye closing. EEG showed continuous generalized 3 to 6 Hz theta-delta slowing. Hyperventilation and photic stimulation were not performed.   Patient was noted to have right hand/thumb non rhythmic twitching. Concomitant eeg before, during and after the event didn't show any  eeg change to suggest sezure. ABNORMALITY -Continuous slow, generalized IMPRESSION: This study is suggestive of mild to moderate  diffuse encephalopathy, nonspecific etiology. No seizures or epileptiform discharges were seen throughout the recording. Patient was noted to have right hand/thumb non rhythmic twitching without concomitant eeg and was most likely NOT epileptic. If suspicion for interictal activity remains a concern, a prolonged study should be considered. Charlsie Quest   DG C-Arm 1-60 Min-No Report  Result Date: 03/29/2020 Fluoroscopy was utilized by the requesting physician.  No radiographic interpretation.    DG HIP OPERATIVE UNILAT W OR W/O PELVIS LEFT  Result Date: 03/29/2020 CLINICAL DATA:  Left hip fracture post arthroplasty. EXAM: OPERATIVE left HIP (WITH PELVIS IF PERFORMED) 2 VIEWS TECHNIQUE: Fluoroscopic spot image(s) were submitted for interpretation post-operatively. COMPARISON:  03/26/2020 FINDINGS: Examination demonstrates interval placement of left hip arthroplasty intact and normally located. Partially visualized right femoral hardware intact and unchanged. IMPRESSION: Expected changes post left hip arthroplasty. Electronically Signed   By: Elberta Fortis M.D.   On: 03/29/2020 14:35   DG Hip Unilat W or Wo Pelvis 2-3 Views Left  Result Date: 03/27/2020 CLINICAL DATA:  Status post fall. EXAM: DG HIP (WITH OR WITHOUT PELVIS) 2-3V LEFT COMPARISON:  None. FINDINGS: Acute fracture deformity is seen extending through the neck of the proximal left femur. Approximately 1 shaft with superior displacement of the distal fracture site is noted. A radiopaque intramedullary rod and compression screw device are seen within the proximal right femur. There is no evidence of dislocation. There is no evidence of arthropathy or other focal bone abnormality. IMPRESSION: Acute fracture of the proximal left femur. Electronically Signed   By: Aram Candela M.D.   On: 03/27/2020 00:04    Assessment/Plan:   1. S/p left hip fracture - followed by Dr. Linna Caprice 03/16 - incision no sign of infection, ambulating 125 ft with walker, pain minimal, normal bowel movements - cont aspirin 81 mg po bid for dvt prophylaxis - cont tylenol prn for pain - home health PT/OT  2. Seizure disorder (HCC) - stable with keppra, vimpat  3. Alzheimer's dementia without behavioral disturbance, unspecified timing of dementia onset (HCC) - postop delirium noted - no behavioral outbursts during stay, flat affect - cont aricept  4. 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 Chronic kidney disease, stage 3b (HCC) - continue to avoid nephrotoxic drugs like NSAIDS and dose adjust medications to be renally excreted - encourage hydration  6. Chronic combined systolic and diastolic congestive heart failure (HCC) - no recent weight fluctuations, sob or ankle edema - cont to monitor for fluid overload - cont low sodium diet  7. Primary hypertension - at goal, cont coreg, amlodipine and losartan  8. Hypophosphatemia - phos 3.8 on 03/07  9. Normocytic anemia - started on iron for post op anemia - hgb 8.9 on 03/07 - cbc/diff in 1-2 weeks with PCP  10. NICM (nonischemic cardiomyopathy) -EF 25% - recommend future palliative care consult due to multiple comorbidities    Patient is being discharged with the following home health services:  PT/OT  Patient is being discharged with the following durable medical equipment: None   Patient has been advised to f/u with their PCP in 1-2 weeks to bring them up to date on their rehab stay.  Social services at facility was responsible for arranging this appointment.  Pt was provided with a 30 day supply of prescriptions for medications and refills  must be obtained from their PCP.  For controlled substances, a more limited supply may be provided adequate until PCP appointment only.  Future labs/tests needed:

## 2020-04-21 DIAGNOSIS — I69351 Hemiplegia and hemiparesis following cerebral infarction affecting right dominant side: Secondary | ICD-10-CM | POA: Diagnosis not present

## 2020-04-21 DIAGNOSIS — Z9181 History of falling: Secondary | ICD-10-CM | POA: Diagnosis not present

## 2020-04-21 DIAGNOSIS — S72002D Fracture of unspecified part of neck of left femur, subsequent encounter for closed fracture with routine healing: Secondary | ICD-10-CM | POA: Diagnosis not present

## 2020-04-21 DIAGNOSIS — M6281 Muscle weakness (generalized): Secondary | ICD-10-CM | POA: Diagnosis not present

## 2020-04-21 DIAGNOSIS — Z96642 Presence of left artificial hip joint: Secondary | ICD-10-CM | POA: Diagnosis not present

## 2020-04-28 DIAGNOSIS — I10 Essential (primary) hypertension: Secondary | ICD-10-CM | POA: Diagnosis not present

## 2020-04-28 DIAGNOSIS — G40909 Epilepsy, unspecified, not intractable, without status epilepticus: Secondary | ICD-10-CM | POA: Diagnosis not present

## 2020-04-28 DIAGNOSIS — E46 Unspecified protein-calorie malnutrition: Secondary | ICD-10-CM | POA: Diagnosis not present

## 2020-04-28 DIAGNOSIS — Z96642 Presence of left artificial hip joint: Secondary | ICD-10-CM | POA: Diagnosis not present

## 2020-04-28 DIAGNOSIS — F0390 Unspecified dementia without behavioral disturbance: Secondary | ICD-10-CM | POA: Diagnosis not present

## 2020-04-28 DIAGNOSIS — R296 Repeated falls: Secondary | ICD-10-CM | POA: Diagnosis not present

## 2020-04-28 DIAGNOSIS — Z9289 Personal history of other medical treatment: Secondary | ICD-10-CM | POA: Diagnosis not present

## 2020-04-30 DIAGNOSIS — E46 Unspecified protein-calorie malnutrition: Secondary | ICD-10-CM | POA: Diagnosis not present

## 2020-05-04 DIAGNOSIS — N1832 Chronic kidney disease, stage 3b: Secondary | ICD-10-CM | POA: Diagnosis not present

## 2020-05-04 DIAGNOSIS — G309 Alzheimer's disease, unspecified: Secondary | ICD-10-CM | POA: Diagnosis not present

## 2020-05-04 DIAGNOSIS — I130 Hypertensive heart and chronic kidney disease with heart failure and stage 1 through stage 4 chronic kidney disease, or unspecified chronic kidney disease: Secondary | ICD-10-CM | POA: Diagnosis not present

## 2020-05-04 DIAGNOSIS — G40909 Epilepsy, unspecified, not intractable, without status epilepticus: Secondary | ICD-10-CM | POA: Diagnosis not present

## 2020-05-04 DIAGNOSIS — G8929 Other chronic pain: Secondary | ICD-10-CM | POA: Diagnosis not present

## 2020-05-04 DIAGNOSIS — I428 Other cardiomyopathies: Secondary | ICD-10-CM | POA: Diagnosis not present

## 2020-05-04 DIAGNOSIS — I5042 Chronic combined systolic (congestive) and diastolic (congestive) heart failure: Secondary | ICD-10-CM | POA: Diagnosis not present

## 2020-05-04 DIAGNOSIS — S7292XD Unspecified fracture of left femur, subsequent encounter for closed fracture with routine healing: Secondary | ICD-10-CM | POA: Diagnosis not present

## 2020-05-04 DIAGNOSIS — F0280 Dementia in other diseases classified elsewhere without behavioral disturbance: Secondary | ICD-10-CM | POA: Diagnosis not present

## 2020-05-06 DIAGNOSIS — I428 Other cardiomyopathies: Secondary | ICD-10-CM | POA: Diagnosis not present

## 2020-05-06 DIAGNOSIS — G309 Alzheimer's disease, unspecified: Secondary | ICD-10-CM | POA: Diagnosis not present

## 2020-05-06 DIAGNOSIS — N1832 Chronic kidney disease, stage 3b: Secondary | ICD-10-CM | POA: Diagnosis not present

## 2020-05-06 DIAGNOSIS — G40909 Epilepsy, unspecified, not intractable, without status epilepticus: Secondary | ICD-10-CM | POA: Diagnosis not present

## 2020-05-06 DIAGNOSIS — I5042 Chronic combined systolic (congestive) and diastolic (congestive) heart failure: Secondary | ICD-10-CM | POA: Diagnosis not present

## 2020-05-06 DIAGNOSIS — G8929 Other chronic pain: Secondary | ICD-10-CM | POA: Diagnosis not present

## 2020-05-06 DIAGNOSIS — S7292XD Unspecified fracture of left femur, subsequent encounter for closed fracture with routine healing: Secondary | ICD-10-CM | POA: Diagnosis not present

## 2020-05-06 DIAGNOSIS — F0280 Dementia in other diseases classified elsewhere without behavioral disturbance: Secondary | ICD-10-CM | POA: Diagnosis not present

## 2020-05-06 DIAGNOSIS — I130 Hypertensive heart and chronic kidney disease with heart failure and stage 1 through stage 4 chronic kidney disease, or unspecified chronic kidney disease: Secondary | ICD-10-CM | POA: Diagnosis not present

## 2020-05-08 ENCOUNTER — Other Ambulatory Visit: Payer: Self-pay

## 2020-05-08 ENCOUNTER — Emergency Department (HOSPITAL_COMMUNITY)
Admission: EM | Admit: 2020-05-08 | Discharge: 2020-05-12 | Disposition: A | Discharge status: Home / Self Care | Payer: Medicare HMO | Attending: Emergency Medicine | Admitting: Emergency Medicine

## 2020-05-08 ENCOUNTER — Encounter (HOSPITAL_COMMUNITY): Payer: Self-pay | Admitting: Emergency Medicine

## 2020-05-08 DIAGNOSIS — I5041 Acute combined systolic (congestive) and diastolic (congestive) heart failure: Secondary | ICD-10-CM | POA: Diagnosis not present

## 2020-05-08 DIAGNOSIS — R410 Disorientation, unspecified: Secondary | ICD-10-CM | POA: Diagnosis not present

## 2020-05-08 DIAGNOSIS — I25119 Atherosclerotic heart disease of native coronary artery with unspecified angina pectoris: Secondary | ICD-10-CM | POA: Diagnosis not present

## 2020-05-08 DIAGNOSIS — Z20822 Contact with and (suspected) exposure to covid-19: Secondary | ICD-10-CM | POA: Diagnosis not present

## 2020-05-08 DIAGNOSIS — I130 Hypertensive heart and chronic kidney disease with heart failure and stage 1 through stage 4 chronic kidney disease, or unspecified chronic kidney disease: Secondary | ICD-10-CM | POA: Diagnosis not present

## 2020-05-08 DIAGNOSIS — D539 Nutritional anemia, unspecified: Secondary | ICD-10-CM | POA: Diagnosis not present

## 2020-05-08 DIAGNOSIS — Z79899 Other long term (current) drug therapy: Secondary | ICD-10-CM | POA: Diagnosis not present

## 2020-05-08 DIAGNOSIS — F17210 Nicotine dependence, cigarettes, uncomplicated: Secondary | ICD-10-CM | POA: Diagnosis not present

## 2020-05-08 DIAGNOSIS — I10 Essential (primary) hypertension: Secondary | ICD-10-CM | POA: Diagnosis not present

## 2020-05-08 DIAGNOSIS — E43 Unspecified severe protein-calorie malnutrition: Secondary | ICD-10-CM | POA: Diagnosis not present

## 2020-05-08 DIAGNOSIS — Z96642 Presence of left artificial hip joint: Secondary | ICD-10-CM | POA: Insufficient documentation

## 2020-05-08 DIAGNOSIS — Z7982 Long term (current) use of aspirin: Secondary | ICD-10-CM | POA: Diagnosis not present

## 2020-05-08 DIAGNOSIS — N1832 Chronic kidney disease, stage 3b: Secondary | ICD-10-CM | POA: Diagnosis not present

## 2020-05-08 DIAGNOSIS — Z7189 Other specified counseling: Secondary | ICD-10-CM | POA: Insufficient documentation

## 2020-05-08 DIAGNOSIS — F0390 Unspecified dementia without behavioral disturbance: Secondary | ICD-10-CM | POA: Insufficient documentation

## 2020-05-08 DIAGNOSIS — Z7689 Persons encountering health services in other specified circumstances: Secondary | ICD-10-CM | POA: Diagnosis not present

## 2020-05-08 DIAGNOSIS — D649 Anemia, unspecified: Secondary | ICD-10-CM | POA: Diagnosis not present

## 2020-05-08 LAB — URINALYSIS, ROUTINE W REFLEX MICROSCOPIC
Bilirubin Urine: NEGATIVE
Glucose, UA: NEGATIVE mg/dL
Hgb urine dipstick: NEGATIVE
Ketones, ur: NEGATIVE mg/dL
Nitrite: NEGATIVE
Protein, ur: 30 mg/dL — AB
Specific Gravity, Urine: 1.009 (ref 1.005–1.030)
pH: 7 (ref 5.0–8.0)

## 2020-05-08 LAB — RESP PANEL BY RT-PCR (FLU A&B, COVID) ARPGX2
Influenza A by PCR: NEGATIVE
Influenza B by PCR: NEGATIVE
SARS Coronavirus 2 by RT PCR: NEGATIVE

## 2020-05-08 LAB — CBC WITH DIFFERENTIAL/PLATELET
Abs Immature Granulocytes: 0.02 10*3/uL (ref 0.00–0.07)
Basophils Absolute: 0 10*3/uL (ref 0.0–0.1)
Basophils Relative: 0 %
Eosinophils Absolute: 0.1 10*3/uL (ref 0.0–0.5)
Eosinophils Relative: 2 %
HCT: 38.6 % (ref 36.0–46.0)
Hemoglobin: 12.6 g/dL (ref 12.0–15.0)
Immature Granulocytes: 0 %
Lymphocytes Relative: 21 %
Lymphs Abs: 1.1 10*3/uL (ref 0.7–4.0)
MCH: 33.3 pg (ref 26.0–34.0)
MCHC: 32.6 g/dL (ref 30.0–36.0)
MCV: 102.1 fL — ABNORMAL HIGH (ref 80.0–100.0)
Monocytes Absolute: 0.5 10*3/uL (ref 0.1–1.0)
Monocytes Relative: 9 %
Neutro Abs: 3.5 10*3/uL (ref 1.7–7.7)
Neutrophils Relative %: 68 %
Platelets: 278 10*3/uL (ref 150–400)
RBC: 3.78 MIL/uL — ABNORMAL LOW (ref 3.87–5.11)
RDW: 16.4 % — ABNORMAL HIGH (ref 11.5–15.5)
WBC: 5.2 10*3/uL (ref 4.0–10.5)
nRBC: 0 % (ref 0.0–0.2)

## 2020-05-08 LAB — COMPREHENSIVE METABOLIC PANEL
ALT: 17 U/L (ref 0–44)
AST: 23 U/L (ref 15–41)
Albumin: 4.6 g/dL (ref 3.5–5.0)
Alkaline Phosphatase: 97 U/L (ref 38–126)
Anion gap: 11 (ref 5–15)
BUN: 29 mg/dL — ABNORMAL HIGH (ref 8–23)
CO2: 25 mmol/L (ref 22–32)
Calcium: 10.5 mg/dL — ABNORMAL HIGH (ref 8.9–10.3)
Chloride: 104 mmol/L (ref 98–111)
Creatinine, Ser: 1.36 mg/dL — ABNORMAL HIGH (ref 0.44–1.00)
GFR, Estimated: 40 mL/min — ABNORMAL LOW (ref >60)
Glucose, Bld: 96 mg/dL (ref 70–99)
Potassium: 4.8 mmol/L (ref 3.5–5.1)
Sodium: 140 mmol/L (ref 135–145)
Total Bilirubin: 0.7 mg/dL (ref 0.3–1.2)
Total Protein: 9.3 g/dL — ABNORMAL HIGH (ref 6.5–8.1)

## 2020-05-08 MED ORDER — ENSURE ENLIVE PO LIQD
237.0000 mL | Freq: Three times a day (TID) | ORAL | Status: DC
  Administered 2020-05-09 – 2020-05-12 (×6): 237 mL via ORAL
  Filled 2020-05-08 (×14): qty 237

## 2020-05-08 MED ORDER — LEVETIRACETAM 500 MG PO TABS
1000.0000 mg | ORAL_TABLET | Freq: Two times a day (BID) | ORAL | Status: DC
  Administered 2020-05-08: 1000 mg via ORAL
  Filled 2020-05-08 (×2): qty 2

## 2020-05-08 MED ORDER — AMLODIPINE BESYLATE 5 MG PO TABS
10.0000 mg | ORAL_TABLET | Freq: Every day | ORAL | Status: DC
  Administered 2020-05-09 – 2020-05-12 (×4): 10 mg via ORAL
  Filled 2020-05-08 (×4): qty 2

## 2020-05-08 MED ORDER — LACOSAMIDE 50 MG PO TABS
200.0000 mg | ORAL_TABLET | Freq: Two times a day (BID) | ORAL | Status: DC
  Administered 2020-05-08: 200 mg via ORAL
  Filled 2020-05-08 (×2): qty 4

## 2020-05-08 MED ORDER — ADULT MULTIVITAMIN W/MINERALS CH
1.0000 | ORAL_TABLET | Freq: Every day | ORAL | Status: DC
  Administered 2020-05-08 – 2020-05-12 (×5): 1 via ORAL
  Filled 2020-05-08 (×5): qty 1

## 2020-05-08 MED ORDER — DOCUSATE SODIUM 100 MG PO CAPS
100.0000 mg | ORAL_CAPSULE | Freq: Two times a day (BID) | ORAL | Status: DC
  Administered 2020-05-08 – 2020-05-12 (×8): 100 mg via ORAL
  Filled 2020-05-08 (×8): qty 1

## 2020-05-08 MED ORDER — SODIUM CHLORIDE 0.9 % IV BOLUS
500.0000 mL | Freq: Once | INTRAVENOUS | Status: AC
  Administered 2020-05-08: 500 mL via INTRAVENOUS

## 2020-05-08 MED ORDER — CARVEDILOL 12.5 MG PO TABS
12.5000 mg | ORAL_TABLET | Freq: Two times a day (BID) | ORAL | Status: DC
  Administered 2020-05-08 – 2020-05-12 (×8): 12.5 mg via ORAL
  Filled 2020-05-08 (×8): qty 1

## 2020-05-08 MED ORDER — POLYETHYLENE GLYCOL 3350 17 G PO PACK: 17.0000 g | PACK | Freq: Every day | ORAL | Status: DC | PRN

## 2020-05-08 MED ORDER — LOSARTAN POTASSIUM 25 MG PO TABS
100.0000 mg | ORAL_TABLET | Freq: Every day | ORAL | Status: DC
  Administered 2020-05-09 – 2020-05-12 (×4): 100 mg via ORAL
  Filled 2020-05-08 (×4): qty 4

## 2020-05-08 MED ORDER — FERROUS SULFATE 325 (65 FE) MG PO TABS
325.0000 mg | ORAL_TABLET | Freq: Every morning | ORAL | Status: DC
  Administered 2020-05-09 – 2020-05-12 (×3): 325 mg via ORAL
  Filled 2020-05-08 (×3): qty 1

## 2020-05-08 NOTE — ED Notes (Signed)
Patient provided with cardiac diet tray

## 2020-05-08 NOTE — ED Triage Notes (Signed)
Patient BIB friend, reports patient was recently out of rehab and patient cannot take care of herself. States children are not helping and she needs placement. Hx dementia. Patient has no complaints.

## 2020-05-08 NOTE — ED Notes (Signed)
Patient's social worker to bedside at this time

## 2020-05-08 NOTE — ED Provider Notes (Signed)
[facility]-EMERGENCY DEPT Provider Note   CSN: [id number] Arrival date & time: 05/08/20  1112  LEVEL 5 CAVEAT - DEMENTIA  History No chief complaint on file.   [name] is a 80 y.o. female.  HPI 80 year old female brought in by friend for being unable to take care of herself.  The history is limited as the patient has chronic dementia and the friend is not at the bedside.  The patient tells me she is here because both of her legs are weak and she cannot walk.  She endorses this is a been a couple days.  She also tells me that she got her hips replaced yesterday.  Level 5 caveat.  After initial work-up started, I discussed with patient's friend who has been helping her, Rollen Sox, who states that he has been trying to help take care of her over the last couple weeks and she got out of rehab.  However he can no longer do it anymore and she needs more help than he can provide.  She can walk with assistance but otherwise her left leg has had some chronic issues since the surgery and seems to be rotated abnormally.  Has less use of that leg since the surgery.  Otherwise she is not acutely ill but more that he can't handle taking care of her anymore.  Past Medical History:  Diagnosis Date  . Abdominal pain 02/24/2013  . Acute combined systolic and diastolic congestive heart failure (HCC) 02/24/2013  . Acute on chronic renal insufficiency 05/15/2015  . Acute renal failure (HCC) 12/28/2012  . Acute renal failure superimposed on stage 3 chronic kidney disease (HCC)   . Anemia 05/20/2015  . 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 Basal ganglia infarction (HCC) 01/08/2013  . CAP (community acquired pneumonia)   . Chest pain 05/14/2015  . Chronic combined systolic and diastolic CHF (congestive heart failure) (HCC) 03/06/2014  . Congestive heart failure (CHF) (HCC)   . Constipation 02/24/2013  . Cough   . Diastolic dysfunction-grade 3 05/20/2015   . Dyspnea 02/24/2013  . Encephalopathy 12/28/2012  . Epilepsy (HCC)   . Generalized seizure (HCC) 12/28/2012  . H/O: CVA (cerebrovascular accident) 01/04/2013  . Hyperammonemia (HCC) 05/17/2015  . Hyperglycemia 02/24/2013  . Hyperkalemia 02/11/2013  . Hypertension   . 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 Hyponatremia 02/11/2013  . Influenza A 02/11/2013  . Liver enzyme elevation 02/11/2013  . Metabolic acidosis 12/28/2012  . Metabolic encephalopathy 05/16/2015  . NICM (nonischemic cardiomyopathy) -EF 25% 05/20/2015  . Pleuritic chest pain 02/24/2013  . PNA (pneumonia) 05/20/2015  . Pneumobilia 02/12/2013  . Pneumonia   . Seizure (HCC) 12/28/2012  . Seizures (HCC)   . Smoker   . Stroke (HCC)   . Troponin level elevated 05/14/2015  . Volume overload 02/24/2013  . Weight gain 02/24/2013    Patient Active Problem List   Diagnosis Date Noted  . Closed fracture of left hip (HCC) 03/27/2020  . SIRS (systemic inflammatory response syndrome) (HCC) 03/27/2020  . Intertrochanteric fracture of right femur (HCC) 01/04/2020  . Humeral head fracture 01/04/2020  . Humeral surgical neck fracture 01/04/2020  . Witnessed seizure-like activity (HCC) 12/27/2019  . Chronic kidney disease, stage 3b (HCC) 12/27/2019  . Alzheimer's dementia without behavioral disturbance (HCC) 12/27/2019  . Gout 12/27/2019  . Leukocytosis 12/27/2019  . Cough   . Encounter for palliative care   . Goals of care, counseling/discussion   . Pneumonia   .  NICM (nonischemic cardiomyopathy) -EF 25% 05/20/2015  . No significant CAD at cath May 2012 05/20/2015  . Anemia 05/20/2015  . Diastolic dysfunction-grade 3 05/20/2015  . Acute renal failure superimposed on stage 3 chronic kidney disease (HCC)   . CAP (community acquired pneumonia)   . Hyperammonemia (HCC) 05/17/2015  . Metabolic encephalopathy 05/16/2015  . Acute on chronic renal insufficiency 05/15/2015  . Chest pain 05/14/2015  . Troponin level elevated  05/14/2015  . Chronic combined systolic and diastolic congestive heart failure (HCC) 03/06/2014  . Acute combined systolic and diastolic congestive heart failure (HCC) 02/24/2013  . Weight gain 02/24/2013  . Volume overload 02/24/2013  . Hyperglycemia 02/24/2013  . Dyspnea 02/24/2013  . Constipation 02/24/2013  . Abdominal pain 02/24/2013  . Pleuritic chest pain 02/24/2013  . Epilepsy (HCC)   . Smoker   . 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 Protein-calorie malnutrition, severe- wgt 86 lbs 02/13/2013  . Pneumobilia 02/12/2013  . Influenza A 02/11/2013  . Hyperkalemia 02/11/2013  . Hyponatremia 02/11/2013  . HCAP (healthcare-associated pneumonia) 02/11/2013  . Liver enzyme elevation 02/11/2013  . Basal ganglia infarction (HCC) 01/08/2013  . H/O: CVA (cerebrovascular accident) 01/04/2013  . Generalized seizure (HCC) 12/28/2012  . 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 Seizure disorder (HCC) 12/28/2012  . Metabolic acidosis 12/28/2012  . Encephalopathy 12/28/2012  . Acute renal failure (HCC) 12/28/2012    Past Surgical History:  Procedure Laterality Date  . FEMUR IM NAIL Right 01/05/2020  . INTRAMEDULLARY (IM) NAIL INTERTROCHANTERIC Right 01/05/2020   Procedure: INTRAMEDULLARY (IM) NAIL INTERTROCHANTRIC;  Surgeon: [name], MD;  Location: [facility] OR;  Service: Orthopedics;  Laterality: Right;  . NO PAST SURGERIES    . TOTAL HIP ARTHROPLASTY Left 03/29/2020   Procedure: TOTAL HIP ARTHROPLASTY ANTERIOR APPROACH;  Surgeon: Samson Frederic, MD;  Location: [facility] ORS;  Service: Orthopedics;  Laterality: Left;     OB History   No obstetric history on file.     Family History  Problem Relation Age of Onset  . Cancer Father   . Anemia Daughter     Social History   Tobacco Use  . Smoking status: Current Some Day Smoker    Packs/day: 0.25    Types: Cigarettes  . Smokeless tobacco: Never Used  . Tobacco comment: 1/2 caigarettes a day  Vaping  Use  . Vaping Use: Never used  Substance Use Topics  . Alcohol use: Yes    Alcohol/week: 0.0 standard drinks    Comment: occasional  . Drug use: No    Home Medications Prior to Admission medications   Medication Sig Start Date End Date Taking? Authorizing Provider  acetaminophen (TYLENOL) 325 MG tablet Take 650 mg by mouth every 6 (six) hours as needed.    [provider]  allopurinol (ZYLOPRIM) 100 MG tablet Take 1 tablet (100 mg total) by mouth daily. 04/17/20   Fargo, Amy E, NP  amLODipine (NORVASC) 10 MG tablet Take 1 tablet (10 mg total) by mouth daily. 04/17/20   Octavia Heir, NP  aspirin (ASPIRIN CHILDRENS) 81 MG chewable tablet Chew 1 tablet (81 mg total) by mouth 2 (two) times daily with a meal. 04/17/20 06/01/20  Fargo, Amy E, NP  carvedilol (COREG) 12.5 MG tablet Take 1 tablet (12.5 mg total) by mouth 2 (two) times daily with a meal. 04/17/20   Fargo, Amy E, NP  docusate sodium (COLACE) 100 MG capsule Take 1 capsule (100 mg total) by mouth 2 (two) times  daily. 01/09/20   Pahwani, Kasandra Knudsen, MD  donepezil (ARICEPT) 5 MG tablet Take 1 tablet (5 mg total) by mouth daily. 04/17/20   Fargo, Amy E, NP  Ensure (ENSURE) Take 237 mLs by mouth 3 (three) times daily between meals.    [provider]  FEROSUL 325 (65 Fe) MG tablet Take 1 tablet (325 mg total) by mouth every morning. 04/17/20   Fargo, Amy E, NP  lacosamide (VIMPAT) 200 MG TABS tablet Take 1 tablet (200 mg total) by mouth 2 (two) times daily. 04/17/20   Fargo, Amy E, NP  levETIRAcetam (KEPPRA) 1000 MG tablet Take 1 tablet (1,000 mg total) by mouth 2 (two) times daily. 04/17/20   Fargo, Amy E, NP  losartan (COZAAR) 100 MG tablet Take 1 tablet (100 mg total) by mouth daily. 04/17/20   Octavia Heir, NP  Multiple Vitamin (MULTIVITAMIN WITH MINERALS) TABS tablet Take 1 tablet by mouth daily. 01/10/20   Pahwani, Kasandra Knudsen, MD  polyethylene glycol (MIRALAX / GLYCOLAX) 17 g packet Take 17 g by mouth daily as needed for mild  constipation. 01/09/20   Pahwani, Kasandra Knudsen, MD    Allergies    Budesonide-formoterol fumarate, Lisinopril, Mirtazapine, and Sodium bicarbonate  Review of Systems   Review of Systems  Unable to perform ROS: Dementia    Physical Exam Updated Vital Signs BP (!) 159/82   Pulse 70   Temp 98.2 F (36.8 C) (Oral)   Resp 13   SpO2 96%   Physical Exam Vitals and nursing note reviewed.  Constitutional:      Appearance: She is well-developed.  HENT:     Head: Normocephalic and atraumatic.     Right Ear: External ear normal.     Left Ear: External ear normal.     Nose: Nose normal.  Eyes:     General:        Right eye: No discharge.        Left eye: No discharge.     Extraocular Movements: Extraocular movements intact.     Pupils: Pupils are equal, round, and reactive to light.  Cardiovascular:     Rate and Rhythm: Normal rate and regular rhythm.     Pulses:          Dorsalis pedis pulses are 2+ on the right side and 2+ on the left side.     Heart sounds: Normal heart sounds.  Pulmonary:     Effort: Pulmonary effort is normal.     Breath sounds: Normal breath sounds.  Abdominal:     Palpations: Abdomen is soft.     Tenderness: There is no abdominal tenderness.  Musculoskeletal:     Comments: No pain with ROM of hips  Skin:    General: Skin is warm and dry.  Neurological:     Mental Status: She is alert. She is disoriented.     Comments: 5/5 strength in RUE, RLE, LUE, 4/5 strength in LLE  Psychiatric:        Mood and Affect: Mood is not anxious.     ED Results / Procedures / Treatments   Labs (all labs ordered are listed, but only abnormal results are displayed) Labs Reviewed  COMPREHENSIVE METABOLIC PANEL - Abnormal; Notable for the following components:      Result Value   BUN 29 (*)    Creatinine, Ser 1.36 (*)    Calcium 10.5 (*)    Total Protein 9.3 (*)    GFR, Estimated 40 (*)  All other components within normal limits  CBC WITH DIFFERENTIAL/PLATELET -  Abnormal; Notable for the following components:   RBC 3.78 (*)    MCV 102.1 (*)    RDW 16.4 (*)    All other components within normal limits  RESP PANEL BY RT-PCR (FLU A&B, COVID) ARPGX2  URINALYSIS, ROUTINE W REFLEX MICROSCOPIC    EKG EKG Interpretation  Date/Time:  Friday May 08 2020 12:24:04 EDT Ventricular Rate:  66 PR Interval:  222 QRS Duration: 105 QT Interval:  444 QTC Calculation: 466 R Axis:   264 Text Interpretation: Sinus rhythm Prolonged PR interval Inferior infarct, old Anterior infarct, old Confirmed by Pricilla Loveless [phone]) on 05/08/2020 12:25:25 PM   Radiology No results found.  Procedures Procedures   Medications Ordered in ED Medications  amLODipine (NORVASC) tablet 10 mg (has no administration in time range)  carvedilol (COREG) tablet 12.5 mg (has no administration in time range)  docusate sodium (COLACE) capsule 100 mg (has no administration in time range)  feeding supplement (ENSURE ENLIVE / ENSURE PLUS) liquid 237 mL (has no administration in time range)  ferrous sulfate tablet 325 mg (has no administration in time range)  lacosamide (VIMPAT) tablet 200 mg (has no administration in time range)  levETIRAcetam (KEPPRA) tablet 1,000 mg (has no administration in time range)  losartan (COZAAR) tablet 100 mg (has no administration in time range)  polyethylene glycol (MIRALAX / GLYCOLAX) packet 17 g (has no administration in time range)  multivitamin with minerals tablet 1 tablet (has no administration in time range)    ED Course  I have reviewed the triage vital signs and the nursing notes.  Pertinent labs & imaging results that were available during my care of the patient were reviewed by me and considered in my medical decision making (see chart for details).    MDM Rules/Calculators/A&P                          Patient seems unable to care for herself.  The friend states that he gave her all of her meds this morning but he is unable to take  care of her any further.  Labs do not show any significant abnormalities compared to baseline besides perhaps some mild dehydration.  Otherwise, there is no obvious need for medical admission.  TOC has been consulted for placement.  Home meds have been ordered. Final Clinical Impression(s) / ED Diagnoses Final diagnoses:  Encounter for social work intervention    Rx / DC Orders ED Discharge Orders    None       Pricilla Loveless, MD 05/08/20 1453

## 2020-05-08 NOTE — ED Notes (Signed)
Patient provided with [place] sandwich, lemon lime soda and coffee per request

## 2020-05-08 NOTE — Evaluation (Signed)
Physical Therapy Evaluation Patient Details Name: [name] MRN: [id number] DOB: 1940-10-04 Today's Date: 05/08/2020   History of Present Illness  Pt is 80 yo female who was brought to the ED by her friend due to unable to manage at home since d/c from rehab.  Pt had recent fall with L hip fx and had hemi arthroplasty on 03/29/20 with d/c from rehab 04/22/20 per friend.  She was supposed to have [facility] but it was just getting started in past couple days and pt declined in time from d/c from SNF to now. Additionally, pt with fall in December with R hip fx s/p IM nail 01/05/20 and admitted with seizures 12/28/20.  Other PMH includes CHF, HTN, dementia  Clinical Impression  Pt admitted with above diagnosis. Pt had recent hip fracture (and other recent admissions) and has not returned to her baseline.  She was doing better when she left SNF but then did not get HH for nearly 2 weeks and declined in function.  Pt's friend has been providing support but is unable to continue to do so own his on.  Pt will benefit from PT services to advance mobility.   Pt currently with functional limitations due to the deficits listed below (see PT Problem List). Pt will benefit from skilled PT to increase their independence and safety with mobility.  In regards to d/c recommendations - pt will need 24 hr support and max HH services.  If friend is not able to get support, then pt could benefit from return to SNF to return to her baseline.     Follow Up Recommendations Other (comment) (Pt needs 24 hr supervision and HH services if unable then may will need SNF)    Equipment Recommendations  None recommended by PT    Recommendations for Other Services       Precautions / Restrictions Precautions Precautions: Fall Restrictions Other Position/Activity Restrictions: WBAT; no hip precautions from recent sx      Mobility  Bed Mobility Overal bed mobility: Needs Assistance Bed Mobility: Supine to Sit;Sit to  Supine     Supine to sit: Min assist Sit to supine: Min assist   General bed mobility comments: Cues and increased time; min A for legs and to lift trunk    Transfers Overall transfer level: Needs assistance Equipment used: Rolling walker (2 wheeled) Transfers: Sit to/from Stand Sit to Stand: Min assist         General transfer comment: min A to steady and rise  Ambulation/Gait Ambulation/Gait assistance: Min Chemical engineer (Feet): 25 Feet Assistive device: Rolling walker (2 wheeled) Gait Pattern/deviations: Step-to pattern Gait velocity: decreased   General Gait Details: bil toe in; min A to steady and direct walker  Stairs            Wheelchair Mobility    Modified Rankin (Stroke Patients Only)       Balance Overall balance assessment: Needs assistance Sitting-balance support: No upper extremity supported Sitting balance-Leahy Scale: Fair     Standing balance support: Bilateral upper extremity supported Standing balance-Leahy Scale: Poor Standing balance comment: requiring RW and min A                             Pertinent Vitals/Pain Pain Assessment: Faces Faces Pain Scale: Hurts a little bit Pain Location: L hip Pain Descriptors / Indicators: Discomfort Pain Intervention(s): Limited activity within patient's tolerance;Monitored during session    Home Living Family/patient  expects to be discharged to:: Unsure Living Arrangements: Non-relatives/Friends Available Help at Discharge: Friend(s);Available 24 hours/day Type of Home: House Home Access: Stairs to enter Entrance Stairs-Rails: None Entrance Stairs-Number of Steps: 3 Home Layout: One level Home Equipment: Shower seat;Bedside commode;Walker - 2 wheels;Cane - quad      Prior Function Level of Independence: Needs assistance   Gait / Transfers Assistance Needed: Pt has had several recent admissions due to seizure, fall R hip fx, fall L hip fx.  Prior to these she was  independent at home.  Since latest hip fx pt ambulating with RW but requiring assist.  Pt's friend who is assisting at home reports pt has declined in function since d/c from SNF as HH did not get started efficiently (pt home for nearly 2 weeks per report)  ADL's / Homemaking Assistance Needed: Prior to recent admissions was able to perform ADLs - but since hip fx requiring assist.  Has help with IADLs and driving baseline  Comments: Prior history obtained from pt, friend, and previous admissions     Hand Dominance        Extremity/Trunk Assessment   Upper Extremity Assessment Upper Extremity Assessment: Generalized weakness    Lower Extremity Assessment Lower Extremity Assessment: LLE deficits/detail;Generalized weakness;RLE deficits/detail RLE Deficits / Details: bil toe-in LLE Deficits / Details: bil toe-in    Cervical / Trunk Assessment Cervical / Trunk Assessment: Normal  Communication   Communication: No difficulties  Cognition Arousal/Alertness: Awake/alert Behavior During Therapy: WFL for tasks assessed/performed Overall Cognitive Status: History of cognitive impairments - at baseline                                        General Comments General comments (skin integrity, edema, etc.): Pt's friend present and worker from adult protective services    Exercises     Assessment/Plan    PT Assessment Patient needs continued PT services  PT Problem List Decreased strength;Decreased safety awareness;Decreased mobility;Decreased range of motion;Decreased activity tolerance;Decreased balance;Decreased cognition;Decreased knowledge of use of DME       PT Treatment Interventions DME instruction;Therapeutic activities;Gait training;Therapeutic exercise;Patient/family education;Balance training;Stair training;Functional mobility training    PT Goals (Current goals can be found in the Care Plan section)  Acute Rehab PT Goals Patient Stated Goal: return to  walking and independence PT Goal Formulation: With patient/family Time For Goal Achievement: 05/22/20 Potential to Achieve Goals: Good    Frequency Min 3X/week   Barriers to discharge Decreased caregiver support      Co-evaluation               AM-PAC PT "6 Clicks" Mobility  Outcome Measure Help needed turning from your back to your side while in a flat bed without using bedrails?: A Little Help needed moving from lying on your back to sitting on the side of a flat bed without using bedrails?: A Little Help needed moving to and from a bed to a chair (including a wheelchair)?: A Little Help needed standing up from a chair using your arms (e.g., wheelchair or bedside chair)?: A Little Help needed to walk in hospital room?: A Little Help needed climbing 3-5 steps with a railing? : A Lot 6 Click Score: 17    End of Session Equipment Utilized During Treatment: Gait belt Activity Tolerance: Patient tolerated treatment well Patient left: in bed;with call bell/phone within reach;with bed alarm set;with family/visitor present Nurse  Communication: Mobility status PT Visit Diagnosis: Unsteadiness on feet (R26.81);Other abnormalities of gait and mobility (R26.89);Muscle weakness (generalized) (M62.81)    Time: 1191-4782 PT Time Calculation (min) (ACUTE ONLY): 30 min   Charges:   PT Evaluation $PT Eval Moderate Complexity: 1 Mod PT Treatments $Gait Training: 8-22 mins        Anise Salvo, PT Acute Rehab Services Pager [phone] [facility] [phone]    Rayetta Humphrey 05/08/2020, 5:25 PM

## 2020-05-09 MED ORDER — LEVETIRACETAM 500 MG PO TABS
1000.0000 mg | ORAL_TABLET | Freq: Two times a day (BID) | ORAL | Status: DC
  Administered 2020-05-09 – 2020-05-12 (×7): 1000 mg via ORAL
  Filled 2020-05-09 (×7): qty 2

## 2020-05-09 MED ORDER — LACOSAMIDE 50 MG PO TABS
200.0000 mg | ORAL_TABLET | Freq: Two times a day (BID) | ORAL | Status: DC
  Administered 2020-05-09 – 2020-05-12 (×7): 200 mg via ORAL
  Filled 2020-05-09 (×7): qty 4

## 2020-05-09 MED ORDER — ALLOPURINOL 100 MG PO TABS
100.0000 mg | ORAL_TABLET | Freq: Every day | ORAL | Status: DC
  Administered 2020-05-10 – 2020-05-12 (×3): 100 mg via ORAL
  Filled 2020-05-09 (×4): qty 1

## 2020-05-09 MED ORDER — DONEPEZIL HCL 5 MG PO TABS
5.0000 mg | ORAL_TABLET | Freq: Every day | ORAL | Status: DC
  Administered 2020-05-09 – 2020-05-11 (×3): 5 mg via ORAL
  Filled 2020-05-09 (×3): qty 1

## 2020-05-09 NOTE — ED Notes (Signed)
Holding pt's Vimpat and Keppra at this time. Spoke with [place] in Pharmacy to clarify doasages of these medications. Per Pharmacy, pharmacy to contact pt's family to verify med rec. Verified with ED provider.

## 2020-05-09 NOTE — ED Notes (Signed)
Pt given lunch tray.

## 2020-05-09 NOTE — ED Notes (Signed)
Pt changed and cleaned up. Pt alert, breakfast tray at bedside.

## 2020-05-09 NOTE — ED Notes (Signed)
Verified with both Jairo Ben, from pharmacy and Holley Dexter from pharmacy that pt's daughter was notified and pt's daughter verified the pt's Keppra and Vimpat dosages are correct. ED provider notified and aware and Keppra and Vimpat re-ordered.

## 2020-05-09 NOTE — TOC Initial Note (Addendum)
Transition of Care [facility]) - Initial/Assessment Note    Patient Details  Name: [name] MRN: [id number] Date of Birth: 10-07-1940  Transition of Care [facility]) CM/SW Contact:    Elliot Cousin, RN Phone Number: [phone] 05/09/2020, 12:03 PM  Clinical Narrative:                  TOC CM received referral to assist with SNF placement. Pt was recently at [facility] for rehab. She wanted to go home with [facility], due to cost of continued stay was $180. Pt needs SNF rehab. Attempted call to dtr, Latoya HCPOA and left message for return call. Spoke to [employer], long time friend, states he just helps pt with going to appts and visits. States she lives alone and unable to manage at home alone. States their is a pending DSS APS case. Her dtr or son will be best contact.   Per Sander Radon, DSS APS [phone] (on call CSW), case was opened in March.    Expected Discharge Plan: Skilled Nursing Facility Barriers to Discharge: SNF Pending bed offer   Patient Goals and CMS Choice Patient states their goals for this hospitalization and ongoing recovery are:: needs continued rehab CMS Medicare.gov Compare Post Acute Care list provided to:: Patient Represenative (must comment) (Charles Jenkins-friend) Choice offered to / list presented to : Adult Children  Expected Discharge Plan and Services Expected Discharge Plan: Skilled Nursing Facility In-house Referral: Clinical Social Work Discharge Planning Services: CM Consult Post Acute Care Choice: Skilled Nursing Facility Living arrangements for the past 2 months: Single Family Home (renter)                                      Prior Living Arrangements/Services Living arrangements for the past 2 months: Single Family Home (renter) Lives with:: Self   Do you feel safe going back to the place where you live?: No   no caregiver  Need for Family Participation in Patient Care: Yes (Comment) Care giver support  system in place?: Yes (comment)   Criminal Activity/Legal Involvement Pertinent to Current Situation/Hospitalization: No - Comment as needed  Activities of Daily Living      Permission Sought/Granted Permission sought to share information with : Case Manager,PCP,Family Supports,Facility Contact Representative Permission granted to share information with : Yes, Verbal Permission Granted  Share Information with NAME: Rollen Sox  Permission granted to share info w AGENCY: SNF rehab  Permission granted to share info w Relationship: friend  Permission granted to share info w Contact Information: [phone]  Emotional Assessment       Orientation: : Oriented to Self   Psych Involvement: No (comment)  Admission diagnosis:  diff walking Patient Active Problem List   Diagnosis Date Noted  . Closed fracture of left hip (HCC) 03/27/2020  . SIRS (systemic inflammatory response syndrome) (HCC) 03/27/2020  . Intertrochanteric fracture of right femur (HCC) 01/04/2020  . Humeral head fracture 01/04/2020  . Humeral surgical neck fracture 01/04/2020  . Witnessed seizure-like activity (HCC) 12/27/2019  . Chronic kidney disease, stage 3b (HCC) 12/27/2019  . Alzheimer's dementia without behavioral disturbance (HCC) 12/27/2019  . Gout 12/27/2019  . Leukocytosis 12/27/2019  . Cough   . Encounter for palliative care   . Goals of care, counseling/discussion   . Pneumonia   . NICM (nonischemic cardiomyopathy) -EF 25% 05/20/2015  . No significant CAD  at cath May 2012 05/20/2015  . Anemia 05/20/2015  . Diastolic dysfunction-grade 3 05/20/2015  . Acute renal failure superimposed on stage 3 chronic kidney disease (HCC)   . CAP (community acquired pneumonia)   . Hyperammonemia (HCC) 05/17/2015  . Metabolic encephalopathy 05/16/2015  . Acute on chronic renal insufficiency 05/15/2015  . Chest pain 05/14/2015  . Troponin level elevated 05/14/2015  . Chronic combined systolic and diastolic  congestive heart failure (HCC) 03/06/2014  . Acute combined systolic and diastolic congestive heart failure (HCC) 02/24/2013  . Weight gain 02/24/2013  . Volume overload 02/24/2013  . Hyperglycemia 02/24/2013  . Dyspnea 02/24/2013  . Constipation 02/24/2013  . Abdominal pain 02/24/2013  . Pleuritic chest pain 02/24/2013  . Epilepsy (HCC)   . Smoker   . 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 Protein-calorie malnutrition, severe- wgt 86 lbs 02/13/2013  . Pneumobilia 02/12/2013  . Influenza A 02/11/2013  . Hyperkalemia 02/11/2013  . Hyponatremia 02/11/2013  . HCAP (healthcare-associated pneumonia) 02/11/2013  . Liver enzyme elevation 02/11/2013  . Basal ganglia infarction (HCC) 01/08/2013  . H/O: CVA (cerebrovascular accident) 01/04/2013  . Generalized seizure (HCC) 12/28/2012  . 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 Seizure disorder (HCC) 12/28/2012  . Metabolic acidosis 12/28/2012  . Encephalopathy 12/28/2012  . Acute renal failure (HCC) 12/28/2012   PCP:  [name], MD Pharmacy:   [id number] - [place], [place] - [address] AT [facility] OF [address] [place] [address] Phone: [phone] Fax: [phone]  [facility] - [place], [place] - [address] [place] [address] Phone: [phone] Fax: [phone]     Social Determinants of Health (SDOH) Interventions    Readmission Risk Interventions No flowsheet data found.

## 2020-05-09 NOTE — ED Notes (Signed)
This nurse spoke with case management. Per Cathlean Cower, with Case Management, she is to assess pt today and pt will continue to hold in the ED at this time.

## 2020-05-09 NOTE — ED Notes (Signed)
Pt resting quietly in ED stretcher. Alert and oriented to person and place, answers incorrectly to current year. VSS at this time. Pt denies any complaints or concerns at this time.

## 2020-05-09 NOTE — Progress Notes (Addendum)
245 pm [employer] # [id number] contacted to start referral, reference # [id number]. Will assign UR RN and will give call back to request clinicals on 05/11/2020. Isidoro Donning RN CCM, [facility] ED TOC CM [phone]   1247 pm  TOC CM spoke to pt and she does not believe she can go home with [facility]. She gave permission to create [facility] and fax referral. And she agrees to returning to [facility] for SNF and paying copay days. She completed her 20 days of rehab. Isidoro Donning RN CCM, [facility] ED TOC CM [phone]

## 2020-05-09 NOTE — ED Provider Notes (Signed)
  Physical Exam  BP 135/76 (BP Location: Right Arm)   Pulse 77   Temp 97.6 F (36.4 C) (Oral)   Resp 18   Ht 5' (1.524 m)   Wt 36.5 kg   SpO2 100%   BMI 15.72 kg/m   Physical Exam  ED Course/Procedures     Procedures  MDM  Patient pending placement.  Going to skilled nursing reportedly.  Home meds have been updated.       Benjiman Core, MD 05/09/20 1421

## 2020-05-09 NOTE — Progress Notes (Addendum)
. Transition of Care [facility]) - Emergency Department Mini Assessment   Patient Details  Name: [name] MRN: [id number] Date of Birth: 02/22/1940  Transition of Care [facility]) CM/SW Contact:    Elliot Cousin, RN Phone Number: [phone] 05/09/2020, 12:27 PM   Clinical Narrative: [facility] CM referral for SNF placement. Spoke to pt and gave permission to speak to dtr, [place] or friend, Leonette Most. Attempted call to pt's caregiver/HCPOA, Latoya. Left message will follow up with [facility]. Message sent to ED provider for PT consult.    ED Mini Assessment: What brought you to the Emergency Department? : unable to ambulate, weakness  Barriers to Discharge: SNF Pending bed offer  Barrier interventions: attempted call to dtr, Latoya, left message     Interventions which prevented an admission or readmission: SNF Placement    Patient Contact and Communications Key Contact 1: Thurmond Butts     Contact Date: 05/08/20,   Contact time: 1700 Contact Phone Number: [phone] Call outcome: left message  Patient states their goals for this hospitalization and ongoing recovery are:: needs continued rehab CMS Medicare.gov Compare Post Acute Care list provided to:: Patient Represenative (must comment) (Charles Jenkins-friend) Choice offered to / list presented to : Adult Children  Admission diagnosis:  diff walking Patient Active Problem List   Diagnosis Date Noted  . Closed fracture of left hip (HCC) 03/27/2020  . SIRS (systemic inflammatory response syndrome) (HCC) 03/27/2020  . Intertrochanteric fracture of right femur (HCC) 01/04/2020  . Humeral head fracture 01/04/2020  . Humeral surgical neck fracture 01/04/2020  . Witnessed seizure-like activity (HCC) 12/27/2019  . Chronic kidney disease, stage 3b (HCC) 12/27/2019  . Alzheimer's dementia without behavioral disturbance (HCC) 12/27/2019  . Gout 12/27/2019  . Leukocytosis 12/27/2019  . Cough   . Encounter for  palliative care   . Goals of care, counseling/discussion   . Pneumonia   . NICM (nonischemic cardiomyopathy) -EF 25% 05/20/2015  . No significant CAD at cath May 2012 05/20/2015  . Anemia 05/20/2015  . Diastolic dysfunction-grade 3 05/20/2015  . Acute renal failure superimposed on stage 3 chronic kidney disease (HCC)   . CAP (community acquired pneumonia)   . Hyperammonemia (HCC) 05/17/2015  . Metabolic encephalopathy 05/16/2015  . Acute on chronic renal insufficiency 05/15/2015  . Chest pain 05/14/2015  . Troponin level elevated 05/14/2015  . Chronic combined systolic and diastolic congestive heart failure (HCC) 03/06/2014  . Acute combined systolic and diastolic congestive heart failure (HCC) 02/24/2013  . Weight gain 02/24/2013  . Volume overload 02/24/2013  . Hyperglycemia 02/24/2013  . Dyspnea 02/24/2013  . Constipation 02/24/2013  . Abdominal pain 02/24/2013  . Pleuritic chest pain 02/24/2013  . Epilepsy (HCC)   . Smoker   . 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 Protein-calorie malnutrition, severe- wgt 86 lbs 02/13/2013  . Pneumobilia 02/12/2013  . Influenza A 02/11/2013  . Hyperkalemia 02/11/2013  . Hyponatremia 02/11/2013  . HCAP (healthcare-associated pneumonia) 02/11/2013  . Liver enzyme elevation 02/11/2013  . Basal ganglia infarction (HCC) 01/08/2013  . H/O: CVA (cerebrovascular accident) 01/04/2013  . Generalized seizure (HCC) 12/28/2012  . 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 Seizure disorder (HCC) 12/28/2012  . Metabolic acidosis 12/28/2012  . Encephalopathy 12/28/2012  . Acute renal failure (HCC) 12/28/2012   PCP:  [name], MD Pharmacy:   [facility] [phone] - [place], [place] - [address] AT [facility] OF [address] [place] [address] Phone: [phone] Fax: [phone]  [facility] - [place], [place] - [place] [address] [place] [address] Phone: [phone] Fax: [phone]

## 2020-05-09 NOTE — ED Notes (Addendum)
Case managment at bedside

## 2020-05-09 NOTE — Progress Notes (Addendum)
Medication history pharmacy technician verified home medications and doses with friend that helps patient with her medications (and provider note from 04/17/2020)-notified Judith Blonder, RN, and explained to her to go ahead and give AM seizure medications (lacosamide and levetiracetam), as doses ordered are correct.

## 2020-05-09 NOTE — NC FL2 (Signed)
[place] MEDICAID FL2 LEVEL OF CARE SCREENING TOOL     IDENTIFICATION  Patient Name: [name] Birthdate: 10/08/1940 Sex: female Admission Date (Current Location): 05/08/2020  [facility] and [place] Number:  Producer, television/film/video and Address:  [facility],  [address], [place] [address]      Provider Number: [id number]  Attending Physician Name and Address:  Default, Provider, MD  Relative Name and Phone Number:  Marjory, Meints Daughter   [phone]    Current Level of Care: SNF Recommended Level of Care: Skilled Nursing Facility Prior Approval Number:    Date Approved/Denied:   PASRR Number: [id number]  Discharge Plan: SNF    Current Diagnoses: Patient Active Problem List   Diagnosis Date Noted  . Closed fracture of left hip (HCC) 03/27/2020  . SIRS (systemic inflammatory response syndrome) (HCC) 03/27/2020  . Intertrochanteric fracture of right femur (HCC) 01/04/2020  . Humeral head fracture 01/04/2020  . Humeral surgical neck fracture 01/04/2020  . Witnessed seizure-like activity (HCC) 12/27/2019  . Chronic kidney disease, stage 3b (HCC) 12/27/2019  . Alzheimer's dementia without behavioral disturbance (HCC) 12/27/2019  . Gout 12/27/2019  . Leukocytosis 12/27/2019  . Cough   . Encounter for palliative care   . Goals of care, counseling/discussion   . Pneumonia   . NICM (nonischemic cardiomyopathy) -EF 25% 05/20/2015  . No significant CAD at cath May 2012 05/20/2015  . Anemia 05/20/2015  . Diastolic dysfunction-grade 3 05/20/2015  . Acute renal failure superimposed on stage 3 chronic kidney disease (HCC)   . CAP (community acquired pneumonia)   . Hyperammonemia (HCC) 05/17/2015  . Metabolic encephalopathy 05/16/2015  . Acute on chronic renal insufficiency 05/15/2015  . Chest pain 05/14/2015  . Troponin level elevated 05/14/2015  . Chronic combined systolic and diastolic congestive heart failure (HCC) 03/06/2014  . Acute  combined systolic and diastolic congestive heart failure (HCC) 02/24/2013  . Weight gain 02/24/2013  . Volume overload 02/24/2013  . Hyperglycemia 02/24/2013  . Dyspnea 02/24/2013  . Constipation 02/24/2013  . Abdominal pain 02/24/2013  . Pleuritic chest pain 02/24/2013  . Epilepsy (HCC)   . Smoker   . 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 Protein-calorie malnutrition, severe- wgt 86 lbs 02/13/2013  . Pneumobilia 02/12/2013  . Influenza A 02/11/2013  . Hyperkalemia 02/11/2013  . Hyponatremia 02/11/2013  . HCAP (healthcare-associated pneumonia) 02/11/2013  . Liver enzyme elevation 02/11/2013  . Basal ganglia infarction (HCC) 01/08/2013  . H/O: CVA (cerebrovascular accident) 01/04/2013  . Generalized seizure (HCC) 12/28/2012  . 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 Seizure disorder (HCC) 12/28/2012  . Metabolic acidosis 12/28/2012  . Encephalopathy 12/28/2012  . Acute renal failure (HCC) 12/28/2012    Orientation RESPIRATION BLADDER Height & Weight     Self  Normal Continent Weight: 36.5 kg Height:  5' (152.4 cm)  BEHAVIORAL SYMPTOMS/MOOD NEUROLOGICAL BOWEL NUTRITION STATUS      Continent Diet (Regular)  AMBULATORY STATUS COMMUNICATION OF NEEDS Skin   Extensive Assist Verbally Normal                       Personal Care Assistance Level of Assistance  Bathing,Feeding,Dressing Bathing Assistance: Limited assistance Feeding assistance: Independent Dressing Assistance: Limited assistance     Functional Limitations Info  Sight,Hearing,Speech Sight Info: Adequate Hearing Info: Adequate Speech Info: Adequate    SPECIAL CARE FACTORS FREQUENCY  OT (By licensed OT),PT (By licensed PT)     PT Frequency: 5x per  week OT Frequency: 5x per week            Contractures Contractures Info: Not present    Additional Factors Info  Code Status,Allergies Code Status Info: Full Code Allergies Info: Budesonide-formoterol Fumarate,  fish, sodium bicarbonate, banana, lisinopril, mirtazapine           Current Medications (05/09/2020):  This is the current hospital active medication list Current Facility-Administered Medications  Medication Dose Route Frequency Provider Last Rate Last Admin  . allopurinol (ZYLOPRIM) tablet 100 mg  100 mg Oral Daily Benjiman Core, MD      . amLODipine (NORVASC) tablet 10 mg  10 mg Oral Daily Pricilla Loveless, MD   10 mg at 05/09/20 1026  . carvedilol (COREG) tablet 12.5 mg  12.5 mg Oral BID WC Pricilla Loveless, MD   12.5 mg at 05/09/20 1027  . docusate sodium (COLACE) capsule 100 mg  100 mg Oral BID Pricilla Loveless, MD   100 mg at 05/09/20 1028  . donepezil (ARICEPT) tablet 5 mg  5 mg Oral Daily Benjiman Core, MD      . feeding supplement (ENSURE ENLIVE / ENSURE PLUS) liquid 237 mL  237 mL Oral TID BM Pricilla Loveless, MD   237 mL at 05/09/20 1028  . ferrous sulfate tablet 325 mg  325 mg Oral q morning Pricilla Loveless, MD   325 mg at 05/09/20 1029  . lacosamide (VIMPAT) tablet 200 mg  200 mg Oral BID Pricilla Loveless, MD   200 mg at 05/08/20 2316  . levETIRAcetam (KEPPRA) tablet 1,000 mg  1,000 mg Oral BID Pricilla Loveless, MD   1,000 mg at 05/08/20 2315  . losartan (COZAAR) tablet 100 mg  100 mg Oral Daily Pricilla Loveless, MD   100 mg at 05/09/20 1030  . multivitamin with minerals tablet 1 tablet  1 tablet Oral Daily Pricilla Loveless, MD   1 tablet at 05/09/20 1030  . polyethylene glycol (MIRALAX / GLYCOLAX) packet 17 g  17 g Oral Daily PRN Pricilla Loveless, MD       Current Outpatient Medications  Medication Sig Dispense Refill  . acetaminophen (TYLENOL) 325 MG tablet Take 650 mg by mouth every 6 (six) hours as needed.    Marland Kitchen allopurinol (ZYLOPRIM) 100 MG tablet Take 1 tablet (100 mg total) by mouth daily. 30 tablet 0  . amLODipine (NORVASC) 10 MG tablet Take 1 tablet (10 mg total) by mouth daily. 30 tablet 0  . carvedilol (COREG) 12.5 MG tablet Take 1 tablet (12.5 mg total) by mouth 2  (two) times daily with a meal. 60 tablet 0  . docusate sodium (COLACE) 100 MG capsule Take 1 capsule (100 mg total) by mouth 2 (two) times daily. 10 capsule 0  . donepezil (ARICEPT) 5 MG tablet Take 1 tablet (5 mg total) by mouth daily. 30 tablet 0  . Ensure (ENSURE) Take 237 mLs by mouth 3 (three) times daily between meals.    . FEROSUL 325 (65 Fe) MG tablet Take 1 tablet (325 mg total) by mouth every morning. 30 tablet 0  . lacosamide (VIMPAT) 200 MG TABS tablet Take 1 tablet (200 mg total) by mouth 2 (two) times daily. 60 tablet 0  . levETIRAcetam (KEPPRA) 1000 MG tablet Take 1 tablet (1,000 mg total) by mouth 2 (two) times daily. 60 tablet 0  . losartan (COZAAR) 100 MG tablet Take 1 tablet (100 mg total) by mouth daily. 30 tablet 0  . aspirin (ASPIRIN CHILDRENS) 81 MG chewable tablet Chew 1  tablet (81 mg total) by mouth 2 (two) times daily with a meal. 90 tablet 0  . Multiple Vitamin (MULTIVITAMIN WITH MINERALS) TABS tablet Take 1 tablet by mouth daily. (Patient not taking: No sig reported)    . polyethylene glycol (MIRALAX / GLYCOLAX) 17 g packet Take 17 g by mouth daily as needed for mild constipation. (Patient not taking: No sig reported) 14 each 0     Discharge Medications: Please see discharge summary for a list of discharge medications.  Relevant Imaging Results:  Relevant Lab Results:   Additional Information SSN: [id number] Vaccinated/booster  Elliot Cousin, RN

## 2020-05-09 NOTE — ED Notes (Signed)
Report received from Alyssa Mallard, RN.  

## 2020-05-09 NOTE — ED Notes (Signed)
Case management at the bedside

## 2020-05-09 NOTE — ED Notes (Signed)
Pt cleaned-up by ED tech. New brief and purewick placed and linens changed. Pt sitting upright in ED stretcher eating from breakfast tray.

## 2020-05-10 NOTE — ED Notes (Signed)
Pt was wet so staff changed bedding, gown and brief

## 2020-05-10 NOTE — Progress Notes (Addendum)
11am: CSW spokew with Olegario Messier at [facility] extended a bed offer for [facility] due to the possibility of the patient needing long term care.  9:30am: CSW spoke with Durward Mallard at [facility] - they do not have beds today, but do have scheduled discharges for tomorrow. Camille to review patient's information in hub to possibly extend a bed offer.  CSW attempted to reach [place] at [facility] - no answer, a voicemail was left requesting a return call.  Edwin Dada, MSW, LCSW Transitions of Care  Clinical Social Worker II [phone]

## 2020-05-11 NOTE — ED Notes (Signed)
No distress noted when checked frequently.  

## 2020-05-11 NOTE — ED Notes (Signed)
Patient sat up for a short time to eat lunch meal.  Patient appears to be weak and does not tolerate sitting up for long periods of time.  Patient refuses her protein shake when offered

## 2020-05-11 NOTE — Progress Notes (Signed)
TOC CM spoke to pt at bedside and dtr on speaker, Latoya. Discussed how to get finances to pay for her copay days. She approved by [facility] for continued stay at facility but she need to pay $190 per day for copay. Dtr will need to restart application for Medicaid Special Application for Long Term Care. TOC CM contacted [facility], Tonya to discuss placement. Waiting approval for facility Director.   [facility] auth # [id number], next review on 05/14/2020 by 5 pm, spoke to [place], #[phone] ext [phone]. Fax clinicals #[phone]. Updated facility, [facility] rep, Tonya. Isidoro Donning RN CCM, [facility] ED TOC CM [phone]

## 2020-05-11 NOTE — Progress Notes (Signed)
Physical Therapy Treatment Patient Details Name: [name] MRN: [id number] DOB: [birth year] Today's Date: 05/11/2020    History of Present Illness Pt is 80 yo female who was brought to the ED by her friend due to unable to manage at home since d/c from rehab.  Pt had recent fall with L hip fx and had hemi arthroplasty on 03/29/20 with d/c from rehab 04/22/20 per friend.  She was supposed to have [facility] but it was just getting started in past couple days and pt declined in time from d/c from SNF to now. Additionally, pt with fall in December with R hip fx s/p IM nail 01/05/20 and admitted with seizures 12/28/20.  Other PMH includes CHF, HTN, dementia    PT Comments    Pt in bed with lunch tray.  AxO x 3 very pleasant.  Assisted with applying shoes then amb in hallway.  General Gait Details: bil toe in; min A to steady and direct walker   Unsteady esp with turns  HIGH FALL RISK Pt will need ST Rehab at SNF prior to safely returning home.    Follow Up Recommendations  SNF     Equipment Recommendations  None recommended by PT    Recommendations for Other Services       Precautions / Restrictions Precautions Precautions: Fall Restrictions Weight Bearing Restrictions: No Other Position/Activity Restrictions: WBAT; no hip precautions from recent sx    Mobility  Bed Mobility Overal bed mobility: Needs Assistance Bed Mobility: Supine to Sit;Sit to Supine     Supine to sit: Min assist Sit to supine: Min guard;Min assist   General bed mobility comments: Cues and increased time; min A for legs and to lift trunk    Transfers Overall transfer level: Needs assistance Equipment used: Rolling walker (2 wheeled) Transfers: Sit to/from Stand Sit to Stand: Min assist         General transfer comment: min A to steady and rise plus safety with turns  Ambulation/Gait Ambulation/Gait assistance: Min assist Gait Distance (Feet): 22 Feet Assistive device: Rolling walker (2  wheeled) Gait Pattern/deviations: Step-to pattern Gait velocity: decreased   General Gait Details: bil toe in; min A to steady and direct walker   Unsteady esp with turns  HIGH FALL RISK   Stairs             Wheelchair Mobility    Modified Rankin (Stroke Patients Only)       Balance                                            Cognition Arousal/Alertness: Awake/alert Behavior During Therapy: WFL for tasks assessed/performed Overall Cognitive Status: History of cognitive impairments - at baseline                                 General Comments: AxO x 3 pleasant      Exercises      General Comments        Pertinent Vitals/Pain Pain Assessment: Faces Faces Pain Scale: Hurts a little bit Pain Location: B hips but L>R Pain Descriptors / Indicators: Discomfort;Grimacing Pain Intervention(s): Monitored during session;Repositioned    Home Living                      Prior Function  PT Goals (current goals can now be found in the care plan section) Progress towards PT goals: Progressing toward goals    Frequency    Min 3X/week      PT Plan Current plan remains appropriate    Co-evaluation              AM-PAC PT "6 Clicks" Mobility   Outcome Measure  Help needed turning from your back to your side while in a flat bed without using bedrails?: A Lot Help needed moving from lying on your back to sitting on the side of a flat bed without using bedrails?: A Lot Help needed moving to and from a bed to a chair (including a wheelchair)?: A Lot Help needed standing up from a chair using your arms (e.g., wheelchair or bedside chair)?: A Lot Help needed to walk in hospital room?: A Lot Help needed climbing 3-5 steps with a railing? : Total 6 Click Score: 11    End of Session Equipment Utilized During Treatment: Gait belt Activity Tolerance: Patient tolerated treatment well Patient left: in bed;with  call bell/phone within reach;with bed alarm set;with family/visitor present Nurse Communication: Mobility status PT Visit Diagnosis: Unsteadiness on feet (R26.81);Other abnormalities of gait and mobility (R26.89);Muscle weakness (generalized) (M62.81)     Time: 1450-1520 PT Time Calculation (min) (ACUTE ONLY): 30 min  Charges:  $Gait Training: 8-22 mins $Therapeutic Activity: 8-22 mins                     Felecia Shelling  PTA Acute  Rehabilitation Services Pager      [phone] Office      [phone]

## 2020-05-11 NOTE — ED Notes (Signed)
No changes noted when checked frequently.  The patient is sleeping most of the time when checked but arouses easily when spoken to

## 2020-05-11 NOTE — ED Notes (Signed)
Ambulating in hallway with Physical Therapy- appears to tolerate well with use of walker

## 2020-05-11 NOTE — ED Notes (Signed)
Patient still sleeping

## 2020-05-11 NOTE — ED Notes (Signed)
Patient appears to be resting with eyes closed- no distress noted when awakened

## 2020-05-11 NOTE — Progress Notes (Signed)
TOC CSW spoke with [facility] [phone].  Archie Patten will review pt and update CSW soon.  Yakir Wenke Tarpley-Carter, MSW, LCSW-A Pronouns:  She, Her, Hers                  [facility] Transitions of CareClinical Social Worker [email or web address] [phone]

## 2020-05-11 NOTE — ED Notes (Signed)
Patient appears to be sleeping when checked frequently.  Siderails up x 2.  No distress noted

## 2020-05-11 NOTE — Progress Notes (Signed)
TOC CSW spoke with [facility].  Pt is still under review.  Pt is currently in co-pay days.  [name]/pts daughter is currently awaiting a call back from [place] about the status.  CSW will continue to follow for dc needs.  [name], MSW, LCSW-A Pronouns:  She, Her, Hers                  [name] ED Transitions of CareClinical Social Worker [name] [phone]

## 2020-05-11 NOTE — ED Notes (Signed)
No changes noted when checked frequently and given AM medications

## 2020-05-11 NOTE — ED Notes (Signed)
Provided with lunch tray, sat patient up on the edge of the bed and prepared meal tray.

## 2020-05-12 DIAGNOSIS — M2550 Pain in unspecified joint: Secondary | ICD-10-CM | POA: Diagnosis not present

## 2020-05-12 DIAGNOSIS — G309 Alzheimer's disease, unspecified: Secondary | ICD-10-CM | POA: Diagnosis not present

## 2020-05-12 DIAGNOSIS — M6281 Muscle weakness (generalized): Secondary | ICD-10-CM | POA: Diagnosis not present

## 2020-05-12 DIAGNOSIS — I130 Hypertensive heart and chronic kidney disease with heart failure and stage 1 through stage 4 chronic kidney disease, or unspecified chronic kidney disease: Secondary | ICD-10-CM | POA: Diagnosis not present

## 2020-05-12 DIAGNOSIS — I25119 Atherosclerotic heart disease of native coronary artery with unspecified angina pectoris: Secondary | ICD-10-CM

## 2020-05-12 DIAGNOSIS — G4089 Other seizures: Secondary | ICD-10-CM | POA: Diagnosis not present

## 2020-05-12 DIAGNOSIS — I10 Essential (primary) hypertension: Secondary | ICD-10-CM | POA: Diagnosis not present

## 2020-05-12 DIAGNOSIS — D649 Anemia, unspecified: Secondary | ICD-10-CM | POA: Diagnosis not present

## 2020-05-12 DIAGNOSIS — Z7401 Bed confinement status: Secondary | ICD-10-CM | POA: Diagnosis not present

## 2020-05-12 DIAGNOSIS — I5041 Acute combined systolic (congestive) and diastolic (congestive) heart failure: Secondary | ICD-10-CM | POA: Diagnosis not present

## 2020-05-12 DIAGNOSIS — S7292XD Unspecified fracture of left femur, subsequent encounter for closed fracture with routine healing: Secondary | ICD-10-CM | POA: Diagnosis not present

## 2020-05-12 DIAGNOSIS — R531 Weakness: Secondary | ICD-10-CM | POA: Diagnosis not present

## 2020-05-12 DIAGNOSIS — Z96642 Presence of left artificial hip joint: Secondary | ICD-10-CM | POA: Diagnosis not present

## 2020-05-12 DIAGNOSIS — M109 Gout, unspecified: Secondary | ICD-10-CM | POA: Diagnosis not present

## 2020-05-12 DIAGNOSIS — I25111 Atherosclerotic heart disease of native coronary artery with angina pectoris with documented spasm: Secondary | ICD-10-CM | POA: Diagnosis not present

## 2020-05-12 DIAGNOSIS — N1832 Chronic kidney disease, stage 3b: Secondary | ICD-10-CM | POA: Diagnosis not present

## 2020-05-12 DIAGNOSIS — F0390 Unspecified dementia without behavioral disturbance: Secondary | ICD-10-CM | POA: Diagnosis not present

## 2020-05-12 DIAGNOSIS — E43 Unspecified severe protein-calorie malnutrition: Secondary | ICD-10-CM | POA: Diagnosis not present

## 2020-05-12 DIAGNOSIS — Z20822 Contact with and (suspected) exposure to covid-19: Secondary | ICD-10-CM | POA: Diagnosis not present

## 2020-05-12 DIAGNOSIS — F17210 Nicotine dependence, cigarettes, uncomplicated: Secondary | ICD-10-CM | POA: Diagnosis not present

## 2020-05-12 DIAGNOSIS — R5381 Other malaise: Secondary | ICD-10-CM | POA: Diagnosis not present

## 2020-05-12 DIAGNOSIS — R569 Unspecified convulsions: Secondary | ICD-10-CM | POA: Diagnosis not present

## 2020-05-12 DIAGNOSIS — R410 Disorientation, unspecified: Secondary | ICD-10-CM | POA: Diagnosis not present

## 2020-05-12 DIAGNOSIS — Z7189 Other specified counseling: Secondary | ICD-10-CM | POA: Diagnosis not present

## 2020-05-12 NOTE — Progress Notes (Signed)
TOC CSW spoke with [facility].  Pt has been accepted into SNF. CSW spoke with pts daughter/Latoya [phone].  CSW informed Latoya that pt would be in SNF, but will not be considered for LTC until Medicaid is approved.  Medicaid Social Worker is Ms. Robinson [phone].  Kasi Lasky Tarpley-Carter, MSW, LCSW-A Pronouns:  She, Her, Hers                  [facility] Transitions of CareClinical Social Worker [email or web address] [phone]

## 2020-05-12 NOTE — Progress Notes (Signed)
TOC CSW spoke with [facility] [employer].  Archie Patten is currently reviewing pt for acceptance.  CSW will continue to follow for dc needs.  Ellawyn Wogan Tarpley-Carter, MSW, LCSW-A Pronouns:  She, Her, Hers                  [facility] Transitions of CareClinical Social Worker [email or web address] [phone]

## 2020-05-12 NOTE — ED Notes (Signed)
Report called to Victorino Dike, Charity fundraiser at [facility]

## 2020-05-12 NOTE — Progress Notes (Signed)
TOC CSW obtained admit information from [facility] [employer].  Room #:  25B  Call Report#:  [phone]  CSW will continue to follow for dc needs.  Angelly Spearing Tarpley-Carter, MSW, LCSW-A Pronouns:  She, Her, Hers                  [facility] Transitions of CareClinical Social Worker [email or web address] [phone]

## 2020-05-12 NOTE — ED Notes (Signed)
Fall alarms in place and activated.  

## 2020-05-12 NOTE — ED Notes (Signed)
[facility] called for transport.  

## 2020-05-12 NOTE — Progress Notes (Signed)
TOC CSW faxed [facility] information requested.  [name] is currently reviewing pt for acceptance.  CSW will continue to follow for dc needs.  Malikah Lakey Tarpley-Carter, MSW, LCSW-A Pronouns:  She, Her, Hers                  [facility] Transitions of CareClinical Social Worker [email or web address] [phone]

## 2020-05-14 DIAGNOSIS — N1832 Chronic kidney disease, stage 3b: Secondary | ICD-10-CM | POA: Diagnosis not present

## 2020-05-14 DIAGNOSIS — R531 Weakness: Secondary | ICD-10-CM | POA: Diagnosis not present

## 2020-05-14 DIAGNOSIS — R569 Unspecified convulsions: Secondary | ICD-10-CM | POA: Diagnosis not present

## 2020-05-14 DIAGNOSIS — F0390 Unspecified dementia without behavioral disturbance: Secondary | ICD-10-CM | POA: Diagnosis not present

## 2020-05-22 DIAGNOSIS — R6884 Jaw pain: Secondary | ICD-10-CM | POA: Diagnosis not present

## 2020-05-22 DIAGNOSIS — G501 Atypical facial pain: Secondary | ICD-10-CM | POA: Diagnosis not present

## 2020-05-22 DIAGNOSIS — W19XXXA Unspecified fall, initial encounter: Secondary | ICD-10-CM | POA: Diagnosis not present

## 2020-05-25 ENCOUNTER — Emergency Department
Admission: EM | Admit: 2020-05-25 | Discharge: 2020-05-25 | Disposition: A | Discharge status: Skilled Nursing Facility | Payer: Medicare HMO | Attending: Emergency Medicine | Admitting: Emergency Medicine

## 2020-05-25 ENCOUNTER — Encounter: Payer: Self-pay | Admitting: Emergency Medicine

## 2020-05-25 ENCOUNTER — Emergency Department: Payer: Medicare HMO

## 2020-05-25 DIAGNOSIS — R0902 Hypoxemia: Secondary | ICD-10-CM | POA: Diagnosis not present

## 2020-05-25 DIAGNOSIS — G309 Alzheimer's disease, unspecified: Secondary | ICD-10-CM | POA: Insufficient documentation

## 2020-05-25 DIAGNOSIS — M25572 Pain in left ankle and joints of left foot: Secondary | ICD-10-CM | POA: Diagnosis not present

## 2020-05-25 DIAGNOSIS — F17210 Nicotine dependence, cigarettes, uncomplicated: Secondary | ICD-10-CM | POA: Diagnosis not present

## 2020-05-25 DIAGNOSIS — S72142D Displaced intertrochanteric fracture of left femur, subsequent encounter for closed fracture with routine healing: Secondary | ICD-10-CM | POA: Diagnosis not present

## 2020-05-25 DIAGNOSIS — F0390 Unspecified dementia without behavioral disturbance: Secondary | ICD-10-CM | POA: Diagnosis not present

## 2020-05-25 DIAGNOSIS — N1832 Chronic kidney disease, stage 3b: Secondary | ICD-10-CM | POA: Insufficient documentation

## 2020-05-25 DIAGNOSIS — Z96642 Presence of left artificial hip joint: Secondary | ICD-10-CM | POA: Diagnosis not present

## 2020-05-25 DIAGNOSIS — W06XXXA Fall from bed, initial encounter: Secondary | ICD-10-CM | POA: Diagnosis not present

## 2020-05-25 DIAGNOSIS — S79919A Unspecified injury of unspecified hip, initial encounter: Secondary | ICD-10-CM | POA: Insufficient documentation

## 2020-05-25 DIAGNOSIS — Z79899 Other long term (current) drug therapy: Secondary | ICD-10-CM | POA: Insufficient documentation

## 2020-05-25 DIAGNOSIS — W19XXXA Unspecified fall, initial encounter: Secondary | ICD-10-CM

## 2020-05-25 DIAGNOSIS — I130 Hypertensive heart and chronic kidney disease with heart failure and stage 1 through stage 4 chronic kidney disease, or unspecified chronic kidney disease: Secondary | ICD-10-CM | POA: Diagnosis not present

## 2020-05-25 DIAGNOSIS — Z7401 Bed confinement status: Secondary | ICD-10-CM | POA: Diagnosis not present

## 2020-05-25 DIAGNOSIS — M2550 Pain in unspecified joint: Secondary | ICD-10-CM | POA: Diagnosis not present

## 2020-05-25 DIAGNOSIS — Z7982 Long term (current) use of aspirin: Secondary | ICD-10-CM | POA: Insufficient documentation

## 2020-05-25 DIAGNOSIS — M25551 Pain in right hip: Secondary | ICD-10-CM | POA: Diagnosis not present

## 2020-05-25 DIAGNOSIS — I2510 Atherosclerotic heart disease of native coronary artery without angina pectoris: Secondary | ICD-10-CM | POA: Insufficient documentation

## 2020-05-25 DIAGNOSIS — I10 Essential (primary) hypertension: Secondary | ICD-10-CM | POA: Diagnosis not present

## 2020-05-25 DIAGNOSIS — I5043 Acute on chronic combined systolic (congestive) and diastolic (congestive) heart failure: Secondary | ICD-10-CM | POA: Insufficient documentation

## 2020-05-25 DIAGNOSIS — R5381 Other malaise: Secondary | ICD-10-CM | POA: Diagnosis not present

## 2020-05-25 DIAGNOSIS — J189 Pneumonia, unspecified organism: Secondary | ICD-10-CM | POA: Diagnosis not present

## 2020-05-25 NOTE — ED Triage Notes (Signed)
Pt arrived via [facility] from [facility] where she is in the memory care unit and staff found her beside of her bed, on floor. Pt has previous bruising and abrasion noted under chin that staff reported was from previous time, not today. Pt not c/o of pain or grimacing when palpated over hips, arms, legs, shoulders and tail bone area. EMS reports that when moving pt over from stretcher that when pts right hip touched bed, she grimaced.   Report at bedside from nursing facility.

## 2020-05-25 NOTE — ED Provider Notes (Signed)
[facility] Emergency Department Provider Note   ____________________________________________    I have reviewed the triage vital signs and the nursing notes.   HISTORY  Chief Complaint Fall   History limited, patient from memory care unit, unable to provide history  HPI [name] is a 80 y.o. female with history as noted below who was apparently found next to her bed at memory care unit.  Patient does not ambulate at baseline.  There is concerned that she may have injured her hip  Past Medical History:  Diagnosis Date  . Abdominal pain 02/24/2013  . Acute combined systolic and diastolic congestive heart failure (HCC) 02/24/2013  . Acute on chronic renal insufficiency 05/15/2015  . Acute renal failure (HCC) 12/28/2012  . Acute renal failure superimposed on stage 3 chronic kidney disease (HCC)   . Anemia 05/20/2015  . 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 Basal ganglia infarction (HCC) 01/08/2013  . CAP (community acquired pneumonia)   . Chest pain 05/14/2015  . Chronic combined systolic and diastolic CHF (congestive heart failure) (HCC) 03/06/2014  . Congestive heart failure (CHF) (HCC)   . Constipation 02/24/2013  . Cough   . Diastolic dysfunction-grade 3 05/20/2015  . Dyspnea 02/24/2013  . Encephalopathy 12/28/2012  . Epilepsy (HCC)   . Generalized seizure (HCC) 12/28/2012  . H/O: CVA (cerebrovascular accident) 01/04/2013  . Hyperammonemia (HCC) 05/17/2015  . Hyperglycemia 02/24/2013  . Hyperkalemia 02/11/2013  . Hypertension   . 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 Hyponatremia 02/11/2013  . Influenza A 02/11/2013  . Liver enzyme elevation 02/11/2013  . Metabolic acidosis 12/28/2012  . Metabolic encephalopathy 05/16/2015  . NICM (nonischemic cardiomyopathy) -EF 25% 05/20/2015  . Pleuritic chest pain 02/24/2013  . PNA (pneumonia) 05/20/2015  . Pneumobilia 02/12/2013  . Pneumonia   . Seizure (HCC)  12/28/2012  . Seizures (HCC)   . Smoker   . Stroke (HCC)   . Troponin level elevated 05/14/2015  . Volume overload 02/24/2013  . Weight gain 02/24/2013    Patient Active Problem List   Diagnosis Date Noted  . Coronary artery disease involving native coronary artery of native heart with angina pectoris (HCC) 05/12/2020  . Closed fracture of left hip (HCC) 03/27/2020  . SIRS (systemic inflammatory response syndrome) (HCC) 03/27/2020  . Intertrochanteric fracture of right femur (HCC) 01/04/2020  . Humeral head fracture 01/04/2020  . Humeral surgical neck fracture 01/04/2020  . Witnessed seizure-like activity (HCC) 12/27/2019  . Chronic kidney disease, stage 3b (HCC) 12/27/2019  . Alzheimer's dementia without behavioral disturbance (HCC) 12/27/2019  . Gout 12/27/2019  . Leukocytosis 12/27/2019  . Cough   . Encounter for palliative care   . Goals of care, counseling/discussion   . Pneumonia   . NICM (nonischemic cardiomyopathy) -EF 25% 05/20/2015  . No significant CAD at cath May 2012 05/20/2015  . Anemia 05/20/2015  . Diastolic dysfunction-grade 3 05/20/2015  . Acute renal failure superimposed on stage 3 chronic kidney disease (HCC)   . CAP (community acquired pneumonia)   . Hyperammonemia (HCC) 05/17/2015  . Metabolic encephalopathy 05/16/2015  . Acute on chronic renal insufficiency 05/15/2015  . Chest pain 05/14/2015  . Troponin level elevated 05/14/2015  . Chronic combined systolic and diastolic congestive heart failure (HCC) 03/06/2014  . Acute combined systolic and diastolic congestive heart failure (HCC) 02/24/2013  . Weight gain 02/24/2013  . Volume overload 02/24/2013  . Hyperglycemia 02/24/2013  . Dyspnea 02/24/2013  . Constipation 02/24/2013  .  Abdominal pain 02/24/2013  . Pleuritic chest pain 02/24/2013  . Epilepsy (HCC)   . Smoker   . 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 Protein-calorie malnutrition, severe- wgt 86 lbs 02/13/2013  .  Pneumobilia 02/12/2013  . Influenza A 02/11/2013  . Hyperkalemia 02/11/2013  . Hyponatremia 02/11/2013  . HCAP (healthcare-associated pneumonia) 02/11/2013  . Liver enzyme elevation 02/11/2013  . Basal ganglia infarction (HCC) 01/08/2013  . H/O: CVA (cerebrovascular accident) 01/04/2013  . Generalized seizure (HCC) 12/28/2012  . 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 Seizure disorder (HCC) 12/28/2012  . Metabolic acidosis 12/28/2012  . Encephalopathy 12/28/2012  . Acute renal failure (HCC) 12/28/2012    Past Surgical History:  Procedure Laterality Date  . FEMUR IM NAIL Right 01/05/2020  . INTRAMEDULLARY (IM) NAIL INTERTROCHANTERIC Right 01/05/2020   Procedure: INTRAMEDULLARY (IM) NAIL INTERTROCHANTRIC;  Surgeon: [name], MD;  Location: [facility] OR;  Service: Orthopedics;  Laterality: Right;  . NO PAST SURGERIES    . TOTAL HIP ARTHROPLASTY Left 03/29/2020   Procedure: TOTAL HIP ARTHROPLASTY ANTERIOR APPROACH;  Surgeon: Samson Frederic, MD;  Location: [facility] ORS;  Service: Orthopedics;  Laterality: Left;    Prior to Admission medications   Medication Sig Start Date End Date Taking? Authorizing Provider  acetaminophen (TYLENOL) 325 MG tablet Take 650 mg by mouth every 6 (six) hours as needed.    [provider]  allopurinol (ZYLOPRIM) 100 MG tablet Take 1 tablet (100 mg total) by mouth daily. 04/17/20   Fargo, Amy E, NP  amLODipine (NORVASC) 10 MG tablet Take 1 tablet (10 mg total) by mouth daily. 04/17/20   Octavia Heir, NP  aspirin (ASPIRIN CHILDRENS) 81 MG chewable tablet Chew 1 tablet (81 mg total) by mouth 2 (two) times daily with a meal. 04/17/20 06/01/20  Fargo, Amy E, NP  carvedilol (COREG) 12.5 MG tablet Take 1 tablet (12.5 mg total) by mouth 2 (two) times daily with a meal. 04/17/20   Fargo, Amy E, NP  docusate sodium (COLACE) 100 MG capsule Take 1 capsule (100 mg total) by mouth 2 (two) times daily. 01/09/20   Pahwani, Kasandra Knudsen, MD  donepezil (ARICEPT) 5 MG tablet  Take 1 tablet (5 mg total) by mouth daily. 04/17/20   Fargo, Amy E, NP  Ensure (ENSURE) Take 237 mLs by mouth 3 (three) times daily between meals.    [provider]  FEROSUL 325 (65 Fe) MG tablet Take 1 tablet (325 mg total) by mouth every morning. 04/17/20   Fargo, Amy E, NP  lacosamide (VIMPAT) 200 MG TABS tablet Take 1 tablet (200 mg total) by mouth 2 (two) times daily. 04/17/20   Fargo, Amy E, NP  levETIRAcetam (KEPPRA) 1000 MG tablet Take 1 tablet (1,000 mg total) by mouth 2 (two) times daily. 04/17/20   Fargo, Amy E, NP  losartan (COZAAR) 100 MG tablet Take 1 tablet (100 mg total) by mouth daily. 04/17/20   Octavia Heir, NP  Multiple Vitamin (MULTIVITAMIN WITH MINERALS) TABS tablet Take 1 tablet by mouth daily. Patient not taking: No sig reported 01/10/20   Pahwani, Rinka R, MD  polyethylene glycol (MIRALAX / GLYCOLAX) 17 g packet Take 17 g by mouth daily as needed for mild constipation. Patient not taking: No sig reported 01/09/20   Pahwani, Kasandra Knudsen, MD     Allergies Banana, Budesonide-formoterol fumarate, Fish allergy, Lisinopril, Mirtazapine, and Sodium bicarbonate  Family History  Problem Relation Age of Onset  . Cancer Father   .  Anemia Daughter     Social History Social History   Tobacco Use  . Smoking status: Current Some Day Smoker    Packs/day: 0.25    Types: Cigarettes  . Smokeless tobacco: Never Used  . Tobacco comment: 1/2 caigarettes a day  Vaping Use  . Vaping Use: Never used  Substance Use Topics  . Alcohol use: Yes    Alcohol/week: 0.0 standard drinks    Comment: occasional  . Drug use: No    Unable to obtain review of Systems     ____________________________________________   PHYSICAL EXAM:  VITAL SIGNS: ED Triage Vitals  Enc Vitals Group     BP      Pulse      Resp      Temp      Temp src      SpO2      Weight      Height      Head Circumference      Peak Flow      Pain Score      Pain Loc      Pain Edu?      Excl. in GC?      Constitutional: Alert Eyes: Conjunctivae are normal.  Head: Atraumatic. Nose: No swelling or epistaxis  Neck:  Painless ROM, no pain with axial load, no vertebral tenderness palpation Cardiovascular: Normal rate, regular rhythm. Grossly normal heart sounds.  Good peripheral circulation.  No chest wall tenderness palpation Respiratory: Normal respiratory effort.  No retractions.  Gastrointestinal: Soft and nontender. No distention.  No CVA tenderness.  Musculoskeletal: No significant pain with axial load on both hips, hips are kept flexed, this is apparently chronic for the patient, warm and well perfused, normal range of motion of the upper extremities, no vertebral tenderness to palpation Neurologic: Patient is apparently at baseline per Skin:  Skin is warm, dry and intact.    ____________________________________________   LABS (all labs ordered are listed, but only abnormal results are displayed)  Labs Reviewed - No data to display ____________________________________________  EKG  None ____________________________________________  RADIOLOGY  Pelvis x-ray reviewed by me ____________________________________________   PROCEDURES  Procedure(s) performed: No  Procedures   Critical Care performed: No ____________________________________________   INITIAL IMPRESSION / ASSESSMENT AND PLAN / ED COURSE  Pertinent labs & imaging results that were available during my care of the patient were reviewed by me and considered in my medical decision making (see chart for details).  Patient presents after fall from bed, overall she is well-appearing and in no acute distress.  Exam is reassuring, will obtain pelvis x-ray given concerns expressed by memory care home staff  X-ray is reassuring, appropriate discharge at this time with close follow-up with PCP    ____________________________________________   FINAL CLINICAL IMPRESSION(S) / ED DIAGNOSES  Final diagnoses:   Fall, initial encounter        Note:  This document was prepared using Dragon voice recognition software and may include unintentional dictation errors.   Jene Every, MD 05/25/20 [id number]

## 2020-05-25 NOTE — ED Notes (Signed)
Report provided back to facility with education and follow up care that will be noted within discharge papers. Writer spoke with Leavy Cella, Charity fundraiser at [facility].

## 2020-05-25 NOTE — Discharge Instructions (Signed)
X-rays of the pelvis and exam are reassuring

## 2020-07-08 ENCOUNTER — Other Ambulatory Visit: Payer: Self-pay

## 2020-07-08 ENCOUNTER — Emergency Department: Payer: Medicare HMO

## 2020-07-08 ENCOUNTER — Emergency Department
Admission: EM | Admit: 2020-07-08 | Discharge: 2020-07-09 | Disposition: A | Discharge status: Home / Self Care | Payer: Medicare HMO | Attending: Emergency Medicine | Admitting: Emergency Medicine

## 2020-07-08 ENCOUNTER — Encounter: Payer: Self-pay | Admitting: Emergency Medicine

## 2020-07-08 DIAGNOSIS — F0280 Dementia in other diseases classified elsewhere without behavioral disturbance: Secondary | ICD-10-CM | POA: Diagnosis not present

## 2020-07-08 DIAGNOSIS — N1832 Chronic kidney disease, stage 3b: Secondary | ICD-10-CM | POA: Insufficient documentation

## 2020-07-08 DIAGNOSIS — S199XXA Unspecified injury of neck, initial encounter: Secondary | ICD-10-CM | POA: Diagnosis not present

## 2020-07-08 DIAGNOSIS — G309 Alzheimer's disease, unspecified: Secondary | ICD-10-CM | POA: Diagnosis not present

## 2020-07-08 DIAGNOSIS — I25118 Atherosclerotic heart disease of native coronary artery with other forms of angina pectoris: Secondary | ICD-10-CM | POA: Insufficient documentation

## 2020-07-08 DIAGNOSIS — S0990XA Unspecified injury of head, initial encounter: Secondary | ICD-10-CM | POA: Diagnosis not present

## 2020-07-08 DIAGNOSIS — F17210 Nicotine dependence, cigarettes, uncomplicated: Secondary | ICD-10-CM | POA: Diagnosis not present

## 2020-07-08 DIAGNOSIS — Z79899 Other long term (current) drug therapy: Secondary | ICD-10-CM | POA: Diagnosis not present

## 2020-07-08 DIAGNOSIS — W06XXXA Fall from bed, initial encounter: Secondary | ICD-10-CM | POA: Diagnosis not present

## 2020-07-08 DIAGNOSIS — W19XXXA Unspecified fall, initial encounter: Secondary | ICD-10-CM | POA: Diagnosis not present

## 2020-07-08 DIAGNOSIS — I5043 Acute on chronic combined systolic (congestive) and diastolic (congestive) heart failure: Secondary | ICD-10-CM | POA: Diagnosis not present

## 2020-07-08 DIAGNOSIS — I130 Hypertensive heart and chronic kidney disease with heart failure and stage 1 through stage 4 chronic kidney disease, or unspecified chronic kidney disease: Secondary | ICD-10-CM | POA: Insufficient documentation

## 2020-07-08 DIAGNOSIS — R404 Transient alteration of awareness: Secondary | ICD-10-CM | POA: Diagnosis not present

## 2020-07-08 DIAGNOSIS — M4802 Spinal stenosis, cervical region: Secondary | ICD-10-CM | POA: Diagnosis not present

## 2020-07-08 DIAGNOSIS — I10 Essential (primary) hypertension: Secondary | ICD-10-CM | POA: Diagnosis not present

## 2020-07-08 NOTE — ED Provider Notes (Signed)
[facility] Emergency Department Provider Note  ____________________________________________  Time seen: Approximately 11:20 PM  I have reviewed the triage vital signs and the nursing notes.   HISTORY  Chief Complaint Fall    HPI [name] is a 80 y.o. female who presents the emergency department for evaluation after falling out of her bed.  Patient states that she was reaching for her phone, rolled out of the bed and hit  her head.  Patient did not lose consciousness.  She denies any headache or neck pain at this time.  She does have a history of dementia but is able to recall the events at this time.  She does take daily aspirin but does not take any blood thinner.  Patient has a history of chronic kidney disease, anemia, CHF, epilepsy, hyponatremia, hypertension, CVA.  No complaints with chronic medical issues at this time.        Past Medical History:  Diagnosis Date  . Abdominal pain 02/24/2013  . Acute combined systolic and diastolic congestive heart failure (HCC) 02/24/2013  . Acute on chronic renal insufficiency 05/15/2015  . Acute renal failure (HCC) 12/28/2012  . Acute renal failure superimposed on stage 3 chronic kidney disease (HCC)   . Anemia 05/20/2015  . 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 Basal ganglia infarction (HCC) 01/08/2013  . CAP (community acquired pneumonia)   . Chest pain 05/14/2015  . Chronic combined systolic and diastolic CHF (congestive heart failure) (HCC) 03/06/2014  . Congestive heart failure (CHF) (HCC)   . Constipation 02/24/2013  . Cough   . Diastolic dysfunction-grade 3 05/20/2015  . Dyspnea 02/24/2013  . Encephalopathy 12/28/2012  . Epilepsy (HCC)   . Generalized seizure (HCC) 12/28/2012  . H/O: CVA (cerebrovascular accident) 01/04/2013  . Hyperammonemia (HCC) 05/17/2015  . Hyperglycemia 02/24/2013  . Hyperkalemia 02/11/2013  . Hypertension   . Hypertensive heart and kidney disease  12/28/2012  . Hyponatremia 02/11/2013  . Influenza A 02/11/2013  . Liver enzyme elevation 02/11/2013  . Metabolic acidosis 12/28/2012  . Metabolic encephalopathy 05/16/2015  . NICM (nonischemic cardiomyopathy) -EF 25% 05/20/2015  . Pleuritic chest pain 02/24/2013  . PNA (pneumonia) 05/20/2015  . Pneumobilia 02/12/2013  . Pneumonia   . Seizure (HCC) 12/28/2012  . Seizures (HCC)   . Smoker   . Stroke (HCC)   . Troponin level elevated 05/14/2015  . Volume overload 02/24/2013  . Weight gain 02/24/2013    Patient Active Problem List   Diagnosis Date Noted  . Coronary artery disease involving native coronary artery of native heart with angina pectoris (HCC) 05/12/2020  . Closed fracture of left hip (HCC) 03/27/2020  . SIRS (systemic inflammatory response syndrome) (HCC) 03/27/2020  . Intertrochanteric fracture of right femur (HCC) 01/04/2020  . Humeral head fracture 01/04/2020  . Humeral surgical neck fracture 01/04/2020  . Witnessed seizure-like activity (HCC) 12/27/2019  . Chronic kidney disease, stage 3b (HCC) 12/27/2019  . Alzheimer's dementia without behavioral disturbance (HCC) 12/27/2019  . Gout 12/27/2019  . Leukocytosis 12/27/2019  . Cough   . Encounter for palliative care   . Goals of care, counseling/discussion   . Pneumonia   . NICM (nonischemic cardiomyopathy) -EF 25% 05/20/2015  . No significant CAD at cath May 2012 05/20/2015  . Anemia 05/20/2015  . Diastolic dysfunction-grade 3 05/20/2015  . Acute renal failure superimposed on stage 3 chronic kidney disease (HCC)   . CAP (community acquired pneumonia)   . Hyperammonemia (HCC) 05/17/2015  . Metabolic encephalopathy  05/16/2015  . Acute on chronic renal insufficiency 05/15/2015  . Chest pain 05/14/2015  . Troponin level elevated 05/14/2015  . Chronic combined systolic and diastolic congestive heart failure (HCC) 03/06/2014  . Acute combined systolic and diastolic congestive heart failure (HCC) 02/24/2013  . Weight gain  02/24/2013  . Volume overload 02/24/2013  . Hyperglycemia 02/24/2013  . Dyspnea 02/24/2013  . Constipation 02/24/2013  . Abdominal pain 02/24/2013  . Pleuritic chest pain 02/24/2013  . Epilepsy (HCC)   . Smoker   . 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 Protein-calorie malnutrition, severe- wgt 86 lbs 02/13/2013  . Pneumobilia 02/12/2013  . Influenza A 02/11/2013  . Hyperkalemia 02/11/2013  . Hyponatremia 02/11/2013  . HCAP (healthcare-associated pneumonia) 02/11/2013  . Liver enzyme elevation 02/11/2013  . Basal ganglia infarction (HCC) 01/08/2013  . H/O: CVA (cerebrovascular accident) 01/04/2013  . Generalized seizure (HCC) 12/28/2012  . 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 Seizure disorder (HCC) 12/28/2012  . Metabolic acidosis 12/28/2012  . Encephalopathy 12/28/2012  . Acute renal failure (HCC) 12/28/2012    Past Surgical History:  Procedure Laterality Date  . FEMUR IM NAIL Right 01/05/2020  . INTRAMEDULLARY (IM) NAIL INTERTROCHANTERIC Right 01/05/2020   Procedure: INTRAMEDULLARY (IM) NAIL INTERTROCHANTRIC;  Surgeon: [name], MD;  Location: [facility] OR;  Service: Orthopedics;  Laterality: Right;  . NO PAST SURGERIES    . TOTAL HIP ARTHROPLASTY Left 03/29/2020   Procedure: TOTAL HIP ARTHROPLASTY ANTERIOR APPROACH;  Surgeon: Samson Frederic, MD;  Location: [facility] ORS;  Service: Orthopedics;  Laterality: Left;    Prior to Admission medications   Medication Sig Start Date End Date Taking? Authorizing Provider  acetaminophen (TYLENOL) 325 MG tablet Take 650 mg by mouth every 6 (six) hours as needed.    [provider]  allopurinol (ZYLOPRIM) 100 MG tablet Take 1 tablet (100 mg total) by mouth daily. 04/17/20   Fargo, Amy E, NP  amLODipine (NORVASC) 10 MG tablet Take 1 tablet (10 mg total) by mouth daily. 04/17/20   Fargo, Amy E, NP  carvedilol (COREG) 12.5 MG tablet Take 1 tablet (12.5 mg total) by mouth 2 (two) times daily with a meal.  04/17/20   Fargo, Amy E, NP  docusate sodium (COLACE) 100 MG capsule Take 1 capsule (100 mg total) by mouth 2 (two) times daily. 01/09/20   Pahwani, Kasandra Knudsen, MD  donepezil (ARICEPT) 5 MG tablet Take 1 tablet (5 mg total) by mouth daily. 04/17/20   Fargo, Amy E, NP  Ensure (ENSURE) Take 237 mLs by mouth 3 (three) times daily between meals.    [provider]  FEROSUL 325 (65 Fe) MG tablet Take 1 tablet (325 mg total) by mouth every morning. 04/17/20   Fargo, Amy E, NP  lacosamide (VIMPAT) 200 MG TABS tablet Take 1 tablet (200 mg total) by mouth 2 (two) times daily. 04/17/20   Fargo, Amy E, NP  levETIRAcetam (KEPPRA) 1000 MG tablet Take 1 tablet (1,000 mg total) by mouth 2 (two) times daily. 04/17/20   Fargo, Amy E, NP  losartan (COZAAR) 100 MG tablet Take 1 tablet (100 mg total) by mouth daily. 04/17/20   Octavia Heir, NP  Multiple Vitamin (MULTIVITAMIN WITH MINERALS) TABS tablet Take 1 tablet by mouth daily. Patient not taking: No sig reported 01/10/20   Pahwani, Rinka R, MD  polyethylene glycol (MIRALAX / GLYCOLAX) 17 g packet Take 17 g by mouth daily as needed for mild constipation. Patient not taking: No sig  reported 01/09/20   Ollen BowlPahwani, Rinka R, MD    Allergies Banana, Budesonide-formoterol fumarate, Fish allergy, Lisinopril, Mirtazapine, and Sodium bicarbonate  Family History  Problem Relation Age of Onset  . Cancer Father   . Anemia Daughter     Social History Social History   Tobacco Use  . Smoking status: Current Some Day Smoker    Packs/day: 0.25    Types: Cigarettes  . Smokeless tobacco: Never Used  . Tobacco comment: 1/2 caigarettes a day  Vaping Use  . Vaping Use: Never used  Substance Use Topics  . Alcohol use: Yes    Alcohol/week: 0.0 standard drinks    Comment: occasional  . Drug use: No     Review of Systems  Constitutional: No fever/chills.  Patient fell and hit her head, currently no complaints. Eyes: No visual changes. No discharge ENT: No upper  respiratory complaints. Cardiovascular: no chest pain. Respiratory: no cough. No SOB. Gastrointestinal: No abdominal pain.  No nausea, no vomiting.  No diarrhea.  No constipation. Musculoskeletal: Negative for musculoskeletal pain. Skin: Negative for rash, abrasions, lacerations, ecchymosis. Neurological: Negative for headaches, focal weakness or numbness.  10 System ROS otherwise negative.  ____________________________________________   PHYSICAL EXAM:  VITAL SIGNS: ED Triage Vitals  Enc Vitals Group     BP 07/08/20 2015 (!) 162/96     Pulse Rate 07/08/20 2015 97     Resp 07/08/20 2015 18     Temp 07/08/20 2015 98.6 F (37 C)     Temp Source 07/08/20 2015 Oral     SpO2 07/08/20 2015 94 %     Weight 07/08/20 2016 98 lb (44.5 kg)     Height 07/08/20 2016 5' (1.524 m)     Head Circumference --      Peak Flow --      Pain Score --      Pain Loc --      Pain Edu? --      Excl. in GC? --      Constitutional: Alert and oriented. Well appearing and in no acute distress. Eyes: Conjunctivae are normal. PERRL. EOMI. Head: Atraumatic.  No visible signs of trauma laceration, abrasions, ecchymosis, hematoma.  Nontender to palpation of the osseous structures of the skull and face.  No palpable abnormalities or crepitus on palpation.  No battle signs, raccoon eyes, serosanguineous fluid drainage from the ears or nares. ENT:      Ears:       Nose: No congestion/rhinnorhea.      Mouth/Throat: Mucous membranes are moist.  Neck: No stridor.   Nontender to palpation along the cervical spine.  No palpable abnormalities.   Cardiovascular: Normal rate, regular rhythm. Normal S1 and S2.  Good peripheral circulation. Respiratory: Normal respiratory effort without tachypnea or retractions. Lungs CTAB. Good air entry to the bases with no decreased or absent breath sounds. Musculoskeletal: Full range of motion to all extremities. No gross deformities appreciated. Neurologic:  Normal speech and  language. No gross focal neurologic deficits are appreciated.  Skin:  Skin is warm, dry and intact. No rash noted. Psychiatric: Mood and affect are normal. Speech and behavior are normal. Patient exhibits appropriate insight and judgement.   ____________________________________________   LABS (all labs ordered are listed, but only abnormal results are displayed)  Labs Reviewed - No data to display ____________________________________________  EKG   ____________________________________________  RADIOLOGY I personally viewed and evaluated these images as part of my medical decision making, as well as reviewing the written  report by the radiologist.  ED Provider Interpretation: No acute traumatic findings on CT.  Chronic cervical spine findings.  CT Head Wo Contrast  Result Date: 07/08/2020 CLINICAL DATA:  Fall, neck injury, head injury EXAM: CT HEAD WITHOUT CONTRAST CT CERVICAL SPINE WITHOUT CONTRAST TECHNIQUE: Multidetector CT imaging of the head and cervical spine was performed following the standard protocol without intravenous contrast. Multiplanar CT image reconstructions of the cervical spine were also generated. COMPARISON:  03/26/2020 FINDINGS: CT HEAD FINDINGS Brain: Normal anatomic configuration of the brain. Moderate parenchymal volume loss is commensurate with the patient's age and stable since prior examination. Moderate to severe patchy bilateral subcortical and periventricular white matter changes are again seen likely reflecting the sequela of small vessel ischemia. Remote infarcts are again identified within the left parietal cortex, anterior right insular cortex and anterior limb of the right internal capsule, thalami bilaterally and left basal ganglia. No evidence of acute intracranial hemorrhage or infarct. No abnormal mass effect or midline shift. No abnormal intra or extra-axial mass lesion or fluid collection. Ventricular size is normal. The cerebellum is unremarkable.  Vascular: No asymmetric hyperdense vasculature at the skull base. Skull: Intact Sinuses/Orbits: The paranasal sinuses are clear. The orbits are unremarkable. Other: The mastoid air cells and middle ear cavities are clear. CT CERVICAL SPINE FINDINGS Alignment: There is mild cervical kyphosis at C2-C6 likely a combination of degenerative change as well as patient positioning. No listhesis. Skull base and vertebrae: Craniocervical alignment is normal. The atlantodental interval is not widened. There is no acute fracture of the cervical spine. Vertebral body heights have been preserved. Soft tissues and spinal canal: Posterior disc osteophyte complex results in mild central canal stenosis and minimal flattening of the thecal sac at C4-5 with an AP diameter of the canal of approximately 8 mm. Similar changes are noted at C5-6. The spinal canal is otherwise widely patent. No canal hematoma. No prevertebral soft tissue swelling. No paraspinal fluid collections. The cervical soft tissues are unremarkable on this noncontrast examination. Disc levels: There is intervertebral disc space narrowing and endplate remodeling at C2-C6 in keeping with changes of moderate degenerative disc disease, most severe at C4-5 and C5-6. The prevertebral soft tissues are not thickened on sagittal reformats. Review of the axial images demonstrates multilevel uncovertebral arthrosis resulting in mild right and moderate left neuroforaminal narrowing at C4-5, and mild left neuroforaminal narrowing at C5-6. Remaining neural foramina are widely patent. Upper chest: Unremarkable Other: None IMPRESSION: No acute intracranial hemorrhage or infarct.  No calvarial fracture. Stable moderate to severe senescent change and multiple remote infarcts as outlined above. No acute fracture or listhesis of the cervical spine. Multilevel degenerative disc and degenerative joint disease, slightly progressive since prior examination, with mild resultant mild central  canal stenosis and mild-to-moderate left neuroforaminal narrowing at C4-C6 Electronically Signed   By: Helyn Numbers MD   On: 07/08/2020 20:55   CT Cervical Spine Wo Contrast  Result Date: 07/08/2020 CLINICAL DATA:  Fall, neck injury, head injury EXAM: CT HEAD WITHOUT CONTRAST CT CERVICAL SPINE WITHOUT CONTRAST TECHNIQUE: Multidetector CT imaging of the head and cervical spine was performed following the standard protocol without intravenous contrast. Multiplanar CT image reconstructions of the cervical spine were also generated. COMPARISON:  03/26/2020 FINDINGS: CT HEAD FINDINGS Brain: Normal anatomic configuration of the brain. Moderate parenchymal volume loss is commensurate with the patient's age and stable since prior examination. Moderate to severe patchy bilateral subcortical and periventricular white matter changes are again seen likely reflecting the  sequela of small vessel ischemia. Remote infarcts are again identified within the left parietal cortex, anterior right insular cortex and anterior limb of the right internal capsule, thalami bilaterally and left basal ganglia. No evidence of acute intracranial hemorrhage or infarct. No abnormal mass effect or midline shift. No abnormal intra or extra-axial mass lesion or fluid collection. Ventricular size is normal. The cerebellum is unremarkable. Vascular: No asymmetric hyperdense vasculature at the skull base. Skull: Intact Sinuses/Orbits: The paranasal sinuses are clear. The orbits are unremarkable. Other: The mastoid air cells and middle ear cavities are clear. CT CERVICAL SPINE FINDINGS Alignment: There is mild cervical kyphosis at C2-C6 likely a combination of degenerative change as well as patient positioning. No listhesis. Skull base and vertebrae: Craniocervical alignment is normal. The atlantodental interval is not widened. There is no acute fracture of the cervical spine. Vertebral body heights have been preserved. Soft tissues and spinal canal:  Posterior disc osteophyte complex results in mild central canal stenosis and minimal flattening of the thecal sac at C4-5 with an AP diameter of the canal of approximately 8 mm. Similar changes are noted at C5-6. The spinal canal is otherwise widely patent. No canal hematoma. No prevertebral soft tissue swelling. No paraspinal fluid collections. The cervical soft tissues are unremarkable on this noncontrast examination. Disc levels: There is intervertebral disc space narrowing and endplate remodeling at C2-C6 in keeping with changes of moderate degenerative disc disease, most severe at C4-5 and C5-6. The prevertebral soft tissues are not thickened on sagittal reformats. Review of the axial images demonstrates multilevel uncovertebral arthrosis resulting in mild right and moderate left neuroforaminal narrowing at C4-5, and mild left neuroforaminal narrowing at C5-6. Remaining neural foramina are widely patent. Upper chest: Unremarkable Other: None IMPRESSION: No acute intracranial hemorrhage or infarct.  No calvarial fracture. Stable moderate to severe senescent change and multiple remote infarcts as outlined above. No acute fracture or listhesis of the cervical spine. Multilevel degenerative disc and degenerative joint disease, slightly progressive since prior examination, with mild resultant mild central canal stenosis and mild-to-moderate left neuroforaminal narrowing at C4-C6 Electronically Signed   By: Helyn Numbers MD   On: 07/08/2020 20:55    ____________________________________________    PROCEDURES  Procedure(s) performed:    Procedures    Medications - No data to display   ____________________________________________   INITIAL IMPRESSION / ASSESSMENT AND PLAN / ED COURSE  Pertinent labs & imaging results that were available during my care of the patient were reviewed by me and considered in my medical decision making (see chart for details).  Review of the [place] CSRS was performed in  accordance of the [facility] prior to dispensing any controlled drugs.           Patient's diagnosis is consistent with fall, minor head injury.  Patient presented to the emergency department after falling out of bed and hitting her head.  No loss of consciousness.  She denies any headache, visual changes or neck pain.  Imaging is reassuring at this time.  She denied any other musculoskeletal injury or complaint.  No medications prior to arrival.  Exam was reassuring.  Imaging is reassuring at this time.  Patient is stable for discharge..  Follow-up primary care as needed.  Return precautions discussed with the patient.  Patient is given ED precautions to return to the ED for any worsening or new symptoms.     ____________________________________________  FINAL CLINICAL IMPRESSION(S) / ED DIAGNOSES  Final diagnoses:  Fall, initial encounter  Minor  head injury, initial encounter      NEW MEDICATIONS STARTED DURING THIS VISIT:  ED Discharge Orders    None          This chart was dictated using voice recognition software/Dragon. Despite best efforts to proofread, errors can occur which can change the meaning. Any change was purely unintentional.    Racheal Patches, PA-C 07/08/20 [id number]    Chesley Noon, MD 07/09/20 [id number]

## 2020-07-08 NOTE — ED Notes (Signed)
[facility] provided with update on patient status

## 2020-07-08 NOTE — ED Triage Notes (Addendum)
Pt arrived via [facility] from [facility] with reports of a fall 2 hours ago reaching for her phone. Pt reports she fell off the bed and hit forehead on the floor.  Pt denies any pain at this time. Pt has hx of dementia.  Take aspirin daily, hx of seizures

## 2020-07-21 ENCOUNTER — Emergency Department: Payer: Medicare HMO

## 2020-07-21 ENCOUNTER — Observation Stay
Admission: EM | Admit: 2020-07-21 | Discharge: 2020-07-29 | Disposition: A | Discharge status: Home / Self Care | Payer: Medicare HMO | Attending: Internal Medicine | Admitting: Internal Medicine

## 2020-07-21 ENCOUNTER — Other Ambulatory Visit: Payer: Self-pay

## 2020-07-21 DIAGNOSIS — R2689 Other abnormalities of gait and mobility: Secondary | ICD-10-CM | POA: Insufficient documentation

## 2020-07-21 DIAGNOSIS — I2510 Atherosclerotic heart disease of native coronary artery without angina pectoris: Secondary | ICD-10-CM | POA: Insufficient documentation

## 2020-07-21 DIAGNOSIS — W19XXXA Unspecified fall, initial encounter: Secondary | ICD-10-CM | POA: Insufficient documentation

## 2020-07-21 DIAGNOSIS — F17210 Nicotine dependence, cigarettes, uncomplicated: Secondary | ICD-10-CM | POA: Insufficient documentation

## 2020-07-21 DIAGNOSIS — I130 Hypertensive heart and chronic kidney disease with heart failure and stage 1 through stage 4 chronic kidney disease, or unspecified chronic kidney disease: Secondary | ICD-10-CM | POA: Insufficient documentation

## 2020-07-21 DIAGNOSIS — S065X9A Traumatic subdural hemorrhage with loss of consciousness of unspecified duration, initial encounter: Secondary | ICD-10-CM

## 2020-07-21 DIAGNOSIS — I10 Essential (primary) hypertension: Secondary | ICD-10-CM | POA: Diagnosis not present

## 2020-07-21 DIAGNOSIS — Z96642 Presence of left artificial hip joint: Secondary | ICD-10-CM | POA: Diagnosis not present

## 2020-07-21 DIAGNOSIS — Z20822 Contact with and (suspected) exposure to covid-19: Secondary | ICD-10-CM | POA: Diagnosis not present

## 2020-07-21 DIAGNOSIS — S065XAA Traumatic subdural hemorrhage with loss of consciousness status unknown, initial encounter: Secondary | ICD-10-CM | POA: Diagnosis present

## 2020-07-21 DIAGNOSIS — N179 Acute kidney failure, unspecified: Secondary | ICD-10-CM

## 2020-07-21 DIAGNOSIS — I5043 Acute on chronic combined systolic (congestive) and diastolic (congestive) heart failure: Secondary | ICD-10-CM | POA: Insufficient documentation

## 2020-07-21 DIAGNOSIS — N1832 Chronic kidney disease, stage 3b: Secondary | ICD-10-CM | POA: Insufficient documentation

## 2020-07-21 DIAGNOSIS — S065X0A Traumatic subdural hemorrhage without loss of consciousness, initial encounter: Principal | ICD-10-CM | POA: Insufficient documentation

## 2020-07-21 DIAGNOSIS — R404 Transient alteration of awareness: Secondary | ICD-10-CM | POA: Diagnosis not present

## 2020-07-21 DIAGNOSIS — Z7982 Long term (current) use of aspirin: Secondary | ICD-10-CM | POA: Insufficient documentation

## 2020-07-21 DIAGNOSIS — N189 Chronic kidney disease, unspecified: Secondary | ICD-10-CM

## 2020-07-21 DIAGNOSIS — F0390 Unspecified dementia without behavioral disturbance: Secondary | ICD-10-CM | POA: Insufficient documentation

## 2020-07-21 DIAGNOSIS — E875 Hyperkalemia: Secondary | ICD-10-CM | POA: Diagnosis present

## 2020-07-21 DIAGNOSIS — Z87898 Personal history of other specified conditions: Secondary | ICD-10-CM

## 2020-07-21 DIAGNOSIS — Z9181 History of falling: Secondary | ICD-10-CM | POA: Insufficient documentation

## 2020-07-21 DIAGNOSIS — S0990XA Unspecified injury of head, initial encounter: Secondary | ICD-10-CM | POA: Diagnosis present

## 2020-07-21 DIAGNOSIS — I499 Cardiac arrhythmia, unspecified: Secondary | ICD-10-CM | POA: Diagnosis not present

## 2020-07-21 DIAGNOSIS — S0003XA Contusion of scalp, initial encounter: Secondary | ICD-10-CM | POA: Diagnosis not present

## 2020-07-21 DIAGNOSIS — Z8659 Personal history of other mental and behavioral disorders: Secondary | ICD-10-CM

## 2020-07-21 DIAGNOSIS — Z79899 Other long term (current) drug therapy: Secondary | ICD-10-CM | POA: Diagnosis not present

## 2020-07-21 DIAGNOSIS — R4182 Altered mental status, unspecified: Secondary | ICD-10-CM | POA: Insufficient documentation

## 2020-07-21 DIAGNOSIS — R531 Weakness: Secondary | ICD-10-CM | POA: Diagnosis not present

## 2020-07-21 LAB — COMPREHENSIVE METABOLIC PANEL
ALT: 14 U/L (ref 0–44)
AST: 17 U/L (ref 15–41)
Albumin: 3.9 g/dL (ref 3.5–5.0)
Alkaline Phosphatase: 68 U/L (ref 38–126)
Anion gap: 6 (ref 5–15)
BUN: 48 mg/dL — ABNORMAL HIGH (ref 8–23)
CO2: 22 mmol/L (ref 22–32)
Calcium: 9.5 mg/dL (ref 8.9–10.3)
Chloride: 107 mmol/L (ref 98–111)
Creatinine, Ser: 1.52 mg/dL — ABNORMAL HIGH (ref 0.44–1.00)
GFR, Estimated: 35 mL/min — ABNORMAL LOW (ref >60)
Glucose, Bld: 114 mg/dL — ABNORMAL HIGH (ref 70–99)
Potassium: 5.8 mmol/L — ABNORMAL HIGH (ref 3.5–5.1)
Sodium: 135 mmol/L (ref 135–145)
Total Bilirubin: 0.4 mg/dL (ref 0.3–1.2)
Total Protein: 7.9 g/dL (ref 6.5–8.1)

## 2020-07-21 LAB — CBC WITH DIFFERENTIAL/PLATELET
Abs Immature Granulocytes: 0.05 10*3/uL (ref 0.00–0.07)
Basophils Absolute: 0 10*3/uL (ref 0.0–0.1)
Basophils Relative: 0 %
Eosinophils Absolute: 0.2 10*3/uL (ref 0.0–0.5)
Eosinophils Relative: 3 %
HCT: 28.2 % — ABNORMAL LOW (ref 36.0–46.0)
Hemoglobin: 9.5 g/dL — ABNORMAL LOW (ref 12.0–15.0)
Immature Granulocytes: 1 %
Lymphocytes Relative: 15 %
Lymphs Abs: 1.1 10*3/uL (ref 0.7–4.0)
MCH: 33.6 pg (ref 26.0–34.0)
MCHC: 33.7 g/dL (ref 30.0–36.0)
MCV: 99.6 fL (ref 80.0–100.0)
Monocytes Absolute: 0.6 10*3/uL (ref 0.1–1.0)
Monocytes Relative: 8 %
Neutro Abs: 5.6 10*3/uL (ref 1.7–7.7)
Neutrophils Relative %: 73 %
Platelets: 270 10*3/uL (ref 150–400)
RBC: 2.83 MIL/uL — ABNORMAL LOW (ref 3.87–5.11)
RDW: 16.1 % — ABNORMAL HIGH (ref 11.5–15.5)
WBC: 7.6 10*3/uL (ref 4.0–10.5)
nRBC: 0 % (ref 0.0–0.2)

## 2020-07-21 LAB — AMMONIA: Ammonia: 15 umol/L (ref 9–35)

## 2020-07-21 LAB — CBG MONITORING, ED: Glucose-Capillary: 98 mg/dL (ref 70–99)

## 2020-07-21 LAB — PROTIME-INR
INR: 1 (ref 0.8–1.2)
Prothrombin Time: 13 seconds (ref 11.4–15.2)

## 2020-07-21 MED ORDER — SODIUM CHLORIDE 0.9 % IV BOLUS
500.0000 mL | Freq: Once | INTRAVENOUS | Status: AC
  Administered 2020-07-21: 500 mL via INTRAVENOUS

## 2020-07-21 NOTE — ED Provider Notes (Signed)
[facility] Emergency Department Provider Note ____________________________________________   Event Date/Time   First MD Initiated Contact with Patient 07/21/20 1850     (approximate)  I have reviewed the triage vital signs and the nursing notes.   HISTORY  Chief Complaint Altered Mental Status  EM caveat: Patient history of dementia, somewhat poor historian  HPI [name] is a 80 y.o. female history of multiple chronic medical conditions including dementia, vascular dementia, encephalopathy pneumonia seizure disorder etc.  Patient presents today from her nursing facility, but 1 hour prior to arrival she was apparently noted to start yelling out and had an abrupt change in her normal behavior.  Patient denies any concerns right now.  Patient reports she feels no pain no discomfort no symptoms of anything.  Of note though the patient is disoriented reports the year to be 2018 the month to be April  Specifically denies any headache neck pain chest pain.  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   Basal ganglia infarction (HCC) 01/08/2013   CAP (community acquired pneumonia)    Chest pain 05/14/2015   Chronic combined systolic and diastolic CHF (congestive heart failure) (HCC) 03/06/2014   Congestive heart failure (CHF) (HCC)    Constipation 02/24/2013   Cough    Diastolic dysfunction-grade 3 05/20/2015   Dyspnea 02/24/2013   Encephalopathy 12/28/2012   Epilepsy (HCC)    Generalized seizure  (HCC) 12/28/2012   H/O: CVA (cerebrovascular accident) 01/04/2013   Hyperammonemia (HCC) 05/17/2015   Hyperglycemia 02/24/2013   Hyperkalemia 02/11/2013   Hypertension    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   Hyponatremia 02/11/2013   Influenza A 02/11/2013   Liver enzyme elevation 02/11/2013   Metabolic acidosis 12/28/2012   Metabolic encephalopathy 05/16/2015   NICM (nonischemic cardiomyopathy) -EF 25% 05/20/2015   Pleuritic chest pain 02/24/2013   PNA (pneumonia) 05/20/2015   Pneumobilia 02/12/2013   Pneumonia    Seizure (HCC) 12/28/2012   Seizures (HCC)    Smoker    Stroke (HCC)    Troponin level elevated 05/14/2015   Volume overload 02/24/2013   Weight gain 02/24/2013    Patient Active Problem List   Diagnosis Date Noted   Coronary artery disease involving native coronary artery of native heart with angina pectoris (HCC) 05/12/2020   Closed fracture of left hip (HCC) 03/27/2020   SIRS (systemic inflammatory response syndrome) (HCC) 03/27/2020   Intertrochanteric fracture of right femur (HCC) 01/04/2020   Humeral head fracture 01/04/2020   Humeral surgical neck fracture 01/04/2020   Witnessed seizure-like activity (HCC) 12/27/2019   Chronic kidney disease, stage 3b (HCC) 12/27/2019   Alzheimer's dementia without behavioral disturbance (HCC) 12/27/2019   Gout 12/27/2019   Leukocytosis 12/27/2019   Cough    Encounter for palliative care    Goals of care, counseling/discussion    Pneumonia    NICM (nonischemic cardiomyopathy) -EF 25% 05/20/2015   No significant CAD at cath May 2012 05/20/2015   Anemia 05/20/2015   Diastolic dysfunction-grade 3 05/20/2015  Acute renal failure superimposed on stage 3 chronic kidney disease (HCC)    CAP (community acquired pneumonia)    Hyperammonemia (HCC) 05/17/2015   Metabolic encephalopathy 05/16/2015   Acute on chronic renal insufficiency 05/15/2015   Chest pain 05/14/2015   Troponin level elevated 05/14/2015   Chronic combined  systolic and diastolic congestive heart failure (HCC) 03/06/2014   Acute combined systolic and diastolic congestive heart failure (HCC) 02/24/2013   Weight gain 02/24/2013   Volume overload 02/24/2013   Hyperglycemia 02/24/2013   Dyspnea 02/24/2013   Constipation 02/24/2013   Abdominal pain 02/24/2013   Pleuritic chest pain 02/24/2013   Epilepsy (HCC)    Smoker    Ataxia, 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   Protein-calorie malnutrition, severe- wgt 86 lbs 02/13/2013   Pneumobilia 02/12/2013   Influenza A 02/11/2013   Hyperkalemia 02/11/2013   Hyponatremia 02/11/2013   HCAP (healthcare-associated pneumonia) 02/11/2013   Liver enzyme elevation 02/11/2013   Basal ganglia infarction (HCC) 01/08/2013   H/O: CVA (cerebrovascular accident) 01/04/2013   Generalized seizure (HCC) 12/28/2012   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   Seizure disorder (HCC) 12/28/2012   Metabolic acidosis 12/28/2012   Encephalopathy 12/28/2012   Acute renal failure (HCC) 12/28/2012    Past Surgical History:  Procedure Laterality Date   FEMUR IM NAIL Right 01/05/2020   INTRAMEDULLARY (IM) NAIL INTERTROCHANTERIC Right 01/05/2020   Procedure: INTRAMEDULLARY (IM) NAIL INTERTROCHANTRIC;  Surgeon: [name], MD;  Location: [facility] OR;  Service: Orthopedics;  Laterality: Right;   NO PAST SURGERIES     TOTAL HIP ARTHROPLASTY Left 03/29/2020   Procedure: TOTAL HIP ARTHROPLASTY ANTERIOR APPROACH;  Surgeon: Samson Frederic, MD;  Location: [facility] ORS;  Service: Orthopedics;  Laterality: Left;    Prior to Admission medications   Medication Sig Start Date End Date Taking? Authorizing Provider  acetaminophen (TYLENOL) 325 MG tablet Take 650 mg by mouth every 6 (six) hours as needed.   Yes [provider]  allopurinol (ZYLOPRIM) 100 MG tablet Take 1 tablet (100 mg total) by mouth daily. 04/17/20  Yes Fargo, Amy E, NP  amLODipine (NORVASC) 10 MG tablet Take 1 tablet (10 mg total) by mouth  daily. 04/17/20  Yes Fargo, Amy E, NP  aspirin EC 81 MG tablet Take 81 mg by mouth in the morning and at bedtime. Swallow whole.   Yes [provider]  carvedilol (COREG) 12.5 MG tablet Take 1 tablet (12.5 mg total) by mouth 2 (two) times daily with a meal. 04/17/20  Yes Fargo, Amy E, NP  docusate sodium (COLACE) 100 MG capsule Take 1 capsule (100 mg total) by mouth 2 (two) times daily. 01/09/20  Yes Pahwani, Rinka R, MD  donepezil (ARICEPT) 5 MG tablet Take 1 tablet (5 mg total) by mouth daily. 04/17/20  Yes Fargo, Amy E, NP  Ensure (ENSURE) Take 237 mLs by mouth 3 (three) times daily between meals.   Yes [provider]  FEROSUL 325 (65 Fe) MG tablet Take 1 tablet (325 mg total) by mouth every morning. 04/17/20  Yes Fargo, Amy E, NP  lacosamide (VIMPAT) 200 MG TABS tablet Take 1 tablet (200 mg total) by mouth 2 (two) times daily. 04/17/20  Yes Fargo, Amy E, NP  levETIRAcetam (KEPPRA) 1000 MG tablet Take 1 tablet (1,000 mg total) by mouth 2 (two) times daily. 04/17/20  Yes Fargo, Amy E, NP  losartan (COZAAR) 100 MG tablet Take 1 tablet (100 mg total) by mouth daily. 04/17/20  Yes Fargo, Amy E,  NP  Multiple Vitamin (MULTIVITAMIN WITH MINERALS) TABS tablet Take 1 tablet by mouth daily. 01/10/20  Yes Pahwani, Rinka R, MD  polyethylene glycol (MIRALAX / GLYCOLAX) 17 g packet Take 17 g by mouth daily as needed for mild constipation. 01/09/20  Yes Pahwani, Rinka R, MD    Allergies Banana, Budesonide-formoterol fumarate, Fish allergy, Lisinopril, Mirtazapine, and Sodium bicarbonate  Family History  Problem Relation Age of Onset   Cancer Father    Anemia Daughter     Social History Social History   Tobacco Use   Smoking status: Some Days    Packs/day: 0.25    Pack years: 0.00    Types: Cigarettes   Smokeless tobacco: Never   Tobacco comments:    1/2 caigarettes a day  Vaping Use   Vaping Use: Never used  Substance Use Topics   Alcohol use: Yes    Alcohol/week: 0.0 standard  drinks    Comment: occasional   Drug use: No    Review of Systems Constitutional: No fever/chills ENT: No sore throat.  No neck pain Cardiovascular: Denies chest pain. Respiratory: Denies shortness of breath. Gastrointestinal: No abdominal pain.   Genitourinary: Negative for dysuria. Musculoskeletal: Negative for back pain. Skin: Negative for rash. Neurological: Negative for headaches, areas of focal weakness or numbness.  Denies any falls or injuries.  ____________________________________________   PHYSICAL EXAM:  VITAL SIGNS: ED Triage Vitals [07/21/20 1847]  Enc Vitals Group     BP      Pulse      Resp      Temp      Temp src      SpO2      Weight 91 lb (41.3 kg)     Height 5' (1.524 m)     Head Circumference      Peak Flow      Pain Score 0     Pain Loc      Pain Edu?      Excl. in GC?     Constitutional: Alert and oriented to self being in hospital and living in a nursing facility but not to year or month. Well appearing and in no acute distress. Eyes: Conjunctivae are normal. Head: Atraumatic. Nose: No congestion/rhinnorhea. Mouth/Throat: Mucous membranes are moist. Neck: No stridor.  Cardiovascular: Normal rate, regular rhythm. Grossly normal heart sounds.  Good peripheral circulation. Respiratory: Normal respiratory effort.  No retractions. Lungs CTAB. Gastrointestinal: Soft and nontender. No distention. Musculoskeletal: No lower extremity tenderness nor edema. Neurologic:  Normal speech and language. No gross focal neurologic deficits are appreciated.  4 out of 5 strength the lower extremities bilaterally.  This appears to be chronic Skin:  Skin is warm, dry and intact. No rash noted. Psychiatric: Mood and affect are normal. Speech and behavior are normal.  Note patient is very pleasant appears to be in no distress with very reassuring exam at this time.  ____________________________________________   LABS (all labs ordered are listed, but only  abnormal results are displayed)  Labs Reviewed  COMPREHENSIVE METABOLIC PANEL - Abnormal; Notable for the following components:      Result Value   Potassium 5.8 (*)    Glucose, Bld 114 (*)    BUN 48 (*)    Creatinine, Ser 1.52 (*)    GFR, Estimated 35 (*)    All other components within normal limits  CBC WITH DIFFERENTIAL/PLATELET - Abnormal; Notable for the following components:   RBC 2.83 (*)    Hemoglobin 9.5 (*)  HCT 28.2 (*)    RDW 16.1 (*)    All other components within normal limits  AMMONIA  PROTIME-INR  URINALYSIS, COMPLETE (UACMP) WITH MICROSCOPIC  CBG MONITORING, ED   ____________________________________________  EKG  Reviewed interpreted at 1920 Heart rate 80 Cures 140 QTc 520 Sinus rhythm occasional, left bundle branch block.  No evidence of acute ischemia ____________________________________________  RADIOLOGY  CT HEAD WO CONTRAST  Result Date: 07/21/2020 CLINICAL DATA:  Delirium.  Altered mental status. EXAM: CT HEAD WITHOUT CONTRAST TECHNIQUE: Contiguous axial images were obtained from the base of the skull through the vertex without intravenous contrast. COMPARISON:  Head CT 07/08/2020 FINDINGS: Brain: There are bilateral subdural collections. On the left this measures 8-9 mm, and is diminished from prior exam where it measured up to 11 mm. There is a small high-density component consistent with acute hemorrhage, series 4, image 31. Small right frontal subdural collection measures up to 5 mm and has a acute or subacute hemorrhagic component. This is also diminished in size fall previously measuring 8 mm. There is slight mass effect on the underlying brain parenchyma. Underlying atrophy and chronic small vessel ischemia are again seen. Small remote chronic infarcts right basal ganglia, bilateral thalami, and high left parietal lobe, unchanged. No evidence of acute ischemia. No hydrocephalus. Vascular: Atherosclerosis of skullbase vasculature without hyperdense  vessel or abnormal calcification. Skull: No fracture or focal lesion. Sinuses/Orbits: Chronic mucosal thickening of left side of sphenoid sinus with cortical thickening. No acute findings. Other: Diminishing frontal scalp hematoma from prior. IMPRESSION: 1. Bilateral subdural collections, left greater than right, both of which have diminished in size from prior exam. These may represent subdural hygromas or chronic subdural hematomas, however there is a small amount of acute blood within both the right and left collection. There is mild underlying mass effect on the brain parenchyma. 2. Atrophy and chronic small vessel ischemia. Multiple remote chronic infarcts. These results were called by telephone at the time of interpretation on 07/21/2020 at 7:24 pm to provider Addis Bennie , who verbally acknowledged these results. Electronically Signed   By: Narda Rutherford M.D.   On: 07/21/2020 19:24    Imaging as above discussed with both Dr. Marisue Humble radiology as well as Dr. Whitney Post of neurosurgery notable for subdural collections bilaterally possibly a small amount of acute blood in the right and left collections ____________________________________________   PROCEDURES  Procedure(s) performed: None  Procedures  Critical Care performed: Yes, see critical care note(s)  CRITICAL CARE Performed by: Sharyn Creamer   Total critical care time: 30 minutes  Critical care time was exclusive of separately billable procedures and treating other patients.  Critical care was necessary to treat or prevent imminent or life-threatening deterioration.  Critical care was time spent personally by me on the following activities: development of treatment plan with patient and/or surrogate as well as nursing, discussions with consultants, evaluation of patient's response to treatment, examination of patient, obtaining history from patient or surrogate, ordering and performing treatments and interventions, ordering and  review of laboratory studies, ordering and review of radiographic studies, pulse oximetry and re-evaluation of patient's condition.  ____________________________________________   INITIAL IMPRESSION / ASSESSMENT AND PLAN / ED COURSE  Pertinent labs & imaging results that were available during my care of the patient were reviewed by me and considered in my medical decision making (see chart for details).   Patient presents for evaluation for what apparently was reported as an abrupt change in her behavior including yelling out,  possibly some sort of change in mental status.  However in the ER and review of her notes she does appear to have a history of dementia, she is in no distress no focal abnormality on examination nothing that would make me think she had an acute stroke.  She has a very reassuring exam at this time but on CT imaging does have bilateral subdural hematoma versus hygroma, also potentially a very small amount of hemorrhage.  She is not anticoagulated she does not appear to be in any antiplatelet agents.  Reviewed and discussed patient's symptoms presentation with Dr. Whitney Post of neurosurgery, he advises observation and repeat head CT for stability which I think is a reasonable plan.  Also advises that seems unlikely the patient's change in mental status would necessarily be related to these.  Nonetheless with a small amount of possible hemorrhage we will certainly plan to observe her for repeat imaging, in addition she would certainly not be a tPA candidate if a stroke or other is detected as there is a small amount of hemorrhage and subdurals  At this point, further work-up including metabolic toxic etc. work-up for encephalopathy ongoing    ----------------------------------------- 11:28 PM on 07/21/2020 ----------------------------------------- Patient resting at this time, remains disoriented but no acute distress.  Alerts to voice, pleasant and compliant.  At this point  pending work-up does include urinalysis, observation, anticipate repeat head CT.  Neurosurgery Dr. Adriana Simas advising if repeat head CT stable in the morning while he would recommend no further neurosurgical intervention at this point.  Discussed admission for further work-up of altered mental status, repeat head CT etc. with Dr. Cyndia Bent  ____________________________________________   FINAL CLINICAL IMPRESSION(S) / ED DIAGNOSES  Final diagnoses:  Subdural hematoma (HCC)  Altered mental status, unspecified altered mental status type        Note:  This document was prepared using Dragon voice recognition software and may include unintentional dictation errors       Sharyn Creamer, MD 07/21/20 2335

## 2020-07-21 NOTE — ED Notes (Signed)
Lab contacted for lab collection r/t patient difficult stick.

## 2020-07-21 NOTE — ED Notes (Signed)
ED Provider at bedside. 

## 2020-07-21 NOTE — ED Notes (Signed)
Patient transported to CT 

## 2020-07-21 NOTE — ED Notes (Signed)
Lab at bedside

## 2020-07-21 NOTE — ED Triage Notes (Signed)
Patient arrives via [facility] from [facility] after an acute onset of AMS. The facility reports her roommate stated that she started yelling, and saying things that didn't make sense. Staff report a fall on 07/08/2020. Patient is alert, oriented only to person and place. Patient pupils noted to be pinpoint on arrival to ED.

## 2020-07-22 ENCOUNTER — Encounter: Payer: Self-pay | Admitting: Family Medicine

## 2020-07-22 ENCOUNTER — Observation Stay: Payer: Medicare HMO

## 2020-07-22 DIAGNOSIS — R4182 Altered mental status, unspecified: Secondary | ICD-10-CM

## 2020-07-22 DIAGNOSIS — I10 Essential (primary) hypertension: Secondary | ICD-10-CM | POA: Diagnosis not present

## 2020-07-22 DIAGNOSIS — E875 Hyperkalemia: Secondary | ICD-10-CM

## 2020-07-22 DIAGNOSIS — S065XAA Traumatic subdural hemorrhage with loss of consciousness status unknown, initial encounter: Secondary | ICD-10-CM | POA: Diagnosis present

## 2020-07-22 DIAGNOSIS — S065X9A Traumatic subdural hemorrhage with loss of consciousness of unspecified duration, initial encounter: Secondary | ICD-10-CM | POA: Diagnosis not present

## 2020-07-22 DIAGNOSIS — S065X0A Traumatic subdural hemorrhage without loss of consciousness, initial encounter: Secondary | ICD-10-CM | POA: Diagnosis not present

## 2020-07-22 DIAGNOSIS — N179 Acute kidney failure, unspecified: Secondary | ICD-10-CM | POA: Diagnosis not present

## 2020-07-22 DIAGNOSIS — Z87898 Personal history of other specified conditions: Secondary | ICD-10-CM | POA: Diagnosis not present

## 2020-07-22 DIAGNOSIS — N189 Chronic kidney disease, unspecified: Secondary | ICD-10-CM | POA: Diagnosis not present

## 2020-07-22 DIAGNOSIS — Z8659 Personal history of other mental and behavioral disorders: Secondary | ICD-10-CM

## 2020-07-22 DIAGNOSIS — I6523 Occlusion and stenosis of bilateral carotid arteries: Secondary | ICD-10-CM | POA: Diagnosis not present

## 2020-07-22 DIAGNOSIS — I672 Cerebral atherosclerosis: Secondary | ICD-10-CM | POA: Diagnosis not present

## 2020-07-22 LAB — SARS CORONAVIRUS 2 (TAT 6-24 HRS): SARS Coronavirus 2: NEGATIVE

## 2020-07-22 LAB — MRSA PCR SCREENING: MRSA by PCR: NEGATIVE

## 2020-07-22 MED ORDER — HYDRALAZINE HCL 20 MG/ML IJ SOLN: 5.0000 mg | INTRAMUSCULAR | Status: DC | PRN

## 2020-07-22 MED ORDER — ONDANSETRON HCL 4 MG/2ML IJ SOLN
INTRAMUSCULAR | Status: AC
  Administered 2020-07-22: 4 mg via INTRAVENOUS
  Filled 2020-07-22: qty 2

## 2020-07-22 MED ORDER — SODIUM ZIRCONIUM CYCLOSILICATE 5 G PO PACK
5.0000 g | PACK | Freq: Once | ORAL | Status: DC
  Filled 2020-07-22: qty 1

## 2020-07-22 MED ORDER — POLYETHYLENE GLYCOL 3350 17 G PO PACK
17.0000 g | PACK | Freq: Every day | ORAL | Status: DC | PRN
  Administered 2020-07-24: 17 g via ORAL
  Filled 2020-07-22: qty 1

## 2020-07-22 MED ORDER — FERROUS SULFATE 325 (65 FE) MG PO TABS
325.0000 mg | ORAL_TABLET | Freq: Every morning | ORAL | Status: DC
  Administered 2020-07-22 – 2020-07-29 (×8): 325 mg via ORAL
  Filled 2020-07-22 (×6): qty 1

## 2020-07-22 MED ORDER — AMLODIPINE BESYLATE 10 MG PO TABS
10.0000 mg | ORAL_TABLET | Freq: Every day | ORAL | Status: DC
  Administered 2020-07-22 – 2020-07-29 (×8): 10 mg via ORAL
  Filled 2020-07-22 (×2): qty 1
  Filled 2020-07-22: qty 2
  Filled 2020-07-22 (×5): qty 1

## 2020-07-22 MED ORDER — CARVEDILOL 6.25 MG PO TABS
12.5000 mg | ORAL_TABLET | Freq: Two times a day (BID) | ORAL | Status: DC
  Administered 2020-07-22 (×2): 12.5 mg via ORAL
  Filled 2020-07-22 (×2): qty 2

## 2020-07-22 MED ORDER — DOCUSATE SODIUM 100 MG PO CAPS
100.0000 mg | ORAL_CAPSULE | Freq: Two times a day (BID) | ORAL | Status: DC
  Administered 2020-07-22 – 2020-07-29 (×15): 100 mg via ORAL
  Filled 2020-07-22 (×15): qty 1

## 2020-07-22 MED ORDER — ALLOPURINOL 100 MG PO TABS
100.0000 mg | ORAL_TABLET | Freq: Every day | ORAL | Status: DC
  Administered 2020-07-22 – 2020-07-29 (×8): 100 mg via ORAL
  Filled 2020-07-22 (×8): qty 1

## 2020-07-22 MED ORDER — LACOSAMIDE 200 MG PO TABS
200.0000 mg | ORAL_TABLET | Freq: Two times a day (BID) | ORAL | Status: DC
  Administered 2020-07-22 – 2020-07-29 (×15): 200 mg via ORAL
  Filled 2020-07-22 (×4): qty 1
  Filled 2020-07-22: qty 4
  Filled 2020-07-22 (×10): qty 1

## 2020-07-22 MED ORDER — ONDANSETRON HCL 4 MG/2ML IJ SOLN: 4.0000 mg | Freq: Four times a day (QID) | INTRAMUSCULAR | Status: DC | PRN

## 2020-07-22 MED ORDER — DONEPEZIL HCL 5 MG PO TABS
5.0000 mg | ORAL_TABLET | Freq: Every day | ORAL | Status: DC
  Administered 2020-07-22 – 2020-07-29 (×8): 5 mg via ORAL
  Filled 2020-07-22 (×8): qty 1

## 2020-07-22 MED ORDER — LEVETIRACETAM 500 MG PO TABS
1000.0000 mg | ORAL_TABLET | Freq: Two times a day (BID) | ORAL | Status: DC
  Administered 2020-07-22 – 2020-07-23 (×4): 1000 mg via ORAL
  Filled 2020-07-22 (×5): qty 2

## 2020-07-22 NOTE — ED Notes (Signed)
Purewick changed and sheets changed. Pt assisted with breakfast tray.

## 2020-07-22 NOTE — ED Notes (Signed)
Patient transported to CT 

## 2020-07-22 NOTE — H&P (Signed)
History and Physical    [name] BLT:[id number] DOB: Nov 29, 1940 DOA: 07/21/2020  PCP: [name], MD  Patient coming from: [facility]   I have personally briefly reviewed patient's old medical records in [facility]  Chief Complaint: AMS  HPI: [name] is a 80 y.o. female with medical history significant for Dementia, seizure, HTN, CVA, CKD stage 3b, systolic and diastolic HF, who presents with AMS.   Reportedly she was brought in by her nursing facility for yelling out and abrupt changes in her normal behavior an hour prior to arrival to the ED.  Patient does not recall this event.  She notes that several days ago she had a fall when she was coming down the last step of stairs.  States she fell on the back of her head but was able to get up without loss of consciousness.  She denies any headache or vision changes.  Denies any frank pain but unable to provide much other history.   She was evaluated on 6/8 for a fall and at that time CT head and cervical spine was read as having no acute findings.   ED Course: She was hypertensive with a systolic of 170s over 90s.  No leukocytosis.  Anemia at baseline with hemoglobin of 9.5.  Hypokalemia with potassium of 5.8.  Creatinine of 1.52 from a prior of 1.36.  Unclear what her baseline is.  BG of 114. She notably had some bruising beneath her eyes and left upper extremity.  CT head shows bilateral subdural collections left greater than right that have diminished from prior exam.  Dr. Fanny Bien discussed case with neurosurgery Dr. Adriana Simas who recommends observation and repeat CT head in 6 hours.   Review of Systems:  Constitutional: No Weight Change, No Fever ENT/Mouth: No sore throat, No Rhinorrhea Eyes: No Eye Pain, No Vision Changes Cardiovascular: No Chest Pain,  Respiratory: No Cough,  Gastrointestinal: No Nausea, No Vomiting, , No Pain Genitourinary: No Flank Pain Musculoskeletal: No Arthralgias,  No Myalgias Skin: No Skin Lesions, No Pruritus, Neuro: no Weakness, Psych: No Anxiety/Panic, No Depression, no decrease appetite Heme/Lymph: No Bruising, No Bleeding  Past Medical History:  Diagnosis Date   Abdominal pain 02/24/2013   Acute combined systolic and diastolic congestive heart failure (HCC) 02/24/2013   Acute on chronic renal insufficiency 05/15/2015   Acute renal failure (HCC) 12/28/2012   Acute renal failure superimposed on stage 3 chronic kidney disease (HCC)    Anemia 05/20/2015   Ataxia, 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   Basal ganglia infarction (HCC) 01/08/2013   CAP (community acquired pneumonia)    Chest pain 05/14/2015   Chronic combined systolic and diastolic CHF (congestive heart failure) (HCC) 03/06/2014   Congestive heart failure (CHF) (HCC)    Constipation 02/24/2013   Cough    Diastolic dysfunction-grade 3 05/20/2015   Dyspnea 02/24/2013   Encephalopathy 12/28/2012   Epilepsy (HCC)    Generalized seizure (HCC) 12/28/2012   H/O: CVA (cerebrovascular accident) 01/04/2013   Hyperammonemia (HCC) 05/17/2015   Hyperglycemia 02/24/2013   Hyperkalemia 02/11/2013   Hypertension    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   Hyponatremia 02/11/2013   Influenza A 02/11/2013   Liver enzyme elevation 02/11/2013   Metabolic acidosis 12/28/2012   Metabolic encephalopathy 05/16/2015   NICM (nonischemic cardiomyopathy) -EF 25% 05/20/2015   Pleuritic chest pain 02/24/2013   PNA (pneumonia) 05/20/2015   Pneumobilia 02/12/2013   Pneumonia    Seizure (HCC) 12/28/2012   Seizures (  HCC)    Smoker    Stroke [facility])    Troponin level elevated 05/14/2015   Volume overload 02/24/2013   Weight gain 02/24/2013    Past Surgical History:  Procedure Laterality Date   FEMUR IM NAIL Right 01/05/2020   INTRAMEDULLARY (IM) NAIL INTERTROCHANTERIC Right 01/05/2020   Procedure: INTRAMEDULLARY (IM) NAIL INTERTROCHANTRIC;  Surgeon: [name], MD;  Location: [facility] OR;  Service:  Orthopedics;  Laterality: Right;   NO PAST SURGERIES     TOTAL HIP ARTHROPLASTY Left 03/29/2020   Procedure: TOTAL HIP ARTHROPLASTY ANTERIOR APPROACH;  Surgeon: Samson Frederic, MD;  Location: [facility] ORS;  Service: Orthopedics;  Laterality: Left;     reports that she has been smoking cigarettes. She has been smoking an average of 0.25 packs per day. She has never used smokeless tobacco. She reports current alcohol use. She reports that she does not use drugs. Social History  Allergies  Allergen Reactions   Banana     Raises K+   Budesonide-Formoterol Fumarate     Other reaction(s): Sore throat Other reaction(s): Sore throat   Fish Allergy    Lisinopril     Other reaction(s): nausea Other reaction(s): nausea   Mirtazapine     Other reaction(s): Severe Headaches Other reaction(s): Severe Headaches   Sodium Bicarbonate     Other reaction(s): Makes her vomit Other reaction(s): Makes her vomit    Family History  Problem Relation Age of Onset   Cancer Father    Anemia Daughter      Prior to Admission medications   Medication Sig Start Date End Date Taking? Authorizing Provider  acetaminophen (TYLENOL) 325 MG tablet Take 650 mg by mouth every 6 (six) hours as needed.   Yes [provider]  allopurinol (ZYLOPRIM) 100 MG tablet Take 1 tablet (100 mg total) by mouth daily. 04/17/20  Yes Fargo, Amy E, NP  amLODipine (NORVASC) 10 MG tablet Take 1 tablet (10 mg total) by mouth daily. 04/17/20  Yes Fargo, Amy E, NP  aspirin EC 81 MG tablet Take 81 mg by mouth in the morning and at bedtime. Swallow whole.   Yes [provider]  carvedilol (COREG) 12.5 MG tablet Take 1 tablet (12.5 mg total) by mouth 2 (two) times daily with a meal. 04/17/20  Yes Fargo, Amy E, NP  docusate sodium (COLACE) 100 MG capsule Take 1 capsule (100 mg total) by mouth 2 (two) times daily. 01/09/20  Yes Pahwani, Rinka R, MD  donepezil (ARICEPT) 5 MG tablet Take 1 tablet (5 mg total) by mouth daily. 04/17/20   Yes Fargo, Amy E, NP  Ensure (ENSURE) Take 237 mLs by mouth 3 (three) times daily between meals.   Yes [provider]  FEROSUL 325 (65 Fe) MG tablet Take 1 tablet (325 mg total) by mouth every morning. 04/17/20  Yes Fargo, Amy E, NP  lacosamide (VIMPAT) 200 MG TABS tablet Take 1 tablet (200 mg total) by mouth 2 (two) times daily. 04/17/20  Yes Fargo, Amy E, NP  levETIRAcetam (KEPPRA) 1000 MG tablet Take 1 tablet (1,000 mg total) by mouth 2 (two) times daily. 04/17/20  Yes Fargo, Amy E, NP  losartan (COZAAR) 100 MG tablet Take 1 tablet (100 mg total) by mouth daily. 04/17/20  Yes Fargo, Amy E, NP  Multiple Vitamin (MULTIVITAMIN WITH MINERALS) TABS tablet Take 1 tablet by mouth daily. 01/10/20  Yes Pahwani, Rinka R, MD  polyethylene glycol (MIRALAX / GLYCOLAX) 17 g packet Take 17 g by mouth daily as  needed for mild constipation. 01/09/20  Yes Ollen Bowl, MD    Physical Exam: Vitals:   07/21/20 2230 07/21/20 2300 07/21/20 2330 07/22/20 0001  BP: (!) 162/91 (!) 159/83 (!) 168/98   Pulse: 70   78  Resp: 16     Temp:      TempSrc:      SpO2: 100%   99%  Weight:      Height:        Constitutional: NAD, calm, comfortable, elderly female laying flat in bed initially asleep Vitals:   07/21/20 2230 07/21/20 2300 07/21/20 2330 07/22/20 0001  BP: (!) 162/91 (!) 159/83 (!) 168/98   Pulse: 70   78  Resp: 16     Temp:      TempSrc:      SpO2: 100%   99%  Weight:      Height:       Eyes: pinpoint pupils, PERRL, lids and conjunctivae normal ENMT: Mucous membranes are moist.  Neck: normal, supple Respiratory: clear to auscultation bilaterally, no wheezing, no crackles. Normal respiratory effort. No accessory muscle use.  Cardiovascular: Regular rate and rhythm, no murmurs / rubs / gallops. No extremity edema. 2+ pedal pulses.  Abdomen: no tenderness, no masses palpated.  Bowel sounds positive.  Musculoskeletal: no clubbing / cyanosis. No joint deformity upper and lower extremities.  Good ROM, no contractures. Wasting muscle tone on all extremities  Skin: bruising below both eyes and on left dorsal hand Neurologic: CN 2-12 grossly intact. Sensation intact.  Psychiatric: Alert and oriented x 3. Normal mood.    Labs on Admission: I have personally reviewed following labs and imaging studies  CBC: Recent Labs  Lab 07/21/20 1935  WBC 7.6  NEUTROABS 5.6  HGB 9.5*  HCT 28.2*  MCV 99.6  PLT 270   Basic Metabolic Panel: Recent Labs  Lab 07/21/20 1935  NA 135  K 5.8*  CL 107  CO2 22  GLUCOSE 114*  BUN 48*  CREATININE 1.52*  CALCIUM 9.5   GFR: Estimated Creatinine Clearance: 20.2 mL/min (A) (by C-G formula based on SCr of 1.52 mg/dL (H)). Liver Function Tests: Recent Labs  Lab 07/21/20 1935  AST 17  ALT 14  ALKPHOS 68  BILITOT 0.4  PROT 7.9  ALBUMIN 3.9   No results for input(s): LIPASE, AMYLASE in the last 168 hours. Recent Labs  Lab 07/21/20 1935  AMMONIA 15   Coagulation Profile: Recent Labs  Lab 07/21/20 1935  INR 1.0   Cardiac Enzymes: No results for input(s): CKTOTAL, CKMB, CKMBINDEX, TROPONINI in the last 168 hours. BNP (last 3 results) No results for input(s): PROBNP in the last 8760 hours. HbA1C: No results for input(s): HGBA1C in the last 72 hours. CBG: Recent Labs  Lab 07/21/20 1956  GLUCAP 98   Lipid Profile: No results for input(s): CHOL, HDL, LDLCALC, TRIG, CHOLHDL, LDLDIRECT in the last 72 hours. Thyroid Function Tests: No results for input(s): TSH, T4TOTAL, FREET4, T3FREE, THYROIDAB in the last 72 hours. Anemia Panel: No results for input(s): VITAMINB12, FOLATE, FERRITIN, TIBC, IRON, RETICCTPCT in the last 72 hours. Urine analysis:    Component Value Date/Time   COLORURINE YELLOW 05/08/2020 2205   APPEARANCEUR CLEAR 05/08/2020 2205   LABSPEC 1.009 05/08/2020 2205   PHURINE 7.0 05/08/2020 2205   GLUCOSEU NEGATIVE 05/08/2020 2205   HGBUR NEGATIVE 05/08/2020 2205   BILIRUBINUR NEGATIVE 05/08/2020 2205    KETONESUR NEGATIVE 05/08/2020 2205   PROTEINUR 30 (A) 05/08/2020 2205   UROBILINOGEN 0.2  03/05/2014 1324   NITRITE NEGATIVE 05/08/2020 2205   LEUKOCYTESUR TRACE (A) 05/08/2020 2205    Radiological Exams on Admission: CT HEAD WO CONTRAST  Result Date: 07/21/2020 CLINICAL DATA:  Delirium.  Altered mental status. EXAM: CT HEAD WITHOUT CONTRAST TECHNIQUE: Contiguous axial images were obtained from the base of the skull through the vertex without intravenous contrast. COMPARISON:  Head CT 07/08/2020 FINDINGS: Brain: There are bilateral subdural collections. On the left this measures 8-9 mm, and is diminished from prior exam where it measured up to 11 mm. There is a small high-density component consistent with acute hemorrhage, series 4, image 31. Small right frontal subdural collection measures up to 5 mm and has a acute or subacute hemorrhagic component. This is also diminished in size fall previously measuring 8 mm. There is slight mass effect on the underlying brain parenchyma. Underlying atrophy and chronic small vessel ischemia are again seen. Small remote chronic infarcts right basal ganglia, bilateral thalami, and high left parietal lobe, unchanged. No evidence of acute ischemia. No hydrocephalus. Vascular: Atherosclerosis of skullbase vasculature without hyperdense vessel or abnormal calcification. Skull: No fracture or focal lesion. Sinuses/Orbits: Chronic mucosal thickening of left side of sphenoid sinus with cortical thickening. No acute findings. Other: Diminishing frontal scalp hematoma from prior. IMPRESSION: 1. Bilateral subdural collections, left greater than right, both of which have diminished in size from prior exam. These may represent subdural hygromas or chronic subdural hematomas, however there is a small amount of acute blood within both the right and left collection. There is mild underlying mass effect on the brain parenchyma. 2. Atrophy and chronic small vessel ischemia. Multiple  remote chronic infarcts. These results were called by telephone at the time of interpretation on 07/21/2020 at 7:24 pm to provider MARK QUALE , who verbally acknowledged these results. Electronically Signed   By: Narda Rutherford M.D.   On: 07/21/2020 19:24      Assessment/Plan  Subdural hematoma from previous fall -frequent neurochecks q4hrs -neurosurgery Dr. Adriana Simas recommends repeat CT head in 6 hrs -Fall precaution  Hyperkalemia -K of 5.8 -Lokelma x 1   AMS -unclear the cause of her outbrust at nursing facility.  No signs of infection.  UA is pending.  AKI on CKD stage IIIa -Creatinine elevated to 1.52 from a prior 1.36. Unclear baseline -received 500cc bolus in ED. Follow with repeat BMP in the morning.  Hypertension -Tighter control due to subdural hematoma -continue amlodipine, Coreg -PRN IV hydralazine for systolic greater than 170 and/or diastolic greater than 100  History of seizure - Continue Keppra and Vimpat  History of dementia - Patient was alert and oriented x4 but was unable to provide history regarding today's events -Continue donezepil  DVT prophylaxis:.SCDs Code Status: Full Family Communication: Plan discussed with patient at bedside  disposition Plan: Home with observation Consults called:  Admission status: Observation  Level of care: Progressive Cardiac  Status is: Observation  The patient remains OBS appropriate and will d/c before 2 midnights.  Dispo: The patient is from: SNF              Anticipated d/c is to: SNF              Patient currently is not medically stable to d/c.   Difficult to place patient No         Anselm Jungling DO [facility]   If 7PM-7AM, please contact night-coverage [email or web address]   07/22/2020, 12:28 AM

## 2020-07-22 NOTE — Plan of Care (Signed)
  Problem: Education: Goal: Knowledge of General Education information will improve Description Including pain rating scale, medication(s)/side effects and non-pharmacologic comfort measures Outcome: Progressing   Problem: Health Behavior/Discharge Planning: Goal: Ability to manage health-related needs will improve Outcome: Progressing   

## 2020-07-22 NOTE — Progress Notes (Signed)
Patient seen and examined at the bedside.  She has no complaints.  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  No new intracranial abnormalities noted.  Case was discussed with Dr. Marlou Porch neurosurgeon, via secure chat.  He said he has reviewed the repeat CT scan of the head.  He said findings are stable and there is no need for neurosurgery consult or follow-up.  Patient will be downgraded from progressive cardiac unit to MedSurg.

## 2020-07-22 NOTE — Progress Notes (Signed)
PT Cancellation Note  Patient Details Name: [name] MRN: [id number] DOB: [birth year]   Cancelled Treatment:    Reason Eval/Treat Not Completed: Medical issues which prohibited therapy (Consult received and chart reviewed. Patient pending repeat head CT to evaluate stability of SDH.  Will hold PT eval/exertional activity until test complete and results received.)   Irfan Veal H. Manson Passey, PT, DPT, NCS 07/22/20, 10:26 AM [phone]

## 2020-07-22 NOTE — Progress Notes (Signed)
OT Cancellation Note  Patient Details Name: [name] MRN: [id number] DOB: 19-Apr-1940   Cancelled Treatment:    Reason Eval/Treat Not Completed: Medical issues which prohibited therapy. OT order received and chart reviewed. Patient pending repeat head CT to evaluate stability of SDH.  Will hold OT eval/exertional activity until test complete and results received.  Jackquline Denmark, MS, OTR/L , CBIS ascom [phone]  07/22/20, 11:20 AM    07/22/2020, 11:19 AM

## 2020-07-23 DIAGNOSIS — N189 Chronic kidney disease, unspecified: Secondary | ICD-10-CM | POA: Diagnosis not present

## 2020-07-23 DIAGNOSIS — S065X9A Traumatic subdural hemorrhage with loss of consciousness of unspecified duration, initial encounter: Secondary | ICD-10-CM | POA: Diagnosis not present

## 2020-07-23 DIAGNOSIS — N179 Acute kidney failure, unspecified: Secondary | ICD-10-CM | POA: Diagnosis not present

## 2020-07-23 DIAGNOSIS — E875 Hyperkalemia: Secondary | ICD-10-CM | POA: Diagnosis not present

## 2020-07-23 DIAGNOSIS — I10 Essential (primary) hypertension: Secondary | ICD-10-CM | POA: Diagnosis not present

## 2020-07-23 LAB — BASIC METABOLIC PANEL WITH GFR
Anion gap: 8 (ref 5–15)
BUN: 43 mg/dL — ABNORMAL HIGH (ref 8–23)
CO2: 24 mmol/L (ref 22–32)
Calcium: 9.5 mg/dL (ref 8.9–10.3)
Chloride: 99 mmol/L (ref 98–111)
Creatinine, Ser: 1.49 mg/dL — ABNORMAL HIGH (ref 0.44–1.00)
GFR, Estimated: 36 mL/min — ABNORMAL LOW
Glucose, Bld: 87 mg/dL (ref 70–99)
Potassium: 5.6 mmol/L — ABNORMAL HIGH (ref 3.5–5.1)
Sodium: 131 mmol/L — ABNORMAL LOW (ref 135–145)

## 2020-07-23 LAB — URINALYSIS, COMPLETE (UACMP) WITH MICROSCOPIC
Bacteria, UA: NONE SEEN
Bilirubin Urine: NEGATIVE
Glucose, UA: NEGATIVE mg/dL
Hgb urine dipstick: NEGATIVE
Ketones, ur: NEGATIVE mg/dL
Leukocytes,Ua: NEGATIVE
Nitrite: NEGATIVE
Protein, ur: 100 mg/dL — AB
Specific Gravity, Urine: 1.016 (ref 1.005–1.030)
pH: 6 (ref 5.0–8.0)

## 2020-07-23 LAB — CBC
HCT: 34.9 % — ABNORMAL LOW (ref 36.0–46.0)
Hemoglobin: 11.5 g/dL — ABNORMAL LOW (ref 12.0–15.0)
MCH: 32.7 pg (ref 26.0–34.0)
MCHC: 33 g/dL (ref 30.0–36.0)
MCV: 99.1 fL (ref 80.0–100.0)
Platelets: 340 10*3/uL (ref 150–400)
RBC: 3.52 MIL/uL — ABNORMAL LOW (ref 3.87–5.11)
RDW: 15.9 % — ABNORMAL HIGH (ref 11.5–15.5)
WBC: 6 10*3/uL (ref 4.0–10.5)
nRBC: 0 % (ref 0.0–0.2)

## 2020-07-23 MED ORDER — NEPRO/CARBSTEADY PO LIQD
237.0000 mL | Freq: Two times a day (BID) | ORAL | Status: DC
  Administered 2020-07-23 – 2020-07-29 (×9): 237 mL via ORAL

## 2020-07-23 MED ORDER — SODIUM ZIRCONIUM CYCLOSILICATE 10 G PO PACK
10.0000 g | PACK | Freq: Two times a day (BID) | ORAL | Status: DC
  Administered 2020-07-23 – 2020-07-24 (×3): 10 g via ORAL
  Filled 2020-07-23 (×4): qty 1

## 2020-07-23 MED ORDER — NEPRO/CARBSTEADY PO LIQD: 237.0000 mL | Freq: Two times a day (BID) | ORAL | Status: DC

## 2020-07-23 MED ORDER — CARVEDILOL 25 MG PO TABS
25.0000 mg | ORAL_TABLET | Freq: Two times a day (BID) | ORAL | Status: DC
  Administered 2020-07-23 – 2020-07-29 (×13): 25 mg via ORAL
  Filled 2020-07-23 (×14): qty 1

## 2020-07-23 NOTE — Evaluation (Signed)
Occupational Therapy Evaluation Patient Details Name: [name] MRN: [id number] DOB: November 12, 1940 Today's Date: 07/23/2020    History of Present Illness 80 y.o. female history of multiple chronic medical conditions including dementia, vascular dementia, encephalopathy pneumonia seizure disorder etc. New acute on chronic L parietal with midline shift.   Clinical Impression   Patient presenting with decreased I in self care, balance, functional mobility/transfers, endurance, and safety awareness. Patient is poor historian. Per chart review, pt with multiple admissions and prior to April 2022 she was living at home independently with use of AD as needed PTA. In April 2022, pt with hip fx and then admitted to SNF for rehab with possibility for LTC placement per chart. Pt oriented to self and location. Mod A to EOB and pt standing with max A with significant posterior bias. She stands for less than 1 minute and then returns to bed reporting she needs to "rest". Patient will benefit from acute OT to increase overall independence in the areas of ADLs, functional mobility, and safety awareness in order to safely discharge to next venue of care.    Follow Up Recommendations  SNF;Supervision/Assistance - 24 hour    Equipment Recommendations  Other (comment) (defer to next venue of care)       Precautions / Restrictions Precautions Precautions: Fall      Mobility Bed Mobility Overal bed mobility: Needs Assistance Bed Mobility: Supine to Sit;Sit to Supine     Supine to sit: Mod assist Sit to supine: Mod assist   General bed mobility comments: assist for trunk support and L LE with min cuing for technique    Transfers Overall transfer level: Needs assistance Equipment used: 1 person hand held assist Transfers: Sit to/from Stand Sit to Stand: Max assist         General transfer comment: posterior bias in standing    Balance Overall balance assessment: Needs  assistance Sitting-balance support: Feet unsupported Sitting balance-Leahy Scale: Fair   Postural control: Posterior lean Standing balance support: During functional activity Standing balance-Leahy Scale: Zero                             ADL either performed or assessed with clinical judgement   ADL Overall ADL's : Needs assistance/impaired Eating/Feeding: Set up Eating/Feeding Details (indicate cue type and reason): assist to open containers and cut food Grooming: Wash/dry hands;Wash/dry face;Set up;Supervision/safety Grooming Details (indicate cue type and reason): min cuing             Lower Body Dressing: Moderate assistance;Bed level                       Vision Baseline Vision/History: Wears glasses Patient Visual Report: No change from baseline              Pertinent Vitals/Pain Pain Assessment: Faces Faces Pain Scale: No hurt     Hand Dominance Left   Extremity/Trunk Assessment Upper Extremity Assessment Upper Extremity Assessment: Generalized weakness   Lower Extremity Assessment Lower Extremity Assessment: Generalized weakness       Communication Communication Communication: No difficulties   Cognition Arousal/Alertness: Awake/alert Behavior During Therapy: Flat affect Overall Cognitive Status: History of cognitive impairments - at baseline                                 General Comments: no family present to confirm  baseline. Pt needing increased time for processing with mod multimodal cuing for sequencing. Pt oriented to self and location only.              Home Living Family/patient expects to be discharged to:: Skilled nursing facility                                        Prior Functioning/Environment Level of Independence: Needs assistance  Gait / Transfers Assistance Needed: Pt has had several recent admissions and in april had fall with hip fx and discharged to SNF for rehab.  Prior to this per chart review, she was independent at home. ADL's / Homemaking Assistance Needed: Pt was independent prior to hip fx in april. Needing assist since that time. Communication / Swallowing Assistance Needed: no difficulties Comments: Prior history obtained from chart review and report from family at previous admissions. Per chart, pt d/c to SNF for rehab with recent hip fx in April 2022. Unsure if pt has transitioned to LTC.        OT Problem List: Decreased strength;Decreased activity tolerance;Impaired balance (sitting and/or standing);Decreased safety awareness;Decreased knowledge of precautions;Decreased knowledge of use of DME or AE;Cardiopulmonary status limiting activity;Decreased cognition      OT Treatment/Interventions: Self-care/ADL training;Manual therapy;Therapeutic exercise;Patient/family education;Modalities;Neuromuscular education;Balance training;Energy conservation;Therapeutic activities;DME and/or AE instruction;Cognitive remediation/compensation    OT Goals(Current goals can be found in the care plan section) Acute Rehab OT Goals Patient Stated Goal: return to bed and rest OT Goal Formulation: With patient Time For Goal Achievement: 08/06/20 Potential to Achieve Goals: Fair ADL Goals Pt Will Perform Grooming: with min guard assist;standing Pt Will Perform Lower Body Dressing: with min guard assist;sit to/from stand Pt Will Transfer to Toilet: with min guard assist;ambulating Pt Will Perform Toileting - Clothing Manipulation and hygiene: with min guard assist;sit to/from stand  OT Frequency: Min 2X/week   Barriers to D/C:    none known at this time          AM-PAC OT "6 Clicks" Daily Activity     Outcome Measure Help from another person eating meals?: None Help from another person taking care of personal grooming?: A Little Help from another person toileting, which includes using toliet, bedpan, or urinal?: A Lot Help from another person bathing  (including washing, rinsing, drying)?: A Lot Help from another person to put on and taking off regular upper body clothing?: A Little Help from another person to put on and taking off regular lower body clothing?: A Lot 6 Click Score: 16   End of Session Nurse Communication: Mobility status  Activity Tolerance: Patient limited by fatigue Patient left: in bed;with call bell/phone within reach;with bed alarm set  OT Visit Diagnosis: Unsteadiness on feet (R26.81);Repeated falls (R29.6);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

## 2020-07-23 NOTE — Progress Notes (Signed)
PT Cancellation Note  Patient Details Name: [name] MRN: [id number] DOB: April 23, 1940   Cancelled Treatment:    Reason Eval/Treat Not Completed: Medical issues which prohibited therapy Consult received and chart reviewed. Patient noted with critically elevated K+ of 5.6, per PT practice guidelines contraindicated for exertional activity at this time.  Will continue efforts next date pending medical stability and appropriateness.    [employer], SPT

## 2020-07-23 NOTE — Progress Notes (Signed)
Initial Nutrition Assessment  DOCUMENTATION CODES:  Underweight, Severe malnutrition in context of chronic illness  INTERVENTION:  Continue current diet as ordered, encourage PO intake Nepro Shake po BID, each supplement provides 425 kcal and 19 grams protein  NUTRITION DIAGNOSIS:  Severe Malnutrition related to chronic illness (CHF, dementia) as evidenced by severe muscle depletion, severe fat depletion.  GOAL:  Patient will meet greater than or equal to 90% of their needs  MONITOR:  PO intake, Supplement acceptance, Weight trends  REASON FOR ASSESSMENT:  Malnutrition Screening Tool (low BMI)    ASSESSMENT:  Patient presented to ED from facility after an acute onset of AMS. Staff report a fall on 07/08/2020. Patient pupils noted to be pinpoint on arrival to ED. Medical hx relevant for HTN, CVA, CHF, CKD3, and dementia.  Imaging in ED shows bilateral subdural collections. AKI on admission with elevated K  Pt resting in bed at the time of visit. Lunch tray noted at bedside, untouched. Pt states she hasn't been hungry. Also reports that she does not like some of the foods she has received. Provided with a list of foods she did not want to be served, will inform dining services.   Reports she drinks supplements routinely at her facility. Will add Nepro due to elevated K.   Average Meal Intake: 6/21-6/23: 25% intake x 1 recorded meal  Nutritionally Relevant Medications: Scheduled Meds:  docusate sodium  100 mg Oral BID   ferrous sulfate  325 mg Oral q morning   sodium zirconium cyclosilicate  10 g Oral BID   PRN Meds: ondansetron, polyethylene glycol  Labs Reviewed: Na 131 K 5.6 BUN 43, creatinine 1.49  NUTRITION - FOCUSED PHYSICAL EXAM: Flowsheet Row Most Recent Value  Orbital Region Mild depletion  Upper Arm Region Severe depletion  Thoracic and Lumbar Region Severe depletion  Buccal Region Mild depletion  Temple Region Mild depletion  Clavicle Bone Region Severe  depletion  Clavicle and Acromion Bone Region Severe depletion  Scapular Bone Region Severe depletion  Dorsal Hand Moderate depletion  Patellar Region Severe depletion  Anterior Thigh Region Severe depletion  Posterior Calf Region Severe depletion  Edema (RD Assessment) None  Hair Reviewed  Eyes Reviewed  Mouth Reviewed  Skin Reviewed  Nails Reviewed   Diet Order:   Diet Order             Diet Heart Room service appropriate? Yes; Fluid consistency: Thin  Diet effective now                   EDUCATION NEEDS:  No education needs have been identified at this time  Skin:  Skin Assessment: Reviewed RN Assessment  Last BM:  PTA  Height:  Ht Readings from Last 1 Encounters:  07/21/20 5' (1.524 m)    Weight:  Wt Readings from Last 1 Encounters:  07/21/20 41.3 kg    Ideal Body Weight:  45.5 kg  BMI:  Body mass index is 17.77 kg/m.  Estimated Nutritional Needs:  Kcal:  1300-1500 kcal/d Protein:  70-85 g/d Fluid:  1.5L/d   Greig Castilla, RD, LDN Clinical Dietitian Pager on Amion

## 2020-07-23 NOTE — TOC Progression Note (Signed)
Transition of Care [facility]) - Progression Note    Patient Details  Name: [name] MRN: [id number] Date of Birth: [birth year]  Transition of Care [facility]) CM/SW Contact  Caryn Section, RN Phone Number: 07/23/2020, 4:21 PM  Clinical Narrative:   Patient is from [facility], only oriented to person, but states, "I like where I live".   Contact Alwyn Ren, son who states he wishes for patient to return to [facility] on discharge.  TOC contact information given, TOC to follow to discharge.          Expected Discharge Plan and Services                                                 Social Determinants of Health (SDOH) Interventions    Readmission Risk Interventions No flowsheet data found.

## 2020-07-23 NOTE — Progress Notes (Signed)
Progress Note    [name]  EUM:[id number] DOB: [birth year]  DOA: 07/21/2020 PCP: [name], MD      Brief Narrative:    Medical records reviewed and are as summarized below:  [name] is a 80 y.o. female with medical history significant for dementia, seizure disorder, hypertension, stroke, CKD stage IIIb, chronic systolic and diastolic CHF, falls, who presented to the hospital because of altered mental status.  Reportedly, she was yelling at the nursing facility and this was unusual for her.  She was evaluated for a fall on 07/08/2020 and at that time CT head did not show any acute abnormality.  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  Repeat CT head showed that the subdural hematomas were stable.  Case was discussed with neurosurgeon, Dr. Marcell Barlow, and he said that there was no need for follow-up or any further intervention.    Assessment/Plan:   Principal Problem:   Subdural hematoma (HCC) Active Problems:   Hyperkalemia   History of seizure   AMS (altered mental status)   Essential hypertension   History of dementia   Body mass index is 17.77 kg/m.   Acute on chronic subdural hematoma, s/p fall: Repeat CT head on 07/22/2020 is stable.  Discussed with Dr. Marlou Porch, neurosurgeon, via secure chat.  He said there is no need for follow-up or surgery.  PT and OT evaluation.  Delirium/altered mental status: Improved.  Exact etiology unclear.  It could be due to recent fall and subdural hematoma.  Hyperkalemia: Losartan is still on hold.  Treat with Lokelma.  Repeat BMP tomorrow.  CKD stage IIIb: Creatinine is stable.  Hypertension: BP is uncontrolled.  Increase carvedilol from 12.5 to 25 mg twice daily.  Continue amlodipine.  Losartan on hold because of hyperkalemia.  Seizure disorder: Continue Keppra and Vimpat  Dementia: Continue donepezil.  History of stroke: Aspirin has been held because of  subdural hematoma.  Plan to discharge to SNF tomorrow if potassium improves.   Diet Order             Diet Heart Room service appropriate? Yes; Fluid consistency: Thin  Diet effective now                      Consultants: None  Procedures: None    Medications:    allopurinol  100 mg Oral Daily   amLODipine  10 mg Oral Daily   carvedilol  25 mg Oral BID WC   docusate sodium  100 mg Oral BID   donepezil  5 mg Oral Daily   ferrous sulfate  325 mg Oral q morning   lacosamide  200 mg Oral BID   levETIRAcetam  1,000 mg Oral BID   sodium zirconium cyclosilicate  10 g Oral BID   Continuous Infusions:   Anti-infectives (From admission, onward)    None              Family Communication/Anticipated D/C date and plan/Code Status   DVT prophylaxis: SCDs Start: 07/22/20 0003     Code Status: Full Code  Family Communication: None Disposition Plan:    Status is: Observation  The patient will require care spanning > 2 midnights and should be moved to inpatient because: Inpatient level of care appropriate due to severity of illness  Dispo: The patient is from: Home              Anticipated d/c is to: Home  Patient currently is not medically stable to d/c.   Difficult to place patient No           Subjective:   Interval events noted.  No headache or dizziness.  Objective:    Vitals:   07/22/20 1452 07/22/20 2120 07/23/20 0500 07/23/20 0828  BP: (!) 159/88 (!) 157/105 (!) 153/88 (!) 156/98  Pulse: 78 84 (!) 108 86  Resp: 18 18 16 16   Temp: 98 F (36.7 C) 98.3 F (36.8 C) 98 F (36.7 C) 98.1 F (36.7 C)  TempSrc:    Oral  SpO2: 100% 100% 99% 100%  Weight:      Height:       No data found.   Intake/Output Summary (Last 24 hours) at 07/23/2020 1156 Last data filed at 07/22/2020 1809 Gross per 24 hour  Intake 60 ml  Output --  Net 60 ml   Filed Weights   07/21/20 1847  Weight: 41.3 kg    Exam:  GEN:  NAD SKIN: No rash EYES: EOMI ENT: MMM CV: RRR PULM: CTA B ABD: soft, ND, NT, +BS CNS: AAO x 3, non focal EXT: No edema or tenderness        Data Reviewed:   I have personally reviewed following labs and imaging studies:  Labs: Labs show the following:   Basic Metabolic Panel: Recent Labs  Lab 07/21/20 1935 07/23/20 0536  NA 135 131*  K 5.8* 5.6*  CL 107 99  CO2 22 24  GLUCOSE 114* 87  BUN 48* 43*  CREATININE 1.52* 1.49*  CALCIUM 9.5 9.5   GFR Estimated Creatinine Clearance: 20.6 mL/min (A) (by C-G formula based on SCr of 1.49 mg/dL (H)). Liver Function Tests: Recent Labs  Lab 07/21/20 1935  AST 17  ALT 14  ALKPHOS 68  BILITOT 0.4  PROT 7.9  ALBUMIN 3.9   No results for input(s): LIPASE, AMYLASE in the last 168 hours. Recent Labs  Lab 07/21/20 1935  AMMONIA 15   Coagulation profile Recent Labs  Lab 07/21/20 1935  INR 1.0    CBC: Recent Labs  Lab 07/21/20 1935 07/23/20 0536  WBC 7.6 6.0  NEUTROABS 5.6  --   HGB 9.5* 11.5*  HCT 28.2* 34.9*  MCV 99.6 99.1  PLT 270 340   Cardiac Enzymes: No results for input(s): CKTOTAL, CKMB, CKMBINDEX, TROPONINI in the last 168 hours. BNP (last 3 results) No results for input(s): PROBNP in the last 8760 hours. CBG: Recent Labs  Lab 07/21/20 1956  GLUCAP 98   D-Dimer: No results for input(s): DDIMER in the last 72 hours. Hgb A1c: No results for input(s): HGBA1C in the last 72 hours. Lipid Profile: No results for input(s): CHOL, HDL, LDLCALC, TRIG, CHOLHDL, LDLDIRECT in the last 72 hours. Thyroid function studies: No results for input(s): TSH, T4TOTAL, T3FREE, THYROIDAB in the last 72 hours.  Invalid input(s): FREET3 Anemia work up: No results for input(s): VITAMINB12, FOLATE, FERRITIN, TIBC, IRON, RETICCTPCT in the last 72 hours. Sepsis Labs: Recent Labs  Lab 07/21/20 1935 07/23/20 0536  WBC 7.6 6.0    Microbiology Recent Results (from the past 240 hour(s))  SARS CORONAVIRUS 2 (TAT  6-24 HRS) Nasopharyngeal Nasopharyngeal Swab     Status: None   Collection Time: 07/22/20  1:52 PM   Specimen: Nasopharyngeal Swab  Result Value Ref Range Status   SARS Coronavirus 2 NEGATIVE NEGATIVE Final    Comment: (NOTE) SARS-CoV-2 target nucleic acids are NOT DETECTED.  The SARS-CoV-2 RNA is generally detectable  in upper and lower respiratory specimens during the acute phase of infection. Negative results do not preclude SARS-CoV-2 infection, do not rule out co-infections with other pathogens, and should not be used as the sole basis for treatment or other patient management decisions. Negative results must be combined with clinical observations, patient history, and epidemiological information. The expected result is Negative.  Fact Sheet for Patients: HairSlick.no  Fact Sheet for Healthcare Providers: quierodirigir.com  This test is not yet approved or cleared by the [place] FDA and  has been authorized for detection and/or diagnosis of SARS-CoV-2 by FDA under an Emergency Use Authorization (EUA). This EUA will remain  in effect (meaning this test can be used) for the duration of the COVID-19 declaration under Se ction 564(b)(1) of the Act, 21 U.S.C. section 360bbb-3(b)(1), unless the authorization is terminated or revoked sooner.  Performed at [facility], [address], [place], [place] [address]   MRSA PCR Screening     Status: None   Collection Time: 07/22/20  6:43 PM  Result Value Ref Range Status   MRSA by PCR NEGATIVE NEGATIVE Final    Comment:        The GeneXpert MRSA Assay (FDA approved for NASAL specimens only), is one component of a comprehensive MRSA colonization surveillance program. It is not intended to diagnose MRSA infection nor to guide or monitor treatment for MRSA infections. Performed at [facility], [address] [place], [place] [address]     Procedures  and diagnostic studies:  CT HEAD WO CONTRAST  Result Date: 07/22/2020 CLINICAL DATA:  Subdural hematoma EXAM: CT HEAD WITHOUT CONTRAST TECHNIQUE: Contiguous axial images were obtained from the base of the skull through the vertex without intravenous contrast. Sagittal and coronal MPR images reconstructed from axial data set. COMPARISON:  07/21/2020 FINDINGS: Brain: Generalized atrophy. Normal ventricular morphology. Small vessel chronic ischemic changes of deep cerebral white matter. Subacute LEFT frontal subdural hematoma up to 7 mm thick on coronal imaging, unchanged. Small focus of high attenuation acute subdural hemorrhage is seen more superiorly and posteriorly, unchanged. Old infarcts RIGHT basal ganglia and questionably RIGHT thalamus. No mass lesion or new areas of infarction. 2 mm of LEFT-to-RIGHT midline shift. Vascular: Atherosclerotic calcification of internal carotid arteries at skull base. No hyperdense vessels. Skull: Intact. Sinuses/Orbits: Chronic mucosal thickening and osseous thickening of sphenoid sinus likely reflects sequela of chronic sinusitis. Remaining paranasal sinuses and mastoid air cells clear Other: N/A IMPRESSION: Atrophy with small vessel chronic ischemic changes of deep cerebral white matter. Old lacunar infarcts RIGHT basal ganglia and questionably RIGHT thalamus. Acute on chronic subdural hematoma LEFT frontal parietal unchanged, with 2 mm of LEFT-to-RIGHT midline shift. No new intracranial abnormalities. Electronically Signed   By: Ulyses Southward M.D.   On: 07/22/2020 11:38   CT HEAD WO CONTRAST  Result Date: 07/21/2020 CLINICAL DATA:  Delirium.  Altered mental status. EXAM: CT HEAD WITHOUT CONTRAST TECHNIQUE: Contiguous axial images were obtained from the base of the skull through the vertex without intravenous contrast. COMPARISON:  Head CT 07/08/2020 FINDINGS: Brain: There are bilateral subdural collections. On the left this measures 8-9 mm, and is diminished from prior  exam where it measured up to 11 mm. There is a small high-density component consistent with acute hemorrhage, series 4, image 31. Small right frontal subdural collection measures up to 5 mm and has a acute or subacute hemorrhagic component. This is also diminished in size fall previously measuring 8 mm. There is slight mass  effect on the underlying brain parenchyma. Underlying atrophy and chronic small vessel ischemia are again seen. Small remote chronic infarcts right basal ganglia, bilateral thalami, and high left parietal lobe, unchanged. No evidence of acute ischemia. No hydrocephalus. Vascular: Atherosclerosis of skullbase vasculature without hyperdense vessel or abnormal calcification. Skull: No fracture or focal lesion. Sinuses/Orbits: Chronic mucosal thickening of left side of sphenoid sinus with cortical thickening. No acute findings. Other: Diminishing frontal scalp hematoma from prior. IMPRESSION: 1. Bilateral subdural collections, left greater than right, both of which have diminished in size from prior exam. These may represent subdural hygromas or chronic subdural hematomas, however there is a small amount of acute blood within both the right and left collection. There is mild underlying mass effect on the brain parenchyma. 2. Atrophy and chronic small vessel ischemia. Multiple remote chronic infarcts. These results were called by telephone at the time of interpretation on 07/21/2020 at 7:24 pm to provider MARK QUALE , who verbally acknowledged these results. Electronically Signed   By: Narda Rutherford M.D.   On: 07/21/2020 19:24               LOS: 0 days   Marlo Goodrich  [facility]   Pager on [email or web address]. If 7PM-7AM, please contact night-coverage at [email or web address]     07/23/2020, 11:56 AM

## 2020-07-24 DIAGNOSIS — E875 Hyperkalemia: Secondary | ICD-10-CM | POA: Diagnosis not present

## 2020-07-24 DIAGNOSIS — N189 Chronic kidney disease, unspecified: Secondary | ICD-10-CM | POA: Diagnosis not present

## 2020-07-24 DIAGNOSIS — N179 Acute kidney failure, unspecified: Secondary | ICD-10-CM | POA: Diagnosis not present

## 2020-07-24 DIAGNOSIS — S065X9A Traumatic subdural hemorrhage with loss of consciousness of unspecified duration, initial encounter: Secondary | ICD-10-CM | POA: Diagnosis not present

## 2020-07-24 DIAGNOSIS — I10 Essential (primary) hypertension: Secondary | ICD-10-CM | POA: Diagnosis not present

## 2020-07-24 LAB — BASIC METABOLIC PANEL
Anion gap: 8 (ref 5–15)
BUN: 63 mg/dL — ABNORMAL HIGH (ref 8–23)
CO2: 23 mmol/L (ref 22–32)
Calcium: 9.5 mg/dL (ref 8.9–10.3)
Chloride: 106 mmol/L (ref 98–111)
Creatinine, Ser: 2.12 mg/dL — ABNORMAL HIGH (ref 0.44–1.00)
GFR, Estimated: 24 mL/min — ABNORMAL LOW (ref >60)
Glucose, Bld: 99 mg/dL (ref 70–99)
Potassium: 5 mmol/L (ref 3.5–5.1)
Sodium: 137 mmol/L (ref 135–145)

## 2020-07-24 LAB — GLUCOSE, CAPILLARY: Glucose-Capillary: 175 mg/dL — ABNORMAL HIGH (ref 70–99)

## 2020-07-24 MED ORDER — LACTATED RINGERS IV SOLN: INTRAVENOUS | Status: AC

## 2020-07-24 MED ORDER — ACETAMINOPHEN 325 MG PO TABS
650.0000 mg | ORAL_TABLET | Freq: Four times a day (QID) | ORAL | Status: DC | PRN
  Administered 2020-07-24: 21:00:00 650 mg via ORAL

## 2020-07-24 MED ORDER — LEVETIRACETAM 500 MG PO TABS
1000.0000 mg | ORAL_TABLET | Freq: Two times a day (BID) | ORAL | Status: DC
  Administered 2020-07-24 – 2020-07-29 (×11): 1000 mg via ORAL
  Filled 2020-07-24 (×13): qty 2

## 2020-07-24 NOTE — Evaluation (Signed)
Physical Therapy Evaluation Patient Details Name: [name] MRN: [id number] DOB: [birth year] Today's Date: 07/24/2020   History of Present Illness  80 y.o. female history of multiple chronic medical conditions including dementia, vascular dementia, encephalopathy pneumonia seizure disorder etc. New acute on chronic L parietal with midline shift.  Clinical Impression  Pt alert, mood flat w/ intermittent smiles during conversation; oriented to self and place, follows 1-step commands; pt unable to provide history due to AMS, partner noted PLOF included ambulation with hand-held or wall support. UE strength WFL, LE strength at least 3/5 from AROM, significant LE atrophy noted. Standing balance and ambulation require max assist due to left lateral trunk lean, retroversion with static stance. Decreased endurance with upright activities and requires total assist with increasing fatigue levels. Current discharge recommendations are SNF with supervision due to decreased functional mobillity and high fall risk. Pt would benefit from skilled PT for improving independence with ADLs and to increase strength necessary for mobility.    Follow Up Recommendations SNF;Supervision/Assistance - 24 hour    Equipment Recommendations  None recommended by PT    Recommendations for Other Services       Precautions / Restrictions Precautions Precautions: Fall Restrictions Weight Bearing Restrictions: No      Mobility  Bed Mobility Overal bed mobility: Needs Assistance Bed Mobility: Supine to Sit     Supine to sit: Mod assist Sit to supine: Mod assist        Transfers Overall transfer level: Needs assistance Equipment used: Rolling walker (2 wheeled) Transfers: Sit to/from Stand Sit to Stand: Mod assist         General transfer comment: retroversion standing  Ambulation/Gait Ambulation/Gait assistance: Max assist;+2 physical assistance Gait Distance (Feet): 2 Feet Assistive  device: Rolling walker (2 wheeled) Gait Pattern/deviations: Staggering left;Step-to pattern     General Gait Details: Moderate L trunk lean, responds to cues for foot placement, decreased task tolerance, RW for support w/ assist for movement  Stairs            Wheelchair Mobility    Modified Rankin (Stroke Patients Only)       Balance Overall balance assessment: Needs assistance Sitting-balance support: Feet supported;Single extremity supported Sitting balance-Leahy Scale: Fair Sitting balance - Comments: L lateral trunk lean, LUE supports WB, Postural control: Left lateral lean;Posterior lean Standing balance support: During functional activity;Bilateral upper extremity supported Standing balance-Leahy Scale: Poor Standing balance comment: Holds onto RW, falls L, intermittent with stability requiring mod - max assist                             Pertinent Vitals/Pain Pain Assessment: No/denies pain Pain Score: 0-No pain Faces Pain Scale: No hurt    Home Living Family/patient expects to be discharged to:: Skilled nursing facility                      Prior Function Level of Independence: Needs assistance               Hand Dominance   Dominant Hand: Left    Extremity/Trunk Assessment   Upper Extremity Assessment Upper Extremity Assessment: Generalized weakness (BUE 4/5 MMT w/ elbow flex, extension; AROM bilat. shoulder flex to 90;)    Lower Extremity Assessment Lower Extremity Assessment: Generalized weakness (BLE 4/5 hip flexion, knee extension, knee flexion w/ L slightly weaker than R; Alternate toe taps: negative)       Communication  Communication: No difficulties  Cognition Arousal/Alertness: Awake/alert Behavior During Therapy: Flat affect;WFL for tasks assessed/performed Overall Cognitive Status: History of cognitive impairments - at baseline                                 General Comments: Partner  confirmed current presentation as cognitive baseline. Oriented to person, place; able to follow 1-step commands      General Comments      Exercises     Assessment/Plan    PT Assessment Patient needs continued PT services  PT Problem List Decreased strength;Decreased cognition;Decreased range of motion;Decreased activity tolerance;Decreased balance;Decreased mobility;Decreased coordination;Decreased safety awareness       PT Treatment Interventions Gait training;Functional mobility training;Therapeutic activities;Therapeutic exercise;Neuromuscular re-education;Balance training    PT Goals (Current goals can be found in the Care Plan section)       Frequency Min 2X/week   Barriers to discharge        Co-evaluation               AM-PAC PT "6 Clicks" Mobility  Outcome Measure Help needed turning from your back to your side while in a flat bed without using bedrails?: A Lot Help needed moving from lying on your back to sitting on the side of a flat bed without using bedrails?: A Lot Help needed moving to and from a bed to a chair (including a wheelchair)?: A Lot Help needed standing up from a chair using your arms (e.g., wheelchair or bedside chair)?: A Lot Help needed to walk in hospital room?: Total Help needed climbing 3-5 steps with a railing? : Total 6 Click Score: 10    End of Session Equipment Utilized During Treatment: Gait belt Activity Tolerance: Patient tolerated treatment well Patient left: in bed;with call bell/phone within reach;with bed alarm set;with family/visitor present   PT Visit Diagnosis: Unsteadiness on feet (R26.81);Other abnormalities of gait and mobility (R26.89);Muscle weakness (generalized) (M62.81);History of falling (Z91.81);Difficulty in walking, not elsewhere classified (R26.2);Adult, failure to thrive (R62.7)    Time: 5638-7564 PT Time Calculation (min) (ACUTE ONLY): 34 min   Charges:            [employer], SPT

## 2020-07-24 NOTE — Progress Notes (Signed)
Progress Note    [name]  XKG:[id number] DOB: 1941-01-19  DOA: 07/21/2020 PCP: [name], MD      Brief Narrative:    Medical records reviewed and are as summarized below:  [name] is a 80 y.o. female with medical history significant for dementia, seizure disorder, hypertension, stroke, CKD stage IIIb, chronic systolic and diastolic CHF, falls, who presented to the hospital because of altered mental status.  Reportedly, she was yelling at the nursing facility and this was unusual for her.  She was evaluated for a fall on 07/08/2020 and at that time CT head did not show any acute abnormality.  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  Repeat CT head showed that the subdural hematomas were stable.  Case was discussed with neurosurgeon, Dr. Marcell Barlow, and he said that there was no need for follow-up or any further intervention.    Assessment/Plan:   Principal Problem:   Subdural hematoma (HCC) Active Problems:   Hyperkalemia   History of seizure   AMS (altered mental status)   Essential hypertension   History of dementia   Body mass index is 17.77 kg/m.   Acute on chronic subdural hematoma, s/p fall: Repeat CT head on 07/22/2020 is stable.  Discussed with Dr. Marlou Porch, neurosurgeon, via secure chat on 07/22/2020.  He said there is no need for follow-up or surgery.  PT and OT evaluation.  Delirium/altered mental status: Improved.  Exact etiology unclear.  It could be due to recent fall and subdural hematoma.  Hyperkalemia: Losartan is still on hold.  Improved.  AKI on CKD stage IIIb: Creatinine is worse.  Start IV fluids and repeat BMP tomorrow.  Hypertension: BP is better.  Continue carvedilol and amlodipine.  Continue to hold losartan for now given AKI.  Seizure disorder: Continue Keppra and Vimpat  Dementia: Continue donepezil.  History of stroke: Aspirin has been held because of subdural  hematoma.  Plan to discharge to SNF tomorrow if AKI improves.   Diet Order             Diet Heart Room service appropriate? Yes; Fluid consistency: Thin  Diet effective now                      Consultants: None  Procedures: None    Medications:    allopurinol  100 mg Oral Daily   amLODipine  10 mg Oral Daily   carvedilol  25 mg Oral BID WC   docusate sodium  100 mg Oral BID   donepezil  5 mg Oral Daily   feeding supplement (NEPRO CARB STEADY)  237 mL Oral BID BM   ferrous sulfate  325 mg Oral q morning   lacosamide  200 mg Oral BID   levETIRAcetam  1,000 mg Oral BID   sodium zirconium cyclosilicate  10 g Oral BID   Continuous Infusions:   Anti-infectives (From admission, onward)    None              Family Communication/Anticipated D/C date and plan/Code Status   DVT prophylaxis: SCDs Start: 07/22/20 0003     Code Status: Full Code  Family Communication: Charles, significant other and Samantha, friend, at the bedside Disposition Plan:    Status is: Observation  The patient will require care spanning > 2 midnights and should be moved to inpatient because: Inpatient level of care appropriate due to severity of illness  Dispo: The patient  is from: Home              Anticipated d/c is to: Home              Patient currently is not medically stable to d/c.   Difficult to place patient No           Subjective:   Interval events noted.  She said she had no complaints.  PT/OT was at the bedside.  She was transferred from bed to chair and then back to the bed.  Shortly after this session, her significant other and friend called that patient was gasping for air and he said that she was more confused and "disengaged".  Objective:    Vitals:   07/23/20 2317 07/24/20 0404 07/24/20 0723 07/24/20 1158  BP: 95/68 137/79 137/87 119/73  Pulse: 89 81 79 64  Resp: 18 16 16 18   Temp: 98.2 F (36.8 C) 97.7 F (36.5 C) 98 F (36.7 C)  97.6 F (36.4 C)  TempSrc:  Oral  Oral  SpO2: 99% 99% 99% 100%  Weight:      Height:       No data found.   Intake/Output Summary (Last 24 hours) at 07/24/2020 1227 Last data filed at 07/24/2020 07/26/2020 Gross per 24 hour  Intake 357 ml  Output --  Net 357 ml   Filed Weights   07/21/20 1847  Weight: 41.3 kg    Exam:  GEN: NAD SKIN: No rash EYES: EOMI ENT: MMM CV: RRR PULM: CTA B ABD: soft, ND, NT, +BS CNS: AAO x 2 (person and place), non focal EXT: No edema or tenderness         Data Reviewed:   I have personally reviewed following labs and imaging studies:  Labs: Labs show the following:   Basic Metabolic Panel: Recent Labs  Lab 07/21/20 1935 07/23/20 0536 07/24/20 0523  NA 135 131* 137  K 5.8* 5.6* 5.0  CL 107 99 106  CO2 22 24 23   GLUCOSE 114* 87 99  BUN 48* 43* 63*  CREATININE 1.52* 1.49* 2.12*  CALCIUM 9.5 9.5 9.5   GFR Estimated Creatinine Clearance: 14.5 mL/min (A) (by C-G formula based on SCr of 2.12 mg/dL (H)). Liver Function Tests: Recent Labs  Lab 07/21/20 1935  AST 17  ALT 14  ALKPHOS 68  BILITOT 0.4  PROT 7.9  ALBUMIN 3.9   No results for input(s): LIPASE, AMYLASE in the last 168 hours. Recent Labs  Lab 07/21/20 1935  AMMONIA 15   Coagulation profile Recent Labs  Lab 07/21/20 1935  INR 1.0    CBC: Recent Labs  Lab 07/21/20 1935 07/23/20 0536  WBC 7.6 6.0  NEUTROABS 5.6  --   HGB 9.5* 11.5*  HCT 28.2* 34.9*  MCV 99.6 99.1  PLT 270 340   Cardiac Enzymes: No results for input(s): CKTOTAL, CKMB, CKMBINDEX, TROPONINI in the last 168 hours. BNP (last 3 results) No results for input(s): PROBNP in the last 8760 hours. CBG: Recent Labs  Lab 07/21/20 1956 07/24/20 1200  GLUCAP 98 175*   D-Dimer: No results for input(s): DDIMER in the last 72 hours. Hgb A1c: No results for input(s): HGBA1C in the last 72 hours. Lipid Profile: No results for input(s): CHOL, HDL, LDLCALC, TRIG, CHOLHDL, LDLDIRECT in the last  72 hours. Thyroid function studies: No results for input(s): TSH, T4TOTAL, T3FREE, THYROIDAB in the last 72 hours.  Invalid input(s): FREET3 Anemia work up: No results for input(s): VITAMINB12, FOLATE, FERRITIN,  TIBC, IRON, RETICCTPCT in the last 72 hours. Sepsis Labs: Recent Labs  Lab 07/21/20 1935 07/23/20 0536  WBC 7.6 6.0    Microbiology Recent Results (from the past 240 hour(s))  SARS CORONAVIRUS 2 (TAT 6-24 HRS) Nasopharyngeal Nasopharyngeal Swab     Status: None   Collection Time: 07/22/20  1:52 PM   Specimen: Nasopharyngeal Swab  Result Value Ref Range Status   SARS Coronavirus 2 NEGATIVE NEGATIVE Final    Comment: (NOTE) SARS-CoV-2 target nucleic acids are NOT DETECTED.  The SARS-CoV-2 RNA is generally detectable in upper and lower respiratory specimens during the acute phase of infection. Negative results do not preclude SARS-CoV-2 infection, do not rule out co-infections with other pathogens, and should not be used as the sole basis for treatment or other patient management decisions. Negative results must be combined with clinical observations, patient history, and epidemiological information. The expected result is Negative.  Fact Sheet for Patients: HairSlick.no  Fact Sheet for Healthcare Providers: quierodirigir.com  This test is not yet approved or cleared by the [place] FDA and  has been authorized for detection and/or diagnosis of SARS-CoV-2 by FDA under an Emergency Use Authorization (EUA). This EUA will remain  in effect (meaning this test can be used) for the duration of the COVID-19 declaration under Se ction 564(b)(1) of the Act, 21 U.S.C. section 360bbb-3(b)(1), unless the authorization is terminated or revoked sooner.  Performed at [facility], [address], [place], [place] [address]   MRSA PCR Screening     Status: None   Collection Time: 07/22/20  6:43 PM  Result Value  Ref Range Status   MRSA by PCR NEGATIVE NEGATIVE Final    Comment:        The GeneXpert MRSA Assay (FDA approved for NASAL specimens only), is one component of a comprehensive MRSA colonization surveillance program. It is not intended to diagnose MRSA infection nor to guide or monitor treatment for MRSA infections. Performed at [facility], [address] [place], [place] [address]     Procedures and diagnostic studies:  No results found.             LOS: 0 days   Harinder Romas  [facility]   Pager on [email or web address]. If 7PM-7AM, please contact night-coverage at [email or web address]     07/24/2020, 12:27 PM

## 2020-07-24 NOTE — TOC Progression Note (Signed)
Transition of Care [facility]) - Progression Note    Patient Details  Name: [name] MRN: [id number] Date of Birth: [birth year]  Transition of Care [facility]) CM/SW Contact  Caryn Section, RN Phone Number: 07/24/2020, 3:13 PM  Clinical Narrative:  As per Archie Patten at [facility], patient can return.  Patient will need COVID test as her last one was 22Jun2022.  Contact [place] prior to discharge.         Expected Discharge Plan and Services                                                 Social Determinants of Health (SDOH) Interventions    Readmission Risk Interventions No flowsheet data found.

## 2020-07-25 DIAGNOSIS — S065X9A Traumatic subdural hemorrhage with loss of consciousness of unspecified duration, initial encounter: Secondary | ICD-10-CM | POA: Diagnosis not present

## 2020-07-25 DIAGNOSIS — E875 Hyperkalemia: Secondary | ICD-10-CM | POA: Diagnosis not present

## 2020-07-25 DIAGNOSIS — I10 Essential (primary) hypertension: Secondary | ICD-10-CM | POA: Diagnosis not present

## 2020-07-25 DIAGNOSIS — N189 Chronic kidney disease, unspecified: Secondary | ICD-10-CM | POA: Diagnosis not present

## 2020-07-25 DIAGNOSIS — N179 Acute kidney failure, unspecified: Secondary | ICD-10-CM | POA: Diagnosis not present

## 2020-07-25 LAB — BASIC METABOLIC PANEL
Anion gap: 8 (ref 5–15)
BUN: 48 mg/dL — ABNORMAL HIGH (ref 8–23)
CO2: 24 mmol/L (ref 22–32)
Calcium: 9.1 mg/dL (ref 8.9–10.3)
Chloride: 105 mmol/L (ref 98–111)
Creatinine, Ser: 1.63 mg/dL — ABNORMAL HIGH (ref 0.44–1.00)
GFR, Estimated: 32 mL/min — ABNORMAL LOW (ref >60)
Glucose, Bld: 86 mg/dL (ref 70–99)
Potassium: 3.7 mmol/L (ref 3.5–5.1)
Sodium: 137 mmol/L (ref 135–145)

## 2020-07-25 LAB — RESP PANEL BY RT-PCR (FLU A&B, COVID) ARPGX2
Influenza A by PCR: NEGATIVE
Influenza B by PCR: NEGATIVE
SARS Coronavirus 2 by RT PCR: NEGATIVE

## 2020-07-25 LAB — SARS CORONAVIRUS 2 (TAT 6-24 HRS): SARS Coronavirus 2: NEGATIVE

## 2020-07-25 NOTE — Progress Notes (Signed)
Occupational Therapy Treatment Patient Details Name: [name] MRN: [id number] DOB: 08-03-1940 Today's Date: 07/25/2020    History of present illness 80 y.o. female presented to ED for AMS, history of multiple chronic medical conditions including dementia, vascular dementia, encephalopathy pneumonia seizure disorder etc. New acute on chronic L parietal with midline shift.   OT comments  Patient presenting  this date with improved supine to sit with min A and improved sitting balance on EOB participating in UB ROM and use - Toilet transfers were Mod A but hygiene in sitting Independent with setup. Participated in LB dressing in supine with OT demo or initiating the task. Standing balance and functional mobility decrease with decrease LE strength but able to stand for 2-3 min. Pt cont to show decreased I in self care, balance, functional mobility/transfers, endurance, and safety awareness. Per chart review, pt with multiple admissions and prior to April 2022 she was living at home independently with use of AD as needed PTA. In April 2022, pt with hip fx and then admitted to SNF for rehab with possibility for LTC placement per chart. Pt oriented to self and location.  Patient will benefit from acute OT to increase overall independence in the areas of ADLs, functional mobility, and safety awareness in order to safely discharge to next venue of care  Follow Up Recommendations  SNF;Supervision/Assistance - 24 hour    Equipment Recommendations  Other (comment)    Recommendations for Other Services      Precautions / Restrictions Precautions Precautions: Fall       Mobility Bed Mobility                    Transfers                      Balance                                           ADL either performed or assessed with clinical judgement   ADL                                               Vision        Perception     Praxis      Cognition Arousal/Alertness: Awake/alert   Overall Cognitive Status: History of cognitive impairments - at baseline                                 General Comments: Partner confirmed current presentation as cognitive baseline. Oriented to person, place; able to follow 1-step commands        Exercises Other Exercises Other Exercises: Pt supine in bed- donning and doffing socks SBA to demo - and she could do it with no pain Other Exercises: supine <> sit Min A to SBA, sit<> stand min A with bed raised and used walker - standing static with walker good, but hard time to march and take step to sink and sideways along bed Other Exercises: Toilet transfer Mod A - hygiene in sitting with setup- pt independent - no LOB Other Exercises: sitting balance on EOB  good- unsupported and participated in UE activities, standing poor  Shoulder Instructions       General Comments      Pertinent Vitals/ Pain       Pain Assessment: No/denies pain  Home Living Family/patient expects to be discharged to:: Skilled nursing facility                                        Prior Functioning/Environment Level of Independence: Needs assistance  Gait / Transfers Assistance Needed: Per chart and OT: Pt has had several recent admissions and in april had fall with hip fx and discharged to SNF for rehab. Prior to this per chart review, she was independent at home. ADL's / Homemaking Assistance Needed: Pt was independent prior to hip fx in april. Needing assist since that time. Communication / Swallowing Assistance Needed: no difficulties     Frequency  Min 2X/week        Progress Toward Goals  OT Goals(current goals can now be found in the care plan section)     Acute Rehab OT Goals Patient Stated Goal: Want to go back to [place] care OT Goal Formulation: With patient Time For Goal Achievement: 08/06/20 Potential to Achieve Goals:  Fair  Plan      Co-evaluation                 AM-PAC OT "6 Clicks" Daily Activity     Outcome Measure                    End of Session Equipment Utilized During Treatment: Gait belt  OT Visit Diagnosis: Unsteadiness on feet (R26.81);Repeated falls (R29.6);Hemiplegia and hemiparesis   Activity Tolerance Patient tolerated treatment well   Patient Left in bed;with call bell/phone within reach;with bed alarm set   Nurse Communication Mobility status        Time: 0900-0930 OT Time Calculation (min): 30 min  Charges: OT General Charges $OT Visit: 1 Visit OT Treatments $Self Care/Home Management : 23-37 mins  Progressing towards goals   Oletta Cohn OTR/L,CLT 07/25/2020, 1:40 PM

## 2020-07-25 NOTE — TOC Initial Note (Signed)
Transition of Care [facility]) - Initial/Assessment Note    Patient Details  Name: [name] MRN: [id number] Date of Birth: Aug 25, 1940  Transition of Care [facility]) CM/SW Contact:    Allayne Butcher, RN Phone Number: 07/25/2020, 12:28 PM  Clinical Narrative:                 Patient from [facility], there for short term rehab but may end up being long term care.  Patient is medically cleared for discharge but patient needs insurance authorization to return for rehab.  Tanya at [place] will start insurance auth as patient is managed by [facility] and not [facility].  Doubtful that insurance authorization will be approved today over the weekend.    Expected Discharge Plan: Skilled Nursing Facility Barriers to Discharge: Insurance Authorization   Patient Goals and CMS Choice Patient states their goals for this hospitalization and ongoing recovery are:: plan to return to [facility] CMS Medicare.gov Compare Post Acute Care list provided to:: Legal Guardian Choice offered to / list presented to : [facility] POA / Guardian  Expected Discharge Plan and Services Expected Discharge Plan: Skilled Nursing Facility   Discharge Planning Services: CM Consult Post Acute Care Choice: Skilled Nursing Facility   Expected Discharge Date: 07/25/20               DME Arranged: N/A DME Agency: NA       HH Arranged: NA          Prior Living Arrangements/Services   Lives with:: Facility Resident Patient language and need for interpreter reviewed:: Yes Do you feel safe going back to the place where you live?: Yes      Need for Family Participation in Patient Care: Yes (Comment) Care giver support system in place?: Yes (comment)   Criminal Activity/Legal Involvement Pertinent to Current Situation/Hospitalization: No - Comment as needed  Activities of Daily Living Home Assistive Devices/Equipment: None ADL Screening (condition at time of admission) Patient's cognitive  ability adequate to safely complete daily activities?: Yes Is the patient deaf or have difficulty hearing?: No Does the patient have difficulty seeing, even when wearing glasses/contacts?: No Does the patient have difficulty concentrating, remembering, or making decisions?: Yes Patient able to express need for assistance with ADLs?: Yes Does the patient have difficulty dressing or bathing?: No Independently performs ADLs?: Yes (appropriate for developmental age) Does the patient have difficulty walking or climbing stairs?: Yes Weakness of Legs: Both Weakness of Arms/Hands: None  Permission Sought/Granted Permission sought to share information with : Case Manager, Guardian, Magazine features editor Permission granted to share information with : Yes, Verbal Permission Granted  Share Information with NAME: Lanae  Permission granted to share info w AGENCY: Designer, television/film set  Permission granted to share info w Relationship: legal guardian     Emotional Assessment       Orientation: : Oriented to Self, Oriented to Situation Alcohol / Substance Use: Not Applicable Psych Involvement: No (comment)  Admission diagnosis:  Subdural hematoma (HCC) [S06.5X9A] Altered mental status, unspecified altered mental status type [R41.82] Patient Active Problem List   Diagnosis Date Noted   Subdural hematoma (HCC) 07/22/2020   History of seizure 07/22/2020   AMS (altered mental status) 07/22/2020   Essential hypertension 07/22/2020   History of dementia 07/22/2020   Coronary artery disease involving native coronary artery of native heart with angina pectoris (HCC) 05/12/2020   Closed fracture of left hip (HCC) 03/27/2020   SIRS (systemic inflammatory response syndrome) (HCC) 03/27/2020  Intertrochanteric fracture of right femur (HCC) 01/04/2020   Humeral head fracture 01/04/2020   Humeral surgical neck fracture 01/04/2020   Witnessed seizure-like activity (HCC) 12/27/2019   Chronic  kidney disease, stage 3b (HCC) 12/27/2019   Alzheimer's dementia without behavioral disturbance (HCC) 12/27/2019   Gout 12/27/2019   Leukocytosis 12/27/2019   Cough    Encounter for palliative care    Goals of care, counseling/discussion    Pneumonia    NICM (nonischemic cardiomyopathy) -EF 25% 05/20/2015   No significant CAD at cath May 2012 05/20/2015   Anemia 05/20/2015   Diastolic dysfunction-grade 3 05/20/2015   AKI (acute kidney injury) (HCC)    CAP (community acquired pneumonia)    Hyperammonemia (HCC) 05/17/2015   Metabolic encephalopathy 05/16/2015   Acute on chronic renal insufficiency 05/15/2015   Chest pain 05/14/2015   Troponin level elevated 05/14/2015   Chronic combined systolic and diastolic congestive heart failure (HCC) 03/06/2014   Acute combined systolic and diastolic congestive heart failure (HCC) 02/24/2013   Weight gain 02/24/2013   Volume overload 02/24/2013   Hyperglycemia 02/24/2013   Dyspnea 02/24/2013   Constipation 02/24/2013   Abdominal pain 02/24/2013   Pleuritic chest pain 02/24/2013   Epilepsy (HCC)    Smoker    Ataxia, 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   Protein-calorie malnutrition, severe- wgt 86 lbs 02/13/2013   Pneumobilia 02/12/2013   Influenza A 02/11/2013   Hyperkalemia 02/11/2013   Hyponatremia 02/11/2013   HCAP (healthcare-associated pneumonia) 02/11/2013   Liver enzyme elevation 02/11/2013   Basal ganglia infarction (HCC) 01/08/2013   H/O: CVA (cerebrovascular accident) 01/04/2013   Generalized seizure (HCC) 12/28/2012   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   Seizure disorder (HCC) 12/28/2012   Metabolic acidosis 12/28/2012   Encephalopathy 12/28/2012   Acute renal failure (HCC) 12/28/2012   PCP:  [name], MD Pharmacy:   [id number] - [place], [place] - [address] AT [facility] OF [address] & [address] [place] [address] Phone: [phone]  Fax: [phone]  [facility] - [place], [place] - [address] [place] [address] Phone: [phone] Fax: [phone]     Social Determinants of Health (SDOH) Interventions    Readmission Risk Interventions No flowsheet data found.

## 2020-07-25 NOTE — Plan of Care (Signed)
  Problem: Clinical Measurements: Goal: Will remain free from infection Outcome: Progressing Goal: Diagnostic test results will improve Outcome: Progressing   

## 2020-07-25 NOTE — Progress Notes (Signed)
Progress Note    [name]  ZDG:[id number] DOB: [birth year]  DOA: 07/21/2020 PCP: [name], MD      Brief Narrative:    Medical records reviewed and are as summarized below:  [name] is a 80 y.o. female with medical history significant for dementia, seizure disorder, hypertension, stroke, CKD stage IIIb, chronic systolic and diastolic CHF, falls, who presented to the hospital because of altered mental status.  Reportedly, she was yelling at the nursing facility and this was unusual for her.  She was evaluated for a fall on 07/08/2020 and at that time CT head did not show any acute abnormality.  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  Repeat CT head showed that the subdural hematomas were stable.  Case was discussed with neurosurgeon, Dr. Marcell Barlow, and he said that there was no need for follow-up or any further intervention. She had hypokalemia that was treated with [facility]  Losartan has been held.  She developed AKI and this improved with IV fluids.  Her condition is improved and she is deemed stable for discharge to SNF today.  Aspirin has been held because of subdural hematoma.  Aspirin may be resumed at a later date at the discretion of PCP or nursing home physician.     Assessment/Plan:   Principal Problem:   Subdural hematoma (HCC) Active Problems:   Hyperkalemia   AKI (acute kidney injury) (HCC)   History of seizure   AMS (altered mental status)   Essential hypertension   History of dementia   Body mass index is 17.77 kg/m.   Acute on chronic subdural hematoma, s/p fall: Repeat CT head on 07/22/2020 is stable.  Discussed with Dr. Marlou Porch, neurosurgeon, via secure chat on 07/22/2020.  He said there is no need for follow-up or surgery.  PT and OT evaluation.  Delirium/altered mental status: Improved.  Exact etiology unclear.  It could be due to recent fall and subdural hematoma.  Hyperkalemia:  Losartan is still on hold.  Improved.  AKI on CKD stage IIIb: Improved with IV fluids.  Discontinue IV fluids.   Hypertension: BP is better.  Continue carvedilol and amlodipine.    Seizure disorder: Continue Keppra and Vimpat  Dementia: Continue donepezil.  History of stroke: Aspirin has been held because of subdural hematoma.  Discharge to SNF was canceled today because according to [facility], case manager, she will need insurance authorization before she can go back to SNF.   Diet Order             Diet - low sodium heart healthy           Diet Heart Room service appropriate? Yes; Fluid consistency: Thin  Diet effective now                      Consultants: None  Procedures: None    Medications:    allopurinol  100 mg Oral Daily   amLODipine  10 mg Oral Daily   carvedilol  25 mg Oral BID WC   docusate sodium  100 mg Oral BID   donepezil  5 mg Oral Daily   feeding supplement (NEPRO CARB STEADY)  237 mL Oral BID BM   ferrous sulfate  325 mg Oral q morning   lacosamide  200 mg Oral BID   levETIRAcetam  1,000 mg Oral BID   Continuous Infusions:  lactated ringers 75 mL/hr at 07/25/20 0421  Anti-infectives (From admission, onward)    None              Family Communication/Anticipated D/C date and plan/Code Status   DVT prophylaxis: SCDs Start: 07/22/20 0003     Code Status: Full Code  Family Communication: None Disposition Plan:    Status is: Observation  The patient will require care spanning > 2 midnights and should be moved to inpatient because: Inpatient level of care appropriate due to severity of illness  Dispo: The patient is from: Home              Anticipated d/c is to: Home              Patient currently is not medically stable to d/c.   Difficult to place patient No           Subjective:   Interval events noted.  She has no complaints.  She said she feels much better today.  Objective:    Vitals:    07/25/20 0026 07/25/20 0525 07/25/20 0732 07/25/20 1147  BP: 123/70 133/85 130/81 132/80  Pulse: 70 85 84 75  Resp: 16 15 16 15   Temp: 98.4 F (36.9 C) 98.1 F (36.7 C) 97.8 F (36.6 C) 97.9 F (36.6 C)  TempSrc: Oral     SpO2: 100% 98% 99% 100%  Weight:      Height:       No data found.   Intake/Output Summary (Last 24 hours) at 07/25/2020 1237 Last data filed at 07/25/2020 1012 Gross per 24 hour  Intake 1284.75 ml  Output 401 ml  Net 883.75 ml   Filed Weights   07/21/20 1847  Weight: 41.3 kg    Exam:   GEN: NAD SKIN: No rash EYES: EOMI ENT: MMM CV: RRR PULM: CTA B ABD: soft, ND, NT, +BS CNS: AAO x 2 (person and place), non focal EXT: No edema or tenderness        Data Reviewed:   I have personally reviewed following labs and imaging studies:  Labs: Labs show the following:   Basic Metabolic Panel: Recent Labs  Lab 07/21/20 1935 07/23/20 0536 07/24/20 0523 07/25/20 0546  NA 135 131* 137 137  K 5.8* 5.6* 5.0 3.7  CL 107 99 106 105  CO2 22 24 23 24   GLUCOSE 114* 87 99 86  BUN 48* 43* 63* 48*  CREATININE 1.52* 1.49* 2.12* 1.63*  CALCIUM 9.5 9.5 9.5 9.1   GFR Estimated Creatinine Clearance: 18.8 mL/min (A) (by C-G formula based on SCr of 1.63 mg/dL (H)). Liver Function Tests: Recent Labs  Lab 07/21/20 1935  AST 17  ALT 14  ALKPHOS 68  BILITOT 0.4  PROT 7.9  ALBUMIN 3.9   No results for input(s): LIPASE, AMYLASE in the last 168 hours. Recent Labs  Lab 07/21/20 1935  AMMONIA 15   Coagulation profile Recent Labs  Lab 07/21/20 1935  INR 1.0    CBC: Recent Labs  Lab 07/21/20 1935 07/23/20 0536  WBC 7.6 6.0  NEUTROABS 5.6  --   HGB 9.5* 11.5*  HCT 28.2* 34.9*  MCV 99.6 99.1  PLT 270 340   Cardiac Enzymes: No results for input(s): CKTOTAL, CKMB, CKMBINDEX, TROPONINI in the last 168 hours. BNP (last 3 results) No results for input(s): PROBNP in the last 8760 hours. CBG: Recent Labs  Lab 07/21/20 1956 07/24/20 1200   GLUCAP 98 175*   D-Dimer: No results for input(s): DDIMER in the last 72 hours. Hgb A1c:  No results for input(s): HGBA1C in the last 72 hours. Lipid Profile: No results for input(s): CHOL, HDL, LDLCALC, TRIG, CHOLHDL, LDLDIRECT in the last 72 hours. Thyroid function studies: No results for input(s): TSH, T4TOTAL, T3FREE, THYROIDAB in the last 72 hours.  Invalid input(s): FREET3 Anemia work up: No results for input(s): VITAMINB12, FOLATE, FERRITIN, TIBC, IRON, RETICCTPCT in the last 72 hours. Sepsis Labs: Recent Labs  Lab 07/21/20 1935 07/23/20 0536  WBC 7.6 6.0    Microbiology Recent Results (from the past 240 hour(s))  SARS CORONAVIRUS 2 (TAT 6-24 HRS) Nasopharyngeal Nasopharyngeal Swab     Status: None   Collection Time: 07/22/20  1:52 PM   Specimen: Nasopharyngeal Swab  Result Value Ref Range Status   SARS Coronavirus 2 NEGATIVE NEGATIVE Final    Comment: (NOTE) SARS-CoV-2 target nucleic acids are NOT DETECTED.  The SARS-CoV-2 RNA is generally detectable in upper and lower respiratory specimens during the acute phase of infection. Negative results do not preclude SARS-CoV-2 infection, do not rule out co-infections with other pathogens, and should not be used as the sole basis for treatment or other patient management decisions. Negative results must be combined with clinical observations, patient history, and epidemiological information. The expected result is Negative.  Fact Sheet for Patients: HairSlick.no  Fact Sheet for Healthcare Providers: quierodirigir.com  This test is not yet approved or cleared by the [place] FDA and  has been authorized for detection and/or diagnosis of SARS-CoV-2 by FDA under an Emergency Use Authorization (EUA). This EUA will remain  in effect (meaning this test can be used) for the duration of the COVID-19 declaration under Se ction 564(b)(1) of the Act, 21 U.S.C. section  360bbb-3(b)(1), unless the authorization is terminated or revoked sooner.  Performed at [facility], [address], [place], [place] [address]   MRSA PCR Screening     Status: None   Collection Time: 07/22/20  6:43 PM  Result Value Ref Range Status   MRSA by PCR NEGATIVE NEGATIVE Final    Comment:        The GeneXpert MRSA Assay (FDA approved for NASAL specimens only), is one component of a comprehensive MRSA colonization surveillance program. It is not intended to diagnose MRSA infection nor to guide or monitor treatment for MRSA infections. Performed at [facility], [address] [place], [place] [address]     Procedures and diagnostic studies:  No results found.             LOS: 0 days   Tifany Hirsch  [facility]   Pager on [email or web address]. If 7PM-7AM, please contact night-coverage at [email or web address]     07/25/2020, 12:37 PM

## 2020-07-25 NOTE — Discharge Summary (Signed)
Physician Discharge Summary  [name] LFY:[id number] DOB: 08/14/1940 DOA: 07/21/2020  PCP: [name], MD  Admit date: 07/21/2020 Discharge date: 07/25/2020  Discharge disposition: Skilled nursing facility   Recommendations for Outpatient Follow-Up:   Follow-up with physician at the nursing home within 3 days of discharge   Discharge Diagnosis:   Principal Problem:   Subdural hematoma (HCC) Active Problems:   Hyperkalemia   AKI (acute kidney injury) (HCC)   History of seizure   AMS (altered mental status)   Essential hypertension   History of dementia    Discharge Condition: Stable.  Diet recommendation:  Diet Order             Diet - low sodium heart healthy           Diet Heart Room service appropriate? Yes; Fluid consistency: Thin  Diet effective now                     Code Status: Full Code     Hospital Course:   [name] is a 80 y.o. female with medical history significant for dementia, seizure disorder, hypertension, stroke, CKD stage IIIb, chronic systolic and diastolic CHF, falls, who presented to the hospital because of altered mental status.  Reportedly, she was yelling at the nursing facility and this was unusual for her.  She was evaluated for a fall on 07/08/2020 and at that time CT head did not show any acute abnormality.   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  Repeat CT head showed that the subdural hematomas were stable.  Case was discussed with neurosurgeon, Dr. Marcell Barlow, and he said that there was no need for follow-up or any further intervention. She had hypokalemia that was treated with [facility].  Losartan has been held.  She developed AKI and this improved with IV fluids.  Her condition is improved and she is deemed stable for discharge to SNF today.  Aspirin has been held because of subdural hematoma.  Aspirin may be resumed at a later date at the discretion of PCP or nursing  home physician. I called her legal guardian, [name] at [phone] but there was no response.  I left a voice message for her to call back.    Discharge Exam:    Vitals:   07/25/20 0000 07/25/20 0026 07/25/20 0525 07/25/20 0732  BP:  123/70 133/85 130/81  Pulse:  70 85 84  Resp: 17 16 15 16   Temp:  98.4 F (36.9 C) 98.1 F (36.7 C) 97.8 F (36.6 C)  TempSrc:  Oral    SpO2:  100% 98% 99%  Weight:      Height:         GEN: NAD SKIN: No rash EYES: EOMI ENT: MMM CV: RRR PULM: CTA B ABD: soft, ND, NT, +BS CNS: AAO x 2 (person and place), non focal EXT: No edema or tenderness   The results of significant diagnostics from this hospitalization (including imaging, microbiology, ancillary and laboratory) are listed below for reference.     Procedures and Diagnostic Studies:   CT HEAD WO CONTRAST  Result Date: 07/22/2020 CLINICAL DATA:  Subdural hematoma EXAM: CT HEAD WITHOUT CONTRAST TECHNIQUE: Contiguous axial images were obtained from the base of the skull through the vertex without intravenous contrast. Sagittal and coronal MPR images reconstructed from axial data set. COMPARISON:  07/21/2020 FINDINGS: Brain: Generalized atrophy. Normal ventricular morphology. Small vessel chronic ischemic changes of deep  cerebral white matter. Subacute LEFT frontal subdural hematoma up to 7 mm thick on coronal imaging, unchanged. Small focus of high attenuation acute subdural hemorrhage is seen more superiorly and posteriorly, unchanged. Old infarcts RIGHT basal ganglia and questionably RIGHT thalamus. No mass lesion or new areas of infarction. 2 mm of LEFT-to-RIGHT midline shift. Vascular: Atherosclerotic calcification of internal carotid arteries at skull base. No hyperdense vessels. Skull: Intact. Sinuses/Orbits: Chronic mucosal thickening and osseous thickening of sphenoid sinus likely reflects sequela of chronic sinusitis. Remaining paranasal sinuses and mastoid air cells clear  Other: N/A IMPRESSION: Atrophy with small vessel chronic ischemic changes of deep cerebral white matter. Old lacunar infarcts RIGHT basal ganglia and questionably RIGHT thalamus. Acute on chronic subdural hematoma LEFT frontal parietal unchanged, with 2 mm of LEFT-to-RIGHT midline shift. No new intracranial abnormalities. Electronically Signed   By: Ulyses Southward M.D.   On: 07/22/2020 11:38   CT HEAD WO CONTRAST  Result Date: 07/21/2020 CLINICAL DATA:  Delirium.  Altered mental status. EXAM: CT HEAD WITHOUT CONTRAST TECHNIQUE: Contiguous axial images were obtained from the base of the skull through the vertex without intravenous contrast. COMPARISON:  Head CT 07/08/2020 FINDINGS: Brain: There are bilateral subdural collections. On the left this measures 8-9 mm, and is diminished from prior exam where it measured up to 11 mm. There is a small high-density component consistent with acute hemorrhage, series 4, image 31. Small right frontal subdural collection measures up to 5 mm and has a acute or subacute hemorrhagic component. This is also diminished in size fall previously measuring 8 mm. There is slight mass effect on the underlying brain parenchyma. Underlying atrophy and chronic small vessel ischemia are again seen. Small remote chronic infarcts right basal ganglia, bilateral thalami, and high left parietal lobe, unchanged. No evidence of acute ischemia. No hydrocephalus. Vascular: Atherosclerosis of skullbase vasculature without hyperdense vessel or abnormal calcification. Skull: No fracture or focal lesion. Sinuses/Orbits: Chronic mucosal thickening of left side of sphenoid sinus with cortical thickening. No acute findings. Other: Diminishing frontal scalp hematoma from prior. IMPRESSION: 1. Bilateral subdural collections, left greater than right, both of which have diminished in size from prior exam. These may represent subdural hygromas or chronic subdural hematomas, however there is a small amount of acute  blood within both the right and left collection. There is mild underlying mass effect on the brain parenchyma. 2. Atrophy and chronic small vessel ischemia. Multiple remote chronic infarcts. These results were called by telephone at the time of interpretation on 07/21/2020 at 7:24 pm to provider MARK QUALE , who verbally acknowledged these results. Electronically Signed   By: Narda Rutherford M.D.   On: 07/21/2020 19:24     Labs:   Basic Metabolic Panel: Recent Labs  Lab 07/21/20 1935 07/23/20 0536 07/24/20 0523 07/25/20 0546  NA 135 131* 137 137  K 5.8* 5.6* 5.0 3.7  CL 107 99 106 105  CO2 22 24 23 24   GLUCOSE 114* 87 99 86  BUN 48* 43* 63* 48*  CREATININE 1.52* 1.49* 2.12* 1.63*  CALCIUM 9.5 9.5 9.5 9.1   GFR Estimated Creatinine Clearance: 18.8 mL/min (A) (by C-G formula based on SCr of 1.63 mg/dL (H)). Liver Function Tests: Recent Labs  Lab 07/21/20 1935  AST 17  ALT 14  ALKPHOS 68  BILITOT 0.4  PROT 7.9  ALBUMIN 3.9   No results for input(s): LIPASE, AMYLASE in the last 168 hours. Recent Labs  Lab 07/21/20 1935  AMMONIA 15   Coagulation profile  Recent Labs  Lab 07/21/20 1935  INR 1.0    CBC: Recent Labs  Lab 07/21/20 1935 07/23/20 0536  WBC 7.6 6.0  NEUTROABS 5.6  --   HGB 9.5* 11.5*  HCT 28.2* 34.9*  MCV 99.6 99.1  PLT 270 340   Cardiac Enzymes: No results for input(s): CKTOTAL, CKMB, CKMBINDEX, TROPONINI in the last 168 hours. BNP: Invalid input(s): POCBNP CBG: Recent Labs  Lab 07/21/20 1956 07/24/20 1200  GLUCAP 98 175*   D-Dimer No results for input(s): DDIMER in the last 72 hours. Hgb A1c No results for input(s): HGBA1C in the last 72 hours. Lipid Profile No results for input(s): CHOL, HDL, LDLCALC, TRIG, CHOLHDL, LDLDIRECT in the last 72 hours. Thyroid function studies No results for input(s): TSH, T4TOTAL, T3FREE, THYROIDAB in the last 72 hours.  Invalid input(s): FREET3 Anemia work up No results for input(s): VITAMINB12,  FOLATE, FERRITIN, TIBC, IRON, RETICCTPCT in the last 72 hours. Microbiology Recent Results (from the past 240 hour(s))  SARS CORONAVIRUS 2 (TAT 6-24 HRS) Nasopharyngeal Nasopharyngeal Swab     Status: None   Collection Time: 07/22/20  1:52 PM   Specimen: Nasopharyngeal Swab  Result Value Ref Range Status   SARS Coronavirus 2 NEGATIVE NEGATIVE Final    Comment: (NOTE) SARS-CoV-2 target nucleic acids are NOT DETECTED.  The SARS-CoV-2 RNA is generally detectable in upper and lower respiratory specimens during the acute phase of infection. Negative results do not preclude SARS-CoV-2 infection, do not rule out co-infections with other pathogens, and should not be used as the sole basis for treatment or other patient management decisions. Negative results must be combined with clinical observations, patient history, and epidemiological information. The expected result is Negative.  Fact Sheet for Patients: HairSlick.no  Fact Sheet for Healthcare Providers: quierodirigir.com  This test is not yet approved or cleared by the [place] FDA and  has been authorized for detection and/or diagnosis of SARS-CoV-2 by FDA under an Emergency Use Authorization (EUA). This EUA will remain  in effect (meaning this test can be used) for the duration of the COVID-19 declaration under Se ction 564(b)(1) of the Act, 21 U.S.C. section 360bbb-3(b)(1), unless the authorization is terminated or revoked sooner.  Performed at [facility], [address] [place], [place] [address]   MRSA PCR Screening     Status: None   Collection Time: 07/22/20  6:43 PM  Result Value Ref Range Status   MRSA by PCR NEGATIVE NEGATIVE Final    Comment:        The GeneXpert MRSA Assay (FDA approved for NASAL specimens only), is one component of a comprehensive MRSA colonization surveillance program. It is not intended to diagnose MRSA infection nor to  guide or monitor treatment for MRSA infections. Performed at [facility], [address], [place], [place] [address]      Discharge Instructions:   Discharge Instructions     Diet - low sodium heart healthy   Complete by: As directed    Increase activity slowly   Complete by: As directed       Allergies as of 07/25/2020       Reactions   Banana    Raises K+   Budesonide-formoterol Fumarate    Other reaction(s): Sore throat Other reaction(s): Sore throat   Fish Allergy    Lisinopril    Other reaction(s): nausea Other reaction(s): nausea   Mirtazapine    Other reaction(s): Severe Headaches Other reaction(s): Severe Headaches   Sodium Bicarbonate  Other reaction(s): Makes her vomit Other reaction(s): Makes her vomit        Medication List     STOP taking these medications    aspirin EC 81 MG tablet       TAKE these medications    acetaminophen 325 MG tablet Commonly known as: TYLENOL Take 650 mg by mouth every 6 (six) hours as needed.   allopurinol 100 MG tablet Commonly known as: ZYLOPRIM Take 1 tablet (100 mg total) by mouth daily.   amLODipine 10 MG tablet Commonly known as: NORVASC Take 1 tablet (10 mg total) by mouth daily.   carvedilol 12.5 MG tablet Commonly known as: COREG Take 1 tablet (12.5 mg total) by mouth 2 (two) times daily with a meal.   docusate sodium 100 MG capsule Commonly known as: COLACE Take 1 capsule (100 mg total) by mouth 2 (two) times daily.   donepezil 5 MG tablet Commonly known as: ARICEPT Take 1 tablet (5 mg total) by mouth daily.   Ensure Take 237 mLs by mouth 3 (three) times daily between meals.   FeroSul 325 (65 FE) MG tablet Generic drug: ferrous sulfate Take 1 tablet (325 mg total) by mouth every morning.   lacosamide 200 MG Tabs tablet Commonly known as: Vimpat Take 1 tablet (200 mg total) by mouth 2 (two) times daily.   levETIRAcetam 1000 MG tablet Commonly known as: KEPPRA Take 1  tablet (1,000 mg total) by mouth 2 (two) times daily.   losartan 100 MG tablet Commonly known as: COZAAR Take 1 tablet (100 mg total) by mouth daily.   multivitamin with minerals Tabs tablet Take 1 tablet by mouth daily.   polyethylene glycol 17 g packet Commonly known as: MIRALAX / GLYCOLAX Take 17 g by mouth daily as needed for mild constipation.          Time coordinating discharge: 32 minutes  Signed:  Rie Mcneil  [facility] 07/25/2020, 11:28 AM   Pager on [email or web address]. If 7PM-7AM, please contact night-coverage at [email or web address]

## 2020-07-25 NOTE — [place] FL2 (Signed)
[place] MEDICAID FL2 LEVEL OF CARE SCREENING TOOL     IDENTIFICATION  Patient Name: [name] Birthdate: 01-06-1941 Sex: female Admission Date (Current Location): 07/21/2020  [place] and [place] Number:  Chiropodist and Address:  [facility], [address] [place], [place] [address]      Provider Number: [id number]  Attending Physician Name and Address:  Lurene Shadow, MD  Relative Name and Phone Number:  Lebron Conners [phone]    Current Level of Care: Hospital Recommended Level of Care: Skilled Nursing Facility Prior Approval Number:    Date Approved/Denied:   PASRR Number:    Discharge Plan: SNF    Current Diagnoses: Patient Active Problem List   Diagnosis Date Noted   Subdural hematoma (HCC) 07/22/2020   History of seizure 07/22/2020   AMS (altered mental status) 07/22/2020   Essential hypertension 07/22/2020   History of dementia 07/22/2020   Coronary artery disease involving native coronary artery of native heart with angina pectoris (HCC) 05/12/2020   Closed fracture of left hip (HCC) 03/27/2020   SIRS (systemic inflammatory response syndrome) (HCC) 03/27/2020   Intertrochanteric fracture of right femur (HCC) 01/04/2020   Humeral head fracture 01/04/2020   Humeral surgical neck fracture 01/04/2020   Witnessed seizure-like activity (HCC) 12/27/2019   Chronic kidney disease, stage 3b (HCC) 12/27/2019   Alzheimer's dementia without behavioral disturbance (HCC) 12/27/2019   Gout 12/27/2019   Leukocytosis 12/27/2019   Cough    Encounter for palliative care    Goals of care, counseling/discussion    Pneumonia    NICM (nonischemic cardiomyopathy) -EF 25% 05/20/2015   No significant CAD at cath May 2012 05/20/2015   Anemia 05/20/2015   Diastolic dysfunction-grade 3 05/20/2015   AKI (acute kidney injury) (HCC)    CAP (community acquired pneumonia)    Hyperammonemia (HCC) 05/17/2015   Metabolic  encephalopathy 05/16/2015   Acute on chronic renal insufficiency 05/15/2015   Chest pain 05/14/2015   Troponin level elevated 05/14/2015   Chronic combined systolic and diastolic congestive heart failure (HCC) 03/06/2014   Acute combined systolic and diastolic congestive heart failure (HCC) 02/24/2013   Weight gain 02/24/2013   Volume overload 02/24/2013   Hyperglycemia 02/24/2013   Dyspnea 02/24/2013   Constipation 02/24/2013   Abdominal pain 02/24/2013   Pleuritic chest pain 02/24/2013   Epilepsy (HCC)    Smoker    Ataxia, 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   Protein-calorie malnutrition, severe- wgt 86 lbs 02/13/2013   Pneumobilia 02/12/2013   Influenza A 02/11/2013   Hyperkalemia 02/11/2013   Hyponatremia 02/11/2013   HCAP (healthcare-associated pneumonia) 02/11/2013   Liver enzyme elevation 02/11/2013   Basal ganglia infarction (HCC) 01/08/2013   H/O: CVA (cerebrovascular accident) 01/04/2013   Generalized seizure (HCC) 12/28/2012   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   Seizure disorder (HCC) 12/28/2012   Metabolic acidosis 12/28/2012   Encephalopathy 12/28/2012   Acute renal failure (HCC) 12/28/2012    Orientation RESPIRATION BLADDER Height & Weight     Self, Situation  Normal Continent, External catheter Weight: 41.3 kg Height:  5' (152.4 cm)  BEHAVIORAL SYMPTOMS/MOOD NEUROLOGICAL BOWEL NUTRITION STATUS      Continent Diet (See discharge summary)  AMBULATORY STATUS COMMUNICATION OF NEEDS Skin   Limited Assist Verbally Normal                       Personal Care Assistance Level of Assistance  Bathing, Feeding, Dressing Bathing Assistance: Limited assistance  Feeding assistance: Limited assistance Dressing Assistance: Limited assistance     Functional Limitations Info             SPECIAL CARE FACTORS FREQUENCY  PT (By licensed PT), OT (By licensed OT)     PT Frequency: 5 times per week OT Frequency: 5 times per week             Contractures Contractures Info: Not present    Additional Factors Info  Code Status, Allergies Code Status Info: Full Allergies Info: Banana, Budesonide-fomoterol, fish allergy, lisinopril, Mirtazapine, sodium bicarbinate           Current Medications (07/25/2020):  This is the current hospital active medication list Current Facility-Administered Medications  Medication Dose Route Frequency Provider Last Rate Last Admin   acetaminophen (TYLENOL) tablet 650 mg  650 mg Oral Q6H PRN Andris Baumann, MD   650 mg at 07/24/20 2104   allopurinol (ZYLOPRIM) tablet 100 mg  100 mg Oral Daily Tu, Ching T, DO   100 mg at 07/25/20 0831   amLODipine (NORVASC) tablet 10 mg  10 mg Oral Daily Tu, Ching T, DO   10 mg at 07/25/20 0831   carvedilol (COREG) tablet 25 mg  25 mg Oral BID WC Lurene Shadow, MD   25 mg at 07/25/20 0831   docusate sodium (COLACE) capsule 100 mg  100 mg Oral BID Tu, Ching T, DO   100 mg at 07/25/20 0831   donepezil (ARICEPT) tablet 5 mg  5 mg Oral Daily Tu, Ching T, DO   5 mg at 07/25/20 0831   feeding supplement (NEPRO CARB STEADY) liquid 237 mL  237 mL Oral BID BM Lurene Shadow, MD 0 mL/hr at 07/23/20 2015 237 mL at 07/25/20 3235   ferrous sulfate tablet 325 mg  325 mg Oral q morning Tu, Ching T, DO   325 mg at 07/25/20 5732   hydrALAZINE (APRESOLINE) injection 5 mg  5 mg Intravenous Q4H PRN Tu, Ching T, DO       lacosamide (VIMPAT) tablet 200 mg  200 mg Oral BID Tu, Ching T, DO   200 mg at 07/25/20 0831   lactated ringers infusion   Intravenous Continuous Lurene Shadow, MD 75 mL/hr at 07/25/20 0421 New Bag at 07/25/20 0421   levETIRAcetam (KEPPRA) tablet 1,000 mg  1,000 mg Oral BID Doroteo Glassman, RPH   1,000 mg at 07/25/20 0831   ondansetron (ZOFRAN) injection 4 mg  4 mg Intravenous Q6H PRN Lurene Shadow, MD   4 mg at 07/22/20 1338   polyethylene glycol (MIRALAX / GLYCOLAX) packet 17 g  17 g Oral Daily PRN Tu, Ching T, DO   17 g at 07/24/20 1804      Discharge Medications: Please see discharge summary for a list of discharge medications.  Relevant Imaging Results:  Relevant Lab Results:   Additional Information SSN: [id number] Vaccinated/booster  Allayne Butcher, RN

## 2020-07-26 DIAGNOSIS — E875 Hyperkalemia: Secondary | ICD-10-CM | POA: Diagnosis not present

## 2020-07-26 DIAGNOSIS — N189 Chronic kidney disease, unspecified: Secondary | ICD-10-CM | POA: Diagnosis not present

## 2020-07-26 DIAGNOSIS — N179 Acute kidney failure, unspecified: Secondary | ICD-10-CM | POA: Diagnosis not present

## 2020-07-26 DIAGNOSIS — S065X9A Traumatic subdural hemorrhage with loss of consciousness of unspecified duration, initial encounter: Secondary | ICD-10-CM | POA: Diagnosis not present

## 2020-07-26 DIAGNOSIS — I10 Essential (primary) hypertension: Secondary | ICD-10-CM | POA: Diagnosis not present

## 2020-07-26 NOTE — Progress Notes (Signed)
Progress Note    [name]  TJQ:[id number] DOB: 06/12/1940  DOA: 07/21/2020 PCP: [name], MD      Brief Narrative:    Medical records reviewed and are as summarized below:  [name] is a 80 y.o. female with medical history significant for dementia, seizure disorder, hypertension, stroke, CKD stage IIIb, chronic systolic and diastolic CHF, falls, who presented to the hospital because of altered mental status.  Reportedly, she was yelling at the nursing facility and this was unusual for her.  She was evaluated for a fall on 07/08/2020 and at that time CT head did not show any acute abnormality.  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  Repeat CT head showed that the subdural hematomas were stable.  Case was discussed with neurosurgeon, Dr. Marcell Barlow, and he said that there was no need for follow-up or any further intervention. She had hypokalemia that was treated with [facility].  Losartan has been held.  She developed AKI and this improved with IV fluids.  Her condition is improved and she is deemed stable for discharge to SNF today.  Aspirin has been held because of subdural hematoma.  Aspirin may be resumed at a later date at the discretion of PCP or nursing home physician.     Assessment/Plan:   Principal Problem:   Subdural hematoma (HCC) Active Problems:   Hyperkalemia   AKI (acute kidney injury) (HCC)   History of seizure   AMS (altered mental status)   Essential hypertension   History of dementia   Body mass index is 17.77 kg/m.   Acute on chronic subdural hematoma, s/p fall: Repeat CT head on 07/22/2020 is stable.  Discussed with Dr. Marlou Porch, neurosurgeon, via secure chat on 07/22/2020.  He said there is no need for follow-up or surgery.  PT and OT evaluation.  Delirium/altered mental status: Improved.  She is at her baseline.    Hyperkalemia: Losartan is still on hold.  Improved.  AKI on CKD stage  IIIb: Improved with IV fluids.  Discontinue IV fluids.   Hypertension: BP is better.  Continue carvedilol and amlodipine.    Seizure disorder: Continue Keppra and Vimpat  Dementia: Continue donepezil.  History of stroke: Aspirin has been held because of subdural hematoma.  Awaiting insurance approval for discharge to SNF.   Diet Order             Diet - low sodium heart healthy           Diet Heart Room service appropriate? Yes; Fluid consistency: Thin  Diet effective now                      Consultants: None  Procedures: None    Medications:    allopurinol  100 mg Oral Daily   amLODipine  10 mg Oral Daily   carvedilol  25 mg Oral BID WC   docusate sodium  100 mg Oral BID   donepezil  5 mg Oral Daily   feeding supplement (NEPRO CARB STEADY)  237 mL Oral BID BM   ferrous sulfate  325 mg Oral q morning   lacosamide  200 mg Oral BID   levETIRAcetam  1,000 mg Oral BID   Continuous Infusions:     Anti-infectives (From admission, onward)    None              Family Communication/Anticipated D/C date and plan/Code Status   DVT prophylaxis:  SCDs Start: 07/22/20 0003     Code Status: Full Code  Family Communication: None Disposition Plan:    Status is: Observation  The patient will require care spanning > 2 midnights and should be moved to inpatient because: Inpatient level of care appropriate due to severity of illness  Dispo: The patient is from: Home              Anticipated d/c is to: Home              Patient currently is not medically stable to d/c.   Difficult to place patient No           Subjective:   Interval events noted.  She has no complaints.  Objective:    Vitals:   07/26/20 0053 07/26/20 0423 07/26/20 0749 07/26/20 1143  BP: 110/89 122/78 (!) 146/81 123/72  Pulse: 85 70 66 72  Resp: 15 17 16 16   Temp: 97.7 F (36.5 C) 97.7 F (36.5 C) 98.8 F (37.1 C) 98.4 F (36.9 C)  TempSrc:      SpO2: 99%  100% 98% 99%  Weight:      Height:       No data found.   Intake/Output Summary (Last 24 hours) at 07/26/2020 1359 Last data filed at 07/26/2020 1025 Gross per 24 hour  Intake 360 ml  Output 400 ml  Net -40 ml   Filed Weights   07/21/20 1847  Weight: 41.3 kg    Exam:   GEN: NAD SKIN: Warm and dry EYES: EOMI ENT: MMM CV: RRR PULM: CTA B ABD: soft, ND, NT, +BS CNS: AAO x 2, non focal EXT: No edema or tenderness        Data Reviewed:   I have personally reviewed following labs and imaging studies:  Labs: Labs show the following:   Basic Metabolic Panel: Recent Labs  Lab 07/21/20 1935 07/23/20 0536 07/24/20 0523 07/25/20 0546  NA 135 131* 137 137  K 5.8* 5.6* 5.0 3.7  CL 107 99 106 105  CO2 22 24 23 24   GLUCOSE 114* 87 99 86  BUN 48* 43* 63* 48*  CREATININE 1.52* 1.49* 2.12* 1.63*  CALCIUM 9.5 9.5 9.5 9.1   GFR Estimated Creatinine Clearance: 18.8 mL/min (A) (by C-G formula based on SCr of 1.63 mg/dL (H)). Liver Function Tests: Recent Labs  Lab 07/21/20 1935  AST 17  ALT 14  ALKPHOS 68  BILITOT 0.4  PROT 7.9  ALBUMIN 3.9   No results for input(s): LIPASE, AMYLASE in the last 168 hours. Recent Labs  Lab 07/21/20 1935  AMMONIA 15   Coagulation profile Recent Labs  Lab 07/21/20 1935  INR 1.0    CBC: Recent Labs  Lab 07/21/20 1935 07/23/20 0536  WBC 7.6 6.0  NEUTROABS 5.6  --   HGB 9.5* 11.5*  HCT 28.2* 34.9*  MCV 99.6 99.1  PLT 270 340   Cardiac Enzymes: No results for input(s): CKTOTAL, CKMB, CKMBINDEX, TROPONINI in the last 168 hours. BNP (last 3 results) No results for input(s): PROBNP in the last 8760 hours. CBG: Recent Labs  Lab 07/21/20 1956 07/24/20 1200  GLUCAP 98 175*   D-Dimer: No results for input(s): DDIMER in the last 72 hours. Hgb A1c: No results for input(s): HGBA1C in the last 72 hours. Lipid Profile: No results for input(s): CHOL, HDL, LDLCALC, TRIG, CHOLHDL, LDLDIRECT in the last 72  hours. Thyroid function studies: No results for input(s): TSH, T4TOTAL, T3FREE, THYROIDAB in the last  72 hours.  Invalid input(s): FREET3 Anemia work up: No results for input(s): VITAMINB12, FOLATE, FERRITIN, TIBC, IRON, RETICCTPCT in the last 72 hours. Sepsis Labs: Recent Labs  Lab 07/21/20 1935 07/23/20 0536  WBC 7.6 6.0    Microbiology Recent Results (from the past 240 hour(s))  SARS CORONAVIRUS 2 (TAT 6-24 HRS) Nasopharyngeal Nasopharyngeal Swab     Status: None   Collection Time: 07/22/20  1:52 PM   Specimen: Nasopharyngeal Swab  Result Value Ref Range Status   SARS Coronavirus 2 NEGATIVE NEGATIVE Final    Comment: (NOTE) SARS-CoV-2 target nucleic acids are NOT DETECTED.  The SARS-CoV-2 RNA is generally detectable in upper and lower respiratory specimens during the acute phase of infection. Negative results do not preclude SARS-CoV-2 infection, do not rule out co-infections with other pathogens, and should not be used as the sole basis for treatment or other patient management decisions. Negative results must be combined with clinical observations, patient history, and epidemiological information. The expected result is Negative.  Fact Sheet for Patients: HairSlick.no  Fact Sheet for Healthcare Providers: quierodirigir.com  This test is not yet approved or cleared by the [place] FDA and  has been authorized for detection and/or diagnosis of SARS-CoV-2 by FDA under an Emergency Use Authorization (EUA). This EUA will remain  in effect (meaning this test can be used) for the duration of the COVID-19 declaration under Se ction 564(b)(1) of the Act, 21 U.S.C. section 360bbb-3(b)(1), unless the authorization is terminated or revoked sooner.  Performed at [facility], [address] [place], [place] [address]   MRSA PCR Screening     Status: None   Collection Time: 07/22/20  6:43 PM  Result Value Ref  Range Status   MRSA by PCR NEGATIVE NEGATIVE Final    Comment:        The GeneXpert MRSA Assay (FDA approved for NASAL specimens only), is one component of a comprehensive MRSA colonization surveillance program. It is not intended to diagnose MRSA infection nor to guide or monitor treatment for MRSA infections. Performed at [facility], [address] [place], [place] [address]   SARS CORONAVIRUS 2 (TAT 6-24 HRS) Nasopharyngeal Nasopharyngeal Swab     Status: None   Collection Time: 07/25/20  5:17 AM   Specimen: Nasopharyngeal Swab  Result Value Ref Range Status   SARS Coronavirus 2 NEGATIVE NEGATIVE Final    Comment: (NOTE) SARS-CoV-2 target nucleic acids are NOT DETECTED.  The SARS-CoV-2 RNA is generally detectable in upper and lower respiratory specimens during the acute phase of infection. Negative results do not preclude SARS-CoV-2 infection, do not rule out co-infections with other pathogens, and should not be used as the sole basis for treatment or other patient management decisions. Negative results must be combined with clinical observations, patient history, and epidemiological information. The expected result is Negative.  Fact Sheet for Patients: HairSlick.no  Fact Sheet for Healthcare Providers: quierodirigir.com  This test is not yet approved or cleared by the [place] FDA and  has been authorized for detection and/or diagnosis of SARS-CoV-2 by FDA under an Emergency Use Authorization (EUA). This EUA will remain  in effect (meaning this test can be used) for the duration of the COVID-19 declaration under Se ction 564(b)(1) of the Act, 21 U.S.C. section 360bbb-3(b)(1), unless the authorization is terminated or revoked sooner.  Performed at [facility], [address], [place], [place] [address]   Resp Panel by RT-PCR (Flu A&B, Covid) Nasopharyngeal Swab  Status: None    Collection Time: 07/25/20 11:28 AM   Specimen: Nasopharyngeal Swab; Nasopharyngeal(NP) swabs in vial transport medium  Result Value Ref Range Status   SARS Coronavirus 2 by RT PCR NEGATIVE NEGATIVE Final    Comment: (NOTE) SARS-CoV-2 target nucleic acids are NOT DETECTED.  The SARS-CoV-2 RNA is generally detectable in upper respiratory specimens during the acute phase of infection. The lowest concentration of SARS-CoV-2 viral copies this assay can detect is 138 copies/mL. A negative result does not preclude SARS-Cov-2 infection and should not be used as the sole basis for treatment or other patient management decisions. A negative result may occur with  improper specimen collection/handling, submission of specimen other than nasopharyngeal swab, presence of viral mutation(s) within the areas targeted by this assay, and inadequate number of viral copies(<138 copies/mL). A negative result must be combined with clinical observations, patient history, and epidemiological information. The expected result is Negative.  Fact Sheet for Patients:  BloggerCourse.com  Fact Sheet for Healthcare Providers:  SeriousBroker.it  This test is no t yet approved or cleared by the [place] FDA and  has been authorized for detection and/or diagnosis of SARS-CoV-2 by FDA under an Emergency Use Authorization (EUA). This EUA will remain  in effect (meaning this test can be used) for the duration of the COVID-19 declaration under Section 564(b)(1) of the Act, 21 U.S.C.section 360bbb-3(b)(1), unless the authorization is terminated  or revoked sooner.       Influenza A by PCR NEGATIVE NEGATIVE Final   Influenza B by PCR NEGATIVE NEGATIVE Final    Comment: (NOTE) The Xpert Xpress SARS-CoV-2/FLU/RSV plus assay is intended as an aid in the diagnosis of influenza from Nasopharyngeal swab specimens and should not be used as a sole basis for treatment.  Nasal washings and aspirates are unacceptable for Xpert Xpress SARS-CoV-2/FLU/RSV testing.  Fact Sheet for Patients: BloggerCourse.com  Fact Sheet for Healthcare Providers: SeriousBroker.it  This test is not yet approved or cleared by the [place] FDA and has been authorized for detection and/or diagnosis of SARS-CoV-2 by FDA under an Emergency Use Authorization (EUA). This EUA will remain in effect (meaning this test can be used) for the duration of the COVID-19 declaration under Section 564(b)(1) of the Act, 21 U.S.C. section 360bbb-3(b)(1), unless the authorization is terminated or revoked.  Performed at [facility], [address] [place], [place] [address]     Procedures and diagnostic studies:  No results found.             LOS: 0 days   Rosalita Carey  [facility]   Pager on [email or web address]. If 7PM-7AM, please contact night-coverage at [email or web address]     07/26/2020, 1:59 PM

## 2020-07-27 DIAGNOSIS — I10 Essential (primary) hypertension: Secondary | ICD-10-CM | POA: Diagnosis not present

## 2020-07-27 DIAGNOSIS — N179 Acute kidney failure, unspecified: Secondary | ICD-10-CM | POA: Diagnosis not present

## 2020-07-27 DIAGNOSIS — N189 Chronic kidney disease, unspecified: Secondary | ICD-10-CM | POA: Diagnosis not present

## 2020-07-27 DIAGNOSIS — S065X9A Traumatic subdural hemorrhage with loss of consciousness of unspecified duration, initial encounter: Secondary | ICD-10-CM | POA: Diagnosis not present

## 2020-07-27 DIAGNOSIS — E875 Hyperkalemia: Secondary | ICD-10-CM | POA: Diagnosis not present

## 2020-07-27 NOTE — TOC Progression Note (Signed)
Transition of Care [facility]) - Progression Note    Patient Details  Name: [name] MRN: [id number] Date of Birth: 1940/02/17  Transition of Care [facility]) CM/SW Contact  Caryn Section, RN Phone Number: 07/27/2020, 12:22 PM  Clinical Narrative: Per Raul Del, she is currently obtaining auth for patient's return to [facility].  Family aware.     Expected Discharge Plan: Skilled Nursing Facility Barriers to Discharge: Insurance Authorization  Expected Discharge Plan and Services Expected Discharge Plan: Skilled Nursing Facility   Discharge Planning Services: CM Consult Post Acute Care Choice: Skilled Nursing Facility   Expected Discharge Date: 07/25/20               DME Arranged: N/A DME Agency: NA       HH Arranged: NA           Social Determinants of Health (SDOH) Interventions    Readmission Risk Interventions No flowsheet data found.

## 2020-07-27 NOTE — Progress Notes (Signed)
Progress Note    [name]  WUX:[id number] DOB: 19-Feb-1940  DOA: 07/21/2020 PCP: [name], MD      Brief Narrative:    Medical records reviewed and are as summarized below:  [name] is a 80 y.o. female with medical history significant for dementia, seizure disorder, hypertension, stroke, CKD stage IIIb, chronic systolic and diastolic CHF, falls, who presented to the hospital because of altered mental status.  Reportedly, she was yelling at the nursing facility and this was unusual for her.  She was evaluated for a fall on 07/08/2020 and at that time CT head did not show any acute abnormality.  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  Repeat CT head showed that the subdural hematomas were stable.  Case was discussed with neurosurgeon, Dr. Marcell Barlow, and he said that there was no need for follow-up or any further intervention. She had hypokalemia that was treated with [facility].  Losartan has been held.  She developed AKI and this improved with IV fluids.  Her condition is improved and she is deemed stable for discharge to SNF today.  Aspirin has been held because of subdural hematoma.  Aspirin may be resumed at a later date at the discretion of PCP or nursing home physician.     Assessment/Plan:   Principal Problem:   Subdural hematoma (HCC) Active Problems:   Hyperkalemia   AKI (acute kidney injury) (HCC)   History of seizure   AMS (altered mental status)   Essential hypertension   History of dementia   Body mass index is 17.77 kg/m.   Acute on chronic subdural hematoma, s/p fall: Repeat CT head on 07/22/2020 is stable.  Discussed with Dr. Marlou Porch, neurosurgeon, via secure chat on 07/22/2020.  He said there is no need for follow-up or surgery.  PT and OT recommend discharge to SNF  Delirium/altered mental status: Improved.  She is at her baseline.    Hyperkalemia: Losartan is still on hold.   Improved.  AKI on CKD stage IIIb: Improved.  Creatinine is up around baseline (1.4-1.6).  Hypertension: Continue antihypertensives  Seizure disorder: Continue Keppra and Vimpat  Dementia: Continue donepezil.  History of stroke: Aspirin has been held because of subdural hematoma.  Awaiting insurance authorization.   Diet Order             Diet - low sodium heart healthy           Diet Heart Room service appropriate? Yes; Fluid consistency: Thin  Diet effective now                      Consultants: None  Procedures: None    Medications:    allopurinol  100 mg Oral Daily   amLODipine  10 mg Oral Daily   carvedilol  25 mg Oral BID WC   docusate sodium  100 mg Oral BID   donepezil  5 mg Oral Daily   feeding supplement (NEPRO CARB STEADY)  237 mL Oral BID BM   ferrous sulfate  325 mg Oral q morning   lacosamide  200 mg Oral BID   levETIRAcetam  1,000 mg Oral BID   Continuous Infusions:     Anti-infectives (From admission, onward)    None              Family Communication/Anticipated D/C date and plan/Code Status   DVT prophylaxis: SCDs Start: 07/22/20 0003     Code Status:  Full Code  Family Communication: None Disposition Plan:    Status is: Observation  The patient will require care spanning > 2 midnights and should be moved to inpatient because: Inpatient level of care appropriate due to severity of illness  Dispo: The patient is from: Home              Anticipated d/c is to: Home              Patient currently is not medically stable to d/c.   Difficult to place patient No           Subjective:   Interval events noted.  No headache, dizziness, chest pain or shortness of breath.  Objective:    Vitals:   07/26/20 2347 07/27/20 0334 07/27/20 0723 07/27/20 1238  BP: 119/68 119/69 (!) 162/78 (!) 147/67  Pulse: 71 67 74 66  Resp: 20 16 16 17   Temp: 97.8 F (36.6 C) 98.2 F (36.8 C) 98.6 F (37 C) (!) 97.4 F  (36.3 C)  TempSrc:      SpO2: 96% 100% 94% 99%  Weight:      Height:       No data found.   Intake/Output Summary (Last 24 hours) at 07/27/2020 1447 Last data filed at 07/27/2020 1358 Gross per 24 hour  Intake 600 ml  Output --  Net 600 ml   Filed Weights   07/21/20 1847  Weight: 41.3 kg    Exam:  GEN: NAD SKIN: No rash EYES: EOMI ENT: MMM CV: RRR PULM: CTA B ABD: soft, ND, NT, +BS CNS: AAO x 3, non focal EXT: No edema or tenderness          Data Reviewed:   I have personally reviewed following labs and imaging studies:  Labs: Labs show the following:   Basic Metabolic Panel: Recent Labs  Lab 07/21/20 1935 07/23/20 0536 07/24/20 0523 07/25/20 0546  NA 135 131* 137 137  K 5.8* 5.6* 5.0 3.7  CL 107 99 106 105  CO2 22 24 23 24   GLUCOSE 114* 87 99 86  BUN 48* 43* 63* 48*  CREATININE 1.52* 1.49* 2.12* 1.63*  CALCIUM 9.5 9.5 9.5 9.1   GFR Estimated Creatinine Clearance: 18.8 mL/min (A) (by C-G formula based on SCr of 1.63 mg/dL (H)). Liver Function Tests: Recent Labs  Lab 07/21/20 1935  AST 17  ALT 14  ALKPHOS 68  BILITOT 0.4  PROT 7.9  ALBUMIN 3.9   No results for input(s): LIPASE, AMYLASE in the last 168 hours. Recent Labs  Lab 07/21/20 1935  AMMONIA 15   Coagulation profile Recent Labs  Lab 07/21/20 1935  INR 1.0    CBC: Recent Labs  Lab 07/21/20 1935 07/23/20 0536  WBC 7.6 6.0  NEUTROABS 5.6  --   HGB 9.5* 11.5*  HCT 28.2* 34.9*  MCV 99.6 99.1  PLT 270 340   Cardiac Enzymes: No results for input(s): CKTOTAL, CKMB, CKMBINDEX, TROPONINI in the last 168 hours. BNP (last 3 results) No results for input(s): PROBNP in the last 8760 hours. CBG: Recent Labs  Lab 07/21/20 1956 07/24/20 1200  GLUCAP 98 175*   D-Dimer: No results for input(s): DDIMER in the last 72 hours. Hgb A1c: No results for input(s): HGBA1C in the last 72 hours. Lipid Profile: No results for input(s): CHOL, HDL, LDLCALC, TRIG, CHOLHDL,  LDLDIRECT in the last 72 hours. Thyroid function studies: No results for input(s): TSH, T4TOTAL, T3FREE, THYROIDAB in the last 72 hours.  Invalid  input(s): FREET3 Anemia work up: No results for input(s): VITAMINB12, FOLATE, FERRITIN, TIBC, IRON, RETICCTPCT in the last 72 hours. Sepsis Labs: Recent Labs  Lab 07/21/20 1935 07/23/20 0536  WBC 7.6 6.0    Microbiology Recent Results (from the past 240 hour(s))  SARS CORONAVIRUS 2 (TAT 6-24 HRS) Nasopharyngeal Nasopharyngeal Swab     Status: None   Collection Time: 07/22/20  1:52 PM   Specimen: Nasopharyngeal Swab  Result Value Ref Range Status   SARS Coronavirus 2 NEGATIVE NEGATIVE Final    Comment: (NOTE) SARS-CoV-2 target nucleic acids are NOT DETECTED.  The SARS-CoV-2 RNA is generally detectable in upper and lower respiratory specimens during the acute phase of infection. Negative results do not preclude SARS-CoV-2 infection, do not rule out co-infections with other pathogens, and should not be used as the sole basis for treatment or other patient management decisions. Negative results must be combined with clinical observations, patient history, and epidemiological information. The expected result is Negative.  Fact Sheet for Patients: HairSlick.no  Fact Sheet for Healthcare Providers: quierodirigir.com  This test is not yet approved or cleared by the [place] FDA and  has been authorized for detection and/or diagnosis of SARS-CoV-2 by FDA under an Emergency Use Authorization (EUA). This EUA will remain  in effect (meaning this test can be used) for the duration of the COVID-19 declaration under Se ction 564(b)(1) of the Act, 21 U.S.C. section 360bbb-3(b)(1), unless the authorization is terminated or revoked sooner.  Performed at [facility], [address] [place], [place] [address]   MRSA PCR Screening     Status: None   Collection Time: 07/22/20   6:43 PM  Result Value Ref Range Status   MRSA by PCR NEGATIVE NEGATIVE Final    Comment:        The GeneXpert MRSA Assay (FDA approved for NASAL specimens only), is one component of a comprehensive MRSA colonization surveillance program. It is not intended to diagnose MRSA infection nor to guide or monitor treatment for MRSA infections. Performed at [facility], [address] [place], [place] [address]   SARS CORONAVIRUS 2 (TAT 6-24 HRS) Nasopharyngeal Nasopharyngeal Swab     Status: None   Collection Time: 07/25/20  5:17 AM   Specimen: Nasopharyngeal Swab  Result Value Ref Range Status   SARS Coronavirus 2 NEGATIVE NEGATIVE Final    Comment: (NOTE) SARS-CoV-2 target nucleic acids are NOT DETECTED.  The SARS-CoV-2 RNA is generally detectable in upper and lower respiratory specimens during the acute phase of infection. Negative results do not preclude SARS-CoV-2 infection, do not rule out co-infections with other pathogens, and should not be used as the sole basis for treatment or other patient management decisions. Negative results must be combined with clinical observations, patient history, and epidemiological information. The expected result is Negative.  Fact Sheet for Patients: HairSlick.no  Fact Sheet for Healthcare Providers: quierodirigir.com  This test is not yet approved or cleared by the [place] FDA and  has been authorized for detection and/or diagnosis of SARS-CoV-2 by FDA under an Emergency Use Authorization (EUA). This EUA will remain  in effect (meaning this test can be used) for the duration of the COVID-19 declaration under Se ction 564(b)(1) of the Act, 21 U.S.C. section 360bbb-3(b)(1), unless the authorization is terminated or revoked sooner.  Performed at [facility], [address] [place], [place] [address]   Resp Panel by RT-PCR (Flu A&B, Covid) Nasopharyngeal Swab  Status: None   Collection Time: 07/25/20 11:28 AM   Specimen: Nasopharyngeal Swab; Nasopharyngeal(NP) swabs in vial transport medium  Result Value Ref Range Status   SARS Coronavirus 2 by RT PCR NEGATIVE NEGATIVE Final    Comment: (NOTE) SARS-CoV-2 target nucleic acids are NOT DETECTED.  The SARS-CoV-2 RNA is generally detectable in upper respiratory specimens during the acute phase of infection. The lowest concentration of SARS-CoV-2 viral copies this assay can detect is 138 copies/mL. A negative result does not preclude SARS-Cov-2 infection and should not be used as the sole basis for treatment or other patient management decisions. A negative result may occur with  improper specimen collection/handling, submission of specimen other than nasopharyngeal swab, presence of viral mutation(s) within the areas targeted by this assay, and inadequate number of viral copies(<138 copies/mL). A negative result must be combined with clinical observations, patient history, and epidemiological information. The expected result is Negative.  Fact Sheet for Patients:  BloggerCourse.com  Fact Sheet for Healthcare Providers:  SeriousBroker.it  This test is no t yet approved or cleared by the [place] FDA and  has been authorized for detection and/or diagnosis of SARS-CoV-2 by FDA under an Emergency Use Authorization (EUA). This EUA will remain  in effect (meaning this test can be used) for the duration of the COVID-19 declaration under Section 564(b)(1) of the Act, 21 U.S.C.section 360bbb-3(b)(1), unless the authorization is terminated  or revoked sooner.       Influenza A by PCR NEGATIVE NEGATIVE Final   Influenza B by PCR NEGATIVE NEGATIVE Final    Comment: (NOTE) The Xpert Xpress SARS-CoV-2/FLU/RSV plus assay is intended as an aid in the diagnosis of influenza from Nasopharyngeal swab specimens and should not be used as a sole basis  for treatment. Nasal washings and aspirates are unacceptable for Xpert Xpress SARS-CoV-2/FLU/RSV testing.  Fact Sheet for Patients: BloggerCourse.com  Fact Sheet for Healthcare Providers: SeriousBroker.it  This test is not yet approved or cleared by the [place] FDA and has been authorized for detection and/or diagnosis of SARS-CoV-2 by FDA under an Emergency Use Authorization (EUA). This EUA will remain in effect (meaning this test can be used) for the duration of the COVID-19 declaration under Section 564(b)(1) of the Act, 21 U.S.C. section 360bbb-3(b)(1), unless the authorization is terminated or revoked.  Performed at [facility], [address] [place], [place] [address]     Procedures and diagnostic studies:  No results found.             LOS: 0 days   Lindey Renzulli  [facility]   Pager on [email or web address]. If 7PM-7AM, please contact night-coverage at [email or web address]     07/27/2020, 2:47 PM

## 2020-07-28 DIAGNOSIS — N179 Acute kidney failure, unspecified: Secondary | ICD-10-CM | POA: Diagnosis not present

## 2020-07-28 DIAGNOSIS — N189 Chronic kidney disease, unspecified: Secondary | ICD-10-CM | POA: Diagnosis not present

## 2020-07-28 DIAGNOSIS — E875 Hyperkalemia: Secondary | ICD-10-CM | POA: Diagnosis not present

## 2020-07-28 DIAGNOSIS — I10 Essential (primary) hypertension: Secondary | ICD-10-CM | POA: Diagnosis not present

## 2020-07-28 DIAGNOSIS — S065X9A Traumatic subdural hemorrhage with loss of consciousness of unspecified duration, initial encounter: Secondary | ICD-10-CM | POA: Diagnosis not present

## 2020-07-28 NOTE — TOC Progression Note (Addendum)
Transition of Care [facility]) - Progression Note    Patient Details  Name: Lani Havlik MRN: [id number] Date of Birth: 1940/11/18  Transition of Care [facility]) CM/SW Contact  Caryn Section, RN Phone Number: 07/28/2020, 10:13 AM  Clinical Narrative:    Addendum'  [employer] is telling [facility] that they need additional PT notes.  All PT notes sent to [facility], care team aware.  RNCM spoke with Archie Patten from [facility], authorization still in progress.  [employer] needed more clarity of admission date, information sent to [facility].  Awaiting response.      Expected Discharge Plan: Skilled Nursing Facility Barriers to Discharge: Insurance Authorization  Expected Discharge Plan and Services Expected Discharge Plan: Skilled Nursing Facility   Discharge Planning Services: CM Consult Post Acute Care Choice: Skilled Nursing Facility   Expected Discharge Date: 07/25/20               DME Arranged: N/A DME Agency: NA       HH Arranged: NA           Social Determinants of Health (SDOH) Interventions    Readmission Risk Interventions No flowsheet data found.

## 2020-07-28 NOTE — Progress Notes (Signed)
Progress Note    [name]  ZNB:[id number] DOB: [birth year]  DOA: 07/21/2020 PCP: [name], MD      Brief Narrative:    Medical records reviewed and are as summarized below:  [name] is a 80 y.o. female with medical history significant for dementia, seizure disorder, hypertension, stroke, CKD stage IIIb, chronic systolic and diastolic CHF, falls, who presented to the hospital because of altered mental status.  Reportedly, she was yelling at the nursing facility and this was unusual for her.  She was evaluated for a fall on 07/08/2020 and at that time CT head did not show any acute abnormality.  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  Repeat CT head showed that the subdural hematomas were stable.  Case was discussed with neurosurgeon, Dr. Marcell Barlow, and he said that there was no need for follow-up or any further intervention. She had hypokalemia that was treated with [facility].  Losartan has been held.  She developed AKI and this improved with IV fluids.  Her condition is improved and she is deemed stable for discharge to SNF today.  Aspirin has been held because of subdural hematoma.  Aspirin may be resumed at a later date at the discretion of PCP or nursing home physician.     Assessment/Plan:   Principal Problem:   Subdural hematoma (HCC) Active Problems:   Hyperkalemia   AKI (acute kidney injury) (HCC)   History of seizure   AMS (altered mental status)   Essential hypertension   History of dementia   Body mass index is 17.77 kg/m.   Acute on chronic subdural hematoma, s/p fall: Repeat CT head on 07/22/2020 is stable.  Discussed with Dr. Marlou Porch, neurosurgeon, via secure chat on 07/22/2020.  He said there is no need for follow-up or surgery.  PT and OT recommend discharge to SNF  Delirium/altered mental status: Improved.  She is at her baseline.    Hyperkalemia: Improved.  Losartan has been held since  admission.  AKI on CKD stage IIIb: Improved.  Creatinine is up around baseline (1.4-1.6).  Hypertension: Continue antihypertensives  Seizure disorder: Continue Keppra and Vimpat  Dementia: Continue donepezil.  History of stroke: Aspirin has been held because of subdural hematoma.  Awaiting insurance approval for discharge to SNF.   Diet Order             Diet - low sodium heart healthy           Diet Heart Room service appropriate? Yes; Fluid consistency: Thin  Diet effective now                      Consultants: None  Procedures: None    Medications:    allopurinol  100 mg Oral Daily   amLODipine  10 mg Oral Daily   carvedilol  25 mg Oral BID WC   docusate sodium  100 mg Oral BID   donepezil  5 mg Oral Daily   feeding supplement (NEPRO CARB STEADY)  237 mL Oral BID BM   ferrous sulfate  325 mg Oral q morning   lacosamide  200 mg Oral BID   levETIRAcetam  1,000 mg Oral BID   Continuous Infusions:     Anti-infectives (From admission, onward)    None              Family Communication/Anticipated D/C date and plan/Code Status   DVT prophylaxis: SCDs Start: 07/22/20 0003  Code Status: Full Code  Family Communication: None Disposition Plan:    Status is: Observation  The patient will require care spanning > 2 midnights and should be moved to inpatient because: Inpatient level of care appropriate due to severity of illness  Dispo: The patient is from: Home              Anticipated d/c is to: Home              Patient currently is not medically stable to d/c.   Difficult to place patient No           Subjective:   Interval events noted.  She has no complaints.  She feels well.  Objective:    Vitals:   07/27/20 2332 07/28/20 0329 07/28/20 0719 07/28/20 1111  BP: (!) 112/59 140/84 134/80 136/87  Pulse: 75 82 76 80  Resp: 18 16 18 18   Temp: 98.3 F (36.8 C) 98.1 F (36.7 C) 97.6 F (36.4 C) 97.7 F (36.5 C)   TempSrc:      SpO2: 100% 96% 98% 99%  Weight:      Height:       No data found.   Intake/Output Summary (Last 24 hours) at 07/28/2020 1524 Last data filed at 07/28/2020 1425 Gross per 24 hour  Intake 600 ml  Output 500 ml  Net 100 ml   Filed Weights   07/21/20 1847  Weight: 41.3 kg    Exam:  GEN: NAD SKIN: Warm and dry EYES: EOMI ENT: MMM CV: RRR PULM: CTA B ABD: soft, ND, NT, +BS CNS: AAO x 3, non focal EXT: No edema or tenderness           Data Reviewed:   I have personally reviewed following labs and imaging studies:  Labs: Labs show the following:   Basic Metabolic Panel: Recent Labs  Lab 07/21/20 1935 07/23/20 0536 07/24/20 0523 07/25/20 0546  NA 135 131* 137 137  K 5.8* 5.6* 5.0 3.7  CL 107 99 106 105  CO2 22 24 23 24   GLUCOSE 114* 87 99 86  BUN 48* 43* 63* 48*  CREATININE 1.52* 1.49* 2.12* 1.63*  CALCIUM 9.5 9.5 9.5 9.1   GFR Estimated Creatinine Clearance: 18.8 mL/min (A) (by C-G formula based on SCr of 1.63 mg/dL (H)). Liver Function Tests: Recent Labs  Lab 07/21/20 1935  AST 17  ALT 14  ALKPHOS 68  BILITOT 0.4  PROT 7.9  ALBUMIN 3.9   No results for input(s): LIPASE, AMYLASE in the last 168 hours. Recent Labs  Lab 07/21/20 1935  AMMONIA 15   Coagulation profile Recent Labs  Lab 07/21/20 1935  INR 1.0    CBC: Recent Labs  Lab 07/21/20 1935 07/23/20 0536  WBC 7.6 6.0  NEUTROABS 5.6  --   HGB 9.5* 11.5*  HCT 28.2* 34.9*  MCV 99.6 99.1  PLT 270 340   Cardiac Enzymes: No results for input(s): CKTOTAL, CKMB, CKMBINDEX, TROPONINI in the last 168 hours. BNP (last 3 results) No results for input(s): PROBNP in the last 8760 hours. CBG: Recent Labs  Lab 07/21/20 1956 07/24/20 1200  GLUCAP 98 175*   D-Dimer: No results for input(s): DDIMER in the last 72 hours. Hgb A1c: No results for input(s): HGBA1C in the last 72 hours. Lipid Profile: No results for input(s): CHOL, HDL, LDLCALC, TRIG, CHOLHDL,  LDLDIRECT in the last 72 hours. Thyroid function studies: No results for input(s): TSH, T4TOTAL, T3FREE, THYROIDAB in the last 72 hours.  Invalid input(s): FREET3 Anemia work up: No results for input(s): VITAMINB12, FOLATE, FERRITIN, TIBC, IRON, RETICCTPCT in the last 72 hours. Sepsis Labs: Recent Labs  Lab 07/21/20 1935 07/23/20 0536  WBC 7.6 6.0    Microbiology Recent Results (from the past 240 hour(s))  SARS CORONAVIRUS 2 (TAT 6-24 HRS) Nasopharyngeal Nasopharyngeal Swab     Status: None   Collection Time: 07/22/20  1:52 PM   Specimen: Nasopharyngeal Swab  Result Value Ref Range Status   SARS Coronavirus 2 NEGATIVE NEGATIVE Final    Comment: (NOTE) SARS-CoV-2 target nucleic acids are NOT DETECTED.  The SARS-CoV-2 RNA is generally detectable in upper and lower respiratory specimens during the acute phase of infection. Negative results do not preclude SARS-CoV-2 infection, do not rule out co-infections with other pathogens, and should not be used as the sole basis for treatment or other patient management decisions. Negative results must be combined with clinical observations, patient history, and epidemiological information. The expected result is Negative.  Fact Sheet for Patients: HairSlick.no  Fact Sheet for Healthcare Providers: quierodirigir.com  This test is not yet approved or cleared by the [place] FDA and  has been authorized for detection and/or diagnosis of SARS-CoV-2 by FDA under an Emergency Use Authorization (EUA). This EUA will remain  in effect (meaning this test can be used) for the duration of the COVID-19 declaration under Se ction 564(b)(1) of the Act, 21 U.S.C. section 360bbb-3(b)(1), unless the authorization is terminated or revoked sooner.  Performed at [facility], [address], [place], [place] [address]   MRSA PCR Screening     Status: None   Collection Time: 07/22/20   6:43 PM  Result Value Ref Range Status   MRSA by PCR NEGATIVE NEGATIVE Final    Comment:        The GeneXpert MRSA Assay (FDA approved for NASAL specimens only), is one component of a comprehensive MRSA colonization surveillance program. It is not intended to diagnose MRSA infection nor to guide or monitor treatment for MRSA infections. Performed at [facility], [address] [place], [place] [address]   SARS CORONAVIRUS 2 (TAT 6-24 HRS) Nasopharyngeal Nasopharyngeal Swab     Status: None   Collection Time: 07/25/20  5:17 AM   Specimen: Nasopharyngeal Swab  Result Value Ref Range Status   SARS Coronavirus 2 NEGATIVE NEGATIVE Final    Comment: (NOTE) SARS-CoV-2 target nucleic acids are NOT DETECTED.  The SARS-CoV-2 RNA is generally detectable in upper and lower respiratory specimens during the acute phase of infection. Negative results do not preclude SARS-CoV-2 infection, do not rule out co-infections with other pathogens, and should not be used as the sole basis for treatment or other patient management decisions. Negative results must be combined with clinical observations, patient history, and epidemiological information. The expected result is Negative.  Fact Sheet for Patients: HairSlick.no  Fact Sheet for Healthcare Providers: quierodirigir.com  This test is not yet approved or cleared by the [place] FDA and  has been authorized for detection and/or diagnosis of SARS-CoV-2 by FDA under an Emergency Use Authorization (EUA). This EUA will remain  in effect (meaning this test can be used) for the duration of the COVID-19 declaration under Se ction 564(b)(1) of the Act, 21 U.S.C. section 360bbb-3(b)(1), unless the authorization is terminated or revoked sooner.  Performed at [facility], [address] [place], [place] [address]   Resp Panel by RT-PCR (Flu A&B, Covid) Nasopharyngeal Swab  Status: None   Collection Time: 07/25/20 11:28 AM   Specimen: Nasopharyngeal Swab; Nasopharyngeal(NP) swabs in vial transport medium  Result Value Ref Range Status   SARS Coronavirus 2 by RT PCR NEGATIVE NEGATIVE Final    Comment: (NOTE) SARS-CoV-2 target nucleic acids are NOT DETECTED.  The SARS-CoV-2 RNA is generally detectable in upper respiratory specimens during the acute phase of infection. The lowest concentration of SARS-CoV-2 viral copies this assay can detect is 138 copies/mL. A negative result does not preclude SARS-Cov-2 infection and should not be used as the sole basis for treatment or other patient management decisions. A negative result may occur with  improper specimen collection/handling, submission of specimen other than nasopharyngeal swab, presence of viral mutation(s) within the areas targeted by this assay, and inadequate number of viral copies(<138 copies/mL). A negative result must be combined with clinical observations, patient history, and epidemiological information. The expected result is Negative.  Fact Sheet for Patients:  BloggerCourse.com  Fact Sheet for Healthcare Providers:  SeriousBroker.it  This test is no t yet approved or cleared by the [place] FDA and  has been authorized for detection and/or diagnosis of SARS-CoV-2 by FDA under an Emergency Use Authorization (EUA). This EUA will remain  in effect (meaning this test can be used) for the duration of the COVID-19 declaration under Section 564(b)(1) of the Act, 21 U.S.C.section 360bbb-3(b)(1), unless the authorization is terminated  or revoked sooner.       Influenza A by PCR NEGATIVE NEGATIVE Final   Influenza B by PCR NEGATIVE NEGATIVE Final    Comment: (NOTE) The Xpert Xpress SARS-CoV-2/FLU/RSV plus assay is intended as an aid in the diagnosis of influenza from Nasopharyngeal swab specimens and should not be used as a sole basis  for treatment. Nasal washings and aspirates are unacceptable for Xpert Xpress SARS-CoV-2/FLU/RSV testing.  Fact Sheet for Patients: BloggerCourse.com  Fact Sheet for Healthcare Providers: SeriousBroker.it  This test is not yet approved or cleared by the [place] FDA and has been authorized for detection and/or diagnosis of SARS-CoV-2 by FDA under an Emergency Use Authorization (EUA). This EUA will remain in effect (meaning this test can be used) for the duration of the COVID-19 declaration under Section 564(b)(1) of the Act, 21 U.S.C. section 360bbb-3(b)(1), unless the authorization is terminated or revoked.  Performed at [facility], [address] [place], [place] [address]     Procedures and diagnostic studies:  No results found.             LOS: 0 days   Hazell Siwik  [facility]   Pager on [email or web address]. If 7PM-7AM, please contact night-coverage at [email or web address]     07/28/2020, 3:24 PM

## 2020-07-28 NOTE — Progress Notes (Signed)
Occupational Therapy Treatment Patient Details Name: Julieana Eshleman MRN: [id number] DOB: November 20, 1940 Today's Date: 07/28/2020    History of present illness 80 y.o. female presented to ED for AMS, history of multiple chronic medical conditions including dementia, vascular dementia, encephalopathy pneumonia seizure disorder etc. New acute on chronic L parietal with midline shift.   OT comments  Upon entering the room, pt supine in bed with no c/o pain and agreeable to OT intervention. Pt reports feeling much better and requesting to exit the bed to recliner chair. Pt performed bed mobility with min A for trunk control to EOB. Pt standing from standard height bed with mod lifting assistance and taking several steps to recliner chair with min A but needing mod multimodal cuing for sequencing. Pt having most difficulty turning towards the L towards chair. Once seated in recliner chair, pt engaged in B coordination tasks to open several containers on table with increased time and cuing for technique. Pt led in finger isolation exercises with difficulty initiate any isolated movement of digits 3-5 without assistance. Pt remained in recliner chair at end of session with chair alarm donned and call bell within reach.   Follow Up Recommendations  SNF;Supervision/Assistance - 24 hour    Equipment Recommendations  None recommended by OT       Precautions / Restrictions Precautions Precautions: Fall Restrictions Weight Bearing Restrictions: No       Mobility Bed Mobility Overal bed mobility: Needs Assistance Bed Mobility: Supine to Sit     Supine to sit: Min assist Sit to supine: Min assist   General bed mobility comments: min A for trunk support with min cuing for technique    Transfers Overall transfer level: Needs assistance Equipment used: Rolling walker (2 wheeled) Transfers: Sit to/from [employer] Sit to Stand: Mod assist Stand pivot transfers: Min assist        General transfer comment: mod lifting assistance to stand and min A for functional transfer    Balance Overall balance assessment: Needs assistance Sitting-balance support: Feet supported;Single extremity supported Sitting balance-Leahy Scale: Fair     Standing balance support: During functional activity;Bilateral upper extremity supported Standing balance-Leahy Scale: Poor                             ADL either performed or assessed with clinical judgement   ADL Overall ADL's : Needs assistance/impaired Eating/Feeding: Set up Eating/Feeding Details (indicate cue type and reason): assist to open containers and cut food Grooming: Wash/dry hands;Wash/dry face;Set up;Supervision/safety Grooming Details (indicate cue type and reason): min cuing                                     Vision Baseline Vision/History: Wears glasses Patient Visual Report: No change from baseline            Cognition Arousal/Alertness: Awake/alert Behavior During Therapy: Flat affect;WFL for tasks assessed/performed Overall Cognitive Status: History of cognitive impairments - at baseline                                 General Comments: Pt pleasant and cooperative                   Pertinent Vitals/ Pain       Pain Assessment: Faces Faces Pain Scale: No  hurt  Home Living Family/patient expects to be discharged to:: Skilled nursing facility                                            Frequency  Min 2X/week        Progress Toward Goals  OT Goals(current goals can now be found in the care plan section)  Progress towards OT goals: Progressing toward goals  Acute Rehab OT Goals Patient Stated Goal: Want to go back to [place] care OT Goal Formulation: With patient Time For Goal Achievement: 08/06/20 Potential to Achieve Goals: Fair  Plan Discharge plan remains appropriate    AM-PAC OT "6 Clicks" Daily Activity      Outcome Measure   Help from another person eating meals?: A Little Help from another person taking care of personal grooming?: A Little Help from another person toileting, which includes using toliet, bedpan, or urinal?: A Lot Help from another person bathing (including washing, rinsing, drying)?: A Lot Help from another person to put on and taking off regular upper body clothing?: A Little Help from another person to put on and taking off regular lower body clothing?: A Lot 6 Click Score: 15    End of Session    OT Visit Diagnosis: Unsteadiness on feet (R26.81);Repeated falls (R29.6);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

## 2020-07-28 NOTE — Progress Notes (Signed)
Physical Therapy Treatment Patient Details Name: [name] MRN: [id number] DOB: [birth year] Today's Date: 07/28/2020    History of Present Illness 80 y.o. female presented to ED for AMS, history of multiple chronic medical conditions including dementia, vascular dementia, encephalopathy pneumonia seizure disorder etc. New acute on chronic L parietal with midline shift.    PT Comments    Patient has made progress towards meeting functional goals. She is cooperative during session. Increased independence with transfers and ambulation this session. Patient continues to require support and assistance for all functional mobility and would benefit from continued PT to maximize independence. SNF is recommended at discharge for ongoing PT efforts.    Follow Up Recommendations  SNF     Equipment Recommendations  None recommended by PT    Recommendations for Other Services       Precautions / Restrictions Precautions Precautions: Fall Restrictions Weight Bearing Restrictions: No    Mobility  Bed Mobility Overal bed mobility: Needs Assistance Bed Mobility: Supine to Sit     Supine to sit: Min assist Sit to supine: Min assist   General bed mobility comments: assistance for LE support. verbal cues for technique and sequencing    Transfers Overall transfer level: Needs assistance Equipment used: Rolling walker (2 wheeled) Transfers: Sit to/from Stand Sit to Stand: Mod assist Stand pivot transfers: Min assist       General transfer comment: 3 bouts of standing performed. verbal cues for forward weight shifting and technique to facilitate standing  Ambulation/Gait Ambulation/Gait assistance: Mod assist Gait Distance (Feet): 3 Feet Assistive device: Rolling walker (2 wheeled)   Gait velocity: decreased   General Gait Details: verbal cues for technique using rolling walker. patient needs tactile cues for advancement of LLE and intermittent assistance for rolling  walker negotiation with left side lean. steadying assistance provided with ambulation   Stairs             Wheelchair Mobility    Modified Rankin (Stroke Patients Only)       Balance Overall balance assessment: Needs assistance Sitting-balance support: Feet supported;Single extremity supported Sitting balance-Leahy Scale: Fair     Standing balance support: During functional activity;Bilateral upper extremity supported Standing balance-Leahy Scale: Poor                              Cognition Arousal/Alertness: Awake/alert Behavior During Therapy: Flat affect;WFL for tasks assessed/performed Overall Cognitive Status: History of cognitive impairments - at baseline                                 General Comments: patient cooperative throughout session. she was able to follow all commands with extra time      Exercises      General Comments General comments (skin integrity, edema, etc.): patient had been incontinent of bowel in brief prior to PT arrival. with Min A for standing balance using rolling walker for support, second person assisted with peri-care and breif change with maximal assistance      Pertinent Vitals/Pain Pain Assessment: No/denies pain Faces Pain Scale: No hurt    Home Living Family/patient expects to be discharged to:: Skilled nursing facility                    Prior Function            PT Goals (current goals can now be  found in the care plan section) Acute Rehab PT Goals Patient Stated Goal: to return to bed PT Goal Formulation: With patient Time For Goal Achievement: 08/07/20 Potential to Achieve Goals: Fair Progress towards PT goals: Progressing toward goals    Frequency    Min 2X/week      PT Plan Current plan remains appropriate    Co-evaluation              AM-PAC PT "6 Clicks" Mobility   Outcome Measure  Help needed turning from your back to your side while in a flat bed  without using bedrails?: A Lot Help needed moving from lying on your back to sitting on the side of a flat bed without using bedrails?: A Lot Help needed moving to and from a bed to a chair (including a wheelchair)?: A Lot Help needed standing up from a chair using your arms (e.g., wheelchair or bedside chair)?: A Lot Help needed to walk in hospital room?: A Lot Help needed climbing 3-5 steps with a railing? : Total 6 Click Score: 11    End of Session Equipment Utilized During Treatment: Gait belt Activity Tolerance: Patient tolerated treatment well Patient left: in bed;with call bell/phone within reach;with bed alarm set Nurse Communication: Mobility status PT Visit Diagnosis: Unsteadiness on feet (R26.81);Other abnormalities of gait and mobility (R26.89);Muscle weakness (generalized) (M62.81);History of falling (Z91.81);Difficulty in walking, not elsewhere classified (R26.2);Adult, failure to thrive (R62.7)     Time: 6578-4696 PT Time Calculation (min) (ACUTE ONLY): 27 min  Charges:  $Therapeutic Activity: 23-37 mins                    Donna Bernard, PT, MPT    Ina Homes 07/28/2020, 3:41 PM

## 2020-07-29 DIAGNOSIS — R404 Transient alteration of awareness: Secondary | ICD-10-CM | POA: Diagnosis not present

## 2020-07-29 DIAGNOSIS — I2510 Atherosclerotic heart disease of native coronary artery without angina pectoris: Secondary | ICD-10-CM | POA: Diagnosis not present

## 2020-07-29 DIAGNOSIS — D631 Anemia in chronic kidney disease: Secondary | ICD-10-CM | POA: Diagnosis not present

## 2020-07-29 DIAGNOSIS — I129 Hypertensive chronic kidney disease with stage 1 through stage 4 chronic kidney disease, or unspecified chronic kidney disease: Secondary | ICD-10-CM | POA: Diagnosis not present

## 2020-07-29 DIAGNOSIS — I69993 Ataxia following unspecified cerebrovascular disease: Secondary | ICD-10-CM | POA: Diagnosis not present

## 2020-07-29 DIAGNOSIS — G309 Alzheimer's disease, unspecified: Secondary | ICD-10-CM | POA: Diagnosis not present

## 2020-07-29 DIAGNOSIS — I130 Hypertensive heart and chronic kidney disease with heart failure and stage 1 through stage 4 chronic kidney disease, or unspecified chronic kidney disease: Secondary | ICD-10-CM | POA: Diagnosis not present

## 2020-07-29 DIAGNOSIS — S065X0A Traumatic subdural hemorrhage without loss of consciousness, initial encounter: Secondary | ICD-10-CM | POA: Diagnosis not present

## 2020-07-29 DIAGNOSIS — M6281 Muscle weakness (generalized): Secondary | ICD-10-CM | POA: Diagnosis not present

## 2020-07-29 DIAGNOSIS — G4089 Other seizures: Secondary | ICD-10-CM | POA: Diagnosis not present

## 2020-07-29 DIAGNOSIS — N2581 Secondary hyperparathyroidism of renal origin: Secondary | ICD-10-CM | POA: Diagnosis not present

## 2020-07-29 DIAGNOSIS — I25111 Atherosclerotic heart disease of native coronary artery with angina pectoris with documented spasm: Secondary | ICD-10-CM | POA: Diagnosis not present

## 2020-07-29 DIAGNOSIS — Z20822 Contact with and (suspected) exposure to covid-19: Secondary | ICD-10-CM | POA: Diagnosis not present

## 2020-07-29 DIAGNOSIS — I509 Heart failure, unspecified: Secondary | ICD-10-CM | POA: Diagnosis not present

## 2020-07-29 DIAGNOSIS — I639 Cerebral infarction, unspecified: Secondary | ICD-10-CM | POA: Diagnosis not present

## 2020-07-29 DIAGNOSIS — I5043 Acute on chronic combined systolic (congestive) and diastolic (congestive) heart failure: Secondary | ICD-10-CM | POA: Diagnosis not present

## 2020-07-29 DIAGNOSIS — S065X9A Traumatic subdural hemorrhage with loss of consciousness of unspecified duration, initial encounter: Secondary | ICD-10-CM | POA: Diagnosis not present

## 2020-07-29 DIAGNOSIS — Z7401 Bed confinement status: Secondary | ICD-10-CM | POA: Diagnosis not present

## 2020-07-29 DIAGNOSIS — N183 Chronic kidney disease, stage 3 unspecified: Secondary | ICD-10-CM | POA: Diagnosis not present

## 2020-07-29 DIAGNOSIS — F0390 Unspecified dementia without behavioral disturbance: Secondary | ICD-10-CM | POA: Diagnosis not present

## 2020-07-29 DIAGNOSIS — E43 Unspecified severe protein-calorie malnutrition: Secondary | ICD-10-CM | POA: Diagnosis not present

## 2020-07-29 DIAGNOSIS — I10 Essential (primary) hypertension: Secondary | ICD-10-CM | POA: Diagnosis not present

## 2020-07-29 DIAGNOSIS — N1832 Chronic kidney disease, stage 3b: Secondary | ICD-10-CM | POA: Diagnosis not present

## 2020-07-29 DIAGNOSIS — D649 Anemia, unspecified: Secondary | ICD-10-CM | POA: Diagnosis not present

## 2020-07-29 DIAGNOSIS — S065X9D Traumatic subdural hemorrhage with loss of consciousness of unspecified duration, subsequent encounter: Secondary | ICD-10-CM | POA: Diagnosis not present

## 2020-07-29 DIAGNOSIS — K5900 Constipation, unspecified: Secondary | ICD-10-CM | POA: Diagnosis not present

## 2020-07-29 DIAGNOSIS — M2550 Pain in unspecified joint: Secondary | ICD-10-CM | POA: Diagnosis not present

## 2020-07-29 DIAGNOSIS — F0150 Vascular dementia without behavioral disturbance: Secondary | ICD-10-CM | POA: Diagnosis not present

## 2020-07-29 DIAGNOSIS — M109 Gout, unspecified: Secondary | ICD-10-CM | POA: Diagnosis not present

## 2020-07-29 DIAGNOSIS — F17200 Nicotine dependence, unspecified, uncomplicated: Secondary | ICD-10-CM | POA: Diagnosis not present

## 2020-07-29 DIAGNOSIS — G40909 Epilepsy, unspecified, not intractable, without status epilepticus: Secondary | ICD-10-CM | POA: Diagnosis not present

## 2020-07-29 DIAGNOSIS — N179 Acute kidney failure, unspecified: Secondary | ICD-10-CM | POA: Diagnosis not present

## 2020-07-29 DIAGNOSIS — Z91128 Patient's intentional underdosing of medication regimen for other reason: Secondary | ICD-10-CM | POA: Diagnosis not present

## 2020-07-29 DIAGNOSIS — R4182 Altered mental status, unspecified: Secondary | ICD-10-CM | POA: Diagnosis not present

## 2020-07-29 LAB — CBC WITH DIFFERENTIAL/PLATELET
Abs Immature Granulocytes: 0.01 10*3/uL (ref 0.00–0.07)
Basophils Absolute: 0 10*3/uL (ref 0.0–0.1)
Basophils Relative: 0 %
Eosinophils Absolute: 0.1 10*3/uL (ref 0.0–0.5)
Eosinophils Relative: 3 %
HCT: 29.7 % — ABNORMAL LOW (ref 36.0–46.0)
Hemoglobin: 9.8 g/dL — ABNORMAL LOW (ref 12.0–15.0)
Immature Granulocytes: 0 %
Lymphocytes Relative: 18 %
Lymphs Abs: 1 10*3/uL (ref 0.7–4.0)
MCH: 33 pg (ref 26.0–34.0)
MCHC: 33 g/dL (ref 30.0–36.0)
MCV: 100 fL (ref 80.0–100.0)
Monocytes Absolute: 0.5 10*3/uL (ref 0.1–1.0)
Monocytes Relative: 10 %
Neutro Abs: 3.8 10*3/uL (ref 1.7–7.7)
Neutrophils Relative %: 69 %
Platelets: 254 10*3/uL (ref 150–400)
RBC: 2.97 MIL/uL — ABNORMAL LOW (ref 3.87–5.11)
RDW: 15.9 % — ABNORMAL HIGH (ref 11.5–15.5)
WBC: 5.5 10*3/uL (ref 4.0–10.5)
nRBC: 0 % (ref 0.0–0.2)

## 2020-07-29 LAB — RESP PANEL BY RT-PCR (FLU A&B, COVID) ARPGX2
Influenza A by PCR: NEGATIVE
Influenza B by PCR: NEGATIVE
SARS Coronavirus 2 by RT PCR: NEGATIVE

## 2020-07-29 LAB — BASIC METABOLIC PANEL
Anion gap: 9 (ref 5–15)
BUN: 43 mg/dL — ABNORMAL HIGH (ref 8–23)
CO2: 23 mmol/L (ref 22–32)
Calcium: 9.9 mg/dL (ref 8.9–10.3)
Chloride: 104 mmol/L (ref 98–111)
Creatinine, Ser: 1.45 mg/dL — ABNORMAL HIGH (ref 0.44–1.00)
GFR, Estimated: 37 mL/min — ABNORMAL LOW (ref >60)
Glucose, Bld: 96 mg/dL (ref 70–99)
Potassium: 4.6 mmol/L (ref 3.5–5.1)
Sodium: 136 mmol/L (ref 135–145)

## 2020-07-29 MED ORDER — NEPRO/CARBSTEADY PO LIQD
237.0000 mL | Freq: Three times a day (TID) | ORAL | Status: DC
  Administered 2020-07-29: 237 mL via ORAL

## 2020-07-29 NOTE — Progress Notes (Signed)
PROGRESS NOTE  [name]  DOB: 05/17/1940  PCP: [name], MD DEY:[id number]  DOA: 07/21/2020  LOS: 0 days  Hospital Day: 9   Chief Complaint  Patient presents with   Altered Mental Status    Brief narrative: [name] is a 80 y.o. female with PMH significant for dementia, seizure disorder, hypertension, stroke, CKD stage IIIb, chronic systolic and diastolic CHF, falls. Patient was brought to the ED from a nursing home on 6/21 for altered mental status after she started yelling at the staff which was unusual for her.  Previously she had a fall on 6/8 and CT scan of head at that time did not show any acute abnormality.   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  Admitted to hospital service.  Neurosurgery Dr. Marcell Barlow consulted. Subsequent repeat CT head showed stable hematomas.  No need of surgical intervention. Currently pending placement.  Subjective: Patient was seen and examined this morning.  Pleasant elderly African-American female.  Lying on bed.  Not in distress.  Knows she is in the hospital.  Assessment/Plan: Acute on chronic subdural hematoma -Probably related to fall.  Initial reported fall on 6/8 at which time CT scan of head did not show any hematoma. -CT head on this presentation showed acute on chronic subdural hematoma.  Subsequent CT head showed stable hematomas.  Neurosurgeon Dr. Marcell Barlow was consulted.  No need of surgical intervention. -PT and OT follow-up to continue.  Intermittent delirium -Probably due to dementia and chronic hematomas.  AKI on CKD stage IIIb -Creatinine worsened to peak at 2.12, improved and currently at baseline. Recent Labs    03/29/20 0455 03/30/20 0438 03/31/20 0445 04/01/20 0317 05/08/20 1151 07/21/20 1935 07/23/20 0536 07/24/20 0523 07/25/20 0546 07/29/20 0846  BUN 41* 43* 38* 37* 29* 48* 43* 63* 48* 43*  CREATININE 1.27* 1.84* 1.46* 1.36*  1.36* 1.52* 1.49* 2.12* 1.63* 1.45*   Hyperkalemia -Her potassium level elevated to 5.6, improved subsequently with Lokelma.  Losartan remains on hold. Recent Labs  Lab 07/23/20 0536 07/24/20 0523 07/25/20 0546 07/29/20 0846  K 5.6* 5.0 3.7 4.6   Hypertension: Continue antihypertensives  Seizure disorder: Continue Keppra and Vimpat  Dementia: Continue donepezil.   History of stroke: Aspirin has been held because of subdural hematoma.  Aspirin may be resumed at a later date at the discretion of PCP or nursing home physician.   Awaiting insurance approval for discharge to SNF.  Mobility: Encourage ambulation Code Status:   Code Status: Full Code  Nutritional status: Body mass index is 17.77 kg/m. Nutrition Problem: Severe Malnutrition Etiology: chronic illness (CHF, dementia) Signs/Symptoms: severe muscle depletion, severe fat depletion Diet Order             Diet - low sodium heart healthy           Diet Heart Room service appropriate? Yes; Fluid consistency: Thin  Diet effective now                   DVT prophylaxis:  SCDs Start: 07/22/20 0003   Antimicrobials: None currently Fluid: None Consultants: Neurosurgery signed off Family Communication: None at bedside  Status is: Inpatient  Remains inpatient appropriate because: Pending placement  Dispo: The patient is from: Nursing home              Anticipated d/c is to: Back to nursing home              Patient  currently is medically stable to d/c.   Difficult to place patient No     Infusions:    Scheduled Meds:  allopurinol  100 mg Oral Daily   amLODipine  10 mg Oral Daily   carvedilol  25 mg Oral BID WC   docusate sodium  100 mg Oral BID   donepezil  5 mg Oral Daily   feeding supplement (NEPRO CARB STEADY)  237 mL Oral BID BM   ferrous sulfate  325 mg Oral q morning   lacosamide  200 mg Oral BID   levETIRAcetam  1,000 mg Oral BID    Antimicrobials: Anti-infectives (From admission, onward)     None       PRN meds: acetaminophen, hydrALAZINE, ondansetron (ZOFRAN) IV, polyethylene glycol   Objective: Vitals:   07/29/20 0445 07/29/20 0822  BP: 119/73 (!) 150/76  Pulse: 71 70  Resp: 16 17  Temp: 98.5 F (36.9 C) 98.1 F (36.7 C)  SpO2: 100% 98%    Intake/Output Summary (Last 24 hours) at 07/29/2020 0923 Last data filed at 07/28/2020 1425 Gross per 24 hour  Intake 360 ml  Output --  Net 360 ml   Filed Weights   07/21/20 1847  Weight: 41.3 kg   Weight change:  Body mass index is 17.77 kg/m.   Physical Exam: General exam: Pleasant elderly African-American female.  Not in distress Skin: No rashes, lesions or ulcers. HEENT: Atraumatic, normocephalic, no obvious bleeding Lungs: Clear to auscultate bilaterally CVS: Regular rate and rhythm, no murmur GI/Abd soft, nontender, nondistended, bowel sound present CNS: Alert, awake, knows she is in the hospital. Psychiatry: Not restless or agitated. Extremities: No pedal edema, no calf tenderness  Data Review: I have personally reviewed the laboratory data and studies available.  Recent Labs  Lab 07/23/20 0536 07/29/20 0846  WBC 6.0 5.5  NEUTROABS  --  3.8  HGB 11.5* 9.8*  HCT 34.9* 29.7*  MCV 99.1 100.0  PLT 340 254   Recent Labs  Lab 07/23/20 0536 07/24/20 0523 07/25/20 0546 07/29/20 0846  NA 131* 137 137 136  K 5.6* 5.0 3.7 4.6  CL 99 106 105 104  CO2 24 23 24 23   GLUCOSE 87 99 86 96  BUN 43* 63* 48* 43*  CREATININE 1.49* 2.12* 1.63* 1.45*  CALCIUM 9.5 9.5 9.1 9.9    F/u labs not needed Unresulted Labs (From admission, onward)    None       Signed, , MD [facility] 07/29/2020

## 2020-07-29 NOTE — Progress Notes (Signed)
Nutrition Follow-up  DOCUMENTATION CODES:  Underweight, Severe malnutrition in context of chronic illness  INTERVENTION:  Obtain updated weight when possible.  Increase Nepro shakes to TID to promote intake.  Continue to encourage adequate PO intake.  NUTRITION DIAGNOSIS:  Severe Malnutrition related to chronic illness (CHF, dementia) as evidenced by severe muscle depletion, severe fat depletion. - ongoing  GOAL:  Patient will meet greater than or equal to 90% of their needs - not meeting  MONITOR:  PO intake, Supplement acceptance, Weight trends  REASON FOR ASSESSMENT:  Malnutrition Screening Tool (low BMI)    ASSESSMENT:  Patient presented to ED from facility after an acute onset of AMS. Staff report a fall on 07/08/2020. Patient pupils noted to be pinpoint on arrival to ED. Medical hx relevant for HTN, CVA, CHF, CKD3, and dementia.  Spoke with pt at bedside. Pt reports that she did not receive her eggs this morning, despite ordering them. She reports that she has been drinking both Nepro shakes daily, and she really likes them. Highlighted the importance of good nutrition for muscle repletion and healing - pt understands. RD to increase Nepro shakes to TID to promote more intake.  Pt due for a new weight given pt has been admitted for 7 days. RD to order new weight.  Average Meal Intake: 6/25-6/28: ~56% (7 meals documented)  Medications: reviewed; colace BID, Nepro shakes BID, ferrous sulfate, Keppra BID  Labs: reviewed; CBG 98-175  Diet Order:   Diet Order             Diet - low sodium heart healthy           Diet Heart Room service appropriate? Yes; Fluid consistency: Thin  Diet effective now                  EDUCATION NEEDS:  No education needs have been identified at this time  Skin:  Skin Assessment: Reviewed RN Assessment  Last BM:  07/26/20 - Type 2, large  Height:  Ht Readings from Last 1 Encounters:  07/21/20 5' (1.524 m)   Weight:  Wt  Readings from Last 1 Encounters:  07/21/20 41.3 kg   Ideal Body Weight:  45.5 kg  BMI:  Body mass index is 17.77 kg/m.  Estimated Nutritional Needs:  Kcal:  1300-1500 kcal/d Protein:  70-85 g/d Fluid:  1.5L/d  Vertell Limber, RD, LDN (she/her/hers) Registered Dietitian I After-Hours/Weekend Pager # in [place]

## 2020-07-29 NOTE — TOC Progression Note (Signed)
Transition of Care [facility]) - Progression Note    Patient Details  Name: [name] MRN: [id number] Date of Birth: 1940/03/22  [facility] CM/SW Contact  Caryn Section, RN Phone Number: 07/29/2020, 12:59 PM  Clinical Narrative:  Per Archie Patten at [facility] contacted her, she attempted to call back and left a voicemail message.  She will call again at 1pm.     Expected Discharge Plan: Skilled Nursing Facility Barriers to Discharge: Insurance Authorization  Expected Discharge Plan and Services Expected Discharge Plan: Skilled Nursing Facility   Discharge Planning Services: CM Consult Post Acute Care Choice: Skilled Nursing Facility   Expected Discharge Date: 07/25/20               DME Arranged: N/A DME Agency: NA       HH Arranged: NA           Social Determinants of Health (SDOH) Interventions    Readmission Risk Interventions No flowsheet data found.

## 2020-07-29 NOTE — Progress Notes (Signed)
Report called to Jasmin at [employer].  All questions answered, AVS in d/c packet to go with EMS

## 2020-07-29 NOTE — Discharge Summary (Signed)
Physician Discharge Summary  Taylor Riddlengela Robinson Raether WUJ:[id number] DOB: 06-09-1940 DOA: 07/21/2020  PCP: Elias Elseeade, Robert, MD  Admit date: 07/21/2020 Discharge date: 07/29/2020  Admitted From: Nursing home Discharge disposition: Back to nursing home   Code Status: Full Code  Diet Recommendation: Regular diet  Discharge Diagnosis:   Principal Problem:   Subdural hematoma (HCC) Active Problems:   Hyperkalemia   AKI (acute kidney injury) (HCC)   History of seizure   AMS (altered mental status)   Essential hypertension   History of dementia  Chief Complaint  Patient presents with   Altered Mental Status    Brief narrative: [name] is a 80 y.o. female with PMH significant for dementia, seizure disorder, hypertension, stroke, CKD stage IIIb, chronic systolic and diastolic CHF, falls. Patient was brought to the ED from a nursing home on 6/21 for altered mental status after she started yelling at the staff which was unusual for her.  Previously she had a fall on 6/8 and CT scan of head at that time did not show any acute abnormality.   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  Admitted to hospital service.  Neurosurgery Dr. Marcell BarlowYarborough consulted. Subsequent repeat CT head showed stable hematomas.  No need of surgical intervention.  Subjective: Patient was seen and examined this morning.  Pleasant elderly African-American female.  Lying on bed.  Not in distress.  Knows she is in the hospital.  Hospital course: Acute on chronic subdural hematoma -Probably related to fall.  Initial reported fall on 6/8 at which time CT scan of head did not show any hematoma. -CT head on this presentation showed acute on chronic subdural hematoma.  Subsequent CT head showed stable hematomas.  Neurosurgeon Dr. Marcell BarlowYarborough was consulted.  No need of surgical intervention. -PT and OT follow-up to continue.  Intermittent delirium -Probably due to  dementia and chronic hematomas.  AKI on CKD stage IIIb -Creatinine worsened to peak at 2.12, improved and currently at baseline. Recent Labs    03/29/20 0455 03/30/20 0438 03/31/20 0445 04/01/20 0317 05/08/20 1151 07/21/20 1935 07/23/20 0536 07/24/20 0523 07/25/20 0546 07/29/20 0846  BUN 41* 43* 38* 37* 29* 48* 43* 63* 48* 43*  CREATININE 1.27* 1.84* 1.46* 1.36* 1.36* 1.52* 1.49* 2.12* 1.63* 1.45*  Hyperkalemia -Her potassium level elevated to 5.6, improved subsequently with Lokelma.  Losartan remains on hold. Recent Labs  Lab 07/23/20 0536 07/24/20 0523 07/25/20 0546 07/29/20 0846  K 5.6* 5.0 3.7 4.6  Hypertension: Currently blood pressure is controlled on amlodipine, carvedilol.  Losartan on hold.    Seizure disorder: Continue Keppra and Vimpat  Dementia: Continue donepezil.   History of stroke: Aspirin has been held because of subdural hematoma.  Aspirin may be resumed at a later date at the discretion of PCP or nursing home physician.   Wound care:    Discharge Exam:   Vitals:   07/28/20 2337 07/29/20 0046 07/29/20 0445 07/29/20 0822  BP: (!) 141/85  119/73 (!) 150/76  Pulse: 70  71 70  Resp: 16 16 16 17   Temp: 98 F (36.7 C)  98.5 F (36.9 C) 98.1 F (36.7 C)  TempSrc:      SpO2: 100%  100% 98%  Weight:      Height:        Body mass index is 17.77 kg/m.  General exam: Pleasant elderly African-American female.  Not in distress Skin: No rashes, lesions or ulcers. HEENT: Atraumatic, normocephalic, no obvious  bleeding Lungs: Clear to auscultate bilaterally CVS: Regular rate and rhythm, no murmur GI/Abd soft, nontender, nondistended, bowel sound present CNS: Alert, awake, knows she is in the hospital. Psychiatry: Not restless or agitated. Extremities: No pedal edema, no calf tenderness    Follow ups:   Discharge Instructions     Diet - low sodium heart healthy   Complete by: As directed    Diet general   Complete by: As directed    Increase  activity slowly   Complete by: As directed    Increase activity slowly   Complete by: As directed        Contact information for follow-up providers     [name], MD Follow up.   Specialty: Family Medicine Contact information: [phone] [address] [place] [address] [phone]         Corky Crafts, MD .   Specialties: Cardiology, Radiology, Interventional Cardiology Contact information: [address] [place] [address] [phone]              Contact information for after-discharge care     Destination     [facility] Preferred SNF .   Service: Skilled Nursing Contact information: [address] [place] [address] [phone]                     Recommendations for Outpatient Follow-Up:   Follow-up with PCP as an outpatient  Discharge Instructions:  Follow with Primary MD [name], MD in 7 days   Get CBC/BMP checked in next visit within 1 week by PCP or SNF MD ( we routinely change or add medications that can affect your baseline labs and fluid status, therefore we recommend that you get the mentioned basic workup next visit with your PCP, your PCP may decide not to get them or add new tests based on their clinical decision)  On your next visit with your PCP, please Get Medicines reviewed and adjusted.  Please request your PCP  to go over all Hospital Tests and Procedure/Radiological results at the follow up, please get all Hospital records sent to your Prim MD by signing hospital release before you go home.  Activity: As tolerated with Full fall precautions use walker/cane & assistance as needed  For Heart failure patients - Check your Weight same time everyday, if you gain over 2 pounds, or you develop in leg swelling, experience more shortness of breath or chest pain, call your Primary MD immediately. Follow Cardiac Low Salt Diet and 1.5 lit/day fluid  restriction.  If you have smoked or chewed Tobacco in the last 2 yrs please stop smoking, stop any regular Alcohol  and or any Recreational drug use.  If you experience worsening of your admission symptoms, develop shortness of breath, life threatening emergency, suicidal or homicidal thoughts you must seek medical attention immediately by calling 911 or calling your MD immediately  if symptoms less severe.  You Must read complete instructions/literature along with all the possible adverse reactions/side effects for all the Medicines you take and that have been prescribed to you. Take any new Medicines after you have completely understood and accpet all the possible adverse reactions/side effects.   Do not drive, operate heavy machinery, perform activities at heights, swimming or participation in water activities or provide baby sitting services if your were admitted for syncope or siezures until you have seen by Primary MD or a Neurologist and advised to do so again.  Do not drive when taking Pain medications.  Do not take more than prescribed Pain, Sleep and Anxiety Medications  Wear Seat belts while driving.   Please note You were cared for by a hospitalist during your hospital stay. If you have any questions about your discharge medications or the care you received while you were in the hospital after you are discharged, you can call the unit and asked to speak with the hospitalist on call if the hospitalist that took care of you is not available. Once you are discharged, your primary care physician will handle any further medical issues. Please note that NO REFILLS for any discharge medications will be authorized once you are discharged, as it is imperative that you return to your primary care physician (or establish a relationship with a primary care physician if you do not have one) for your aftercare needs so that they can reassess your need for medications and monitor your lab  values.    Allergies as of 07/29/2020       Reactions   Banana    Raises K+   Budesonide-formoterol Fumarate    Other reaction(s): Sore throat Other reaction(s): Sore throat   Fish Allergy    Lisinopril    Other reaction(s): nausea Other reaction(s): nausea   Mirtazapine    Other reaction(s): Severe Headaches Other reaction(s): Severe Headaches   Sodium Bicarbonate    Other reaction(s): Makes her vomit Other reaction(s): Makes her vomit        Medication List     STOP taking these medications    aspirin EC 81 MG tablet       TAKE these medications    acetaminophen 325 MG tablet Commonly known as: TYLENOL Take 650 mg by mouth every 6 (six) hours as needed.   allopurinol 100 MG tablet Commonly known as: ZYLOPRIM Take 1 tablet (100 mg total) by mouth daily.   amLODipine 10 MG tablet Commonly known as: NORVASC Take 1 tablet (10 mg total) by mouth daily.   carvedilol 12.5 MG tablet Commonly known as: COREG Take 1 tablet (12.5 mg total) by mouth 2 (two) times daily with a meal.   docusate sodium 100 MG capsule Commonly known as: COLACE Take 1 capsule (100 mg total) by mouth 2 (two) times daily.   donepezil 5 MG tablet Commonly known as: ARICEPT Take 1 tablet (5 mg total) by mouth daily.   Ensure Take 237 mLs by mouth 3 (three) times daily between meals.   FeroSul 325 (65 FE) MG tablet Generic drug: ferrous sulfate Take 1 tablet (325 mg total) by mouth every morning.   lacosamide 200 MG Tabs tablet Commonly known as: Vimpat Take 1 tablet (200 mg total) by mouth 2 (two) times daily.   levETIRAcetam 1000 MG tablet Commonly known as: KEPPRA Take 1 tablet (1,000 mg total) by mouth 2 (two) times daily.   losartan 100 MG tablet Commonly known as: COZAAR Take 1 tablet (100 mg total) by mouth daily.   multivitamin with minerals Tabs tablet Take 1 tablet by mouth daily.   polyethylene glycol 17 g packet Commonly known as: MIRALAX / GLYCOLAX Take 17  g by mouth daily as needed for mild constipation.        Time coordinating discharge: 35 minutes  The results of significant diagnostics from this hospitalization (including imaging, microbiology, ancillary and laboratory) are listed below for reference.    Procedures and Diagnostic Studies:   CT HEAD WO CONTRAST  Result Date: 07/22/2020 CLINICAL DATA:  Subdural hematoma EXAM: CT HEAD WITHOUT CONTRAST TECHNIQUE: Contiguous axial images were obtained from the base of the skull through the vertex without intravenous contrast. Sagittal and coronal MPR images reconstructed from axial data set. COMPARISON:  07/21/2020 FINDINGS: Brain: Generalized atrophy. Normal ventricular morphology. Small vessel chronic ischemic changes of deep cerebral white matter. Subacute LEFT frontal subdural hematoma up to 7 mm thick on coronal imaging, unchanged. Small focus of high attenuation acute subdural hemorrhage is seen more superiorly and posteriorly, unchanged. Old infarcts RIGHT basal ganglia and questionably RIGHT thalamus. No mass lesion or new areas of infarction. 2 mm of LEFT-to-RIGHT midline shift. Vascular: Atherosclerotic calcification of internal carotid arteries at skull base. No hyperdense vessels. Skull: Intact. Sinuses/Orbits: Chronic mucosal thickening and osseous thickening of sphenoid sinus likely reflects sequela of chronic sinusitis. Remaining paranasal sinuses and mastoid air cells clear Other: N/A IMPRESSION: Atrophy with small vessel chronic ischemic changes of deep cerebral white matter. Old lacunar infarcts RIGHT basal ganglia and questionably RIGHT thalamus. Acute on chronic subdural hematoma LEFT frontal parietal unchanged, with 2 mm of LEFT-to-RIGHT midline shift. No new intracranial abnormalities. Electronically Signed   By: Ulyses Southward M.D.   On: 07/22/2020 11:38   CT HEAD WO CONTRAST  Result Date: 07/21/2020 CLINICAL DATA:  Delirium.  Altered mental status. EXAM: CT HEAD WITHOUT CONTRAST  TECHNIQUE: Contiguous axial images were obtained from the base of the skull through the vertex without intravenous contrast. COMPARISON:  Head CT 07/08/2020 FINDINGS: Brain: There are bilateral subdural collections. On the left this measures 8-9 mm, and is diminished from prior exam where it measured up to 11 mm. There is a small high-density component consistent with acute hemorrhage, series 4, image 31. Small right frontal subdural collection measures up to 5 mm and has a acute or subacute hemorrhagic component. This is also diminished in size fall previously measuring 8 mm. There is slight mass effect on the underlying brain parenchyma. Underlying atrophy and chronic small vessel ischemia are again seen. Small remote chronic infarcts right basal ganglia, bilateral thalami, and high left parietal lobe, unchanged. No evidence of acute ischemia. No hydrocephalus. Vascular: Atherosclerosis of skullbase vasculature without hyperdense vessel or abnormal calcification. Skull: No fracture or focal lesion. Sinuses/Orbits: Chronic mucosal thickening of left side of sphenoid sinus with cortical thickening. No acute findings. Other: Diminishing frontal scalp hematoma from prior. IMPRESSION: 1. Bilateral subdural collections, left greater than right, both of which have diminished in size from prior exam. These may represent subdural hygromas or chronic subdural hematomas, however there is a small amount of acute blood within both the right and left collection. There is mild underlying mass effect on the brain parenchyma. 2. Atrophy and chronic small vessel ischemia. Multiple remote chronic infarcts. These results were called by telephone at the time of interpretation on 07/21/2020 at 7:24 pm to provider MARK QUALE , who verbally acknowledged these results. Electronically Signed   By: Narda Rutherford M.D.   On: 07/21/2020 19:24     Labs:   Basic Metabolic Panel: Recent Labs  Lab 07/23/20 0536 07/24/20 0523  07/25/20 0546 07/29/20 0846  NA 131* 137 137 136  K 5.6* 5.0 3.7 4.6  CL 99 106 105 104  CO2 24 23 24 23   GLUCOSE 87 99 86 96  BUN 43* 63* 48* 43*  CREATININE 1.49* 2.12* 1.63* 1.45*  CALCIUM 9.5 9.5 9.1 9.9   GFR Estimated Creatinine Clearance: 21.2 mL/min (A) (by C-G formula based on SCr of 1.45 mg/dL (H)). Liver  Function Tests: No results for input(s): AST, ALT, ALKPHOS, BILITOT, PROT, ALBUMIN in the last 168 hours. No results for input(s): LIPASE, AMYLASE in the last 168 hours. No results for input(s): AMMONIA in the last 168 hours. Coagulation profile No results for input(s): INR, PROTIME in the last 168 hours.  CBC: Recent Labs  Lab 07/23/20 0536 07/29/20 0846  WBC 6.0 5.5  NEUTROABS  --  3.8  HGB 11.5* 9.8*  HCT 34.9* 29.7*  MCV 99.1 100.0  PLT 340 254   Cardiac Enzymes: No results for input(s): CKTOTAL, CKMB, CKMBINDEX, TROPONINI in the last 168 hours. BNP: Invalid input(s): POCBNP CBG: Recent Labs  Lab 07/24/20 1200  GLUCAP 175*   D-Dimer No results for input(s): DDIMER in the last 72 hours. Hgb A1c No results for input(s): HGBA1C in the last 72 hours. Lipid Profile No results for input(s): CHOL, HDL, LDLCALC, TRIG, CHOLHDL, LDLDIRECT in the last 72 hours. Thyroid function studies No results for input(s): TSH, T4TOTAL, T3FREE, THYROIDAB in the last 72 hours.  Invalid input(s): FREET3 Anemia work up No results for input(s): VITAMINB12, FOLATE, FERRITIN, TIBC, IRON, RETICCTPCT in the last 72 hours. Microbiology Recent Results (from the past 240 hour(s))  SARS CORONAVIRUS 2 (TAT 6-24 HRS) Nasopharyngeal Nasopharyngeal Swab     Status: None   Collection Time: 07/22/20  1:52 PM   Specimen: Nasopharyngeal Swab  Result Value Ref Range Status   SARS Coronavirus 2 NEGATIVE NEGATIVE Final    Comment: (NOTE) SARS-CoV-2 target nucleic acids are NOT DETECTED.  The SARS-CoV-2 RNA is generally detectable in upper and lower respiratory specimens during the  acute phase of infection. Negative results do not preclude SARS-CoV-2 infection, do not rule out co-infections with other pathogens, and should not be used as the sole basis for treatment or other patient management decisions. Negative results must be combined with clinical observations, patient history, and epidemiological information. The expected result is Negative.  Fact Sheet for Patients: HairSlick.no  Fact Sheet for Healthcare Providers: quierodirigir.com  This test is not yet approved or cleared by the [place] FDA and  has been authorized for detection and/or diagnosis of SARS-CoV-2 by FDA under an Emergency Use Authorization (EUA). This EUA will remain  in effect (meaning this test can be used) for the duration of the COVID-19 declaration under Se ction 564(b)(1) of the Act, 21 U.S.C. section 360bbb-3(b)(1), unless the authorization is terminated or revoked sooner.  Performed at [facility], [address] [place], [place] [address]   MRSA PCR Screening     Status: None   Collection Time: 07/22/20  6:43 PM  Result Value Ref Range Status   MRSA by PCR NEGATIVE NEGATIVE Final    Comment:        The GeneXpert MRSA Assay (FDA approved for NASAL specimens only), is one component of a comprehensive MRSA colonization surveillance program. It is not intended to diagnose MRSA infection nor to guide or monitor treatment for MRSA infections. Performed at [facility], [address] [place], [place] [address]   SARS CORONAVIRUS 2 (TAT 6-24 HRS) Nasopharyngeal Nasopharyngeal Swab     Status: None   Collection Time: 07/25/20  5:17 AM   Specimen: Nasopharyngeal Swab  Result Value Ref Range Status   SARS Coronavirus 2 NEGATIVE NEGATIVE Final    Comment: (NOTE) SARS-CoV-2 target nucleic acids are NOT DETECTED.  The SARS-CoV-2 RNA is generally detectable in upper and lower respiratory specimens  during the acute phase of infection. Negative results  do not preclude SARS-CoV-2 infection, do not rule out co-infections with other pathogens, and should not be used as the sole basis for treatment or other patient management decisions. Negative results must be combined with clinical observations, patient history, and epidemiological information. The expected result is Negative.  Fact Sheet for Patients: HairSlick.no  Fact Sheet for Healthcare Providers: quierodirigir.com  This test is not yet approved or cleared by the [place] FDA and  has been authorized for detection and/or diagnosis of SARS-CoV-2 by FDA under an Emergency Use Authorization (EUA). This EUA will remain  in effect (meaning this test can be used) for the duration of the COVID-19 declaration under Se ction 564(b)(1) of the Act, 21 U.S.C. section 360bbb-3(b)(1), unless the authorization is terminated or revoked sooner.  Performed at [facility], [address], [place], [place] [address]   Resp Panel by RT-PCR (Flu A&B, Covid) Nasopharyngeal Swab     Status: None   Collection Time: 07/25/20 11:28 AM   Specimen: Nasopharyngeal Swab; Nasopharyngeal(NP) swabs in vial transport medium  Result Value Ref Range Status   SARS Coronavirus 2 by RT PCR NEGATIVE NEGATIVE Final    Comment: (NOTE) SARS-CoV-2 target nucleic acids are NOT DETECTED.  The SARS-CoV-2 RNA is generally detectable in upper respiratory specimens during the acute phase of infection. The lowest concentration of SARS-CoV-2 viral copies this assay can detect is 138 copies/mL. A negative result does not preclude SARS-Cov-2 infection and should not be used as the sole basis for treatment or other patient management decisions. A negative result may occur with  improper specimen collection/handling, submission of specimen other than nasopharyngeal swab, presence of viral mutation(s) within  the areas targeted by this assay, and inadequate number of viral copies(<138 copies/mL). A negative result must be combined with clinical observations, patient history, and epidemiological information. The expected result is Negative.  Fact Sheet for Patients:  BloggerCourse.com  Fact Sheet for Healthcare Providers:  SeriousBroker.it  This test is no t yet approved or cleared by the [place] FDA and  has been authorized for detection and/or diagnosis of SARS-CoV-2 by FDA under an Emergency Use Authorization (EUA). This EUA will remain  in effect (meaning this test can be used) for the duration of the COVID-19 declaration under Section 564(b)(1) of the Act, 21 U.S.C.section 360bbb-3(b)(1), unless the authorization is terminated  or revoked sooner.       Influenza A by PCR NEGATIVE NEGATIVE Final   Influenza B by PCR NEGATIVE NEGATIVE Final    Comment: (NOTE) The Xpert Xpress SARS-CoV-2/FLU/RSV plus assay is intended as an aid in the diagnosis of influenza from Nasopharyngeal swab specimens and should not be used as a sole basis for treatment. Nasal washings and aspirates are unacceptable for Xpert Xpress SARS-CoV-2/FLU/RSV testing.  Fact Sheet for Patients: BloggerCourse.com  Fact Sheet for Healthcare Providers: SeriousBroker.it  This test is not yet approved or cleared by the [place] FDA and has been authorized for detection and/or diagnosis of SARS-CoV-2 by FDA under an Emergency Use Authorization (EUA). This EUA will remain in effect (meaning this test can be used) for the duration of the COVID-19 declaration under Section 564(b)(1) of the Act, 21 U.S.C. section 360bbb-3(b)(1), unless the authorization is terminated or revoked.  Performed at [facility], [address] [place] [place], [place] [address]      Signed: Lorin Glass  [facility] 07/29/2020, 2:00 PM

## 2020-08-04 ENCOUNTER — Other Ambulatory Visit: Payer: Self-pay

## 2020-08-04 ENCOUNTER — Encounter: Payer: Self-pay | Admitting: Adult Health

## 2020-08-04 ENCOUNTER — Ambulatory Visit (INDEPENDENT_AMBULATORY_CARE_PROVIDER_SITE_OTHER): Payer: Medicare HMO | Admitting: Adult Health

## 2020-08-04 DIAGNOSIS — G40909 Epilepsy, unspecified, not intractable, without status epilepticus: Secondary | ICD-10-CM | POA: Diagnosis not present

## 2020-08-04 NOTE — Patient Instructions (Signed)
Continue Keppra and Vimpat If you have any seizure events please let us know.   

## 2020-08-04 NOTE — Progress Notes (Signed)
PATIENT: [name] DOB: May 02, 1940  REASON FOR VISIT: follow up HISTORY FROM: patient Primary neurologist: Dr. Pearlean Brownie  HISTORY OF PRESENT ILLNESS: Today 08/04/20:  Ms. [name] is a 80 year old female with a history of seizures.  She returns today for follow-up.  She is currently on Vimpat 200 mg twice a day and Keppra 1000 mg twice a day.  Her family states that she now lives at [facility].  She had several falls since November.  Also suffered a subdural hematoma.  No surgical intervention needed.  They report that she has not had a seizure since November.  She is doing physical therapy at the facility present now she will be living there and definitely as they do not have a caregiver to stay with her at home.  03/04/20: Ms. [name] is a 80 year old female with a history of seizures.  She returns today for follow-up.  She had a seizure event on November 26 and was admitted to the hospital.  Her Keppra was increased to 1000 mg twice a day and then had increased to 200 mg twice a day.  To the family's knowledge she has not had any additional seizure events.  She reports that a week ago she was in [place] and call for her daughter.  According to the family members daughter states that she was shaking but she was talking to her.  Unclear if this was a true seizure event.  The patient reports that she is not missed any seizure medication.  However the family is confused about what medication she should be taking for her blood pressure.  Blood pressure is elevated today.  They report that they have reached out to her primary care for an appointment.  09/30/19: Ms. [name] is a 80 year old female with a history of seizures.  She returns today for follow-up.  She remains on Keppra and Vimpat.  She denies any seizure events.  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  Denies any changes in her mood or behavior.  She returns today for an evaluation.  HISTORY  09/27/18:   Ms. [name] is a 80 year old female with a history of seizures.  She returns today for follow-up.  She remains on Keppra 500 mg daily and Vimpat 100 mg twice a day.  She reports that she has been tolerating these medications well.  She does not have any seizure events.  Her blood pressure is elevated today.  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  She denies any new symptoms.  She returns today for evaluation.  REVIEW OF SYSTEMS: Out of a complete 14 system review of symptoms, the patient complains only of the following symptoms, and all other reviewed systems are negative.  See HPI  ALLERGIES: Allergies  Allergen Reactions   Banana     Raises K+   Budesonide-Formoterol Fumarate     Other reaction(s): Sore throat Other reaction(s): Sore throat   Fish Allergy    Lisinopril     Other reaction(s): nausea Other reaction(s): nausea   Mirtazapine     Other reaction(s): Severe Headaches Other reaction(s): Severe Headaches   Sodium Bicarbonate     Other reaction(s): Makes her vomit Other reaction(s): Makes her vomit    HOME MEDICATIONS: Outpatient Medications Prior to Visit  Medication Sig Dispense Refill   acetaminophen (TYLENOL) 325 MG tablet Take 650 mg by mouth every 6 (six) hours as needed.     allopurinol (ZYLOPRIM) 100 MG tablet Take 1 tablet (100 mg  total) by mouth daily. 30 tablet 0   amLODipine (NORVASC) 10 MG tablet Take 1 tablet (10 mg total) by mouth daily. 30 tablet 0   carvedilol (COREG) 12.5 MG tablet Take 1 tablet (12.5 mg total) by mouth 2 (two) times daily with a meal. 60 tablet 0   docusate sodium (COLACE) 100 MG capsule Take 1 capsule (100 mg total) by mouth 2 (two) times daily. 10 capsule 0   donepezil (ARICEPT) 5 MG tablet Take 1 tablet (5 mg total) by mouth daily. 30 tablet 0   Ensure (ENSURE) Take 237 mLs by mouth 3 (three) times daily between meals.     FEROSUL 325 (65 Fe) MG tablet Take 1 tablet (325 mg total) by mouth every morning. 30  tablet 0   lacosamide (VIMPAT) 200 MG TABS tablet Take 1 tablet (200 mg total) by mouth 2 (two) times daily. 60 tablet 0   levETIRAcetam (KEPPRA) 1000 MG tablet Take 1 tablet (1,000 mg total) by mouth 2 (two) times daily. 60 tablet 0   losartan (COZAAR) 100 MG tablet Take 1 tablet (100 mg total) by mouth daily. 30 tablet 0   Multiple Vitamin (MULTIVITAMIN WITH MINERALS) TABS tablet Take 1 tablet by mouth daily.     polyethylene glycol (MIRALAX / GLYCOLAX) 17 g packet Take 17 g by mouth daily as needed for mild constipation. 14 each 0   No facility-administered medications prior to visit.    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   Basal ganglia infarction (HCC) 01/08/2013   CAP (community acquired pneumonia)    Chest pain 05/14/2015   Chronic combined systolic and diastolic CHF (congestive heart failure) (HCC) 03/06/2014   Congestive heart failure (CHF) (HCC)    Constipation 02/24/2013   Cough    Diastolic dysfunction-grade 3 05/20/2015   Dyspnea 02/24/2013   Encephalopathy 12/28/2012   Epilepsy (HCC)    Generalized seizure (HCC) 12/28/2012   H/O: CVA (cerebrovascular accident) 01/04/2013   Hyperammonemia (HCC) 05/17/2015   Hyperglycemia 02/24/2013   Hyperkalemia 02/11/2013   Hypertension    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   Hyponatremia 02/11/2013   Influenza A 02/11/2013   Liver enzyme elevation 02/11/2013   Metabolic acidosis 12/28/2012   Metabolic encephalopathy 05/16/2015   NICM (nonischemic cardiomyopathy) -EF 25% 05/20/2015   Pleuritic chest pain 02/24/2013   PNA (pneumonia) 05/20/2015   Pneumobilia  02/12/2013   Pneumonia    Seizure (HCC) 12/28/2012   Seizures (HCC)    Smoker    Stroke (HCC)    Troponin level elevated 05/14/2015   Volume overload 02/24/2013   Weight gain 02/24/2013    PAST SURGICAL HISTORY: Past Surgical History:  Procedure Laterality Date   FEMUR IM NAIL Right 01/05/2020   INTRAMEDULLARY (IM) NAIL INTERTROCHANTERIC Right 01/05/2020   Procedure: INTRAMEDULLARY (IM) NAIL INTERTROCHANTRIC;  Surgeon: [name], MD;  Location: [facility] OR;  Service: Orthopedics;  Laterality: Right;   NO PAST SURGERIES     TOTAL HIP ARTHROPLASTY Left 03/29/2020   Procedure: TOTAL HIP ARTHROPLASTY ANTERIOR APPROACH;  Surgeon: Samson Frederic, MD;  Location: [facility] ORS;  Service: Orthopedics;  Laterality: Left;  FAMILY HISTORY: Family History  Problem Relation Age of Onset   Cancer Father    Anemia Daughter     SOCIAL HISTORY: Social History   Socioeconomic History   Marital status: Single    Spouse name: Not on file   Number of children: 3   Years of education: 13   Highest education level: Not on file  Occupational History    Comment: Retired  Tobacco Use   Smoking status: Some Days    Packs/day: 0.25    Pack years: 0.00    Types: Cigarettes   Smokeless tobacco: Never   Tobacco comments:    1/2 caigarettes a day  Vaping Use   Vaping Use: Never used  Substance and Sexual Activity   Alcohol use: Not Currently    Comment: occasional   Drug use: No   Sexual activity: Not Currently  Other Topics Concern   Not on file  Social History Narrative   Patient lives at home alone.    Retired.   Education one year of college.   Left handed.   Caffeine one cup of coffee daily and one cup of tea.   Social Determinants of Health   Financial Resource Strain: Not on file  Food Insecurity: Not on file  Transportation Needs: Not on file  Physical Activity: Not on file  Stress: Not on file  Social Connections: Not on file  Intimate Partner Violence: Not on file       PHYSICAL EXAM  Vitals:   08/04/20 0853  BP: 134/77  Pulse: 88  Weight: 83 lb (37.6 kg)  Height: 5' (1.524 m)   Body mass index is 16.21 kg/m.  Generalized: Frail, in no acute distress   Neurological examination  Mentation: Alert oriented to time, place, history taking. Follows all commands speech and language fluent Cranial nerve II-XII: Pupils were equal round reactive to light. Extraocular movements were full, visual field were full on confrontational test. Uvula tongue midline. Head turning and shoulder shrug  were normal and symmetric. Motor: The motor testing reveals 5 over 5 strength of all 4 extremities. Good symmetric motor tone is noted throughout.  Sensory: Sensory testing is intact to soft touch on all 4 extremities. No evidence of extinction is noted.  Coordination: Cerebellar testing reveals good finger-nose-finger and heel-to-shin bilaterally.  Gait and station: Gait is normal. Reflexes: Deep tendon reflexes are symmetric and normal bilaterally.   DIAGNOSTIC DATA (LABS, IMAGING, TESTING) - I reviewed patient records, labs, notes, testing and imaging myself where available.  Lab Results  Component Value Date   WBC 5.5 07/29/2020   HGB 9.8 (L) 07/29/2020   HCT 29.7 (L) 07/29/2020   MCV 100.0 07/29/2020   PLT 254 07/29/2020      Component Value Date/Time   NA 136 07/29/2020 0846   NA 135 (A) 05/23/2015 0000   K 4.6 07/29/2020 0846   CL 104 07/29/2020 0846   CO2 23 07/29/2020 0846   GLUCOSE 96 07/29/2020 0846   BUN 43 (H) 07/29/2020 0846   BUN 40 (A) 05/23/2015 0000   CREATININE 1.45 (H) 07/29/2020 0846   CREATININE 1.42 (H) 06/04/2015 1515   CALCIUM 9.9 07/29/2020 0846   PROT 7.9 07/21/2020 1935   ALBUMIN 3.9 07/21/2020 1935   AST 17 07/21/2020 1935   ALT 14 07/21/2020 1935   ALKPHOS 68 07/21/2020 1935   BILITOT 0.4 07/21/2020 1935   GFRNONAA 37 (L) 07/29/2020 0846   GFRAA 38 (L) 07/23/2018 1657   Lab Results  Component Value Date   CHOL  135 01/07/2020   HDL 41 01/07/2020   LDLCALC 45 01/07/2020   TRIG 247 (H) 01/07/2020   CHOLHDL 3.3 01/07/2020   Lab Results  Component Value Date   HGBA1C 5.4 12/28/2019   Lab Results  Component Value Date   VITAMINB12 601 03/28/2020   Lab Results  Component Value Date   TSH 1.765 05/16/2015      ASSESSMENT AND PLAN 80 y.o. year old female  has a past medical history of Abdominal pain (02/24/2013), Acute combined systolic and diastolic congestive heart failure (HCC) (02/24/2013), Acute on chronic renal insufficiency (05/15/2015), Acute renal failure (HCC) (12/28/2012), Acute renal failure superimposed on stage 3 chronic kidney disease (HCC), Anemia (05/20/2015), Ataxia, late effect of cerebrovascular disease (02/19/2013), Basal ganglia infarction (HCC) (01/08/2013), CAP (community acquired pneumonia), Chest pain (05/14/2015), Chronic combined systolic and diastolic CHF (congestive heart failure) (HCC) (03/06/2014), Congestive heart failure (CHF) (HCC), Constipation (02/24/2013), Cough, Diastolic dysfunction-grade 3 (9/83/3825), Dyspnea (02/24/2013), Encephalopathy (12/28/2012), Epilepsy (HCC), Generalized seizure (HCC) (12/28/2012), H/O: CVA (cerebrovascular accident) (01/04/2013), Hyperammonemia (HCC) (05/17/2015), Hyperglycemia (02/24/2013), Hyperkalemia (02/11/2013), Hypertension, Hypertensive heart and kidney disease (12/28/2012), Hyponatremia (02/11/2013), Influenza A (02/11/2013), Liver enzyme elevation (02/11/2013), Metabolic acidosis (12/28/2012), Metabolic encephalopathy (05/16/2015), NICM (nonischemic cardiomyopathy) -EF 25% (05/20/2015), Pleuritic chest pain (02/24/2013), PNA (pneumonia) (05/20/2015), Pneumobilia (02/12/2013), Pneumonia, Seizure (HCC) (12/28/2012), Seizures (HCC), Smoker, Stroke (HCC), Troponin level elevated (05/14/2015), Volume overload (02/24/2013), and Weight gain (02/24/2013). here with:  Seizures  Continue Vimpat 200 mg twice a day and Keppra 1000 mg twice a day I did send a note  to the facility as Vimpat and Keppra was not listed on the medication list they provided [place] with.  Advised that she should be taking these medications and I can send a refill to the pharmacy that they request. Advised if she has any seizure event she should let us know    I spent 30 minutes of face-to-face and non-face-to-face time with patient.  This included previsit chart review, lab review, study review, order entry, electronic health record documentation, patient education.  Butch Penny, MSN, NP-C 08/04/2020, 9:01 AM [facility] [address], [address] [place], [place] [address] [phone]  I reviewed the above note and documentation by the Nurse Practitioner and agree with the history, exam, assessment and plan as outlined above. I was available for consultation. Huston Foley, MD, PhD [facility])

## 2020-08-05 DIAGNOSIS — K5900 Constipation, unspecified: Secondary | ICD-10-CM | POA: Diagnosis not present

## 2020-08-05 DIAGNOSIS — Z91128 Patient's intentional underdosing of medication regimen for other reason: Secondary | ICD-10-CM | POA: Diagnosis not present

## 2020-09-08 DIAGNOSIS — G40909 Epilepsy, unspecified, not intractable, without status epilepticus: Secondary | ICD-10-CM | POA: Diagnosis not present

## 2020-09-08 DIAGNOSIS — D631 Anemia in chronic kidney disease: Secondary | ICD-10-CM | POA: Diagnosis not present

## 2020-09-08 DIAGNOSIS — N183 Chronic kidney disease, stage 3 unspecified: Secondary | ICD-10-CM | POA: Diagnosis not present

## 2020-09-08 DIAGNOSIS — I129 Hypertensive chronic kidney disease with stage 1 through stage 4 chronic kidney disease, or unspecified chronic kidney disease: Secondary | ICD-10-CM | POA: Diagnosis not present

## 2020-09-08 DIAGNOSIS — I639 Cerebral infarction, unspecified: Secondary | ICD-10-CM | POA: Diagnosis not present

## 2020-09-08 DIAGNOSIS — F17200 Nicotine dependence, unspecified, uncomplicated: Secondary | ICD-10-CM | POA: Diagnosis not present

## 2020-09-08 DIAGNOSIS — N2581 Secondary hyperparathyroidism of renal origin: Secondary | ICD-10-CM | POA: Diagnosis not present

## 2020-09-08 DIAGNOSIS — M109 Gout, unspecified: Secondary | ICD-10-CM | POA: Diagnosis not present

## 2020-09-08 DIAGNOSIS — I509 Heart failure, unspecified: Secondary | ICD-10-CM | POA: Diagnosis not present

## 2020-09-22 DIAGNOSIS — Z1159 Encounter for screening for other viral diseases: Secondary | ICD-10-CM | POA: Diagnosis not present

## 2020-09-22 DIAGNOSIS — M6281 Muscle weakness (generalized): Secondary | ICD-10-CM | POA: Diagnosis not present

## 2020-09-22 DIAGNOSIS — S065X9D Traumatic subdural hemorrhage with loss of consciousness of unspecified duration, subsequent encounter: Secondary | ICD-10-CM | POA: Diagnosis not present

## 2020-09-29 ENCOUNTER — Ambulatory Visit: Payer: Medicare HMO | Admitting: Adult Health

## 2020-11-05 DIAGNOSIS — S065X9A Traumatic subdural hemorrhage with loss of consciousness of unspecified duration, initial encounter: Secondary | ICD-10-CM | POA: Diagnosis not present

## 2020-11-05 DIAGNOSIS — R270 Ataxia, unspecified: Secondary | ICD-10-CM | POA: Diagnosis not present

## 2020-11-05 DIAGNOSIS — M6281 Muscle weakness (generalized): Secondary | ICD-10-CM | POA: Diagnosis not present

## 2020-11-05 DIAGNOSIS — E46 Unspecified protein-calorie malnutrition: Secondary | ICD-10-CM | POA: Diagnosis not present

## 2020-11-09 DIAGNOSIS — F0280 Dementia in other diseases classified elsewhere without behavioral disturbance: Secondary | ICD-10-CM | POA: Diagnosis not present

## 2020-11-09 DIAGNOSIS — G4089 Other seizures: Secondary | ICD-10-CM | POA: Diagnosis not present

## 2020-11-09 DIAGNOSIS — F0150 Vascular dementia without behavioral disturbance: Secondary | ICD-10-CM | POA: Diagnosis not present

## 2020-11-09 DIAGNOSIS — G309 Alzheimer's disease, unspecified: Secondary | ICD-10-CM | POA: Diagnosis not present

## 2020-11-13 DIAGNOSIS — H1132 Conjunctival hemorrhage, left eye: Secondary | ICD-10-CM | POA: Diagnosis not present

## 2020-11-24 DIAGNOSIS — M2011 Hallux valgus (acquired), right foot: Secondary | ICD-10-CM | POA: Diagnosis not present

## 2020-11-24 DIAGNOSIS — M2012 Hallux valgus (acquired), left foot: Secondary | ICD-10-CM | POA: Diagnosis not present

## 2020-11-24 DIAGNOSIS — B351 Tinea unguium: Secondary | ICD-10-CM | POA: Diagnosis not present

## 2020-11-24 DIAGNOSIS — I739 Peripheral vascular disease, unspecified: Secondary | ICD-10-CM | POA: Diagnosis not present

## 2020-11-24 DIAGNOSIS — L603 Nail dystrophy: Secondary | ICD-10-CM | POA: Diagnosis not present

## 2020-12-05 DIAGNOSIS — N390 Urinary tract infection, site not specified: Secondary | ICD-10-CM | POA: Diagnosis not present

## 2020-12-06 DIAGNOSIS — R300 Dysuria: Secondary | ICD-10-CM | POA: Diagnosis not present

## 2020-12-09 DIAGNOSIS — D649 Anemia, unspecified: Secondary | ICD-10-CM | POA: Diagnosis not present

## 2020-12-09 DIAGNOSIS — I10 Essential (primary) hypertension: Secondary | ICD-10-CM | POA: Diagnosis not present

## 2020-12-09 DIAGNOSIS — G40909 Epilepsy, unspecified, not intractable, without status epilepticus: Secondary | ICD-10-CM | POA: Diagnosis not present

## 2020-12-09 DIAGNOSIS — F0150 Vascular dementia without behavioral disturbance: Secondary | ICD-10-CM | POA: Diagnosis not present

## 2020-12-11 DIAGNOSIS — M109 Gout, unspecified: Secondary | ICD-10-CM | POA: Diagnosis not present

## 2020-12-11 DIAGNOSIS — D649 Anemia, unspecified: Secondary | ICD-10-CM | POA: Diagnosis not present

## 2020-12-11 DIAGNOSIS — G4089 Other seizures: Secondary | ICD-10-CM | POA: Diagnosis not present

## 2020-12-11 DIAGNOSIS — Z5181 Encounter for therapeutic drug level monitoring: Secondary | ICD-10-CM | POA: Diagnosis not present

## 2020-12-14 DIAGNOSIS — M109 Gout, unspecified: Secondary | ICD-10-CM | POA: Diagnosis not present

## 2020-12-14 DIAGNOSIS — G40909 Epilepsy, unspecified, not intractable, without status epilepticus: Secondary | ICD-10-CM | POA: Diagnosis not present

## 2020-12-14 DIAGNOSIS — I10 Essential (primary) hypertension: Secondary | ICD-10-CM | POA: Diagnosis not present

## 2020-12-25 DIAGNOSIS — G4089 Other seizures: Secondary | ICD-10-CM | POA: Diagnosis not present

## 2020-12-28 DIAGNOSIS — G4089 Other seizures: Secondary | ICD-10-CM | POA: Diagnosis not present

## 2020-12-28 DIAGNOSIS — R059 Cough, unspecified: Secondary | ICD-10-CM | POA: Diagnosis not present

## 2020-12-31 DIAGNOSIS — G4089 Other seizures: Secondary | ICD-10-CM | POA: Diagnosis not present

## 2021-01-01 DIAGNOSIS — G40909 Epilepsy, unspecified, not intractable, without status epilepticus: Secondary | ICD-10-CM | POA: Diagnosis not present

## 2021-01-05 DIAGNOSIS — D649 Anemia, unspecified: Secondary | ICD-10-CM | POA: Diagnosis not present

## 2021-01-13 DIAGNOSIS — S065X9D Traumatic subdural hemorrhage with loss of consciousness of unspecified duration, subsequent encounter: Secondary | ICD-10-CM | POA: Diagnosis not present

## 2021-01-13 DIAGNOSIS — M6281 Muscle weakness (generalized): Secondary | ICD-10-CM | POA: Diagnosis not present

## 2021-01-13 DIAGNOSIS — Z741 Need for assistance with personal care: Secondary | ICD-10-CM | POA: Diagnosis not present

## 2021-01-14 DIAGNOSIS — S065X9D Traumatic subdural hemorrhage with loss of consciousness of unspecified duration, subsequent encounter: Secondary | ICD-10-CM | POA: Diagnosis not present

## 2021-01-14 DIAGNOSIS — M6281 Muscle weakness (generalized): Secondary | ICD-10-CM | POA: Diagnosis not present

## 2021-01-14 DIAGNOSIS — Z741 Need for assistance with personal care: Secondary | ICD-10-CM | POA: Diagnosis not present

## 2021-01-16 DIAGNOSIS — S065X9D Traumatic subdural hemorrhage with loss of consciousness of unspecified duration, subsequent encounter: Secondary | ICD-10-CM | POA: Diagnosis not present

## 2021-01-16 DIAGNOSIS — M6281 Muscle weakness (generalized): Secondary | ICD-10-CM | POA: Diagnosis not present

## 2021-01-16 DIAGNOSIS — Z741 Need for assistance with personal care: Secondary | ICD-10-CM | POA: Diagnosis not present

## 2021-01-20 DIAGNOSIS — S065X9D Traumatic subdural hemorrhage with loss of consciousness of unspecified duration, subsequent encounter: Secondary | ICD-10-CM | POA: Diagnosis not present

## 2021-01-20 DIAGNOSIS — M6281 Muscle weakness (generalized): Secondary | ICD-10-CM | POA: Diagnosis not present

## 2021-01-20 DIAGNOSIS — Z741 Need for assistance with personal care: Secondary | ICD-10-CM | POA: Diagnosis not present

## 2021-01-22 DIAGNOSIS — G4089 Other seizures: Secondary | ICD-10-CM | POA: Diagnosis not present

## 2021-01-26 DIAGNOSIS — G4089 Other seizures: Secondary | ICD-10-CM | POA: Diagnosis not present

## 2021-01-28 DIAGNOSIS — S065X9D Traumatic subdural hemorrhage with loss of consciousness of unspecified duration, subsequent encounter: Secondary | ICD-10-CM | POA: Diagnosis not present

## 2021-01-28 DIAGNOSIS — M6281 Muscle weakness (generalized): Secondary | ICD-10-CM | POA: Diagnosis not present

## 2021-01-28 DIAGNOSIS — Z741 Need for assistance with personal care: Secondary | ICD-10-CM | POA: Diagnosis not present

## 2021-01-30 DIAGNOSIS — I10 Essential (primary) hypertension: Secondary | ICD-10-CM | POA: Diagnosis not present

## 2021-02-01 DIAGNOSIS — R2681 Unsteadiness on feet: Secondary | ICD-10-CM | POA: Diagnosis not present

## 2021-02-01 DIAGNOSIS — M6281 Muscle weakness (generalized): Secondary | ICD-10-CM | POA: Diagnosis not present

## 2021-02-01 DIAGNOSIS — S065X9D Traumatic subdural hemorrhage with loss of consciousness of unspecified duration, subsequent encounter: Secondary | ICD-10-CM | POA: Diagnosis not present

## 2021-02-02 DIAGNOSIS — S065X9D Traumatic subdural hemorrhage with loss of consciousness of unspecified duration, subsequent encounter: Secondary | ICD-10-CM | POA: Diagnosis not present

## 2021-02-02 DIAGNOSIS — M6281 Muscle weakness (generalized): Secondary | ICD-10-CM | POA: Diagnosis not present

## 2021-02-02 DIAGNOSIS — R2681 Unsteadiness on feet: Secondary | ICD-10-CM | POA: Diagnosis not present

## 2021-02-03 DIAGNOSIS — R2681 Unsteadiness on feet: Secondary | ICD-10-CM | POA: Diagnosis not present

## 2021-02-03 DIAGNOSIS — M6281 Muscle weakness (generalized): Secondary | ICD-10-CM | POA: Diagnosis not present

## 2021-02-03 DIAGNOSIS — S065X9D Traumatic subdural hemorrhage with loss of consciousness of unspecified duration, subsequent encounter: Secondary | ICD-10-CM | POA: Diagnosis not present

## 2021-02-04 DIAGNOSIS — R300 Dysuria: Secondary | ICD-10-CM | POA: Diagnosis not present

## 2021-02-04 DIAGNOSIS — G40909 Epilepsy, unspecified, not intractable, without status epilepticus: Secondary | ICD-10-CM | POA: Diagnosis not present

## 2021-02-05 DIAGNOSIS — S065X9D Traumatic subdural hemorrhage with loss of consciousness of unspecified duration, subsequent encounter: Secondary | ICD-10-CM | POA: Diagnosis not present

## 2021-02-05 DIAGNOSIS — M6281 Muscle weakness (generalized): Secondary | ICD-10-CM | POA: Diagnosis not present

## 2021-02-05 DIAGNOSIS — R2681 Unsteadiness on feet: Secondary | ICD-10-CM | POA: Diagnosis not present

## 2021-02-07 DIAGNOSIS — R2681 Unsteadiness on feet: Secondary | ICD-10-CM | POA: Diagnosis not present

## 2021-02-07 DIAGNOSIS — S065X9D Traumatic subdural hemorrhage with loss of consciousness of unspecified duration, subsequent encounter: Secondary | ICD-10-CM | POA: Diagnosis not present

## 2021-02-07 DIAGNOSIS — M6281 Muscle weakness (generalized): Secondary | ICD-10-CM | POA: Diagnosis not present

## 2021-02-08 DIAGNOSIS — G4089 Other seizures: Secondary | ICD-10-CM | POA: Diagnosis not present

## 2021-02-08 DIAGNOSIS — R2681 Unsteadiness on feet: Secondary | ICD-10-CM | POA: Diagnosis not present

## 2021-02-08 DIAGNOSIS — D649 Anemia, unspecified: Secondary | ICD-10-CM | POA: Diagnosis not present

## 2021-02-08 DIAGNOSIS — M6281 Muscle weakness (generalized): Secondary | ICD-10-CM | POA: Diagnosis not present

## 2021-02-08 DIAGNOSIS — I10 Essential (primary) hypertension: Secondary | ICD-10-CM | POA: Diagnosis not present

## 2021-02-08 DIAGNOSIS — F0150 Vascular dementia without behavioral disturbance: Secondary | ICD-10-CM | POA: Diagnosis not present

## 2021-02-08 DIAGNOSIS — S065X9D Traumatic subdural hemorrhage with loss of consciousness of unspecified duration, subsequent encounter: Secondary | ICD-10-CM | POA: Diagnosis not present

## 2021-02-10 ENCOUNTER — Ambulatory Visit (INDEPENDENT_AMBULATORY_CARE_PROVIDER_SITE_OTHER): Payer: Medicare HMO | Admitting: Adult Health

## 2021-02-10 ENCOUNTER — Encounter: Payer: Self-pay | Admitting: Adult Health

## 2021-02-10 VITALS — BP 149/99 | HR 88 | Ht 61.0 in | Wt 87.0 lb

## 2021-02-10 DIAGNOSIS — G40909 Epilepsy, unspecified, not intractable, without status epilepticus: Secondary | ICD-10-CM

## 2021-02-10 NOTE — Patient Instructions (Signed)
Your Plan:  Continue Vimapt 200 mg twice day Continue Keppra 250 mg twice a day If your symptoms worsen or you develop new symptoms please let us know.   Thank you for coming to see [place] at [facility]. I hope we have been able to provide you high quality care today.  You may receive a patient satisfaction survey over the next few weeks. We would appreciate your feedback and comments so that we may continue to improve ourselves and the health of our patients.

## 2021-02-10 NOTE — Progress Notes (Signed)
PATIENT: [name] DOB: [birth year]  REASON FOR VISIT: follow up HISTORY FROM: patient  HISTORY OF PRESENT ILLNESS: Today 02/10/21:  [name] is a 81 year old female with a history of seizures.  She returns today for follow-up.  She is here today with her daughter and female friend.  She is currently on Keppra 250 mg twice a day.  This was recently reduced by her facility.  Unclear why it was reduced.  She remains on Vimpat 200 mg twice a day.  Her friend that is with her today states that she does not get up and move very much.  He feels that she needs another round of physical therapy.  Has asked that I make this recommendation to the facility.  Fortunately the patient has not had any seizure events.  She returns today for an evaluation.  08/04/20: [name] is a 81 year old female with a history of seizures.  She returns today for follow-up.  She had a seizure event on November 26 and was admitted to the hospital.  Her Keppra was increased to 1000 mg twice a day and then had increased to 200 mg twice a day.  To the family's knowledge she has not had any additional seizure events.  She reports that a week ago she was in [place] and call for her daughter.  According to the family members daughter states that she was shaking but she was talking to her.  Unclear if this was a true seizure event.  The patient reports that she is not missed any seizure medication.  However the family is confused about what medication she should be taking for her blood pressure.  Blood pressure is elevated today.  They report that they have reached out to her primary care for an appointment.  09/30/19: [name] is a 81 year old female with a history of seizures.  She returns today for follow-up.  She remains on Keppra and Vimpat.  She denies any seizure events.  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  Denies any changes in her mood or behavior.  She returns today for an  evaluation.  HISTORY 09/27/18:   [name] is a 81 year old female with a history of seizures.  She returns today for follow-up.  She remains on Keppra 500 mg daily and Vimpat 100 mg twice a day.  She reports that she has been tolerating these medications well.  She does not have any seizure events.  Her blood pressure is elevated today.  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  She returns today for evaluation.  REVIEW OF SYSTEMS: Out of a complete 14 system review of symptoms, the patient complains only of the following symptoms, and all other reviewed systems are negative.  See HPI  ALLERGIES: Allergies  Allergen Reactions   Banana     Raises K+   Budesonide-Formoterol Fumarate     Other reaction(s): Sore throat Other reaction(s): Sore throat   Fish Allergy    Lisinopril     Other reaction(s): nausea Other reaction(s): nausea   Mirtazapine     Other reaction(s): Severe Headaches Other reaction(s): Severe Headaches   Sodium Bicarbonate     Other reaction(s): Makes her vomit Other reaction(s): Makes her vomit    HOME MEDICATIONS: Outpatient Medications Prior to Visit  Medication Sig Dispense Refill   acetaminophen (TYLENOL) 325 MG tablet Take 650 mg by mouth every 6 (six) hours as needed.     allopurinol (  ZYLOPRIM) 100 MG tablet Take 1 tablet (100 mg total) by mouth daily. 30 tablet 0   amLODipine (NORVASC) 10 MG tablet Take 1 tablet (10 mg total) by mouth daily. 30 tablet 0   carvedilol (COREG) 12.5 MG tablet Take 1 tablet (12.5 mg total) by mouth 2 (two) times daily with a meal. 60 tablet 0   docusate sodium (COLACE) 100 MG capsule Take 1 capsule (100 mg total) by mouth 2 (two) times daily. 10 capsule 0   donepezil (ARICEPT) 5 MG tablet Take 1 tablet (5 mg total) by mouth daily. 30 tablet 0   Ensure (ENSURE) Take 237 mLs by mouth 3 (three) times daily between meals.     ferrous sulfate 325 (65 FE) MG tablet Take 325 mg by mouth daily.      lacosamide (VIMPAT) 200 MG TABS tablet Take 1 tablet (200 mg total) by mouth 2 (two) times daily. 60 tablet 0   levETIRAcetam (KEPPRA) 1000 MG tablet Take 1 tablet (1,000 mg total) by mouth 2 (two) times daily. (Patient taking differently: Take 250 mg by mouth 2 (two) times daily.) 60 tablet 0   losartan (COZAAR) 100 MG tablet Take 1 tablet (100 mg total) by mouth daily. 30 tablet 0   Multiple Vitamin (MULTIVITAMIN WITH MINERALS) TABS tablet Take 1 tablet by mouth daily.     No facility-administered medications prior to visit.    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   Basal ganglia infarction ([place]) 01/08/2013   CAP (community acquired pneumonia)    Chest pain 05/14/2015   Chronic combined systolic and diastolic CHF (congestive heart failure) ([place]) 03/06/2014   Congestive heart failure (CHF) ([place])    Constipation 02/24/2013   Cough    Diastolic dysfunction-grade 3 05/20/2015   Dyspnea 02/24/2013   Encephalopathy 12/28/2012   Epilepsy ([place])    Generalized seizure ([place]) 12/28/2012   H/O: CVA (cerebrovascular accident) 01/04/2013   Hyperammonemia ([place]) 05/17/2015   Hyperglycemia 02/24/2013   Hyperkalemia 02/11/2013   Hypertension    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   Hyponatremia 02/11/2013   Influenza A 02/11/2013   Liver enzyme elevation [id number]   Metabolic acidosis [id number]   Metabolic encephalopathy [id number]   NICM (nonischemic cardiomyopathy) -EF 25% 05/20/2015   Pleuritic chest pain 02/24/2013   PNA (pneumonia) 05/20/2015   Pneumobilia 02/12/2013   Pneumonia    Seizure ([place]) 12/28/2012    Seizures ([place])    Smoker    Stroke ([place])    Troponin level elevated 05/14/2015   Volume overload 02/24/2013   Weight gain 02/24/2013    PAST SURGICAL HISTORY: Past Surgical History:  Procedure Laterality Date   FEMUR IM NAIL Right 01/05/2020   INTRAMEDULLARY (IM) NAIL INTERTROCHANTERIC Right 01/05/2020   Procedure: INTRAMEDULLARY (IM) NAIL INTERTROCHANTRIC;  Surgeon: Hiram Gash, MD;  Location: [facility];  Service: Orthopedics;  Laterality: Right;   NO PAST SURGERIES     TOTAL HIP ARTHROPLASTY Left 03/29/2020   Procedure: TOTAL HIP ARTHROPLASTY ANTERIOR APPROACH;  Surgeon: Rod Can, MD;  Location[facility];  Service: Orthopedics;  Laterality: Left;    FAMILY HISTORY: Family History  Problem Relation  Age of Onset   Cancer Father    Anemia Daughter    Seizures Neg Hx     SOCIAL HISTORY: Social History   Socioeconomic History   Marital status: Significant Other    Spouse name: Rogelio Seen   Number of children: 3   Years of education: 13   Highest education level: Not on file  Occupational History    Comment: Retired  Tobacco Use   Smoking status: Some Days    Packs/day: 0.25    Types: Cigarettes   Smokeless tobacco: Never   Tobacco comments:    In three weeks she might have smoked 2 cigarettes (per significant other)  Vaping Use   Vaping Use: Never used  Substance and Sexual Activity   Alcohol use: Not Currently    Comment: occasional   Drug use: No   Sexual activity: Not Currently  Other Topics Concern   Not on file  Social History Narrative   Patient lives at home alone. (Update 08/04/2020 lives at [facility])   Retired.   Education one year of college.   Left handed.   Caffeine: maybe 3 cups a week   Social Determinants of Health   Financial Resource Strain: Not on file  Food Insecurity: Not on file  Transportation Needs: Not on file  Physical Activity: Not on file  Stress: Not on file  Social Connections: Not on file  Intimate  Partner Violence: Not on file      PHYSICAL EXAM  Vitals:   02/10/21 1152  BP: (!) 149/99  Pulse: 88  Weight: 87 lb (39.5 kg)  Height: 5\' 1"  (1.549 m)   Body mass index is 16.44 kg/m.  Generalized:I n no acute distress   Neurological examination  Mentation: Alert oriented to time, place, history taking. Follows all commands speech and language fluent Cranial nerve II-XII: Pupils were equal round reactive to light. Extraocular movements were full, visual field were full on confrontational test. Uvula tongue midline. Head turning and shoulder shrug  were normal and symmetric. Motor: The motor testing reveals 5 over 5 strength of all 4 extremities. Good symmetric motor tone is noted throughout.  Sensory: Sensory testing is intact to soft touch on all 4 extremities. No evidence of extinction is noted.  Coordination: Cerebellar testing reveals good finger-nose-finger and heel-to-shin bilaterally.  Gait and station: Patient is in a wheelchair.  Does not ambulate without two-person assist Reflexes: Deep tendon reflexes are symmetric and normal bilaterally.   DIAGNOSTIC DATA (LABS, IMAGING, TESTING) - I reviewed patient records, labs, notes, testing and imaging myself where available.  Lab Results  Component Value Date   WBC 5.5 07/29/2020   HGB 9.8 (L) 07/29/2020   HCT 29.7 (L) 07/29/2020   MCV 100.0 07/29/2020   PLT 254 07/29/2020      Component Value Date/Time   NA 136 07/29/2020 0846   NA 135 (A) 05/23/2015 0000   K 4.6 07/29/2020 0846   CL 104 07/29/2020 0846   CO2 23 07/29/2020 0846   GLUCOSE 96 07/29/2020 0846   BUN 43 (H) 07/29/2020 0846   BUN 40 (A) 05/23/2015 0000   CREATININE 1.45 (H) 07/29/2020 0846   CREATININE 1.42 (H) 06/04/2015 1515   CALCIUM 9.9 07/29/2020 0846   PROT 7.9 07/21/2020 1935   ALBUMIN 3.9 07/21/2020 1935   AST 17 07/21/2020 1935   ALT 14 07/21/2020 1935   ALKPHOS 68 07/21/2020 1935   BILITOT 0.4 07/21/2020 1935   GFRNONAA 37 (L)  07/29/2020  [place] (L) 07/23/2018 1657   Lab Results  Component Value Date   CHOL 135 01/07/2020   HDL 41 01/07/2020   LDLCALC 45 01/07/2020   TRIG 247 (H) 01/07/2020   CHOLHDL 3.3 01/07/2020   Lab Results  Component Value Date   HGBA1C 5.4 12/28/2019   Lab Results  Component Value Date   [id number] 03/28/2020   Lab Results  Component Value Date   TSH 1.765 05/16/2015      ASSESSMENT AND PLAN 81 y.o. year old female  has a past medical history of Abdominal pain (02/24/2013), Acute combined systolic and diastolic congestive heart failure (HCC) (02/24/2013), Acute on chronic renal insufficiency (05/15/2015), Acute renal failure ([place]) (12/28/2012), Acute renal failure superimposed on stage 3 chronic kidney disease ([place]), Anemia (05/20/2015), Ataxia, late effect of cerebrovascular disease (02/19/2013), Basal ganglia infarction ([place]) (01/08/2013), CAP (community acquired pneumonia), Chest pain (05/14/2015), Chronic combined systolic and diastolic CHF (congestive heart failure) ([place]) (03/06/2014), Congestive heart failure (CHF) ([place]), Constipation ([id number]), Cough, Diastolic dysfunction-grade 3 (05/20/2015), Dyspnea (02/24/2013), Encephalopathy (12/28/2012), Epilepsy ([place]), Generalized seizure ([place]) (12/28/2012), H/O: CVA (cerebrovascular accident) (01/04/2013), Hyperammonemia ([place]) (05/17/2015), Hyperglycemia (02/24/2013), Hyperkalemia (02/11/2013), Hypertension, Hypertensive heart and kidney disease (12/28/2012), Hyponatremia (02/11/2013), Influenza A (02/11/2013), Liver enzyme elevation ([id number]), Metabolic acidosis (Q000111Q), Metabolic encephalopathy ([id number]), NICM (nonischemic cardiomyopathy) -EF 25% (05/20/2015), Pleuritic chest pain (02/24/2013), PNA (pneumonia) (05/20/2015), Pneumobilia (02/12/2013), Pneumonia, Seizure ([place]) (12/28/2012), Seizures ([place]), Smoker, Stroke ([place]), Troponin level elevated (05/14/2015), Volume overload (02/24/2013), and Weight gain (02/24/2013). here  with:  Seizures  Continue Vimpat 200 mg twice a day  Keppra 250 mg BID- lowered by MD at her facility  Advised if she has any seizure event she should let us know Recommended physical therapy for the patient Follow-up in 6 months or sooner if needed   Ward Givens, MSN, NP-C 02/10/2021, 11:57 AM [facility] [address] [place], [place] [address] [phone]

## 2021-02-15 DIAGNOSIS — M6281 Muscle weakness (generalized): Secondary | ICD-10-CM | POA: Diagnosis not present

## 2021-02-15 DIAGNOSIS — R2681 Unsteadiness on feet: Secondary | ICD-10-CM | POA: Diagnosis not present

## 2021-02-15 DIAGNOSIS — S065X9D Traumatic subdural hemorrhage with loss of consciousness of unspecified duration, subsequent encounter: Secondary | ICD-10-CM | POA: Diagnosis not present

## 2021-02-16 DIAGNOSIS — M6281 Muscle weakness (generalized): Secondary | ICD-10-CM | POA: Diagnosis not present

## 2021-02-16 DIAGNOSIS — R2681 Unsteadiness on feet: Secondary | ICD-10-CM | POA: Diagnosis not present

## 2021-02-16 DIAGNOSIS — S065X9D Traumatic subdural hemorrhage with loss of consciousness of unspecified duration, subsequent encounter: Secondary | ICD-10-CM | POA: Diagnosis not present

## 2021-02-18 DIAGNOSIS — M6281 Muscle weakness (generalized): Secondary | ICD-10-CM | POA: Diagnosis not present

## 2021-02-18 DIAGNOSIS — S065X9D Traumatic subdural hemorrhage with loss of consciousness of unspecified duration, subsequent encounter: Secondary | ICD-10-CM | POA: Diagnosis not present

## 2021-02-18 DIAGNOSIS — R2681 Unsteadiness on feet: Secondary | ICD-10-CM | POA: Diagnosis not present

## 2021-02-19 DIAGNOSIS — R2681 Unsteadiness on feet: Secondary | ICD-10-CM | POA: Diagnosis not present

## 2021-02-19 DIAGNOSIS — M6281 Muscle weakness (generalized): Secondary | ICD-10-CM | POA: Diagnosis not present

## 2021-02-19 DIAGNOSIS — S065X9D Traumatic subdural hemorrhage with loss of consciousness of unspecified duration, subsequent encounter: Secondary | ICD-10-CM | POA: Diagnosis not present

## 2021-02-22 DIAGNOSIS — G40909 Epilepsy, unspecified, not intractable, without status epilepticus: Secondary | ICD-10-CM | POA: Diagnosis not present

## 2021-02-22 DIAGNOSIS — R2681 Unsteadiness on feet: Secondary | ICD-10-CM | POA: Diagnosis not present

## 2021-02-22 DIAGNOSIS — S065X9D Traumatic subdural hemorrhage with loss of consciousness of unspecified duration, subsequent encounter: Secondary | ICD-10-CM | POA: Diagnosis not present

## 2021-02-22 DIAGNOSIS — M6281 Muscle weakness (generalized): Secondary | ICD-10-CM | POA: Diagnosis not present

## 2021-02-23 DIAGNOSIS — R2681 Unsteadiness on feet: Secondary | ICD-10-CM | POA: Diagnosis not present

## 2021-02-23 DIAGNOSIS — M6281 Muscle weakness (generalized): Secondary | ICD-10-CM | POA: Diagnosis not present

## 2021-02-23 DIAGNOSIS — S065X9D Traumatic subdural hemorrhage with loss of consciousness of unspecified duration, subsequent encounter: Secondary | ICD-10-CM | POA: Diagnosis not present

## 2021-02-24 DIAGNOSIS — M6281 Muscle weakness (generalized): Secondary | ICD-10-CM | POA: Diagnosis not present

## 2021-02-24 DIAGNOSIS — R2681 Unsteadiness on feet: Secondary | ICD-10-CM | POA: Diagnosis not present

## 2021-02-24 DIAGNOSIS — S065X9D Traumatic subdural hemorrhage with loss of consciousness of unspecified duration, subsequent encounter: Secondary | ICD-10-CM | POA: Diagnosis not present

## 2021-02-25 DIAGNOSIS — S065X9D Traumatic subdural hemorrhage with loss of consciousness of unspecified duration, subsequent encounter: Secondary | ICD-10-CM | POA: Diagnosis not present

## 2021-02-25 DIAGNOSIS — M6281 Muscle weakness (generalized): Secondary | ICD-10-CM | POA: Diagnosis not present

## 2021-02-25 DIAGNOSIS — R2681 Unsteadiness on feet: Secondary | ICD-10-CM | POA: Diagnosis not present

## 2021-02-26 DIAGNOSIS — M6281 Muscle weakness (generalized): Secondary | ICD-10-CM | POA: Diagnosis not present

## 2021-02-26 DIAGNOSIS — S065X9D Traumatic subdural hemorrhage with loss of consciousness of unspecified duration, subsequent encounter: Secondary | ICD-10-CM | POA: Diagnosis not present

## 2021-02-26 DIAGNOSIS — R2681 Unsteadiness on feet: Secondary | ICD-10-CM | POA: Diagnosis not present

## 2021-02-28 DIAGNOSIS — S065X9D Traumatic subdural hemorrhage with loss of consciousness of unspecified duration, subsequent encounter: Secondary | ICD-10-CM | POA: Diagnosis not present

## 2021-02-28 DIAGNOSIS — M6281 Muscle weakness (generalized): Secondary | ICD-10-CM | POA: Diagnosis not present

## 2021-02-28 DIAGNOSIS — R2681 Unsteadiness on feet: Secondary | ICD-10-CM | POA: Diagnosis not present

## 2021-03-02 DIAGNOSIS — R2681 Unsteadiness on feet: Secondary | ICD-10-CM | POA: Diagnosis not present

## 2021-03-02 DIAGNOSIS — S065X9D Traumatic subdural hemorrhage with loss of consciousness of unspecified duration, subsequent encounter: Secondary | ICD-10-CM | POA: Diagnosis not present

## 2021-03-02 DIAGNOSIS — M6281 Muscle weakness (generalized): Secondary | ICD-10-CM | POA: Diagnosis not present

## 2021-03-03 DIAGNOSIS — R2681 Unsteadiness on feet: Secondary | ICD-10-CM | POA: Diagnosis not present

## 2021-03-03 DIAGNOSIS — S065X9D Traumatic subdural hemorrhage with loss of consciousness of unspecified duration, subsequent encounter: Secondary | ICD-10-CM | POA: Diagnosis not present

## 2021-03-03 DIAGNOSIS — M6281 Muscle weakness (generalized): Secondary | ICD-10-CM | POA: Diagnosis not present

## 2021-03-03 DIAGNOSIS — I10 Essential (primary) hypertension: Secondary | ICD-10-CM | POA: Diagnosis not present

## 2021-03-03 DIAGNOSIS — D519 Vitamin B12 deficiency anemia, unspecified: Secondary | ICD-10-CM | POA: Diagnosis not present

## 2021-03-04 DIAGNOSIS — N189 Chronic kidney disease, unspecified: Secondary | ICD-10-CM | POA: Diagnosis not present

## 2021-03-04 DIAGNOSIS — D631 Anemia in chronic kidney disease: Secondary | ICD-10-CM | POA: Diagnosis not present

## 2021-03-04 DIAGNOSIS — R2681 Unsteadiness on feet: Secondary | ICD-10-CM | POA: Diagnosis not present

## 2021-03-04 DIAGNOSIS — S065X9D Traumatic subdural hemorrhage with loss of consciousness of unspecified duration, subsequent encounter: Secondary | ICD-10-CM | POA: Diagnosis not present

## 2021-03-04 DIAGNOSIS — N1831 Chronic kidney disease, stage 3a: Secondary | ICD-10-CM | POA: Diagnosis not present

## 2021-03-04 DIAGNOSIS — M6281 Muscle weakness (generalized): Secondary | ICD-10-CM | POA: Diagnosis not present

## 2021-03-04 DIAGNOSIS — I10 Essential (primary) hypertension: Secondary | ICD-10-CM | POA: Diagnosis not present

## 2021-03-05 DIAGNOSIS — M6281 Muscle weakness (generalized): Secondary | ICD-10-CM | POA: Diagnosis not present

## 2021-03-05 DIAGNOSIS — R2681 Unsteadiness on feet: Secondary | ICD-10-CM | POA: Diagnosis not present

## 2021-03-05 DIAGNOSIS — S065X9D Traumatic subdural hemorrhage with loss of consciousness of unspecified duration, subsequent encounter: Secondary | ICD-10-CM | POA: Diagnosis not present

## 2021-03-08 DIAGNOSIS — M6281 Muscle weakness (generalized): Secondary | ICD-10-CM | POA: Diagnosis not present

## 2021-03-08 DIAGNOSIS — R2681 Unsteadiness on feet: Secondary | ICD-10-CM | POA: Diagnosis not present

## 2021-03-08 DIAGNOSIS — S065X9D Traumatic subdural hemorrhage with loss of consciousness of unspecified duration, subsequent encounter: Secondary | ICD-10-CM | POA: Diagnosis not present

## 2021-03-10 DIAGNOSIS — M6281 Muscle weakness (generalized): Secondary | ICD-10-CM | POA: Diagnosis not present

## 2021-03-10 DIAGNOSIS — R2681 Unsteadiness on feet: Secondary | ICD-10-CM | POA: Diagnosis not present

## 2021-03-10 DIAGNOSIS — S065X9D Traumatic subdural hemorrhage with loss of consciousness of unspecified duration, subsequent encounter: Secondary | ICD-10-CM | POA: Diagnosis not present

## 2021-03-11 DIAGNOSIS — R2681 Unsteadiness on feet: Secondary | ICD-10-CM | POA: Diagnosis not present

## 2021-03-11 DIAGNOSIS — S065X9D Traumatic subdural hemorrhage with loss of consciousness of unspecified duration, subsequent encounter: Secondary | ICD-10-CM | POA: Diagnosis not present

## 2021-03-11 DIAGNOSIS — M6281 Muscle weakness (generalized): Secondary | ICD-10-CM | POA: Diagnosis not present

## 2021-03-12 DIAGNOSIS — M6281 Muscle weakness (generalized): Secondary | ICD-10-CM | POA: Diagnosis not present

## 2021-03-12 DIAGNOSIS — S065X9D Traumatic subdural hemorrhage with loss of consciousness of unspecified duration, subsequent encounter: Secondary | ICD-10-CM | POA: Diagnosis not present

## 2021-03-12 DIAGNOSIS — R2681 Unsteadiness on feet: Secondary | ICD-10-CM | POA: Diagnosis not present

## 2021-03-13 DIAGNOSIS — S065X9D Traumatic subdural hemorrhage with loss of consciousness of unspecified duration, subsequent encounter: Secondary | ICD-10-CM | POA: Diagnosis not present

## 2021-03-13 DIAGNOSIS — M6281 Muscle weakness (generalized): Secondary | ICD-10-CM | POA: Diagnosis not present

## 2021-03-13 DIAGNOSIS — R2681 Unsteadiness on feet: Secondary | ICD-10-CM | POA: Diagnosis not present

## 2021-03-17 DIAGNOSIS — M109 Gout, unspecified: Secondary | ICD-10-CM | POA: Diagnosis not present

## 2021-03-17 DIAGNOSIS — G40909 Epilepsy, unspecified, not intractable, without status epilepticus: Secondary | ICD-10-CM | POA: Diagnosis not present

## 2021-03-17 DIAGNOSIS — K5900 Constipation, unspecified: Secondary | ICD-10-CM | POA: Diagnosis not present

## 2021-03-17 DIAGNOSIS — I10 Essential (primary) hypertension: Secondary | ICD-10-CM | POA: Diagnosis not present

## 2021-03-30 DIAGNOSIS — B351 Tinea unguium: Secondary | ICD-10-CM | POA: Diagnosis not present

## 2021-03-30 DIAGNOSIS — I739 Peripheral vascular disease, unspecified: Secondary | ICD-10-CM | POA: Diagnosis not present

## 2021-03-30 DIAGNOSIS — L603 Nail dystrophy: Secondary | ICD-10-CM | POA: Diagnosis not present

## 2021-04-07 DIAGNOSIS — R0981 Nasal congestion: Secondary | ICD-10-CM | POA: Diagnosis not present

## 2021-04-12 DIAGNOSIS — I10 Essential (primary) hypertension: Secondary | ICD-10-CM | POA: Diagnosis not present

## 2021-04-12 DIAGNOSIS — W19XXXA Unspecified fall, initial encounter: Secondary | ICD-10-CM | POA: Diagnosis not present

## 2021-04-13 DIAGNOSIS — D649 Anemia, unspecified: Secondary | ICD-10-CM | POA: Diagnosis not present

## 2021-04-13 DIAGNOSIS — I5042 Chronic combined systolic (congestive) and diastolic (congestive) heart failure: Secondary | ICD-10-CM | POA: Diagnosis not present

## 2021-04-13 DIAGNOSIS — E46 Unspecified protein-calorie malnutrition: Secondary | ICD-10-CM | POA: Diagnosis not present

## 2021-04-13 DIAGNOSIS — G309 Alzheimer's disease, unspecified: Secondary | ICD-10-CM | POA: Diagnosis not present

## 2021-04-13 DIAGNOSIS — G4089 Other seizures: Secondary | ICD-10-CM | POA: Diagnosis not present

## 2021-04-13 DIAGNOSIS — M109 Gout, unspecified: Secondary | ICD-10-CM | POA: Diagnosis not present

## 2021-04-13 DIAGNOSIS — G40909 Epilepsy, unspecified, not intractable, without status epilepticus: Secondary | ICD-10-CM | POA: Diagnosis not present

## 2021-04-13 DIAGNOSIS — I10 Essential (primary) hypertension: Secondary | ICD-10-CM | POA: Diagnosis not present

## 2021-04-13 DIAGNOSIS — K5900 Constipation, unspecified: Secondary | ICD-10-CM | POA: Diagnosis not present

## 2021-04-19 DIAGNOSIS — H6122 Impacted cerumen, left ear: Secondary | ICD-10-CM | POA: Diagnosis not present

## 2021-05-04 DIAGNOSIS — G4089 Other seizures: Secondary | ICD-10-CM | POA: Diagnosis not present

## 2021-05-04 DIAGNOSIS — R251 Tremor, unspecified: Secondary | ICD-10-CM | POA: Diagnosis not present

## 2021-05-05 DIAGNOSIS — G4089 Other seizures: Secondary | ICD-10-CM | POA: Diagnosis not present

## 2021-05-06 DIAGNOSIS — R253 Fasciculation: Secondary | ICD-10-CM | POA: Diagnosis not present

## 2021-05-06 DIAGNOSIS — E119 Type 2 diabetes mellitus without complications: Secondary | ICD-10-CM | POA: Diagnosis not present

## 2021-05-10 DIAGNOSIS — G40909 Epilepsy, unspecified, not intractable, without status epilepticus: Secondary | ICD-10-CM | POA: Diagnosis not present

## 2021-05-10 DIAGNOSIS — R251 Tremor, unspecified: Secondary | ICD-10-CM | POA: Diagnosis not present

## 2021-05-11 DIAGNOSIS — F0150 Vascular dementia without behavioral disturbance: Secondary | ICD-10-CM | POA: Diagnosis not present

## 2021-05-11 DIAGNOSIS — S065X9D Traumatic subdural hemorrhage with loss of consciousness of unspecified duration, subsequent encounter: Secondary | ICD-10-CM | POA: Diagnosis not present

## 2021-05-11 DIAGNOSIS — R41841 Cognitive communication deficit: Secondary | ICD-10-CM | POA: Diagnosis not present

## 2021-05-12 DIAGNOSIS — F0150 Vascular dementia without behavioral disturbance: Secondary | ICD-10-CM | POA: Diagnosis not present

## 2021-05-12 DIAGNOSIS — R41841 Cognitive communication deficit: Secondary | ICD-10-CM | POA: Diagnosis not present

## 2021-05-12 DIAGNOSIS — S065X9D Traumatic subdural hemorrhage with loss of consciousness of unspecified duration, subsequent encounter: Secondary | ICD-10-CM | POA: Diagnosis not present

## 2021-05-13 DIAGNOSIS — M109 Gout, unspecified: Secondary | ICD-10-CM | POA: Diagnosis not present

## 2021-05-13 DIAGNOSIS — K5900 Constipation, unspecified: Secondary | ICD-10-CM | POA: Diagnosis not present

## 2021-05-13 DIAGNOSIS — I10 Essential (primary) hypertension: Secondary | ICD-10-CM | POA: Diagnosis not present

## 2021-05-13 DIAGNOSIS — G4089 Other seizures: Secondary | ICD-10-CM | POA: Diagnosis not present

## 2021-05-13 DIAGNOSIS — F0150 Vascular dementia without behavioral disturbance: Secondary | ICD-10-CM | POA: Diagnosis not present

## 2021-05-13 DIAGNOSIS — I5042 Chronic combined systolic (congestive) and diastolic (congestive) heart failure: Secondary | ICD-10-CM | POA: Diagnosis not present

## 2021-05-13 DIAGNOSIS — G309 Alzheimer's disease, unspecified: Secondary | ICD-10-CM | POA: Diagnosis not present

## 2021-05-13 DIAGNOSIS — N189 Chronic kidney disease, unspecified: Secondary | ICD-10-CM | POA: Diagnosis not present

## 2021-05-13 DIAGNOSIS — R41841 Cognitive communication deficit: Secondary | ICD-10-CM | POA: Diagnosis not present

## 2021-05-13 DIAGNOSIS — S065X9D Traumatic subdural hemorrhage with loss of consciousness of unspecified duration, subsequent encounter: Secondary | ICD-10-CM | POA: Diagnosis not present

## 2021-05-14 DIAGNOSIS — R41841 Cognitive communication deficit: Secondary | ICD-10-CM | POA: Diagnosis not present

## 2021-05-14 DIAGNOSIS — S065X9D Traumatic subdural hemorrhage with loss of consciousness of unspecified duration, subsequent encounter: Secondary | ICD-10-CM | POA: Diagnosis not present

## 2021-05-14 DIAGNOSIS — F0150 Vascular dementia without behavioral disturbance: Secondary | ICD-10-CM | POA: Diagnosis not present

## 2021-05-14 DIAGNOSIS — I10 Essential (primary) hypertension: Secondary | ICD-10-CM | POA: Diagnosis not present

## 2021-05-14 DIAGNOSIS — H109 Unspecified conjunctivitis: Secondary | ICD-10-CM | POA: Diagnosis not present

## 2021-05-16 DIAGNOSIS — R41841 Cognitive communication deficit: Secondary | ICD-10-CM | POA: Diagnosis not present

## 2021-05-16 DIAGNOSIS — F0150 Vascular dementia without behavioral disturbance: Secondary | ICD-10-CM | POA: Diagnosis not present

## 2021-05-16 DIAGNOSIS — S065X9D Traumatic subdural hemorrhage with loss of consciousness of unspecified duration, subsequent encounter: Secondary | ICD-10-CM | POA: Diagnosis not present

## 2021-05-17 DIAGNOSIS — F0150 Vascular dementia without behavioral disturbance: Secondary | ICD-10-CM | POA: Diagnosis not present

## 2021-05-17 DIAGNOSIS — R41841 Cognitive communication deficit: Secondary | ICD-10-CM | POA: Diagnosis not present

## 2021-05-17 DIAGNOSIS — S065X9D Traumatic subdural hemorrhage with loss of consciousness of unspecified duration, subsequent encounter: Secondary | ICD-10-CM | POA: Diagnosis not present

## 2021-05-18 DIAGNOSIS — F0150 Vascular dementia without behavioral disturbance: Secondary | ICD-10-CM | POA: Diagnosis not present

## 2021-05-18 DIAGNOSIS — S065X9D Traumatic subdural hemorrhage with loss of consciousness of unspecified duration, subsequent encounter: Secondary | ICD-10-CM | POA: Diagnosis not present

## 2021-05-18 DIAGNOSIS — R41841 Cognitive communication deficit: Secondary | ICD-10-CM | POA: Diagnosis not present

## 2021-05-19 DIAGNOSIS — S065X9D Traumatic subdural hemorrhage with loss of consciousness of unspecified duration, subsequent encounter: Secondary | ICD-10-CM | POA: Diagnosis not present

## 2021-05-19 DIAGNOSIS — F0150 Vascular dementia without behavioral disturbance: Secondary | ICD-10-CM | POA: Diagnosis not present

## 2021-05-19 DIAGNOSIS — R41841 Cognitive communication deficit: Secondary | ICD-10-CM | POA: Diagnosis not present

## 2021-05-20 DIAGNOSIS — S065X9D Traumatic subdural hemorrhage with loss of consciousness of unspecified duration, subsequent encounter: Secondary | ICD-10-CM | POA: Diagnosis not present

## 2021-05-20 DIAGNOSIS — R41841 Cognitive communication deficit: Secondary | ICD-10-CM | POA: Diagnosis not present

## 2021-05-20 DIAGNOSIS — F0150 Vascular dementia without behavioral disturbance: Secondary | ICD-10-CM | POA: Diagnosis not present

## 2021-05-25 DIAGNOSIS — S065X9D Traumatic subdural hemorrhage with loss of consciousness of unspecified duration, subsequent encounter: Secondary | ICD-10-CM | POA: Diagnosis not present

## 2021-05-25 DIAGNOSIS — F0150 Vascular dementia without behavioral disturbance: Secondary | ICD-10-CM | POA: Diagnosis not present

## 2021-05-25 DIAGNOSIS — R41841 Cognitive communication deficit: Secondary | ICD-10-CM | POA: Diagnosis not present

## 2021-05-26 DIAGNOSIS — F0150 Vascular dementia without behavioral disturbance: Secondary | ICD-10-CM | POA: Diagnosis not present

## 2021-05-26 DIAGNOSIS — S065X9D Traumatic subdural hemorrhage with loss of consciousness of unspecified duration, subsequent encounter: Secondary | ICD-10-CM | POA: Diagnosis not present

## 2021-05-26 DIAGNOSIS — R41841 Cognitive communication deficit: Secondary | ICD-10-CM | POA: Diagnosis not present

## 2021-05-27 DIAGNOSIS — R41841 Cognitive communication deficit: Secondary | ICD-10-CM | POA: Diagnosis not present

## 2021-05-27 DIAGNOSIS — S065X9D Traumatic subdural hemorrhage with loss of consciousness of unspecified duration, subsequent encounter: Secondary | ICD-10-CM | POA: Diagnosis not present

## 2021-05-27 DIAGNOSIS — F0150 Vascular dementia without behavioral disturbance: Secondary | ICD-10-CM | POA: Diagnosis not present

## 2021-05-28 DIAGNOSIS — R41841 Cognitive communication deficit: Secondary | ICD-10-CM | POA: Diagnosis not present

## 2021-05-28 DIAGNOSIS — F0150 Vascular dementia without behavioral disturbance: Secondary | ICD-10-CM | POA: Diagnosis not present

## 2021-05-28 DIAGNOSIS — S065X9D Traumatic subdural hemorrhage with loss of consciousness of unspecified duration, subsequent encounter: Secondary | ICD-10-CM | POA: Diagnosis not present

## 2021-05-29 DIAGNOSIS — F0150 Vascular dementia without behavioral disturbance: Secondary | ICD-10-CM | POA: Diagnosis not present

## 2021-05-29 DIAGNOSIS — R41841 Cognitive communication deficit: Secondary | ICD-10-CM | POA: Diagnosis not present

## 2021-05-29 DIAGNOSIS — S065X9D Traumatic subdural hemorrhage with loss of consciousness of unspecified duration, subsequent encounter: Secondary | ICD-10-CM | POA: Diagnosis not present

## 2021-06-01 ENCOUNTER — Encounter: Payer: Self-pay | Admitting: Adult Health

## 2021-06-01 ENCOUNTER — Ambulatory Visit (INDEPENDENT_AMBULATORY_CARE_PROVIDER_SITE_OTHER): Payer: Medicare Other | Admitting: Adult Health

## 2021-06-01 VITALS — BP 146/81 | HR 79 | Ht 62.0 in | Wt 95.4 lb

## 2021-06-01 DIAGNOSIS — G40909 Epilepsy, unspecified, not intractable, without status epilepticus: Secondary | ICD-10-CM | POA: Diagnosis not present

## 2021-06-01 NOTE — Progress Notes (Signed)
? ? ?PATIENT: [name] ?DOB: 02-12-1940 ? ?REASON FOR VISIT: follow up ?HISTORY FROM: patient ? ?Chief Complaint  ?Patient presents with  ? Follow-up  ?  Pt in 4 life partner and friend  pt is here  life partner states patient had tremors and muscle spasm. Life partner states he thinks the facility  is not giving patient seizure  medication correctly keppra has been decreased 250mg  x daily   ? ? ? ?HISTORY OF PRESENT ILLNESS: ?Today 06/01/21: ?[name] is a 81 year old female with a history of seizures. She returns today for follow-up. ?Life partner reports that she had tremors about 3 weeks ago. During the event she was talking coherantly. Reports that facility gave a muscle relaxer and that has helped. Denies any true seizure events as far as family knows. Continues to live at facility. Continues on Keppra 250 mg BID and Vimpat 200 mg BID.  ? ?02/10/21: [name] is a 81 year old female with a history of seizures.  She returns today for follow-up.  She is here today with her daughter and female friend.  She is currently on Keppra 250 mg twice a day.  This was recently reduced by her facility.  Unclear why it was reduced.  She remains on Vimpat 200 mg twice a day.  Her friend that is with her today states that she does not get up and move very much.  He feels that she needs another round of physical therapy.  Has asked that I make this recommendation to the facility.  Fortunately the patient has not had any seizure events.  She returns today for an evaluation. ? ?08/04/20: [name] is a 81 year old female with a history of seizures.  She returns today for follow-up.  She had a seizure event on November 26 and was admitted to the hospital.  Her Keppra was increased to 1000 mg twice a day and then had increased to 200 mg twice a day.  To the family's knowledge she has not had any additional seizure events.  She reports that a week ago she was in [place] and call for her daughter.  According to the  family members daughter states that she was shaking but she was talking to her.  Unclear if this was a true seizure event.  The patient reports that she is not missed any seizure medication.  However the family is confused about what medication she should be taking for her blood pressure.  Blood pressure is elevated today.  They report that they have reached out to her primary care for an appointment. ? ?09/30/19: [name] is a 81 year old female with a history of seizures.  She returns today for follow-up.  She remains on Keppra and Vimpat.  She denies any seizure events.  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  Denies any changes in her mood or behavior.  She returns today for an evaluation. ? ?HISTORY 09/27/18: ?  ?[name] is a 81 year old female with a history of seizures.  She returns today for follow-up.  She remains on Keppra 500 mg daily and Vimpat 100 mg twice a day.  She reports that she has been tolerating these medications well.  She does not have any seizure events.  Her blood pressure is elevated today.  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  She returns today for evaluation. ? ?REVIEW OF SYSTEMS: Out of a complete 14 system review of symptoms, the patient  complains only of the following symptoms, and all other reviewed systems are negative. ? ?See HPI ? ?ALLERGIES: ?Allergies  ?Allergen Reactions  ? Banana   ?  Raises K+  ? Budesonide-Formoterol Fumarate   ?  Other reaction(s): Sore throat ?Other reaction(s): Sore throat  ? Fish Allergy   ? Lisinopril   ?  Other reaction(s): nausea ?Other reaction(s): nausea  ? Mirtazapine   ?  Other reaction(s): Severe Headaches ?Other reaction(s): Severe Headaches  ? Sodium Bicarbonate   ?  Other reaction(s): Makes her vomit ?Other reaction(s): Makes her vomit  ? ? ?HOME MEDICATIONS: ?Outpatient Medications Prior to Visit  ?Medication Sig Dispense Refill  ? acetaminophen (TYLENOL) 325 MG tablet Take  650 mg by mouth every 6 (six) hours as needed.    ? allopurinol (ZYLOPRIM) 100 MG tablet Take 1 tablet (100 mg total) by mouth daily. 30 tablet 0  ? amLODipine (NORVASC) 10 MG tablet Take 1 tablet (10 mg total) by mouth daily. 30 tablet 0  ? carvedilol (COREG) 12.5 MG tablet Take 1 tablet (12.5 mg total) by mouth 2 (two) times daily with a meal. 60 tablet 0  ? docusate sodium (COLACE) 100 MG capsule Take 1 capsule (100 mg total) by mouth 2 (two) times daily. 10 capsule 0  ? donepezil (ARICEPT) 5 MG tablet Take 1 tablet (5 mg total) by mouth daily. 30 tablet 0  ? Ensure (ENSURE) Take 237 mLs by mouth 3 (three) times daily between meals.    ? ferrous sulfate 325 (65 FE) MG tablet Take 325 mg by mouth daily.    ? lacosamide (VIMPAT) 200 MG TABS tablet Take 1 tablet (200 mg total) by mouth 2 (two) times daily. 60 tablet 0  ? levETIRAcetam (KEPPRA) 1000 MG tablet Take 1 tablet (1,000 mg total) by mouth 2 (two) times daily. (Patient taking differently: Take 250 mg by mouth 2 (two) times daily.) 60 tablet 0  ? losartan (COZAAR) 100 MG tablet Take 1 tablet (100 mg total) by mouth daily. 30 tablet 0  ? Multiple Vitamin (MULTIVITAMIN WITH MINERALS) TABS tablet Take 1 tablet by mouth daily.    ? ?No facility-administered medications prior to visit.  ? ? ?PAST MEDICAL HISTORY: ?Past Medical History:  ?Diagnosis Date  ? Abdominal pain 02/24/2013  ? Acute combined systolic and diastolic congestive heart failure ([place]) 02/24/2013  ? Acute on chronic renal insufficiency 05/15/2015  ? Acute renal failure ([place]) 12/28/2012  ? Acute renal failure superimposed on stage 3 chronic kidney disease ([place])   ? Anemia 05/20/2015  ? 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 Basal ganglia infarction ([place]) 01/08/2013  ? CAP (community acquired pneumonia)   ? Chest pain 05/14/2015  ? Chronic combined systolic and diastolic CHF (congestive heart failure) ([place]) 03/06/2014  ? Congestive heart failure (CHF) ([place])   ? Constipation 02/24/2013  ?  Cough   ? Diastolic dysfunction-grade 3 05/20/2015  ? Dyspnea 02/24/2013  ? Encephalopathy 12/28/2012  ? Epilepsy ([place])   ? Generalized seizure ([place]) 12/28/2012  ? H/O: CVA (cerebrovascular accident) 01/04/2013  ? Hyperammonemia ([place]) 05/17/2015  ? Hyperglycemia 02/24/2013  ? Hyperkalemia 02/11/2013  ? Hypertension   ? 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 Hyponatremia 02/11/2013  ? Influenza A 02/11/2013  ? Liver enzyme elevation 02/11/2013  ? Metabolic acidosis Q000111Q  ? Metabolic encephalopathy [id number]  ? NICM (nonischemic cardiomyopathy) -EF 25% 05/20/2015  ? Pleuritic chest pain 02/24/2013  ? PNA (pneumonia) 05/20/2015  ? Pneumobilia 02/12/2013  ?  Pneumonia   ? Seizure ([place]) 12/28/2012  ? Seizures ([place])   ? Smoker   ? Stroke [facility])   ? Troponin level elevated 05/14/2015  ? Volume overload 02/24/2013  ? Weight gain 02/24/2013  ? ? ?PAST SURGICAL HISTORY: ?Past Surgical History:  ?Procedure Laterality Date  ? FEMUR IM NAIL Right 01/05/2020  ? INTRAMEDULLARY (IM) NAIL INTERTROCHANTERIC Right 01/05/2020  ? Procedure: INTRAMEDULLARY (IM) NAIL INTERTROCHANTRIC;  Surgeon: Hiram Gash, MD;  Location: [facility];  Service: Orthopedics;  Laterality: Right;  ? NO PAST SURGERIES    ? TOTAL HIP ARTHROPLASTY Left 03/29/2020  ? Procedure: TOTAL HIP ARTHROPLASTY ANTERIOR APPROACH;  Surgeon: Rod Can, MD;  [facility];  Service: Orthopedics;  Laterality: Left;  ? ? ?FAMILY HISTORY: ?Family History  ?Problem Relation Age of Onset  ? Cancer Father   ? Anemia Daughter   ? Seizures Neg Hx   ? ? ?SOCIAL HISTORY: ?Social History  ? ?Socioeconomic History  ? Marital status: Significant Other  ?  Spouse name: Rogelio Seen  ? Number of children: 3  ? Years of education: 88  ? Highest education level: Not on file  ?Occupational History  ?  Comment: Retired  ?Tobacco Use  ? Smoking status: Some Days  ?  Packs/day: 0.25  ?  Types: Cigarettes  ? Smokeless tobacco: Never  ? Tobacco comments:  ?  In three weeks she might have  smoked 2 cigarettes (per significant other)  ?Vaping Use  ? Vaping Use: Never used  ?Substance and Sexual Activity  ? Alcohol use: Not Currently  ?  Comment: occasional  ? Drug use: No  ? Sexual activity: N

## 2021-08-10 ENCOUNTER — Ambulatory Visit: Payer: Medicare HMO | Admitting: Adult Health

## 2021-11-07 IMAGING — CR DG PELVIS 1-2V
1 series · 1 of 1 positions shown · non-contrast
Comparison: CT 04/16/2012

CLINICAL DATA: Fall

EXAM:
PELVIS - 1-2 VIEW

[pelvis ap]
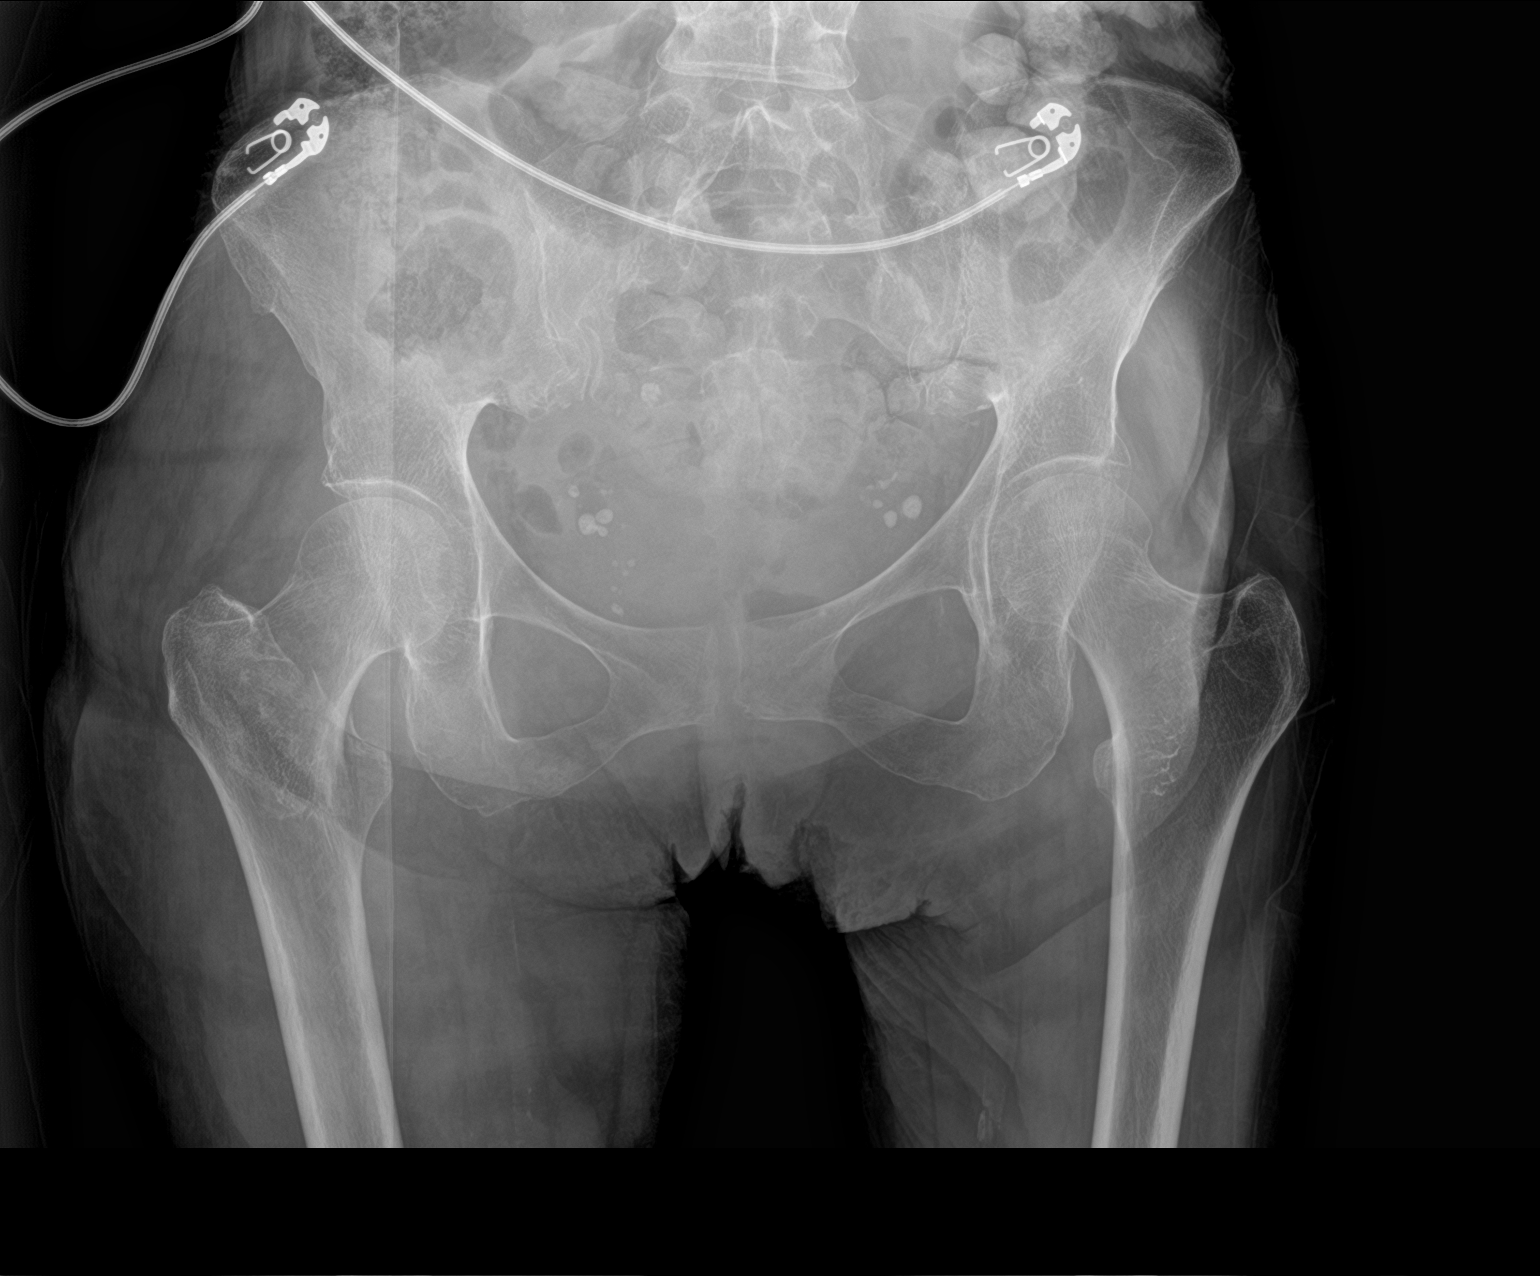

[1 of 1 positions shown; findings below may reference images not displayed]

FINDINGS: Comminuted, mildly displaced and impacted inter trochanteric femur
fracture. Additional suspicious lucency noted in the right femoral
head neck junction as well could reflect an additional fracture
line. Femoral head remains normally located. Left femur is intact.
Remaining bones of the pelvis are intact and congruent though
portions of the sacrum are obscured by overlying bowel gas.
Transitional lumbosacral vertebrae noted incidentally. Right lateral
hip swelling, possible hematoma and probable hip effusion.
IMPRESSION: 1. Comminuted, mildly displaced and impacted intertrochanteric right
femur fracture.
2. Additional suspicious lucency in the right femoral head neck
junction could reflect an additional subcapital right femoral
fracture line.
3. Right lateral hip swelling, possible hematoma and probable hip
effusion.

## 2021-11-07 IMAGING — CR DG FEMUR 2+V*R*
4 series · 4 of 4 positions shown · non-contrast
Comparison: None.

CLINICAL DATA: Ground level fall.  Right knee/leg pain.

EXAM:
RIGHT FEMUR 2 VIEWS

[femur lat (1 of 2)]
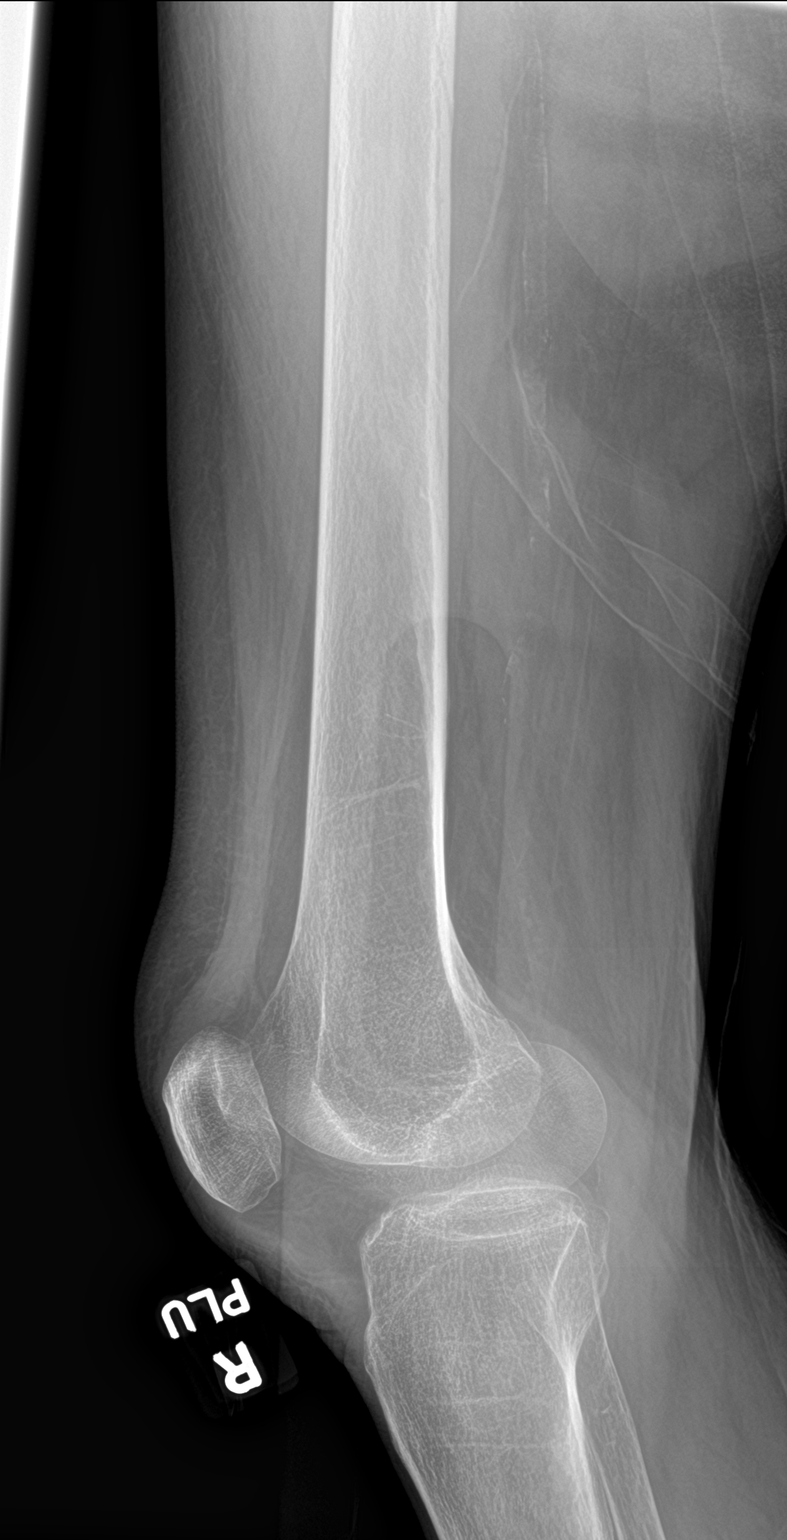

[femur ap (1 of 2)]
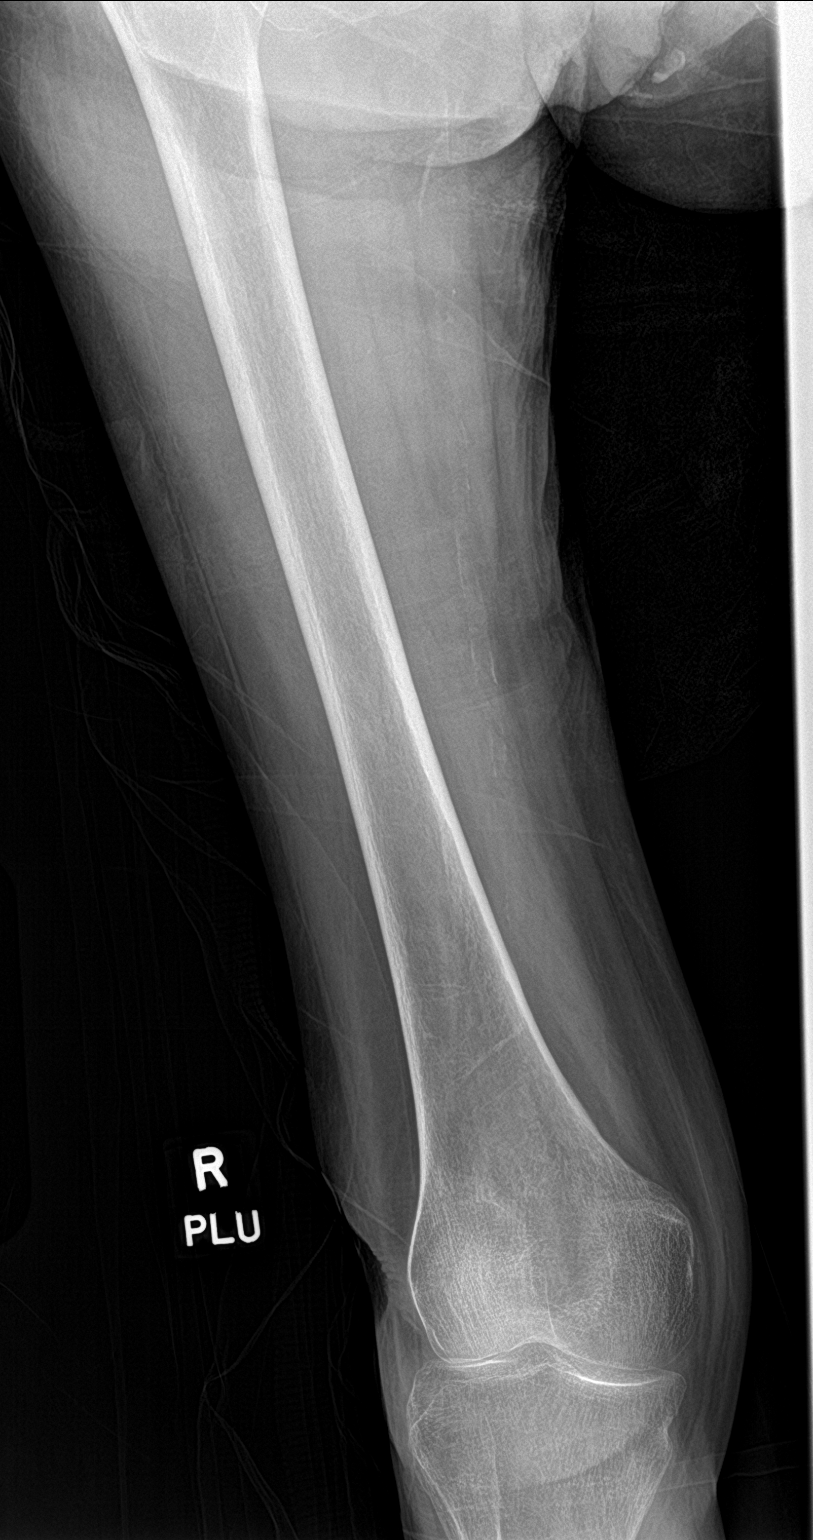

[femur ap (2 of 2)]
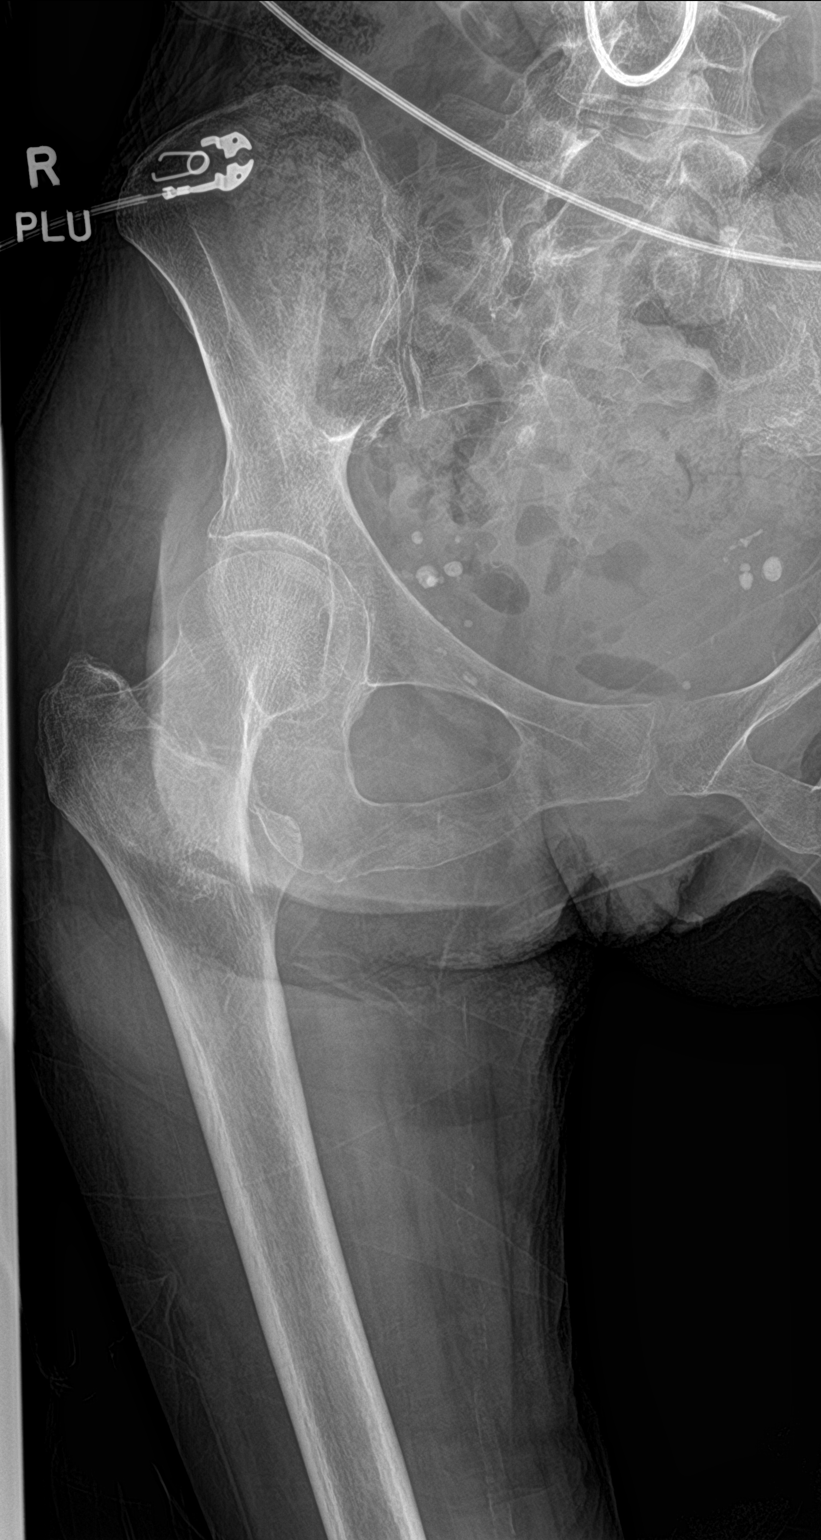

[femur lat (2 of 2)]
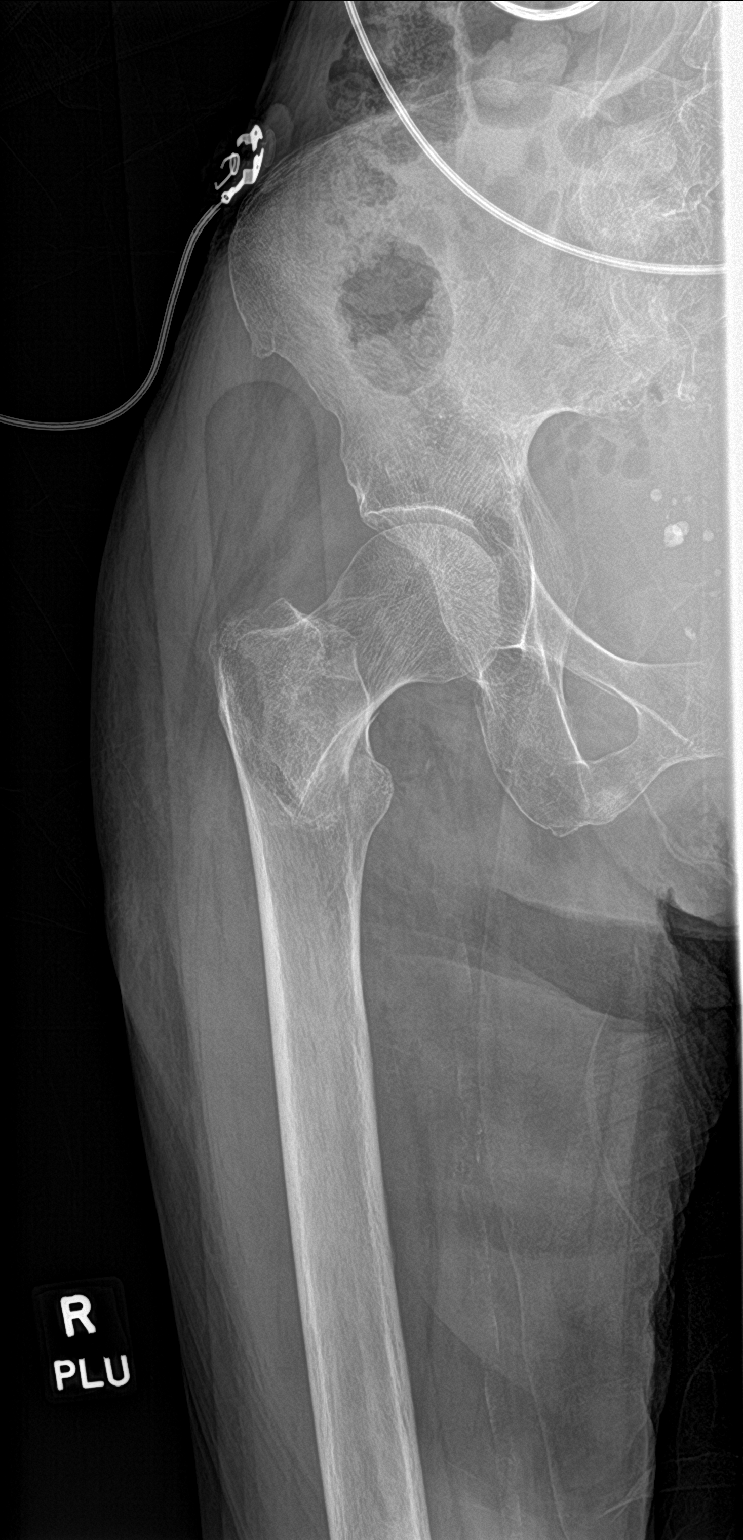

[4 of 4 positions shown; findings below may reference images not displayed]

FINDINGS: Non comminuted intertrochanteric fracture of the proximal right
femur, with no significant displacement, but with mild varus
angulation.

No other fractures.

Hip and knee joints are normally aligned.

Skeletal structures are demineralized.
IMPRESSION: 1. Non comminuted, nondisplaced intertrochanteric fracture of the
proximal right femur with mild varus angulation. No dislocation.

## 2021-11-07 IMAGING — CR DG FOREARM 2V*R*
2 series · 2 of 2 positions shown · non-contrast
Comparison: None.

CLINICAL DATA: Fall.  Right arm pain.

EXAM:
RIGHT FOREARM - 2 VIEW

[forearm ap]
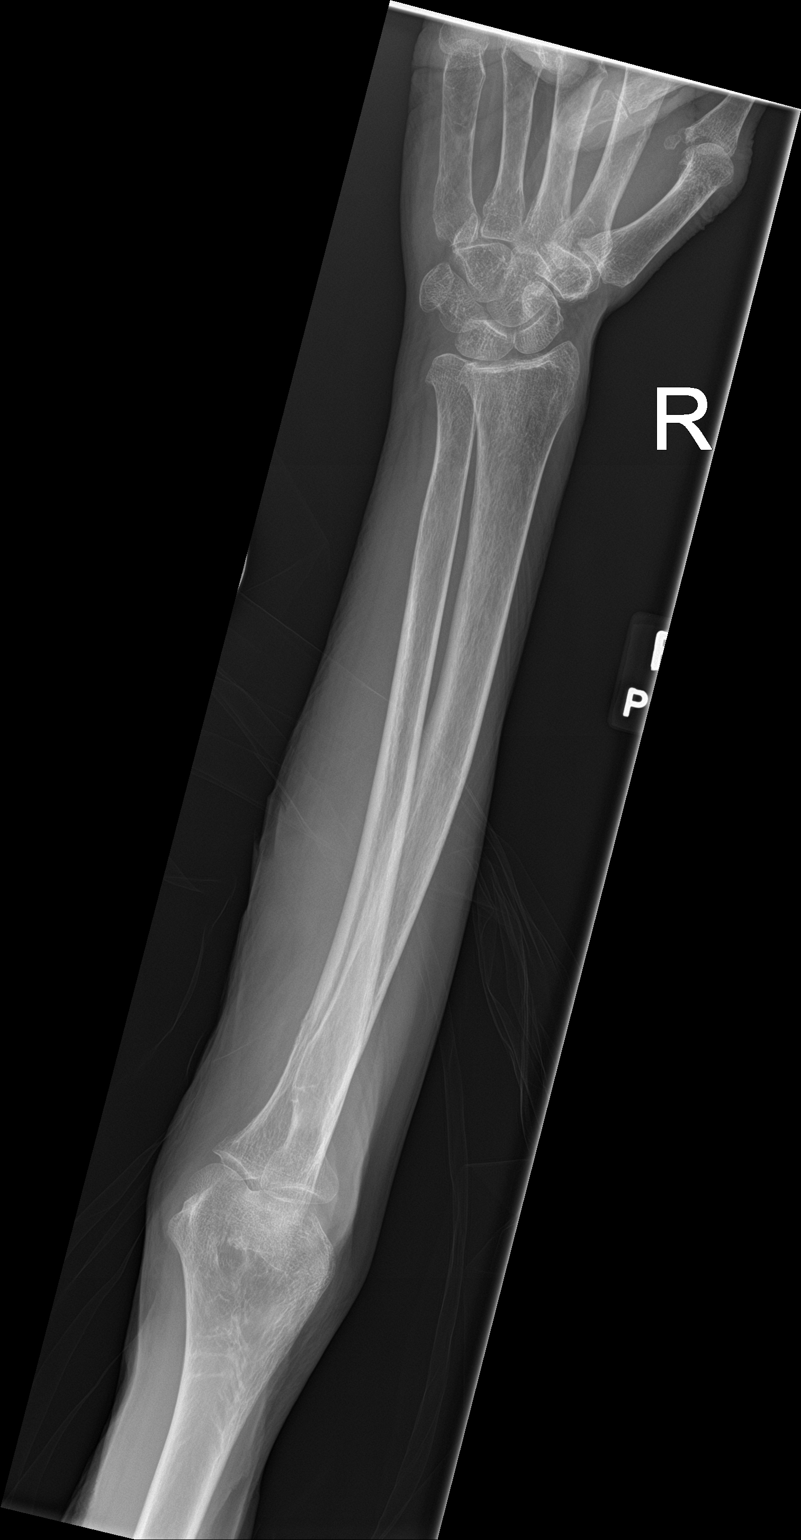

[forearm lat]
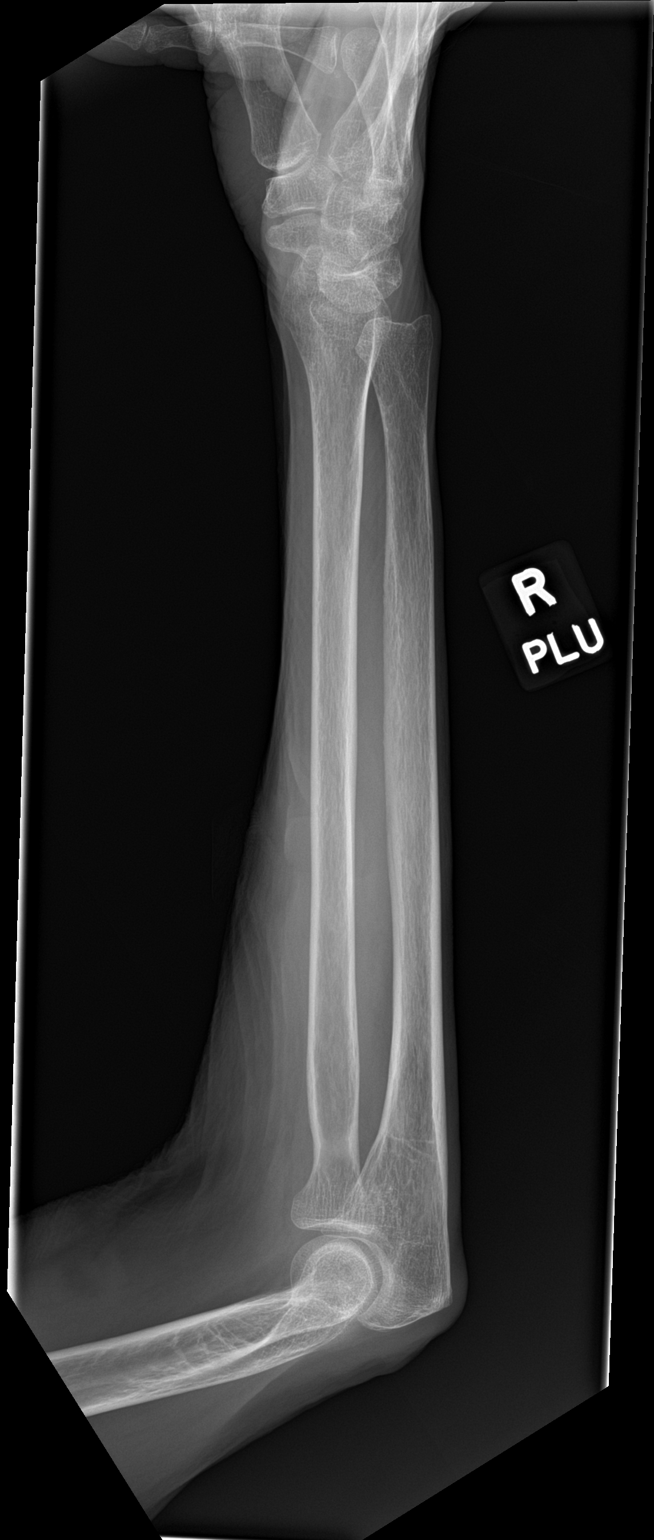

[2 of 2 positions shown; findings below may reference images not displayed]

FINDINGS: No fracture or bone lesion.

Elbow and wrist joints are normally spaced and aligned.

Soft tissues are unremarkable.
IMPRESSION: Negative.

## 2021-11-07 IMAGING — CR DG CHEST 2V
2 series · 2 of 2 positions shown · non-contrast
Comparison: Radiograph 12/27/2019, 11/17/2017, CT 04/16/2012

CLINICAL DATA: Fall, right hip pain

EXAM:
CHEST - 2 VIEW

[chest lat]
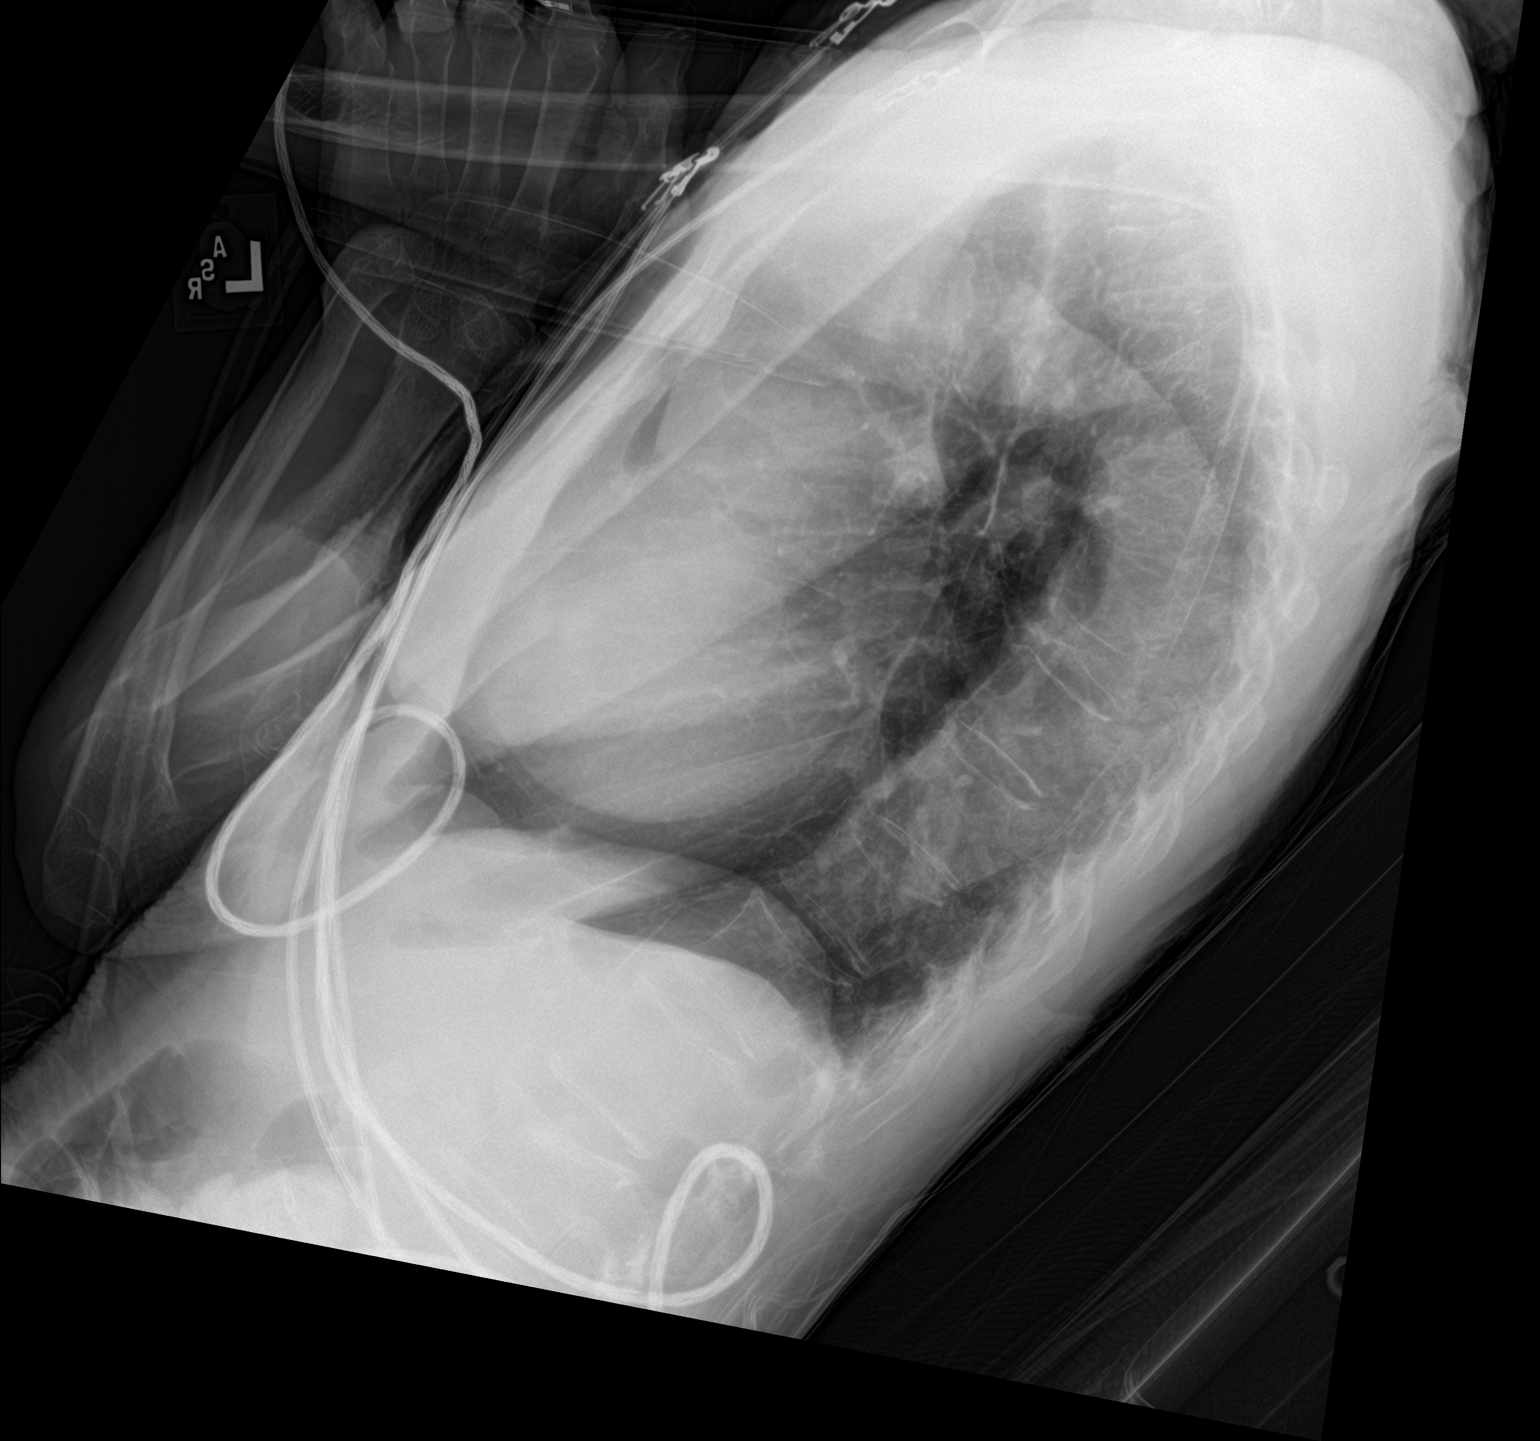

[chest ap]
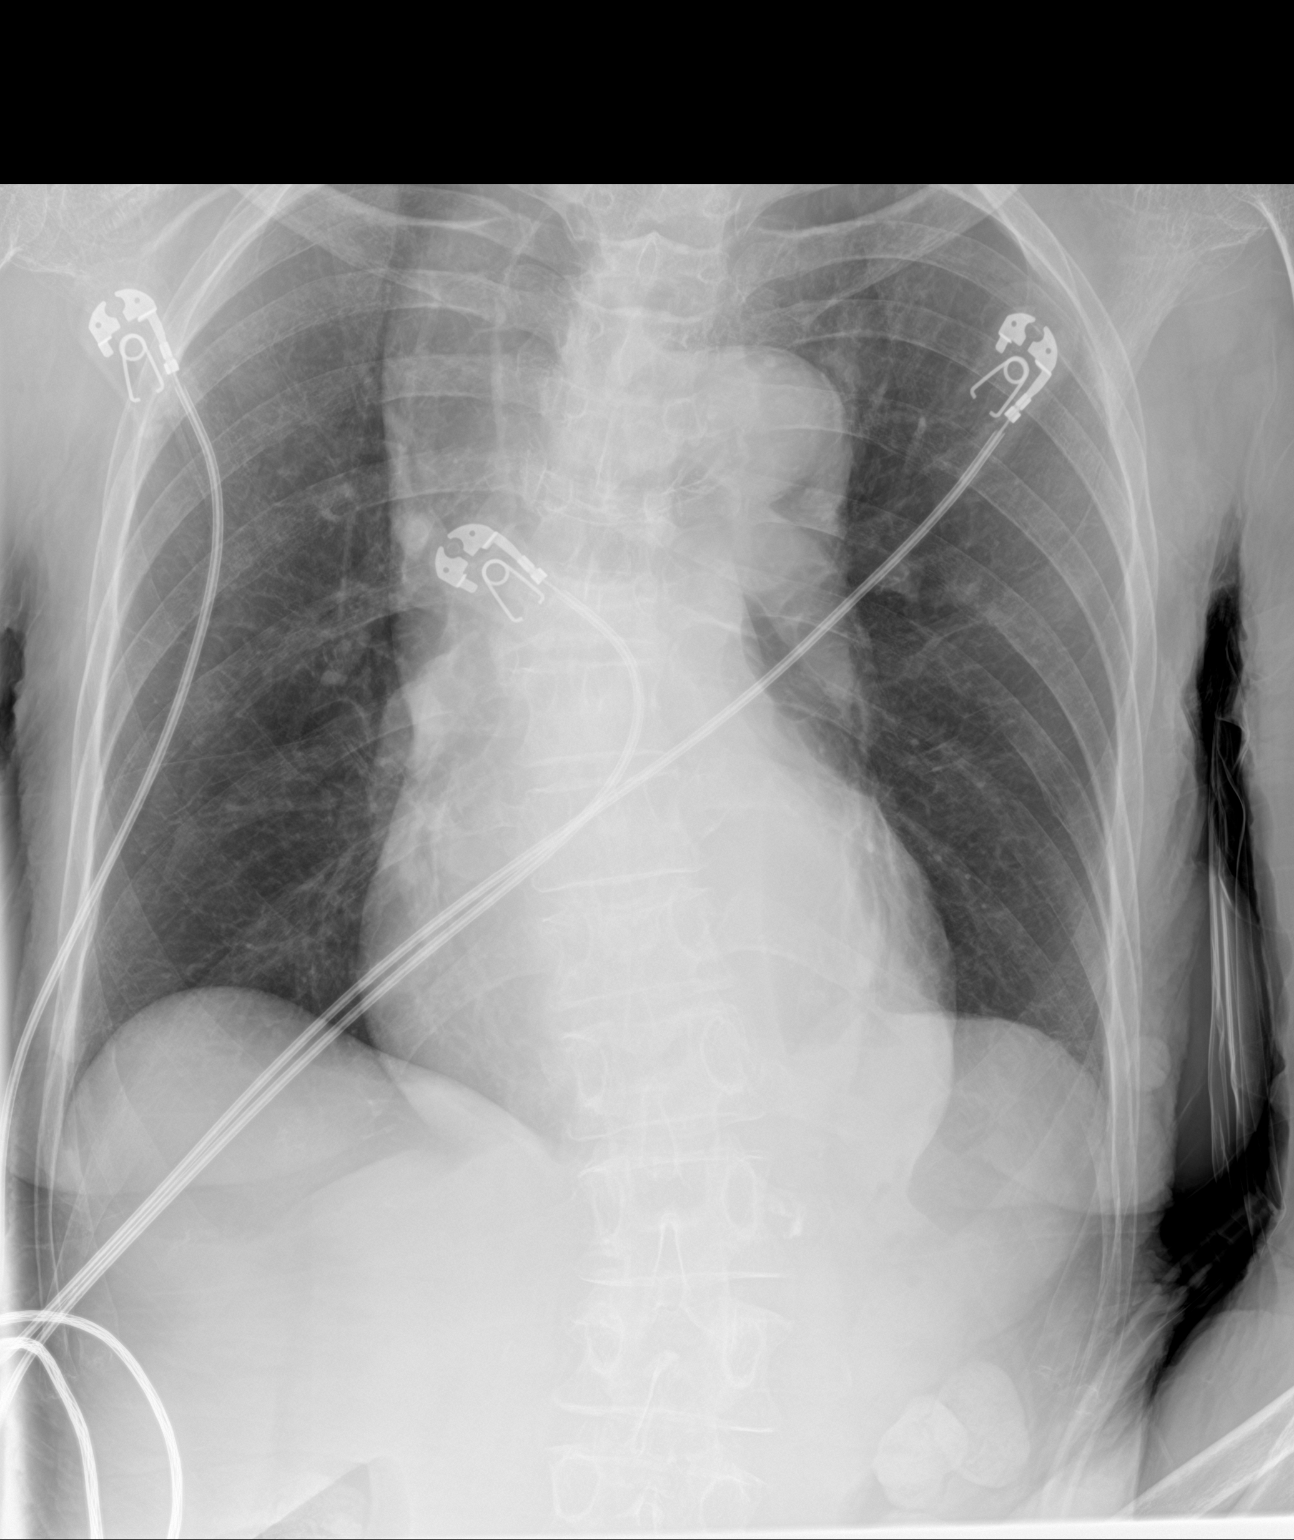

[2 of 2 positions shown; findings below may reference images not displayed]

FINDINGS: Mild cardiomegaly appearance with marked tortuosity of the
brachiocephalic vasculature and a dilated and calcified tortuous
aorta with some resulting rightward tracheal deviation which is
overall similar appearance to prior configuration. No acute
cardiomediastinal contour abnormalities are seen. No dense apical
capping or other features to suggest an acute intra mediastinal
process.

Some chronic hyperinflation and coarsened interstitial changes are
present in the lungs. No consolidation, features of edema,
pneumothorax, or effusion.

Interval progression of the midthoracic compression deformity is
overall age indeterminate. Questionable deformity of the right
radius and ulna though possibly projectional as these are
incidentally included on the lateral radiograph.
IMPRESSION: 1. No acute cardiopulmonary abnormality.
2. Chronic tortuosity and dilatation of aorta with rightward
tracheal deviation.
3. Interval progression of a previously seen midthoracic compression
deformity is overall age indeterminate. Correlate with point
tenderness.
4. Questionable deformity of the radius and ulna though possibly
projectional as these are incidentally included on the lateral
radiograph. Laterality uncertain. Correlate with clinical symptoms
and consider dedicated views as warranted.

## 2022-06-02 ENCOUNTER — Encounter: Payer: Self-pay | Admitting: Adult Health

## 2022-06-02 ENCOUNTER — Ambulatory Visit (INDEPENDENT_AMBULATORY_CARE_PROVIDER_SITE_OTHER): Payer: Medicare Other | Admitting: Adult Health

## 2022-06-02 VITALS — BP 142/85 | HR 65 | Ht 61.0 in | Wt 93.0 lb

## 2022-06-02 DIAGNOSIS — G40909 Epilepsy, unspecified, not intractable, without status epilepticus: Secondary | ICD-10-CM | POA: Diagnosis not present

## 2022-06-02 NOTE — Progress Notes (Signed)
PATIENT: [name] DOB: 1940/02/02  REASON FOR VISIT: follow up HISTORY FROM: patient  Chief Complaint  Patient presents with   Follow-up    Pt in 4 with life partner Pt states no seizures since last visit  Life partner states 2 falls since last office visit      HISTORY OF PRESENT ILLNESS:  Today 06/02/22:  [name] is a 82 y.o. female with a history of seizures. Returns today for follow-up.  She remains on Keppra 250 mg twice a day and Vimpat 200 mg twice a day.  Her life partner is with her today.  He reports that she not had any seizure events.  He does report that she has had 2 falls.  Fortunately no significant injuries.     HISTORY   06/02/22: [name] is a 82 year old female with a history of seizures. She returns today for follow-up. Life partner reports that she had tremors about 3 weeks ago. During the event she was talking coherantly. Reports that facility gave a muscle relaxer and that has helped. Denies any true seizure events as far as family knows. Continues to live at facility. Continues on Keppra 250 mg BID and Vimpat 200 mg BID.   02/10/21: [name] is a 82 year old female with a history of seizures.  She returns today for follow-up.  She is here today with her daughter and female friend.  She is currently on Keppra 250 mg twice a day.  This was recently reduced by her facility.  Unclear why it was reduced.  She remains on Vimpat 200 mg twice a day.  Her friend that is with her today states that she does not get up and move very much.  He feels that she needs another round of physical therapy.  Has asked that I make this recommendation to the facility.  Fortunately the patient has not had any seizure events.  She returns today for an evaluation.  08/04/20: [name] is a 82 year old female with a history of seizures.  She returns today for follow-up.  She had a seizure event on November 26 and was admitted to the hospital.  Her Keppra was  increased to 1000 mg twice a day and then had increased to 200 mg twice a day.  To the family's knowledge she has not had any additional seizure events.  She reports that a week ago she was in [place] and call for her daughter.  According to the family members daughter states that she was shaking but she was talking to her.  Unclear if this was a true seizure event.  The patient reports that she is not missed any seizure medication.  However the family is confused about what medication she should be taking for her blood pressure.  Blood pressure is elevated today.  They report that they have reached out to her primary care for an appointment.  09/30/19: [name] is a 82 year old female with a history of seizures.  She returns today for follow-up.  She remains on Keppra and Vimpat.  She denies any seizure events.  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  Denies any changes in her mood or behavior.  She returns today for an evaluation.  HISTORY 09/27/18:   [name] is a 82 year old female with a history of seizures.  She returns today for follow-up.  She remains on Keppra 500 mg daily and Vimpat 100 mg twice a day.  She reports that she  has been tolerating these medications well.  She does not have any seizure events.  Her blood pressure is elevated today.  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  She denies any new symptoms.  She returns today for evaluation.  REVIEW OF SYSTEMS: Out of a complete 14 system review of symptoms, the patient complains only of the following symptoms, and all other reviewed systems are negative.  See HPI  ALLERGIES: Allergies  Allergen Reactions   Banana     Raises K+   Budesonide-Formoterol Fumarate     Other reaction(s): Sore throat Other reaction(s): Sore throat   Fish Allergy    Lisinopril     Other reaction(s): nausea Other reaction(s): nausea   Mirtazapine     Other reaction(s): Severe Headaches Other reaction(s): Severe  Headaches   Sodium Bicarbonate     Other reaction(s): Makes her vomit Other reaction(s): Makes her vomit    HOME MEDICATIONS: Outpatient Medications Prior to Visit  Medication Sig Dispense Refill   acetaminophen (TYLENOL) 325 MG tablet Take 650 mg by mouth every 6 (six) hours as needed.     allopurinol (ZYLOPRIM) 100 MG tablet Take 1 tablet (100 mg total) by mouth daily. 30 tablet 0   amLODipine (NORVASC) 10 MG tablet Take 1 tablet (10 mg total) by mouth daily. 30 tablet 0   carvedilol (COREG) 12.5 MG tablet Take 1 tablet (12.5 mg total) by mouth 2 (two) times daily with a meal. 60 tablet 0   docusate sodium (COLACE) 100 MG capsule Take 1 capsule (100 mg total) by mouth 2 (two) times daily. 10 capsule 0   donepezil (ARICEPT) 5 MG tablet Take 1 tablet (5 mg total) by mouth daily. 30 tablet 0   Ensure (ENSURE) Take 237 mLs by mouth 3 (three) times daily between meals.     ferrous sulfate 325 (65 FE) MG tablet Take 325 mg by mouth daily.     lacosamide (VIMPAT) 200 MG TABS tablet Take 1 tablet (200 mg total) by mouth 2 (two) times daily. 60 tablet 0   levETIRAcetam (KEPPRA) 1000 MG tablet Take 1 tablet (1,000 mg total) by mouth 2 (two) times daily. (Patient taking differently: Take 250 mg by mouth 2 (two) times daily.) 60 tablet 0   losartan (COZAAR) 100 MG tablet Take 1 tablet (100 mg total) by mouth daily. 30 tablet 0   Multiple Vitamin (MULTIVITAMIN WITH MINERALS) TABS tablet Take 1 tablet by mouth daily.     No facility-administered medications prior to visit.    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   Basal ganglia  infarction (HCC) 01/08/2013   CAP (community acquired pneumonia)    Chest pain 05/14/2015   Chronic combined systolic and diastolic CHF (congestive heart failure) (HCC) 03/06/2014   Congestive heart failure (CHF) (HCC)    Constipation 02/24/2013   Cough    Diastolic dysfunction-grade 3 05/20/2015   Dyspnea 02/24/2013   Encephalopathy 12/28/2012   Epilepsy (HCC)    Generalized seizure (HCC) 12/28/2012   H/O: CVA (cerebrovascular accident) 01/04/2013   Hyperammonemia (HCC) 05/17/2015   Hyperglycemia 02/24/2013   Hyperkalemia 02/11/2013   Hypertension  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   Hyponatremia 02/11/2013   Influenza A 02/11/2013   Liver enzyme elevation 02/11/2013   Metabolic acidosis 12/28/2012   Metabolic encephalopathy 05/16/2015   NICM (nonischemic cardiomyopathy) -EF 25% 05/20/2015   Pleuritic chest pain 02/24/2013   PNA (pneumonia) 05/20/2015   Pneumobilia 02/12/2013   Pneumonia    Seizure (HCC) 12/28/2012   Seizures (HCC)    Smoker    Stroke (HCC)    Troponin level elevated 05/14/2015   Volume overload 02/24/2013   Weight gain 02/24/2013    PAST SURGICAL HISTORY: Past Surgical History:  Procedure Laterality Date   FEMUR IM NAIL Right 01/05/2020   INTRAMEDULLARY (IM) NAIL INTERTROCHANTERIC Right 01/05/2020   Procedure: INTRAMEDULLARY (IM) NAIL INTERTROCHANTRIC;  Surgeon: [name], MD;  Location: [facility] OR;  Service: Orthopedics;  Laterality: Right;   NO PAST SURGERIES     TOTAL HIP ARTHROPLASTY Left 03/29/2020   Procedure: TOTAL HIP ARTHROPLASTY ANTERIOR APPROACH;  Surgeon: Samson Frederic, MD;  Location: [facility];  Service: Orthopedics;  Laterality: Left;    FAMILY HISTORY: Family History  Problem Relation Age of Onset   Cancer Father    Anemia Daughter    Seizures Neg Hx     SOCIAL HISTORY: Social History   Socioeconomic History   Marital status: Significant Other    Spouse name: Selena Lesser   Number of children: 3   Years of education: 13    Highest education level: Not on file  Occupational History    Comment: Retired  Tobacco Use   Smoking status: Former    Packs/day: .25    Types: Cigarettes    Quit date: 01/31/2022    Years since quitting: 0.3   Smokeless tobacco: Never   Tobacco comments:    In three weeks she might have smoked 2 cigarettes (per significant other)  Vaping Use   Vaping Use: Never used  Substance and Sexual Activity   Alcohol use: Not Currently    Comment: occasional   Drug use: No   Sexual activity: Not Currently  Other Topics Concern   Not on file  Social History Narrative   Patient lives at home alone. (Update 08/04/2020 lives at [facility] [employer] [facility])   Retired.   Education one year of college.   Left handed.   Caffeine: maybe 3 cups a week   Social Determinants of Health   Financial Resource Strain: Not on file  Food Insecurity: Not on file  Transportation Needs: Not on file  Physical Activity: Not on file  Stress: Not on file  Social Connections: Not on file  Intimate Partner Violence: Not on file      PHYSICAL EXAM  Vitals:   06/02/22 1344  BP: (!) 142/85  Pulse: 65  Weight: 93 lb (42.2 kg)  Height: 5\' 1"  (1.549 m)    Body mass index is 17.57 kg/m.  Generalized:I n no acute distress   Neurological examination  Mentation: Alert oriented to time, place, history taking. Follows all commands speech and language fluent Cranial nerve II-XII: Pupils were equal round reactive to light. Extraocular movements were full, visual field were full on confrontational test. Head turning and shoulder shrug  were normal and symmetric. Motor: The motor testing reveals 5 over 5 strength of all 4 extremities. Contracture of right hand. Good symmetric motor tone is noted throughout.  Sensory: Sensory testing is intact to soft touch on all 4 extremities. No evidence of extinction is noted.  Coordination: Cerebellar testing reveals good  finger-nose-finger and heel-to-shin bilaterally.   Gait and station: Patient is in a wheelchair.    DIAGNOSTIC DATA (LABS, IMAGING, TESTING) - I reviewed patient records, labs, notes, testing and imaging myself where available.  Lab Results  Component Value Date   WBC 5.5 07/29/2020   HGB 9.8 (L) 07/29/2020   HCT 29.7 (L) 07/29/2020   MCV 100.0 07/29/2020   PLT 254 07/29/2020      Component Value Date/Time   NA 136 07/29/2020 0846   NA 135 (A) 05/23/2015 0000   K 4.6 07/29/2020 0846   CL 104 07/29/2020 0846   CO2 23 07/29/2020 0846   GLUCOSE 96 07/29/2020 0846   BUN 43 (H) 07/29/2020 0846   BUN 40 (A) 05/23/2015 0000   CREATININE 1.45 (H) 07/29/2020 0846   CREATININE 1.42 (H) 06/04/2015 1515   CALCIUM 9.9 07/29/2020 0846   PROT 7.9 07/21/2020 1935   ALBUMIN 3.9 07/21/2020 1935   AST 17 07/21/2020 1935   ALT 14 07/21/2020 1935   ALKPHOS 68 07/21/2020 1935   BILITOT 0.4 07/21/2020 1935   GFRNONAA 37 (L) 07/29/2020 0846   GFRAA 38 (L) 07/23/2018 1657   Lab Results  Component Value Date   CHOL 135 01/07/2020   HDL 41 01/07/2020   LDLCALC 45 01/07/2020   TRIG 247 (H) 01/07/2020   CHOLHDL 3.3 01/07/2020   Lab Results  Component Value Date   HGBA1C 5.4 12/28/2019   Lab Results  Component Value Date   VITAMINB12 601 03/28/2020   Lab Results  Component Value Date   TSH 1.765 05/16/2015      ASSESSMENT AND PLAN 82 y.o. year old female  has a past medical history of Abdominal pain (02/24/2013), Acute combined systolic and diastolic congestive heart failure (HCC) (02/24/2013), Acute on chronic renal insufficiency (05/15/2015), Acute renal failure (HCC) (12/28/2012), Acute renal failure superimposed on stage 3 chronic kidney disease (HCC), Anemia (05/20/2015), Ataxia, late effect of cerebrovascular disease (02/19/2013), Basal ganglia infarction (HCC) (01/08/2013), CAP (community acquired pneumonia), Chest pain (05/14/2015), Chronic combined systolic and diastolic CHF (congestive heart failure) (HCC) (03/06/2014), Congestive  heart failure (CHF) (HCC), Constipation (02/24/2013), Cough, Diastolic dysfunction-grade 3 (1/61/0960), Dyspnea (02/24/2013), Encephalopathy (12/28/2012), Epilepsy (HCC), Generalized seizure (HCC) (12/28/2012), H/O: CVA (cerebrovascular accident) (01/04/2013), Hyperammonemia (HCC) (05/17/2015), Hyperglycemia (02/24/2013), Hyperkalemia (02/11/2013), Hypertension, Hypertensive heart and kidney disease (12/28/2012), Hyponatremia (02/11/2013), Influenza A (02/11/2013), Liver enzyme elevation (02/11/2013), Metabolic acidosis (12/28/2012), Metabolic encephalopathy (05/16/2015), NICM (nonischemic cardiomyopathy) -EF 25% (05/20/2015), Pleuritic chest pain (02/24/2013), PNA (pneumonia) (05/20/2015), Pneumobilia (02/12/2013), Pneumonia, Seizure (HCC) (12/28/2012), Seizures (HCC), Smoker, Stroke (HCC), Troponin level elevated (05/14/2015), Volume overload (02/24/2013), and Weight gain (02/24/2013). here with:  Seizures  Continue Vimpat 200 mg twice a day  Keppra 250 mg BID Advised if she has any seizure event she should let us know Follow-up in 6 months or sooner if needed   Butch Penny, MSN, NP-C 06/02/2022, 1:46 PM [facility] [address] [place], [place] [address] [phone]

## 2022-06-02 NOTE — Patient Instructions (Signed)
Your Plan:  Continue Keppra and Vimpat  If your symptoms worsen or you develop new symptoms please let us know.   Thank you for coming to see us at [facility]. I hope we have been able to provide you high quality care today.  You may receive a patient satisfaction survey over the next few weeks. We would appreciate your feedback and comments so that we may continue to improve ourselves and the health of our patients.  

## 2023-06-08 ENCOUNTER — Ambulatory Visit: Payer: Medicare Other | Admitting: Adult Health
